# Patient Record
Sex: Male | Born: 1960 | Race: White | Hispanic: No | Smoking: Former smoker
Health system: Southern US, Community
[De-identification: ages and names within clinical notes are randomized; demographics above are authoritative.]

## PROBLEM LIST (undated history)

## (undated) DIAGNOSIS — Z9221 Personal history of antineoplastic chemotherapy: Secondary | ICD-10-CM

## (undated) DIAGNOSIS — J439 Emphysema, unspecified: Secondary | ICD-10-CM

## (undated) DIAGNOSIS — M1990 Unspecified osteoarthritis, unspecified site: Secondary | ICD-10-CM

## (undated) DIAGNOSIS — Z7183 Encounter for nonprocreative genetic counseling: Secondary | ICD-10-CM

## (undated) DIAGNOSIS — C801 Malignant (primary) neoplasm, unspecified: Secondary | ICD-10-CM

## (undated) DIAGNOSIS — J449 Chronic obstructive pulmonary disease, unspecified: Secondary | ICD-10-CM

## (undated) DIAGNOSIS — K219 Gastro-esophageal reflux disease without esophagitis: Secondary | ICD-10-CM

## (undated) DIAGNOSIS — R569 Unspecified convulsions: Secondary | ICD-10-CM

## (undated) DIAGNOSIS — G801 Spastic diplegic cerebral palsy: Secondary | ICD-10-CM

## (undated) DIAGNOSIS — J45909 Unspecified asthma, uncomplicated: Secondary | ICD-10-CM

## (undated) DIAGNOSIS — T7840XA Allergy, unspecified, initial encounter: Secondary | ICD-10-CM

## (undated) DIAGNOSIS — R1310 Dysphagia, unspecified: Secondary | ICD-10-CM

## (undated) DIAGNOSIS — J189 Pneumonia, unspecified organism: Secondary | ICD-10-CM

## (undated) DIAGNOSIS — R0600 Dyspnea, unspecified: Secondary | ICD-10-CM

## (undated) DIAGNOSIS — C01 Malignant neoplasm of base of tongue: Secondary | ICD-10-CM

## (undated) DIAGNOSIS — Z923 Personal history of irradiation: Secondary | ICD-10-CM

## (undated) DIAGNOSIS — G709 Myoneural disorder, unspecified: Secondary | ICD-10-CM

## (undated) DIAGNOSIS — I10 Essential (primary) hypertension: Secondary | ICD-10-CM

## (undated) HISTORY — DX: Personal history of antineoplastic chemotherapy: Z92.21

## (undated) HISTORY — DX: Essential (primary) hypertension: I10

## (undated) HISTORY — DX: Unspecified convulsions: R56.9

## (undated) HISTORY — DX: Allergy, unspecified, initial encounter: T78.40XA

## (undated) HISTORY — DX: Chronic obstructive pulmonary disease, unspecified: J44.9

## (undated) HISTORY — DX: Encounter for nonprocreative genetic counseling: Z71.83

## (undated) HISTORY — PX: JEJUNOSTOMY: SHX313

## (undated) HISTORY — DX: Personal history of irradiation: Z92.3

## (undated) HISTORY — DX: Spastic diplegic cerebral palsy: G80.1

## (undated) HISTORY — PX: UPPER GASTROINTESTINAL ENDOSCOPY: SHX188

## (undated) HISTORY — DX: Emphysema, unspecified: J43.9

## (undated) HISTORY — DX: Myoneural disorder, unspecified: G70.9

## (undated) HISTORY — DX: Unspecified asthma, uncomplicated: J45.909

## (undated) HISTORY — PX: COLONOSCOPY: SHX174

## (undated) HISTORY — PX: MOUTH SURGERY: SHX715

## (undated) HISTORY — DX: Unspecified osteoarthritis, unspecified site: M19.90

## (undated) HISTORY — PX: EYE SURGERY: SHX253

## (undated) HISTORY — DX: Malignant neoplasm of base of tongue: C01

## (undated) HISTORY — PX: POLYPECTOMY: SHX149

## (undated) MED FILL — Fosaprepitant Dimeglumine For IV Infusion 150 MG (Base Eq): INTRAVENOUS | Qty: 5 | Status: AC

---
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
## 2001-04-13 ENCOUNTER — Emergency Department (HOSPITAL_COMMUNITY): Admission: EM | Admit: 2001-04-13 | Discharge: 2001-04-13 | Payer: Self-pay | Admitting: *Deleted

## 2001-04-27 ENCOUNTER — Emergency Department (HOSPITAL_COMMUNITY): Admission: EM | Admit: 2001-04-27 | Discharge: 2001-04-27 | Payer: Self-pay | Admitting: Emergency Medicine

## 2012-11-01 ENCOUNTER — Encounter: Payer: Self-pay | Admitting: Family Medicine

## 2012-11-01 ENCOUNTER — Ambulatory Visit (INDEPENDENT_AMBULATORY_CARE_PROVIDER_SITE_OTHER): Payer: Medicaid Other | Admitting: Family Medicine

## 2012-11-01 VITALS — BP 130/80 | HR 78 | Temp 99.5°F | Resp 20 | Wt 145.0 lb

## 2012-11-01 DIAGNOSIS — R509 Fever, unspecified: Secondary | ICD-10-CM

## 2012-11-01 DIAGNOSIS — J45909 Unspecified asthma, uncomplicated: Secondary | ICD-10-CM

## 2012-11-01 DIAGNOSIS — J09X2 Influenza due to identified novel influenza A virus with other respiratory manifestations: Secondary | ICD-10-CM

## 2012-11-01 DIAGNOSIS — F17200 Nicotine dependence, unspecified, uncomplicated: Secondary | ICD-10-CM | POA: Insufficient documentation

## 2012-11-01 LAB — INFLUENZA A AND B: Inflenza A Ag: POSITIVE — AB

## 2012-11-01 MED ORDER — OSELTAMIVIR PHOSPHATE 75 MG PO CAPS: 75.0000 mg | ORAL_CAPSULE | Freq: Two times a day (BID) | ORAL | Status: DC

## 2012-11-01 MED ORDER — ALBUTEROL SULFATE HFA 108 (90 BASE) MCG/ACT IN AERS: 2.0000 | INHALATION_SPRAY | Freq: Four times a day (QID) | RESPIRATORY_TRACT | Status: DC | PRN

## 2012-11-01 NOTE — Progress Notes (Signed)
Subjective:     Patient ID: Darrell Cunningham, male   DOB: 24-Feb-1961, 52 y.o.   MRN: [id number]  HPI 6 day history of fever to 100 101, diffuse myalgias, cough, congestion, shortness of breath. He also reports rhinorrhea and headaches. He denies sore throat. He denies nausea vomiting or diarrhea.  Review of Systems  Constitutional: Positive for fever, chills, appetite change and fatigue.  HENT: Positive for congestion and rhinorrhea. Negative for ear pain.   Respiratory: Positive for cough, chest tightness, shortness of breath and wheezing.   Cardiovascular: Negative.   Gastrointestinal: Negative.   Genitourinary: Negative.        Objective:   Physical Exam  Constitutional: He appears well-developed and well-nourished.  HENT:  Head: Normocephalic.  Right Ear: External ear normal.  Left Ear: External ear normal.  Mouth/Throat: Oropharynx is clear and moist.  Neck: Normal range of motion. Neck supple.  Cardiovascular: Normal rate and normal heart sounds.   Pulmonary/Chest: Effort normal. No accessory muscle usage. Not tachypneic. No respiratory distress. He has decreased breath sounds in the right lower field and the left lower field. He has wheezes in the right upper field, the right middle field, the right lower field, the left upper field, the left middle field and the left lower field. He has rhonchi in the right lower field.       Assessment:     1. Fever, unspecified Please draw positive for influenza type A. - Influenza a and b  2. Influenza due to identified novel influenza A virus with other respiratory manifestations - oseltamivir (TAMIFLU) 75 MG capsule; Take 1 capsule (75 mg total) by mouth 2 (two) times daily.  Dispense: 10 capsule; Refill: 0  3. Reactive airway disease Encourage smoking cessation. His wheezing gets worse may need oral steroids. - albuterol (PROVENTIL HFA;VENTOLIN HFA) 108 (90 BASE) MCG/ACT inhaler; Inhale 2 puffs into the lungs every 6 (six)  hours as needed for wheezing.  Dispense: 1 Inhaler; Refill: 0    Plan:     Patient is instructed to return immediately if symptoms worsen due to concerns about evolving pneumonia. He may also need oral corticosteroids if his wheezing worsens.

## 2014-05-18 ENCOUNTER — Ambulatory Visit (INDEPENDENT_AMBULATORY_CARE_PROVIDER_SITE_OTHER): Payer: Medicaid Other | Admitting: Family Medicine

## 2014-05-18 ENCOUNTER — Encounter: Payer: Self-pay | Admitting: Family Medicine

## 2014-05-18 VITALS — BP 146/84 | HR 84 | Temp 97.6°F | Resp 18 | Ht 61.0 in | Wt 161.0 lb

## 2014-05-18 DIAGNOSIS — M545 Low back pain, unspecified: Secondary | ICD-10-CM

## 2014-05-18 MED ORDER — DICLOFENAC SODIUM 75 MG PO TBEC: 75.0000 mg | DELAYED_RELEASE_TABLET | Freq: Two times a day (BID) | ORAL | Status: DC

## 2014-05-18 MED ORDER — TIZANIDINE HCL 4 MG PO CAPS: 4.0000 mg | ORAL_CAPSULE | Freq: Three times a day (TID) | ORAL | Status: DC | PRN

## 2014-05-18 NOTE — Progress Notes (Signed)
   Subjective:    Patient ID: Darrell Cunningham, male    DOB: 01-02-1961, 53 y.o.   MRN: [id number]  HPI Patient is a pleasant 53 year old white male who injured his back 2 weeks ago while shoveling mulch.  He is located over his left lower back just above his gluteus. The pain does not radiate. It stays in that area. It is exacerbated by prolonged standing or walking. He denies any lumbar radiculopathy or weakness or numbness in his legs. He denies any symptoms of cauda equina syndrome. Past Medical History  Diagnosis Date  . Smoker   . Asthma     as a child   No past surgical history on file. Current Outpatient Prescriptions on File Prior to Visit  Medication Sig Dispense Refill  . ibuprofen (ADVIL,MOTRIN) 200 MG tablet Take 200 mg by mouth every 6 (six) hours as needed for pain.       No current facility-administered medications on file prior to visit.   Allergies  Allergen Reactions  . Penicillins     Childhood rxn   History   Social History  . Marital Status: Married    Spouse Name: N/A    Number of Children: N/A  . Years of Education: N/A   Occupational History  . Not on file.   Social History Main Topics  . Smoking status: Former Smoker    Types: Cigarettes    Start date: 10/31/2012  . Smokeless tobacco: Not on file  . Alcohol Use: No  . Drug Use: No  . Sexual Activity: Not on file   Other Topics Concern  . Not on file   Social History Narrative  . No narrative on file      Review of Systems  All other systems reviewed and are negative.      Objective:   Physical Exam  Vitals reviewed. Cardiovascular: Normal rate, regular rhythm and normal heart sounds.   Pulmonary/Chest: Effort normal and breath sounds normal.  Musculoskeletal:       Lumbar back: He exhibits tenderness, pain and spasm. He exhibits normal range of motion and no bony tenderness.  Neurological: He has normal strength.  Reflex Scores:      Patellar reflexes are 2+ on the right  side and 2+ on the left side.      Achilles reflexes are 2+ on the right side and 2+ on the left side.         Assessment & Plan:  Midline low back pain without sciatica - Plan: diclofenac (VOLTAREN) 75 MG EC tablet, tiZANidine (ZANAFLEX) 4 MG capsule  Patient has a muscle strain in his lower back. I anticipate spontaneous resolution in 2 more weeks. Take diclofenac 75 mg by mouth twice a day for inflammation, and Zanaflex 4 mg every 8 hours as needed for muscle spasms. Recheck in 2 weeks if no better or sooner if worse.

## 2014-06-17 ENCOUNTER — Other Ambulatory Visit: Payer: Medicaid Other

## 2014-06-17 ENCOUNTER — Other Ambulatory Visit: Payer: Self-pay | Admitting: Family Medicine

## 2014-06-17 DIAGNOSIS — Z0000 Encounter for general adult medical examination without abnormal findings: Secondary | ICD-10-CM

## 2014-06-17 LAB — COMPREHENSIVE METABOLIC PANEL
ALBUMIN: 4.3 g/dL (ref 3.5–5.2)
ALK PHOS: 69 U/L (ref 39–117)
ALT: 42 U/L (ref 0–53)
AST: 23 U/L (ref 0–37)
BUN: 12 mg/dL (ref 6–23)
CALCIUM: 9.2 mg/dL (ref 8.4–10.5)
CHLORIDE: 102 meq/L (ref 96–112)
CO2: 27 mEq/L (ref 19–32)
Creat: 0.84 mg/dL (ref 0.50–1.35)
Glucose, Bld: 121 mg/dL — ABNORMAL HIGH (ref 70–99)
POTASSIUM: 4.5 meq/L (ref 3.5–5.3)
SODIUM: 136 meq/L (ref 135–145)
TOTAL PROTEIN: 7.1 g/dL (ref 6.0–8.3)
Total Bilirubin: 0.4 mg/dL (ref 0.2–1.2)

## 2014-06-17 LAB — LIPID PANEL
Cholesterol: 237 mg/dL — ABNORMAL HIGH (ref 0–200)
HDL: 38 mg/dL — AB (ref >39)
LDL CALC: 168 mg/dL — AB (ref 0–99)
Total CHOL/HDL Ratio: 6.2 Ratio
Triglycerides: 153 mg/dL — ABNORMAL HIGH (ref <150)
VLDL: 31 mg/dL (ref 0–40)

## 2014-06-17 LAB — CBC WITH DIFFERENTIAL/PLATELET
BASOS ABS: 0 10*3/uL (ref 0.0–0.1)
BASOS PCT: 0 % (ref 0–1)
Eosinophils Absolute: 0.2 10*3/uL (ref 0.0–0.7)
Eosinophils Relative: 3 % (ref 0–5)
HCT: 40.7 % (ref 39.0–52.0)
Hemoglobin: 13.8 g/dL (ref 13.0–17.0)
LYMPHS PCT: 31 % (ref 12–46)
Lymphs Abs: 2 10*3/uL (ref 0.7–4.0)
MCH: 30.3 pg (ref 26.0–34.0)
MCHC: 33.9 g/dL (ref 30.0–36.0)
MCV: 89.5 fL (ref 78.0–100.0)
MONO ABS: 0.5 10*3/uL (ref 0.1–1.0)
MPV: 10.3 fL (ref 9.4–12.4)
Monocytes Relative: 8 % (ref 3–12)
NEUTROS ABS: 3.8 10*3/uL (ref 1.7–7.7)
NEUTROS PCT: 58 % (ref 43–77)
PLATELETS: 222 10*3/uL (ref 150–400)
RBC: 4.55 MIL/uL (ref 4.22–5.81)
RDW: 13.4 % (ref 11.5–15.5)
WBC: 6.6 10*3/uL (ref 4.0–10.5)

## 2014-06-18 LAB — PSA: PSA: 0.41 ng/mL (ref <=4.00)

## 2014-06-19 LAB — HEMOGLOBIN A1C
Hgb A1c MFr Bld: 5.8 % — ABNORMAL HIGH (ref <5.7)
MEAN PLASMA GLUCOSE: 120 mg/dL — AB (ref <117)

## 2014-06-29 ENCOUNTER — Ambulatory Visit (INDEPENDENT_AMBULATORY_CARE_PROVIDER_SITE_OTHER): Payer: Medicaid Other | Admitting: Family Medicine

## 2014-06-29 ENCOUNTER — Encounter: Payer: Self-pay | Admitting: Family Medicine

## 2014-06-29 VITALS — BP 126/70 | HR 84 | Temp 98.0°F | Resp 16 | Ht 61.0 in | Wt 163.0 lb

## 2014-06-29 DIAGNOSIS — R0609 Other forms of dyspnea: Secondary | ICD-10-CM

## 2014-06-29 DIAGNOSIS — Z0000 Encounter for general adult medical examination without abnormal findings: Secondary | ICD-10-CM

## 2014-06-29 DIAGNOSIS — Z23 Encounter for immunization: Secondary | ICD-10-CM

## 2014-06-29 DIAGNOSIS — R7303 Prediabetes: Secondary | ICD-10-CM | POA: Insufficient documentation

## 2014-06-29 MED ORDER — ATORVASTATIN CALCIUM 20 MG PO TABS: 20.0000 mg | ORAL_TABLET | Freq: Every day | ORAL | Status: DC

## 2014-06-29 NOTE — Addendum Note (Signed)
Addended by: Shary Decamp B on: 06/29/2014 02:54 PM   Modules accepted: Orders

## 2014-06-29 NOTE — Progress Notes (Signed)
Subjective:    Patient ID: Darrell Cunningham, male    DOB: 1960-08-17, 53 y.o.   MRN: [id number]  HPI Patient is a very pleasant 53 year old white male who presents today for complete physical exam. Patient has a history of smoking as well as asthma. He quit smoking in 2014. However he still complains of mild dyspnea on exertion. In the past, the patient had wheezing audible on exam. I'm concerned he may have COPD. Patient has never had an EKG. He denies any angina or chest pain. He is overdue for a colonoscopy as well as a prostate exam. He is due for his flu shot. His lab work as listed below and is significant for a hyperglycemia as well as hyperlipidemia: Lab on 06/17/2014  Component Date Value Ref Range Status  . WBC 06/17/2014 6.6  4.0 - 10.5 K/uL Final  . RBC 06/17/2014 4.55  4.22 - 5.81 MIL/uL Final  . Hemoglobin 06/17/2014 13.8  13.0 - 17.0 g/dL Final  . HCT 06/17/2014 40.7  39.0 - 52.0 % Final  . MCV 06/17/2014 89.5  78.0 - 100.0 fL Final  . MCH 06/17/2014 30.3  26.0 - 34.0 pg Final  . MCHC 06/17/2014 33.9  30.0 - 36.0 g/dL Final  . RDW 06/17/2014 13.4  11.5 - 15.5 % Final  . Platelets 06/17/2014 222  150 - 400 K/uL Final  . MPV 06/17/2014 10.3  9.4 - 12.4 fL Final  . Neutrophils Relative % 06/17/2014 58  43 - 77 % Final  . Neutro Abs 06/17/2014 3.8  1.7 - 7.7 K/uL Final  . Lymphocytes Relative 06/17/2014 31  12 - 46 % Final  . Lymphs Abs 06/17/2014 2.0  0.7 - 4.0 K/uL Final  . Monocytes Relative 06/17/2014 8  3 - 12 % Final  . Monocytes Absolute 06/17/2014 0.5  0.1 - 1.0 K/uL Final  . Eosinophils Relative 06/17/2014 3  0 - 5 % Final  . Eosinophils Absolute 06/17/2014 0.2  0.0 - 0.7 K/uL Final  . Basophils Relative 06/17/2014 0  0 - 1 % Final  . Basophils Absolute 06/17/2014 0.0  0.0 - 0.1 K/uL Final  . Smear Review 06/17/2014 Criteria for review not met   Final  . Sodium 06/17/2014 136  135 - 145 mEq/L Final  . Potassium 06/17/2014 4.5  3.5 - 5.3 mEq/L Final  . Chloride  06/17/2014 102  96 - 112 mEq/L Final  . CO2 06/17/2014 27  19 - 32 mEq/L Final  . Glucose, Bld 06/17/2014 121* 70 - 99 mg/dL Final  . BUN 06/17/2014 12  6 - 23 mg/dL Final  . Creat 06/17/2014 0.84  0.50 - 1.35 mg/dL Final  . Total Bilirubin 06/17/2014 0.4  0.2 - 1.2 mg/dL Final  . Alkaline Phosphatase 06/17/2014 69  39 - 117 U/L Final  . AST 06/17/2014 23  0 - 37 U/L Final  . ALT 06/17/2014 42  0 - 53 U/L Final  . Total Protein 06/17/2014 7.1  6.0 - 8.3 g/dL Final  . Albumin 06/17/2014 4.3  3.5 - 5.2 g/dL Final  . Calcium 06/17/2014 9.2  8.4 - 10.5 mg/dL Final  . Cholesterol 06/17/2014 237* 0 - 200 mg/dL Final   Comment: ATP III Classification:       < 200        mg/dL        Desirable      200 - 239     mg/dL        Borderline  High      >= 240        mg/dL        High     . Triglycerides 06/17/2014 153* <150 mg/dL Final  . HDL 06/17/2014 38* >39 mg/dL Final  . Total CHOL/HDL Ratio 06/17/2014 6.2   Final  . VLDL 06/17/2014 31  0 - 40 mg/dL Final  . LDL Cholesterol 06/17/2014 168* 0 - 99 mg/dL Final   Comment:   Total Cholesterol/HDL Ratio:CHD Risk                        Coronary Heart Disease Risk Table                                        Men       Women          1/2 Average Risk              3.4        3.3              Average Risk              5.0        4.4           2X Average Risk              9.6        7.1           3X Average Risk             23.4       11.0 Use the calculated Patient Ratio above and the CHD Risk table  to determine the patient's CHD Risk. ATP III Classification (LDL):       < 100        mg/dL         Optimal      100 - 129     mg/dL         Near or Above Optimal      130 - 159     mg/dL         Borderline High      160 - 189     mg/dL         High       > 190        mg/dL         Very High     . PSA 06/17/2014 0.41  <=4.00 ng/mL Final   Comment: Test Methodology: ECLIA PSA (Electrochemiluminescence Immunoassay)   For PSA values from 2.5-4.0,  particularly in younger men <76 years old, the AUA and NCCN suggest testing for % Free PSA (3515) and evaluation of the rate of increase in PSA (PSA velocity).   Orders Only on 06/17/2014  Component Date Value Ref Range Status  . Hgb A1c MFr Bld 06/17/2014 5.8* <5.7 % Final   Comment:                                                                        According to the  ADA Clinical Practice Recommendations for 2011, when HbA1c is used as a screening test:     >=6.5%   Diagnostic of Diabetes Mellitus            (if abnormal result is confirmed)   5.7-6.4%   Increased risk of developing Diabetes Mellitus   References:Diagnosis and Classification of Diabetes Mellitus,Diabetes GYKZ,9935,70(VXBLT 1):S62-S69 and Standards of Medical Care in         Diabetes - 2011,Diabetes JQZE,0923,30 (Suppl 1):S11-S61.     . Mean Plasma Glucose 06/17/2014 120* <117 mg/dL Final   Past Medical History  Diagnosis Date  . Smoker     quit 4/14  . Asthma     as a child  . CP (cerebral palsy), spastic     right  . Allergy   . Prediabetes   . Hyperlipidemia    No past surgical history on file. No current outpatient prescriptions on file prior to visit.   No current facility-administered medications on file prior to visit.   Allergies  Allergen Reactions  . Penicillins     Childhood rxn   History   Social History  . Marital Status: Married    Spouse Name: N/A    Number of Children: N/A  . Years of Education: N/A   Occupational History  . Not on file.   Social History Main Topics  . Smoking status: Former Smoker    Types: Cigarettes    Start date: 10/31/2012  . Smokeless tobacco: Not on file  . Alcohol Use: No  . Drug Use: No  . Sexual Activity: Not on file   Other Topics Concern  . Not on file   Social History Narrative   No family history on file.    Review of Systems  All other systems reviewed and are negative.      Objective:   Physical Exam  Constitutional:  He is oriented to person, place, and time. He appears well-developed and well-nourished. No distress.  HENT:  Head: Normocephalic and atraumatic.  Right Ear: External ear normal.  Left Ear: External ear normal.  Nose: Nose normal.  Mouth/Throat: Oropharynx is clear and moist. No oropharyngeal exudate.  Eyes: Conjunctivae and EOM are normal. Pupils are equal, round, and reactive to light. Right eye exhibits no discharge. Left eye exhibits no discharge. No scleral icterus.  Neck: Normal range of motion. Neck supple. No JVD present. No tracheal deviation present. No thyromegaly present.  Cardiovascular: Normal rate, regular rhythm, normal heart sounds and intact distal pulses.  Exam reveals no gallop and no friction rub.   No murmur heard. Pulmonary/Chest: Effort normal and breath sounds normal. No stridor.  Abdominal: Soft. Bowel sounds are normal. He exhibits no distension and no mass. There is no tenderness. There is no rebound and no guarding.  Musculoskeletal: Normal range of motion. He exhibits no edema or tenderness.  Lymphadenopathy:    He has no cervical adenopathy.  Neurological: He is alert and oriented to person, place, and time. He has normal reflexes. He displays normal reflexes. No cranial nerve deficit. He exhibits abnormal muscle tone. Coordination normal.  Skin: Skin is warm. No rash noted. He is not diaphoretic. No erythema. No pallor.  Psychiatric: He has a normal mood and affect. His behavior is normal. Judgment and thought content normal.  Vitals reviewed.  patient has right eye exotropia, and mild spastic weakness in his right upper extremity and right lower extremity due to his cerebral palsy  Assessment & Plan:  Routine general medical examination at a health care facility - Plan: Ambulatory referral to Gastroenterology  Dyspnea on exertion  Patient's physical exam is significant for his weight, his hyperglycemia, and his hyperlipidemia. I will perform an  EKG as well as pulmonary function test to evaluate his dyspnea on exertion. I recommended Lipitor 20 mg by mouth daily for hyperlipidemia. Recheck fasting lipid panel in 3 months. I recommended daily aerobic exercise goal 30 minutes a day as well as 10-15 pounds of weight loss to help address his hyperglycemia. I recommended a low carbohydrate diet. I will consult gastroenterology for screening colonoscopy. Patient received his flu shot today.  Pulmonary function tests reveal an FEV1 to FVC ratio of 76% with an FEV1 of 2.17 L which is 99% of predicted. This indicates normal spirometry with no evidence of COPD. EKG shows no evidence of ischemia or infarction. Patient is in normal sinus rhythm with normal intervals and normal axis. I gave the patient information on a low carbohydrate diet. I recommended exercise and diet to lose weight. Recheck cholesterol and lab work in 3 months.

## 2014-07-16 ENCOUNTER — Encounter: Payer: Self-pay | Admitting: Internal Medicine

## 2014-10-05 ENCOUNTER — Ambulatory Visit (AMBULATORY_SURGERY_CENTER): Payer: Self-pay

## 2014-10-05 VITALS — Ht 60.0 in | Wt 156.0 lb

## 2014-10-05 DIAGNOSIS — Z1211 Encounter for screening for malignant neoplasm of colon: Secondary | ICD-10-CM

## 2014-10-05 MED ORDER — MOVIPREP 100 G PO SOLR: 1.0000 | Freq: Once | ORAL | Status: DC

## 2014-10-05 NOTE — Progress Notes (Signed)
No allergies to eggs or soy No home oxygen No diet/weight loss meds No exposure to anesthesia  Has email  Emmi instructions given for colonoscopy

## 2014-10-19 ENCOUNTER — Encounter: Payer: Self-pay | Admitting: Internal Medicine

## 2014-10-19 ENCOUNTER — Ambulatory Visit (AMBULATORY_SURGERY_CENTER): Payer: Medicaid Other | Admitting: Internal Medicine

## 2014-10-19 VITALS — BP 112/72 | HR 70 | Temp 97.9°F | Resp 15 | Ht 60.0 in | Wt 156.0 lb

## 2014-10-19 DIAGNOSIS — D120 Benign neoplasm of cecum: Secondary | ICD-10-CM | POA: Diagnosis not present

## 2014-10-19 DIAGNOSIS — Z1211 Encounter for screening for malignant neoplasm of colon: Secondary | ICD-10-CM

## 2014-10-19 DIAGNOSIS — D124 Benign neoplasm of descending colon: Secondary | ICD-10-CM | POA: Diagnosis not present

## 2014-10-19 MED ORDER — SODIUM CHLORIDE 0.9 % IV SOLN: 500.0000 mL | INTRAVENOUS | Status: DC

## 2014-10-19 NOTE — Progress Notes (Signed)
A/ox3, pleased with MAC, report to RN 

## 2014-10-19 NOTE — Progress Notes (Signed)
Called to room to assist during endoscopic procedure.  Patient ID and intended procedure confirmed with present staff. Received instructions for my participation in the procedure from the performing physician.  

## 2014-10-19 NOTE — Op Note (Signed)
[place]  [address] [place], [phone]   COLONOSCOPY PROCEDURE REPORT  PATIENT: Darrell Cunningham, Darrell Cunningham  MR#: [id number] BIRTHDATE: 1961/01/21 , 53  yrs. old GENDER: male ENDOSCOPIST: [name], MD REFERRED MV:[id number] Dennard Schaumann, M.D. PROCEDURE DATE:  10/19/2014 PROCEDURE:   Colonoscopy with snare polypectomy First Screening Colonoscopy - Avg.  risk and is 50 yrs.  old or older Yes.  Prior Negative Screening - Now for repeat screening. N/A  History of Adenoma - Now for follow-up colonoscopy & has been > or = to 3 yrs.  N/A ASA CLASS:   Class II INDICATIONS:Screening for colonic neoplasia and Colorectal Neoplasm Risk Assessment for this procedure is average risk. MEDICATIONS: Monitored anesthesia care and Propofol 250 mg IV  DESCRIPTION OF PROCEDURE:   After the risks benefits and alternatives of the procedure were thoroughly explained, informed consent was obtained.  The digital rectal exam revealed no rectal mass.   The [id number]  endoscope was introduced through the anus and advanced to the terminal ileum which was intubated for a short distance. No adverse events experienced.   The quality of the prep was (MoviPrep was used) good.  The instrument was then slowly withdrawn as the colon was fully examined.   COLON FINDINGS: The examined terminal ileum appeared to be normal. Two sessile polyps ranging from 4 to 66mm in size were found at the cecum and in the descending colon.  Polypectomies were performed with a cold snare.  The resection was complete, the polyp tissue was completely retrieved and sent to histology.   There was mild diverticulosis noted in the left colon.  Retroflexed views revealed no abnormalities. The time to cecum = 4.3 Withdrawal time = 11.4 The scope was withdrawn and the procedure completed. COMPLICATIONS: There were no immediate complications.  ENDOSCOPIC IMPRESSION: 1.   The examined terminal ileum appeared to be  normal 2.   Two sessile polyps ranging from 4 to 52mm in size were found at the cecum and in the descending colon; polypectomies were performed with a cold snare 3.   There was mild diverticulosis noted in the left colon  RECOMMENDATIONS: 1.  Await pathology results 2.  High fiber diet 3.  If the polyps removed today are proven to be adenomatous (pre-cancerous) polyps, you will need a repeat colonoscopy in 5 years.  Otherwise you should continue to follow colorectal cancer screening guidelines for "routine risk" patients with colonoscopy in 10 years.  You will receive a letter within 1-2 weeks with the results of your biopsy as well as final recommendations.  Please call my office if you have not received a letter after 3 weeks.  eSigned:  [name], MD 10/19/2014 10:57 AM  cc: Margaretmary Eddy MD and The Patient

## 2014-10-19 NOTE — Patient Instructions (Signed)
Discharge instructions given. Handouts on polyps and diverticulosis. Resume previous medications. YOU HAD AN ENDOSCOPIC PROCEDURE TODAY AT THE  ENDOSCOPY CENTER:   Refer to the procedure report that was given to you for any specific questions about what was found during the examination.  If the procedure report does not answer your questions, please call your gastroenterologist to clarify.  If you requested that your care partner not be given the details of your procedure findings, then the procedure report has been included in a sealed envelope for you to review at your convenience later.  YOU SHOULD EXPECT: Some feelings of bloating in the abdomen. Passage of more gas than usual.  Walking can help get rid of the air that was put into your GI tract during the procedure and reduce the bloating. If you had a lower endoscopy (such as a colonoscopy or flexible sigmoidoscopy) you may notice spotting of blood in your stool or on the toilet paper. If you underwent a bowel prep for your procedure, you may not have a normal bowel movement for a few days.  Please Note:  You might notice some irritation and congestion in your nose or some drainage.  This is from the oxygen used during your procedure.  There is no need for concern and it should clear up in a day or so.  SYMPTOMS TO REPORT IMMEDIATELY:   Following lower endoscopy (colonoscopy or flexible sigmoidoscopy):  Excessive amounts of blood in the stool  Significant tenderness or worsening of abdominal pains  Swelling of the abdomen that is new, acute  Fever of 100F or higher   For urgent or emergent issues, a gastroenterologist can be reached at any hour by calling [phone].   DIET: Your first meal following the procedure should be a small meal and then it is ok to progress to your normal diet. Heavy or fried foods are harder to digest and may make you feel nauseous or bloated.  Likewise, meals heavy in dairy and vegetables can  increase bloating.  Drink plenty of fluids but you should avoid alcoholic beverages for 24 hours.  ACTIVITY:  You should plan to take it easy for the rest of today and you should NOT DRIVE or use heavy machinery until tomorrow (because of the sedation medicines used during the test).    FOLLOW UP: Our staff will call the number listed on your records the next business day following your procedure to check on you and address any questions or concerns that you may have regarding the information given to you following your procedure. If we do not reach you, we will leave a message.  However, if you are feeling well and you are not experiencing any problems, there is no need to return our call.  We will assume that you have returned to your regular daily activities without incident.  If any biopsies were taken you will be contacted by phone or by letter within the next 1-3 weeks.  Please call us at [phone] if you have not heard about the biopsies in 3 weeks.    SIGNATURES/CONFIDENTIALITY: You and/or your care partner have signed paperwork which will be entered into your electronic medical record.  These signatures attest to the fact that that the information above on your After Visit Summary has been reviewed and is understood.  Full responsibility of the confidentiality of this discharge information lies with you and/or your care-partner. 

## 2014-10-20 ENCOUNTER — Telehealth: Payer: Self-pay | Admitting: *Deleted

## 2014-10-20 NOTE — Telephone Encounter (Signed)
  Follow up Call-  Call back number 10/19/2014  Post procedure Call Back phone  # [phone]  Permission to leave phone message Yes     Patient questions:  Number has been disconnected.

## 2014-10-22 ENCOUNTER — Encounter: Payer: Self-pay | Admitting: Internal Medicine

## 2014-10-27 ENCOUNTER — Encounter: Payer: Self-pay | Admitting: Family Medicine

## 2014-10-27 ENCOUNTER — Ambulatory Visit (INDEPENDENT_AMBULATORY_CARE_PROVIDER_SITE_OTHER): Payer: Medicaid Other | Admitting: Family Medicine

## 2014-10-27 VITALS — BP 142/88 | HR 60 | Temp 97.9°F | Resp 14 | Ht 60.0 in | Wt 156.0 lb

## 2014-10-27 DIAGNOSIS — R7309 Other abnormal glucose: Secondary | ICD-10-CM

## 2014-10-27 DIAGNOSIS — R7303 Prediabetes: Secondary | ICD-10-CM

## 2014-10-27 DIAGNOSIS — E785 Hyperlipidemia, unspecified: Secondary | ICD-10-CM

## 2014-10-27 DIAGNOSIS — R030 Elevated blood-pressure reading, without diagnosis of hypertension: Secondary | ICD-10-CM | POA: Diagnosis not present

## 2014-10-27 LAB — COMPLETE METABOLIC PANEL WITH GFR
ALT: 27 U/L (ref 0–53)
AST: 20 U/L (ref 0–37)
Albumin: 4.3 g/dL (ref 3.5–5.2)
Alkaline Phosphatase: 77 U/L (ref 39–117)
BUN: 12 mg/dL (ref 6–23)
CO2: 24 meq/L (ref 19–32)
Calcium: 9.3 mg/dL (ref 8.4–10.5)
Chloride: 106 mEq/L (ref 96–112)
Creat: 0.61 mg/dL (ref 0.50–1.35)
GFR, Est African American: >89 mL/min
GFR, Est Non African American: >89 mL/min
Glucose, Bld: 110 mg/dL — ABNORMAL HIGH (ref 70–99)
Potassium: 4.1 mEq/L (ref 3.5–5.3)
SODIUM: 136 meq/L (ref 135–145)
Total Bilirubin: 0.4 mg/dL (ref 0.2–1.2)
Total Protein: 7.1 g/dL (ref 6.0–8.3)

## 2014-10-27 LAB — LIPID PANEL
Cholesterol: 157 mg/dL (ref 0–200)
HDL: 34 mg/dL — ABNORMAL LOW (ref >=40)
LDL CALC: 106 mg/dL — AB (ref 0–99)
Total CHOL/HDL Ratio: 4.6 Ratio
Triglycerides: 83 mg/dL (ref <150)
VLDL: 17 mg/dL (ref 0–40)

## 2014-10-27 LAB — HEMOGLOBIN A1C
Hgb A1c MFr Bld: 5.7 % — ABNORMAL HIGH (ref <5.7)
Mean Plasma Glucose: 117 mg/dL — ABNORMAL HIGH (ref <117)

## 2014-10-27 NOTE — Progress Notes (Signed)
Subjective:    Patient ID: Darrell Cunningham, male    DOB: 09/19/1960, 54 y.o.   MRN: [id number]  HPI Patient was seen in November. At that time he was found to have borderline diabetes with a fasting blood sugar of 121. He was also found to have hyperlipidemia with an LDL cholesterol greater than 160. At that time I started the patient on Lipitor 20 mg by mouth daily. He is here today to recheck a fasting lipid panel. He denies any myalgias or right upper quadrant pain. He has lost 7 pounds since his last office visit. He is trying to eat a low carbohydrate diet. Unfortunate his blood pressure is borderline elevated today at 142/88. He denies any chest pain shortness of breath or dyspnea on exertion. He continues to refrain from smoking. Past Medical History  Diagnosis Date  . Smoker     quit 4/14  . Asthma     as a child  . CP (cerebral palsy), spastic     right  . Allergy   . Prediabetes   . Hyperlipidemia    Past Surgical History  Procedure Laterality Date  . Mouth surgery     Current Outpatient Prescriptions on File Prior to Visit  Medication Sig Dispense Refill  . atorvastatin (LIPITOR) 20 MG tablet Take 1 tablet (20 mg total) by mouth daily. 90 tablet 3   No current facility-administered medications on file prior to visit.   Allergies  Allergen Reactions  . Penicillins     Childhood rxn   History   Social History  . Marital Status: Married    Spouse Name: N/A  . Number of Children: N/A  . Years of Education: N/A   Occupational History  . Not on file.   Social History Main Topics  . Smoking status: Former Smoker    Types: Cigarettes    Start date: 10/31/2012  . Smokeless tobacco: Never Used  . Alcohol Use: No  . Drug Use: No  . Sexual Activity: Not on file   Other Topics Concern  . Not on file   Social History Narrative      Review of Systems  All other systems reviewed and are negative.      Objective:   Physical Exam  Constitutional: He  appears well-developed and well-nourished.  Neck: Neck supple. No JVD present. No thyromegaly present.  Cardiovascular: Normal rate, regular rhythm, normal heart sounds and intact distal pulses.  Exam reveals no gallop and no friction rub.   No murmur heard. Pulmonary/Chest: Effort normal and breath sounds normal. No respiratory distress. He has no wheezes. He has no rales.  Abdominal: Soft. Bowel sounds are normal. He exhibits no distension and no mass. There is no tenderness. There is no rebound and no guarding.  Musculoskeletal: He exhibits no edema.  Lymphadenopathy:    He has no cervical adenopathy.  Vitals reviewed.         Assessment & Plan:  HLD (hyperlipidemia) - Plan: COMPLETE METABOLIC PANEL WITH GFR, Hemoglobin A1c, Lipid panel  Prediabetes  Borderline systolic HTN  Patient's blood pressure is borderline elevated. I have asked the patient check his blood pressure frequently at home and notify me of the values in one month. I will check a fasting lipid panel. Ideally I would like his fasting blood sugar to be below 100 given his prediabetic state. I will also check a hemoglobin A1c and a fasting blood sugar. I have encouraged the patient to continue his weight loss  efforts and to continue to consume a low carbohydrate diet.

## 2014-10-29 ENCOUNTER — Encounter: Payer: Self-pay | Admitting: Family Medicine

## 2015-06-22 ENCOUNTER — Ambulatory Visit (INDEPENDENT_AMBULATORY_CARE_PROVIDER_SITE_OTHER): Payer: Medicaid Other | Admitting: Family Medicine

## 2015-06-22 DIAGNOSIS — Z23 Encounter for immunization: Secondary | ICD-10-CM | POA: Diagnosis not present

## 2015-07-14 ENCOUNTER — Ambulatory Visit (INDEPENDENT_AMBULATORY_CARE_PROVIDER_SITE_OTHER): Payer: Medicaid Other | Admitting: Physician Assistant

## 2015-07-14 ENCOUNTER — Encounter: Payer: Self-pay | Admitting: Physician Assistant

## 2015-07-14 VITALS — BP 112/70 | HR 100 | Temp 99.0°F | Resp 18 | Wt 159.0 lb

## 2015-07-14 DIAGNOSIS — B9689 Other specified bacterial agents as the cause of diseases classified elsewhere: Principal | ICD-10-CM

## 2015-07-14 DIAGNOSIS — J441 Chronic obstructive pulmonary disease with (acute) exacerbation: Secondary | ICD-10-CM | POA: Diagnosis not present

## 2015-07-14 DIAGNOSIS — J988 Other specified respiratory disorders: Secondary | ICD-10-CM | POA: Diagnosis not present

## 2015-07-14 DIAGNOSIS — J439 Emphysema, unspecified: Secondary | ICD-10-CM | POA: Insufficient documentation

## 2015-07-14 DIAGNOSIS — J4521 Mild intermittent asthma with (acute) exacerbation: Secondary | ICD-10-CM

## 2015-07-14 MED ORDER — AZITHROMYCIN 250 MG PO TABS: ORAL_TABLET | ORAL | Status: DC

## 2015-07-14 MED ORDER — ALBUTEROL SULFATE HFA 108 (90 BASE) MCG/ACT IN AERS: 2.0000 | INHALATION_SPRAY | Freq: Four times a day (QID) | RESPIRATORY_TRACT | Status: DC | PRN

## 2015-07-14 MED ORDER — PREDNISONE 20 MG PO TABS: ORAL_TABLET | ORAL | Status: DC

## 2015-07-14 NOTE — Progress Notes (Signed)
Patient ID: ANGELJESUS SYMONDS MRN: [id number], DOB: [birth year], 54 y.o. Date of Encounter: 07/14/2015, 9:01 AM    Chief Complaint:  Chief Complaint  Patient presents with  . sick x 1 day    chest cong, cough  kept up all  night  OTC no help     HPI: 54 y.o. year old white male presents with above. Disease also having a little bit of congestion in his head and nose and getting a little bit of mucus from his nose. Is that he has also been checking his temperature at home has been reading normal. Says that he lives alone and does not work regular job. Says he has not been around any known sick contacts. Has been using Mucinex Robitussin and nasal spray.     Home Meds:   Outpatient Prescriptions Prior to Visit  Medication Sig Dispense Refill  . atorvastatin (LIPITOR) 20 MG tablet Take 1 tablet (20 mg total) by mouth daily. 90 tablet 3   No facility-administered medications prior to visit.    Allergies:  Allergies  Allergen Reactions  . Penicillins     Childhood rxn      Review of Systems: See HPI for pertinent ROS. All other ROS negative.    Physical Exam: Blood pressure 112/70, pulse 100, temperature 99 F (37.2 C), temperature source Oral, resp. rate 18, weight 159 lb (72.122 kg)., Body mass index is 31.05 kg/(m^2).  SaO2 on RA: 95% General:  WNWD WM. (Has mild cerebral palsy) Appears in no acute distress. HEENT: Normocephalic, atraumatic, eyes without discharge, sclera non-icteric, nares are without discharge. Bilateral auditory canals clear, TM's are without perforation, pearly grey and translucent with reflective cone of light bilaterally. Oral cavity moist, posterior pharynx without exudate, erythema, peritonsillar abscess.  Neck: Supple. No thyromegaly. No lymphadenopathy. Lungs: Mild to moderate wheezes throughout bilaterally. Oxygen saturation 95% on room air. Heart: Regular rhythm. No murmurs, rubs, or gallops. Msk:  Strength and tone normal for  age. Extremities/Skin: Warm and dry.  Neuro: Alert and oriented X 3. Moves all extremities spontaneously. Gait is normal. CNII-XII grossly in tact. Psych:  Responds to questions appropriately with a normal affect.     ASSESSMENT AND PLAN:  54 y.o. year old male with  1. COPD exacerbation ([place])  He says that he quit smoking 2-1/2 years ago. He had started smoking at age 54 and had smoked for approximately 40 years. Told him to go directly to the pharmacy get medications and start them immediately. He is to take the azithromycin and Prednisone as directed. For the albuterol inhaler told him to go ahead and do 2 puffs every 4-6 hours routinely for these 5 days then he can go to PRN dosing. If symptoms worsen follow-up immediately. Otherwise follow-up if symptoms do not resolve within 1 week of completion of oral meds. - azithromycin (ZITHROMAX) 250 MG tablet; Day 1: Take 2 daily. Days 2-5: Take 1 daily.  Dispense: 6 tablet; Refill: 0 - albuterol (PROVENTIL HFA;VENTOLIN HFA) 108 (90 BASE) MCG/ACT inhaler; Inhale 2 puffs into the lungs every 6 (six) hours as needed for wheezing or shortness of breath.  Dispense: 1 Inhaler; Refill: 0 - predniSONE (DELTASONE) 20 MG tablet; Take 2 daily for 5 days  Dispense: 10 tablet; Refill: 0  2. Pulmonary emphysema, unspecified emphysema type ([place]) - azithromycin (ZITHROMAX) 250 MG tablet; Day 1: Take 2 daily. Days 2-5: Take 1 daily.  Dispense: 6 tablet; Refill: 0 - albuterol (PROVENTIL HFA;VENTOLIN HFA) 108 (90 BASE)  MCG/ACT inhaler; Inhale 2 puffs into the lungs every 6 (six) hours as needed for wheezing or shortness of breath.  Dispense: 1 Inhaler; Refill: 0 - predniSONE (DELTASONE) 20 MG tablet; Take 2 daily for 5 days  Dispense: 10 tablet; Refill: 0  3. Asthma, mild intermittent, with acute exacerbation - azithromycin (ZITHROMAX) 250 MG tablet; Day 1: Take 2 daily. Days 2-5: Take 1 daily.  Dispense: 6 tablet; Refill: 0 - albuterol (PROVENTIL HFA;VENTOLIN  HFA) 108 (90 BASE) MCG/ACT inhaler; Inhale 2 puffs into the lungs every 6 (six) hours as needed for wheezing or shortness of breath.  Dispense: 1 Inhaler; Refill: 0 - predniSONE (DELTASONE) 20 MG tablet; Take 2 daily for 5 days  Dispense: 10 tablet; Refill: 0  4. Bacterial respiratory infection - azithromycin (ZITHROMAX) 250 MG tablet; Day 1: Take 2 daily. Days 2-5: Take 1 daily.  Dispense: 6 tablet; Refill: 0 - albuterol (PROVENTIL HFA;VENTOLIN HFA) 108 (90 BASE) MCG/ACT inhaler; Inhale 2 puffs into the lungs every 6 (six) hours as needed for wheezing or shortness of breath.  Dispense: 1 Inhaler; Refill: 0 - predniSONE (DELTASONE) 20 MG tablet; Take 2 daily for 5 days  Dispense: 10 tablet; Refill: 0   Signed, [address] [place], [place], [facility] 07/14/2015 9:01 AM

## 2015-09-13 ENCOUNTER — Encounter: Payer: Self-pay | Admitting: Family Medicine

## 2015-09-13 ENCOUNTER — Ambulatory Visit (INDEPENDENT_AMBULATORY_CARE_PROVIDER_SITE_OTHER): Payer: Medicaid Other | Admitting: Family Medicine

## 2015-09-13 VITALS — BP 144/82 | HR 86 | Temp 98.0°F | Resp 18 | Ht 60.0 in | Wt 163.0 lb

## 2015-09-13 DIAGNOSIS — S76312A Strain of muscle, fascia and tendon of the posterior muscle group at thigh level, left thigh, initial encounter: Secondary | ICD-10-CM | POA: Diagnosis not present

## 2015-09-13 MED ORDER — HYDROCODONE-ACETAMINOPHEN 5-325 MG PO TABS: 1.0000 | ORAL_TABLET | Freq: Four times a day (QID) | ORAL | Status: DC | PRN

## 2015-09-13 MED ORDER — PREDNISONE 20 MG PO TABS: ORAL_TABLET | ORAL | Status: DC

## 2015-09-13 MED ORDER — TIZANIDINE HCL 4 MG PO TABS: 4.0000 mg | ORAL_TABLET | Freq: Four times a day (QID) | ORAL | Status: DC | PRN

## 2015-09-13 NOTE — Progress Notes (Signed)
   Subjective:    Patient ID: Darrell Cunningham, male    DOB: Dec 22, 1960, 55 y.o.   MRN: [id number]  HPI This weekend, the patient injured his left leg while splitting firewood. He reports severe pain and stiffness at the distal portion of his left hamstring. He has a difficult time walking. He reports muscle spasms in the hamstring. He is tender to palpation over the distal hamstring. There is no palpable muscle deformity in that area. However the pain is exacerbated dramatically with resisted flexion of his left knee. He also has pain with resisted extension of his left hip. He has a negative Apley grind sign on the left. There is no Baker's cyst. There is no tenderness to palpation along the joint line. He has negative straight leg raise other than the stiffness and tightness in his left hamstring. He denies any back pain or sciatica type symptoms. Past Medical History  Diagnosis Date  . Smoker     quit 4/14  . Asthma     as a child  . 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 Allergy   . Prediabetes   . Hyperlipidemia    Past Surgical History  Procedure Laterality Date  . Mouth surgery     Current Outpatient Prescriptions on File Prior to Visit  Medication Sig Dispense Refill  . albuterol (PROVENTIL HFA;VENTOLIN HFA) 108 (90 BASE) MCG/ACT inhaler Inhale 2 puffs into the lungs every 6 (six) hours as needed for wheezing or shortness of breath. 1 Inhaler 0  . atorvastatin (LIPITOR) 20 MG tablet Take 1 tablet (20 mg total) by mouth daily. 90 tablet 3   No current facility-administered medications on file prior to visit.   Allergies  Allergen Reactions  . Penicillins     Childhood rxn   Social History   Social History  . Marital Status: Married    Spouse Name: N/A  . Number of Children: N/A  . Years of Education: N/A   Occupational History  . Not on file.   Social History Main Topics  . Smoking status: Former Smoker    Types: Cigarettes    Start date: 10/31/2012    . Smokeless tobacco: Never Used  . Alcohol Use: No  . Drug Use: No  . Sexual Activity: Not on file   Other Topics Concern  . Not on file   Social History Narrative      Review of Systems  All other systems reviewed and are negative.      Objective:   Physical Exam  Constitutional: He appears well-developed and well-nourished.  Cardiovascular: Normal rate, regular rhythm and normal heart sounds.   Pulmonary/Chest: Effort normal and breath sounds normal.  Musculoskeletal:       Left upper leg: He exhibits tenderness and swelling. He exhibits no bony tenderness and no deformity.  Vitals reviewed.         Assessment & Plan:  Hamstring strain, left, initial encounter - Plan: predniSONE (DELTASONE) 20 MG tablet, tiZANidine (ZANAFLEX) 4 MG tablet, HYDROcodone-acetaminophen (NORCO) 5-325 MG tablet  I believe the patient partially strained his left hamstring. I recommended heat, rest, elevation. Prednisone taper pack for inflammation. [place] Zanaflex 4 mg every 8 hours for muscle spasms and Norco one pill every 6 hours as needed for pain. Recheck here in 2 weeks if no better or sooner if worse

## 2015-11-18 ENCOUNTER — Other Ambulatory Visit: Payer: Self-pay | Admitting: Family Medicine

## 2015-11-19 ENCOUNTER — Telehealth: Payer: Self-pay | Admitting: Family Medicine

## 2015-11-19 MED ORDER — MELOXICAM 15 MG PO TABS: 15.0000 mg | ORAL_TABLET | Freq: Every day | ORAL | Status: DC

## 2015-11-19 NOTE — Telephone Encounter (Signed)
Patient is aware of the different medication that will be called in and I also told him you wouldn't refill pain medication. He didn't want to make an appt at this time

## 2015-11-19 NOTE — Telephone Encounter (Signed)
Patient would like to know if he has received an approval on his diclofenac (VOLTAREN) 75 MG EC tablet he said the pharmacy is telling him it needs an approval.  He would also like his HYDROcodone-acetaminophen ([place]) 5-325 MG   Cb# [id number]

## 2015-11-19 NOTE — Telephone Encounter (Signed)
OK with mobic 15 mg poqday.  I saw him 2 months ago, if still needing narcotics for leg pain, he NTBS.

## 2015-11-19 NOTE — Telephone Encounter (Signed)
Med sent to pharm 

## 2015-11-19 NOTE — Telephone Encounter (Signed)
Ok to refill??      And ins will cover mobic - ok to change?

## 2016-03-17 ENCOUNTER — Other Ambulatory Visit: Payer: Self-pay | Admitting: Physician Assistant

## 2016-03-17 DIAGNOSIS — J988 Other specified respiratory disorders: Secondary | ICD-10-CM

## 2016-03-17 DIAGNOSIS — J439 Emphysema, unspecified: Secondary | ICD-10-CM

## 2016-03-17 DIAGNOSIS — B9689 Other specified bacterial agents as the cause of diseases classified elsewhere: Secondary | ICD-10-CM

## 2016-03-17 DIAGNOSIS — J4521 Mild intermittent asthma with (acute) exacerbation: Secondary | ICD-10-CM

## 2016-03-17 DIAGNOSIS — J441 Chronic obstructive pulmonary disease with (acute) exacerbation: Secondary | ICD-10-CM

## 2016-03-17 NOTE — Telephone Encounter (Signed)
Medication refilled per protocol. 

## 2016-06-02 ENCOUNTER — Ambulatory Visit (INDEPENDENT_AMBULATORY_CARE_PROVIDER_SITE_OTHER): Payer: Medicaid Other | Admitting: *Deleted

## 2016-06-02 DIAGNOSIS — Z23 Encounter for immunization: Secondary | ICD-10-CM | POA: Diagnosis not present

## 2016-06-02 NOTE — Progress Notes (Signed)
Patient seen in office for Influenza Vaccination.   Tolerated IM administration well.   Immunization history updated.  

## 2016-06-30 ENCOUNTER — Other Ambulatory Visit: Payer: Self-pay | Admitting: Family Medicine

## 2016-09-21 ENCOUNTER — Emergency Department (HOSPITAL_COMMUNITY)
Admission: EM | Admit: 2016-09-21 | Discharge: 2016-09-22 | Disposition: A | Discharge status: Home / Self Care | Payer: Medicaid Other | Attending: Emergency Medicine | Admitting: Emergency Medicine

## 2016-09-21 ENCOUNTER — Encounter (HOSPITAL_COMMUNITY): Payer: Self-pay | Admitting: *Deleted

## 2016-09-21 ENCOUNTER — Ambulatory Visit (INDEPENDENT_AMBULATORY_CARE_PROVIDER_SITE_OTHER): Payer: Medicaid Other | Admitting: Family Medicine

## 2016-09-21 ENCOUNTER — Encounter: Payer: Self-pay | Admitting: Family Medicine

## 2016-09-21 VITALS — BP 118/80 | HR 76 | Temp 98.1°F | Resp 18 | Ht 60.0 in | Wt 148.0 lb

## 2016-09-21 DIAGNOSIS — K219 Gastro-esophageal reflux disease without esophagitis: Secondary | ICD-10-CM | POA: Diagnosis not present

## 2016-09-21 DIAGNOSIS — J449 Chronic obstructive pulmonary disease, unspecified: Secondary | ICD-10-CM | POA: Diagnosis not present

## 2016-09-21 DIAGNOSIS — J45909 Unspecified asthma, uncomplicated: Secondary | ICD-10-CM | POA: Insufficient documentation

## 2016-09-21 DIAGNOSIS — K2100 Gastro-esophageal reflux disease with esophagitis, without bleeding: Secondary | ICD-10-CM

## 2016-09-21 DIAGNOSIS — K222 Esophageal obstruction: Secondary | ICD-10-CM | POA: Diagnosis not present

## 2016-09-21 DIAGNOSIS — Z79899 Other long term (current) drug therapy: Secondary | ICD-10-CM | POA: Diagnosis not present

## 2016-09-21 DIAGNOSIS — Z87891 Personal history of nicotine dependence: Secondary | ICD-10-CM | POA: Insufficient documentation

## 2016-09-21 DIAGNOSIS — R1310 Dysphagia, unspecified: Secondary | ICD-10-CM | POA: Diagnosis present

## 2016-09-21 LAB — COMPLETE METABOLIC PANEL WITH GFR
ALBUMIN: 4.2 g/dL (ref 3.6–5.1)
ALK PHOS: 81 U/L (ref 40–115)
ALT: 26 U/L (ref 9–46)
AST: 18 U/L (ref 10–35)
BUN: 14 mg/dL (ref 7–25)
CALCIUM: 9.3 mg/dL (ref 8.6–10.3)
CHLORIDE: 105 mmol/L (ref 98–110)
CO2: 26 mmol/L (ref 20–31)
CREATININE: 0.72 mg/dL (ref 0.70–1.33)
GFR, Est Non African American: >89 mL/min (ref >=60)
Glucose, Bld: 95 mg/dL (ref 70–99)
POTASSIUM: 4.2 mmol/L (ref 3.5–5.3)
Sodium: 140 mmol/L (ref 135–146)
Total Bilirubin: 0.4 mg/dL (ref 0.2–1.2)
Total Protein: 7.1 g/dL (ref 6.1–8.1)

## 2016-09-21 LAB — CBC WITH DIFFERENTIAL/PLATELET
BASOS ABS: 0 {cells}/uL (ref 0–200)
Basophils Relative: 0 %
EOS ABS: 198 {cells}/uL (ref 15–500)
Eosinophils Relative: 3 %
HEMATOCRIT: 41.2 % (ref 38.5–50.0)
HEMOGLOBIN: 13.4 g/dL (ref 13.0–17.0)
LYMPHS ABS: 1782 {cells}/uL (ref 850–3900)
LYMPHS PCT: 27 %
MCH: 28.1 pg (ref 27.0–33.0)
MCHC: 32.5 g/dL (ref 32.0–36.0)
MCV: 86.4 fL (ref 80.0–100.0)
MONO ABS: 462 {cells}/uL (ref 200–950)
MPV: 9.6 fL (ref 7.5–12.5)
Monocytes Relative: 7 %
NEUTROS PCT: 63 %
Neutro Abs: 4158 cells/uL (ref 1500–7800)
Platelets: 215 10*3/uL (ref 140–400)
RBC: 4.77 MIL/uL (ref 4.20–5.80)
RDW: 14.5 % (ref 11.0–15.0)
WBC: 6.6 10*3/uL (ref 3.8–10.8)

## 2016-09-21 MED ORDER — FAMOTIDINE IN NACL 20-0.9 MG/50ML-% IV SOLN
20.0000 mg | Freq: Once | INTRAVENOUS | Status: AC
  Administered 2016-09-22: 20 mg via INTRAVENOUS
  Filled 2016-09-21: qty 50

## 2016-09-21 MED ORDER — PANTOPRAZOLE SODIUM 40 MG PO TBEC: 40.0000 mg | DELAYED_RELEASE_TABLET | Freq: Two times a day (BID) | ORAL | 3 refills | Status: DC

## 2016-09-21 MED ORDER — SODIUM CHLORIDE 0.9 % IV BOLUS (SEPSIS)
1000.0000 mL | Freq: Once | INTRAVENOUS | Status: AC
  Administered 2016-09-22: 1000 mL via INTRAVENOUS

## 2016-09-21 NOTE — ED Provider Notes (Signed)
[facility] DEPT Provider Note   CSN: [id number] Arrival date & time: 09/21/16  2235   By signing my name below, I, Hilbert Odor, attest that this documentation has been prepared under the direction and in the presence of Rolland Porter, MD. Electronically Signed: Hilbert Odor, Scribe. 09/21/16. 1:09 AM.  Time seen 23:35 PM  History   Chief Complaint Chief Complaint  Patient presents with  . Hematemesis    The history is provided by the patient. No language interpreter was used.  HPI Comments: Darrell Cunningham is a 56 y.o. male who presents to the Emergency Department complaining of hematemesis that occurred around 8:30 pm tonight. He states that the pain worsens when laying down. The patient states that for the past 3 weeks he has been regurgitating food or anything that he swallows throughout the day. He states that he has been trying to stay away from foods with citric acid, coffee or carbonation. He reports it's painful to swallow. He also reports difficulty swallowing liquids and solid foods. He states that when he swallows it feels like food gets stuck in his throat.  He reports he has to stand up and hold his chin up in the air to help things past. He reports a loss of 20 pounds over the past 3 weeks due to this issue. He states that he was seen by his PCP on 09/21/2016 for his acid reflux and was given Protonix at that time. He has taken 2 doses of the Protonix today. Later that day he states that he began spitting up blood. He reports dark tablespoon of blood at a time area he estimates he has done that about 40 times.. He states that he had never had problems like this in the past. The patient also states that he has been having LUQ achy abdominal pains recently. He denies hx of abdominal problems. He states that he hasn't smoked in 3 years. He denies drinking EtOH. He has had a mild dry cough but denies symptoms of aspirating the acid fluid that refluxes back up into his throat.  He denies nausea or vomiting. He denies fever. He denies rectal bleeding, he states his bowel movements are green. Dr Dennard Schaumann is going to refer him to GI. He was also told to stop his meloxicam.   PCP Odette Fraction, MD   Past Medical History:  Diagnosis Date  . Allergy   . Asthma    as a child  . CP (cerebral palsy), spastic ([place])    right  . Hyperlipidemia   . Prediabetes   . Smoker    quit 4/14    Patient Active Problem List   Diagnosis Date Noted  . COPD (chronic obstructive pulmonary disease) with emphysema ([place]) 07/14/2015  . Prediabetes   . Asthma     Past Surgical History:  Procedure Laterality Date  . MOUTH SURGERY         Home Medications    Prior to Admission medications   Medication Sig Start Date End Date Taking? Authorizing Provider  ibuprofen (ADVIL,MOTRIN) 200 MG tablet Take 400 mg by mouth at bedtime.   Yes Historical Provider, MD  pantoprazole (PROTONIX) 40 MG tablet Take 1 tablet (40 mg total) by mouth 2 (two) times daily. 09/21/16  Yes [name], MD  PROAIR HFA 108 [phone] Base) MCG/ACT inhaler INHALE 2 PUFFS INTO THE LUNGS EVERY 6 (SIX) HOURS AS NEEDED FOR WHEEZING OR SHORTNESS OF BREATH. 03/17/16  Yes [name], MD  meloxicam [facility])  15 MG tablet TAKE 1 TABLET (15 MG TOTAL) BY MOUTH DAILY. Patient not taking: Reported on 09/21/2016 06/30/16   [name], MD  sucralfate (CARAFATE) 1 GM/10ML suspension Take 10 mLs (1 g total) by mouth 4 (four) times daily -  with meals and at bedtime. 09/22/16   Rolland Porter, MD  tiZANidine (ZANAFLEX) 4 MG tablet TAKE 1 TABLET (4 MG TOTAL) BY MOUTH EVERY 6 (SIX) HOURS AS NEEDED FOR MUSCLE SPASMS. Patient not taking: Reported on 09/21/2016 11/18/15   [name], MD    Family History Family History  Problem Relation Age of Onset  . Colon cancer Maternal Grandmother     Social History Social History  Substance Use Topics  . Smoking status: Former Smoker    Types: Cigarettes    Start date:  10/31/2012  . Smokeless tobacco: Never Used  . Alcohol use No  on disabililty for cerebral palsy on his right side   Allergies   Penicillins   Review of Systems Review of Systems  Constitutional: Negative for fever.  HENT: Positive for trouble swallowing.   Cardiovascular: Negative for chest pain.  Gastrointestinal: Positive for abdominal pain. Negative for constipation and diarrhea.  All other systems reviewed and are negative.    Physical Exam Updated Vital Signs BP 130/73 (BP Location: Left Arm)   Pulse 93   Temp 99.1 F (37.3 C) (Oral)   Resp 18   Ht 5' (1.524 m)   Wt 148 lb (67.1 kg)   SpO2 97%   BMI 28.90 kg/m   Vital signs normal    Physical Exam  Constitutional: He is oriented to person, place, and time. He appears well-developed and well-nourished.  Non-toxic appearance. He does not appear ill. No distress.  Spitting in a cup.   HENT:  Head: Normocephalic and atraumatic.  Right Ear: External ear normal.  Left Ear: External ear normal.  Nose: Nose normal. No mucosal edema or rhinorrhea.  Mouth/Throat: Oropharynx is clear and moist and mucous membranes are normal. No dental abscesses or uvula swelling.  Eyes: Conjunctivae and EOM are normal. Pupils are equal, round, and reactive to light.  Neck: Normal range of motion and full passive range of motion without pain. Neck supple.  Cardiovascular: Normal rate, regular rhythm and normal heart sounds.  Exam reveals no gallop and no friction rub.   No murmur heard. Pulmonary/Chest: Effort normal. No respiratory distress. He has no wheezes. He has rhonchi. He has no rales. He exhibits no tenderness and no crepitus.  Diffused Rhonchi.  Abdominal: Soft. Normal appearance and bowel sounds are normal. He exhibits no distension. There is no tenderness. There is no rebound and no guarding.  Musculoskeletal: Normal range of motion. He exhibits no edema or tenderness.  Moves all extremities well.   Neurological: He is alert  and oriented to person, place, and time. He has normal strength. No cranial nerve deficit.  Skin: Skin is warm, dry and intact. No rash noted. No erythema. No pallor.  Psychiatric: He has a normal mood and affect. His speech is normal and behavior is normal. His mood appears not anxious.  Nursing note and vitals reviewed.    ED Treatments / Results  DIAGNOSTIC STUDIES: Oxygen Saturation is 97% on RA, normal by my interpretation.     Labs (all labs ordered are listed, but only abnormal results are displayed) Results for orders placed or performed during the hospital encounter of 09/21/16  Comprehensive metabolic panel  Result Value Ref Range  Sodium 137 135 - 145 mmol/L   Potassium 3.7 3.5 - 5.1 mmol/L   Chloride 103 101 - 111 mmol/L   CO2 26 22 - 32 mmol/L   Glucose, Bld 104 (H) 65 - 99 mg/dL   BUN 17 6 - 20 mg/dL   Creatinine, Ser 0.73 0.61 - 1.24 mg/dL   Calcium 9.2 8.9 - 10.3 mg/dL   Total Protein 7.5 6.5 - 8.1 g/dL   Albumin 3.7 3.5 - 5.0 g/dL   AST 19 15 - 41 U/L   ALT 28 17 - 63 U/L   Alkaline Phosphatase 80 38 - 126 U/L   Total Bilirubin 0.4 0.3 - 1.2 mg/dL   GFR calc non Af Amer >60 >60 mL/min   GFR calc Af Amer >60 >60 mL/min   Anion gap 8 5 - 15  Lipase, blood  Result Value Ref Range   Lipase 26 11 - 51 U/L  CBC with Differential  Result Value Ref Range   WBC 9.1 4.0 - 10.5 K/uL   RBC 4.54 4.22 - 5.81 MIL/uL   Hemoglobin 13.2 13.0 - 17.0 g/dL   HCT 39.6 39.0 - 52.0 %   MCV 87.2 78.0 - 100.0 fL   MCH 29.1 26.0 - 34.0 pg   MCHC 33.3 30.0 - 36.0 g/dL   RDW 13.6 11.5 - 15.5 %   Platelets 210 150 - 400 K/uL   Neutrophils Relative % 64 %   Neutro Abs 5.8 1.7 - 7.7 K/uL   Lymphocytes Relative 27 %   Lymphs Abs 2.4 0.7 - 4.0 K/uL   Monocytes Relative 7 %   Monocytes Absolute 0.6 0.1 - 1.0 K/uL   Eosinophils Relative 2 %   Eosinophils Absolute 0.2 0.0 - 0.7 K/uL   Basophils Relative 0 %   Basophils Absolute 0.0 0.0 - 0.1 K/uL   Laboratory interpretation  all normal     EKG  EKG Interpretation None       Radiology No results found.  Procedures Procedures (including critical care time)  Medications Ordered in ED Medications  sodium chloride 0.9 % bolus 1,000 mL (0 mLs Intravenous Stopped 09/22/16 0052)  famotidine (PEPCID) IVPB 20 mg premix (0 mg Intravenous Stopped 09/22/16 0052)     Initial Impression / Assessment and Plan / ED Course  I have reviewed the triage vital signs and the nursing notes.  Pertinent labs & imaging results that were available during my care of the patient were reviewed by me and considered in my medical decision making (see chart for details).   COORDINATION OF CARE: 11:25 PM Discussed treatment plan with pt at bedside and pt agreed to plan. I will check the patient's labs.  Patient offered a nebulizer however he refused. He was given Pepcid IV.  Recheck at time of discharge, patient states is feeling better. He was advised to continue the Protonix. He is to follow through with Dr. Dennard Schaumann about getting the GI referral. I did look at Dr. Samella Parr note and he did describe that. Patient was started on Carafate to coat his inflamed esophagus.  Final Clinical Impressions(s) / ED Diagnoses   Final diagnoses:  Gastroesophageal reflux disease with esophagitis    New Prescriptions Discharge Medication List as of 09/22/2016  1:56 AM    START taking these medications   Details  sucralfate (CARAFATE) 1 GM/10ML suspension Take 10 mLs (1 g total) by mouth 4 (four) times daily -  with meals and at bedtime., Starting Fri 09/22/2016, Print  Plan discharge  Rolland Porter, MD, FACEP  I personally performed the services described in this documentation, which was scribed in my presence. The recorded information has been reviewed and considered.  Rolland Porter, MD, Barbette Or, MD 09/22/16 [id number]

## 2016-09-21 NOTE — ED Triage Notes (Signed)
Pt c/o vomiting blood that started tonight; pt had a cup with some bloody tissues;

## 2016-09-21 NOTE — Progress Notes (Signed)
Subjective:    Patient ID: Darrell Cunningham, male    DOB: [birth year], 56 y.o.   MRN: [id number]  HPI  Patient towards progressive dysphasia at first to solids and now to even liquids typically worse over the last 3 weeks. Patient states he has to take very small bites and chew his food many times and it was still gets stuck in her as it passes through his esophagus into the stomach. Frequently he has to drink a lot of water to flush it down because it stuck. He has lost 14 pounds unintentionally because he can eat very little. He denies any melena. He denies any hematochezia. He is tried Prilosec over-the-counter with no relief. He also reports frequent acid reflux. At times he regurgitates food into his mouth, or bitter acidic fluid that he has to throw up and spit out. He also reports left upper quadrant discomfort. He is taking meloxicam every day Past Medical History:  Diagnosis Date  . Allergy   . Asthma    as a child  . CP (cerebral palsy), spastic ([employer])    right  . Hyperlipidemia   . Prediabetes   . Smoker    quit 4/14   Past Surgical History:  Procedure Laterality Date  . MOUTH SURGERY     Current Outpatient Prescriptions on File Prior to Visit  Medication Sig Dispense Refill  . meloxicam (MOBIC) 15 MG tablet TAKE 1 TABLET (15 MG TOTAL) BY MOUTH DAILY. 30 tablet 3  . PROAIR HFA 108 (90 Base) MCG/ACT inhaler INHALE 2 PUFFS INTO THE LUNGS EVERY 6 (SIX) HOURS AS NEEDED FOR WHEEZING OR SHORTNESS OF BREATH. 18 g 0  . tiZANidine (ZANAFLEX) 4 MG tablet TAKE 1 TABLET (4 MG TOTAL) BY MOUTH EVERY 6 (SIX) HOURS AS NEEDED FOR MUSCLE SPASMS. 30 tablet 0   No current facility-administered medications on file prior to visit.    Allergies  Allergen Reactions  . Penicillins     Childhood rxn   Social History   Social History  . Marital status: Married    Spouse name: N/A  . Number of children: N/A  . Years of education: N/A   Occupational History  . Not on file.   Social  History Main Topics  . Smoking status: Former Smoker    Types: Cigarettes    Start date: 10/31/2012  . Smokeless tobacco: Never Used  . Alcohol use No  . Drug use: No  . Sexual activity: Not on file   Other Topics Concern  . Not on file   Social History Narrative  . No narrative on file     Review of Systems  All other systems reviewed and are negative.      Objective:   Physical Exam  Neck: Neck supple.  Cardiovascular: Normal rate, regular rhythm and normal heart sounds.   Pulmonary/Chest: Effort normal and breath sounds normal. No stridor. No respiratory distress. He has no wheezes. He has no rales.  Abdominal: Soft. Bowel sounds are normal. He exhibits no distension. There is no tenderness. There is no rebound and no guarding.  Lymphadenopathy:    He has no cervical adenopathy.  Vitals reviewed.         Assessment & Plan:  GERD with stricture - Plan: pantoprazole (PROTONIX) 40 MG tablet, CBC with Differential/Platelet, COMPLETE METABOLIC PANEL WITH GFR  I suspect the patient has gastroesophageal reflux disease that has progressed and turn into an esophageal stricture based on his history and his weight loss.  I will have the patient discontinue meloxicam and he is to start protonix 40 mg twice a day and I will consult GI for an EGD and possibly a dilatation if necessary

## 2016-09-22 ENCOUNTER — Telehealth: Payer: Self-pay | Admitting: Family Medicine

## 2016-09-22 DIAGNOSIS — K2100 Gastro-esophageal reflux disease with esophagitis, without bleeding: Secondary | ICD-10-CM

## 2016-09-22 LAB — COMPREHENSIVE METABOLIC PANEL
ALBUMIN: 3.7 g/dL (ref 3.5–5.0)
ALT: 28 U/L (ref 17–63)
AST: 19 U/L (ref 15–41)
Alkaline Phosphatase: 80 U/L (ref 38–126)
Anion gap: 8 (ref 5–15)
BILIRUBIN TOTAL: 0.4 mg/dL (ref 0.3–1.2)
BUN: 17 mg/dL (ref 6–20)
CO2: 26 mmol/L (ref 22–32)
Calcium: 9.2 mg/dL (ref 8.9–10.3)
Chloride: 103 mmol/L (ref 101–111)
Creatinine, Ser: 0.73 mg/dL (ref 0.61–1.24)
GFR calc Af Amer: >60 mL/min (ref >60)
GFR calc non Af Amer: >60 mL/min (ref >60)
GLUCOSE: 104 mg/dL — AB (ref 65–99)
POTASSIUM: 3.7 mmol/L (ref 3.5–5.1)
SODIUM: 137 mmol/L (ref 135–145)
TOTAL PROTEIN: 7.5 g/dL (ref 6.5–8.1)

## 2016-09-22 LAB — CBC WITH DIFFERENTIAL/PLATELET
BASOS PCT: 0 %
Basophils Absolute: 0 10*3/uL (ref 0.0–0.1)
Eosinophils Absolute: 0.2 10*3/uL (ref 0.0–0.7)
Eosinophils Relative: 2 %
HEMATOCRIT: 39.6 % (ref 39.0–52.0)
Hemoglobin: 13.2 g/dL (ref 13.0–17.0)
LYMPHS ABS: 2.4 10*3/uL (ref 0.7–4.0)
Lymphocytes Relative: 27 %
MCH: 29.1 pg (ref 26.0–34.0)
MCHC: 33.3 g/dL (ref 30.0–36.0)
MCV: 87.2 fL (ref 78.0–100.0)
MONO ABS: 0.6 10*3/uL (ref 0.1–1.0)
MONOS PCT: 7 %
NEUTROS ABS: 5.8 10*3/uL (ref 1.7–7.7)
Neutrophils Relative %: 64 %
Platelets: 210 10*3/uL (ref 150–400)
RBC: 4.54 MIL/uL (ref 4.22–5.81)
RDW: 13.6 % (ref 11.5–15.5)
WBC: 9.1 10*3/uL (ref 4.0–10.5)

## 2016-09-22 LAB — LIPASE, BLOOD: Lipase: 26 U/L (ref 11–51)

## 2016-09-22 MED ORDER — SUCRALFATE 1 GM/10ML PO SUSP: 1.0000 g | Freq: Three times a day (TID) | ORAL | 0 refills | Status: DC

## 2016-09-22 NOTE — Discharge Instructions (Signed)
Continue the protonix. Add the carafate. Call Dr Olevia Perches office to get an appointment to be evaluated.  Recheck if you get worse, such as vomiting large amounts of blood (over 1/2 cup at a time), your food gets stuck.

## 2016-09-22 NOTE — Telephone Encounter (Signed)
Order placed to [facility]

## 2016-09-22 NOTE — Telephone Encounter (Signed)
-----   Message from [name], MD sent at 09/22/2016  6:49 AM EST ----- Needs GI appt, 1st available.

## 2016-09-29 ENCOUNTER — Encounter: Payer: Self-pay | Admitting: Physician Assistant

## 2016-09-29 ENCOUNTER — Ambulatory Visit (INDEPENDENT_AMBULATORY_CARE_PROVIDER_SITE_OTHER): Payer: Medicaid Other | Admitting: Physician Assistant

## 2016-09-29 VITALS — BP 128/70 | HR 66 | Ht 60.0 in | Wt 147.0 lb

## 2016-09-29 DIAGNOSIS — K219 Gastro-esophageal reflux disease without esophagitis: Secondary | ICD-10-CM | POA: Diagnosis not present

## 2016-09-29 DIAGNOSIS — R1310 Dysphagia, unspecified: Secondary | ICD-10-CM | POA: Diagnosis not present

## 2016-09-29 DIAGNOSIS — K920 Hematemesis: Secondary | ICD-10-CM

## 2016-09-29 DIAGNOSIS — R1012 Left upper quadrant pain: Secondary | ICD-10-CM | POA: Diagnosis not present

## 2016-09-29 NOTE — Progress Notes (Addendum)
Chief Complaint: Dysphagia and Abdominal Pain  HPI:  Darrell Cunningham is a 56 year old Caucasian male with a past medical history of hyperlipidemia and others listed below, who was referred to me by [name], MD for a complaint of dysphagia and left upper quadrant abdominal pain .     Patient has followed with Dr. [name] in the past with a screening colonoscopy in 2016. This showed 2 sessile polyps ranging from 4-6 mm in the cecum and in the descending colon as well as mild diverticulosis in the left colon. Repeat colonoscopy was recommended in 5 years due to a finding of precancerous polyps.   Patient was recently seen in the ED on 09/21/16 for an episode of hematemesis that occurred that night. He also describes abdominal pain which worsened when he laid down. He had a history of 3 weeks of regurgitating food or anything that he swallowed throughout the day. He also described a painful swallow. He had seen his PCP earlier that day who had prescribed Protonix 40 mg twice a day. Labs at that time including a CMP, lipase and CBC were normal. He was then also started on Carafate.   Today, the patient tells me that over the past 4 weeks he has had difficulty with swallowing almost anything that he puts in his mouth. He tells me that solids always get stuck, so he has changed his diet to mostly soft foods, typically eating oatmeal or another soft substance, but even sometimes this gets stuck and comes back up. Patient also describes this occurring with liquids. Patient has tried to alter his diet away from acidic things and anything else that he has "read online" that could irritate his stomach. He was started on Pantoprazole 40 mg twice a day and Carafate 4 times a day. He is taking these appropriately and feels like they have "helped a little bit". Patient also describes a throat "pain", this is mostly when food gets stuck, but can sometimes occur when he swallows. Patient also tells me that he had one  episode of hematemesis, tell me that he vomited bright red blood and went to the ED as above, this has not occurred since that time.   Along with the above patient describes a left upper quadrant pain which started at the same time as his difficulty with swallowing. This too has remained about the same and he describes it as a "discomfort" that he can feel when he burps or has heartburn or reflux. Associated symptoms include a weight loss of 20 pounds in the past month.   Patient denies fever, chills, blood in his stool, melena, NSAID use, weight loss, fatigue, anorexia, nausea, vomiting or symptoms that awaken him at night.  Past Medical History:  Diagnosis Date  . Allergy   . Asthma    as a child  . 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 Hyperlipidemia   . Prediabetes   . Smoker    quit 4/14    Past Surgical History:  Procedure Laterality Date  . MOUTH SURGERY      Current Outpatient Prescriptions  Medication Sig Dispense Refill  . ibuprofen (ADVIL,MOTRIN) 200 MG tablet Take 400 mg by mouth at bedtime.    . pantoprazole (PROTONIX) 40 MG tablet Take 1 tablet (40 mg total) by mouth 2 (two) times daily. 60 tablet 3  . PROAIR HFA 108 (90 Base) MCG/ACT inhaler INHALE 2 PUFFS INTO THE LUNGS EVERY 6 (SIX) HOURS AS NEEDED FOR  WHEEZING OR SHORTNESS OF BREATH. 18 g 0  . sucralfate (CARAFATE) 1 GM/10ML suspension Take 10 mLs (1 g total) by mouth 4 (four) times daily -  with meals and at bedtime. 420 mL 0  . tiZANidine (ZANAFLEX) 4 MG tablet TAKE 1 TABLET (4 MG TOTAL) BY MOUTH EVERY 6 (SIX) HOURS AS NEEDED FOR MUSCLE SPASMS. 30 tablet 0   No current facility-administered medications for this visit.     Allergies as of 09/29/2016 - Review Complete 09/29/2016  Allergen Reaction Noted  . Penicillins  11/01/2012    Family History  Problem Relation Age of Onset  . Colon cancer Maternal Grandmother     Social History   Social History  . Marital status: Married    Spouse name:  N/A  . Number of children: N/A  . Years of education: N/A   Occupational History  . Not on file.   Social History Main Topics  . Smoking status: Former Smoker    Types: Cigarettes    Start date: 10/31/2012  . Smokeless tobacco: Never Used  . Alcohol use No  . Drug use: No  . Sexual activity: Not on file   Other Topics Concern  . Not on file   Social History Narrative  . No narrative on file    Review of Systems:    Constitutional: Positive for weight loss Skin: No rash  Cardiovascular: No chest pain  Respiratory: No SOB  Gastrointestinal: See HPI and otherwise negative Genitourinary: No dysuria or change in urinary frequency Neurological: No headache, dizziness or syncope Musculoskeletal: No new muscle or joint pain Hematologic: No bleeding Psychiatric: No history of depression or anxiety   Physical Exam:  Vital signs: BP 128/70   Pulse 66   Ht 5' (1.524 m)   Wt 147 lb (66.7 kg)   BMI 28.71 kg/m   Constitutional:   Pleasant Caucasian male appears to be in NAD, Well developed, Well nourished, alert and cooperative Head:  Normocephalic and atraumatic. Eyes:   PEERL, EOMI. No icterus. Conjunctiva pink. Ears:  Normal auditory acuity. Neck:  Supple Throat: Oral cavity and pharynx without inflammation, swelling or lesion.  Respiratory: Respirations even and unlabored. Lungs clear to auscultation bilaterally.   No wheezes, crackles, or rhonchi.  Cardiovascular: Normal S1, S2. No MRG. Regular rate and rhythm. No peripheral edema, cyanosis or pallor.  Gastrointestinal:  Soft, nondistended, mild LUQ ttp No rebound or guarding. Normal bowel sounds. No appreciable masses or hepatomegaly. Rectal:  Not performed.  Msk:  Symmetrical without gross deformities. Without edema, no deformity or joint abnormality.  Neurologic:  Alert and  oriented x4;  grossly normal neurologically.  Skin:   Dry and intact without significant lesions or rashes. Psychiatric: Demonstrates good  judgement and reason without abnormal affect or behaviors.  MOST RECENT LABS: CBC    Component Value Date/Time   WBC 9.1 09/21/2016 2333   RBC 4.54 09/21/2016 2333   HGB 13.2 09/21/2016 2333   HCT 39.6 09/21/2016 2333   PLT 210 09/21/2016 2333   MCV 87.2 09/21/2016 2333   MCH 29.1 09/21/2016 2333   MCHC 33.3 09/21/2016 2333   RDW 13.6 09/21/2016 2333   LYMPHSABS 2.4 09/21/2016 2333   MONOABS 0.6 09/21/2016 2333   EOSABS 0.2 09/21/2016 2333   BASOSABS 0.0 09/21/2016 2333    CMP     Component Value Date/Time   NA 137 09/21/2016 2333   K 3.7 09/21/2016 2333   CL 103 09/21/2016 2333   CO2 26 09/21/2016  2333   GLUCOSE 104 (H) 09/21/2016 2333   BUN 17 09/21/2016 2333   CREATININE 0.73 09/21/2016 2333   CREATININE 0.72 09/21/2016 1137   CALCIUM 9.2 09/21/2016 2333   PROT 7.5 09/21/2016 2333   ALBUMIN 3.7 09/21/2016 2333   AST 19 09/21/2016 2333   ALT 28 09/21/2016 2333   ALKPHOS 80 09/21/2016 2333   BILITOT 0.4 09/21/2016 2333   GFRNONAA >60 09/21/2016 2333   GFRNONAA >89 09/21/2016 1137   GFRAA >60 09/21/2016 2333   GFRAA >89 09/21/2016 1137    Assessment: 1. Dysphagia: Worsening over the past month to both solids and liquids, weight loss of 20 pounds, with reflux; consider most likely esophageal stricture versus ring versus web versus mass versus other 2. GERD: Increased over the past month as above, some relief with Pantoprazole 40 mg twice a day and Carafate 4 times a day 3. Left upper quadrant pain: With all of the above, likely related to gastritis 4. Hematemesis: One episode, seen in ED, hemoglobin normal, none since; Consider most likely erosive esophagitis versus Mallory-Weiss tear versus other  Plan: 1. Scheduled patient for an EGD with dilation with Dr. [name] at his next available time March 22nd. Will discuss with Dr. [name] to see if patient can be moved up sooner as he is already experiencing weight loss from this process. Discussed risks, benefits,  limitations and alternatives and the patient agrees to proceed. This was scheduled in the [facility]. 2. Reviewed anti-dysphagia measures including taking small bites, drinking sips of water between bites, avoiding distraction while eating and the chin tuck technique. 3. Patient to continue his Pantoprazole 40 mg twice a day, 30-60 minutes before eating breakfast and dinner. 4. Patient to continue his Carafate 4 times a day, we did discuss timing of this medication 1 hour before or 2 hours after eating and other meds 5. Patient to return to clinic per Dr. Vena Rua recommendations after time of procedure or sooner if necessary.  Ellouise Newer, PA-C [facility] 09/29/2016, 3:45 PM  Cc: [name], MD   Addendum: Reviewed and agree with initial management. I will overbook myself on 10/09/2016 to accommodate this procedure sooner. 7:30 AM [name], MD

## 2016-09-29 NOTE — Patient Instructions (Signed)
Continue taking Carafate  Continue taking Pantoprazole twice a day  You have been scheduled for an endoscopy. Please follow written instructions given to you at your visit today. If you use inhalers (even only as needed), please bring them with you on the day of your procedure. Your physician has requested that you go to www.startemmi.com and enter the access code given to you at your visit today. This web site gives a general overview about your procedure. However, you should still follow specific instructions given to you by our office regarding your preparation for the procedure.

## 2016-10-02 ENCOUNTER — Telehealth: Payer: Self-pay

## 2016-10-02 NOTE — Telephone Encounter (Signed)
-----   Message from Marlon Pel, RN sent at 10/02/2016 10:05 AM EST ----- Regarding: FW: EGD with dil See message.  I will open you a spot if patient agrees ----- Message ----- From: Levin Erp, PA Sent: 10/02/2016   9:26 AM To: Marlon Pel, RN Subject: FW: EGD with dil                               Can you make sure this works for him and add him on as directed below? Thanks-JLL  ----- Message ----- From: [name], MD Sent: 09/29/2016   5:02 PM To: Levin Erp, PA Subject: FW: EGD with dil                               I have had about enough of you today ;) You can have Sheri or the like add him on for EGD at 730 AM Monday March 12th Thanks for seeing him  Ulice Dash  ----- Message ----- From: Levin Erp, PA Sent: 09/29/2016   3:49 PM To: [name], MD Subject: EGD with dil                                   EGD with dil was scheduled March 22nd, your next available, but pt has lost 20# and unable to eat hardly anything or even sometimes drink liquids. He is miserable. Any chance you could fit him in earlier than that for EGD with dil?    Thank you so much. This patient would actually benefit greatly from earlier procedure.  Ellouise Newer, PA-C

## 2016-10-02 NOTE — Telephone Encounter (Signed)
Pt appt has been rescheduled and pt re instructed.  He will call with any questions.

## 2016-10-09 ENCOUNTER — Encounter: Payer: Self-pay | Admitting: Internal Medicine

## 2016-10-09 ENCOUNTER — Other Ambulatory Visit: Payer: Self-pay

## 2016-10-09 ENCOUNTER — Telehealth: Payer: Self-pay

## 2016-10-09 ENCOUNTER — Ambulatory Visit (AMBULATORY_SURGERY_CENTER): Payer: Medicaid Other | Admitting: Internal Medicine

## 2016-10-09 VITALS — BP 134/79 | HR 72 | Temp 98.6°F | Resp 19 | Ht 60.0 in | Wt 147.0 lb

## 2016-10-09 DIAGNOSIS — K2289 Other specified disease of esophagus: Secondary | ICD-10-CM

## 2016-10-09 DIAGNOSIS — K219 Gastro-esophageal reflux disease without esophagitis: Secondary | ICD-10-CM

## 2016-10-09 DIAGNOSIS — K228 Other specified diseases of esophagus: Secondary | ICD-10-CM

## 2016-10-09 DIAGNOSIS — C801 Malignant (primary) neoplasm, unspecified: Secondary | ICD-10-CM

## 2016-10-09 DIAGNOSIS — R1310 Dysphagia, unspecified: Secondary | ICD-10-CM

## 2016-10-09 HISTORY — DX: Malignant (primary) neoplasm, unspecified: C80.1

## 2016-10-09 MED ORDER — SODIUM CHLORIDE 0.9 % IV SOLN: 500.0000 mL | INTRAVENOUS | Status: DC

## 2016-10-09 NOTE — Progress Notes (Signed)
To PACU VSS, Report to RN 

## 2016-10-09 NOTE — Progress Notes (Signed)
Patient's pathology sent [facility] . Called [facility] at [phone].

## 2016-10-09 NOTE — Telephone Encounter (Signed)
Pt scheduled for CT of CAP at [facility] CT 10/12/16@3 :30pm, pt to arrive there at 3pm. Pt to be NPO after 1:30pm. Pt to drink water based contrast at [place]. [place] RN to notify pt of appts and instructions.

## 2016-10-09 NOTE — Patient Instructions (Addendum)
YOU HAD AN ENDOSCOPIC PROCEDURE TODAY AT [facility]:   Refer to the procedure report that was given to you for any specific questions about what was found during the examination.  If the procedure report does not answer your questions, please call your gastroenterologist to clarify.  If you requested that your care partner not be given the details of your procedure findings, then the procedure report has been included in a sealed envelope for you to review at your convenience later.  YOU SHOULD EXPECT: Some feelings of bloating in the abdomen. Passage of more gas than usual.  Walking can help get rid of the air that was put into your GI tract during the procedure and reduce the bloating. If you had a lower endoscopy (such as a colonoscopy or flexible sigmoidoscopy) you may notice spotting of blood in your stool or on the toilet paper. If you underwent a bowel prep for your procedure, you may not have a normal bowel movement for a few days.  Please Note:  You might notice some irritation and congestion in your nose or some drainage.  This is from the oxygen used during your procedure.  There is no need for concern and it should clear up in a day or so.  SYMPTOMS TO REPORT IMMEDIATELY:    Following upper endoscopy (EGD)  Vomiting of blood or coffee ground material  New chest pain or pain under the shoulder blades  Painful or persistently difficult swallowing  New shortness of breath  Fever of 100F or higher  Black, tarry-looking stools  For urgent or emergent issues, a gastroenterologist can be reached at any hour by calling [phone].   DIET:  We do recommend a small meal at first, but then you may proceed to your regular diet.  Drink plenty of fluids but you should avoid alcoholic beverages for 24 hours.  ACTIVITY:  You should plan to take it easy for the rest of today and you should NOT DRIVE or use heavy machinery until tomorrow (because of the sedation medicines used  during the test).    FOLLOW UP: Our staff will call the number listed on your records the next business day following your procedure to check on you and address any questions or concerns that you may have regarding the information given to you following your procedure. If we do not reach you, we will leave a message.  However, if you are feeling well and you are not experiencing any problems, there is no need to return our call.  We will assume that you have returned to your regular daily activities without incident.  If any biopsies were taken you will be contacted by phone or by letter within the next 1-3 weeks.  Please call us at [phone] if you have not heard about the biopsies in 3 weeks.   Appointment with CT of chest,abdomen and pelvis and Surgical and Oncology referral being made by our office. Soft Diet for now Contrast given today  CT scan schedule on October 12, 2016 at [facility] at 3pm and CT scan schedule at 3:30. They will give you a contrast there. Please do not EAT OR DRINK anything after 1PM.   SIGNATURES/CONFIDENTIALITY: You and/or your care partner have signed paperwork which will be entered into your electronic medical record.  These signatures attest to the fact that that the information above on your After Visit Summary has been reviewed and is understood.  Full responsibility of the confidentiality of  this discharge information lies with you and/or your care-partner.

## 2016-10-09 NOTE — Progress Notes (Signed)
Called to room to assist during endoscopic procedure.  Patient ID and intended procedure confirmed with present staff. Received instructions for my participation in the procedure from the performing physician.  

## 2016-10-09 NOTE — Op Note (Signed)
[facility] Patient Name: Darrell Cunningham Procedure Date: 10/09/2016 7:33 AM MRN: [id number] Endoscopist: [name] , MD Age: 56 Referring MD:  Date of Birth: [birth year] Gender: Male Account #: [id number] Procedure:                Upper GI endoscopy Indications:              Abdominal pain in the left upper quadrant,                            Dysphagia, Gastro-esophageal reflux disease Medicines:                Monitored Anesthesia Care Procedure:                Pre-Anesthesia Assessment:                           - Prior to the procedure, a History and Physical                            was performed, and patient medications and                            allergies were reviewed. The patient's tolerance of                            previous anesthesia was also reviewed. The risks                            and benefits of the procedure and the sedation                            options and risks were discussed with the patient.                            All questions were answered, and informed consent                            was obtained. Prior Anticoagulants: The patient has                            taken no previous anticoagulant or antiplatelet                            agents. ASA Grade Assessment: II - A patient with                            mild systemic disease. After reviewing the risks                            and benefits, the patient was deemed in                            satisfactory condition to undergo the procedure.  After obtaining informed consent, the endoscope was                            passed under direct vision. Throughout the                            procedure, the patient's blood pressure, pulse, and                            oxygen saturations were monitored continuously. The                            Endoscope was introduced through the mouth, and                            advanced to the  second part of duodenum. The upper                            GI endoscopy was accomplished without difficulty.                            The patient tolerated the procedure well. Scope In: Scope Out: Findings:                 A large, fungating mass with no bleeding until                            contact was found in the lower third of the                            esophagus, 33 cm from the incisors and extending to                            the GE junction at 39 cm. The mass was partially                            obstructing and partially circumferential                            (involving one-half of the lumen circumference).                            Multiple biopsies were obtained with cold forceps                            for histology in a targeted manner.                           The perviously described mass was also seen on                            retroflexion in the stomach at the gastroesophageal  junction.                           The exam of the stomach was otherwise normal.                           The examined duodenum was normal. Complications:            No immediate complications. Estimated Blood Loss:     Estimated blood loss was minimal. 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 Multiple biopsies.                           - Mass visible during gastric retroflexion at GE                            junction.                           - Otherwise normal stomach.                           - Normal examined duodenum. Recommendation:           - Patient has a contact number available for                            emergencies. The signs and symptoms of potential                            delayed complications were discussed with the                            patient. Return to normal  activities tomorrow.                            Written discharge instructions were provided to the                            patient.                           - Resume previous diet.                           - Soft diet.                           - Continue present medications.                           -  Await pathology results.                           - CT chest, abdomen and pelvis.                           - Surgical and oncology referrals ASAP. [name], MD 10/09/2016 8:03:09 AM This report has been signed electronically.

## 2016-10-10 ENCOUNTER — Telehealth: Payer: Self-pay

## 2016-10-10 NOTE — Telephone Encounter (Signed)
  Follow up Call-  Call back number 10/09/2016 10/19/2014  Post procedure Call Back phone  # [phone]  Permission to leave phone message Yes Yes  Some recent data might be hidden     Patient questions:  Do you have a fever, pain , or abdominal swelling? No. Pain Score  0 *  Have you tolerated food without any problems? Yes.    Have you been able to return to your normal activities? Yes.    Do you have any questions about your discharge instructions: Diet   No. Medications  No. Follow up visit  No.  Do you have questions or concerns about your Care? No.  Actions: * If pain score is 4 or above: No action needed, pain <4.

## 2016-10-11 ENCOUNTER — Encounter: Payer: Self-pay | Admitting: Hematology

## 2016-10-11 ENCOUNTER — Telehealth: Payer: Self-pay | Admitting: Internal Medicine

## 2016-10-11 ENCOUNTER — Telehealth: Payer: Self-pay | Admitting: Hematology

## 2016-10-11 ENCOUNTER — Other Ambulatory Visit: Payer: Self-pay

## 2016-10-11 DIAGNOSIS — K2289 Other specified disease of esophagus: Secondary | ICD-10-CM

## 2016-10-11 DIAGNOSIS — K228 Other specified diseases of esophagus: Secondary | ICD-10-CM

## 2016-10-11 NOTE — Telephone Encounter (Signed)
Tc to the pt to schedule an appt. Appt has been made for the pt to see Dr. Burr Medico on 3/19 at 11am. Pt aware to arrive 30 minutes early. Demographics verified. Letter mailed.

## 2016-10-11 NOTE — Telephone Encounter (Signed)
Spoke with pt and let him know he is supposed to arrive at [facility] by 2:45pm to drink a water based contrast at their location. Pt verbalized understanding.

## 2016-10-12 ENCOUNTER — Ambulatory Visit (INDEPENDENT_AMBULATORY_CARE_PROVIDER_SITE_OTHER)
Admission: RE | Admit: 2016-10-12 | Discharge: 2016-10-12 | Disposition: A | Discharge status: Home / Self Care | Payer: Medicaid Other | Source: Ambulatory Visit | Attending: Internal Medicine | Admitting: Internal Medicine

## 2016-10-12 DIAGNOSIS — K228 Other specified diseases of esophagus: Secondary | ICD-10-CM

## 2016-10-12 DIAGNOSIS — K229 Disease of esophagus, unspecified: Secondary | ICD-10-CM | POA: Diagnosis not present

## 2016-10-12 DIAGNOSIS — K2289 Other specified disease of esophagus: Secondary | ICD-10-CM

## 2016-10-12 MED ORDER — IOPAMIDOL (ISOVUE-300) INJECTION 61%
100.0000 mL | Freq: Once | INTRAVENOUS | Status: AC | PRN
  Administered 2016-10-12: 100 mL via INTRAVENOUS

## 2016-10-13 NOTE — Progress Notes (Signed)
[facility]  [phone]) [phone] Fax:([phone]) [place] Note   Patient Care Team: [name], MD as PCP - General (Family Medicine) 10/16/2016  REFERRING PHYSICIAN: DR. PYRTLE  CHIEF COMPLAINTS/PURPOSE OF CONSULTATION:  Esophageal adenocarcinoma     Esophageal cancer ([place])   09/21/2016 - 09/21/2016 Hospital Admission    esophageal pain and vomiting up blood      10/09/2016 Initial Diagnosis    Esophageal cancer ([place])      10/09/2016 Procedure    EGD 1. Partially obstructing, likely malignant esophageal tumor was found in the lower third of the esophagus. Multiple biopsies.  2. Mass visible during gastric retroflexion at GE junction 3. Otherwise normal stomach 4. Normal examined duodenum       10/09/2016 Pathology Results    Esophagus, biopsy, distal esophageal tumor (33-39) - SUSPICIOUS FOR ADENOCARCINOMA      10/12/2016 Imaging    CT CAP w Contrast IMPRESSION: Distal esophageal mass compatible with primary esophageal malignancy. There are 2 adjacent abnormal appearing subcentimeter paraesophageal lymph nodes which may represent nodal metastasis. Additionally there is a prominent nonspecific 8 mm upper abdominal lymph node.  No evidence for distant metastatic disease in the chest, abdomen or pelvis.      HISTORY OF PRESENTING ILLNESS (10/16/2016):  Darrell Cunningham 56 y.o. male is here because of an esophageal mass that is suspicious for adenocarcinoma. The pt presented to the ED on 09/21/2016 for esophageal pain and vomiting up blood. He had been struggling with regurgitating food or drinks, difficulty swallowing, and throat pain for 3 weeks prior. He was referred by his PCP to GI who ordered him an EGD with dilation with Dr. [name] as soon as possible. The EGD and biopsy was performed on 10/09/2016, which showed suspicion of adenocarcinoma of the esophagus and a CT scan was ordered. The CT results demonstrated a distal  esophageal mass compatible with primary esophageal malignancy. He is here to discuss treatment.   When he has trouble swallowing, he has to stand up and push his head back to get the food down. He has mostly eating oatmeal, Ensure, and other soft foods. He drinks 2-3 Ensure a day. He has cut out coffee and acidic foods to try to relieve his pain. It is not getting better, though. He isn't able to eat much due to this pain and difficulty swallowing. He does not take any pain medication. He has lost about 13 pounds. He also has some left sided abdominal pain. At night, he has a lot of acid reflux and sputum production. Denies constipation, diarrhea, nausea, SOB, or any other concerns.   He has a history of cerebral palsy on his right side. He has some back pain due to a curvature in his spine. History of asthma. His grandmother had breast cancer and colon cancer. His sister passed of breast cancer at age 58. His aunt had ovarian cancer. No other family history of cancer. He quit smoking 4 years ago. 38 pack years, 1-1.5 ppd. He lives at home with his 4 y.o. Daughter. His parents live about 15 minutes away.   MEDICAL HISTORY:  Past Medical History:  Diagnosis Date  . Allergy   . Arthritis   . Asthma    as a child  . 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 Hyperlipidemia   . Neuromuscular disorder ([place])   . Prediabetes   . Smoker    quit 4/14    SURGICAL HISTORY:  Past Surgical History:  Procedure Laterality Date  . MOUTH SURGERY      SOCIAL HISTORY: Social History   Social History  . Marital status: Married    Spouse name: N/A  . Number of children: N/A  . Years of education: N/A   Occupational History  . Not on file.   Social History Main Topics  . Smoking status: Former Smoker    Packs/day: 1.00    Years: 38.00    Types: Cigarettes    Start date: 10/31/2012    Quit date: 10/31/2012  . Smokeless tobacco: Never Used  . Alcohol use No  . Drug use: No  . Sexual  activity: Not on file   Other Topics Concern  . Not on file   Social History Narrative  . No narrative on file    FAMILY HISTORY: Family History  Problem Relation Age of Onset  . Colon cancer Maternal Grandmother   . Cancer Maternal Grandmother     breast cancer   . Cancer Sister 79    breast cancer   . Cancer Maternal Aunt     ovarian cencer   . Stomach cancer Neg Hx   . Esophageal cancer Neg Hx     ALLERGIES:  is allergic to penicillins.  MEDICATIONS:  Current Outpatient Prescriptions  Medication Sig Dispense Refill  . ibuprofen (ADVIL,MOTRIN) 200 MG tablet Take 400 mg by mouth at bedtime.    . pantoprazole (PROTONIX) 40 MG tablet Take 1 tablet (40 mg total) by mouth 2 (two) times daily. 60 tablet 3  . PROAIR HFA 108 (90 Base) MCG/ACT inhaler INHALE 2 PUFFS INTO THE LUNGS EVERY 6 (SIX) HOURS AS NEEDED FOR WHEEZING OR SHORTNESS OF BREATH. 18 g 0  . sucralfate (CARAFATE) 1 GM/10ML suspension Take 10 mLs (1 g total) by mouth 4 (four) times daily -  with meals and at bedtime. 420 mL 0  . tiZANidine (ZANAFLEX) 4 MG tablet TAKE 1 TABLET (4 MG TOTAL) BY MOUTH EVERY 6 (SIX) HOURS AS NEEDED FOR MUSCLE SPASMS. 30 tablet 0   Current Facility-Administered Medications  Medication Dose Route Frequency Provider Last Rate Last Dose  . 0.9 %  sodium chloride infusion  500 mL Intravenous Continuous [name], MD        REVIEW OF SYSTEMS:   Constitutional: Denies fevers, chills or abnormal night sweats (+) loss of appetite (+) weight loss  Eyes: Denies blurriness of vision, double vision or watery eyes Ears, nose, mouth, throat, and face: Denies mucositis or sore throat (+) esophageal pain (+) trouble swallowing  Respiratory: Denies cough, dyspnea or wheezes (+) sputum production Cardiovascular: Denies palpitation, chest discomfort or lower extremity swelling Gastrointestinal:  Denies nausea, heartburn or change in bowel habits (+) left sided abdominal pain (+) acid reflux Skin:  Denies abnormal skin rashes Lymphatics: Denies new lymphadenopathy or easy bruising Neurological:Denies numbness, tingling or new weaknesses (+) cerebral palsy of right side Musculoskeletal: (+) lower back pain Behavioral/Psych: Mood is stable, no new changes  All other systems were reviewed with the patient and are negative.  PHYSICAL EXAMINATION: ECOG PERFORMANCE STATUS: 1 - Symptomatic but completely ambulatory  Vitals:   10/16/16 1127  BP: 134/77  Pulse: 73  Resp: 19  Temp: 97.5 F (36.4 C)   Filed Weights   10/16/16 1127  Weight: 147 lb 3.2 oz (66.8 kg)   GENERAL:alert, no distress and comfortable (+) limp from right sided cerebral palsy SKIN: skin color, texture, turgor are normal, no rashes or significant  lesions EYES: normal, conjunctiva are pink and non-injected, sclera clear OROPHARYNX:no exudate, no erythema and lips, buccal mucosa, and tongue normal  NECK: supple, thyroid normal size, non-tender, without nodularity LYMPH:  no palpable lymphadenopathy in the cervical, axillary or inguinal LUNGS: clear to auscultation and percussion with normal breathing effort (+) wheezing right lung HEART: regular rate & rhythm and no murmurs and no lower extremity edema ABDOMEN:abdomen soft, non-tender and normal bowel sounds (+) tenderness  Musculoskeletal:no cyanosis of digits and no clubbing  PSYCH: alert & oriented x 3 with fluent speech NEURO: no focal motor/sensory deficits  LABORATORY DATA:  I have reviewed the data as listed CBC Latest Ref Rng & Units 09/21/2016 09/21/2016 06/17/2014  WBC 4.0 - 10.5 K/uL 9.1 6.6 6.6  Hemoglobin 13.0 - 17.0 g/dL 13.2 13.4 13.8  Hematocrit 39.0 - 52.0 % 39.6 41.2 40.7  Platelets 150 - 400 K/uL 210 215 222   CMP Latest Ref Rng & Units 09/21/2016 09/21/2016 10/27/2014  Glucose 65 - 99 mg/dL 104(H) 95 110(H)  BUN 6 - 20 mg/dL 17 14 12   Creatinine 0.61 - 1.24 mg/dL 0.73 0.72 0.61  Sodium 135 - 145 mmol/L 137 140 136  Potassium 3.5 - 5.1  mmol/L 3.7 4.2 4.1  Chloride 101 - 111 mmol/L 103 105 106  CO2 22 - 32 mmol/L 26 26 24   Calcium 8.9 - 10.3 mg/dL 9.2 9.3 9.3  Total Protein 6.5 - 8.1 g/dL 7.5 7.1 7.1  Total Bilirubin 0.3 - 1.2 mg/dL 0.4 0.4 0.4  Alkaline Phos 38 - 126 U/L 80 81 77  AST 15 - 41 U/L 19 18 20   ALT 17 - 63 U/L 28 26 27    PATHOLOGY:  Diagnosis 10/09/2016 Esophagus, biopsy, distal esophageal tumor (33-39) - SUSPICIOUS FOR ADENOCARCINOMA, SEE COMMENT. Microscopic Comment The majority of fragments are reactive squamous mucosa with associated acute inflammation. There is a small fragment of atypical glandular epithelium with associated ulceration that is suspicious for adenocarcinoma. There is no background Barrett's esophagus in the glandular component. Dr. Saralyn Pilar has reviewed the case.  RADIOGRAPHIC STUDIES: I have personally reviewed the radiological images as listed and agreed with the findings in the report.  CT CAP w Contrast 10/12/2016 IMPRESSION: Distal esophageal mass compatible with primary esophageal malignancy. There are 2 adjacent abnormal appearing subcentimeter paraesophageal lymph nodes which may represent nodal metastasis. Additionally there is a prominent nonspecific 8 mm upper abdominal lymph node.  No evidence for distant metastatic disease in the chest, abdomen or pelvis.  EGD 10/09/2016   ASSESSMENT & PLAN:  Darrell Cunningham is a 56 y.o. Caucasian male with PMH cerebral palsy and heavy smoking, who presents with dysphagia, odynophagia and weight loss   1. Low esophageal Adenocarcinoma, [id number] -I reviewed the imaging and biopsy pathology results with the patient in detail. Although his biopsy pathology was not definitive for malignancy, based on his endoscopy findings, this is most consistent with esophageal adenocarcinoma. -I recommend a PET scan for staging, to ruled out note and distant metastasis. He does not have definitive metastasis on the CT scan, except a 8 mm upper abdominal  lymph node. -I'll present his case etiology at tumor Board this week. If it's feasible, I recommend [place] for staging. I'll refer him to Dr. Ardis Hughs. -We reviewed the standard care for locally advanced esophageal cancer, including neoadjuvant chemoradiation, followed by esophagectomy.  -He has been referred to see thoracic surgeon Dr. Pia Mau later this week. -I have contacted Radiation Oncology. Referred him.  -I encouraged him to drink  plenty of water, eat soft foods, and eat slowly. I'll refer him to our dietitian for nutrition consult -If he needs neoadjuvant chemoradiation, I recommend weekly carboplatin and Taxol with concurrent radiation. He is likely a candidate for surgical resection. -We'll repeat labs including CBC, CMP, CEA and CA 19.9 on his next visit.  2. Dysphagia, odynophagia and weight loss -Difficulty eating due to esophageal pain -He can eat soft foods, which occasionally get stuck -Drinks 2-3 Ensure a day -He will meet with our dietician  -We discussed that he may need a feeding tube for nutrition supplement, if his dysphagia gets worse  3. Heavy smoking -He has quit smoking completely,history of 40 pack year.   Plan.  -I referred him to Dr. [name] in radiation oncology.  -Ordered PET scan -tumor board discussion, likely refer to Dr. Ardis Hughs for EUS staging  -Return for f/u in 2 weeks to review scans and finalize neoadjuvant treatment    All questions were answered. The patient knows to call the clinic with any problems, questions or concerns.  I spent 60 minutes counseling the patient face to face. The total time spent in the appointment was 60 minutes and more than 50% was on counseling.  This document serves as a record of services personally performed by [name], MD. It was created on her behalf by Martinique Casey, a trained medical scribe. The creation of this record is based on the scribe's personal observations and the provider's statements to them. This document  has been checked and approved by the attending provider.  I have reviewed the above documentation for accuracy and completeness and I agree with the above.   [name], MD 10/16/2016

## 2016-10-15 DIAGNOSIS — C155 Malignant neoplasm of lower third of esophagus: Secondary | ICD-10-CM | POA: Insufficient documentation

## 2016-10-16 ENCOUNTER — Telehealth: Payer: Self-pay | Admitting: Hematology

## 2016-10-16 ENCOUNTER — Encounter: Payer: Self-pay | Admitting: Radiation Oncology

## 2016-10-16 ENCOUNTER — Ambulatory Visit (HOSPITAL_BASED_OUTPATIENT_CLINIC_OR_DEPARTMENT_OTHER): Payer: Medicaid Other | Admitting: Hematology

## 2016-10-16 ENCOUNTER — Institutional Professional Consult (permissible substitution) (INDEPENDENT_AMBULATORY_CARE_PROVIDER_SITE_OTHER): Payer: Medicaid Other | Admitting: Cardiothoracic Surgery

## 2016-10-16 ENCOUNTER — Encounter: Payer: Self-pay | Admitting: Cardiothoracic Surgery

## 2016-10-16 ENCOUNTER — Encounter: Payer: Self-pay | Admitting: Hematology

## 2016-10-16 VITALS — BP 127/82 | HR 80 | Resp 16 | Ht 60.0 in | Wt 147.0 lb

## 2016-10-16 DIAGNOSIS — G809 Cerebral palsy, unspecified: Secondary | ICD-10-CM | POA: Diagnosis not present

## 2016-10-16 DIAGNOSIS — F17211 Nicotine dependence, cigarettes, in remission: Secondary | ICD-10-CM

## 2016-10-16 DIAGNOSIS — D490 Neoplasm of unspecified behavior of digestive system: Secondary | ICD-10-CM

## 2016-10-16 DIAGNOSIS — C155 Malignant neoplasm of lower third of esophagus: Secondary | ICD-10-CM | POA: Diagnosis present

## 2016-10-16 DIAGNOSIS — R634 Abnormal weight loss: Secondary | ICD-10-CM

## 2016-10-16 DIAGNOSIS — R1310 Dysphagia, unspecified: Secondary | ICD-10-CM

## 2016-10-16 NOTE — Progress Notes (Signed)
[address]       [place] [address]             [phone]                    Bosten G Dun[place] Medical Record #[id number] Date of Birth: 10-14-1960  Referring: [name], MD Primary Care: Odette Fraction, MD  Chief Complaint:    Chief Complaint  Patient presents with  . Esophageal Lesion,mass    of distal esophagus...UPPER ENDOSCOPY WITH BX 10/09/16, CT C/A/P 10/12/16...saw Dr. Burr Medico today    History of Present Illness:    Darrell Cunningham 56 y.o. male is seen in the office  today for Esophageal mass, thought to be adenocarcinoma but without definitive biopsy diagnosis. The patient denies any long-term history of acid reflux. Over the past 3 months he's lost about 13 pounds. He's noted increasing difficulty with pain with swallowing and with regurgitation. Because of these symptoms he underwent upper GI endoscopy by Dr. [name]. Mr. Eden Lathe a fungating mass extending up proximally 7 cm in the distal esophagus. CT scan suggests esophageal lymph nodes and a 2.3 cm mass. Biopsy were taken suggestive of adenocarcinoma but not diagnostic.    Patient's past history is significant for mild cerebral palsy involving the right arm and leg and curvature of the spine. He is currently a nonsmoker having quit 4 years ago prior to that he smoked 1-2 packs per day for 38 years. He denies any alcohol intake for the last 20 years.   Current Activity/ Functional Status:  Patient is independent with mobility/ambulation, transfers, ADL's, IADL's.   Zubrod Score: At the time of surgery this patient's most appropriate activity status/level should be described as: []     0    Normal activity, no symptoms [x]     1    Restricted in physical strenuous activity but ambulatory, able to do out light work []     2    Ambulatory and capable of self care, unable to do work activities, up and about               >50 % of waking hours                              []     3     Only limited self care, in bed greater than 50% of waking hours []     4    Completely disabled, no self care, confined to bed or chair []     5    Moribund   Past Medical History:  Diagnosis Date  . Allergy   . Arthritis   . Asthma    as a child  . 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 Hyperlipidemia   . Neuromuscular disorder ([place])   . Prediabetes   . Smoker    quit 4/14    Past Surgical History:  Procedure Laterality Date  . MOUTH SURGERY      Family History  Problem Relation Age of Onset  . Colon cancer Maternal Grandmother   . Cancer Maternal Grandmother     breast cancer   . Cancer Sister 74    breast cancer   . Cancer Maternal Aunt     ovarian cencer   . Stomach cancer Neg Hx   . Esophageal cancer Neg Hx     Social  History   Social History  . Marital status: Married    Spouse name: N/A  . Number of children: N/A  . Years of education: N/A   Occupational History  . Currently on social security disability , he does do yard work on the side    Social History Main Topics  . Smoking status: Former Smoker    Packs/day: 1.00    Years: 38.00    Types: Cigarettes    Start date: 10/31/2012    Quit date: 10/31/2012  . Smokeless tobacco: Never Used  . Alcohol use No  . Drug use: No  . Sexual activity: Not on file     History  Smoking Status  . Former Smoker  . Packs/day: 1.00  . Years: 38.00  . Types: Cigarettes  . Start date: 10/31/2012  . Quit date: 10/31/2012  Smokeless Tobacco  . Never Used    History  Alcohol Use No     Allergies  Allergen Reactions  . Penicillins     Has patient had a PCN reaction causing immediate rash, facial/tongue/throat swelling, SOB or lightheadedness with hypotension: No Has patient had a PCN reaction causing severe rash involving mucus membranes or skin necrosis: No Has patient had a PCN reaction that required hospitalization No Has patient had a PCN reaction occurring within the last 10 years: No If all of  the above answers are "NO", then may proceed with Cephalosporin use. Unknown Childhood reaction    Current Outpatient Prescriptions  Medication Sig Dispense Refill  . ibuprofen (ADVIL,MOTRIN) 200 MG tablet Take 400 mg by mouth at bedtime.    . pantoprazole (PROTONIX) 40 MG tablet Take 1 tablet (40 mg total) by mouth 2 (two) times daily. 60 tablet 3  . PROAIR HFA 108 (90 Base) MCG/ACT inhaler INHALE 2 PUFFS INTO THE LUNGS EVERY 6 (SIX) HOURS AS NEEDED FOR WHEEZING OR SHORTNESS OF BREATH. 18 g 0  . sucralfate (CARAFATE) 1 GM/10ML suspension Take 10 mLs (1 g total) by mouth 4 (four) times daily -  with meals and at bedtime. 420 mL 0  . tiZANidine (ZANAFLEX) 4 MG tablet TAKE 1 TABLET (4 MG TOTAL) BY MOUTH EVERY 6 (SIX) HOURS AS NEEDED FOR MUSCLE SPASMS. 30 tablet 0   Current Facility-Administered Medications  Medication Dose Route Frequency Provider Last Rate Last Dose  . 0.9 %  sodium chloride infusion  500 mL Intravenous Continuous [name], MD          Review of Systems:     Cardiac Review of Systems: Y or N  Chest Pain Blue.Reese    ]  Resting SOB [ n  ] Exertional SOB  [ n ]  Orthopnea [n  ]   Pedal Edema [n   ]    Palpitations Florencio.Farrier  ] Syncope  Florencio.Farrier  ]   Presyncope Florencio.Farrier  ]  General Review of Systems: [Y] = yes [  ]=no Constitional: recent weight change [loss 13 lbs over 3 months  ];  Wt loss over the last 3 months [   ] anorexia Blue.Reese  ]; fatigue [  ]; nausea [  ]; night sweats [  ]; fever [  ]; or chills [  ];          Dental: poor dentition[ n ]; Last Dentist visit:   Eye : blurred vision [  ]; diplopia [   ]; vision changes [  ];  Amaurosis fugax[  ]; Resp: cough [ y ];  wheezing[  y ];  hemoptysis[  y]; shortness of breath[n  ]; paroxysmal nocturnal dyspnea[ n ]; dyspnea on exertion[ y ]; or orthopnea[  ];  GI:  gallstones[ n ], vomiting[ y ];  dysphagia[ y ]; melena[ n ];  hematochezia [ n ]; heartburn[  y];   Hx of  Colonoscopy[y ]; GU: kidney stones [  ]; hematuria[ n ];   dysuria [  n];   nocturia[  ];  history of     obstruction [n  ]; urinary frequency [  ]             Skin: rash, swelling[  ];, hair loss[  ];  peripheral edema[  ];  or itching[  ]; Musculosketetal: myalgias[  ];  joint swelling[  ];  joint erythema[  ];  joint pain[  ];  back pain[  ];  Heme/Lymph: bruising[  ];  bleeding[  ];  anemia[  ];  Neuro: TIA[  ];  headaches[  ];  stroke[ n ];  vertigo[  ];  seizures[ n ];   paresthesias[  ];  difficulty walking[  ];  Psych:depression[  ]; anxiety[  ];  Endocrine: diabetes[ n ];  thyroid dysfunction[n  ];  Immunizations: Flu up to date Blue.Reese  ]; Pneumococcal up to date Florencio.Farrier  ];  Other:  Physical Exam: BP 127/82 (BP Location: Right Arm, Patient Position: Sitting, Cuff Size: Large)   Pulse 80   Resp 16   Ht 5' (1.524 m)   Wt 147 lb (66.7 kg)   SpO2 98% Comment: ON RA  BMI 28.71 kg/m   PHYSICAL EXAMINATION: General appearance: alert and cooperative Head: Normocephalic, without obvious abnormality, atraumatic Neck: no adenopathy, no carotid bruit, no JVD, supple, symmetrical, trachea midline and thyroid not enlarged, symmetric, no tenderness/mass/nodules Lymph nodes: Cervical, supraclavicular, and axillary nodes normal. Resp: clear to auscultation bilaterally Back: . ROM normal. No CVA tenderness., Patient has curvature of the spine Cardio: regular rate and rhythm, S1, S2 normal, no murmur, click, rub or gallop GI: soft, non-tender; bowel sounds normal; no masses,  no organomegaly Extremities: extremities normal, atraumatic, no cyanosis or edema and Homans sign is negative, no sign of DVT Neurologic: Alert and oriented X 3, normal strength and tone on the left. Normal symmetric reflexes on the left. Patient has some difficulty with coordination and gait involving the right arm and right leg , he is able to ambulate without difficulty  Diagnostic Studies & Laboratory data:   Diagnosis Esophagus, biopsy, distal esophageal tumor (33-39) - SUSPICIOUS FOR  ADENOCARCINOMA, SEE COMMENT. Microscopic Comment The majority of fragments are reactive squamous mucosa with associated acute inflammation. There is a small fragment of atypical glandular epithelium with associated ulceration that is suspicious for adenocarcinoma. There is no background Barrett's esophagus in the glandular component. Dr. Saralyn Pilar has reviewed the case. The case was called to Dr. [name] on 10/10/2016. [name] MD Pathologist, Electronic  - A large, fungating mass with no bleeding until contact was found in the lower third of the esophagus, 33 cm from the incisors and extending to the GE junction at 39 cm. The mass was partially obstructing and partially circumferential (involving one-half of the lumen circumference). Multiple biopsies were obtained with cold forceps for histology in a targeted manner. Findings: - The perviously described mass was also seen on retroflexion in the stomach at the gastroesophageal junction. - The exam of the stomach was otherwise normal. - The examined duodenum was normal.      Recent  Radiology Findings:     Ct Chest W Contrast and Ct Abdomen Pelvis W Contrast  Result Date: 10/13/2016 CLINICAL DATA:  Patient with history of esophageal carcinoma. Painful swallowing for 4-5 weeks. EXAM: CT CHEST, ABDOMEN, AND PELVIS WITH CONTRAST TECHNIQUE: Multidetector CT imaging of the chest, abdomen and pelvis was performed following the standard protocol during bolus administration of intravenous contrast. CONTRAST:  173mL ISOVUE-300 IOPAMIDOL (ISOVUE-300) INJECTION 61% COMPARISON:  None. FINDINGS: CT CHEST FINDINGS Cardiovascular: The heart is normal in size. Coronary arterial vascular calcifications. No pericardial effusion. Aorta and main pulmonary artery normal in caliber. Mediastinum/Nodes: No enlarged axillary or hilar adenopathy. There is a prominent mildly enhancing 6 mm node adjacent to the right aspect of the distal esophagus (image 42; series 2).  There is an irregular mass within the distal esophagus measuring up to 2.3 cm in thickness. There is an additional adjacent prominent 5 mm node posterior to the midesophagus (image 29; series 2). Lungs/Pleura: Probable small amount of mucus within the right mainstem bronchus. No large area of pulmonary consolidation. No pleural effusion or pneumothorax. 2 mm nodule within the left lower lobe (image 113; series 3). Musculoskeletal: No aggressive or acute appearing osseous lesions. CT ABDOMEN PELVIS FINDINGS Hepatobiliary: The liver is normal in size and contour. No focal hepatic lesion is identified. Gallbladder is unremarkable. No intrahepatic or extrahepatic biliary ductal dilatation. Pancreas: Unremarkable Spleen: Unremarkable Adrenals/Urinary Tract: Adrenal glands are normal. Kidneys enhance symmetrically with contrast. No hydronephrosis. Urinary bladder is decompressed. Stomach/Bowel: No abnormal bowel wall thickening or evidence for bowel obstruction. No free fluid or free intraperitoneal air. Vascular/Lymphatic: Peripheral calcified atherosclerotic plaque. Nonspecific 8 mm gastrohepatic lymph node (image 53; series 2). Reproductive: Prostate is unremarkable. Other: Small fat containing right inguinal hernia. Musculoskeletal: Lumbar spine degenerative changes. No aggressive or acute appearing osseous lesions. IMPRESSION: Distal esophageal mass compatible with primary esophageal malignancy. There are 2 adjacent abnormal appearing subcentimeter paraesophageal lymph nodes which may represent nodal metastasis. Additionally there is a prominent nonspecific 8 mm upper abdominal lymph node. No evidence for distant metastatic disease in the chest, abdomen or pelvis. Electronically Signed   By: Lovey Newcomer M.D.   On: 10/13/2016 08:40     I have independently reviewed the above radiologic studies.  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 PET scan is pending. Likely patient has clinical stage III esophageal cancer, possibly cIVA  if multiple regional lymph nodes were positive on EUS or PET scan. I agree with proceeding with chemotherapy and radiation with reevaluation for postradiation surgical resection depending on response and  stage. I discussed with patient need to continue with good nutrition and avoid PEG tube.  2 mild cerebral palsy affecting the right arm and leg.     I plan  to see the patient back in approximately 4-5 weeks   I  spent 40 minutes counseling the patient face to face and 50% or more the  time was spent in counseling and coordination of care. The total time spent in the appointment was 60 minutes.  [name] MD      [address] [place] [address] Office [phone]   Beeper [phone]  10/16/2016 3:02 PM

## 2016-10-16 NOTE — Telephone Encounter (Signed)
See previous note...Marland KitchenMarland Kitchenopened in error

## 2016-10-16 NOTE — Telephone Encounter (Signed)
Gave patient avs report and appointments for lab/YF/BN 4/4 and Dr. [name] 3/26.

## 2016-10-17 NOTE — Progress Notes (Signed)
GI Location of Tumor / Histology: Esophageal cancer  Darrell Cunningham presented months ago with symptoms of: painful  difficulty swallowing and weight loss  Biopsies of Esophagus, biopsy, distal esophageal tumor (33-39) 10/09/16 - SUSPICIOUS FOR ADENOCARCINOMA, SEE COMMENT. Microscopic Comment The majority of fragments are reactive squamous mucosa with associated acute inflammation. There is a small fragment of atypical glandular epithelium with associated ulceration that is suspicious for adenocarcinoma. There is no background Barrett's esophagus in the glandular component. Dr. Saralyn Pilar has reviewed the case. The case was called to Dr. [name] on 10/10/2016. [name] MD Pathologist, Electronic  - A large, fungating mass with no bleeding until contact was found in the lower third of the esophagus, 33 cm from the incisors and extending to the GE junction at 39 cm. The mass was partially obstructing and partially circumferential (involving one-half of the lumen circumference). Multiple biopsies were obtained with cold forceps for histology in a targeted manner. Findings: - The perviously described mass was also seen on retroflexion in the stomach at the gastroesophageal junction. - The exam of the stomach was otherwise normal. - The examined duodenum was normal.  Past/Anticipated interventions by surgeon, if any:Dr. 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 Burr Medico 11/01/16  Weight changes, if any:13 lb loss, past 3 months,., appt [name], dietician 11/01/16  Bowel/Bladder complaints, if any:   Nausea / Vomiting, if any:   Pain issues, if any:      SAFETY ISSUES:   Prior radiation?  NO  Pacemaker/ICD? NO  Is the patient on methotrexate?   Current Complaints/Details:Married, former  cigarette smoker  1-2 ppd for 38 years,quit  10/31/2012, no smokeless tobacco, no alcohol or drug use  Allergies:PCNS Maternal grandmother colon cancer and breast cancer, , sister breast cancer,Maternal Aunt ovarian cancer,

## 2016-10-19 ENCOUNTER — Encounter: Payer: Medicaid Other | Admitting: Internal Medicine

## 2016-10-19 ENCOUNTER — Telehealth: Payer: Self-pay | Admitting: Genetics

## 2016-10-19 ENCOUNTER — Telehealth: Payer: Self-pay | Admitting: Emergency Medicine

## 2016-10-19 ENCOUNTER — Other Ambulatory Visit: Payer: Self-pay | Admitting: Radiation Oncology

## 2016-10-19 ENCOUNTER — Ambulatory Visit
Admission: RE | Admit: 2016-10-19 | Discharge: 2016-10-19 | Disposition: A | Discharge status: Home / Self Care | Payer: Medicaid Other | Source: Ambulatory Visit | Attending: Radiation Oncology | Admitting: Radiation Oncology

## 2016-10-19 ENCOUNTER — Other Ambulatory Visit: Payer: Self-pay | Admitting: Family Medicine

## 2016-10-19 ENCOUNTER — Encounter: Payer: Self-pay | Admitting: Genetics

## 2016-10-19 ENCOUNTER — Encounter: Payer: Self-pay | Admitting: Radiation Oncology

## 2016-10-19 ENCOUNTER — Telehealth: Payer: Self-pay

## 2016-10-19 VITALS — BP 133/72 | HR 95 | Temp 97.8°F | Resp 20 | Ht 61.0 in | Wt 147.8 lb

## 2016-10-19 DIAGNOSIS — Z800 Family history of malignant neoplasm of digestive organs: Secondary | ICD-10-CM | POA: Diagnosis not present

## 2016-10-19 DIAGNOSIS — R634 Abnormal weight loss: Secondary | ICD-10-CM | POA: Diagnosis not present

## 2016-10-19 DIAGNOSIS — Z803 Family history of malignant neoplasm of breast: Secondary | ICD-10-CM | POA: Insufficient documentation

## 2016-10-19 DIAGNOSIS — R7303 Prediabetes: Secondary | ICD-10-CM | POA: Insufficient documentation

## 2016-10-19 DIAGNOSIS — Z880 Allergy status to penicillin: Secondary | ICD-10-CM | POA: Insufficient documentation

## 2016-10-19 DIAGNOSIS — Z79899 Other long term (current) drug therapy: Secondary | ICD-10-CM | POA: Insufficient documentation

## 2016-10-19 DIAGNOSIS — E785 Hyperlipidemia, unspecified: Secondary | ICD-10-CM | POA: Insufficient documentation

## 2016-10-19 DIAGNOSIS — C155 Malignant neoplasm of lower third of esophagus: Secondary | ICD-10-CM

## 2016-10-19 DIAGNOSIS — Z87891 Personal history of nicotine dependence: Secondary | ICD-10-CM | POA: Diagnosis not present

## 2016-10-19 DIAGNOSIS — G809 Cerebral palsy, unspecified: Secondary | ICD-10-CM | POA: Insufficient documentation

## 2016-10-19 MED ORDER — SUCRALFATE 1 GM/10ML PO SUSP: 1.0000 g | Freq: Three times a day (TID) | ORAL | 2 refills | Status: DC

## 2016-10-19 NOTE — Telephone Encounter (Signed)
ok 

## 2016-10-19 NOTE — Telephone Encounter (Signed)
Ok to refill 

## 2016-10-19 NOTE — Addendum Note (Signed)
Encounter addended by: Doreen Beam, RN on: 10/19/2016 12:11 PM<BR>    Actions taken: Order Reconciliation Section accessed, Home Medications modified

## 2016-10-19 NOTE — Telephone Encounter (Signed)
rx called in

## 2016-10-19 NOTE — Telephone Encounter (Signed)
-----   Message from [name], MD sent at 10/19/2016  7:15 AM EDT ----- Happy to help.   Elivia Robotham, he needs upper EUS, radial +/- linear, for esophageal cancer staging, biopsy.  Next Thursday (29th) with MAC sedation.  Thanks  ----- Message ----- From: [name], MD Sent: 10/18/2016  10:46 PM To: [name], MD, Kyung Rudd, MD, #  Hi Dan,  Dr. [name] referred this pt to [place] for his newly diagnosed low esophageal adeno (although the biopsy was not definitive). CT no distant mets.   Could you get him in for EUS staging, and a repeated biopsy?  Thanks much,  Krista Blue

## 2016-10-19 NOTE — Progress Notes (Signed)
Please see the Nurse Progress Note in the MD Initial Consult Encounter for this patient. 

## 2016-10-19 NOTE — Telephone Encounter (Signed)
Call received from patient; states he received a call today that he needs to come in today to see Dr [name]; no record of appointment; noted appointment for 3/26; message left for Dr [facility] nurse and scheduler in rad onc.

## 2016-10-19 NOTE — Progress Notes (Signed)
GI Location of Tumor / Histology: Esophageal cancer  Darrell Cunningham presented months ago with symptoms of: painful  difficulty swallowing and weight loss  Biopsies of Esophagus, biopsy, distal esophageal tumor     10/09/16 : - SUSPICIOUS FOR ADENOCARCINOMA, SEE COMMENT. Microscopic Comment The majority of fragments are reactive squamous mucosa with associated acute inflammation. There is a small fragment of atypical glandular epithelium with associated ulceration that is suspicious for adenocarcinoma. There is no background Barrett's esophagus in the glandular component. Dr. Saralyn Pilar has reviewed the case. The case was called to Dr. [name] on 10/10/2016. [name] MD Pathologist, Electronic  - A large, fungating mass with no bleeding until contact was found in the lower third of the esophagus, 33 cm from the incisors and extending to the GE junction at 39 cm. The mass was partially obstructing and partially circumferential (involving one-half of the lumen circumference). Multiple biopsies were obtained with cold forceps for histology in a targeted manner. Findings: - The perviously described mass was also seen on retroflexion in the stomach at the gastroesophageal junction. - The exam of the stomach was otherwise normal. - The examined duodenum was normal.  Past/Anticipated interventions by surgeon, if any:Dr. [name], MD Upper Endoscopy with bx 10/09/16,  May need anotherfollow up after radiation/chemotherapy reval for post radiation surgical resection, 4-5 weeks  Past/Anticipated interventions by medical oncology, if GYK:[name] Dr. Burr Medico 11/01/16  Weight changes, if any:13 lb loss, past 3 months,., appt [name], dietician 11/01/16  Bowel/Bladder complaints, if any:  No problems  Nausea / Vomiting, if any: no  Pain issues, if any:   Left side rib area mid side, difficulty swallowing foods    SAFETY ISSUES:   Prior radiation?  NO  Pacemaker/ICD? NO  Is the  patient on methotrexate? NO  Current Complaints/Details:Married, former cigarette smoker  1-2 ppd for 38 years,quit  10/31/2012, no smokeless tobacco, no alcohol or drug use  Allergies:PCNS Maternal grandmother colon cancer and breast cancer, , sister breast cancer,Maternal Aunt ovarian cancer,, Father COPD,deceased,  BP 133/72 (BP Location: Left Arm, Patient Position: Sitting, Cuff Size: Normal)   Pulse 95   Temp 97.8 F (36.6 C) (Oral)   Resp 20   Ht 5\' 1"  (1.549 m)   Wt 147 lb 12.8 oz (67 kg)   BMI 27.93 kg/m   Wt Readings from Last 3 Encounters:  10/19/16 147 lb 12.8 oz (67 kg)  10/16/16 147 lb (66.7 kg)  10/16/16 147 lb 3.2 oz (66.8 kg)

## 2016-10-19 NOTE — Progress Notes (Signed)
Radiation Oncology         ([phone]) [phone] ________________________________  Name: Darrell Cunningham MRN: [id number]  Date: 10/19/2016  DOB: 1961-06-04  NA:TFTDDUK,GURKYH TOM, MD  [name], MD     REFERRING PHYSICIAN: [name], MD   DIAGNOSIS: The encounter diagnosis was Malignant neoplasm of lower third of esophagus ([place]).   HISTORY OF PRESENT ILLNESS: Darrell Cunningham is a 56 y.o. male seen at the request of Dr. Burr Medico for a new diagnosis of esophageal cancer. The patient had a month long history of dysphagia and weight loss of about 13 pounds. He presented to his PCP, Dr. Dennard Schaumann, on 09/21/16. Dr. Dennard Schaumann made a GI referral and prescribed Protonix. That evening, the patient presented to the ED for hematemesis. The patient was given Carafate which he continues to take. The patient underwent an upper endoscopy on 10/09/16 which revealed an esophageal mass 33 cm from the incisors extending to the GE junction at 39 cm. There were multiple biopsies taken at this time. Biopsy suggested adenocarcinoma but this was not diagnostic. CT of the chest, abdomen, and pelvis on 10/12/16 showed a distal esophageal mass compatible with primary esophageal malignancy. There were 2 adjacent abnormal appearing subcentimeter paraesophageal lymph nodes possible for nodal metastasis. There was an additional prominent nonspecific 8 mm upper abdominal lymph node. There was no evidence of distant metastatic disease in the chest, abdomen, or pelvis. Patient was seen by Dr. Burr Medico on 10/16/16. She recommended a PET scan for staging, chemoradiation with Taxol/Carboplatin.The patient was seen by Dr. [name] on 10/16/16 who has recommended proceeding with neoadjuvant therapy followed by imaging and esophagectomy provided that his disease is not widely metastatic. He comes today to discuss the role for radiotherapy.   PREVIOUS RADIATION THERAPY: No   PAST MEDICAL HISTORY:  Past Medical History:  Diagnosis Date  . Allergy     . Arthritis   . Asthma    as a child  . 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 Hyperlipidemia   . Neuromuscular disorder ([place])   . Prediabetes   . Smoker    quit 4/14       PAST SURGICAL HISTORY: Past Surgical History:  Procedure Laterality Date  . MOUTH SURGERY       FAMILY HISTORY:  Family History  Problem Relation Age of Onset  . Colon cancer Maternal Grandmother   . Cancer Maternal Grandmother     breast cancer   . Cancer Sister 62    Deceased at 28 of breast cancer  . Cancer Maternal Aunt     ovarian cencer   . Multiple myeloma Father   . Stomach cancer Neg Hx   . Esophageal cancer Neg Hx      SOCIAL HISTORY:  reports that he quit smoking about 3 years ago. His smoking use included Cigarettes. He started smoking about 3 years ago. He has a 38.00 pack-year smoking history. He has never used smokeless tobacco. He reports that he does not drink alcohol or use drugs. He lives in [place], [place] with his daughter. His parents accompany him as well to today's appointment.   ALLERGIES: Penicillins   MEDICATIONS:  Current Outpatient Prescriptions  Medication Sig Dispense Refill  . ibuprofen (ADVIL,MOTRIN) 200 MG tablet Take 400 mg by mouth at bedtime.    . pantoprazole (PROTONIX) 40 MG tablet Take 1 tablet (40 mg total) by mouth 2 (two) times daily. 60 tablet 3  . PROAIR HFA 108 (90 Base) MCG/ACT  inhaler INHALE 2 PUFFS INTO THE LUNGS EVERY 6 (SIX) HOURS AS NEEDED FOR WHEEZING OR SHORTNESS OF BREATH. 18 g 0  . sucralfate (CARAFATE) 1 GM/10ML suspension Take 10 mLs (1 g total) by mouth 4 (four) times daily -  with meals and at bedtime. 420 mL 0  . tiZANidine (ZANAFLEX) 4 MG tablet TAKE 1 TABLET (4 MG TOTAL) BY MOUTH EVERY 6 (SIX) HOURS AS NEEDED FOR MUSCLE SPASMS. 30 tablet 0   Current Facility-Administered Medications  Medication Dose Route Frequency Provider Last Rate Last Dose  . 0.9 %  sodium chloride infusion  500 mL Intravenous Continuous [name],  MD         REVIEW OF SYSTEMS: On review of systems, the patient reports that he is doing well overall. Patient is positive for a 13 pound weight loss in 3 months as he is only able to eat soft foods and liquids. He is also positive for dysphagia. Patient reports a new onset of epistaxis occurring yesterday. He denies any chest pain, shortness of breath, cough, fevers, chills, or night sweats. He denies any bowel or bladder disturbances, and denies abdominal pain, nausea or vomiting. He reports left side rib area through mid side pain. He denies any other new musculoskeletal or joint aches or pains. A complete review of systems is obtained and is otherwise negative.    PHYSICAL EXAM:  Wt Readings from Last 3 Encounters:  10/19/16 147 lb 12.8 oz (67 kg)  10/16/16 147 lb (66.7 kg)  10/16/16 147 lb 3.2 oz (66.8 kg)   Temp Readings from Last 3 Encounters:  10/19/16 97.8 F (36.6 C) (Oral)  10/16/16 97.5 F (36.4 C) (Oral)  10/09/16 98.6 F (37 C) (Temporal)   BP Readings from Last 3 Encounters:  10/19/16 133/72  10/16/16 127/82  10/16/16 134/77   Pulse Readings from Last 3 Encounters:  10/19/16 95  10/16/16 80  10/16/16 73   Pain Assessment Pain Score: 4  Pain Loc: Throat/10  In general this is a well appearing caucasian gentleman in no acute distress. He is alert and oriented x4 and appropriate throughout the examination. HEENT reveals that the patient is normocephalic, atraumatic. EOMs are intact. PERRLA. Skin is intact without any evidence of gross lesions. Cardiovascular exam reveals a regular rate and rhythm, no clicks rubs or murmurs are auscultated. Chest is clear to auscultation bilaterally. Lymphatic assessment is performed and does not reveal any adenopathy in the cervical, supraclavicular, axillary, or inguinal chains. Abdomen has active bowel sounds in all quadrants and is intact. The abdomen is soft, non tender, non distended. Lower extremities are negative for pretibial  pitting edema, deep calf tenderness, cyanosis or clubbing.   ECOG = 1  0 - Asymptomatic (Fully active, able to carry on all predisease activities without restriction)  1 - Symptomatic but completely ambulatory (Restricted in physically strenuous activity but ambulatory and able to carry out work of a light or sedentary nature. For example, light housework, office work)  2 - Symptomatic, <50% in bed during the day (Ambulatory and capable of all self care but unable to carry out any work activities. Up and about more than 50% of waking hours)  3 - Symptomatic, >50% in bed, but not bedbound (Capable of only limited self-care, confined to bed or chair 50% or more of waking hours)  4 - Bedbound (Completely disabled. Cannot carry on any self-care. Totally confined to bed or chair)  5 - Death   Lolita Rieger, Daniel Nones [facility], [place]  [place], et al. (1982). "Toxicity and response criteria of the [facility]". [place] Oncol. 5 (6): 649-55    LABORATORY DATA:  Lab Results  Component Value Date   WBC 9.1 09/21/2016   HGB 13.2 09/21/2016   HCT 39.6 09/21/2016   MCV 87.2 09/21/2016   PLT 210 09/21/2016   Lab Results  Component Value Date   NA 137 09/21/2016   K 3.7 09/21/2016   CL 103 09/21/2016   CO2 26 09/21/2016   Lab Results  Component Value Date   ALT 28 09/21/2016   AST 19 09/21/2016   ALKPHOS 80 09/21/2016   BILITOT 0.4 09/21/2016      RADIOGRAPHY: Ct Chest W Contrast  Result Date: 10/13/2016 CLINICAL DATA:  Patient with history of esophageal carcinoma. Painful swallowing for 4-5 weeks. EXAM: CT CHEST, ABDOMEN, AND PELVIS WITH CONTRAST TECHNIQUE: Multidetector CT imaging of the chest, abdomen and pelvis was performed following the standard protocol during bolus administration of intravenous contrast. CONTRAST:  134m ISOVUE-300 IOPAMIDOL (ISOVUE-300) INJECTION 61% COMPARISON:  None. FINDINGS: CT CHEST FINDINGS Cardiovascular: The heart is normal in size. Coronary  arterial vascular calcifications. No pericardial effusion. Aorta and main pulmonary artery normal in caliber. Mediastinum/Nodes: No enlarged axillary or hilar adenopathy. There is a prominent mildly enhancing 6 mm node adjacent to the right aspect of the distal esophagus (image 42; series 2). There is an irregular mass within the distal esophagus measuring up to 2.3 cm in thickness. There is an additional adjacent prominent 5 mm node posterior to the midesophagus (image 29; series 2). Lungs/Pleura: Probable small amount of mucus within the right mainstem bronchus. No large area of pulmonary consolidation. No pleural effusion or pneumothorax. 2 mm nodule within the left lower lobe (image 113; series 3). Musculoskeletal: No aggressive or acute appearing osseous lesions. CT ABDOMEN PELVIS FINDINGS Hepatobiliary: The liver is normal in size and contour. No focal hepatic lesion is identified. Gallbladder is unremarkable. No intrahepatic or extrahepatic biliary ductal dilatation. Pancreas: Unremarkable Spleen: Unremarkable Adrenals/Urinary Tract: Adrenal glands are normal. Kidneys enhance symmetrically with contrast. No hydronephrosis. Urinary bladder is decompressed. Stomach/Bowel: No abnormal bowel wall thickening or evidence for bowel obstruction. No free fluid or free intraperitoneal air. Vascular/Lymphatic: Peripheral calcified atherosclerotic plaque. Nonspecific 8 mm gastrohepatic lymph node (image 53; series 2). Reproductive: Prostate is unremarkable. Other: Small fat containing right inguinal hernia. Musculoskeletal: Lumbar spine degenerative changes. No aggressive or acute appearing osseous lesions. IMPRESSION: Distal esophageal mass compatible with primary esophageal malignancy. There are 2 adjacent abnormal appearing subcentimeter paraesophageal lymph nodes which may represent nodal metastasis. Additionally there is a prominent nonspecific 8 mm upper abdominal lymph node. No evidence for distant metastatic  disease in the chest, abdomen or pelvis. Electronically Signed   By: DLovey NewcomerM.D.   On: 10/13/2016 08:40   Ct Abdomen Pelvis W Contrast  Result Date: 10/13/2016 CLINICAL DATA:  Patient with history of esophageal carcinoma. Painful swallowing for 4-5 weeks. EXAM: CT CHEST, ABDOMEN, AND PELVIS WITH CONTRAST TECHNIQUE: Multidetector CT imaging of the chest, abdomen and pelvis was performed following the standard protocol during bolus administration of intravenous contrast. CONTRAST:  1011mISOVUE-300 IOPAMIDOL (ISOVUE-300) INJECTION 61% COMPARISON:  None. FINDINGS: CT CHEST FINDINGS Cardiovascular: The heart is normal in size. Coronary arterial vascular calcifications. No pericardial effusion. Aorta and main pulmonary artery normal in caliber. Mediastinum/Nodes: No enlarged axillary or hilar adenopathy. There is a prominent mildly enhancing 6 mm node adjacent to the right aspect of the distal esophagus (  image 42; series 2). There is an irregular mass within the distal esophagus measuring up to 2.3 cm in thickness. There is an additional adjacent prominent 5 mm node posterior to the midesophagus (image 29; series 2). Lungs/Pleura: Probable small amount of mucus within the right mainstem bronchus. No large area of pulmonary consolidation. No pleural effusion or pneumothorax. 2 mm nodule within the left lower lobe (image 113; series 3). Musculoskeletal: No aggressive or acute appearing osseous lesions. CT ABDOMEN PELVIS FINDINGS Hepatobiliary: The liver is normal in size and contour. No focal hepatic lesion is identified. Gallbladder is unremarkable. No intrahepatic or extrahepatic biliary ductal dilatation. Pancreas: Unremarkable Spleen: Unremarkable Adrenals/Urinary Tract: Adrenal glands are normal. Kidneys enhance symmetrically with contrast. No hydronephrosis. Urinary bladder is decompressed. Stomach/Bowel: No abnormal bowel wall thickening or evidence for bowel obstruction. No free fluid or free  intraperitoneal air. Vascular/Lymphatic: Peripheral calcified atherosclerotic plaque. Nonspecific 8 mm gastrohepatic lymph node (image 53; series 2). Reproductive: Prostate is unremarkable. Other: Small fat containing right inguinal hernia. Musculoskeletal: Lumbar spine degenerative changes. No aggressive or acute appearing osseous lesions. IMPRESSION: Distal esophageal mass compatible with primary esophageal malignancy. There are 2 adjacent abnormal appearing subcentimeter paraesophageal lymph nodes which may represent nodal metastasis. Additionally there is a prominent nonspecific 8 mm upper abdominal lymph node. No evidence for distant metastatic disease in the chest, abdomen or pelvis. Electronically Signed   By: Lovey Newcomer M.D.   On: 10/13/2016 08:40       IMPRESSION/PLAN: 1. Probable distal esophageal cancer. Dr. [name] reviews the findings and work up thusfar with the patient and his family. He reviews the rationale to complete the staging work up with a PET and EUS. The patient is waiting for both of these appointments to be scheduled. We discussed the risks, benefits, short, and long term effects of radiotherapy, and the patient is interested in proceeding. Dr. [name] discusses the delivery and logistics of radiotherapy with 5 1/2 to 6 weeks of therapy. We reviewed consent and a written copy was signed and placed in the chart. CT simulation will be scheduled for next week, and we would anticipate beginning treatment the first week of April 2018. He will follow up with Dr. Burr Medico on 11/01/16 and also see the dietician, [name]. 2.  Possible genetic predisposition to malignancy. After reviewing the patient's family history, I have offered him a referral to genetic counseling due to a family history of breast cancer and ovarian cancer as well as colon cancer. 3. Weight loss. The patient does have some weight loss since this process began. He would like to avoid a PEG/G Tube for now. He will continue  to discuss this as well with [name].  The above documentation reflects my direct findings during this shared patient visit. Please see the separate note by Dr. [name] on this date for the remainder of the patient's plan of care.    Carola Rhine, PAC  This document serves as a record of services personally performed by Kyung Rudd, MD and Shona Simpson, PA-C. It was created on their behalf by Bethann Humble, a trained medical scribe. The creation of this record is based on the scribe's personal observations and the provider's statements to them. This document has been checked and approved by the attending provider.

## 2016-10-19 NOTE — Telephone Encounter (Signed)
Genetic counseling appt scheduled for the pt to see [name] on 4/3 at 10am. Appt scheduled w/Ms. Shirley from [facility] who will notify the pt. Letter mailed to the pt.

## 2016-10-19 NOTE — Addendum Note (Signed)
Encounter addended by: Hayden Pedro, PA-C on: 10/19/2016 11:42 AM<BR>    Actions taken: Sign clinical note

## 2016-10-20 ENCOUNTER — Telehealth: Payer: Self-pay | Admitting: *Deleted

## 2016-10-20 ENCOUNTER — Other Ambulatory Visit: Payer: Self-pay

## 2016-10-20 DIAGNOSIS — C159 Malignant neoplasm of esophagus, unspecified: Secondary | ICD-10-CM

## 2016-10-20 NOTE — Telephone Encounter (Signed)
EUS scheduled, pt instructed and medications reviewed.  Patient instructions mailed to home.  Patient to call with any questions or concerns.  

## 2016-10-20 NOTE — Telephone Encounter (Signed)
Called [facility] spoke with Verdis Frederickson to refill rx carafte as before 420 ml, take 32ml 4x day with meals and at bedtime, called the patient to let him that this was called in andd apologized that the e-script didn't go through 8:44 AM

## 2016-10-23 ENCOUNTER — Ambulatory Visit
Admission: RE | Admit: 2016-10-23 | Discharge: 2016-10-23 | Disposition: A | Discharge status: Home / Self Care | Payer: Medicaid Other | Source: Ambulatory Visit | Attending: Radiation Oncology | Admitting: Radiation Oncology

## 2016-10-23 ENCOUNTER — Ambulatory Visit: Payer: Medicaid Other

## 2016-10-23 DIAGNOSIS — C159 Malignant neoplasm of esophagus, unspecified: Secondary | ICD-10-CM | POA: Diagnosis not present

## 2016-10-23 DIAGNOSIS — C155 Malignant neoplasm of lower third of esophagus: Secondary | ICD-10-CM | POA: Diagnosis present

## 2016-10-23 DIAGNOSIS — Z510 Encounter for antineoplastic radiation therapy: Secondary | ICD-10-CM | POA: Insufficient documentation

## 2016-10-24 ENCOUNTER — Encounter (HOSPITAL_COMMUNITY): Payer: Self-pay | Admitting: *Deleted

## 2016-10-24 ENCOUNTER — Telehealth: Payer: Self-pay | Admitting: Hematology

## 2016-10-24 ENCOUNTER — Ambulatory Visit (HOSPITAL_COMMUNITY)
Admission: RE | Admit: 2016-10-24 | Discharge: 2016-10-24 | Disposition: A | Discharge status: Home / Self Care | Payer: Medicaid Other | Source: Ambulatory Visit | Attending: Hematology | Admitting: Hematology

## 2016-10-24 DIAGNOSIS — K228 Other specified diseases of esophagus: Secondary | ICD-10-CM | POA: Insufficient documentation

## 2016-10-24 DIAGNOSIS — C155 Malignant neoplasm of lower third of esophagus: Secondary | ICD-10-CM | POA: Insufficient documentation

## 2016-10-24 LAB — GLUCOSE, CAPILLARY: GLUCOSE-CAPILLARY: 102 mg/dL — AB (ref 65–99)

## 2016-10-24 MED ORDER — FLUDEOXYGLUCOSE F - 18 (FDG) INJECTION
7.3200 | Freq: Once | INTRAVENOUS | Status: AC | PRN
  Administered 2016-10-24: 7.32 via INTRAVENOUS

## 2016-10-24 NOTE — Telephone Encounter (Signed)
SW PT TO CONFIRM 3/28 APPTS AT 4 PM PER LOS. PT TO GET UPDATED SCHEDULE AT VISIT

## 2016-10-25 ENCOUNTER — Telehealth: Payer: Self-pay

## 2016-10-25 ENCOUNTER — Ambulatory Visit (HOSPITAL_BASED_OUTPATIENT_CLINIC_OR_DEPARTMENT_OTHER): Payer: Medicaid Other | Admitting: Hematology

## 2016-10-25 ENCOUNTER — Encounter: Payer: Self-pay | Admitting: Hematology

## 2016-10-25 ENCOUNTER — Telehealth: Payer: Self-pay | Admitting: Hematology

## 2016-10-25 VITALS — BP 132/76 | HR 79 | Temp 98.8°F | Resp 18 | Ht 61.0 in | Wt 145.4 lb

## 2016-10-25 DIAGNOSIS — Z87891 Personal history of nicotine dependence: Secondary | ICD-10-CM

## 2016-10-25 DIAGNOSIS — M549 Dorsalgia, unspecified: Secondary | ICD-10-CM | POA: Diagnosis not present

## 2016-10-25 DIAGNOSIS — R070 Pain in throat: Secondary | ICD-10-CM | POA: Diagnosis not present

## 2016-10-25 DIAGNOSIS — R1310 Dysphagia, unspecified: Secondary | ICD-10-CM

## 2016-10-25 DIAGNOSIS — R634 Abnormal weight loss: Secondary | ICD-10-CM | POA: Diagnosis not present

## 2016-10-25 DIAGNOSIS — R109 Unspecified abdominal pain: Secondary | ICD-10-CM | POA: Diagnosis not present

## 2016-10-25 DIAGNOSIS — C155 Malignant neoplasm of lower third of esophagus: Secondary | ICD-10-CM

## 2016-10-25 MED ORDER — ONDANSETRON HCL 8 MG PO TABS: 8.0000 mg | ORAL_TABLET | Freq: Two times a day (BID) | ORAL | 1 refills | Status: DC | PRN

## 2016-10-25 MED ORDER — OSELTAMIVIR PHOSPHATE 75 MG PO CAPS: 75.0000 mg | ORAL_CAPSULE | Freq: Every day | ORAL | 0 refills | Status: AC

## 2016-10-25 MED ORDER — MORPHINE SULFATE (CONCENTRATE) 20 MG/ML PO SOLN
5.0000 mg | Freq: Four times a day (QID) | ORAL | 0 refills | Status: DC | PRN
Start: 2016-10-25 — End: 2016-11-06

## 2016-10-25 MED ORDER — PROCHLORPERAZINE MALEATE 10 MG PO TABS: 10.0000 mg | ORAL_TABLET | Freq: Four times a day (QID) | ORAL | 1 refills | Status: DC | PRN

## 2016-10-25 NOTE — Telephone Encounter (Signed)
Pt does have an appt today at 4 pm with Dr Burr Medico. He just found out he has been exposed to the bad flu through his father. He is asking if he needs to start on Tamiflu right away and will Dr Burr Medico order it,  or wait for his visit with Dr Burr Medico to discuss this. [facility] [address].

## 2016-10-25 NOTE — Telephone Encounter (Signed)
Called to let pt know Dr Burr Medico will discuss when she sees him

## 2016-10-25 NOTE — Telephone Encounter (Signed)
Gave patient AVS and calender per 10/25/2016 los.  

## 2016-10-25 NOTE — Telephone Encounter (Signed)
I will call in when I see him late today   [name] MD

## 2016-10-25 NOTE — Progress Notes (Signed)
[facility]  [phone] [phone]) [phone]  Clinic Follow-up Note   Patient Care Team: [name], MD as PCP - General (Family Medicine) 10/25/2016   CHIEF COMPLAINTS/PURPOSE OF CONSULTATION:  Esophageal adenocarcinoma     Esophageal cancer ([facility])   09/21/2016 - 09/21/2016 Hospital Admission    esophageal pain and vomiting up blood      10/09/2016 Initial Diagnosis    Esophageal cancer ([facility])      10/09/2016 Procedure    EGD 1. Partially obstructing, likely malignant esophageal tumor was found in the lower third of the esophagus. Multiple biopsies.  2. Mass visible during gastric retroflexion at GE junction 3. Otherwise normal stomach 4. Normal examined duodenum       10/09/2016 Pathology Results    Esophagus, biopsy, distal esophageal tumor (33-39) - SUSPICIOUS FOR ADENOCARCINOMA      10/12/2016 Imaging    CT CAP w Contrast IMPRESSION: Distal esophageal mass compatible with primary esophageal malignancy. There are 2 adjacent abnormal appearing subcentimeter paraesophageal lymph nodes which may represent nodal metastasis. Additionally there is a prominent nonspecific 8 mm upper abdominal lymph node.  No evidence for distant metastatic disease in the chest, abdomen or pelvis.      10/24/2016 PET scan    1. Markedly hypermetabolic distal esophageal lesion, compatible with malignancy. Adjacent small paraesophageal lymph nodes are abnormal by CT but cannot be resolved as separate structures from the hypermetabolic esophageal activity on the PET images. No hypermetabolism is demonstrated in the upper abdominal/gastrohepatic ligament lymph node although the small size of this lymph node may be below threshold for detection on PET imaging. 2. No evidence for distant hypermetabolic metastatic disease in the neck, chest, abdomen, or pelvis.      HISTORY OF PRESENTING ILLNESS (10/16/2016):  Darrell Cunningham 56 y.o. male is here because  of an esophageal mass that is suspicious for adenocarcinoma. The pt presented to the ED on 09/21/2016 for esophageal pain and vomiting up blood. He had been struggling with regurgitating food or drinks, difficulty swallowing, and throat pain for 3 weeks prior. He was referred by his PCP to GI who ordered him an EGD with dilation with Dr. [name] as soon as possible. The EGD and biopsy was performed on 10/09/2016, which showed suspicion of adenocarcinoma of the esophagus and a CT scan was ordered. The CT results demonstrated a distal esophageal mass compatible with primary esophageal malignancy. He is here to discuss treatment.   When he has trouble swallowing, he has to stand up and push his head back to get the food down. He has mostly eating oatmeal, Ensure, and other soft foods. He drinks 2-3 Ensure a day. He has cut out coffee and acidic foods to try to relieve his pain. It is not getting better, though. He isn't able to eat much due to this pain and difficulty swallowing. He does not take any pain medication. He has lost about 13 pounds. He also has some left sided abdominal pain. At night, he has a lot of acid reflux and sputum production. Denies constipation, diarrhea, nausea, SOB, or any other concerns.   He has a history of cerebral palsy on his right side. He has some back pain due to a curvature in his spine. History of asthma. His grandmother had breast cancer and colon cancer. His sister passed of breast cancer at age 22. His aunt had ovarian cancer. No other family history of cancer. He quit smoking 4 years ago. 38 pack years, 1-1.5  ppd. He lives at home with his 18 y.o. Daughter. His parents live about 15 minutes away.   CURRENT THERAPY: pending concurrent chemo and radiation, weekly Carboplatin and taxol   INTERVAL HISTORY: The patient returns for follow up. He is doing well overall. He will begin radiation treatment next week. The patient's father is in the hospital with flu type B. The patient  does not live with his father but he did interact with him in the last few days. He reports continued dysphagia and upper throat pain. He sometimes has to lift his head while swallowing food to help it go down. He reports continued back pain and abdominal pain. He continues to drink Ensure/Boost without issue. He has no other concerns or complaints at this time.   MEDICAL HISTORY:  Past Medical History:  Diagnosis Date  . Allergy   . Arthritis   . Asthma    as a child  . 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 Dysphagia   . Hyperlipidemia   . Neuromuscular disorder ([facility])   . Pneumonia 4 yrs ago    SURGICAL HISTORY: Past Surgical History:  Procedure Laterality Date  . EYE SURGERY Bilateral age 1    for cross eyes  . MOUTH SURGERY      SOCIAL HISTORY: Social History   Social History  . Marital status: Married    Spouse name: N/A  . Number of children: N/A  . Years of education: N/A   Occupational History  . Not on file.   Social History Main Topics  . Smoking status: Former Smoker    Packs/day: 1.00    Years: 38.00    Types: Cigarettes    Start date: 10/31/2012    Quit date: 10/31/2012  . Smokeless tobacco: Never Used  . Alcohol use No  . Drug use: No  . Sexual activity: Not on file   Other Topics Concern  . Not on file   Social History Narrative  . No narrative on file    FAMILY HISTORY: Family History  Problem Relation Age of Onset  . Colon cancer Maternal Grandmother   . Cancer Maternal Grandmother     breast cancer   . Cancer Sister 40    Deceased at 44 of breast cancer  . Cancer Maternal Aunt     ovarian cencer   . Multiple myeloma Father   . Stomach cancer Neg Hx   . Esophageal cancer Neg Hx     ALLERGIES:  is allergic to penicillins.  MEDICATIONS:  Current Outpatient Prescriptions  Medication Sig Dispense Refill  . fluticasone (FLONASE) 50 MCG/ACT nasal spray Place 1 spray into both nostrils daily as needed for allergies or rhinitis.     . ibuprofen (ADVIL,MOTRIN) 200 MG tablet Take 800 mg by mouth daily as needed for headache or moderate pain.     . pantoprazole (PROTONIX) 40 MG tablet Take 1 tablet (40 mg total) by mouth 2 (two) times daily. 60 tablet 3  . PROAIR HFA 108 (90 Base) MCG/ACT inhaler INHALE 2 PUFFS INTO THE LUNGS EVERY 6 (SIX) HOURS AS NEEDED FOR WHEEZING OR SHORTNESS OF BREATH. 18 g 0  . sucralfate (CARAFATE) 1 GM/10ML suspension Take 10 mLs (1 g total) by mouth 4 (four) times daily -  with meals and at bedtime. 420 mL 2  . tiZANidine (ZANAFLEX) 4 MG tablet TAKE 1 TABLET (4 MG TOTAL) BY MOUTH EVERY 6 (SIX) HOURS AS NEEDED FOR MUSCLE SPASMS. 30 tablet 0  .   morphine (ROXANOL) 20 MG/ML concentrated solution Take 0.25 mLs (5 mg total) by mouth every 6 (six) hours as needed for severe pain. 15 mL 0  . ondansetron (ZOFRAN) 8 MG tablet Take 1 tablet (8 mg total) by mouth 2 (two) times daily as needed for refractory nausea / vomiting. Start on day 3 after chemo. 30 tablet 1  . oseltamivir (TAMIFLU) 75 MG capsule Take 1 capsule (75 mg total) by mouth daily. 10 capsule 0  . prochlorperazine (COMPAZINE) 10 MG tablet Take 1 tablet (10 mg total) by mouth every 6 (six) hours as needed (Nausea or vomiting). 30 tablet 1   Current Facility-Administered Medications  Medication Dose Route Frequency Provider Last Rate Last Dose  . 0.9 %  sodium chloride infusion  500 mL Intravenous Continuous [name], MD        REVIEW OF SYSTEMS:   Constitutional: Denies fevers, chills or abnormal night sweats (+) dysphagia Eyes: Denies blurriness of vision, double vision or watery eyes Ears, nose, mouth, throat, and face: Denies mucositis or sore throat  Respiratory: Denies cough, dyspnea or wheezes  Cardiovascular: Denies palpitation, chest discomfort or lower extremity swelling Gastrointestinal:  Denies nausea, heartburn or change in bowel habits (+) left sided abdominal pain (+) acid reflux Skin: Denies abnormal skin  rashes Lymphatics: Denies new lymphadenopathy or easy bruising Neurological:Denies numbness, tingling or new weaknesses (+) cerebral palsy of right side Musculoskeletal: (+) lower back pain Behavioral/Psych: Mood is stable, no new changes  All other systems were reviewed with the patient and are negative.  PHYSICAL EXAMINATION: ECOG PERFORMANCE STATUS: 1 - Symptomatic but completely ambulatory  Vitals:   10/25/16 1611  BP: 132/76  Pulse: 79  Resp: 18  Temp: 98.8 F (37.1 C)   Filed Weights   10/25/16 1611  Weight: 145 lb 6.4 oz (66 kg)   GENERAL:alert, no distress and comfortable (+) limp from right sided cerebral palsy (+) weight loss of about 2 lbs since 10/19/16 SKIN: skin color, texture, turgor are normal, no rashes or significant lesions EYES: normal, conjunctiva are pink and non-injected, sclera clear OROPHARYNX:no exudate, no erythema and lips, buccal mucosa, and tongue normal  NECK: supple, thyroid normal size, non-tender, without nodularity LYMPH:  no palpable lymphadenopathy in the cervical, axillary or inguinal LUNGS: clear to auscultation and percussion with normal breathing effort (+) wheezing right lung HEART: regular rate & rhythm and no murmurs and no lower extremity edema ABDOMEN:abdomen soft, non-tender and normal bowel sounds (+) tenderness  Musculoskeletal:no cyanosis of digits and no clubbing  PSYCH: alert & oriented x 3 with fluent speech NEURO: no focal motor/sensory deficits  LABORATORY DATA:  I have reviewed the data as listed CBC Latest Ref Rng & Units 09/21/2016 09/21/2016 06/17/2014  WBC 4.0 - 10.5 K/uL 9.1 6.6 6.6  Hemoglobin 13.0 - 17.0 g/dL 13.2 13.4 13.8  Hematocrit 39.0 - 52.0 % 39.6 41.2 40.7  Platelets 150 - 400 K/uL 210 215 222   CMP Latest Ref Rng & Units 09/21/2016 09/21/2016 10/27/2014  Glucose 65 - 99 mg/dL 104(H) 95 110(H)  BUN 6 - 20 mg/dL _0 Creatinine 0.61 - 1.24 mg/dL 0.73 0.72 0.61  Sodium 135 - 145 mmol/L 137 140 136   Potassium 3.5 - 5.1 mmol/L 3.7 4.2 4.1  Chloride 101 - 111 mmol/L 103 105 106  CO2 22 - 32 mmol/L _1 Calcium 8.9 - 10.3 mg/dL 9.2 9.3 9.3  Total Protein 6.5 - 8.1 g/dL 7.5 7.1 7.1  Total Bilirubin 0.3 - 1.2 mg/dL 0.4 0.4 0.4  Alkaline Phos 38 - 126 U/L 80 81 77  AST 15 - 41 U/L 19 18 20  ALT 17 - 63 U/L 28 26 27   PATHOLOGY:  Diagnosis 10/09/2016 Esophagus, biopsy, distal esophageal tumor (33-39) - SUSPICIOUS FOR ADENOCARCINOMA, SEE COMMENT. Microscopic Comment The majority of fragments are reactive squamous mucosa with associated acute inflammation. There is a small fragment of atypical glandular epithelium with associated ulceration that is suspicious for adenocarcinoma. There is no background Barrett's esophagus in the glandular component. Dr. Patrick has reviewed the case.  RADIOGRAPHIC STUDIES: I have personally reviewed the radiological images as listed and agreed with the findings in the report.  PET 10/24/2016 IMPRESSION: 1. Markedly hypermetabolic distal esophageal lesion, compatible with malignancy. Adjacent small paraesophageal lymph nodes are abnormal by CT but cannot be resolved as separate structures from the hypermetabolic esophageal activity on the PET images. No hypermetabolism is demonstrated in the upper abdominal/gastrohepatic ligament lymph node although the small size of this lymph node may be below threshold for detection on PET imaging. 2. No evidence for distant hypermetabolic metastatic disease in the neck, chest, abdomen, or pelvis.  CT CAP w Contrast 10/12/2016 IMPRESSION: Distal esophageal mass compatible with primary esophageal malignancy. There are 2 adjacent abnormal appearing subcentimeter paraesophageal lymph nodes which may represent nodal metastasis. Additionally there is a prominent nonspecific 8 mm upper abdominal lymph node.  No evidence for distant metastatic disease in the chest, abdomen or pelvis.  EGD  10/09/2016   ASSESSMENT & PLAN:  Darrell Cunningham is a 55 y.o. Caucasian male with PMH cerebral palsy and heavy smoking, who presents with dysphagia, odynophagia and weight loss   1. Low esophageal Adenocarcinoma, [id number] -I reviewed the imaging and biopsy pathology results with the patient in detail. Although his biopsy pathology was not definitive for malignancy, based on his endoscopy findings, this is most consistent with esophageal adenocarcinoma. -I reviewed his PET scan images with patient in person, which showed intense hypermetabolic esophageal cancer, no other distant metastasis -He is scheduled to have EUS by Dr. Jacobs tomorrow for staging. Based on the current CT and PET scan, this is likely locally advanced disease. -We reviewed the standard care for locally advanced esophageal cancer, including neoadjuvant chemoradiation, followed by esophagectomy.  -He was previously referred to see thoracic surgeon Dr. Gearhart. -He will begin radiation treatment next week.  -I recommend him to have concurrent chemotherapy with weekly carboplatin AUC 2 and Taxol 45mg/m2  --Chemotherapy consent: Side effects including but does not not limited to, fatigue, nausea, vomiting, diarrhea, hair loss, neuropathy, fluid retention, renal and kidney dysfunction, neutropenic fever, needed for blood transfusion, bleeding, were discussed with patient in great detail. He agrees to proceed. -The goal of therapy is curative -I encouraged him to drink plenty of water, eat soft foods, and eat slowly. I'll refer him to our dietitian for nutrition consult -If he needs neoadjuvant chemoradiation, I recommend weekly carboplatin and Taxol with concurrent radiation. He is likely a candidate for surgical resection. -He will return for chemotherapy teaching prior to starting chemotherapy. -He will proceed with EUS tomorrow with Dr. Jacobs. -I prescribed zofran and compazine today  2. Dysphagia, odynophagia and weight  loss -Difficulty eating due to esophageal pain -He can eat soft foods, which occasionally get stuck -Drinks 2-3 Ensure a day -He will meet with our dietician  -We discussed that he may need a feeding tube for nutrition supplement, if his dysphagia gets worse -I have   written prescription for liquid morphine for esophageal pain  3. History of heavy smoking -He has quit smoking completely,history of 40 pack year.   4. Flu exposure -The patient's father was recently diagnosed with Type B flu and the patient interacted with him. -I will prescribe Tamiflu today  Plan.  -He will proceed with EUS tomorrow with Dr. Jacobs. -He will begin radiation treatment next week. -I prescribed Tamiflu 75mg daily X10 today -I prescribed zofran and compazine today -I have written prescription for liquid morphine 5mg q6h as needed for esophageal pain -He will be scheduled for chemotherapy teaching next Monday  -Return for f/u in 2 weeks  -lab weekly    All questions were answered. The patient knows to call the clinic with any problems, questions or concerns.  I spent 25 minutes counseling the patient face to face. The total time spent in the appointment was 30 minutes and more than 50% was on counseling.  This document serves as a record of services personally performed by Yan Feng, MD. It was created on her behalf by Elizabeth Ashley, a trained medical scribe. The creation of this record is based on the scribe's personal observations and the provider's statements to them. This document has been checked and approved by the attending provider.  I have reviewed the above documentation for accuracy and completeness and I agree with the above.   Feng, Yan, MD 10/25/2016   

## 2016-10-26 ENCOUNTER — Ambulatory Visit (HOSPITAL_COMMUNITY): Payer: Medicaid Other | Admitting: Registered Nurse

## 2016-10-26 ENCOUNTER — Encounter (HOSPITAL_COMMUNITY): Payer: Self-pay | Admitting: *Deleted

## 2016-10-26 ENCOUNTER — Encounter (HOSPITAL_COMMUNITY)
Admission: RE | Disposition: A | Discharge status: Home / Self Care | Payer: Self-pay | Source: Ambulatory Visit | Attending: Gastroenterology

## 2016-10-26 ENCOUNTER — Ambulatory Visit (HOSPITAL_COMMUNITY)
Admission: RE | Admit: 2016-10-26 | Discharge: 2016-10-26 | Disposition: A | Discharge status: Home / Self Care | Payer: Medicaid Other | Source: Ambulatory Visit | Attending: Gastroenterology | Admitting: Gastroenterology

## 2016-10-26 ENCOUNTER — Other Ambulatory Visit: Payer: Self-pay | Admitting: Hematology

## 2016-10-26 DIAGNOSIS — Z807 Family history of other malignant neoplasms of lymphoid, hematopoietic and related tissues: Secondary | ICD-10-CM | POA: Diagnosis not present

## 2016-10-26 DIAGNOSIS — C159 Malignant neoplasm of esophagus, unspecified: Secondary | ICD-10-CM | POA: Diagnosis not present

## 2016-10-26 DIAGNOSIS — Z791 Long term (current) use of non-steroidal anti-inflammatories (NSAID): Secondary | ICD-10-CM | POA: Diagnosis not present

## 2016-10-26 DIAGNOSIS — K219 Gastro-esophageal reflux disease without esophagitis: Secondary | ICD-10-CM | POA: Insufficient documentation

## 2016-10-26 DIAGNOSIS — G802 Spastic hemiplegic cerebral palsy: Secondary | ICD-10-CM | POA: Insufficient documentation

## 2016-10-26 DIAGNOSIS — K228 Other specified diseases of esophagus: Secondary | ICD-10-CM | POA: Insufficient documentation

## 2016-10-26 DIAGNOSIS — Z20828 Contact with and (suspected) exposure to other viral communicable diseases: Secondary | ICD-10-CM | POA: Insufficient documentation

## 2016-10-26 DIAGNOSIS — C160 Malignant neoplasm of cardia: Secondary | ICD-10-CM | POA: Diagnosis not present

## 2016-10-26 DIAGNOSIS — R633 Feeding difficulties: Secondary | ICD-10-CM | POA: Insufficient documentation

## 2016-10-26 DIAGNOSIS — Z7951 Long term (current) use of inhaled steroids: Secondary | ICD-10-CM | POA: Diagnosis not present

## 2016-10-26 DIAGNOSIS — E785 Hyperlipidemia, unspecified: Secondary | ICD-10-CM | POA: Diagnosis not present

## 2016-10-26 DIAGNOSIS — Z803 Family history of malignant neoplasm of breast: Secondary | ICD-10-CM | POA: Insufficient documentation

## 2016-10-26 DIAGNOSIS — Z9889 Other specified postprocedural states: Secondary | ICD-10-CM | POA: Diagnosis not present

## 2016-10-26 DIAGNOSIS — Z880 Allergy status to penicillin: Secondary | ICD-10-CM | POA: Diagnosis not present

## 2016-10-26 DIAGNOSIS — Z87891 Personal history of nicotine dependence: Secondary | ICD-10-CM | POA: Diagnosis not present

## 2016-10-26 DIAGNOSIS — Z79899 Other long term (current) drug therapy: Secondary | ICD-10-CM | POA: Insufficient documentation

## 2016-10-26 DIAGNOSIS — J45909 Unspecified asthma, uncomplicated: Secondary | ICD-10-CM | POA: Insufficient documentation

## 2016-10-26 DIAGNOSIS — Z510 Encounter for antineoplastic radiation therapy: Secondary | ICD-10-CM | POA: Diagnosis not present

## 2016-10-26 DIAGNOSIS — R1310 Dysphagia, unspecified: Secondary | ICD-10-CM | POA: Diagnosis not present

## 2016-10-26 DIAGNOSIS — Z800 Family history of malignant neoplasm of digestive organs: Secondary | ICD-10-CM | POA: Diagnosis not present

## 2016-10-26 HISTORY — DX: Dysphagia, unspecified: R13.10

## 2016-10-26 HISTORY — PX: EUS: SHX5427

## 2016-10-26 HISTORY — DX: Pneumonia, unspecified organism: J18.9

## 2016-10-26 SURGERY — UPPER ENDOSCOPIC ULTRASOUND (EUS) LINEAR
Anesthesia: Monitor Anesthesia Care

## 2016-10-26 MED ORDER — LACTATED RINGERS IV SOLN
INTRAVENOUS | Status: DC
  Administered 2016-10-26: 1000 mL via INTRAVENOUS

## 2016-10-26 MED ORDER — PROPOFOL 500 MG/50ML IV EMUL
INTRAVENOUS | Status: DC | PRN
  Administered 2016-10-26: 100 ug/kg/min via INTRAVENOUS

## 2016-10-26 MED ORDER — PROPOFOL 10 MG/ML IV BOLUS
INTRAVENOUS | Status: DC | PRN
  Administered 2016-10-26: 20 mg via INTRAVENOUS
  Administered 2016-10-26: 30 mg via INTRAVENOUS

## 2016-10-26 MED ORDER — LIDOCAINE 2% (20 MG/ML) 5 ML SYRINGE
INTRAMUSCULAR | Status: DC | PRN
  Administered 2016-10-26: 100 mg via INTRAVENOUS

## 2016-10-26 MED ORDER — ONDANSETRON HCL 4 MG/2ML IJ SOLN
INTRAMUSCULAR | Status: DC | PRN
  Administered 2016-10-26: 4 mg via INTRAVENOUS

## 2016-10-26 MED ORDER — SODIUM CHLORIDE 0.9 % IV SOLN: INTRAVENOUS | Status: DC

## 2016-10-26 MED ORDER — PROPOFOL 10 MG/ML IV BOLUS
INTRAVENOUS | Status: AC
  Filled 2016-10-26: qty 60

## 2016-10-26 MED ORDER — ONDANSETRON HCL 4 MG/2ML IJ SOLN
INTRAMUSCULAR | Status: AC
  Filled 2016-10-26: qty 2

## 2016-10-26 MED ORDER — LIDOCAINE 2% (20 MG/ML) 5 ML SYRINGE
INTRAMUSCULAR | Status: AC
  Filled 2016-10-26: qty 5

## 2016-10-26 NOTE — Interval H&P Note (Signed)
History and Physical Interval Note:  10/26/2016 10:40 AM  Darrell Cunningham  has presented today for surgery, with the diagnosis of esophageal cancer staging   The various methods of treatment have been discussed with the patient and family. After consideration of risks, benefits and other options for treatment, the patient has consented to  Procedure(s): UPPER ENDOSCOPIC ULTRASOUND (EUS) LINEAR (N/A) as a surgical intervention .  The patient's history has been reviewed, patient examined, no change in status, stable for surgery.  I have reviewed the patient's chart and labs.  Questions were answered to the patient's satisfaction.     [name]

## 2016-10-26 NOTE — H&P (View-Only) (Signed)
[facility]  [phone] [phone]  Clinic Follow-up Note   Patient Care Team: Warren T Pickard, MD as PCP - General (Family Medicine) 10/25/2016   CHIEF COMPLAINTS/PURPOSE OF CONSULTATION:  Esophageal adenocarcinoma     Esophageal cancer (HCC)   09/21/2016 - 09/21/2016 Hospital Admission    esophageal pain and vomiting up blood      10/09/2016 Initial Diagnosis    Esophageal cancer (HCC)      10/09/2016 Procedure    EGD 1. Partially obstructing, likely malignant esophageal tumor was found in the lower third of the esophagus. Multiple biopsies.  2. Mass visible during gastric retroflexion at GE junction 3. Otherwise normal stomach 4. Normal examined duodenum       10/09/2016 Pathology Results    Esophagus, biopsy, distal esophageal tumor (33-39) - SUSPICIOUS FOR ADENOCARCINOMA      10/12/2016 Imaging    CT CAP w Contrast IMPRESSION: Distal esophageal mass compatible with primary esophageal malignancy. There are 2 adjacent abnormal appearing subcentimeter paraesophageal lymph nodes which may represent nodal metastasis. Additionally there is a prominent nonspecific 8 mm upper abdominal lymph node.  No evidence for distant metastatic disease in the chest, abdomen or pelvis.      10/24/2016 PET scan    1. Markedly hypermetabolic distal esophageal lesion, compatible with malignancy. Adjacent small paraesophageal lymph nodes are abnormal by CT but cannot be resolved as separate structures from the hypermetabolic esophageal activity on the PET images. No hypermetabolism is demonstrated in the upper abdominal/gastrohepatic ligament lymph node although the small size of this lymph node may be below threshold for detection on PET imaging. 2. No evidence for distant hypermetabolic metastatic disease in the neck, chest, abdomen, or pelvis.      HISTORY OF PRESENTING ILLNESS (10/16/2016):  Darrell Cunningham 56 y.o. male is here because  of an esophageal mass that is suspicious for adenocarcinoma. The pt presented to the ED on 09/21/2016 for esophageal pain and vomiting up blood. He had been struggling with regurgitating food or drinks, difficulty swallowing, and throat pain for 3 weeks prior. He was referred by his PCP to GI who ordered him an EGD with dilation with Dr. Pyrtle as soon as possible. The EGD and biopsy was performed on 10/09/2016, which showed suspicion of adenocarcinoma of the esophagus and a CT scan was ordered. The CT results demonstrated a distal esophageal mass compatible with primary esophageal malignancy. He is here to discuss treatment.   When he has trouble swallowing, he has to stand up and push his head back to get the food down. He has mostly eating oatmeal, Ensure, and other soft foods. He drinks 2-3 Ensure a day. He has cut out coffee and acidic foods to try to relieve his pain. It is not getting better, though. He isn't able to eat much due to this pain and difficulty swallowing. He does not take any pain medication. He has lost about 13 pounds. He also has some left sided abdominal pain. At night, he has a lot of acid reflux and sputum production. Denies constipation, diarrhea, nausea, SOB, or any other concerns.   He has a history of cerebral palsy on his right side. He has some back pain due to a curvature in his spine. History of asthma. His grandmother had breast cancer and colon cancer. His sister passed of breast cancer at age 44. His aunt had ovarian cancer. No other family history of cancer. He quit smoking 4 years ago. 38 pack years, 1-1.5   ppd. He lives at home with his 18 y.o. Daughter. His parents live about 15 minutes away.   CURRENT THERAPY: pending concurrent chemo and radiation, weekly Carboplatin and taxol   INTERVAL HISTORY: The patient returns for follow up. He is doing well overall. He will begin radiation treatment next week. The patient's father is in the hospital with flu type B. The patient  does not live with his father but he did interact with him in the last few days. He reports continued dysphagia and upper throat pain. He sometimes has to lift his head while swallowing food to help it go down. He reports continued back pain and abdominal pain. He continues to drink Ensure/Boost without issue. He has no other concerns or complaints at this time.   MEDICAL HISTORY:  Past Medical History:  Diagnosis Date  . Allergy   . Arthritis   . Asthma    as a child  . 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 Dysphagia   . Hyperlipidemia   . Neuromuscular disorder (HCC)   . Pneumonia 4 yrs ago    SURGICAL HISTORY: Past Surgical History:  Procedure Laterality Date  . EYE SURGERY Bilateral age 1    for cross eyes  . MOUTH SURGERY      SOCIAL HISTORY: Social History   Social History  . Marital status: Married    Spouse name: N/A  . Number of children: N/A  . Years of education: N/A   Occupational History  . Not on file.   Social History Main Topics  . Smoking status: Former Smoker    Packs/day: 1.00    Years: 38.00    Types: Cigarettes    Start date: 10/31/2012    Quit date: 10/31/2012  . Smokeless tobacco: Never Used  . Alcohol use No  . Drug use: No  . Sexual activity: Not on file   Other Topics Concern  . Not on file   Social History Narrative  . No narrative on file    FAMILY HISTORY: Family History  Problem Relation Age of Onset  . Colon cancer Maternal Grandmother   . Cancer Maternal Grandmother     breast cancer   . Cancer Sister 40    Deceased at 44 of breast cancer  . Cancer Maternal Aunt     ovarian cencer   . Multiple myeloma Father   . Stomach cancer Neg Hx   . Esophageal cancer Neg Hx     ALLERGIES:  is allergic to penicillins.  MEDICATIONS:  Current Outpatient Prescriptions  Medication Sig Dispense Refill  . fluticasone (FLONASE) 50 MCG/ACT nasal spray Place 1 spray into both nostrils daily as needed for allergies or rhinitis.     . ibuprofen (ADVIL,MOTRIN) 200 MG tablet Take 800 mg by mouth daily as needed for headache or moderate pain.     . pantoprazole (PROTONIX) 40 MG tablet Take 1 tablet (40 mg total) by mouth 2 (two) times daily. 60 tablet 3  . PROAIR HFA 108 (90 Base) MCG/ACT inhaler INHALE 2 PUFFS INTO THE LUNGS EVERY 6 (SIX) HOURS AS NEEDED FOR WHEEZING OR SHORTNESS OF BREATH. 18 g 0  . sucralfate (CARAFATE) 1 GM/10ML suspension Take 10 mLs (1 g total) by mouth 4 (four) times daily -  with meals and at bedtime. 420 mL 2  . tiZANidine (ZANAFLEX) 4 MG tablet TAKE 1 TABLET (4 MG TOTAL) BY MOUTH EVERY 6 (SIX) HOURS AS NEEDED FOR MUSCLE SPASMS. 30 tablet 0  .   morphine (ROXANOL) 20 MG/ML concentrated solution Take 0.25 mLs (5 mg total) by mouth every 6 (six) hours as needed for severe pain. 15 mL 0  . ondansetron (ZOFRAN) 8 MG tablet Take 1 tablet (8 mg total) by mouth 2 (two) times daily as needed for refractory nausea / vomiting. Start on day 3 after chemo. 30 tablet 1  . oseltamivir (TAMIFLU) 75 MG capsule Take 1 capsule (75 mg total) by mouth daily. 10 capsule 0  . prochlorperazine (COMPAZINE) 10 MG tablet Take 1 tablet (10 mg total) by mouth every 6 (six) hours as needed (Nausea or vomiting). 30 tablet 1   Current Facility-Administered Medications  Medication Dose Route Frequency Provider Last Rate Last Dose  . 0.9 %  sodium chloride infusion  500 mL Intravenous Continuous Jay M Pyrtle, MD        REVIEW OF SYSTEMS:   Constitutional: Denies fevers, chills or abnormal night sweats (+) dysphagia Eyes: Denies blurriness of vision, double vision or watery eyes Ears, nose, mouth, throat, and face: Denies mucositis or sore throat  Respiratory: Denies cough, dyspnea or wheezes  Cardiovascular: Denies palpitation, chest discomfort or lower extremity swelling Gastrointestinal:  Denies nausea, heartburn or change in bowel habits (+) left sided abdominal pain (+) acid reflux Skin: Denies abnormal skin  rashes Lymphatics: Denies new lymphadenopathy or easy bruising Neurological:Denies numbness, tingling or new weaknesses (+) cerebral palsy of right side Musculoskeletal: (+) lower back pain Behavioral/Psych: Mood is stable, no new changes  All other systems were reviewed with the patient and are negative.  PHYSICAL EXAMINATION: ECOG PERFORMANCE STATUS: 1 - Symptomatic but completely ambulatory  Vitals:   10/25/16 1611  BP: 132/76  Pulse: 79  Resp: 18  Temp: 98.8 F (37.1 C)   Filed Weights   10/25/16 1611  Weight: 145 lb 6.4 oz (66 kg)   GENERAL:alert, no distress and comfortable (+) limp from right sided cerebral palsy (+) weight loss of about 2 lbs since 10/19/16 SKIN: skin color, texture, turgor are normal, no rashes or significant lesions EYES: normal, conjunctiva are pink and non-injected, sclera clear OROPHARYNX:no exudate, no erythema and lips, buccal mucosa, and tongue normal  NECK: supple, thyroid normal size, non-tender, without nodularity LYMPH:  no palpable lymphadenopathy in the cervical, axillary or inguinal LUNGS: clear to auscultation and percussion with normal breathing effort (+) wheezing right lung HEART: regular rate & rhythm and no murmurs and no lower extremity edema ABDOMEN:abdomen soft, non-tender and normal bowel sounds (+) tenderness  Musculoskeletal:no cyanosis of digits and no clubbing  PSYCH: alert & oriented x 3 with fluent speech NEURO: no focal motor/sensory deficits  LABORATORY DATA:  I have reviewed the data as listed CBC Latest Ref Rng & Units 09/21/2016 09/21/2016 06/17/2014  WBC 4.0 - 10.5 K/uL 9.1 6.6 6.6  Hemoglobin 13.0 - 17.0 g/dL 13.2 13.4 13.8  Hematocrit 39.0 - 52.0 % 39.6 41.2 40.7  Platelets 150 - 400 K/uL 210 215 222   CMP Latest Ref Rng & Units 09/21/2016 09/21/2016 10/27/2014  Glucose 65 - 99 mg/dL 104(H) 95 110(H)  BUN 6 - 20 mg/dL 17 14 12  Creatinine 0.61 - 1.24 mg/dL 0.73 0.72 0.61  Sodium 135 - 145 mmol/L 137 140 136   Potassium 3.5 - 5.1 mmol/L 3.7 4.2 4.1  Chloride 101 - 111 mmol/L 103 105 106  CO2 22 - 32 mmol/L 26 26 24  Calcium 8.9 - 10.3 mg/dL 9.2 9.3 9.3  Total Protein 6.5 - 8.1 g/dL 7.5 7.1 7.1    Total Bilirubin 0.3 - 1.2 mg/dL 0.4 0.4 0.4  Alkaline Phos 38 - 126 U/L 80 81 77  AST 15 - 41 U/L 19 18 20  ALT 17 - 63 U/L 28 26 27   PATHOLOGY:  Diagnosis 10/09/2016 Esophagus, biopsy, distal esophageal tumor (33-39) - SUSPICIOUS FOR ADENOCARCINOMA, SEE COMMENT. Microscopic Comment The majority of fragments are reactive squamous mucosa with associated acute inflammation. There is a small fragment of atypical glandular epithelium with associated ulceration that is suspicious for adenocarcinoma. There is no background Barrett's esophagus in the glandular component. Dr. Patrick has reviewed the case.  RADIOGRAPHIC STUDIES: I have personally reviewed the radiological images as listed and agreed with the findings in the report.  PET 10/24/2016 IMPRESSION: 1. Markedly hypermetabolic distal esophageal lesion, compatible with malignancy. Adjacent small paraesophageal lymph nodes are abnormal by CT but cannot be resolved as separate structures from the hypermetabolic esophageal activity on the PET images. No hypermetabolism is demonstrated in the upper abdominal/gastrohepatic ligament lymph node although the small size of this lymph node may be below threshold for detection on PET imaging. 2. No evidence for distant hypermetabolic metastatic disease in the neck, chest, abdomen, or pelvis.  CT CAP w Contrast 10/12/2016 IMPRESSION: Distal esophageal mass compatible with primary esophageal malignancy. There are 2 adjacent abnormal appearing subcentimeter paraesophageal lymph nodes which may represent nodal metastasis. Additionally there is a prominent nonspecific 8 mm upper abdominal lymph node.  No evidence for distant metastatic disease in the chest, abdomen or pelvis.  EGD  10/09/2016   ASSESSMENT & PLAN:  Darrell Cunningham is a 55 y.o. Caucasian male with PMH cerebral palsy and heavy smoking, who presents with dysphagia, odynophagia and weight loss   1. Low esophageal Adenocarcinoma, [id number] -I reviewed the imaging and biopsy pathology results with the patient in detail. Although his biopsy pathology was not definitive for malignancy, based on his endoscopy findings, this is most consistent with esophageal adenocarcinoma. -I reviewed his PET scan images with patient in person, which showed intense hypermetabolic esophageal cancer, no other distant metastasis -He is scheduled to have EUS by Dr. Jacobs tomorrow for staging. Based on the current CT and PET scan, this is likely locally advanced disease. -We reviewed the standard care for locally advanced esophageal cancer, including neoadjuvant chemoradiation, followed by esophagectomy.  -He was previously referred to see thoracic surgeon Dr. Gearhart. -He will begin radiation treatment next week.  -I recommend him to have concurrent chemotherapy with weekly carboplatin AUC 2 and Taxol 45mg/m2  --Chemotherapy consent: Side effects including but does not not limited to, fatigue, nausea, vomiting, diarrhea, hair loss, neuropathy, fluid retention, renal and kidney dysfunction, neutropenic fever, needed for blood transfusion, bleeding, were discussed with patient in great detail. He agrees to proceed. -The goal of therapy is curative -I encouraged him to drink plenty of water, eat soft foods, and eat slowly. I'll refer him to our dietitian for nutrition consult -If he needs neoadjuvant chemoradiation, I recommend weekly carboplatin and Taxol with concurrent radiation. He is likely a candidate for surgical resection. -He will return for chemotherapy teaching prior to starting chemotherapy. -He will proceed with EUS tomorrow with Dr. Jacobs. -I prescribed zofran and compazine today  2. Dysphagia, odynophagia and weight  loss -Difficulty eating due to esophageal pain -He can eat soft foods, which occasionally get stuck -Drinks 2-3 Ensure a day -He will meet with our dietician  -We discussed that he may need a feeding tube for nutrition supplement, if his dysphagia gets worse -I have   written prescription for liquid morphine for esophageal pain  3. History of heavy smoking -He has quit smoking completely,history of 40 pack year.   4. Flu exposure -The patient's father was recently diagnosed with Type B flu and the patient interacted with him. -I will prescribe Tamiflu today  Plan.  -He will proceed with EUS tomorrow with Dr. Jacobs. -He will begin radiation treatment next week. -I prescribed Tamiflu 75mg daily X10 today -I prescribed zofran and compazine today -I have written prescription for liquid morphine 5mg q6h as needed for esophageal pain -He will be scheduled for chemotherapy teaching next Monday  -Return for f/u in 2 weeks  -lab weekly    All questions were answered. The patient knows to call the clinic with any problems, questions or concerns.  I spent 25 minutes counseling the patient face to face. The total time spent in the appointment was 30 minutes and more than 50% was on counseling.  This document serves as a record of services personally performed by Janeann Paisley, MD. It was created on her behalf by Elizabeth Ashley, a trained medical scribe. The creation of this record is based on the scribe's personal observations and the provider's statements to them. This document has been checked and approved by the attending provider.  I have reviewed the above documentation for accuracy and completeness and I agree with the above.   Sonnie Pawloski, MD 10/25/2016   

## 2016-10-26 NOTE — Anesthesia Preprocedure Evaluation (Signed)
Anesthesia Evaluation  Patient identified by MRN, date of birth, ID band Patient awake    History of Anesthesia Complications Negative for: history of anesthetic complications  Airway Mallampati: III  TM Distance: >3 FB Neck ROM: Full  Mouth opening: Limited Mouth Opening  Dental  (+) Edentulous Upper   Pulmonary COPD, former smoker,    breath sounds clear to auscultation       Cardiovascular negative cardio ROS   Rhythm:Regular Rate:Normal     Neuro/Psych    GI/Hepatic Neg liver ROS, esophagealca   Endo/Other  negative endocrine ROS  Renal/GU negative Renal ROS     Musculoskeletal  (+) Arthritis ,   Abdominal   Peds  Hematology   Anesthesia Other Findings   Reproductive/Obstetrics                             Anesthesia Physical Anesthesia Plan  ASA: III  Anesthesia Plan: MAC   Post-op Pain Management:    Induction: Intravenous  Airway Management Planned: Natural Airway and Nasal Cannula  Additional Equipment:   Intra-op Plan:   Post-operative Plan:   Informed Consent: I have reviewed the patients History and Physical, chart, labs and discussed the procedure including the risks, benefits and alternatives for the proposed anesthesia with the patient or authorized representative who has indicated his/her understanding and acceptance.     Plan Discussed with: CRNA  Anesthesia Plan Comments:         Anesthesia Quick Evaluation

## 2016-10-26 NOTE — Discharge Instructions (Signed)
YOU HAD AN ENDOSCOPIC PROCEDURE TODAY: Refer to the procedure report and other information in the discharge instructions given to you for any specific questions about what was found during the examination. If this information does not answer your questions, please call [facility] office at [phone] to clarify.  ° °YOU SHOULD EXPECT: Some feelings of bloating in the abdomen. Passage of more gas than usual. Walking can help get rid of the air that was put into your GI tract during the procedure and reduce the bloating. If you had a lower endoscopy (such as a colonoscopy or flexible sigmoidoscopy) you may notice spotting of blood in your stool or on the toilet paper. Some abdominal soreness may be present for a day or two, also. ° °DIET: Your first meal following the procedure should be a light meal and then it is ok to progress to your normal diet. A half-sandwich or bowl of soup is an example of a good first meal. Heavy or fried foods are harder to digest and may make you feel nauseous or bloated. Drink plenty of fluids but you should avoid alcoholic beverages for 24 hours. If you had a esophageal dilation, please see attached instructions for diet.   ° °ACTIVITY: Your care partner should take you home directly after the procedure. You should plan to take it easy, moving slowly for the rest of the day. You can resume normal activity the day after the procedure however YOU SHOULD NOT DRIVE, use power tools, machinery or perform tasks that involve climbing or major physical exertion for 24 hours (because of the sedation medicines used during the test).  ° °SYMPTOMS TO REPORT IMMEDIATELY: °A gastroenterologist can be reached at any hour. Please call [phone]  for any of the following symptoms:  °Following lower endoscopy (colonoscopy, flexible sigmoidoscopy) °Excessive amounts of blood in the stool  °Significant tenderness, worsening of abdominal pains  °Swelling of the abdomen that is new, acute  °Fever of 100° or  higher  °Following upper endoscopy (EGD, EUS, ERCP, esophageal dilation) °Vomiting of blood or coffee ground material  °New, significant abdominal pain  °New, significant chest pain or pain under the shoulder blades  °Painful or persistently difficult swallowing  °New shortness of breath  °Black, tarry-looking or red, bloody stools ° °FOLLOW UP:  °If any biopsies were taken you will be contacted by phone or by letter within the next 1-3 weeks. Call [phone]  if you have not heard about the biopsies in 3 weeks.  °Please also call with any specific questions about appointments or follow up tests. ° °

## 2016-10-26 NOTE — Transfer of Care (Signed)
Immediate Anesthesia Transfer of Care Note  Patient: Darrell Cunningham  Procedure(s) Performed: Procedure(s): UPPER ENDOSCOPIC ULTRASOUND (EUS) LINEAR (N/A)  Patient Location: PACU  Anesthesia Type:MAC  Level of Consciousness:  sedated, patient cooperative and responds to stimulation  Airway & Oxygen Therapy:Patient Spontanous Breathing and Patient connected to face mask oxgen  Post-op Assessment:  Report given to PACU RN and Post -op Vital signs reviewed and stable  Post vital signs:  Reviewed and stable  Last Vitals:  Vitals:   10/26/16 0926  BP: 138/79  Resp: (!) 21  Temp: 22.0 C    Complications: No apparent anesthesia complications

## 2016-10-26 NOTE — Op Note (Signed)
[facility] Patient Name: Darrell Cunningham Procedure Date: 10/26/2016 MRN: [id number] Attending MD: [name] , MD Date of Birth: 04-27-1961 CSN: [id number] Age: 56 Admit Type: Outpatient Procedure:                Upper EUS Indications:              Esophageal mucosal mass/polyp found on endoscopy Providers:                [name], MD, Hilma Favors, RN, Alfonso Patten, Technician, Marla Roe, CRNA Referring MD:              Medicines:                Monitored Anesthesia Care Complications:            No immediate complications. Estimated blood loss:                            None. Estimated Blood Loss:     Estimated blood loss: none. Procedure:                Pre-Anesthesia Assessment:                           - Prior to the procedure, a History and Physical                            was performed, and patient medications and                            allergies were reviewed. The patient's tolerance of                            previous anesthesia was also reviewed. The risks                            and benefits of the procedure and the sedation                            options and risks were discussed with the patient.                            All questions were answered, and informed consent                            was obtained. Prior Anticoagulants: The patient has                            taken no previous anticoagulant or antiplatelet                            agents. ASA Grade Assessment: II - A patient with  mild systemic disease. After reviewing the risks                            and benefits, the patient was deemed in                            satisfactory condition to undergo the procedure.                           After obtaining informed consent, the endoscope was                            passed under direct vision. Throughout the   procedure, the patient's blood pressure, pulse, and                            oxygen saturations were monitored continuously. The                            [id number] ([id number]) scope was introduced through                            the mouth, and advanced to the body of the stomach.                            The [id number]) scope was introduced through                            the mouth, and advanced to the body of the stomach.                            The upper EUS was accomplished without difficulty.                            The patient tolerated the procedure well. Scope In: Scope Out: Findings:      Endoscopic Finding :      A medium-sized, fungating, very friable mass was found at the       gastroesophageal junction. The 6cm long mass was partially obstructing       and partially circumferential (involving two thirds of the lumen       circumference). The bulk of the mass lays in the distal esophagus but it       does stradle the GE junction with about 2cm of tumor below the Z line.       Biopsies were taken with a cold forceps for histology.      Endosonographic Finding :      1. The mass above correlated with a hypoechoic and heterogenous lesion       at the gastroesophageal junction. This ranges from 33 cm from the       incisors and extended to 39 cm. The lesion was partially circumferential       (involving two-thirds of the lumen circumference). The endosonographic       borders were poorly-defined. The mass measured up to 16 mm in thickness.       There was  sonographic evidence suggesting invasion into the adventitia       (Layer 5, UT3).      2. There was one 1.3cm round, hypoechoic, heterogeneous lymphnode       adjacent to the mass. This is presumed to be malignant based on EUS       criteria (uN1).      3. Limited views of pancreas, spleen, liver were all normal. 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                            Mucosal biopsies were performed. Moderate Sedation:      N/A- Per Anesthesia Care Recommendation:           - Discharge patient to home (ambulatory).                           - He will likely benefit from neoadjuvant chemo/XRT. Procedure Code(s):        --- Professional ---                           [phone], 15, Esophagogastroduodenoscopy, flexible,                            transoral; with endoscopic ultrasound examination                            limited to the esophagus, stomach or duodenum, and                            adjacent structures                           43239, 59,52, Esophagogastroduodenoscopy, flexible,                            transoral; with biopsy, single or multiple Diagnosis Code(s):        --- Professional ---                           C16.0, Malignant neoplasm of cardia                           K22.8, Other specified diseases of esophagus CPT copyright 2016 American Medical Association. All rights reserved. The codes documented in this report are preliminary and upon coder review may  be revised to meet current compliance requirements. [name], MD 10/26/2016 11:21:06 AM This report has been signed electronically. Number of Addenda: 0

## 2016-10-27 ENCOUNTER — Telehealth: Payer: Self-pay | Admitting: Hematology

## 2016-10-27 NOTE — Telephone Encounter (Signed)
Spoke with patient re appointments for 4/9 and 4/23. Also informed patient that lab appointments for 4/10 and 4/24 have been cancelled and he is only scheduled for chemo 4/10 and 4/24.

## 2016-10-27 NOTE — Anesthesia Postprocedure Evaluation (Addendum)
Anesthesia Post Note  Patient: Darrell Cunningham  Procedure(s) Performed: Procedure(s) (LRB): UPPER ENDOSCOPIC ULTRASOUND (EUS) LINEAR (N/A)  Patient location during evaluation: Endoscopy Anesthesia Type: MAC Level of consciousness: awake and alert Pain management: pain level controlled Vital Signs Assessment: post-procedure vital signs reviewed and stable Respiratory status: spontaneous breathing, nonlabored ventilation, respiratory function stable and patient connected to nasal cannula oxygen Cardiovascular status: stable and blood pressure returned to baseline Anesthetic complications: no       Last Vitals:  Vitals:   10/26/16 1130 10/26/16 1140  BP: 133/77 132/85  Pulse: 76 77  Resp: 20 (!) 22  Temp:      Last Pain:  Vitals:   10/26/16 1113  TempSrc: Oral  PainSc:                  Susan Bleich,JAMES TERRILL

## 2016-10-30 ENCOUNTER — Other Ambulatory Visit: Payer: Medicaid Other

## 2016-10-30 ENCOUNTER — Ambulatory Visit: Payer: Medicaid Other

## 2016-10-30 ENCOUNTER — Ambulatory Visit
Admission: RE | Admit: 2016-10-30 | Discharge: 2016-10-30 | Disposition: A | Discharge status: Home / Self Care | Payer: Medicaid Other | Source: Ambulatory Visit | Attending: Radiation Oncology | Admitting: Radiation Oncology

## 2016-10-30 ENCOUNTER — Encounter: Payer: Self-pay | Admitting: Radiation Oncology

## 2016-10-30 ENCOUNTER — Encounter: Payer: Self-pay | Admitting: *Deleted

## 2016-10-30 DIAGNOSIS — Z510 Encounter for antineoplastic radiation therapy: Secondary | ICD-10-CM | POA: Diagnosis not present

## 2016-10-30 NOTE — Progress Notes (Signed)
Financial Counseling--spoke with patient today--he is interested in applying for [employer] grant--will bring income verification to me tomorrow

## 2016-10-31 ENCOUNTER — Encounter: Payer: Self-pay | Admitting: Genetics

## 2016-10-31 ENCOUNTER — Telehealth: Payer: Self-pay | Admitting: *Deleted

## 2016-10-31 ENCOUNTER — Ambulatory Visit (HOSPITAL_BASED_OUTPATIENT_CLINIC_OR_DEPARTMENT_OTHER): Payer: Medicaid Other | Admitting: Nurse Practitioner

## 2016-10-31 ENCOUNTER — Ambulatory Visit (HOSPITAL_BASED_OUTPATIENT_CLINIC_OR_DEPARTMENT_OTHER): Payer: Medicaid Other | Admitting: Genetics

## 2016-10-31 ENCOUNTER — Ambulatory Visit (HOSPITAL_BASED_OUTPATIENT_CLINIC_OR_DEPARTMENT_OTHER): Payer: Medicaid Other

## 2016-10-31 ENCOUNTER — Other Ambulatory Visit: Payer: Medicaid Other

## 2016-10-31 ENCOUNTER — Other Ambulatory Visit: Payer: Self-pay | Admitting: Oncology

## 2016-10-31 ENCOUNTER — Ambulatory Visit
Admission: RE | Admit: 2016-10-31 | Discharge: 2016-10-31 | Disposition: A | Discharge status: Home / Self Care | Payer: Medicaid Other | Source: Ambulatory Visit | Attending: Radiation Oncology | Admitting: Radiation Oncology

## 2016-10-31 ENCOUNTER — Other Ambulatory Visit: Payer: Self-pay | Admitting: Hematology

## 2016-10-31 VITALS — BP 119/69 | HR 76 | Temp 98.5°F | Resp 20

## 2016-10-31 DIAGNOSIS — Z8041 Family history of malignant neoplasm of ovary: Secondary | ICD-10-CM

## 2016-10-31 DIAGNOSIS — C155 Malignant neoplasm of lower third of esophagus: Secondary | ICD-10-CM

## 2016-10-31 DIAGNOSIS — Z803 Family history of malignant neoplasm of breast: Secondary | ICD-10-CM

## 2016-10-31 DIAGNOSIS — T80818A Extravasation of other vesicant agent, initial encounter: Secondary | ICD-10-CM

## 2016-10-31 DIAGNOSIS — Z5111 Encounter for antineoplastic chemotherapy: Secondary | ICD-10-CM | POA: Diagnosis present

## 2016-10-31 DIAGNOSIS — Z510 Encounter for antineoplastic radiation therapy: Secondary | ICD-10-CM | POA: Diagnosis not present

## 2016-10-31 DIAGNOSIS — Z800 Family history of malignant neoplasm of digestive organs: Secondary | ICD-10-CM

## 2016-10-31 DIAGNOSIS — Z7183 Encounter for nonprocreative genetic counseling: Secondary | ICD-10-CM

## 2016-10-31 DIAGNOSIS — C153 Malignant neoplasm of upper third of esophagus: Secondary | ICD-10-CM

## 2016-10-31 DIAGNOSIS — T7840XA Allergy, unspecified, initial encounter: Secondary | ICD-10-CM

## 2016-10-31 HISTORY — DX: Encounter for nonprocreative genetic counseling: Z71.83

## 2016-10-31 LAB — COMPREHENSIVE METABOLIC PANEL
ALT: 19 U/L (ref 0–55)
AST: 15 U/L (ref 5–34)
Albumin: 3.5 g/dL (ref 3.5–5.0)
Alkaline Phosphatase: 98 U/L (ref 40–150)
Anion Gap: 11 mEq/L (ref 3–11)
BILIRUBIN TOTAL: 0.35 mg/dL (ref 0.20–1.20)
BUN: 11.8 mg/dL (ref 7.0–26.0)
CO2: 22 meq/L (ref 22–29)
Calcium: 9.7 mg/dL (ref 8.4–10.4)
Chloride: 105 mEq/L (ref 98–109)
Creatinine: 0.7 mg/dL (ref 0.7–1.3)
GLUCOSE: 117 mg/dL (ref 70–140)
Potassium: 3.9 mEq/L (ref 3.5–5.1)
SODIUM: 138 meq/L (ref 136–145)
TOTAL PROTEIN: 7.7 g/dL (ref 6.4–8.3)

## 2016-10-31 MED ORDER — METHYLPREDNISOLONE SODIUM SUCC 125 MG IJ SOLR
125.0000 mg | Freq: Once | INTRAMUSCULAR | Status: AC | PRN
  Administered 2016-10-31: 125 mg via INTRAVENOUS

## 2016-10-31 MED ORDER — PALONOSETRON HCL INJECTION 0.25 MG/5ML
INTRAVENOUS | Status: AC
  Filled 2016-10-31: qty 5

## 2016-10-31 MED ORDER — SODIUM CHLORIDE 0.9 % IV SOLN
20.0000 mg | Freq: Once | INTRAVENOUS | Status: AC
  Administered 2016-10-31: 20 mg via INTRAVENOUS
  Filled 2016-10-31: qty 2

## 2016-10-31 MED ORDER — SODIUM CHLORIDE 0.9 % IV SOLN
242.2000 mg | Freq: Once | INTRAVENOUS | Status: AC
  Administered 2016-10-31: 240 mg via INTRAVENOUS
  Filled 2016-10-31: qty 24

## 2016-10-31 MED ORDER — FAMOTIDINE IN NACL 20-0.9 MG/50ML-% IV SOLN
INTRAVENOUS | Status: AC
  Filled 2016-10-31: qty 50

## 2016-10-31 MED ORDER — SODIUM CHLORIDE 0.9 % IV SOLN
Freq: Once | INTRAVENOUS | Status: AC
  Administered 2016-10-31: 14:00:00 via INTRAVENOUS

## 2016-10-31 MED ORDER — PALONOSETRON HCL INJECTION 0.25 MG/5ML
0.2500 mg | Freq: Once | INTRAVENOUS | Status: AC
  Administered 2016-10-31: 0.25 mg via INTRAVENOUS

## 2016-10-31 MED ORDER — FAMOTIDINE IN NACL 20-0.9 MG/50ML-% IV SOLN
20.0000 mg | Freq: Once | INTRAVENOUS | Status: AC
  Administered 2016-10-31: 20 mg via INTRAVENOUS

## 2016-10-31 MED ORDER — DIPHENHYDRAMINE HCL 50 MG/ML IJ SOLN
INTRAMUSCULAR | Status: AC
  Filled 2016-10-31: qty 1

## 2016-10-31 MED ORDER — DIPHENHYDRAMINE HCL 50 MG/ML IJ SOLN
50.0000 mg | Freq: Once | INTRAMUSCULAR | Status: AC
  Administered 2016-10-31: 50 mg via INTRAVENOUS

## 2016-10-31 MED ORDER — PACLITAXEL CHEMO INJECTION 300 MG/50ML
50.0000 mg/m2 | Freq: Once | INTRAVENOUS | Status: AC
  Administered 2016-10-31: 84 mg via INTRAVENOUS
  Filled 2016-10-31: qty 14

## 2016-10-31 NOTE — Patient Instructions (Signed)
[facility] Discharge Instructions for Patients Receiving Chemotherapy  Today you received the following chemotherapy agents: Taxol and Carboplatin.  To help prevent nausea and vomiting after your treatment, we encourage you to take your nausea medication: Compazine. Take one every 6 hours as needed. For nausea on or after 11/03/16, you may take Zofran every 12 hours as needed.  If you develop nausea and vomiting that is not controlled by your nausea medication, call the clinic.   BELOW ARE SYMPTOMS THAT SHOULD BE REPORTED IMMEDIATELY:  *FEVER GREATER THAN 100.5 F  *CHILLS WITH OR WITHOUT FEVER  NAUSEA AND VOMITING THAT IS NOT CONTROLLED WITH YOUR NAUSEA MEDICATION  *UNUSUAL SHORTNESS OF BREATH  *UNUSUAL BRUISING OR BLEEDING  TENDERNESS IN MOUTH AND THROAT WITH OR WITHOUT PRESENCE OF ULCERS  *URINARY PROBLEMS  *BOWEL PROBLEMS  UNUSUAL RASH Items with * indicate a potential emergency and should be followed up as soon as possible.  Feel free to call the clinic should you have any questions or concerns. The clinic phone number is [phone].  Please show the [place] at check-in to the Emergency Department and triage nurse.

## 2016-10-31 NOTE — Telephone Encounter (Signed)
error 

## 2016-10-31 NOTE — Progress Notes (Signed)
REFERRING PROVIDER: Kyung Rudd, MD [place] [place], [place] [address]  PRIMARY PROVIDER:  Odette Fraction, MD  PRIMARY REASON FOR VISIT:  1. Family history of breast cancer   2. Family history of ovarian cancer   3. Family history of colon cancer   4. Malignant neoplasm of lower third of esophagus (HCC)     HISTORY OF PRESENT ILLNESS:   Darrell Cunningham, a 56 y.o. male, was seen for a [facility] cancer genetics consultation at the request of Dr. Lisbeth Cunningham due to a personal and family history of cancer.  Darrell Cunningham presents to clinic today to discuss the possibility of a hereditary predisposition to cancer, genetic testing, and to further clarify his future cancer risks, as well as potential cancer risks for family members.   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 His treatment is pending, but he plans radiation and chemotherapy.   CANCER HISTORY:    Esophageal cancer ([place])   09/21/2016 - 09/21/2016 Hospital Admission    esophageal pain and vomiting up blood      10/09/2016 Initial Diagnosis    Esophageal cancer ([place])      10/09/2016 Procedure    EGD 1. Partially obstructing, likely malignant esophageal tumor was found in the lower third of the esophagus. Multiple biopsies.  2. Mass visible during gastric retroflexion at GE junction 3. Otherwise normal stomach 4. Normal examined duodenum       10/09/2016 Pathology Results    Esophagus, biopsy, distal esophageal tumor (33-39) - SUSPICIOUS FOR ADENOCARCINOMA      10/12/2016 Imaging    CT CAP w Contrast IMPRESSION: Distal esophageal mass compatible with primary esophageal malignancy. There are 2 adjacent abnormal appearing subcentimeter paraesophageal lymph nodes which may represent nodal metastasis. Additionally there is a prominent nonspecific 8 mm upper abdominal lymph node.  No evidence for distant metastatic disease in the chest, abdomen  or pelvis.      10/24/2016 PET scan    1. Markedly hypermetabolic distal esophageal lesion, compatible with malignancy. Adjacent small paraesophageal lymph nodes are abnormal by CT but cannot be resolved as separate structures from the hypermetabolic esophageal activity on the PET images. No hypermetabolism is demonstrated in the upper abdominal/gastrohepatic ligament lymph node although the small size of this lymph node may be below threshold for detection on PET imaging. 2. No evidence for distant hypermetabolic metastatic disease in the neck, chest, abdomen, or pelvis.        HORMONAL RISK FACTORS:  Colonoscopy: yes; abnormal. Last colonoscopy 10/19/2014. Revealed 1 tubular adenoma and 1 hyperplastic polyp. Any excessive radiation exposure in the past:  no  Past Medical History:  Diagnosis Date  . Allergy   . Arthritis   . Asthma    as a child  . 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 Dysphagia   . Hyperlipidemia   . Neuromuscular disorder ([place])   . Pneumonia 4 yrs ago    Past Surgical History:  Procedure Laterality Date  . EUS N/A 10/26/2016   Procedure: UPPER ENDOSCOPIC ULTRASOUND (EUS) LINEAR;  Surgeon: Darrell Banister, MD;  Location: [facility] ENDOSCOPY;  Service: Endoscopy;  Laterality: N/A;  . EYE SURGERY Bilateral age 49    for cross eyes  . MOUTH SURGERY      Social History   Social History  . Marital status: Legally Separated    Spouse name: N/A  . Number of children: N/A  .  Years of education: N/A   Social History Main Topics  . Smoking status: Former Smoker    Packs/day: 1.00    Years: 38.00    Types: Cigarettes    Start date: 10/31/2012    Quit date: 10/31/2012  . Smokeless tobacco: Never Used  . Alcohol use No  . Drug use: No  . Sexual activity: Not on file   Other Topics Concern  . Not on file   Social History Narrative  . No narrative on file     FAMILY HISTORY:  We obtained a detailed, 4-generation family history.  Significant  diagnoses are listed below: Family History  Problem Relation Age of Onset  . Colon cancer Maternal Grandmother 33  . Breast cancer Maternal Grandmother 70  . Breast cancer Sister 54    Deceased at 78 of breast cancer  . Ovarian cancer Maternal Aunt   . Stomach cancer Neg Hx   . Esophageal cancer Neg Hx    Darrell Cunningham has 2 daughters (ages 54 and 67) and one son (age 16). All of his children are living without cancer or tumor histories. Darrell Cunningham has 2 full-sisters and one maternal half-sister. His full-sister, Darrell Cunningham, died at age 43 from breast cancer diagnosed at 30. His other full-sister, Darrell Cunningham, is 44 without cancers. His maternal half-sister, Darrell Cunningham, is around age 27 without cancers.  Darrell Cunningham mother is 12 without cancers. She has one brother who is living and multiple sisters. One sister died of ovarian cancer. Darrell Cunningham maternal grandmother died of colon and breast cancer in her 25s.  Darrell Cunningham father died at age 66 with COPD. Darrell Cunningham reported that his father also had some type of liver surgery, but he does not know if it was related to cancer.  There is limited information regarding the remainder of Darrell Cunningham's family.  Darrell Cunningham is unaware of previous family history of genetic testing for hereditary cancer risks. Patient's maternal ancestors are of Caucasian descent, and paternal ancestors are of Caucasian descent. There I no reported Ashkenazi Jewish ancestry. There is no known consanguinity.  GENETIC COUNSELING ASSESSMENT: Darrell Cunningham is a 56 y.o. male with a personal and family history which is suggestive of a hereditary cancer syndrome and predisposition to cancer. We, therefore, discussed and recommended the following at today's visit.   DISCUSSION: We reviewed the characteristics, features and inheritance patterns of hereditary cancer syndromes. We also discussed genetic testing, including the appropriate family members to test, the  process of testing, insurance coverage and turn-around-time for results. We discussed the implications of a negative, positive and/or variant of uncertain significant result. We recommended Mr. Garbett pursue genetic testing for the 46-gene Common Hereditary Cancers Panel offered by [facility].   Based on Mr. Pribble's personal and family history of cancer, he meets medical criteria for genetic testing. Based on [employer] and Mr. Mah's [place] Medicaid, he is expected to have access to genetic testing at no personal expense.    PLAN: Despite our recommendation, Mr. Kataoka did not wish to pursue genetic testing at today's visit. He stated that though he is interested in testing at a later time, he would like to get through his cancer treatments and surgery before pursuing genetic testing. We understand this decision, and remain available to coordinate genetic testing at any time in the future. We, therefore, recommend Mr. Holloran continue to follow the cancer screening guidelines given by his primary healthcare provider. We also informed Mr. Mcquaid that if he does  not pursue genetic testing, his family members (mother, sisters, children) would still qualify and may pursue testing for themselves. He was encouraged to share this information with them.  Lastly, we encouraged Mr. Spagnoli to remain in contact with cancer genetics annually so that we can continuously update the family history and inform him of any changes in cancer genetics and testing that may be of benefit for this family.   Mr.  Dilauro questions were answered to his satisfaction today. Our contact information was provided should additional questions or concerns arise. Thank you for the referral and allowing [place] to share in the care of your patient.   Mal Misty, MS, [facility] [facility] Certified Naval [email or web address] phone: [phone]  The patient was seen for a total of 30 minutes in face-to-face  genetic counseling.  This patient was discussed with Drs. Magrinat, Lindi Adie and/or Burr Medico who agrees with the above.    _______________________________________________________________________ For Office Staff:  Number of people involved in session: 1 Was an Intern/ student involved with case: no

## 2016-10-31 NOTE — Progress Notes (Signed)
1453: Pt reported back pain that extended around to mid abdomen. Taxol infusion stopped. Normal saline started wide open. Pt then reported throat pain and feeling of dyspnea. SpO2 99%. Selena Lesser, NP notified- in to assess patient.

## 2016-10-31 NOTE — Progress Notes (Addendum)
Darrell Cunningham  [phone] Fax:(336) [phone]  Clinic Follow-up Note   Patient Care Team: [name], MD as PCP - General (Family Medicine) 11/06/2016   CHIEF COMPLAINTS/PURPOSE OF CONSULTATION:  Esophageal adenocarcinoma   Oncology History   Cancer Staging Esophageal cancer [facility]) Staging form: Esophagus - Adenocarcinoma, AJCC 8th Edition - Clinical stage from 10/26/2016: Stage III (cT3, cN1, cM0) - Signed by [name], MD on 11/05/2016       Esophageal cancer (Darrell Cunningham)   09/21/2016 - 09/21/2016 Hospital Admission    esophageal pain and vomiting up blood      10/09/2016 Initial Diagnosis    Esophageal cancer (Darrell Cunningham)      10/09/2016 Procedure    EGD 1. Partially obstructing, likely malignant esophageal tumor was found in the lower third of the esophagus. Multiple biopsies.  2. Mass visible during gastric retroflexion at GE junction 3. Otherwise normal stomach 4. Normal examined duodenum       10/09/2016 Pathology Results    Esophagus, biopsy, distal esophageal tumor (33-39) - SUSPICIOUS FOR ADENOCARCINOMA      10/12/2016 Imaging    CT CAP w Contrast IMPRESSION: Distal esophageal mass compatible with primary esophageal malignancy. There are 2 adjacent abnormal appearing subcentimeter paraesophageal lymph nodes which may represent nodal metastasis. Additionally there is a prominent nonspecific 8 mm upper abdominal lymph node.  No evidence for distant metastatic disease in the chest, abdomen or pelvis.      10/24/2016 PET scan    1. Markedly hypermetabolic distal esophageal lesion, compatible with malignancy. Adjacent small paraesophageal lymph nodes are abnormal by CT but cannot be resolved as separate structures from the hypermetabolic esophageal activity on the PET images. No hypermetabolism is demonstrated in the upper abdominal/gastrohepatic ligament lymph node although the small size of this lymph node may be below threshold for  detection on PET imaging. 2. No evidence for distant hypermetabolic metastatic disease in the neck, chest, abdomen, or pelvis.      10/26/2016 Pathology Results    Esophagogastric junction, biopsy, mass - INVASIVE ADENOCARCINOMA.      10/26/2016 Procedure    EUS showed uT3N1 disease, and biopsy confirmed adenocarcinoma       10/30/2016 -  Radiation Therapy    Neoadjuvant radiation to esophageal cancer        10/31/2016 -  Neo-Adjuvant Chemotherapy    Weekly Carboplatin AUC 2 and taxol 50mg /m2 with concurrent radiation        HISTORY OF PRESENTING ILLNESS (10/16/2016):  Darrell Cunningham 56 y.o. male is here because of an esophageal mass that is suspicious for adenocarcinoma. The pt presented to the ED on 09/21/2016 for esophageal pain and vomiting up blood. He had been struggling with regurgitating food or drinks, difficulty swallowing, and throat pain for 3 weeks prior. He was referred by his PCP to GI who ordered him an EGD with dilation with Dr. [name] as soon as possible. The EGD and biopsy was performed on 10/09/2016, which showed suspicion of adenocarcinoma of the esophagus and a CT scan was ordered. The CT results demonstrated a distal esophageal mass compatible with primary esophageal malignancy. He is here to discuss treatment.   When he has trouble swallowing, he has to stand up and push his head back to get the food down. He has mostly eating oatmeal, Ensure, and other soft foods. He drinks 2-3 Ensure a day. He has cut out coffee and acidic foods to try to relieve his pain. It is not getting better, though.  He isn't able to eat much due to this pain and difficulty swallowing. He does not take any pain medication. He has lost about 13 pounds. He also has some left sided abdominal pain. At night, he has a lot of acid reflux and sputum production. Denies constipation, diarrhea, nausea, SOB, or any other concerns.   He has a history of cerebral palsy on his right side. He has some back  pain due to a curvature in his spine. History of asthma. His grandmother had breast cancer and colon cancer. His sister passed of breast cancer at age 51. His aunt had ovarian cancer. No other family history of cancer. He quit smoking 4 years ago. 38 pack years, 1-1.5 ppd. He lives at home with his 24 y.o. Daughter. His parents live about 15 minutes away.   CURRENT THERAPY: Concurrent chemo and radiation, with weekly Carboplatin AUC 2 and taxol 50mg /m2   INTERVAL HISTORY:  The patient returns for follow up. He is doing well today. He had infusion reaction during his first dose of Taxol last week, with skin itchiness and throat tightness. It resolved after hypersensitive reaction medications. He also developed kidney chemotherapy infiltrates in his right arm, it was red and swollen from the chemo being administered. It still itches. His port is being put in on Wednesday. He can not eat solid foods due to the throat pain, but has been drinking Ensure and Boost when he can. He can eat soft foods like soup and ice cream. Pt lost some weight. He has a hard time sleeping at night because of the pain in his abdomen. He takes two of his pain pills a day, but not before eating. Pt has some constipation. Denies syncope, or any other concerns.   MEDICAL HISTORY:  Past Medical History:  Diagnosis Date  . Allergy   . Arthritis   . Asthma    as a child  . 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 Dysphagia   . Encounter for nonprocreative genetic counseling 10/31/2016   Mr. Revere underwent genetic counseling for hereditary cancer syndromes on 10/31/2016. Though he is a candidate for genetic testing, he declines at this time.  . Hyperlipidemia   . Neuromuscular disorder ([place])   . Pneumonia 4 yrs ago    SURGICAL HISTORY: Past Surgical History:  Procedure Laterality Date  . EUS N/A 10/26/2016   Procedure: UPPER ENDOSCOPIC ULTRASOUND (EUS) LINEAR;  Surgeon: [name], MD;  Location: [facility]  ENDOSCOPY;  Service: Endoscopy;  Laterality: N/A;  . EYE SURGERY Bilateral age 1    for cross eyes  . MOUTH SURGERY      SOCIAL HISTORY: Social History   Social History  . Marital status: Legally Separated    Spouse name: N/A  . Number of children: N/A  . Years of education: N/A   Occupational History  . Not on file.   Social History Main Topics  . Smoking status: Former Smoker    Packs/day: 1.00    Years: 38.00    Types: Cigarettes    Start date: 10/31/2012    Quit date: 10/31/2012  . Smokeless tobacco: Never Used  . Alcohol use No  . Drug use: No  . Sexual activity: Not on file   Other Topics Concern  . Not on file   Social History Narrative  . No narrative on file    FAMILY HISTORY: Family History  Problem Relation Age of Onset  . Colon cancer Maternal Grandmother 32  .  Breast cancer Maternal Grandmother 68  . Breast cancer Sister 27    Deceased at 72 of breast cancer  . Ovarian cancer Maternal Aunt   . Stomach cancer Neg Hx   . Esophageal cancer Neg Hx     ALLERGIES:  is allergic to penicillins.  MEDICATIONS:  Current Outpatient Prescriptions  Medication Sig Dispense Refill  . emollient (BIAFINE) cream Apply 1 application topically daily.    . fluticasone (FLONASE) 50 MCG/ACT nasal spray Place 1 spray into both nostrils daily as needed for allergies or rhinitis.    Marland Kitchen ibuprofen (ADVIL,MOTRIN) 200 MG tablet Take 800 mg by mouth daily as needed for headache or moderate pain.     Marland Kitchen morphine (ROXANOL) 20 MG/ML concentrated solution Take 0.25-0.5 mLs (5-10 mg total) by mouth every 6 (six) hours as needed for severe pain. 30 mL 0  . ondansetron (ZOFRAN) 8 MG tablet Take 1 tablet (8 mg total) by mouth 2 (two) times daily as needed for refractory nausea / vomiting. Start on day 3 after chemo. 30 tablet 1  . pantoprazole (PROTONIX) 40 MG tablet Take 1 tablet (40 mg total) by mouth 2 (two) times daily. 60 tablet 3  . PROAIR HFA 108 (90 Base) MCG/ACT inhaler INHALE 2  PUFFS INTO THE LUNGS EVERY 6 (SIX) HOURS AS NEEDED FOR WHEEZING OR SHORTNESS OF BREATH. 18 g 0  . prochlorperazine (COMPAZINE) 10 MG tablet Take 1 tablet (10 mg total) by mouth every 6 (six) hours as needed (Nausea or vomiting). 30 tablet 1  . sucralfate (CARAFATE) 1 GM/10ML suspension Take 10 mLs (1 g total) by mouth 4 (four) times daily -  with meals and at bedtime. 420 mL 2  . tiZANidine (ZANAFLEX) 4 MG tablet TAKE 1 TABLET (4 MG TOTAL) BY MOUTH EVERY 6 (SIX) HOURS AS NEEDED FOR MUSCLE SPASMS. 30 tablet 0   No current facility-administered medications for this visit.    REVIEW OF SYSTEMS:   Constitutional: Denies fevers, chills or abnormal night sweats (+) weight loss Eyes: Denies blurriness of vision, double vision or watery eyes Ears, nose, mouth, throat, and face: Denies mucositis or sore throat (+) throat pain (+) trouble swallowing solids Respiratory: Denies cough, dyspnea or wheezes  Cardiovascular: Denies palpitation, chest discomfort or lower extremity swelling Gastrointestinal:  Denies nausea, heartburn or change in bowel habits (+) abdominal pain (+) constipation  Skin: Denies abnormal skin rashes (+) right arm red and swollen (+) itching  Lymphatics: Denies new lymphadenopathy or easy bruising Neurological:Denies numbness, tingling or new weaknesses  Musculoskeletal: Negative Behavioral/Psych: Mood is stable, no new changes (+) insomnia  All other systems were reviewed with the patient and are negative.  PHYSICAL EXAMINATION: ECOG PERFORMANCE STATUS: 1 - Symptomatic but completely ambulatory  Vitals:   11/06/16 1552  BP: 133/85  Pulse: (!) 101  Resp: 16  Temp: 98.7 F (37.1 C)   Filed Weights   11/06/16 1552  Weight: 141 lb (64 kg)   GENERAL:alert, no distress and comfortable (+) limp from right sided cerebral palsy (+) weight loss of about 2 lbs since 10/19/16 SKIN: skin color, texture, turgor are normal, no rashes or significant lesions EYES: normal, conjunctiva  are pink and non-injected, sclera clear OROPHARYNX:no exudate, no erythema and lips, buccal mucosa, and tongue normal  NECK: supple, thyroid normal size, non-tender, without nodularity LYMPH:  no palpable lymphadenopathy in the cervical, axillary or inguinal LUNGS: clear to auscultation and percussion with normal breathing effort (+) wheezing right lung HEART: regular rate & rhythm  and no murmurs and no lower extremity edema ABDOMEN:abdomen soft, non-tender and normal bowel sounds (+) tenderness  Musculoskeletal:no cyanosis of digits and no clubbing  PSYCH: alert & oriented x 3 with fluent speech NEURO: no focal motor/sensory deficits  LABORATORY DATA:  I have reviewed the data as listed CBC Latest Ref Rng & Units 11/06/2016 09/21/2016 09/21/2016  WBC 4.0 - 10.3 10e3/uL 7.5 9.1 6.6  Hemoglobin 13.0 - 17.1 g/dL 13.3 13.2 13.4  Hematocrit 38.4 - 49.9 % 39.2 39.6 41.2  Platelets 140 - 400 10e3/uL 201 210 215   CMP Latest Ref Rng & Units 11/06/2016 10/31/2016 09/21/2016  Glucose 70 - 140 mg/dl 111 117 104(H)  BUN 7.0 - 26.0 mg/dL 15.3 11.8 17  Creatinine 0.7 - 1.3 mg/dL 0.7 0.7 0.73  Sodium 136 - 145 mEq/L 134(L) 138 137  Potassium 3.5 - 5.1 mEq/L 4.2 3.9 3.7  Chloride 101 - 111 mmol/L - - 103  CO2 22 - 29 mEq/L 24 22 26   Calcium 8.4 - 10.4 mg/dL 9.6 9.7 9.2  Total Protein 6.4 - 8.3 g/dL 7.7 7.7 7.5  Total Bilirubin 0.20 - 1.20 mg/dL 0.41 0.35 0.4  Alkaline Phos 40 - 150 U/L 91 98 80  AST 5 - 34 U/L 15 15 19   ALT 0 - 55 U/L 29 19 28    PATHOLOGY:  Diagnosis 10/26/2016 Esophagogastric junction, biopsy, mass - INVASIVE ADENOCARCINOMA.  Diagnosis 10/09/2016 Esophagus, biopsy, distal esophageal tumor (33-39) - SUSPICIOUS FOR ADENOCARCINOMA, SEE COMMENT. Microscopic Comment The majority of fragments are reactive squamous mucosa with associated acute inflammation. There is a small fragment of atypical glandular epithelium with associated ulceration that is suspicious for adenocarcinoma. There  is no background Barrett's esophagus in the glandular component. Dr. Saralyn Pilar has reviewed the case.  RADIOGRAPHIC STUDIES: I have personally reviewed the radiological images as listed and agreed with the findings in the report.  PET 10/24/2016 IMPRESSION: 1. Markedly hypermetabolic distal esophageal lesion, compatible with malignancy. Adjacent small paraesophageal lymph nodes are abnormal by CT but cannot be resolved as separate structures from the hypermetabolic esophageal activity on the PET images. No hypermetabolism is demonstrated in the upper abdominal/gastrohepatic ligament lymph node although the small size of this lymph node may be below threshold for detection on PET imaging. 2. No evidence for distant hypermetabolic metastatic disease in the neck, chest, abdomen, or pelvis.  CT CAP w Contrast 10/12/2016 IMPRESSION: Distal esophageal mass compatible with primary esophageal malignancy. There are 2 adjacent abnormal appearing subcentimeter paraesophageal lymph nodes which may represent nodal metastasis. Additionally there is a prominent nonspecific 8 mm upper abdominal lymph node.  No evidence for distant metastatic disease in the chest, abdomen or pelvis.  EGD 10/09/2016   ASSESSMENT & PLAN:  Andersen is a 56 y.o. Caucasian male with PMH cerebral palsy and heavy smoking, who presents with dysphagia, odynophagia and weight loss   1. Low esophageal Adenocarcinoma, cT3N1M0, stage III -I previously reviewed the imaging and biopsy pathology results with the patient in detail. Although his biopsy pathology was not definitive for malignancy, based on his endoscopy findings, this is most consistent with esophageal adenocarcinoma. -I previously reviewed his PET scan images with patient in person, which showed intense hypermetabolic esophageal cancer, no other distant metastasis -I reviewed the EUS and re-biopsy results, which showed T3N1 disease, and biopsy confirmed  adenocarcinoma -We previously reviewed the standard care for locally advanced esophageal cancer, including neoadjuvant chemoradiation, followed by esophagectomy.  -He was previously referred to see thoracic surgeon Dr. Pia Mau. -He  has started concurrent chemoradiation with weekly carbo and Taxol -The goal of therapy is curative -He developed inpatient ration to Taxol are first week, I'll add steroids to his premedication today, and changed to Abraxane from next week to avoid further reaction  -his second dose chemo is scheduled for tomorrow, lab reviewed, adequate for treatment  -I encouraged him to drink plenty of water, eat soft foods, and eat slowly. Follow up with dietitian  -Continue using zofran and compazine   2. Dysphagia, odynophagia and weight loss -Difficulty eating due to esophageal pain -He can eat soft foods, which occasionally get stuck -Drinks 3 Ensure a day. I encouraged 4-5 a day.  -He will meet with our dietician  -We again discussed that he may need a feeding tube for nutrition supplement, if his dysphagia gets worse -I have refilled liquid morphine for esophageal pain  -I have encouraged him to take more than 2 pain pills a day, especially before eating.   3. History of heavy smoking -He has quit smoking completely,history of 40 pack year.   4. Constipation -I encouraged the use of MiraLax daily or more if needed  -This may get worse due to morphine use   Plan.  -Port put in on Wednesday -Treatment tomorrow. Lab is adequate for treatment. Switch from Taxol to Abraxane, approved by insurance  -Refill morphine  -port placement by IR on 4/11 -Return for f/u in 2 weeks  -weekly labs   All questions were answered. The patient knows to call the clinic with any problems, questions or concerns.  I spent 25 minutes counseling the patient face to face. The total time spent in the appointment was 30 minutes and more than 50% was on counseling.  This document serves  as a record of services personally performed by [name], MD. It was created on her behalf by Martinique Casey, a trained medical scribe. The creation of this record is based on the scribe's personal observations and the provider's statements to them. This document has been checked and approved by the attending provider.  I have reviewed the above documentation for accuracy and completeness and I agree with the above.   [name], MD 11/06/2016

## 2016-10-31 NOTE — Progress Notes (Signed)
RN noticed edema to right FA at PIV insertion site at change of chemo drug prior to starting carboplatin.  Michel Harrow, NP notified.  1 cc saline/taxol able to be aspirated.  Ice applied to site.  New PIV started for carboplatin.  Darrell Cunningham instructed to return at 24 hr, 48hr, and 1 week.  Darrell Cunningham verbalized understanding.  Instruction sheet given to Darrell Cunningham.

## 2016-11-01 ENCOUNTER — Ambulatory Visit
Admission: RE | Admit: 2016-11-01 | Discharge: 2016-11-01 | Disposition: A | Discharge status: Home / Self Care | Payer: Medicaid Other | Source: Ambulatory Visit | Attending: Radiation Oncology | Admitting: Radiation Oncology

## 2016-11-01 ENCOUNTER — Other Ambulatory Visit: Payer: Medicaid Other

## 2016-11-01 ENCOUNTER — Ambulatory Visit: Payer: Medicaid Other | Admitting: Nutrition

## 2016-11-01 ENCOUNTER — Encounter: Payer: Self-pay | Admitting: Nurse Practitioner

## 2016-11-01 ENCOUNTER — Ambulatory Visit: Payer: Medicaid Other | Admitting: Hematology

## 2016-11-01 ENCOUNTER — Other Ambulatory Visit: Payer: Self-pay | Admitting: *Deleted

## 2016-11-01 ENCOUNTER — Ambulatory Visit: Payer: Medicaid Other

## 2016-11-01 DIAGNOSIS — C159 Malignant neoplasm of esophagus, unspecified: Secondary | ICD-10-CM

## 2016-11-01 DIAGNOSIS — T80818A Extravasation of other vesicant agent, initial encounter: Secondary | ICD-10-CM | POA: Insufficient documentation

## 2016-11-01 DIAGNOSIS — Z510 Encounter for antineoplastic radiation therapy: Secondary | ICD-10-CM | POA: Diagnosis not present

## 2016-11-01 DIAGNOSIS — T7840XA Allergy, unspecified, initial encounter: Secondary | ICD-10-CM | POA: Insufficient documentation

## 2016-11-01 MED ORDER — BIAFINE EX EMUL
Freq: Every day | CUTANEOUS | Status: DC
  Administered 2016-11-01: 12:00:00 via TOPICAL

## 2016-11-01 NOTE — Assessment & Plan Note (Signed)
Patient had almost completed the Taxol portion of his carboplatin/Taxol chemotherapy regimen today; when it was noted that patient had a probable extravasation to the right forearm peripherally inserted IV infusion.  Patient denied any tenderness or pain to the area.  However, on exam.  It does appear the patient has some edema, most likely from extravasation.  Explained with the patient and his wife that Taxol is considered an irritant.  Frequent compress was placed medially to the site.  After attempting to aspirate any fluid from the extravasation site per the [place].  Patient was advised to elevate his arm above the level.  Whenever he is at rest and to keep a compress on it multiple times throughout the day.  He should watch/monitor the site carefully.  He will return for the next 2 days for a brief evaluation of the site as well.  Patient was encouraged to call/return or go directly to the emergency department for any worsening symptoms whatsoever.  All extravasation protocol paperwork was filled out as well.

## 2016-11-01 NOTE — Assessment & Plan Note (Addendum)
Patient presented to the [facility] today.  Receive his first cycle of Taxol/carboplatin chemotherapy regimen.  Patient also continues to receive daily radiation treatment. See further notes for details of today's visit.  Patient is scheduled to return on 11/06/2016 for labs and a follow-up visit.

## 2016-11-01 NOTE — Progress Notes (Signed)
56 year old male diagnosed with esophageal cancer.  He is a patient of Dr. Burr Medico. He will receive concurrent chemoradiation therapy.  Past medical history includes pneumonia, hyperlipidemia, dysphasia, COPD, and asthma.  Medications include Zofran, Protonix, Compazine, and Carafate.  Labs were reviewed.  Height: 5 feet 1 inch. Weight: 150.2 pounds April 4 improved from 145 lb March 28. Usual body weight: 148 pounds February 2018. BMI: 28.38.  Patient received first chemotherapy yesterday. He denies nutrition impact symptoms. Reports some difficulty chewing and swallowing but is able to tolerate soft foods. Reports adding oral nutrition supplements, 2-3 shakes daily, about 1 week ago.  Nutrition diagnosis:  Food and nutrition related knowledge deficit related to esophageal cancer and associated treatments as evidenced by no prior need for nutrition related information.  Intervention: Patient was educated to consume high-calorie high-protein foods in small frequent meals and snacks. Recommended patient continue oral nutrition supplements 3 times a day between meals. Provided samples of oral nutrition supplements and coupons. Educated patient to increase fluid. Provided education about bowel regimen. Questions were answered.  Teach back method used.  Contact information given.  Monitoring, evaluation, goals:  Patient will tolerate adequate calories and protein to maintain current weight.  Next visit: Tuesday, April 10, during infusion.  **Disclaimer: This note was dictated with voice recognition software. Similar sounding words can inadvertently be transcribed and this note may contain transcription errors which may not have been corrected upon publication of note.**

## 2016-11-01 NOTE — Progress Notes (Signed)
SYMPTOM MANAGEMENT CLINIC    Chief Complaint: Hypersensitivity reaction, extravasation  HPI:  Darrell Cunningham 56 y.o. male diagnosed with esophageal cancer.  Currently undergoing Taxol/carboplatin chemotherapy regimen and radiation treatment.     Esophageal cancer (Darrell Cunningham)   09/21/2016 - 09/21/2016 Hospital Admission    esophageal pain and vomiting up blood      10/09/2016 Initial Diagnosis    Esophageal cancer (Darrell Cunningham)      10/09/2016 Procedure    EGD 1. Partially obstructing, likely malignant esophageal tumor was found in the lower third of the esophagus. Multiple biopsies.  2. Mass visible during gastric retroflexion at GE junction 3. Otherwise normal stomach 4. Normal examined duodenum       10/09/2016 Pathology Results    Esophagus, biopsy, distal esophageal tumor ([id number]) - SUSPICIOUS FOR ADENOCARCINOMA      10/12/2016 Imaging    CT CAP w Contrast IMPRESSION: Distal esophageal mass compatible with primary esophageal malignancy. There are 2 adjacent abnormal appearing subcentimeter paraesophageal lymph nodes which may represent nodal metastasis. Additionally there is a prominent nonspecific 8 mm upper abdominal lymph node.  No evidence for distant metastatic disease in the chest, abdomen or pelvis.      10/24/2016 PET scan    1. Markedly hypermetabolic distal esophageal lesion, compatible with malignancy. Adjacent small paraesophageal lymph nodes are abnormal by CT but cannot be resolved as separate structures from the hypermetabolic esophageal activity on the PET images. No hypermetabolism is demonstrated in the upper abdominal/gastrohepatic ligament lymph node although the small size of this lymph node may be below threshold for detection on PET imaging. 2. No evidence for distant hypermetabolic metastatic disease in the neck, chest, abdomen, or pelvis.      10/26/2016 Pathology Results    Esophagogastric junction, biopsy, mass - INVASIVE ADENOCARCINOMA.       Review of Systems  Constitutional: Positive for malaise/fatigue and weight loss.  Skin:       Right forearm extravasation with edema.  All other systems reviewed and are negative.   Past Medical History:  Diagnosis Date  . Allergy   . Arthritis   . Asthma    as a child  . 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 Dysphagia   . Encounter for nonprocreative genetic counseling 10/31/2016   Darrell Cunningham underwent genetic counseling for hereditary cancer syndromes on 10/31/2016. Though he is a candidate for genetic testing, he declines at this time.  . Hyperlipidemia   . Neuromuscular disorder ([name])   . Pneumonia 4 yrs ago    Past Surgical History:  Procedure Laterality Date  . EUS N/A 10/26/2016   Procedure: UPPER ENDOSCOPIC ULTRASOUND (EUS) LINEAR;  Surgeon: [name], MD;  Location: [facility];  Service: Endoscopy;  Laterality: N/A;  . EYE SURGERY Bilateral age 65    for cross eyes  . MOUTH SURGERY      has Asthma; Prediabetes; COPD (chronic obstructive pulmonary disease) with emphysema ([name]); Esophageal cancer ([name]); Encounter for nonprocreative genetic counseling; Hypersensitivity reaction; and Extravasation accident on his problem list.    is allergic to penicillins.  Allergies as of 10/31/2016      Reactions   Penicillins    Has patient had a PCN reaction causing immediate rash, facial/tongue/throat swelling, SOB or lightheadedness with hypotension: No Has patient had a PCN reaction causing severe rash involving mucus membranes or skin necrosis: No Has patient had a PCN reaction that required hospitalization No Has patient had a PCN reaction occurring  within the last 10 years: No If all of the above answers are "NO", then may proceed with Cephalosporin use. Unknown Childhood reaction      Medication List       Accurate as of 10/31/16 11:59 PM. Always use your most recent med list.          fluticasone 50 MCG/ACT nasal spray Commonly known as:   FLONASE Place 1 spray into both nostrils daily as needed for allergies or rhinitis.   ibuprofen 200 MG tablet Commonly known as:  ADVIL,MOTRIN Take 800 mg by mouth daily as needed for headache or moderate pain.   morphine 20 MG/ML concentrated solution Commonly known as:  ROXANOL Take 0.25 mLs (5 mg total) by mouth every 6 (six) hours as needed for severe pain.   ondansetron 8 MG tablet Commonly known as:  ZOFRAN Take 1 tablet (8 mg total) by mouth 2 (two) times daily as needed for refractory nausea / vomiting. Start on day 3 after chemo.   oseltamivir 75 MG capsule Commonly known as:  TAMIFLU Take 1 capsule (75 mg total) by mouth daily.   pantoprazole 40 MG tablet Commonly known as:  PROTONIX Take 1 tablet (40 mg total) by mouth 2 (two) times daily.   PROAIR HFA 108 (90 Base) MCG/ACT inhaler Generic drug:  albuterol INHALE 2 PUFFS INTO THE LUNGS EVERY 6 (SIX) HOURS AS NEEDED FOR WHEEZING OR SHORTNESS OF BREATH.   prochlorperazine 10 MG tablet Commonly known as:  COMPAZINE Take 1 tablet (10 mg total) by mouth every 6 (six) hours as needed (Nausea or vomiting).   sucralfate 1 GM/10ML suspension Commonly known as:  CARAFATE Take 10 mLs (1 g total) by mouth 4 (four) times daily -  with meals and at bedtime.   tiZANidine 4 MG tablet Commonly known as:  ZANAFLEX TAKE 1 TABLET (4 MG TOTAL) BY MOUTH EVERY 6 (SIX) HOURS AS NEEDED FOR MUSCLE SPASMS.        PHYSICAL EXAMINATION  Oncology Vitals 10/31/2016 10/31/2016  Height - -  Weight - -  Weight (lbs) - -  BMI (kg/m2) - -  Temp 98.5 98.3  Pulse 76 80  Resp - -  SpO2 97 -  BSA (m2) - -   BP Readings from Last 2 Encounters:  10/31/16 119/69  10/26/16 132/85    Physical Exam  Constitutional: He is oriented to person, place, and time and well-developed, well-nourished, and in no distress.  HENT:  Head: Normocephalic and atraumatic.  Mouth/Throat: Oropharynx is clear and moist.  Eyes: Conjunctivae and EOM are normal.  Pupils are equal, round, and reactive to light. Right eye exhibits no discharge. Left eye exhibits no discharge. No scleral icterus.  Neck: Normal range of motion. Neck supple. No JVD present. No tracheal deviation present. No thyromegaly present.  Cardiovascular: Normal rate, regular rhythm, normal heart sounds and intact distal pulses.   Pulmonary/Chest: Effort normal and breath sounds normal. No respiratory distress. He has no wheezes. He has no rales. He exhibits no tenderness.  Abdominal: Soft. Bowel sounds are normal. He exhibits no distension and no mass. There is no tenderness. There is no rebound and no guarding.  Musculoskeletal: Normal range of motion. He exhibits edema. He exhibits no tenderness or deformity.  Mild edema to the right forearm at site of possible extravasation.  Lymphadenopathy:    He has no cervical adenopathy.  Neurological: He is alert and oriented to person, place, and time. Gait normal.  Skin: Skin is warm and  dry. No rash noted. No erythema. No pallor.  Psychiatric:  Patient appears anxious.  Nursing note and vitals reviewed.   LABORATORY DATA:. Appointment on 10/31/2016  Component Date Value Ref Range Status  . Sodium 10/31/2016 138  136 - 145 mEq/L Final  . Potassium 10/31/2016 3.9  3.5 - 5.1 mEq/L Final  . Chloride 10/31/2016 105  98 - 109 mEq/L Final  . CO2 10/31/2016 22  22 - 29 mEq/L Final  . Glucose 10/31/2016 117  70 - 140 mg/dl Final  . BUN 10/31/2016 11.8  7.0 - 26.0 mg/dL Final  . Creatinine 10/31/2016 0.7  0.7 - 1.3 mg/dL Final  . Total Bilirubin 10/31/2016 0.35  0.20 - 1.20 mg/dL Final  . Alkaline Phosphatase 10/31/2016 98  40 - 150 U/L Final  . AST 10/31/2016 15  5 - 34 U/L Final  . ALT 10/31/2016 19  0 - 55 U/L Final  . Total Protein 10/31/2016 7.7  6.4 - 8.3 g/dL Final  . Albumin 10/31/2016 3.5  3.5 - 5.0 g/dL Final  . Calcium 10/31/2016 9.7  8.4 - 10.4 mg/dL Final  . Anion Gap 10/31/2016 11  3 - 11 mEq/L Final  . EGFR 10/31/2016 >90   >90 ml/min/1.73 m2 Final    RADIOGRAPHIC STUDIES: No results found.  ASSESSMENT/PLAN:    Hypersensitivity reaction Patient presented to the [facility] today to receive his first cycle of Taxol/carboplatin chemotherapy regimen.  He experienced a mild hypersensitivity reaction which consisted of throat feeling sore, some cramping in his abdomen and back, and some mild flushing while he was receiving the Taxol infusion.  Infusion was held; and patient was given hypersensitivity.  Medications per protocol.  All symptoms resolve; and patient was able to complete his chemotherapy today.  As directed.    Extravasation accident Patient had almost completed the Taxol portion of his carboplatin/Taxol chemotherapy regimen today; when it was noted that patient had a probable extravasation to the right forearm peripherally inserted IV infusion.  Patient denied any tenderness or pain to the area.  However, on exam.  It does appear the patient has some edema, most likely from extravasation.  Explained with the patient and his wife that Taxol is considered an irritant.  Frequent compress was placed medially to the site.  After attempting to aspirate any fluid from the extravasation site per the [facility].  Patient was advised to elevate his arm above the level.  Whenever he is at rest and to keep a compress on it multiple times throughout the day.  He should watch/monitor the site carefully.  He will return for the next 2 days for a brief evaluation of the site as well.  Patient was encouraged to call/return or go directly to the emergency department for any worsening symptoms whatsoever.  All extravasation protocol paperwork was filled out as well.  Esophageal cancer [facility]) Patient presented to the [facility] today.  Receive his first cycle of Taxol/carboplatin chemotherapy regimen.  Patient also continues to receive daily radiation treatment. See further notes for details of today's  visit.  Patient is scheduled to return on 11/06/2016 for labs and a follow-up visit.   Patient stated understanding of all instructions; and was in agreement with this plan of care. The patient knows to call the clinic with any problems, questions or concerns.   Total time spent with patient was 25 minutes;  with greater than 75 percent of that time spent in face to face counseling regarding patient's symptoms,  and coordination of care and follow up.  Disclaimer:This dictation was prepared with Dragon/digital dictation along with [employer]. Any transcriptional errors that result from this process are unintentional.  Drue Second, NP 11/01/2016

## 2016-11-01 NOTE — Assessment & Plan Note (Signed)
Patient presented to the [facility] today to receive his first cycle of Taxol/carboplatin chemotherapy regimen.  He experienced a mild hypersensitivity reaction which consisted of throat feeling sore, some cramping in his abdomen and back, and some mild flushing while he was receiving the Taxol infusion.  Infusion was held; and patient was given hypersensitivity.  Medications per protocol.  All symptoms resolve; and patient was able to complete his chemotherapy today.  As directed.

## 2016-11-01 NOTE — Progress Notes (Signed)
Patient education , my business crad, Radiation therapy and you book, biafine cream given, discussed fatigue, throat changes, , difficulty swallowing, swelling larynx, may need IVF"S, weight loss, dehydration, , may need to go to 5-6 smaller meals,softer foods, and snacks in between meals such as ensure/boost nutritional supplement,skin irritating, hair loss on treated area, cough, shortness breath,choose foods that are high in calories and protein, stay hydrated, drink plenty water, popsicles, , use dove sopa unscented, be gental showering/baths,luke warm water, no rubbing,scratching or scrubing chest, may ned to get crafate rx if food gets stuck  , apply biafine to chest area after rad tx daily and prn, just not 4 hours prior to rad txs; teach back given

## 2016-11-02 ENCOUNTER — Encounter: Payer: Self-pay | Admitting: Radiation Oncology

## 2016-11-02 ENCOUNTER — Ambulatory Visit
Admission: RE | Admit: 2016-11-02 | Discharge: 2016-11-02 | Disposition: A | Discharge status: Home / Self Care | Payer: Medicaid Other | Source: Ambulatory Visit | Attending: Radiation Oncology | Admitting: Radiation Oncology

## 2016-11-02 ENCOUNTER — Ambulatory Visit: Payer: Medicaid Other

## 2016-11-02 VITALS — BP 133/88 | HR 81 | Temp 97.7°F | Wt 150.4 lb

## 2016-11-02 DIAGNOSIS — Z510 Encounter for antineoplastic radiation therapy: Secondary | ICD-10-CM | POA: Diagnosis not present

## 2016-11-02 DIAGNOSIS — C159 Malignant neoplasm of esophagus, unspecified: Secondary | ICD-10-CM

## 2016-11-02 NOTE — Progress Notes (Signed)
Pt states that his site has stayed the same as far as being sore, a mild tenderness, "no better and no worse". Site has no edema, no redness, no new concerns.VSS Informed pt to come back for check #3 on 11/07/16 at 1200 and to call with any new concerns or changes. Pt verbalized understanding.

## 2016-11-02 NOTE — Progress Notes (Signed)
  Radiation Oncology         [phone] ________________________________  Name: Darrell Cunningham MRN: [id number]  Date: 11/02/2016  DOB: 1961-01-22  Weekly Radiation Therapy Management    ICD-9-CM ICD-10-CM   1. Malignant neoplasm of esophagus, unspecified location (HCC) 150.9 C15.9      Current Dose: 7.2 Gy     Planned Dose:  50.4 Gy  Narrative . . . . . . . . The patient presents for routine under treatment assessment. Pt continues to endorse difficulty swallowing solids. Pt is taking Carafate, beginning yesterday. He also endorses eating softer foods and taking Zofran for nausea. Pt is drinking 2-3 cans of Boost nutritional supplement every day. He complains of pain in the left side upon belching.                           The patient is otherwise without complaint.                                 Set-up films were reviewed.                                 The chart was checked. Physical Findings. . .  weight is 150 lb 6.4 oz (68.2 kg). His oral temperature is 97.7 F (36.5 C). His blood pressure is 133/88 and his pulse is 81. . Weight essentially stable.  No significant changes. Lungs are clear to auscultation bilaterally. Heart has regular rate and rhythm. Abdomen soft, non-tender, normal bowel sounds.   Impression . . . . . . . The patient is tolerating radiation. Plan . . . . . . . . . . . . Continue treatment as planned.  ________________________________   Blair Promise, PhD, MD  This document serves as a record of services personally performed by Gery Pray, MD. It was created on his behalf by Linward Natal, a trained medical scribe. The creation of this record is based on the scribe's personal observations and the provider's statements to them. This document has been checked and approved by the attending provider.

## 2016-11-02 NOTE — Progress Notes (Signed)
Weekly rad tx esophagus  4/ 28 completed,  Pt education done 11/01/16, started Carafate yesterday, still has difficulty swallowing solids, eating softer foods, too zofran for his nausea,  When he belchs his left side hurts, 4:28 PM BP 133/88 (BP Location: Right Arm, Patient Position: Sitting, Cuff Size: Normal)   Pulse 81   Temp 97.7 F (36.5 C) (Oral)   Wt 150 lb 6.4 oz (68.2 kg)   BMI 28.42 kg/m   Wt Readings from Last 3 Encounters:  11/02/16 150 lb 6.4 oz (68.2 kg)  11/01/16 150 lb 3.2 oz (68.1 kg)  10/25/16 145 lb 6.4 oz (66 kg)

## 2016-11-03 ENCOUNTER — Ambulatory Visit
Admission: RE | Admit: 2016-11-03 | Discharge: 2016-11-03 | Disposition: A | Discharge status: Home / Self Care | Payer: Medicaid Other | Source: Ambulatory Visit | Attending: Radiation Oncology | Admitting: Radiation Oncology

## 2016-11-03 DIAGNOSIS — Z510 Encounter for antineoplastic radiation therapy: Secondary | ICD-10-CM | POA: Diagnosis not present

## 2016-11-06 ENCOUNTER — Other Ambulatory Visit (HOSPITAL_BASED_OUTPATIENT_CLINIC_OR_DEPARTMENT_OTHER): Payer: Medicaid Other

## 2016-11-06 ENCOUNTER — Ambulatory Visit
Admission: RE | Admit: 2016-11-06 | Discharge: 2016-11-06 | Disposition: A | Discharge status: Home / Self Care | Payer: Medicaid Other | Source: Ambulatory Visit | Attending: Radiation Oncology | Admitting: Radiation Oncology

## 2016-11-06 ENCOUNTER — Telehealth: Payer: Self-pay | Admitting: Hematology

## 2016-11-06 ENCOUNTER — Ambulatory Visit (HOSPITAL_BASED_OUTPATIENT_CLINIC_OR_DEPARTMENT_OTHER): Payer: Medicaid Other | Admitting: Hematology

## 2016-11-06 ENCOUNTER — Encounter: Payer: Self-pay | Admitting: Hematology

## 2016-11-06 VITALS — BP 133/85 | HR 101 | Temp 98.7°F | Resp 16 | Ht 61.0 in | Wt 141.0 lb

## 2016-11-06 DIAGNOSIS — K5900 Constipation, unspecified: Secondary | ICD-10-CM | POA: Diagnosis not present

## 2016-11-06 DIAGNOSIS — R070 Pain in throat: Secondary | ICD-10-CM | POA: Diagnosis not present

## 2016-11-06 DIAGNOSIS — C155 Malignant neoplasm of lower third of esophagus: Secondary | ICD-10-CM

## 2016-11-06 DIAGNOSIS — R1310 Dysphagia, unspecified: Secondary | ICD-10-CM

## 2016-11-06 DIAGNOSIS — Z510 Encounter for antineoplastic radiation therapy: Secondary | ICD-10-CM | POA: Diagnosis not present

## 2016-11-06 DIAGNOSIS — Z87891 Personal history of nicotine dependence: Secondary | ICD-10-CM | POA: Diagnosis not present

## 2016-11-06 LAB — CBC WITH DIFFERENTIAL/PLATELET
BASO%: 0.3 % (ref 0.0–2.0)
BASOS ABS: 0 10*3/uL (ref 0.0–0.1)
EOS%: 1.1 % (ref 0.0–7.0)
Eosinophils Absolute: 0.1 10*3/uL (ref 0.0–0.5)
HCT: 39.2 % (ref 38.4–49.9)
HGB: 13.3 g/dL (ref 13.0–17.1)
LYMPH#: 0.7 10*3/uL — AB (ref 0.9–3.3)
LYMPH%: 8.8 % — ABNORMAL LOW (ref 14.0–49.0)
MCH: 29.2 pg (ref 27.2–33.4)
MCHC: 33.9 g/dL (ref 32.0–36.0)
MCV: 86 fL (ref 79.3–98.0)
MONO#: 0.4 10*3/uL (ref 0.1–0.9)
MONO%: 4.9 % (ref 0.0–14.0)
NEUT#: 6.4 10*3/uL (ref 1.5–6.5)
NEUT%: 84.9 % — AB (ref 39.0–75.0)
Platelets: 201 10*3/uL (ref 140–400)
RBC: 4.56 10*6/uL (ref 4.20–5.82)
RDW: 13.5 % (ref 11.0–14.6)
WBC: 7.5 10*3/uL (ref 4.0–10.3)
nRBC: 0 % (ref 0–0)

## 2016-11-06 LAB — COMPREHENSIVE METABOLIC PANEL
ALBUMIN: 3.4 g/dL — AB (ref 3.5–5.0)
ALK PHOS: 91 U/L (ref 40–150)
ALT: 29 U/L (ref 0–55)
AST: 15 U/L (ref 5–34)
Anion Gap: 12 mEq/L — ABNORMAL HIGH (ref 3–11)
BUN: 15.3 mg/dL (ref 7.0–26.0)
CALCIUM: 9.6 mg/dL (ref 8.4–10.4)
CO2: 24 mEq/L (ref 22–29)
Chloride: 99 mEq/L (ref 98–109)
Creatinine: 0.7 mg/dL (ref 0.7–1.3)
Glucose: 111 mg/dl (ref 70–140)
POTASSIUM: 4.2 meq/L (ref 3.5–5.1)
SODIUM: 134 meq/L — AB (ref 136–145)
Total Bilirubin: 0.41 mg/dL (ref 0.20–1.20)
Total Protein: 7.7 g/dL (ref 6.4–8.3)

## 2016-11-06 LAB — CEA (IN HOUSE-CHCC): CEA (CHCC-In House): 910.11 ng/mL — ABNORMAL HIGH (ref 0.00–5.00)

## 2016-11-06 MED ORDER — MORPHINE SULFATE (CONCENTRATE) 20 MG/ML PO SOLN: 5.0000 mg | Freq: Four times a day (QID) | ORAL | 0 refills | Status: DC | PRN

## 2016-11-06 NOTE — Telephone Encounter (Signed)
No los per 11/06/16 visit.

## 2016-11-07 ENCOUNTER — Other Ambulatory Visit: Payer: Medicaid Other

## 2016-11-07 ENCOUNTER — Other Ambulatory Visit: Payer: Self-pay | Admitting: General Surgery

## 2016-11-07 ENCOUNTER — Ambulatory Visit (HOSPITAL_BASED_OUTPATIENT_CLINIC_OR_DEPARTMENT_OTHER): Payer: Medicaid Other

## 2016-11-07 ENCOUNTER — Ambulatory Visit
Admission: RE | Admit: 2016-11-07 | Discharge: 2016-11-07 | Disposition: A | Discharge status: Home / Self Care | Payer: Medicaid Other | Source: Ambulatory Visit | Attending: Radiation Oncology | Admitting: Radiation Oncology

## 2016-11-07 ENCOUNTER — Other Ambulatory Visit: Payer: Self-pay | Admitting: *Deleted

## 2016-11-07 ENCOUNTER — Ambulatory Visit: Payer: Medicaid Other

## 2016-11-07 ENCOUNTER — Encounter: Payer: Self-pay | Admitting: Hematology

## 2016-11-07 VITALS — BP 129/87 | HR 112 | Temp 98.4°F | Resp 17

## 2016-11-07 DIAGNOSIS — Z510 Encounter for antineoplastic radiation therapy: Secondary | ICD-10-CM | POA: Diagnosis not present

## 2016-11-07 DIAGNOSIS — Z5111 Encounter for antineoplastic chemotherapy: Secondary | ICD-10-CM

## 2016-11-07 DIAGNOSIS — C155 Malignant neoplasm of lower third of esophagus: Secondary | ICD-10-CM

## 2016-11-07 LAB — CANCER ANTIGEN 19-9: CAN 19-9: 22 U/mL (ref 0–35)

## 2016-11-07 MED ORDER — SODIUM CHLORIDE 0.9 % IV SOLN
242.2000 mg | Freq: Once | INTRAVENOUS | Status: AC
  Administered 2016-11-07: 240 mg via INTRAVENOUS
  Filled 2016-11-07: qty 24

## 2016-11-07 MED ORDER — PALONOSETRON HCL INJECTION 0.25 MG/5ML
0.2500 mg | Freq: Once | INTRAVENOUS | Status: AC
  Administered 2016-11-07: 0.25 mg via INTRAVENOUS

## 2016-11-07 MED ORDER — SODIUM CHLORIDE 0.9 % IV SOLN
Freq: Once | INTRAVENOUS | Status: AC
  Administered 2016-11-07: 12:00:00 via INTRAVENOUS

## 2016-11-07 MED ORDER — SODIUM CHLORIDE 0.9 % IV SOLN
20.0000 mg | Freq: Once | INTRAVENOUS | Status: AC
  Administered 2016-11-07: 20 mg via INTRAVENOUS
  Filled 2016-11-07: qty 2

## 2016-11-07 MED ORDER — HEPARIN SOD (PORK) LOCK FLUSH 100 UNIT/ML IV SOLN
500.0000 [IU] | Freq: Once | INTRAVENOUS | Status: DC | PRN
  Filled 2016-11-07: qty 5

## 2016-11-07 MED ORDER — PACLITAXEL PROTEIN-BOUND CHEMO INJECTION 100 MG
50.0000 mg/m2 | Freq: Once | INTRAVENOUS | Status: AC
  Administered 2016-11-07: 75 mg via INTRAVENOUS
  Filled 2016-11-07: qty 15

## 2016-11-07 MED ORDER — PALONOSETRON HCL INJECTION 0.25 MG/5ML
INTRAVENOUS | Status: AC
  Filled 2016-11-07: qty 5

## 2016-11-07 MED ORDER — SODIUM CHLORIDE 0.9% FLUSH
10.0000 mL | INTRAVENOUS | Status: DC | PRN
  Filled 2016-11-07: qty 10

## 2016-11-07 NOTE — Progress Notes (Signed)
Nutrition Follow-up:  Nutrition follow-up completed during infusion this pm.  Patient of Dr Burr Medico currently receiving chemotherapy and radiation therapy for esophageal cancer.  Patient reports that he has been eating chicken noodle soup, ensure plus 2 per day, slushies.  Patient reports that he has been taking carafate.  Reports no BM in the last 3 days but reports he is going to get some miralax.    Medications: reviewed  Labs: reviewed  Anthropometrics:   Patient weight decreased to 141 pounds on 4/9 from 150. 2 pounds on 4/4.   3/28 weight of 145 pounds 3/22 weight of 147 pounds    NUTRITION DIAGNOSIS: Food and nutrition related knowledge deficit continues   MALNUTRITION DIAGNOSIS: Will continue to assess   INTERVENTION:   Encouraged patient to chop/grind/puree foods to maintain good nutrition.  Patient reports that he has a blender. Encouraged patient to increase ensure plus supplement to 4-5 times per day to increase calories and protein.  Discussed foods that are high in calories and protein. Provided soft moist protein handout.  Teach back used.     MONITORING, EVALUATION, GOAL: Patient will tolerate adequate calories and protein to maintain current weight   NEXT VISIT: April 24 in infusion  Santasia Cunningham Darrell Cunningham, [place], [place] Registered Dietitian [phone] (pager)

## 2016-11-07 NOTE — Patient Instructions (Signed)
[facility] Discharge Instructions for Patients Receiving Chemotherapy  Today you received the following chemotherapy agents: Abraxane and Carboplatin.  To help prevent nausea and vomiting after your treatment, we encourage you to take your nausea medication: Compazine. Take one every 6 hours as needed. For nausea on or after 11/03/16, you may take Zofran every 12 hours as needed.  If you develop nausea and vomiting that is not controlled by your nausea medication, call the clinic.   BELOW ARE SYMPTOMS THAT SHOULD BE REPORTED IMMEDIATELY:  *FEVER GREATER THAN 100.5 F  *CHILLS WITH OR WITHOUT FEVER  NAUSEA AND VOMITING THAT IS NOT CONTROLLED WITH YOUR NAUSEA MEDICATION  *UNUSUAL SHORTNESS OF BREATH  *UNUSUAL BRUISING OR BLEEDING  TENDERNESS IN MOUTH AND THROAT WITH OR WITHOUT PRESENCE OF ULCERS  *URINARY PROBLEMS  *BOWEL PROBLEMS  UNUSUAL RASH Items with * indicate a potential emergency and should be followed up as soon as possible.  Feel free to call the clinic should you have any questions or concerns. The clinic phone number is [phone].  Please show the [facility] at check-in to the Emergency Department and triage nurse.  Nanoparticle Albumin-Bound Paclitaxel injection What is this medicine? NANOPARTICLE ALBUMIN-BOUND PACLITAXEL (Na no PAHR ti kuhl al BYOO muhn-bound PAK li TAX el) is a chemotherapy drug. It targets fast dividing cells, like cancer cells, and causes these cells to die. This medicine is used to treat advanced breast cancer and advanced lung cancer. This medicine may be used for other purposes; ask your health care provider or pharmacist if you have questions. COMMON BRAND NAME(S): Abraxane What should I tell my health care provider before I take this medicine? They need to know if you have any of these conditions: -kidney disease -liver disease -low blood counts, like low platelets, red blood cells, or white blood cells -recent or  ongoing radiation therapy -an unusual or allergic reaction to paclitaxel, albumin, other chemotherapy, other medicines, foods, dyes, or preservatives -pregnant or trying to get pregnant -breast-feeding How should I use this medicine? This drug is given as an infusion into a vein. It is administered in a hospital or clinic by a specially trained health care professional. Talk to your pediatrician regarding the use of this medicine in children. Special care may be needed. Overdosage: If you think you have taken too much of this medicine contact a poison control center or emergency room at once. NOTE: This medicine is only for you. Do not share this medicine with others. What if I miss a dose? It is important not to miss your dose. Call your doctor or health care professional if you are unable to keep an appointment. What may interact with this medicine? -cyclosporine -diazepam -ketoconazole -medicines to increase blood counts like filgrastim, pegfilgrastim, sargramostim -other chemotherapy drugs like cisplatin, doxorubicin, epirubicin, etoposide, teniposide, vincristine -quinidine -testosterone -vaccines -verapamil Talk to your doctor or health care professional before taking any of these medicines: -acetaminophen -aspirin -ibuprofen -ketoprofen -naproxen This list may not describe all possible interactions. Give your health care provider a list of all the medicines, herbs, non-prescription drugs, or dietary supplements you use. Also tell them if you smoke, drink alcohol, or use illegal drugs. Some items may interact with your medicine. What should I watch for while using this medicine? Your condition will be monitored carefully while you are receiving this medicine. You will need important blood work done while you are taking this medicine. This medicine can cause serious allergic reactions. If you experience  allergic reactions like skin rash, itching or hives, swelling of the face,  lips, or tongue, tell your doctor or health care professional right away. In some cases, you may be given additional medicines to help with side effects. Follow all directions for their use. This drug may make you feel generally unwell. This is not uncommon, as chemotherapy can affect healthy cells as well as cancer cells. Report any side effects. Continue your course of treatment even though you feel ill unless your doctor tells you to stop. Call your doctor or health care professional for advice if you get a fever, chills or sore throat, or other symptoms of a cold or flu. Do not treat yourself. This drug decreases your body's ability to fight infections. Try to avoid being around people who are sick. This medicine may increase your risk to bruise or bleed. Call your doctor or health care professional if you notice any unusual bleeding. Be careful brushing and flossing your teeth or using a toothpick because you may get an infection or bleed more easily. If you have any dental work done, tell your dentist you are receiving this medicine. Avoid taking products that contain aspirin, acetaminophen, ibuprofen, naproxen, or ketoprofen unless instructed by your doctor. These medicines may hide a fever. Do not become pregnant while taking this medicine. Women should inform their doctor if they wish to become pregnant or think they might be pregnant. There is a potential for serious side effects to an unborn child. Talk to your health care professional or pharmacist for more information. Do not breast-feed an infant while taking this medicine. Men are advised not to father a child while receiving this medicine. What side effects may I notice from receiving this medicine? Side effects that you should report to your doctor or health care professional as soon as possible: -allergic reactions like skin rash, itching or hives, swelling of the face, lips, or tongue -low blood counts - This drug may decrease the  number of white blood cells, red blood cells and platelets. You may be at increased risk for infections and bleeding. -signs of infection - fever or chills, cough, sore throat, pain or difficulty passing urine -signs of decreased platelets or bleeding - bruising, pinpoint red spots on the skin, black, tarry stools, nosebleeds -signs of decreased red blood cells - unusually weak or tired, fainting spells, lightheadedness -breathing problems -changes in vision -chest pain -high or low blood pressure -mouth sores -nausea and vomiting -pain, swelling, redness or irritation at the injection site -pain, tingling, numbness in the hands or feet -slow or irregular heartbeat -swelling of the ankle, feet, hands Side effects that usually do not require medical attention (report to your doctor or health care professional if they continue or are bothersome): -aches, pains -changes in the color of fingernails -diarrhea -hair loss -loss of appetite This list may not describe all possible side effects. Call your doctor for medical advice about side effects. You may report side effects to FDA at 1-800-FDA-1088. Where should I keep my medicine? This drug is given in a hospital or clinic and will not be stored at home. NOTE: This sheet is a summary. It may not cover all possible information. If you have questions about this medicine, talk to your doctor, pharmacist, or health care provider.  2018 Elsevier/Gold Standard (2015-05-19 10:05:20)  Carboplatin injection What is this medicine? CARBOPLATIN (KAR boe pla tin) is a chemotherapy drug. It targets fast dividing cells, like cancer cells, and causes these cells to  die. This medicine is used to treat ovarian cancer and many other cancers. This medicine may be used for other purposes; ask your health care provider or pharmacist if you have questions. COMMON BRAND NAME(S): Paraplatin What should I tell my health care provider before I take this  medicine? They need to know if you have any of these conditions: -blood disorders -hearing problems -kidney disease -recent or ongoing radiation therapy -an unusual or allergic reaction to carboplatin, cisplatin, other chemotherapy, other medicines, foods, dyes, or preservatives -pregnant or trying to get pregnant -breast-feeding How should I use this medicine? This drug is usually given as an infusion into a vein. It is administered in a hospital or clinic by a specially trained health care professional. Talk to your pediatrician regarding the use of this medicine in children. Special care may be needed. Overdosage: If you think you have taken too much of this medicine contact a poison control center or emergency room at once. NOTE: This medicine is only for you. Do not share this medicine with others. What if I miss a dose? It is important not to miss a dose. Call your doctor or health care professional if you are unable to keep an appointment. What may interact with this medicine? -medicines for seizures -medicines to increase blood counts like filgrastim, pegfilgrastim, sargramostim -some antibiotics like amikacin, gentamicin, neomycin, streptomycin, tobramycin -vaccines Talk to your doctor or health care professional before taking any of these medicines: -acetaminophen -aspirin -ibuprofen -ketoprofen -naproxen This list may not describe all possible interactions. Give your health care provider a list of all the medicines, herbs, non-prescription drugs, or dietary supplements you use. Also tell them if you smoke, drink alcohol, or use illegal drugs. Some items may interact with your medicine. What should I watch for while using this medicine? Your condition will be monitored carefully while you are receiving this medicine. You will need important blood work done while you are taking this medicine. This drug may make you feel generally unwell. This is not uncommon, as chemotherapy  can affect healthy cells as well as cancer cells. Report any side effects. Continue your course of treatment even though you feel ill unless your doctor tells you to stop. In some cases, you may be given additional medicines to help with side effects. Follow all directions for their use. Call your doctor or health care professional for advice if you get a fever, chills or sore throat, or other symptoms of a cold or flu. Do not treat yourself. This drug decreases your body's ability to fight infections. Try to avoid being around people who are sick. This medicine may increase your risk to bruise or bleed. Call your doctor or health care professional if you notice any unusual bleeding. Be careful brushing and flossing your teeth or using a toothpick because you may get an infection or bleed more easily. If you have any dental work done, tell your dentist you are receiving this medicine. Avoid taking products that contain aspirin, acetaminophen, ibuprofen, naproxen, or ketoprofen unless instructed by your doctor. These medicines may hide a fever. Do not become pregnant while taking this medicine. Women should inform their doctor if they wish to become pregnant or think they might be pregnant. There is a potential for serious side effects to an unborn child. Talk to your health care professional or pharmacist for more information. Do not breast-feed an infant while taking this medicine. What side effects may I notice from receiving this medicine? Side effects that you  should report to your doctor or health care professional as soon as possible: -allergic reactions like skin rash, itching or hives, swelling of the face, lips, or tongue -signs of infection - fever or chills, cough, sore throat, pain or difficulty passing urine -signs of decreased platelets or bleeding - bruising, pinpoint red spots on the skin, black, tarry stools, nosebleeds -signs of decreased red blood cells - unusually weak or tired,  fainting spells, lightheadedness -breathing problems -changes in hearing -changes in vision -chest pain -high blood pressure -low blood counts - This drug may decrease the number of white blood cells, red blood cells and platelets. You may be at increased risk for infections and bleeding. -nausea and vomiting -pain, swelling, redness or irritation at the injection site -pain, tingling, numbness in the hands or feet -problems with balance, talking, walking -trouble passing urine or change in the amount of urine Side effects that usually do not require medical attention (report to your doctor or health care professional if they continue or are bothersome): -hair loss -loss of appetite -metallic taste in the mouth or changes in taste This list may not describe all possible side effects. Call your doctor for medical advice about side effects. You may report side effects to FDA at 1-800-FDA-1088. Where should I keep my medicine? This drug is given in a hospital or clinic and will not be stored at home. NOTE: This sheet is a summary. It may not cover all possible information. If you have questions about this medicine, talk to your doctor, pharmacist, or health care provider.  2018 Elsevier/Gold Standard (2007-10-22 14:38:05)

## 2016-11-07 NOTE — Progress Notes (Signed)
After reviewing pt's treatment plan, there aren't any foundations with funding available for his Dx so I reached out to Volo requesting she meet w/ him to see if he would like to apply for the [employer] to assist w/ gasoline.

## 2016-11-07 NOTE — Addendum Note (Signed)
Addended by: [name] on: 11/07/2016 09:44 AM   Modules accepted: Orders

## 2016-11-08 ENCOUNTER — Other Ambulatory Visit: Payer: Self-pay | Admitting: *Deleted

## 2016-11-08 ENCOUNTER — Other Ambulatory Visit: Payer: Self-pay | Admitting: Hematology

## 2016-11-08 ENCOUNTER — Encounter (HOSPITAL_COMMUNITY)
Admission: RE | Admit: 2016-11-08 | Discharge: 2016-11-08 | Disposition: A | Discharge status: Home / Self Care | Payer: Medicaid Other | Source: Ambulatory Visit | Attending: Hematology | Admitting: Hematology

## 2016-11-08 ENCOUNTER — Ambulatory Visit: Payer: Medicaid Other

## 2016-11-08 ENCOUNTER — Encounter (HOSPITAL_COMMUNITY): Payer: Self-pay

## 2016-11-08 ENCOUNTER — Telehealth: Payer: Self-pay | Admitting: *Deleted

## 2016-11-08 ENCOUNTER — Ambulatory Visit (HOSPITAL_COMMUNITY)
Admission: RE | Admit: 2016-11-08 | Discharge: 2016-11-08 | Disposition: A | Discharge status: Home / Self Care | Payer: Medicaid Other | Source: Ambulatory Visit | Attending: Hematology | Admitting: Hematology

## 2016-11-08 DIAGNOSIS — G801 Spastic diplegic cerebral palsy: Secondary | ICD-10-CM | POA: Insufficient documentation

## 2016-11-08 DIAGNOSIS — M1990 Unspecified osteoarthritis, unspecified site: Secondary | ICD-10-CM | POA: Diagnosis not present

## 2016-11-08 DIAGNOSIS — Z880 Allergy status to penicillin: Secondary | ICD-10-CM | POA: Diagnosis not present

## 2016-11-08 DIAGNOSIS — C159 Malignant neoplasm of esophagus, unspecified: Secondary | ICD-10-CM

## 2016-11-08 DIAGNOSIS — Z87891 Personal history of nicotine dependence: Secondary | ICD-10-CM | POA: Diagnosis not present

## 2016-11-08 DIAGNOSIS — J45909 Unspecified asthma, uncomplicated: Secondary | ICD-10-CM | POA: Diagnosis not present

## 2016-11-08 DIAGNOSIS — Z5111 Encounter for antineoplastic chemotherapy: Secondary | ICD-10-CM | POA: Diagnosis not present

## 2016-11-08 DIAGNOSIS — E785 Hyperlipidemia, unspecified: Secondary | ICD-10-CM | POA: Diagnosis not present

## 2016-11-08 HISTORY — PX: IR US GUIDE VASC ACCESS RIGHT: IMG2390

## 2016-11-08 HISTORY — PX: IR FLUORO GUIDE PORT INSERTION RIGHT: IMG5741

## 2016-11-08 LAB — BASIC METABOLIC PANEL
ANION GAP: 7 (ref 5–15)
BUN: 14 mg/dL (ref 6–20)
CALCIUM: 9.5 mg/dL (ref 8.9–10.3)
CO2: 24 mmol/L (ref 22–32)
CREATININE: 0.52 mg/dL — AB (ref 0.61–1.24)
Chloride: 104 mmol/L (ref 101–111)
GFR calc Af Amer: >60 mL/min (ref >60)
Glucose, Bld: 109 mg/dL — ABNORMAL HIGH (ref 65–99)
Potassium: 4.5 mmol/L (ref 3.5–5.1)
Sodium: 135 mmol/L (ref 135–145)

## 2016-11-08 LAB — CBC WITH DIFFERENTIAL/PLATELET
Basophils Absolute: 0 10*3/uL (ref 0.0–0.1)
Basophils Relative: 0 %
EOS ABS: 0 10*3/uL (ref 0.0–0.7)
Eosinophils Relative: 0 %
HCT: 36.1 % — ABNORMAL LOW (ref 39.0–52.0)
Hemoglobin: 12.3 g/dL — ABNORMAL LOW (ref 13.0–17.0)
LYMPHS ABS: 0.3 10*3/uL — AB (ref 0.7–4.0)
Lymphocytes Relative: 5 %
MCH: 29.1 pg (ref 26.0–34.0)
MCHC: 34.1 g/dL (ref 30.0–36.0)
MCV: 85.3 fL (ref 78.0–100.0)
MONOS PCT: 6 %
Monocytes Absolute: 0.3 10*3/uL (ref 0.1–1.0)
Neutro Abs: 4.8 10*3/uL (ref 1.7–7.7)
Neutrophils Relative %: 89 %
Platelets: 270 10*3/uL (ref 150–400)
RBC: 4.23 MIL/uL (ref 4.22–5.81)
RDW: 13.3 % (ref 11.5–15.5)
WBC: 5.3 10*3/uL (ref 4.0–10.5)

## 2016-11-08 LAB — PROTIME-INR
INR: 0.97
Prothrombin Time: 12.9 seconds (ref 11.4–15.2)

## 2016-11-08 LAB — APTT: aPTT: 30 seconds (ref 24–36)

## 2016-11-08 MED ORDER — MIDAZOLAM HCL 2 MG/2ML IJ SOLN
INTRAMUSCULAR | Status: AC | PRN
  Administered 2016-11-08 (×2): 1 mg via INTRAVENOUS

## 2016-11-08 MED ORDER — FENTANYL CITRATE (PF) 100 MCG/2ML IJ SOLN
INTRAMUSCULAR | Status: AC
  Filled 2016-11-08: qty 4

## 2016-11-08 MED ORDER — VANCOMYCIN HCL IN DEXTROSE 1-5 GM/200ML-% IV SOLN
1000.0000 mg | INTRAVENOUS | Status: AC
  Administered 2016-11-08: 1000 mg via INTRAVENOUS

## 2016-11-08 MED ORDER — LIDOCAINE-PRILOCAINE 2.5-2.5 % EX CREA: 1.0000 "application " | TOPICAL_CREAM | CUTANEOUS | 1 refills | Status: DC | PRN

## 2016-11-08 MED ORDER — HEPARIN SOD (PORK) LOCK FLUSH 100 UNIT/ML IV SOLN
INTRAVENOUS | Status: DC | PRN
  Administered 2016-11-08: 500 [IU]

## 2016-11-08 MED ORDER — FENTANYL CITRATE (PF) 100 MCG/2ML IJ SOLN
INTRAMUSCULAR | Status: AC | PRN
  Administered 2016-11-08: 50 ug via INTRAVENOUS

## 2016-11-08 MED ORDER — SODIUM CHLORIDE 0.9 % IV SOLN
INTRAVENOUS | Status: DC
  Administered 2016-11-08: 08:00:00 via INTRAVENOUS

## 2016-11-08 MED ORDER — VANCOMYCIN HCL IN DEXTROSE 1-5 GM/200ML-% IV SOLN
INTRAVENOUS | Status: AC
  Filled 2016-11-08: qty 200

## 2016-11-08 MED ORDER — HEPARIN SOD (PORK) LOCK FLUSH 100 UNIT/ML IV SOLN
INTRAVENOUS | Status: AC
  Filled 2016-11-08: qty 5

## 2016-11-08 MED ORDER — MIDAZOLAM HCL 2 MG/2ML IJ SOLN
INTRAMUSCULAR | Status: AC
  Filled 2016-11-08: qty 6

## 2016-11-08 MED ORDER — LIDOCAINE HCL 1 % IJ SOLN
INTRAMUSCULAR | Status: AC
  Filled 2016-11-08: qty 20

## 2016-11-08 MED ORDER — LIDOCAINE HCL 1 % IJ SOLN
INTRAMUSCULAR | Status: AC | PRN
  Administered 2016-11-08: 10 mL

## 2016-11-08 NOTE — Telephone Encounter (Signed)
Called pt regarding abraxane, carbo received yest.  Pt reports everything went well. The only symptom he has is constipation & he took miralax & is waiting for results.  He had his port placed today & is sore from this & R arm is sore & itchy from taxol infiltration.  He is using cold compresses at hs.  He canceled radiation today due to too much going on today & soreness.  Reminded to call with any signs or symptoms of infections or questions or concerns.

## 2016-11-08 NOTE — Discharge Instructions (Signed)
Moderate Conscious Sedation, Adult, Care After °These instructions provide you with information about caring for yourself after your procedure. Your health care provider may also give you more specific instructions. Your treatment has been planned according to current medical practices, but problems sometimes occur. Call your health care provider if you have any problems or questions after your procedure. °What can I expect after the procedure? °After your procedure, it is common: °· To feel sleepy for several hours. °· To feel clumsy and have poor balance for several hours. °· To have poor judgment for several hours. °· To vomit if you eat too soon. °Follow these instructions at home: °For at least 24 hours after the procedure:  ° °· Do not: °¨ Participate in activities where you could fall or become injured. °¨ Drive. °¨ Use heavy machinery. °¨ Drink alcohol. °¨ Take sleeping pills or medicines that cause drowsiness. °¨ Make important decisions or sign legal documents. °¨ Take care of children on your own. °· Rest. °Eating and drinking  °· Follow the diet recommended by your health care provider. °· If you vomit: °¨ Drink water, juice, or soup when you can drink without vomiting. °¨ Make sure you have little or no nausea before eating solid foods. °General instructions  °· Have a responsible adult stay with you until you are awake and alert. °· Take over-the-counter and prescription medicines only as told by your health care provider. °· If you smoke, do not smoke without supervision. °· Keep all follow-up visits as told by your health care provider. This is important. °Contact a health care provider if: °· You keep feeling nauseous or you keep vomiting. °· You feel light-headed. °· You develop a rash. °· You have a fever. °Get help right away if: °· You have trouble breathing. °This information is not intended to replace advice given to you by your health care provider. Make sure you discuss any questions you  have with your health care provider. °Document Released: 05/07/2013 Document Revised: 12/20/2015 Document Reviewed: 11/06/2015 °Elsevier Interactive Patient Education © 2017 Else[employer] Inc. ° ° °Implanted Port Insertion, Care After °This sheet gives you information about how to care for yourself after your procedure. Your health care provider may also give you more specific instructions. If you have problems or questions, contact your health care provider. °What can I expect after the procedure? °After your procedure, it is common to have: °· Discomfort at the port insertion site. °· Bruising on the skin over the port. This should improve over 3-4 days. °Follow these instructions at home: °Port care  °· After your port is placed, you will get a manufacturer's information card. The card has information about your port. Keep this card with you at all times. °· Take care of the port as told by your health care provider. Ask your health care provider if you or a family member can get training for taking care of the port at home. A home health care nurse may also take care of the port. °· Make sure to remember what type of port you have. °Incision care  °· Follow instructions from your health care provider about how to take care of your port insertion site. Make sure you: °¨ Wash your hands with soap and water before you change your bandage (dressing). If soap and water are not available, use hand sanitizer. °¨ Change your dressing as told by your health care provider. °¨ Leave stitches (sutures), skin glue, or adhesive strips in place. These skin   closures may need to stay in place for 2 weeks or longer. If adhesive strip edges start to loosen and curl up, you may trim the loose edges. Do not remove adhesive strips completely unless your health care provider tells you to do that. °· Check your port insertion site every day for signs of infection. Check for: °¨ More redness, swelling, or pain. °¨ More fluid or  blood. °¨ Warmth. °¨ Pus or a bad smell. °General instructions  °· Do not take baths, swim, or use a hot tub until your health care provider approves. °· Do not lift anything that is heavier than 10 lb (4.5 kg) for a week, or as told by your health care provider. °· Ask your health care provider when it is okay to: °¨ Return to work or school. °¨ Resume usual physical activities or sports. °· Do not drive for 24 hours if you were given a medicine to help you relax (sedative). °· Take over-the-counter and prescription medicines only as told by your health care provider. °· Wear a medical alert bracelet in case of an emergency. This will tell any health care providers that you have a port. °· Keep all follow-up visits as told by your health care provider. This is important. °Contact a health care provider if: °· You cannot flush your port with saline as directed, or you cannot draw blood from the port. °· You have a fever or chills. °· You have more redness, swelling, or pain around your port insertion site. °· You have more fluid or blood coming from your port insertion site. °· Your port insertion site feels warm to the touch. °· You have pus or a bad smell coming from the port insertion site. °Get help right away if: °· You have chest pain or shortness of breath. °· You have bleeding from your port that you cannot control. °Summary °· Take care of the port as told by your health care provider. °· Change your dressing as told by your health care provider. °· Keep all follow-up visits as told by your health care provider. °This information is not intended to replace advice given to you by your health care provider. Make sure you discuss any questions you have with your health care provider. °Document Released: 05/07/2013 Document Revised: 06/07/2016 Document Reviewed: 06/07/2016 °Elsevier Interactive Patient Education © 2017 Elsevier Inc. ° °

## 2016-11-08 NOTE — Telephone Encounter (Signed)
-----   Message from Arna Snipe, RN sent at 11/07/2016 12:39 PM EDT ----- Regarding: [facility] F/U Call Contact: [phone] 1st abraxane

## 2016-11-08 NOTE — Sedation Documentation (Signed)
PAC operational and flushed with heparin by Dr. Kathlene Cote

## 2016-11-08 NOTE — Procedures (Signed)
Interventional Radiology Procedure Note  Procedure: Placement of a right IJ approach single lumen PowerPort.  Tip is positioned at the superior cavoatrial junction and catheter is ready for immediate use.  Complications: No immediate Recommendations:  - Ok to shower tomorrow - Do not submerge for 7 days - Routine line care   Darrell Cunningham, M.D Pager:  [phone]   

## 2016-11-08 NOTE — H&P (Signed)
Chief Complaint: esophageal cancer, poor venous access  Referring Physician:Dr. [name]  Supervising Physician: Aletta Edouard  Patient Status: [facility] - Out-pt  HPI: Darrell Cunningham is an 56 y.o. male who has a history of esophageal cancer and is receiving radiation and chemotherapy. He is having trouble with venous access and a request has been made for a PAC to be placed to assist with this need.  The patient has no complaints today.  Past Medical History:  Past Medical History:  Diagnosis Date  . Allergy   . Arthritis   . Asthma    as a child  . 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 Dysphagia   . Encounter for nonprocreative genetic counseling 10/31/2016   Mr. Berndt underwent genetic counseling for hereditary cancer syndromes on 10/31/2016. Though he is a candidate for genetic testing, he declines at this time.  . Hyperlipidemia   . Neuromuscular disorder ([place])   . Pneumonia 4 yrs ago    Past Surgical History:  Past Surgical History:  Procedure Laterality Date  . EUS N/A 10/26/2016   Procedure: UPPER ENDOSCOPIC ULTRASOUND (EUS) LINEAR;  Surgeon: [name], MD;  Location: [facility] ENDOSCOPY;  Service: Endoscopy;  Laterality: N/A;  . EYE SURGERY Bilateral age 67    for cross eyes  . MOUTH SURGERY      Family History:  Family History  Problem Relation Age of Onset  . Colon cancer Maternal Grandmother 47  . Breast cancer Maternal Grandmother 55  . Breast cancer Sister 73    Deceased at 70 of breast cancer  . Ovarian cancer Maternal Aunt   . Stomach cancer Neg Hx   . Esophageal cancer Neg Hx     Social History:  reports that he quit smoking about 4 years ago. His smoking use included Cigarettes. He started smoking about 4 years ago. He has a 38.00 pack-year smoking history. He has never used smokeless tobacco. He reports that he does not drink alcohol or use drugs.  Allergies:  Allergies  Allergen Reactions  . Penicillins     Has patient had a  PCN reaction causing immediate rash, facial/tongue/throat swelling, SOB or lightheadedness with hypotension: No Has patient had a PCN reaction causing severe rash involving mucus membranes or skin necrosis: No Has patient had a PCN reaction that required hospitalization No Has patient had a PCN reaction occurring within the last 10 years: No If all of the above answers are "NO", then may proceed with Cephalosporin use. Unknown Childhood reaction    Medications: Medications reviewed in epic  Please HPI for pertinent positives, otherwise complete 10 system ROS negative, except weakness on right side secondary to his CP.  Mallampati Score: MD Evaluation Airway: WNL Heart: WNL Abdomen: WNL Chest/ Lungs: WNL ASA  Classification: 3 Mallampati/Airway Score: Two  Physical Exam: BP (!) 144/86 (BP Location: Right Arm)   Pulse 89   Temp 98 F (36.7 C) (Oral)   Resp 18   Wt 138 lb 6.4 oz (62.8 kg)   SpO2 97%   BMI 26.15 kg/m  Body mass index is 26.15 kg/m. General: pleasant, WD, WN white male who is laying in bed in NAD HEENT: head is normocephalic, atraumatic.  Sclera are noninjected.  PERRL.  Ears and nose without any masses or lesions.  Mouth is pink and moist Heart: regular, rate, and rhythm.  Normal s1,s2. No obvious murmurs, gallops, or rubs noted.  Palpable radial pulses bilaterally Lungs: CTAB, no  wheezes, rhonchi, or rales noted.  Respiratory effort nonlabored Abd: soft, NT, ND, +BS, no masses, hernias, or organomegaly Psych: A&Ox3 with an appropriate affect.   Labs: Results for orders placed or performed during the hospital encounter of 11/08/16 (from the past 48 hour(s))  CBC with Differential/Platelet     Status: Abnormal   Collection Time: 11/08/16  7:38 AM  Result Value Ref Range   WBC 5.3 4.0 - 10.5 K/uL   RBC 4.23 4.22 - 5.81 MIL/uL   Hemoglobin 12.3 (L) 13.0 - 17.0 g/dL   HCT 36.1 (L) 39.0 - 52.0 %   MCV 85.3 78.0 - 100.0 fL   MCH 29.1 26.0 - 34.0 pg   MCHC  34.1 30.0 - 36.0 g/dL   RDW 13.3 11.5 - 15.5 %   Platelets 270 150 - 400 K/uL   Neutrophils Relative % 89 %   Neutro Abs 4.8 1.7 - 7.7 K/uL   Lymphocytes Relative 5 %   Lymphs Abs 0.3 (L) 0.7 - 4.0 K/uL   Monocytes Relative 6 %   Monocytes Absolute 0.3 0.1 - 1.0 K/uL   Eosinophils Relative 0 %   Eosinophils Absolute 0.0 0.0 - 0.7 K/uL   Basophils Relative 0 %   Basophils Absolute 0.0 0.0 - 0.1 K/uL  APTT     Status: None   Collection Time: 11/08/16  7:38 AM  Result Value Ref Range   aPTT 30 24 - 36 seconds  Basic metabolic panel     Status: Abnormal   Collection Time: 11/08/16  7:38 AM  Result Value Ref Range   Sodium 135 135 - 145 mmol/L   Potassium 4.5 3.5 - 5.1 mmol/L   Chloride 104 101 - 111 mmol/L   CO2 24 22 - 32 mmol/L   Glucose, Bld 109 (H) 65 - 99 mg/dL   BUN 14 6 - 20 mg/dL   Creatinine, Ser 0.52 (L) 0.61 - 1.24 mg/dL   Calcium 9.5 8.9 - 10.3 mg/dL   GFR calc non Af Amer >60 >60 mL/min   GFR calc Af Amer >60 >60 mL/min    Comment: (NOTE) The eGFR has been calculated using the CKD EPI equation. This calculation has not been validated in all clinical situations. eGFR's persistently <60 mL/min signify possible Chronic Kidney Disease.    Anion gap 7 5 - 15  Protime-INR     Status: None   Collection Time: 11/08/16  7:38 AM  Result Value Ref Range   Prothrombin Time 12.9 11.4 - 15.2 seconds   INR 0.97     Imaging: No results found.  Assessment/Plan 1. Esophageal cancer, poor venous access We will plan to proceed with PAC placement today to assist with chemotherapy administration.  Labs and vitals have been reviewed. Risks and Benefits discussed with the patient including, but not limited to bleeding, infection, pneumothorax, or fibrin sheath development and need for additional procedures. All of the patient's questions were answered, patient is agreeable to proceed. Consent signed and in chart.   Thank you for this interesting consult.  I greatly enjoyed  meeting REUBIN BUSHNELL and look forward to participating in their care.  A copy of this report was sent to the requesting provider on this date.  Electronically Signed: Henreitta Cea 11/08/2016, 9:33 AM   I spent a total of  30 Minutes   in face to face in clinical consultation, greater than 50% of which was counseling/coordinating care for esophageal cancer, poor venous access

## 2016-11-09 ENCOUNTER — Ambulatory Visit
Admission: RE | Admit: 2016-11-09 | Discharge: 2016-11-09 | Disposition: A | Discharge status: Home / Self Care | Payer: Medicaid Other | Source: Ambulatory Visit | Attending: Radiation Oncology | Admitting: Radiation Oncology

## 2016-11-09 DIAGNOSIS — Z510 Encounter for antineoplastic radiation therapy: Secondary | ICD-10-CM | POA: Diagnosis not present

## 2016-11-10 ENCOUNTER — Ambulatory Visit
Admission: RE | Admit: 2016-11-10 | Discharge: 2016-11-10 | Disposition: A | Discharge status: Home / Self Care | Payer: Medicaid Other | Source: Ambulatory Visit | Attending: Radiation Oncology | Admitting: Radiation Oncology

## 2016-11-10 ENCOUNTER — Telehealth: Payer: Self-pay | Admitting: *Deleted

## 2016-11-10 DIAGNOSIS — Z510 Encounter for antineoplastic radiation therapy: Secondary | ICD-10-CM | POA: Diagnosis not present

## 2016-11-10 NOTE — Telephone Encounter (Signed)
Spoke with Dr. [name], patient has refills on carafate and mucinex otc recommended,spoke with the patient,he thanked MD and this Rn for returning call so soon 3:20 PM

## 2016-11-13 ENCOUNTER — Ambulatory Visit
Admission: RE | Admit: 2016-11-13 | Discharge: 2016-11-13 | Disposition: A | Discharge status: Home / Self Care | Payer: Medicaid Other | Source: Ambulatory Visit | Attending: Radiation Oncology | Admitting: Radiation Oncology

## 2016-11-13 DIAGNOSIS — Z510 Encounter for antineoplastic radiation therapy: Secondary | ICD-10-CM | POA: Diagnosis not present

## 2016-11-14 ENCOUNTER — Other Ambulatory Visit (HOSPITAL_BASED_OUTPATIENT_CLINIC_OR_DEPARTMENT_OTHER): Payer: Medicaid Other

## 2016-11-14 ENCOUNTER — Ambulatory Visit (HOSPITAL_BASED_OUTPATIENT_CLINIC_OR_DEPARTMENT_OTHER): Payer: Medicaid Other

## 2016-11-14 ENCOUNTER — Ambulatory Visit
Admission: RE | Admit: 2016-11-14 | Discharge: 2016-11-14 | Disposition: A | Discharge status: Home / Self Care | Payer: Medicaid Other | Source: Ambulatory Visit | Attending: Radiation Oncology | Admitting: Radiation Oncology

## 2016-11-14 VITALS — BP 143/87 | HR 86 | Temp 97.9°F | Resp 18

## 2016-11-14 DIAGNOSIS — Z510 Encounter for antineoplastic radiation therapy: Secondary | ICD-10-CM | POA: Diagnosis not present

## 2016-11-14 DIAGNOSIS — C155 Malignant neoplasm of lower third of esophagus: Secondary | ICD-10-CM

## 2016-11-14 DIAGNOSIS — Z5111 Encounter for antineoplastic chemotherapy: Secondary | ICD-10-CM

## 2016-11-14 LAB — COMPREHENSIVE METABOLIC PANEL
ALBUMIN: 3.2 g/dL — AB (ref 3.5–5.0)
ALK PHOS: 74 U/L (ref 40–150)
ALT: 27 U/L (ref 0–55)
AST: 14 U/L (ref 5–34)
Anion Gap: 9 mEq/L (ref 3–11)
BUN: 9.8 mg/dL (ref 7.0–26.0)
CO2: 25 mEq/L (ref 22–29)
Calcium: 9 mg/dL (ref 8.4–10.4)
Chloride: 106 mEq/L (ref 98–109)
Creatinine: 0.7 mg/dL (ref 0.7–1.3)
EGFR: >90 mL/min/{1.73_m2} (ref >90)
GLUCOSE: 104 mg/dL (ref 70–140)
Potassium: 4.5 mEq/L (ref 3.5–5.1)
SODIUM: 140 meq/L (ref 136–145)
TOTAL PROTEIN: 6.9 g/dL (ref 6.4–8.3)

## 2016-11-14 LAB — CBC WITH DIFFERENTIAL/PLATELET
BASO%: 0.7 % (ref 0.0–2.0)
BASOS ABS: 0 10*3/uL (ref 0.0–0.1)
EOS ABS: 0.1 10*3/uL (ref 0.0–0.5)
EOS%: 2.8 % (ref 0.0–7.0)
HCT: 37.8 % — ABNORMAL LOW (ref 38.4–49.9)
HGB: 12.7 g/dL — ABNORMAL LOW (ref 13.0–17.1)
LYMPH%: 8.2 % — ABNORMAL LOW (ref 14.0–49.0)
MCH: 29.2 pg (ref 27.2–33.4)
MCHC: 33.7 g/dL (ref 32.0–36.0)
MCV: 86.9 fL (ref 79.3–98.0)
MONO#: 0.4 10*3/uL (ref 0.1–0.9)
MONO%: 10.4 % (ref 0.0–14.0)
NEUT%: 77.9 % — ABNORMAL HIGH (ref 39.0–75.0)
NEUTROS ABS: 2.8 10*3/uL (ref 1.5–6.5)
Platelets: 165 10*3/uL (ref 140–400)
RBC: 4.35 10*6/uL (ref 4.20–5.82)
RDW: 14.6 % (ref 11.0–14.6)
WBC: 3.6 10*3/uL — ABNORMAL LOW (ref 4.0–10.3)
lymph#: 0.3 10*3/uL — ABNORMAL LOW (ref 0.9–3.3)

## 2016-11-14 MED ORDER — SODIUM CHLORIDE 0.9 % IV SOLN
20.0000 mg | Freq: Once | INTRAVENOUS | Status: AC
  Administered 2016-11-14: 20 mg via INTRAVENOUS
  Filled 2016-11-14: qty 2

## 2016-11-14 MED ORDER — PACLITAXEL PROTEIN-BOUND CHEMO INJECTION 100 MG
50.0000 mg/m2 | Freq: Once | INTRAVENOUS | Status: AC
  Administered 2016-11-14: 75 mg via INTRAVENOUS
  Filled 2016-11-14: qty 15

## 2016-11-14 MED ORDER — SODIUM CHLORIDE 0.9 % IV SOLN
242.2000 mg | Freq: Once | INTRAVENOUS | Status: AC
  Administered 2016-11-14: 240 mg via INTRAVENOUS
  Filled 2016-11-14: qty 24

## 2016-11-14 MED ORDER — SODIUM CHLORIDE 0.9 % IV SOLN
Freq: Once | INTRAVENOUS | Status: AC
  Administered 2016-11-14: 11:00:00 via INTRAVENOUS

## 2016-11-14 MED ORDER — HEPARIN SOD (PORK) LOCK FLUSH 100 UNIT/ML IV SOLN
500.0000 [IU] | Freq: Once | INTRAVENOUS | Status: AC | PRN
  Administered 2016-11-14: 500 [IU]
  Filled 2016-11-14: qty 5

## 2016-11-14 MED ORDER — PALONOSETRON HCL INJECTION 0.25 MG/5ML
0.2500 mg | Freq: Once | INTRAVENOUS | Status: AC
  Administered 2016-11-14: 0.25 mg via INTRAVENOUS

## 2016-11-14 MED ORDER — SODIUM CHLORIDE 0.9% FLUSH
10.0000 mL | INTRAVENOUS | Status: DC | PRN
  Administered 2016-11-14: 10 mL
  Filled 2016-11-14: qty 10

## 2016-11-14 MED ORDER — PALONOSETRON HCL INJECTION 0.25 MG/5ML
INTRAVENOUS | Status: AC
  Filled 2016-11-14: qty 5

## 2016-11-14 NOTE — Patient Instructions (Signed)
[facility] Discharge Instructions for Patients Receiving Chemotherapy  Today you received the following chemotherapy agents: Abraxane and Carboplatin.  To help prevent nausea and vomiting after your treatment, we encourage you to take your nausea medication: Compazine. Take one every 6 hours as needed. For nausea on or after 11/03/16, you may take Zofran every 12 hours as needed.  If you develop nausea and vomiting that is not controlled by your nausea medication, call the clinic.   BELOW ARE SYMPTOMS THAT SHOULD BE REPORTED IMMEDIATELY:  *FEVER GREATER THAN 100.5 F  *CHILLS WITH OR WITHOUT FEVER  NAUSEA AND VOMITING THAT IS NOT CONTROLLED WITH YOUR NAUSEA MEDICATION  *UNUSUAL SHORTNESS OF BREATH  *UNUSUAL BRUISING OR BLEEDING  TENDERNESS IN MOUTH AND THROAT WITH OR WITHOUT PRESENCE OF ULCERS  *URINARY PROBLEMS  *BOWEL PROBLEMS  UNUSUAL RASH Items with * indicate a potential emergency and should be followed up as soon as possible.  Feel free to call the clinic should you have any questions or concerns. The clinic phone number is [phone].  Please show the [facility] at check-in to the Emergency Department and triage nurse.  Nanoparticle Albumin-Bound Paclitaxel injection What is this medicine? NANOPARTICLE ALBUMIN-BOUND PACLITAXEL (Na no PAHR ti kuhl al BYOO muhn-bound PAK li TAX el) is a chemotherapy drug. It targets fast dividing cells, like cancer cells, and causes these cells to die. This medicine is used to treat advanced breast cancer and advanced lung cancer. This medicine may be used for other purposes; ask your health care provider or pharmacist if you have questions. COMMON BRAND NAME(S): Abraxane What should I tell my health care provider before I take this medicine? They need to know if you have any of these conditions: -kidney disease -liver disease -low blood counts, like low platelets, red blood cells, or white blood cells -recent or  ongoing radiation therapy -an unusual or allergic reaction to paclitaxel, albumin, other chemotherapy, other medicines, foods, dyes, or preservatives -pregnant or trying to get pregnant -breast-feeding How should I use this medicine? This drug is given as an infusion into a vein. It is administered in a hospital or clinic by a specially trained health care professional. Talk to your pediatrician regarding the use of this medicine in children. Special care may be needed. Overdosage: If you think you have taken too much of this medicine contact a poison control center or emergency room at once. NOTE: This medicine is only for you. Do not share this medicine with others. What if I miss a dose? It is important not to miss your dose. Call your doctor or health care professional if you are unable to keep an appointment. What may interact with this medicine? -cyclosporine -diazepam -ketoconazole -medicines to increase blood counts like filgrastim, pegfilgrastim, sargramostim -other chemotherapy drugs like cisplatin, doxorubicin, epirubicin, etoposide, teniposide, vincristine -quinidine -testosterone -vaccines -verapamil Talk to your doctor or health care professional before taking any of these medicines: -acetaminophen -aspirin -ibuprofen -ketoprofen -naproxen This list may not describe all possible interactions. Give your health care provider a list of all the medicines, herbs, non-prescription drugs, or dietary supplements you use. Also tell them if you smoke, drink alcohol, or use illegal drugs. Some items may interact with your medicine. What should I watch for while using this medicine? Your condition will be monitored carefully while you are receiving this medicine. You will need important blood work done while you are taking this medicine. This medicine can cause serious allergic reactions. If you experience  allergic reactions like skin rash, itching or hives, swelling of the face,  lips, or tongue, tell your doctor or health care professional right away. In some cases, you may be given additional medicines to help with side effects. Follow all directions for their use. This drug may make you feel generally unwell. This is not uncommon, as chemotherapy can affect healthy cells as well as cancer cells. Report any side effects. Continue your course of treatment even though you feel ill unless your doctor tells you to stop. Call your doctor or health care professional for advice if you get a fever, chills or sore throat, or other symptoms of a cold or flu. Do not treat yourself. This drug decreases your body's ability to fight infections. Try to avoid being around people who are sick. This medicine may increase your risk to bruise or bleed. Call your doctor or health care professional if you notice any unusual bleeding. Be careful brushing and flossing your teeth or using a toothpick because you may get an infection or bleed more easily. If you have any dental work done, tell your dentist you are receiving this medicine. Avoid taking products that contain aspirin, acetaminophen, ibuprofen, naproxen, or ketoprofen unless instructed by your doctor. These medicines may hide a fever. Do not become pregnant while taking this medicine. Women should inform their doctor if they wish to become pregnant or think they might be pregnant. There is a potential for serious side effects to an unborn child. Talk to your health care professional or pharmacist for more information. Do not breast-feed an infant while taking this medicine. Men are advised not to father a child while receiving this medicine. What side effects may I notice from receiving this medicine? Side effects that you should report to your doctor or health care professional as soon as possible: -allergic reactions like skin rash, itching or hives, swelling of the face, lips, or tongue -low blood counts - This drug may decrease the  number of white blood cells, red blood cells and platelets. You may be at increased risk for infections and bleeding. -signs of infection - fever or chills, cough, sore throat, pain or difficulty passing urine -signs of decreased platelets or bleeding - bruising, pinpoint red spots on the skin, black, tarry stools, nosebleeds -signs of decreased red blood cells - unusually weak or tired, fainting spells, lightheadedness -breathing problems -changes in vision -chest pain -high or low blood pressure -mouth sores -nausea and vomiting -pain, swelling, redness or irritation at the injection site -pain, tingling, numbness in the hands or feet -slow or irregular heartbeat -swelling of the ankle, feet, hands Side effects that usually do not require medical attention (report to your doctor or health care professional if they continue or are bothersome): -aches, pains -changes in the color of fingernails -diarrhea -hair loss -loss of appetite This list may not describe all possible side effects. Call your doctor for medical advice about side effects. You may report side effects to FDA at 1-800-FDA-1088. Where should I keep my medicine? This drug is given in a hospital or clinic and will not be stored at home. NOTE: This sheet is a summary. It may not cover all possible information. If you have questions about this medicine, talk to your doctor, pharmacist, or health care provider.  2018 Elsevier/Gold Standard (2015-05-19 10:05:20)  Carboplatin injection What is this medicine? CARBOPLATIN (KAR boe pla tin) is a chemotherapy drug. It targets fast dividing cells, like cancer cells, and causes these cells to  die. This medicine is used to treat ovarian cancer and many other cancers. This medicine may be used for other purposes; ask your health care provider or pharmacist if you have questions. COMMON BRAND NAME(S): Paraplatin What should I tell my health care provider before I take this  medicine? They need to know if you have any of these conditions: -blood disorders -hearing problems -kidney disease -recent or ongoing radiation therapy -an unusual or allergic reaction to carboplatin, cisplatin, other chemotherapy, other medicines, foods, dyes, or preservatives -pregnant or trying to get pregnant -breast-feeding How should I use this medicine? This drug is usually given as an infusion into a vein. It is administered in a hospital or clinic by a specially trained health care professional. Talk to your pediatrician regarding the use of this medicine in children. Special care may be needed. Overdosage: If you think you have taken too much of this medicine contact a poison control center or emergency room at once. NOTE: This medicine is only for you. Do not share this medicine with others. What if I miss a dose? It is important not to miss a dose. Call your doctor or health care professional if you are unable to keep an appointment. What may interact with this medicine? -medicines for seizures -medicines to increase blood counts like filgrastim, pegfilgrastim, sargramostim -some antibiotics like amikacin, gentamicin, neomycin, streptomycin, tobramycin -vaccines Talk to your doctor or health care professional before taking any of these medicines: -acetaminophen -aspirin -ibuprofen -ketoprofen -naproxen This list may not describe all possible interactions. Give your health care provider a list of all the medicines, herbs, non-prescription drugs, or dietary supplements you use. Also tell them if you smoke, drink alcohol, or use illegal drugs. Some items may interact with your medicine. What should I watch for while using this medicine? Your condition will be monitored carefully while you are receiving this medicine. You will need important blood work done while you are taking this medicine. This drug may make you feel generally unwell. This is not uncommon, as chemotherapy  can affect healthy cells as well as cancer cells. Report any side effects. Continue your course of treatment even though you feel ill unless your doctor tells you to stop. In some cases, you may be given additional medicines to help with side effects. Follow all directions for their use. Call your doctor or health care professional for advice if you get a fever, chills or sore throat, or other symptoms of a cold or flu. Do not treat yourself. This drug decreases your body's ability to fight infections. Try to avoid being around people who are sick. This medicine may increase your risk to bruise or bleed. Call your doctor or health care professional if you notice any unusual bleeding. Be careful brushing and flossing your teeth or using a toothpick because you may get an infection or bleed more easily. If you have any dental work done, tell your dentist you are receiving this medicine. Avoid taking products that contain aspirin, acetaminophen, ibuprofen, naproxen, or ketoprofen unless instructed by your doctor. These medicines may hide a fever. Do not become pregnant while taking this medicine. Women should inform their doctor if they wish to become pregnant or think they might be pregnant. There is a potential for serious side effects to an unborn child. Talk to your health care professional or pharmacist for more information. Do not breast-feed an infant while taking this medicine. What side effects may I notice from receiving this medicine? Side effects that you  should report to your doctor or health care professional as soon as possible: -allergic reactions like skin rash, itching or hives, swelling of the face, lips, or tongue -signs of infection - fever or chills, cough, sore throat, pain or difficulty passing urine -signs of decreased platelets or bleeding - bruising, pinpoint red spots on the skin, black, tarry stools, nosebleeds -signs of decreased red blood cells - unusually weak or tired,  fainting spells, lightheadedness -breathing problems -changes in hearing -changes in vision -chest pain -high blood pressure -low blood counts - This drug may decrease the number of white blood cells, red blood cells and platelets. You may be at increased risk for infections and bleeding. -nausea and vomiting -pain, swelling, redness or irritation at the injection site -pain, tingling, numbness in the hands or feet -problems with balance, talking, walking -trouble passing urine or change in the amount of urine Side effects that usually do not require medical attention (report to your doctor or health care professional if they continue or are bothersome): -hair loss -loss of appetite -metallic taste in the mouth or changes in taste This list may not describe all possible side effects. Call your doctor for medical advice about side effects. You may report side effects to FDA at 1-800-FDA-1088. Where should I keep my medicine? This drug is given in a hospital or clinic and will not be stored at home. NOTE: This sheet is a summary. It may not cover all possible information. If you have questions about this medicine, talk to your doctor, pharmacist, or health care provider.  2018 Elsevier/Gold Standard (2007-10-22 14:38:05)

## 2016-11-15 ENCOUNTER — Ambulatory Visit
Admission: RE | Admit: 2016-11-15 | Discharge: 2016-11-15 | Disposition: A | Discharge status: Home / Self Care | Payer: Medicaid Other | Source: Ambulatory Visit | Attending: Radiation Oncology | Admitting: Radiation Oncology

## 2016-11-15 DIAGNOSIS — Z510 Encounter for antineoplastic radiation therapy: Secondary | ICD-10-CM | POA: Diagnosis not present

## 2016-11-16 ENCOUNTER — Ambulatory Visit
Admission: RE | Admit: 2016-11-16 | Discharge: 2016-11-16 | Disposition: A | Discharge status: Home / Self Care | Payer: Medicaid Other | Source: Ambulatory Visit | Attending: Radiation Oncology | Admitting: Radiation Oncology

## 2016-11-16 DIAGNOSIS — Z510 Encounter for antineoplastic radiation therapy: Secondary | ICD-10-CM | POA: Diagnosis not present

## 2016-11-16 NOTE — Progress Notes (Signed)
[place]  [phone]) [phone] Fax:(336) [phone]  Clinic Follow-up Note   Patient Care Team: [name], MD as PCP - General (Family Medicine) 11/20/2016   CHIEF COMPLAINTS/PURPOSE OF CONSULTATION:  Esophageal adenocarcinoma   Oncology History   Cancer Staging Esophageal cancer [facility]) Staging form: Esophagus - Adenocarcinoma, AJCC 8th Edition - Clinical stage from 10/26/2016: Stage III (cT3, cN1, cM0) - Signed by [name], MD on 11/05/2016       Malignant neoplasm of lower third of esophagus ([place])   09/21/2016 - 09/21/2016 Hospital Admission    esophageal pain and vomiting up blood      10/09/2016 Initial Diagnosis    Esophageal cancer ([place])      10/09/2016 Procedure    EGD 1. Partially obstructing, likely malignant esophageal tumor was found in the lower third of the esophagus. Multiple biopsies.  2. Mass visible during gastric retroflexion at GE junction 3. Otherwise normal stomach 4. Normal examined duodenum       10/09/2016 Pathology Results    Esophagus, biopsy, distal esophageal tumor (33-39) - SUSPICIOUS FOR ADENOCARCINOMA      10/12/2016 Imaging    CT CAP w Contrast IMPRESSION: Distal esophageal mass compatible with primary esophageal malignancy. There are 2 adjacent abnormal appearing subcentimeter paraesophageal lymph nodes which may represent nodal metastasis. Additionally there is a prominent nonspecific 8 mm upper abdominal lymph node.  No evidence for distant metastatic disease in the chest, abdomen or pelvis.      10/24/2016 PET scan    1. Markedly hypermetabolic distal esophageal lesion, compatible with malignancy. Adjacent small paraesophageal lymph nodes are abnormal by CT but cannot be resolved as separate structures from the hypermetabolic esophageal activity on the PET images. No hypermetabolism is demonstrated in the upper abdominal/gastrohepatic ligament lymph node although the small size of this lymph node  may be below threshold for detection on PET imaging. 2. No evidence for distant hypermetabolic metastatic disease in the neck, chest, abdomen, or pelvis.      10/26/2016 Pathology Results    Esophagogastric junction, biopsy, mass - INVASIVE ADENOCARCINOMA.      10/26/2016 Procedure    EUS showed uT3N1 disease, and biopsy confirmed adenocarcinoma       10/30/2016 -  Radiation Therapy    Neoadjuvant radiation to esophageal cancer        10/31/2016 -  Neo-Adjuvant Chemotherapy    Weekly Carboplatin AUC 2 and taxol 50mg /m2 with concurrent radiation        HISTORY OF PRESENTING ILLNESS (10/16/2016):  Darrell Cunningham 56 y.o. male is here because of an esophageal mass that is suspicious for adenocarcinoma. The pt presented to the ED on 09/21/2016 for esophageal pain and vomiting up blood. He had been struggling with regurgitating food or drinks, difficulty swallowing, and throat pain for 3 weeks prior. He was referred by his PCP to GI who ordered him an EGD with dilation with Dr. [name] as soon as possible. The EGD and biopsy was performed on 10/09/2016, which showed suspicion of adenocarcinoma of the esophagus and a CT scan was ordered. The CT results demonstrated a distal esophageal mass compatible with primary esophageal malignancy. He is here to discuss treatment.   When he has trouble swallowing, he has to stand up and push his head back to get the food down. He has mostly eating oatmeal, Ensure, and other soft foods. He drinks 2-3 Ensure a day. He has cut out coffee and acidic foods to try to relieve his pain. It  is not getting better, though. He isn't able to eat much due to this pain and difficulty swallowing. He does not take any pain medication. He has lost about 13 pounds. He also has some left sided abdominal pain. At night, he has a lot of acid reflux and sputum production. Denies constipation, diarrhea, nausea, SOB, or any other concerns.   He has a history of cerebral palsy on his  right side. He has some back pain due to a curvature in his spine. History of asthma. His grandmother had breast cancer and colon cancer. His sister passed of breast cancer at age 60. His aunt had ovarian cancer. No other family history of cancer. He quit smoking 4 years ago. 38 pack years, 1-1.5 ppd. He lives at home with his 32 y.o. Daughter. His parents live about 15 minutes away.   CURRENT THERAPY: Concurrent chemo and radiation, with weekly Carboplatin AUC 2 and taxol 50mg /m2   INTERVAL HISTORY:  The patient returns for follow up. He reports he is doing well. He reports some mild pain, and a "raw" feeling in his throat when swallowing. He is taking Carafate as needed to alleviate this discomfort. He is taking nausea medication as needed. The patient denies any concerns with chemotherapy.    MEDICAL HISTORY:  Past Medical History:  Diagnosis Date  . Allergy   . Arthritis   . Asthma    as a child  . 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 Dysphagia   . Encounter for nonprocreative genetic counseling 10/31/2016   [name] underwent genetic counseling for hereditary cancer syndromes on 10/31/2016. Though he is a candidate for genetic testing, he declines at this time.  . Hyperlipidemia   . Neuromuscular disorder ([place])   . Pneumonia 4 yrs ago    SURGICAL HISTORY: Past Surgical History:  Procedure Laterality Date  . EUS N/A 10/26/2016   Procedure: UPPER ENDOSCOPIC ULTRASOUND (EUS) LINEAR;  Surgeon: [name], MD;  Location: [facility] ENDOSCOPY;  Service: Endoscopy;  Laterality: N/A;  . EYE SURGERY Bilateral age 81    for cross eyes  . IR FLUORO GUIDE PORT INSERTION RIGHT  11/08/2016  . IR US GUIDE VASC ACCESS RIGHT  11/08/2016  . MOUTH SURGERY      SOCIAL HISTORY: Social History   Social History  . Marital status: Legally Separated    Spouse name: N/A  . Number of children: N/A  . Years of education: N/A   Occupational History  . Not on file.   Social History  Main Topics  . Smoking status: Former Smoker    Packs/day: 1.00    Years: 38.00    Types: Cigarettes    Start date: 10/31/2012    Quit date: 10/31/2012  . Smokeless tobacco: Never Used  . Alcohol use No  . Drug use: No  . Sexual activity: Not on file   Other Topics Concern  . Not on file   Social History Narrative  . No narrative on file    FAMILY HISTORY: Family History  Problem Relation Age of Onset  . Colon cancer Maternal Grandmother 74  . Breast cancer Maternal Grandmother 5  . Breast cancer Sister 63    Deceased at 39 of breast cancer  . Ovarian cancer Maternal Aunt   . Stomach cancer Neg Hx   . Esophageal cancer Neg Hx     ALLERGIES:  is allergic to penicillins.  MEDICATIONS:  Current Outpatient Prescriptions  Medication Sig Dispense Refill  .  emollient (BIAFINE) cream Apply 1 application topically daily.    . fluticasone (FLONASE) 50 MCG/ACT nasal spray Place 1 spray into both nostrils daily as needed for allergies or rhinitis.    Marland Kitchen ibuprofen (ADVIL,MOTRIN) 200 MG tablet Take 800 mg by mouth daily as needed for headache or moderate pain.     Marland Kitchen lidocaine-prilocaine (EMLA) cream Apply 1 application topically as needed. Apply to portacath  1 1/2 hours - 2 hours prior to procedures as needed. 30 g 1  . morphine (ROXANOL) 20 MG/ML concentrated solution Take 0.25-0.5 mLs (5-10 mg total) by mouth every 6 (six) hours as needed for severe pain. 30 mL 0  . ondansetron (ZOFRAN) 8 MG tablet Take 1 tablet (8 mg total) by mouth 2 (two) times daily as needed for refractory nausea / vomiting. Start on day 3 after chemo. 30 tablet 1  . pantoprazole (PROTONIX) 40 MG tablet Take 1 tablet (40 mg total) by mouth 2 (two) times daily. 60 tablet 3  . PROAIR HFA 108 (90 Base) MCG/ACT inhaler INHALE 2 PUFFS INTO THE LUNGS EVERY 6 (SIX) HOURS AS NEEDED FOR WHEEZING OR SHORTNESS OF BREATH. 18 g 0  . prochlorperazine (COMPAZINE) 10 MG tablet Take 1 tablet (10 mg total) by mouth every 6 (six)  hours as needed (Nausea or vomiting). 30 tablet 1  . sucralfate (CARAFATE) 1 GM/10ML suspension Take 10 mLs (1 g total) by mouth 4 (four) times daily -  with meals and at bedtime. 420 mL 2  . tiZANidine (ZANAFLEX) 4 MG tablet TAKE 1 TABLET (4 MG TOTAL) BY MOUTH EVERY 6 (SIX) HOURS AS NEEDED FOR MUSCLE SPASMS. 30 tablet 0   No current facility-administered medications for this visit.    REVIEW OF SYSTEMS:   Constitutional: Denies fevers, chills or abnormal night sweats (+) weight loss Eyes: Denies blurriness of vision, double vision or watery eyes Ears, nose, mouth, throat, and face: Denies mucositis or sore throat (+) throat pain (+) trouble swallowing solids Respiratory: Denies cough, dyspnea or wheezes  Cardiovascular: Denies palpitation, chest discomfort or lower extremity swelling Gastrointestinal:  Denies nausea, heartburn or change in bowel habits (+) abdominal pain (+) constipation  Skin: Denies abnormal skin rashes (+) right arm red and swollen (+) itching  Lymphatics: Denies new lymphadenopathy or easy bruising Neurological:Denies numbness, tingling or new weaknesses  Musculoskeletal: Negative Behavioral/Psych: Mood is stable, no new changes (+) insomnia  All other systems were reviewed with the patient and are negative.  PHYSICAL EXAMINATION: ECOG PERFORMANCE STATUS: 1 - Symptomatic but completely ambulatory  Vitals:   11/20/16 1041  BP: 125/80  Pulse: 91  Resp: 18  Temp: 98.4 F (36.9 C)   Filed Weights   11/20/16 1041  Weight: 142 lb 8 oz (64.6 kg)   GENERAL:alert, no distress and comfortable (+) limp from right sided cerebral palsy (+) weight loss of about 2 lbs since 10/19/16 SKIN: skin color, texture, turgor are normal, no rashes or significant lesions EYES: normal, conjunctiva are pink and non-injected, sclera clear OROPHARYNX:no exudate, no erythema and lips, buccal mucosa, and tongue normal  NECK: supple, thyroid normal size, non-tender, without  nodularity LYMPH:  no palpable lymphadenopathy in the cervical, axillary or inguinal LUNGS: clear to auscultation and percussion with normal breathing effort (+) wheezing right lung HEART: regular rate & rhythm and no murmurs and no lower extremity edema ABDOMEN:abdomen soft, non-tender and normal bowel sounds (+) tenderness  Musculoskeletal:no cyanosis of digits and no clubbing  PSYCH: alert & oriented x 3 with  fluent speech NEURO: no focal motor/sensory deficits  LABORATORY DATA:  I have reviewed the data as listed CBC Latest Ref Rng & Units 11/20/2016 11/14/2016 11/08/2016  WBC 4.0 - 10.3 10e3/uL 4.1 3.6(L) 5.3  Hemoglobin 13.0 - 17.1 g/dL 13.3 12.7(L) 12.3(L)  Hematocrit 38.4 - 49.9 % 39.6 37.8(L) 36.1(L)  Platelets 140 - 400 10e3/uL 144 165 270   CMP Latest Ref Rng & Units 11/20/2016 11/14/2016 11/08/2016  Glucose 70 - 140 mg/dl 100 104 109(H)  BUN 7.0 - 26.0 mg/dL 11.8 9.8 14  Creatinine 0.7 - 1.3 mg/dL 0.7 0.7 0.52(L)  Sodium 136 - 145 mEq/L 141 140 135  Potassium 3.5 - 5.1 mEq/L 4.4 4.5 4.5  Chloride 101 - 111 mmol/L - - 104  CO2 22 - 29 mEq/L 26 25 24   Calcium 8.4 - 10.4 mg/dL 9.7 9.0 9.5  Total Protein 6.4 - 8.3 g/dL 7.2 6.9 -  Total Bilirubin 0.20 - 1.20 mg/dL 0.30 <0.22 -  Alkaline Phos 40 - 150 U/L 72 74 -  AST 5 - 34 U/L 14 14 -  ALT 0 - 55 U/L 27 27 -   PATHOLOGY:  Diagnosis 10/26/2016 Esophagogastric junction, biopsy, mass - INVASIVE ADENOCARCINOMA.  Diagnosis 10/09/2016 Esophagus, biopsy, distal esophageal tumor (33-39) - SUSPICIOUS FOR ADENOCARCINOMA, SEE COMMENT.Marland Kitchen  RADIOGRAPHIC STUDIES: I have personally reviewed the radiological images as listed and agreed with the findings in the report.  PET 10/24/2016 IMPRESSION: 1. Markedly hypermetabolic distal esophageal lesion, compatible with malignancy. Adjacent small paraesophageal lymph nodes are abnormal by CT but cannot be resolved as separate structures from the hypermetabolic esophageal activity on the PET  images. No hypermetabolism is demonstrated in the upper abdominal/gastrohepatic ligament lymph node although the small size of this lymph node may be below threshold for detection on PET imaging. 2. No evidence for distant hypermetabolic metastatic disease in the neck, chest, abdomen, or pelvis.  CT CAP w Contrast 10/12/2016 IMPRESSION: Distal esophageal mass compatible with primary esophageal malignancy. There are 2 adjacent abnormal appearing subcentimeter paraesophageal lymph nodes which may represent nodal metastasis. Additionally there is a prominent nonspecific 8 mm upper abdominal lymph node.  No evidence for distant metastatic disease in the chest, abdomen or pelvis.  EGD 10/09/2016   ASSESSMENT & PLAN:  Darrell Cunningham is a 56 y.o. Caucasian male with PMH cerebral palsy and heavy smoking, who presents with dysphagia, odynophagia and weight loss   1. Low esophageal Adenocarcinoma, cT3N1M0, stage III -I previously reviewed the imaging and biopsy pathology results with the patient in detail. Although his biopsy pathology was not definitive for malignancy, based on his endoscopy findings, this is most consistent with esophageal adenocarcinoma. -I previously reviewed his PET scan images with patient in person, which showed intense hypermetabolic esophageal cancer, no other distant metastasis -I reviewed the EUS and re-biopsy results, which showed T3N1 disease, and biopsy confirmed adenocarcinoma -We previously reviewed the standard care for locally advanced esophageal cancer, including neoadjuvant chemoradiation, followed by esophagectomy.  -He was previously referred to see thoracic surgeon Dr. Pia Mau. -He has started concurrent chemoradiation with weekly carbo and Taxol, tolerating well overall  -his dysphagia has slightly improved, weight is stable  -I encouraged him to drink plenty of water, eat soft foods, and eat slowly. Follow up with dietitian  -Continue using zofran and  compazine as needed  - Labs reviewed, adequate for treatment. Proceed with treatment this week as scheduled.  -continue lab weekly   2. Dysphagia, odynophagia and weight loss -Difficulty eating due to esophageal pain -  He can eat soft foods, which occasionally get stuck -Drinks 3 Ensure a day. I encouraged 4-5 a day.  -He will f/u with our dietician -His symptoms has slightly improved since he started the chemoradiation, he is tolerating treatment well.  3. History of heavy smoking -He has quit smoking completely, history of 40 pack year.   4. Constipation -I previously encouraged the use of MiraLax daily or more if needed  -This may get worse due to morphine use   Plan: - Labs reviewed, adequate for treatment. Proceed with chemo treatment this week as scheduled. -continue lab and chemo weekly -f/u in 2 weeks   All questions were answered. The patient knows to call the clinic with any problems, questions or concerns.  I spent 15 minutes counseling the patient face to face. The total time spent in the appointment was 20 minutes and more than 50% was on counseling.  This document serves as a record of services personally performed by [name], MD. It was created on her behalf by Maryla Morrow, a trained medical scribe. The creation of this record is based on the scribe's personal observations and the provider's statements to them. This document has been checked and approved by the attending provider.  I have reviewed the above documentation for accuracy and completeness and I agree with the above.   [name], MD 11/20/2016

## 2016-11-17 ENCOUNTER — Ambulatory Visit
Admission: RE | Admit: 2016-11-17 | Discharge: 2016-11-17 | Disposition: A | Discharge status: Home / Self Care | Payer: Medicaid Other | Source: Ambulatory Visit | Attending: Radiation Oncology | Admitting: Radiation Oncology

## 2016-11-17 DIAGNOSIS — Z510 Encounter for antineoplastic radiation therapy: Secondary | ICD-10-CM | POA: Diagnosis not present

## 2016-11-17 NOTE — Progress Notes (Signed)
  Radiation Oncology         [phone] ________________________________  Name: Darrell Cunningham MRN: [id number]  Date: 10/23/2016  DOB: [birth year]  SIMULATION AND TREATMENT PLANNING NOTE  DIAGNOSIS:     ICD-9-CM ICD-10-CM   1. Malignant neoplasm of lower third of esophagus ([facility]) 150.5 C15.5      Site:  Lower esophagus  NARRATIVE:  The patient was brought to the [facility].  Identity was confirmed.  All relevant records and images related to the planned course of therapy were reviewed.   Written consent to proceed with treatment was confirmed which was freely given after reviewing the details related to the planned course of therapy had been reviewed with the patient.  Then, the patient was set-up in a stable reproducible  supine position for radiation therapy.  CT images were obtained.  Surface markings were placed.    Medically necessary complex treatment device(s) for immobilization:  Customized vac lock bag.   The CT images were loaded into the planning software.  Then the target and avoidance structures were contoured.  Treatment planning then occurred.   I have requested : 3D Simulation  I have requested a DVH of the following structures: Target volume, heart, spinal cord, lungs. The patient will be treated on our tomotherapy unit using a 3-D conformal technique. The number of treatment fields/customized treatment devices will be determined based on the patient's planning sinogram.   The patient will undergo daily image guidance to ensure accurate localization of the target, and adequate minimize dose to the normal surrounding structures in close proximity to the target.   PLAN:  The patient will receive 45 Gy in 25 fractions initially. It is anticipated that the patient will then receive a 5.4 gray boost for a final total dose of 50.4 gray.  Special treatment procedure The patient will also receive concurrent chemotherapy during the treatment. The patient may  therefore experience increased toxicity or side effects and the patient will be monitored for such problems. This may require extra lab work as necessary. This therefore constitutes a special treatment procedure.   ________________________________   Jodelle Gross, MD, PhD

## 2016-11-20 ENCOUNTER — Ambulatory Visit
Admission: RE | Admit: 2016-11-20 | Discharge: 2016-11-20 | Disposition: A | Discharge status: Home / Self Care | Payer: Medicaid Other | Source: Ambulatory Visit | Attending: Radiation Oncology | Admitting: Radiation Oncology

## 2016-11-20 ENCOUNTER — Ambulatory Visit (HOSPITAL_BASED_OUTPATIENT_CLINIC_OR_DEPARTMENT_OTHER): Payer: Medicaid Other | Admitting: Hematology

## 2016-11-20 ENCOUNTER — Telehealth: Payer: Self-pay | Admitting: Hematology

## 2016-11-20 ENCOUNTER — Other Ambulatory Visit (HOSPITAL_BASED_OUTPATIENT_CLINIC_OR_DEPARTMENT_OTHER): Payer: Medicaid Other

## 2016-11-20 ENCOUNTER — Encounter: Payer: Self-pay | Admitting: Hematology

## 2016-11-20 VITALS — BP 125/80 | HR 91 | Temp 98.4°F | Resp 18 | Ht 61.0 in | Wt 142.5 lb

## 2016-11-20 DIAGNOSIS — C155 Malignant neoplasm of lower third of esophagus: Secondary | ICD-10-CM

## 2016-11-20 DIAGNOSIS — Z510 Encounter for antineoplastic radiation therapy: Secondary | ICD-10-CM | POA: Diagnosis not present

## 2016-11-20 DIAGNOSIS — K5900 Constipation, unspecified: Secondary | ICD-10-CM | POA: Diagnosis not present

## 2016-11-20 DIAGNOSIS — Z87891 Personal history of nicotine dependence: Secondary | ICD-10-CM | POA: Diagnosis not present

## 2016-11-20 DIAGNOSIS — R1310 Dysphagia, unspecified: Secondary | ICD-10-CM

## 2016-11-20 DIAGNOSIS — J439 Emphysema, unspecified: Secondary | ICD-10-CM

## 2016-11-20 LAB — COMPREHENSIVE METABOLIC PANEL
ALT: 27 U/L (ref 0–55)
AST: 14 U/L (ref 5–34)
Albumin: 3.5 g/dL (ref 3.5–5.0)
Alkaline Phosphatase: 72 U/L (ref 40–150)
Anion Gap: 10 mEq/L (ref 3–11)
BUN: 11.8 mg/dL (ref 7.0–26.0)
CHLORIDE: 104 meq/L (ref 98–109)
CO2: 26 meq/L (ref 22–29)
Calcium: 9.7 mg/dL (ref 8.4–10.4)
Creatinine: 0.7 mg/dL (ref 0.7–1.3)
GLUCOSE: 100 mg/dL (ref 70–140)
Potassium: 4.4 mEq/L (ref 3.5–5.1)
SODIUM: 141 meq/L (ref 136–145)
Total Bilirubin: 0.3 mg/dL (ref 0.20–1.20)
Total Protein: 7.2 g/dL (ref 6.4–8.3)

## 2016-11-20 LAB — CBC WITH DIFFERENTIAL/PLATELET
BASO%: 0.7 % (ref 0.0–2.0)
BASOS ABS: 0 10*3/uL (ref 0.0–0.1)
EOS ABS: 0 10*3/uL (ref 0.0–0.5)
EOS%: 1 % (ref 0.0–7.0)
HCT: 39.6 % (ref 38.4–49.9)
HGB: 13.3 g/dL (ref 13.0–17.1)
LYMPH%: 6.1 % — AB (ref 14.0–49.0)
MCH: 29.3 pg (ref 27.2–33.4)
MCHC: 33.6 g/dL (ref 32.0–36.0)
MCV: 87.2 fL (ref 79.3–98.0)
MONO#: 0.4 10*3/uL (ref 0.1–0.9)
MONO%: 8.8 % (ref 0.0–14.0)
NEUT#: 3.4 10*3/uL (ref 1.5–6.5)
NEUT%: 83.4 % — ABNORMAL HIGH (ref 39.0–75.0)
Platelets: 144 10*3/uL (ref 140–400)
RBC: 4.54 10*6/uL (ref 4.20–5.82)
RDW: 14.5 % (ref 11.0–14.6)
WBC: 4.1 10*3/uL (ref 4.0–10.3)
lymph#: 0.3 10*3/uL — ABNORMAL LOW (ref 0.9–3.3)

## 2016-11-20 NOTE — Telephone Encounter (Signed)
Gave patient AVS and calender per 4/23 los.  

## 2016-11-21 ENCOUNTER — Ambulatory Visit (HOSPITAL_BASED_OUTPATIENT_CLINIC_OR_DEPARTMENT_OTHER): Payer: Medicaid Other

## 2016-11-21 ENCOUNTER — Other Ambulatory Visit: Payer: Medicaid Other

## 2016-11-21 ENCOUNTER — Ambulatory Visit
Admission: RE | Admit: 2016-11-21 | Discharge: 2016-11-21 | Disposition: A | Discharge status: Home / Self Care | Payer: Medicaid Other | Source: Ambulatory Visit | Attending: Radiation Oncology | Admitting: Radiation Oncology

## 2016-11-21 ENCOUNTER — Ambulatory Visit: Payer: Medicaid Other

## 2016-11-21 VITALS — BP 121/84 | HR 98 | Temp 98.8°F | Resp 18

## 2016-11-21 DIAGNOSIS — C155 Malignant neoplasm of lower third of esophagus: Secondary | ICD-10-CM | POA: Diagnosis not present

## 2016-11-21 DIAGNOSIS — Z5111 Encounter for antineoplastic chemotherapy: Secondary | ICD-10-CM

## 2016-11-21 DIAGNOSIS — Z510 Encounter for antineoplastic radiation therapy: Secondary | ICD-10-CM | POA: Diagnosis not present

## 2016-11-21 MED ORDER — PALONOSETRON HCL INJECTION 0.25 MG/5ML
INTRAVENOUS | Status: AC
  Filled 2016-11-21: qty 5

## 2016-11-21 MED ORDER — PACLITAXEL PROTEIN-BOUND CHEMO INJECTION 100 MG
50.0000 mg/m2 | Freq: Once | INTRAVENOUS | Status: AC
  Administered 2016-11-21: 75 mg via INTRAVENOUS
  Filled 2016-11-21: qty 15

## 2016-11-21 MED ORDER — HEPARIN SOD (PORK) LOCK FLUSH 100 UNIT/ML IV SOLN
500.0000 [IU] | Freq: Once | INTRAVENOUS | Status: AC | PRN
  Administered 2016-11-21: 500 [IU]
  Filled 2016-11-21: qty 5

## 2016-11-21 MED ORDER — SODIUM CHLORIDE 0.9 % IV SOLN
20.0000 mg | Freq: Once | INTRAVENOUS | Status: AC
  Administered 2016-11-21: 20 mg via INTRAVENOUS
  Filled 2016-11-21: qty 2

## 2016-11-21 MED ORDER — PALONOSETRON HCL INJECTION 0.25 MG/5ML
0.2500 mg | Freq: Once | INTRAVENOUS | Status: AC
Start: 2016-11-21 — End: 2016-11-21
  Administered 2016-11-21: 0.25 mg via INTRAVENOUS

## 2016-11-21 MED ORDER — SODIUM CHLORIDE 0.9 % IV SOLN
242.2000 mg | Freq: Once | INTRAVENOUS | Status: AC
  Administered 2016-11-21: 240 mg via INTRAVENOUS
  Filled 2016-11-21: qty 24

## 2016-11-21 MED ORDER — SODIUM CHLORIDE 0.9 % IV SOLN
Freq: Once | INTRAVENOUS | Status: AC
  Administered 2016-11-21: 12:00:00 via INTRAVENOUS

## 2016-11-21 MED ORDER — SODIUM CHLORIDE 0.9% FLUSH
10.0000 mL | INTRAVENOUS | Status: DC | PRN
  Administered 2016-11-21: 10 mL
  Filled 2016-11-21: qty 10

## 2016-11-21 NOTE — Progress Notes (Signed)
Nutrition Follow-up:  Nutrition follow-up completed during infusion this am.  Patient of Dr. Burr Medico currently receiving chemo and radiation therapy for esophageal cancer.  Patient reports that he has been eating a little bit more solid foods recently.  Had eggs, biscuit and gravy and sausage this am for breakfast.  Reports ate hot dog and fries for dinner last night but had to take his time and chew well.  Reports that he is drinking 2 ensure per day because he is eating more solid foods.    Reports that he is taking carafate to easy burning in throat and esophagus.     Medications: reviewed  Labs: reviewed  Anthropometrics:   Patient weight increased slightly to 142 lb 8 oz today from 141 pounds on 4/9.     NUTRITION DIAGNOSIS: Food and nutrition related knowledged deficit improving   MALNUTRITION DIAGNOSIS: Will continue to assess   INTERVENTION:   Encouraged intake of soft, moist high calorie, high protein foods. Encouraged oral nutrition supplement of at least 350 calories or more.    MONITORING, EVALUATION, GOAL: Patient will tolerate adequate calories and protein to maintain current weight   NEXT VISIT: May 1 during infusion  Amore Grater B. Zenia Resides, [place], [place] Registered Dietitian [phone] (pager)

## 2016-11-21 NOTE — Patient Instructions (Signed)
[facility] Discharge Instructions for Patients Receiving Chemotherapy  Today you received the following chemotherapy agents: Abraxane and Carboplatin.  To help prevent nausea and vomiting after your treatment, we encourage you to take your nausea medication: Compazine. Take one every 6 hours as needed. For nausea on or after 11/03/16, you may take Zofran every 12 hours as needed.  If you develop nausea and vomiting that is not controlled by your nausea medication, call the clinic.   BELOW ARE SYMPTOMS THAT SHOULD BE REPORTED IMMEDIATELY:  *FEVER GREATER THAN 100.5 F  *CHILLS WITH OR WITHOUT FEVER  NAUSEA AND VOMITING THAT IS NOT CONTROLLED WITH YOUR NAUSEA MEDICATION  *UNUSUAL SHORTNESS OF BREATH  *UNUSUAL BRUISING OR BLEEDING  TENDERNESS IN MOUTH AND THROAT WITH OR WITHOUT PRESENCE OF ULCERS  *URINARY PROBLEMS  *BOWEL PROBLEMS  UNUSUAL RASH Items with * indicate a potential emergency and should be followed up as soon as possible.  Feel free to call the clinic should you have any questions or concerns. The clinic phone number is [phone].  Please show the [facility] at check-in to the Emergency Department and triage nurse.

## 2016-11-22 ENCOUNTER — Ambulatory Visit
Admission: RE | Admit: 2016-11-22 | Discharge: 2016-11-22 | Disposition: A | Discharge status: Home / Self Care | Payer: Medicaid Other | Source: Ambulatory Visit | Attending: Radiation Oncology | Admitting: Radiation Oncology

## 2016-11-22 DIAGNOSIS — Z510 Encounter for antineoplastic radiation therapy: Secondary | ICD-10-CM | POA: Diagnosis not present

## 2016-11-23 ENCOUNTER — Ambulatory Visit
Admission: RE | Admit: 2016-11-23 | Discharge: 2016-11-23 | Disposition: A | Discharge status: Home / Self Care | Payer: Medicaid Other | Source: Ambulatory Visit | Attending: Radiation Oncology | Admitting: Radiation Oncology

## 2016-11-23 ENCOUNTER — Other Ambulatory Visit: Payer: Self-pay | Admitting: Hematology

## 2016-11-23 DIAGNOSIS — Z510 Encounter for antineoplastic radiation therapy: Secondary | ICD-10-CM | POA: Diagnosis not present

## 2016-11-24 ENCOUNTER — Ambulatory Visit
Admission: RE | Admit: 2016-11-24 | Discharge: 2016-11-24 | Disposition: A | Discharge status: Home / Self Care | Payer: Medicaid Other | Source: Ambulatory Visit | Attending: Radiation Oncology | Admitting: Radiation Oncology

## 2016-11-24 DIAGNOSIS — Z510 Encounter for antineoplastic radiation therapy: Secondary | ICD-10-CM | POA: Diagnosis not present

## 2016-11-27 ENCOUNTER — Ambulatory Visit
Admission: RE | Admit: 2016-11-27 | Discharge: 2016-11-27 | Disposition: A | Discharge status: Home / Self Care | Payer: Medicaid Other | Source: Ambulatory Visit | Attending: Radiation Oncology | Admitting: Radiation Oncology

## 2016-11-27 DIAGNOSIS — Z510 Encounter for antineoplastic radiation therapy: Secondary | ICD-10-CM | POA: Diagnosis not present

## 2016-11-28 ENCOUNTER — Ambulatory Visit (HOSPITAL_BASED_OUTPATIENT_CLINIC_OR_DEPARTMENT_OTHER): Payer: Medicaid Other

## 2016-11-28 ENCOUNTER — Ambulatory Visit: Payer: Medicaid Other

## 2016-11-28 ENCOUNTER — Other Ambulatory Visit (HOSPITAL_BASED_OUTPATIENT_CLINIC_OR_DEPARTMENT_OTHER): Payer: Medicaid Other

## 2016-11-28 ENCOUNTER — Ambulatory Visit
Admission: RE | Admit: 2016-11-28 | Discharge: 2016-11-28 | Disposition: A | Discharge status: Home / Self Care | Payer: Medicaid Other | Source: Ambulatory Visit | Attending: Radiation Oncology | Admitting: Radiation Oncology

## 2016-11-28 VITALS — BP 110/85 | HR 100 | Temp 97.6°F | Resp 18

## 2016-11-28 DIAGNOSIS — C155 Malignant neoplasm of lower third of esophagus: Secondary | ICD-10-CM

## 2016-11-28 DIAGNOSIS — Z5111 Encounter for antineoplastic chemotherapy: Secondary | ICD-10-CM | POA: Diagnosis present

## 2016-11-28 DIAGNOSIS — Z510 Encounter for antineoplastic radiation therapy: Secondary | ICD-10-CM | POA: Diagnosis not present

## 2016-11-28 LAB — COMPREHENSIVE METABOLIC PANEL
ALBUMIN: 3.4 g/dL — AB (ref 3.5–5.0)
ALK PHOS: 70 U/L (ref 40–150)
ALT: 29 U/L (ref 0–55)
AST: 17 U/L (ref 5–34)
Anion Gap: 9 mEq/L (ref 3–11)
BILIRUBIN TOTAL: 0.38 mg/dL (ref 0.20–1.20)
BUN: 13.6 mg/dL (ref 7.0–26.0)
CALCIUM: 9.6 mg/dL (ref 8.4–10.4)
CO2: 27 mEq/L (ref 22–29)
Chloride: 105 mEq/L (ref 98–109)
Creatinine: 0.8 mg/dL (ref 0.7–1.3)
EGFR: >90 mL/min/{1.73_m2} (ref >90)
Glucose: 139 mg/dl (ref 70–140)
POTASSIUM: 3.9 meq/L (ref 3.5–5.1)
Sodium: 141 mEq/L (ref 136–145)
TOTAL PROTEIN: 7 g/dL (ref 6.4–8.3)

## 2016-11-28 LAB — CBC WITH DIFFERENTIAL/PLATELET
BASO%: 0.4 % (ref 0.0–2.0)
BASOS ABS: 0 10*3/uL (ref 0.0–0.1)
EOS ABS: 0 10*3/uL (ref 0.0–0.5)
EOS%: 0.7 % (ref 0.0–7.0)
HEMATOCRIT: 39.6 % (ref 38.4–49.9)
HEMOGLOBIN: 13.2 g/dL (ref 13.0–17.1)
LYMPH%: 3.7 % — ABNORMAL LOW (ref 14.0–49.0)
MCH: 29.5 pg (ref 27.2–33.4)
MCHC: 33.3 g/dL (ref 32.0–36.0)
MCV: 88.6 fL (ref 79.3–98.0)
MONO#: 0.2 10*3/uL (ref 0.1–0.9)
MONO%: 6.3 % (ref 0.0–14.0)
NEUT%: 88.9 % — ABNORMAL HIGH (ref 39.0–75.0)
NEUTROS ABS: 3 10*3/uL (ref 1.5–6.5)
Platelets: 111 10*3/uL — ABNORMAL LOW (ref 140–400)
RBC: 4.48 10*6/uL (ref 4.20–5.82)
RDW: 15.1 % — ABNORMAL HIGH (ref 11.0–14.6)
WBC: 3.4 10*3/uL — ABNORMAL LOW (ref 4.0–10.3)
lymph#: 0.1 10*3/uL — ABNORMAL LOW (ref 0.9–3.3)

## 2016-11-28 MED ORDER — SODIUM CHLORIDE 0.9% FLUSH
10.0000 mL | INTRAVENOUS | Status: DC | PRN
  Administered 2016-11-28: 10 mL
  Filled 2016-11-28: qty 10

## 2016-11-28 MED ORDER — SODIUM CHLORIDE 0.9 % IV SOLN
Freq: Once | INTRAVENOUS | Status: AC
  Administered 2016-11-28: 11:00:00 via INTRAVENOUS

## 2016-11-28 MED ORDER — HEPARIN SOD (PORK) LOCK FLUSH 100 UNIT/ML IV SOLN
500.0000 [IU] | Freq: Once | INTRAVENOUS | Status: AC | PRN
  Administered 2016-11-28: 500 [IU]
  Filled 2016-11-28: qty 5

## 2016-11-28 MED ORDER — PALONOSETRON HCL INJECTION 0.25 MG/5ML
INTRAVENOUS | Status: AC
  Filled 2016-11-28: qty 5

## 2016-11-28 MED ORDER — PALONOSETRON HCL INJECTION 0.25 MG/5ML
0.2500 mg | Freq: Once | INTRAVENOUS | Status: AC
  Administered 2016-11-28: 0.25 mg via INTRAVENOUS

## 2016-11-28 MED ORDER — SODIUM CHLORIDE 0.9 % IV SOLN
242.2000 mg | Freq: Once | INTRAVENOUS | Status: AC
  Administered 2016-11-28: 240 mg via INTRAVENOUS
  Filled 2016-11-28: qty 24

## 2016-11-28 MED ORDER — SODIUM CHLORIDE 0.9 % IV SOLN
20.0000 mg | Freq: Once | INTRAVENOUS | Status: AC
  Administered 2016-11-28: 20 mg via INTRAVENOUS
  Filled 2016-11-28: qty 2

## 2016-11-28 MED ORDER — PACLITAXEL PROTEIN-BOUND CHEMO INJECTION 100 MG
50.0000 mg/m2 | Freq: Once | INTRAVENOUS | Status: AC
  Administered 2016-11-28: 75 mg via INTRAVENOUS
  Filled 2016-11-28: qty 15

## 2016-11-28 NOTE — Progress Notes (Signed)
Nutrition Follow-up:  Nutrition follow-up completed with patient during infusion this am.  Patient is followed by Dr. Burr Medico for esophageal cancer and currently receiving chemotherapy and radiation.  Patient reports that he is drinking 3 ensure per day. Reports that throat is getting a little sore but taking carafate and morphine for pain. Reports that he ate a whopper the other day after chewing it well.  Reports some slight nausea but has been taking medication and it has helped.   Medications: reviewed  Labs: reviewed  Anthropometrics:   No new weight taken today.     NUTRITION DIAGNOSIS: Food and nutrition related knowledge deficit resolved.   MALNUTRITION DIAGNOSIS: none at this time   INTERVENTION:   Reminded patient of the importance of soft, moist high calorie, high protein foods. Examples provided. Patient verbalized understanding Encouraged continue use of oral nutrition supplements minimum 3 per day but will likely need 4-5 per day pending additional intake    MONITORING, EVALUATION, GOAL: Patient will tolerate adequate calories and protein to maintain current weight   NEXT VISIT: May 8th during infusion  Fady Stamps B. Zenia Resides, [place], [place] Registered Dietitian [phone] (pager)

## 2016-11-28 NOTE — Patient Instructions (Signed)
[facility] Discharge Instructions for Patients Receiving Chemotherapy  Today you received the following chemotherapy agents: Abraxane and Carboplatin.  To help prevent nausea and vomiting after your treatment, we encourage you to take your nausea medication: Compazine. Take one every 6 hours as needed. For nausea on or after 11/03/16, you may take Zofran every 12 hours as needed.  If you develop nausea and vomiting that is not controlled by your nausea medication, call the clinic.   BELOW ARE SYMPTOMS THAT SHOULD BE REPORTED IMMEDIATELY:  *FEVER GREATER THAN 100.5 F  *CHILLS WITH OR WITHOUT FEVER  NAUSEA AND VOMITING THAT IS NOT CONTROLLED WITH YOUR NAUSEA MEDICATION  *UNUSUAL SHORTNESS OF BREATH  *UNUSUAL BRUISING OR BLEEDING  TENDERNESS IN MOUTH AND THROAT WITH OR WITHOUT PRESENCE OF ULCERS  *URINARY PROBLEMS  *BOWEL PROBLEMS  UNUSUAL RASH Items with * indicate a potential emergency and should be followed up as soon as possible.  Feel free to call the clinic should you have any questions or concerns. The clinic phone number is [phone].  Please show the [facility] at check-in to the Emergency Department and triage nurse.

## 2016-11-29 ENCOUNTER — Ambulatory Visit
Admission: RE | Admit: 2016-11-29 | Discharge: 2016-11-29 | Disposition: A | Discharge status: Home / Self Care | Payer: Medicaid Other | Source: Ambulatory Visit | Attending: Radiation Oncology | Admitting: Radiation Oncology

## 2016-11-29 DIAGNOSIS — Z510 Encounter for antineoplastic radiation therapy: Secondary | ICD-10-CM | POA: Diagnosis not present

## 2016-11-30 ENCOUNTER — Ambulatory Visit
Admission: RE | Admit: 2016-11-30 | Discharge: 2016-11-30 | Disposition: A | Discharge status: Home / Self Care | Payer: Medicaid Other | Source: Ambulatory Visit | Attending: Radiation Oncology | Admitting: Radiation Oncology

## 2016-11-30 ENCOUNTER — Encounter: Payer: Self-pay | Admitting: Cardiothoracic Surgery

## 2016-11-30 ENCOUNTER — Ambulatory Visit (INDEPENDENT_AMBULATORY_CARE_PROVIDER_SITE_OTHER): Payer: Medicaid Other | Admitting: Cardiothoracic Surgery

## 2016-11-30 VITALS — BP 125/78 | HR 80 | Resp 20 | Ht 61.0 in | Wt 140.6 lb

## 2016-11-30 DIAGNOSIS — D490 Neoplasm of unspecified behavior of digestive system: Secondary | ICD-10-CM | POA: Diagnosis not present

## 2016-11-30 DIAGNOSIS — Z510 Encounter for antineoplastic radiation therapy: Secondary | ICD-10-CM | POA: Diagnosis not present

## 2016-11-30 NOTE — Progress Notes (Signed)
[address]       [place] [address]             [phone]                    Darrell Cunningham Medical Record #[id number] Date of Birth: [birth year]  Referring: Darrell Bears, MD Primary Care: Darrell Fraction, MD  Chief Complaint:    Chief Complaint  Patient presents with  . Esophageal Lesion,mass    of distal esophagus...UPPER ENDOSCOPY WITH BX 10/09/16, CT C/A/P 10/12/16...saw Dr. Burr Cunningham today    History of Present Illness:    Darrell Cunningham 56 y.o. male is seen in the office  today for Esophageal mass, thought to be adenocarcinoma but without definitive biopsy diagnosis. The patient denies any long-term history of acid reflux. He noted weight loss. Marland Kitchen He's noted increasing difficulty with pain with swallowing and with regurgitation. Because of these symptoms he underwent upper GI endoscopy by Dr. Hilarie Cunningham.  fungating mass extending up proximally 7 cm in the distal esophagus. CT scan suggests esophageal lymph nodes and a 2.3 cm mass. Biopsy were taken suggestive of adenocarcinoma but not diagnostic. Repeat biopsy was done 3/29 Darrell Cunningham Diagnosis Esophagogastric junction, biopsy, mass - INVASIVE ADENOCARCINOMA. Microscopic Comment Dr. Lyndon Cunningham has seen this case in consultation with agreement. The findings are called to Dr. Ardis Cunningham on 10/27/2016. 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 He is currently a nonsmoker having quit 4 years ago prior to that he smoked 1-2 packs per day for 38 years. He denies any alcohol intake for the last 20 years.  Wt Readings from Last 3 Encounters:  11/30/16 140 lb 9.6 oz (63.8 kg)  11/20/16 142 lb 8 oz (64.6 kg)  11/08/16 138 lb 6.4 oz (62.8 kg)   Patient is currently getting radiation and chemotherapy used complete his chemotherapy on May 8 and radiation on May 10. I discussed with him  returning to the office on May 31 for follow-up visit, arrange a follow-up CT scan of the chest and abdomen in mid June. And tentative surgical resection in last week of June or early July.    Current Activity/ Functional Status:  Patient is independent with mobility/ambulation, transfers, ADL's, IADL's.   Zubrod Score: At the time of surgery this patient's most appropriate activity status/level should be described as: []     0    Normal activity, no symptoms [x]     1    Restricted in physical strenuous activity but ambulatory, able to do out light work []     2    Ambulatory and capable of self care, unable to do work activities, up and about               >50 % of waking hours                              []     3    Only limited self care, in bed greater than 50% of waking hours []     4    Completely disabled, no self care, confined to bed or chair []     5    Moribund   Past Medical History:  Diagnosis Date  . Allergy   . Arthritis   . Asthma  as a child  . CP (cerebral palsy), spastic ([place])    right  . Dysphagia   . Encounter for nonprocreative genetic counseling 10/31/2016   Darrell Cunningham underwent genetic counseling for hereditary cancer syndromes on 10/31/2016. Though he is a candidate for genetic testing, he declines at this time.  . Hyperlipidemia   . Neuromuscular disorder ([place])   . Pneumonia 4 yrs ago    Past Surgical History:  Procedure Laterality Date  . EUS N/A 10/26/2016   Procedure: UPPER ENDOSCOPIC ULTRASOUND (EUS) LINEAR;  Surgeon: [name], MD;  Location: [facility];  Service: Endoscopy;  Laterality: N/A;  . EYE SURGERY Bilateral age 61    for cross eyes  . IR FLUORO GUIDE PORT INSERTION RIGHT  11/08/2016  . IR US GUIDE VASC ACCESS RIGHT  11/08/2016  . MOUTH SURGERY      Family History  Problem Relation Age of Onset  . Colon cancer Maternal Grandmother 76  . Breast cancer Maternal Grandmother 78  . Breast cancer Sister 5    Deceased at 18 of  breast cancer  . Ovarian cancer Maternal Aunt   . Stomach cancer Neg Hx   . Esophageal cancer Neg Hx     Social History   Social History  . Marital status: Married    Spouse name: N/A  . Number of children: N/A  . Years of education: N/A   Occupational History  . Currently on social security disability , he does do yard work on the side    Social History Main Topics  . Smoking status: Former Smoker    Packs/day: 1.00    Years: 38.00    Types: Cigarettes    Start date: 10/31/2012    Quit date: 10/31/2012  . Smokeless tobacco: Never Used  . Alcohol use No  . Drug use: No  . Sexual activity: Not on file     History  Smoking Status  . Former Smoker  . Packs/day: 1.00  . Years: 38.00  . Types: Cigarettes  . Start date: 10/31/2012  . Quit date: 10/31/2012  Smokeless Tobacco  . Never Used    History  Alcohol Use No     Allergies  Allergen Reactions  . Penicillins     Has patient had a PCN reaction causing immediate rash, facial/tongue/throat swelling, SOB or lightheadedness with hypotension: No Has patient had a PCN reaction causing severe rash involving mucus membranes or skin necrosis: No Has patient had a PCN reaction that required hospitalization No Has patient had a PCN reaction occurring within the last 10 years: No If all of the above answers are "NO", then may proceed with Cephalosporin use. Unknown Childhood reaction    Current Outpatient Prescriptions  Medication Sig Dispense Refill  . CARAFATE 1 GM/10ML suspension TAKE 10 MILLILITERS BY MOUTH 4 TIMES DAILY WITH MEALS AND AT BEDTIME 420 mL 1  . emollient (BIAFINE) cream Apply 1 application topically daily.    . fluticasone (FLONASE) 50 MCG/ACT nasal spray Place 1 spray into both nostrils daily as needed for allergies or rhinitis.    Marland Kitchen ibuprofen (ADVIL,MOTRIN) 200 MG tablet Take 800 mg by mouth daily as needed for headache or moderate pain.     Marland Kitchen lidocaine-prilocaine (EMLA) cream Apply 1 application topically  as needed. Apply to portacath  1 1/2 hours - 2 hours prior to procedures as needed. 30 g 1  . morphine (ROXANOL) 20 MG/ML concentrated solution Take 0.25-0.5 mLs (5-10 mg total) by mouth every 6 (six)  hours as needed for severe pain. 30 mL 0  . ondansetron (ZOFRAN) 8 MG tablet Take 1 tablet (8 mg total) by mouth 2 (two) times daily as needed for refractory nausea / vomiting. Start on day 3 after chemo. 30 tablet 1  . pantoprazole (PROTONIX) 40 MG tablet Take 1 tablet (40 mg total) by mouth 2 (two) times daily. 60 tablet 3  . PROAIR HFA 108 (90 Base) MCG/ACT inhaler INHALE 2 PUFFS INTO THE LUNGS EVERY 6 (SIX) HOURS AS NEEDED FOR WHEEZING OR SHORTNESS OF BREATH. 18 g 0  . prochlorperazine (COMPAZINE) 10 MG tablet Take 1 tablet (10 mg total) by mouth every 6 (six) hours as needed (Nausea or vomiting). 30 tablet 1  . tiZANidine (ZANAFLEX) 4 MG tablet TAKE 1 TABLET (4 MG TOTAL) BY MOUTH EVERY 6 (SIX) HOURS AS NEEDED FOR MUSCLE SPASMS. 30 tablet 0   No current facility-administered medications for this visit.       Review of Systems:     Cardiac Review of Systems: Y or N  Chest Pain Blue.Reese    ]  Resting SOB [ n  ] Exertional SOB  [ n ]  Orthopnea [n  ]   Pedal Edema [n   ]    Palpitations Florencio.Farrier  ] Syncope  Florencio.Farrier  ]   Presyncope Florencio.Farrier  ]  General Review of Systems: [Y] = yes [  ]=no Constitional: recent weight change [loss 13 lbs over 3 months  ];  Wt loss over the last 3 months [   ] anorexia Blue.Reese  ]; fatigue [  ]; nausea [  ]; night sweats [  ]; fever [  ]; or chills [  ];          Dental: poor dentition[ n ]; Last Dentist visit:   Eye : blurred vision [  ]; diplopia [   ]; vision changes [  ];  Amaurosis fugax[  ]; Resp: cough [ y ];  wheezing[ y ];  hemoptysis[  y]; shortness of breath[n  ]; paroxysmal nocturnal dyspnea[ n ]; dyspnea on exertion[ y ]; or orthopnea[  ];  GI:  gallstones[ n ], vomiting[ y ];  dysphagia[ y ]; melena[ n ];  hematochezia [ n ]; heartburn[  y];   Hx of  Colonoscopy[y ]; GU: kidney  stones [  ]; hematuria[ n ];   dysuria [  n];  nocturia[  ];  history of     obstruction [n  ]; urinary frequency [  ]             Skin: rash, swelling[  ];, hair loss[  ];  peripheral edema[  ];  or itching[  ]; Musculosketetal: myalgias[  ];  joint swelling[  ];  joint erythema[  ];  joint pain[  ];  back pain[  ];  Heme/Lymph: bruising[  ];  bleeding[  ];  anemia[  ];  Neuro: TIA[  ];  headaches[  ];  stroke[ n ];  vertigo[  ];  seizures[ n ];   paresthesias[  ];  difficulty walking[  ];  Psych:depression[  ]; anxiety[  ];  Endocrine: diabetes[ n ];  thyroid dysfunction[n  ];  Immunizations: Flu up to date Blue.Reese  ]; Pneumococcal up to date Florencio.Farrier  ];  Other:  Physical Exam: BP 125/78   Pulse 80   Resp 20   Ht 5\' 1"  (1.549 m)   Wt 140 lb 9.6 oz (63.8 kg)   SpO2 98% Comment:  RA  BMI 26.57 kg/m   PHYSICAL EXAMINATION: General appearance: alert and cooperative Head: Normocephalic, without obvious abnormality, atraumatic Neck: no adenopathy, no carotid bruit, no JVD, supple, symmetrical, trachea midline and thyroid not enlarged, symmetric, no tenderness/mass/nodules Lymph nodes: Cervical, supraclavicular, and axillary nodes normal. Resp: clear to auscultation bilaterally Back: . ROM normal. No CVA tenderness., Patient has curvature of the spine Cardio: regular rate and rhythm, S1, S2 normal, no murmur, click, rub or gallop GI: soft, non-tender; bowel sounds normal; no masses,  no organomegaly Extremities: extremities normal, atraumatic, no cyanosis or edema and Homans sign is negative, no sign of DVT Neurologic: Alert and oriented X 3, normal strength and tone on the left. Normal symmetric reflexes on the left. Patient has some difficulty with coordination and gait involving the right arm and right leg , he is able to ambulate without difficulty  Diagnostic Studies & Laboratory data:   Diagnosis Esophagus, biopsy, distal esophageal tumor (33-39) - SUSPICIOUS FOR ADENOCARCINOMA, SEE  COMMENT. Microscopic Comment The majority of fragments are reactive squamous mucosa with associated acute inflammation. There is a small fragment of atypical glandular epithelium with associated ulceration that is suspicious for adenocarcinoma. There is no background Barrett's esophagus in the glandular component. Dr. Saralyn Pilar has reviewed the case. The case was called to Dr. Hilarie Cunningham on 10/10/2016. [name] MD Pathologist, Electronic  - A large, fungating mass with no bleeding until contact was found in the lower third of the esophagus, 33 cm from the incisors and extending to the GE junction at 39 cm. The mass was partially obstructing and partially circumferential (involving one-half of the lumen circumference). Multiple biopsies were obtained with cold forceps for histology in a targeted manner. Findings: - The perviously described mass was also seen on retroflexion in the stomach at the gastroesophageal junction. - The exam of the stomach was otherwise normal. - The examined duodenum was normal.      EUS  Endoscopic Finding Findings: A medium-sized, fungating, very friable mass was found at the gastroesophageal junction. The 6cm long mass was partially obstructing and partially circumferential (involving two thirds of the lumen circumference). The bulk of the mass lays in the distal esophagus but it does stradle the GE junction with about 2cm of tumor below the Z line. Biopsies were taken with a cold forceps for histology. Endosonographic Finding 1. The mass above correlated with a hypoechoic and heterogenous lesion at the gastroesophageal junction. This ranges from 33 cm from the incisors and extended to 39 cm. The lesion was partially circumferential (involving two-thirds of the lumen circumference). The endosonographic borders were poorly-defined. The mass measured up to 16 mm in thickness. There was sonographic evidence suggesting invasion into the adventitia (Layer 5,  UT3). 2. There was one 1.3cm round, hypoechoic, heterogeneous lymphnode adjacent to the mass. This is presumed to be malignant based on EUS criteria (uN1). 3. Limited views of pancreas, spleen, liver were all normal.     Recent Radiology Findings:  Study Result   CLINICAL DATA:  Initial treatment strategy for esophageal cancer with suspicious paraesophageal and upper abdominal lymph nodes on previous CT.  EXAM: NUCLEAR MEDICINE PET SKULL BASE TO THIGH  TECHNIQUE: 7.3 mCi F-18 FDG was injected intravenously. Full-ring PET imaging was performed from the skull base to thigh after the radiotracer. CT data was obtained and used for attenuation correction and anatomic localization.  FASTING BLOOD GLUCOSE:  Value:  102 g/dl  COMPARISON:  CT scan 10/12/2016.  FINDINGS: NECK  No hypermetabolic lymph  nodes in the neck.  CHEST  The known distal esophageal mass is markedly hypermetabolic with SUV max = 36. Small adjacent paraesophageal lymph nodes seen on the previous diagnostic CT in also evident on today's CT for attenuation correction are too close to the markedly hypermetabolic primary lesion for resolution as separate structures on PET imaging. No evidence for hypermetabolism within the small gastrohepatic lymph node identified on the previous diagnostic CT, but at 8 mm short axis, microscopic disease in this lymph node may be in evident on PET imaging.  ABDOMEN/PELVIS  No abnormal hypermetabolic activity within the liver, pancreas, adrenal glands, or spleen. No hypermetabolic lymph nodes in the abdomen or pelvis.  There is abdominal aortic atherosclerosis without aneurysm.  SKELETON  No focal hypermetabolic activity to suggest skeletal metastasis.  IMPRESSION: 1. Markedly hypermetabolic distal esophageal lesion, compatible with malignancy. Adjacent small paraesophageal lymph nodes are abnormal by CT but cannot be resolved as separate structures from  the hypermetabolic esophageal activity on the PET images. No hypermetabolism is demonstrated in the upper abdominal/gastrohepatic ligament lymph node although the small size of this lymph node may be below threshold for detection on PET imaging. 2. No evidence for distant hypermetabolic metastatic disease in the neck, chest, abdomen, or pelvis.   Electronically Signed   By: Misty Stanley M.D.   On: 10/24/2016 10:22      Ct Chest W Contrast and Ct Abdomen Pelvis W Contrast  Result Date: 10/13/2016 CLINICAL DATA:  Patient with history of esophageal carcinoma. Painful swallowing for 4-5 weeks. EXAM: CT CHEST, ABDOMEN, AND PELVIS WITH CONTRAST TECHNIQUE: Multidetector CT imaging of the chest, abdomen and pelvis was performed following the standard protocol during bolus administration of intravenous contrast. CONTRAST:  177mL ISOVUE-300 IOPAMIDOL (ISOVUE-300) INJECTION 61% COMPARISON:  None. FINDINGS: CT CHEST FINDINGS Cardiovascular: The heart is normal in size. Coronary arterial vascular calcifications. No pericardial effusion. Aorta and main pulmonary artery normal in caliber. Mediastinum/Nodes: No enlarged axillary or hilar adenopathy. There is a prominent mildly enhancing 6 mm node adjacent to the right aspect of the distal esophagus (image 42; series 2). There is an irregular mass within the distal esophagus measuring up to 2.3 cm in thickness. There is an additional adjacent prominent 5 mm node posterior to the midesophagus (image 29; series 2). Lungs/Pleura: Probable small amount of mucus within the right mainstem bronchus. No large area of pulmonary consolidation. No pleural effusion or pneumothorax. 2 mm nodule within the left lower lobe (image 113; series 3). Musculoskeletal: No aggressive or acute appearing osseous lesions. CT ABDOMEN PELVIS FINDINGS Hepatobiliary: The liver is normal in size and contour. No focal hepatic lesion is identified. Gallbladder is unremarkable. No intrahepatic  or extrahepatic biliary ductal dilatation. Pancreas: Unremarkable Spleen: Unremarkable Adrenals/Urinary Tract: Adrenal glands are normal. Kidneys enhance symmetrically with contrast. No hydronephrosis. Urinary bladder is decompressed. Stomach/Bowel: No abnormal bowel wall thickening or evidence for bowel obstruction. No free fluid or free intraperitoneal air. Vascular/Lymphatic: Peripheral calcified atherosclerotic plaque. Nonspecific 8 mm gastrohepatic lymph node (image 53; series 2). Reproductive: Prostate is unremarkable. Other: Small fat containing right inguinal hernia. Musculoskeletal: Lumbar spine degenerative changes. No aggressive or acute appearing osseous lesions. IMPRESSION: Distal esophageal mass compatible with primary esophageal malignancy. There are 2 adjacent abnormal appearing subcentimeter paraesophageal lymph nodes which may represent nodal metastasis. Additionally there is a prominent nonspecific 8 mm upper abdominal lymph node. No evidence for distant metastatic disease in the chest, abdomen or pelvis. Electronically Signed   By: Polly Cobia.D.  On: 10/13/2016 08:40     I have independently reviewed the above radiologic studies.  Recent Lab Findings: Lab Results  Component Value Date   WBC 3.4 (L) 11/28/2016   HGB 13.2 11/28/2016   HCT 39.6 11/28/2016   PLT 111 (L) 11/28/2016   GLUCOSE 139 11/28/2016   CHOL 157 10/27/2014   TRIG 83 10/27/2014   HDL 34 (L) 10/27/2014   LDLCALC 106 (H) 10/27/2014   ALT 29 11/28/2016   AST 17 11/28/2016   NA 141 11/28/2016   K 3.9 11/28/2016   CL 104 11/08/2016   CREATININE 0.8 11/28/2016   BUN 13.6 11/28/2016   CO2 27 11/28/2016   INR 0.97 11/08/2016   HGBA1C 5.7 (H) 10/27/2014      Assessment / Plan:   1 Adenocarcinoma the distal esophagus, adenocarcinoma Clinical stage from 10/26/2016: Stage III (cT3, cN1, cM0)  I discussed with the patient and his family consideration for surgical resection approximately 6-8 weeks following  completion of radiation and chemotherapy and after follow-up CT scan of chest abdomen and pelvis. The risks and options of surgery were discussed with the patient in detail. He is willing to proceed with this as we recommend. We'll plan to see him back May 31 and at that time make arrangements for follow-up CT. He has family were given printed material also concerning esophagectomy.   2 mild cerebral palsy affecting the right arm and leg.     [name] MD      [address] [place] [address] Office [phone]   [place] [phone]  11/30/2016 10:38 AM

## 2016-11-30 NOTE — Progress Notes (Signed)
Darrell Cunningham  Telephone:(336) [phone] Fax:([phone]) [phone]  Clinic Follow-up Note   Patient Care Team: [name], MD as PCP - General (Family Medicine) 12/04/2016   CHIEF COMPLAINTS:  Follow up esophageal adenocarcinoma   Oncology History   Cancer Staging Esophageal cancer [facility]) Staging form: Esophagus - Adenocarcinoma, AJCC 8th Edition - Clinical stage from 10/26/2016: Stage III (cT3, cN1, cM0) - Signed by [name], MD on 11/05/2016       Malignant neoplasm of lower third of esophagus ([place])   09/21/2016 - 09/21/2016 Hospital Admission    esophageal pain and vomiting up blood      10/09/2016 Initial Diagnosis    Esophageal cancer ([place])      10/09/2016 Procedure    EGD 1. Partially obstructing, likely malignant esophageal tumor was found in the lower third of the esophagus. Multiple biopsies.  2. Mass visible during gastric retroflexion at GE junction 3. Otherwise normal stomach 4. Normal examined duodenum       10/09/2016 Pathology Results    Esophagus, biopsy, distal esophageal tumor (33-39) - SUSPICIOUS FOR ADENOCARCINOMA      10/12/2016 Imaging    CT CAP w Contrast IMPRESSION: Distal esophageal mass compatible with primary esophageal malignancy. There are 2 adjacent abnormal appearing subcentimeter paraesophageal lymph nodes which may represent nodal metastasis. Additionally there is a prominent nonspecific 8 mm upper abdominal lymph node.  No evidence for distant metastatic disease in the chest, abdomen or pelvis.      10/24/2016 PET scan    1. Markedly hypermetabolic distal esophageal lesion, compatible with malignancy. Adjacent small paraesophageal lymph nodes are abnormal by CT but cannot be resolved as separate structures from the hypermetabolic esophageal activity on the PET images. No hypermetabolism is demonstrated in the upper abdominal/gastrohepatic ligament lymph node although the small size of this lymph node may be below  threshold for detection on PET imaging. 2. No evidence for distant hypermetabolic metastatic disease in the neck, chest, abdomen, or pelvis.      10/26/2016 Pathology Results    Esophagogastric junction, biopsy, mass - INVASIVE ADENOCARCINOMA.      10/26/2016 Procedure    EUS showed uT3N1 disease, and biopsy confirmed adenocarcinoma       10/30/2016 -  Radiation Therapy    Neoadjuvant radiation to esophageal cancer        10/31/2016 -  Neo-Adjuvant Chemotherapy    Weekly Carboplatin AUC 2 and taxol 50mg /m2 with concurrent radiation        HISTORY OF PRESENTING ILLNESS (10/16/2016):  Darrell Cunningham 56 y.o. male is here because of an esophageal mass that is suspicious for adenocarcinoma. The pt presented to the ED on 09/21/2016 for esophageal pain and vomiting up blood. He had been struggling with regurgitating food or drinks, difficulty swallowing, and throat pain for 3 weeks prior. He was referred by his PCP to GI who ordered him an EGD with dilation with Dr. [name] as soon as possible. The EGD and biopsy was performed on 10/09/2016, which showed suspicion of adenocarcinoma of the esophagus and a CT scan was ordered. The CT results demonstrated a distal esophageal mass compatible with primary esophageal malignancy. He is here to discuss treatment.   When he has trouble swallowing, he has to stand up and push his head back to get the food down. He has mostly eating oatmeal, Ensure, and other soft foods. He drinks 2-3 Ensure a day. He has cut out coffee and acidic foods to try to relieve his pain. It  is not getting better, though. He isn't able to eat much due to this pain and difficulty swallowing. He does not take any pain medication. He has lost about 13 pounds. He also has some left sided abdominal pain. At night, he has a lot of acid reflux and sputum production. Denies constipation, diarrhea, nausea, SOB, or any other concerns.   He has a history of cerebral palsy on his right side. He  has some back pain due to a curvature in his spine. History of asthma. His grandmother had breast cancer and colon cancer. His sister passed of breast cancer at age 58. His aunt had ovarian cancer. No other family history of cancer. He quit smoking 4 years ago. 38 pack years, 1-1.5 ppd. He lives at home with his 28 y.o. Daughter. His parents live about 15 minutes away.   CURRENT THERAPY: Concurrent chemo and radiation, with weekly Carboplatin AUC 2 and taxol 50mg /m2   INTERVAL HISTORY:  The patient returns for follow up. Darrell Cunningham presents to the clinic. He reports is at 137 lbs and he started at 160 lbs. He reports burning in his throat from radiation. He reports he will have surgery in Late June/Early July with Dr. [name]. He is seeing his dietician tomorrow. He reports eating softer foods and Stouffers. He also reports drinking 3 ensure a day and also eating ice cream. Denies irregular BM or cough, a week ago he describes shooting chest pain. He is still taking Carafate as needed to alleviate this discomfort.   MEDICAL HISTORY:  Past Medical History:  Diagnosis Date  . Allergy   . Arthritis   . Asthma    as a child  . 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 Dysphagia   . Encounter for nonprocreative genetic counseling 10/31/2016   Mr. Nam underwent genetic counseling for hereditary cancer syndromes on 10/31/2016. Though he is a candidate for genetic testing, he declines at this time.  . Hyperlipidemia   . Neuromuscular disorder ([place])   . Pneumonia 4 yrs ago    SURGICAL HISTORY: Past Surgical History:  Procedure Laterality Date  . EUS N/A 10/26/2016   Procedure: UPPER ENDOSCOPIC ULTRASOUND (EUS) LINEAR;  Surgeon: [name], MD;  Location: [facility] ENDOSCOPY;  Service: Endoscopy;  Laterality: N/A;  . EYE SURGERY Bilateral age 84    for cross eyes  . IR FLUORO GUIDE PORT INSERTION RIGHT  11/08/2016  . IR US GUIDE VASC ACCESS RIGHT  11/08/2016  . MOUTH SURGERY      SOCIAL  HISTORY: Social History   Social History  . Marital status: Legally Separated    Spouse name: N/A  . Number of children: N/A  . Years of education: N/A   Occupational History  . Not on file.   Social History Main Topics  . Smoking status: Former Smoker    Packs/day: 1.00    Years: 38.00    Types: Cigarettes    Start date: 10/31/2012    Quit date: 10/31/2012  . Smokeless tobacco: Never Used  . Alcohol use No  . Drug use: No  . Sexual activity: Not on file   Other Topics Concern  . Not on file   Social History Narrative  . No narrative on file    FAMILY HISTORY: Family History  Problem Relation Age of Onset  . Colon cancer Maternal Grandmother 11  . Breast cancer Maternal Grandmother 69  . Breast cancer Sister 71    Deceased at 88 of  breast cancer  . Ovarian cancer Maternal Aunt   . Stomach cancer Neg Hx   . Esophageal cancer Neg Hx     ALLERGIES:  is allergic to penicillins.  MEDICATIONS:  Current Outpatient Prescriptions  Medication Sig Dispense Refill  . CARAFATE 1 GM/10ML suspension TAKE 10 MILLILITERS BY MOUTH 4 TIMES DAILY WITH MEALS AND AT BEDTIME 420 mL 1  . emollient (BIAFINE) cream Apply 1 application topically daily.    . fluticasone (FLONASE) 50 MCG/ACT nasal spray Place 1 spray into both nostrils daily as needed for allergies or rhinitis.    Marland Kitchen ibuprofen (ADVIL,MOTRIN) 200 MG tablet Take 800 mg by mouth daily as needed for headache or moderate pain.     Marland Kitchen lidocaine-prilocaine (EMLA) cream Apply 1 application topically as needed. Apply to portacath  1 1/2 hours - 2 hours prior to procedures as needed. 30 g 1  . morphine (ROXANOL) 20 MG/ML concentrated solution Take 0.25-0.5 mLs (5-10 mg total) by mouth every 6 (six) hours as needed for severe pain. 30 mL 0  . ondansetron (ZOFRAN) 8 MG tablet Take 1 tablet (8 mg total) by mouth 2 (two) times daily as needed for refractory nausea / vomiting. Start on day 3 after chemo. 30 tablet 1  . pantoprazole (PROTONIX)  40 MG tablet Take 1 tablet (40 mg total) by mouth 2 (two) times daily. 60 tablet 3  . PROAIR HFA 108 (90 Base) MCG/ACT inhaler INHALE 2 PUFFS INTO THE LUNGS EVERY 6 (SIX) HOURS AS NEEDED FOR WHEEZING OR SHORTNESS OF BREATH. 18 g 0  . prochlorperazine (COMPAZINE) 10 MG tablet Take 1 tablet (10 mg total) by mouth every 6 (six) hours as needed (Nausea or vomiting). 30 tablet 1  . tiZANidine (ZANAFLEX) 4 MG tablet TAKE 1 TABLET (4 MG TOTAL) BY MOUTH EVERY 6 (SIX) HOURS AS NEEDED FOR MUSCLE SPASMS. 30 tablet 0   No current facility-administered medications for this visit.    REVIEW OF SYSTEMS:  Constitutional: Denies fevers, chills or abnormal night sweats (+) weight loss Eyes: Denies blurriness of vision, double vision or watery eyes Ears, nose, mouth, throat, and face: Denies mucositis or sore throat (+) throat pain (+) trouble swallowing solids Respiratory: Denies cough, dyspnea or wheezes  Cardiovascular: Denies palpitation, chest discomfort or lower extremity swelling Gastrointestinal:  Denies nausea, heartburn or change in bowel habits (+) abdominal pain (+) constipation  Skin: Denies abnormal skin rashes (+) right arm red and swollen (+) itching  Lymphatics: Denies new lymphadenopathy or easy bruising Neurological:Denies numbness, tingling or new weaknesses  Musculoskeletal: Negative Behavioral/Psych: Mood is stable, no new changes (+) insomnia  All other systems were reviewed with the patient and are negative.  PHYSICAL EXAMINATION: ECOG PERFORMANCE STATUS: 1 - Symptomatic but completely ambulatory  Vitals:   12/04/16 0958  BP: 115/76  Pulse: 86  Resp: 20  Temp: 98.7 F (37.1 C)   Filed Weights   12/04/16 0958  Weight: 137 lb 11.2 oz (62.5 kg)    GENERAL:alert, no distress and comfortable (+) limp from right sided cerebral palsy (+) weight loss of about 2 lbs since 10/19/16 SKIN: skin color, texture, turgor are normal, no rashes or significant lesions EYES: normal,  conjunctiva are pink and non-injected, sclera clear OROPHARYNX:no exudate, no erythema and lips, buccal mucosa, and tongue normal  NECK: supple, thyroid normal size, non-tender, without nodularity LYMPH:  no palpable lymphadenopathy in the cervical, axillary or inguinal LUNGS: clear to auscultation and percussion with normal breathing effort (+) wheezing right lung  HEART: regular rate & rhythm and no murmurs and no lower extremity edema ABDOMEN:abdomen soft, non-tender and normal bowel sounds (+) tenderness  Musculoskeletal:no cyanosis of digits and no clubbing  PSYCH: alert & oriented x 3 with fluent speech NEURO: no focal motor/sensory deficits  LABORATORY DATA:  I have reviewed the data as listed CBC Latest Ref Rng & Units 11/28/2016 11/20/2016 11/14/2016  WBC 4.0 - 10.3 10e3/uL 3.4(L) 4.1 3.6(L)  Hemoglobin 13.0 - 17.1 g/dL 13.2 13.3 12.7(L)  Hematocrit 38.4 - 49.9 % 39.6 39.6 37.8(L)  Platelets 140 - 400 10e3/uL 111(L) 144 165   CMP Latest Ref Rng & Units 11/28/2016 11/20/2016 11/14/2016  Glucose 70 - 140 mg/dl 139 100 104  BUN 7.0 - 26.0 mg/dL 13.6 11.8 9.8  Creatinine 0.7 - 1.3 mg/dL 0.8 0.7 0.7  Sodium 136 - 145 mEq/L 141 141 140  Potassium 3.5 - 5.1 mEq/L 3.9 4.4 4.5  Chloride 101 - 111 mmol/L - - -  CO2 22 - 29 mEq/L 27 26 25   Calcium 8.4 - 10.4 mg/dL 9.6 9.7 9.0  Total Protein 6.4 - 8.3 g/dL 7.0 7.2 6.9  Total Bilirubin 0.20 - 1.20 mg/dL 0.38 0.30 <0.22  Alkaline Phos 40 - 150 U/L 70 72 74  AST 5 - 34 U/L 17 14 14   ALT 0 - 55 U/L 29 27 27    PATHOLOGY:  Diagnosis 10/26/2016 Esophagogastric junction, biopsy, mass - INVASIVE ADENOCARCINOMA.  Diagnosis 10/09/2016 Esophagus, biopsy, distal esophageal tumor (33-39) - SUSPICIOUS FOR ADENOCARCINOMA, SEE COMMENT.Marland Kitchen  RADIOGRAPHIC STUDIES: I have personally reviewed the radiological images as listed and agreed with the findings in the report.  PET 10/24/2016 IMPRESSION: 1. Markedly hypermetabolic distal esophageal lesion,  compatible with malignancy. Adjacent small paraesophageal lymph nodes are abnormal by CT but cannot be resolved as separate structures from the hypermetabolic esophageal activity on the PET images. No hypermetabolism is demonstrated in the upper abdominal/gastrohepatic ligament lymph node although the small size of this lymph node may be below threshold for detection on PET imaging. 2. No evidence for distant hypermetabolic metastatic disease in the neck, chest, abdomen, or pelvis.  CT CAP w Contrast 10/12/2016 IMPRESSION: Distal esophageal mass compatible with primary esophageal malignancy. There are 2 adjacent abnormal appearing subcentimeter paraesophageal lymph nodes which may represent nodal metastasis. Additionally there is a prominent nonspecific 8 mm upper abdominal lymph node.  No evidence for distant metastatic disease in the chest, abdomen or pelvis.  EGD 10/09/2016   ASSESSMENT & PLAN:  Darrell Cunningham is a 56 y.o. Caucasian male with PMH cerebral palsy and heavy smoking, who presents with dysphagia, odynophagia and weight loss   1. Low esophageal Adenocarcinoma, cT3N1M0, stage III -I previously reviewed the imaging and biopsy pathology results with the patient in detail. Although his biopsy pathology was not definitive for malignancy, based on his endoscopy findings, this is most consistent with esophageal adenocarcinoma. -I previously reviewed his PET scan images with patient in person, which showed intense hypermetabolic esophageal cancer, no other distant metastasis -I reviewed the EUS and re-biopsy results, which showed T3N1 disease, and biopsy confirmed adenocarcinoma -We previously reviewed the standard care for locally advanced esophageal cancer, including neoadjuvant chemoradiation, followed by esophagectomy.  -He was previously referred to see thoracic surgeon Dr. Pia Mau. -He has previously started concurrent chemoradiation with weekly carbo and Taxol, tolerating well  overall  -previously his dysphagia has slightly improved -Email has developed odynophagia, likely from radiation and chemotherapy, I'll call in magic mouthwash today. -Repeat Today, if adequate, will proceed with  last cycle chemotherapy tomorrow. He is finishing radiation in 2 days. -I encourage him to drink plenty of water, eat soft foods, and eat slowly. Follow up with dietitian  -Continue using zofran and compazine as needed   2. Dysphagia, odynophagia and weight loss -Difficulty eating due to esophageal pain -He can eat soft foods, which occasionally get stuck -Drinks 3 Ensure a day. I previously encouraged 4-5 a day.  -He will f/u with our dietician -previously His symptoms has slightly improved since he started the chemoradiation, he is tolerating treatment well. -few days after Radiation he experiences burning in his throat as well  -Will prescribe magic mouth wash to help with pain before eating -He has lost about 23 pounds since he started treatment, he will follow-up with dietitian tomorrow.  3. History of heavy smoking -He has quit smoking completely, history of 40 pack year.   4. Constipation -I previously encouraged the use of MiraLax daily or more if needed  -This may get worse due to morphine use    Plan: -Labs today and treatment cycle 6 tomorrow 5/8 if lab adequate  -order Magic Mouth wash today -f/u with labs and flush in 2 weeks   All questions were answered. The patient knows to call the clinic with any problems, questions or concerns.  I spent 20 minutes counseling the patient face to face. The total time spent in the appointment was 25 minutes and more than 50% was on counseling.  This document serves as a record of services personally performed by [name], MD. It was created on her behalf by Joslyn Devon, a trained medical scribe. The creation of this record is based on the scribe's personal observations and the provider's statements to them. This document  has been checked and approved by the attending provider. .  I have reviewed the above documentation for accuracy and completeness and I agree with the above.   [name], MD 12/04/2016

## 2016-12-01 ENCOUNTER — Ambulatory Visit
Admission: RE | Admit: 2016-12-01 | Discharge: 2016-12-01 | Disposition: A | Discharge status: Home / Self Care | Payer: Medicaid Other | Source: Ambulatory Visit | Attending: Radiation Oncology | Admitting: Radiation Oncology

## 2016-12-01 DIAGNOSIS — Z510 Encounter for antineoplastic radiation therapy: Secondary | ICD-10-CM | POA: Diagnosis not present

## 2016-12-04 ENCOUNTER — Telehealth: Payer: Self-pay | Admitting: Hematology

## 2016-12-04 ENCOUNTER — Encounter: Payer: Self-pay | Admitting: Hematology

## 2016-12-04 ENCOUNTER — Other Ambulatory Visit: Payer: Self-pay | Admitting: *Deleted

## 2016-12-04 ENCOUNTER — Ambulatory Visit: Payer: Medicaid Other

## 2016-12-04 ENCOUNTER — Ambulatory Visit (HOSPITAL_BASED_OUTPATIENT_CLINIC_OR_DEPARTMENT_OTHER): Payer: Medicaid Other | Admitting: Hematology

## 2016-12-04 ENCOUNTER — Ambulatory Visit (HOSPITAL_BASED_OUTPATIENT_CLINIC_OR_DEPARTMENT_OTHER): Payer: Medicaid Other

## 2016-12-04 ENCOUNTER — Ambulatory Visit
Admission: RE | Admit: 2016-12-04 | Discharge: 2016-12-04 | Disposition: A | Discharge status: Home / Self Care | Payer: Medicaid Other | Source: Ambulatory Visit | Attending: Radiation Oncology | Admitting: Radiation Oncology

## 2016-12-04 VITALS — BP 115/76 | HR 86 | Temp 98.7°F | Resp 20 | Ht 61.0 in | Wt 137.7 lb

## 2016-12-04 DIAGNOSIS — Z87891 Personal history of nicotine dependence: Secondary | ICD-10-CM

## 2016-12-04 DIAGNOSIS — R1310 Dysphagia, unspecified: Secondary | ICD-10-CM | POA: Diagnosis not present

## 2016-12-04 DIAGNOSIS — C155 Malignant neoplasm of lower third of esophagus: Secondary | ICD-10-CM

## 2016-12-04 DIAGNOSIS — K5900 Constipation, unspecified: Secondary | ICD-10-CM

## 2016-12-04 DIAGNOSIS — Z510 Encounter for antineoplastic radiation therapy: Secondary | ICD-10-CM | POA: Diagnosis not present

## 2016-12-04 LAB — COMPREHENSIVE METABOLIC PANEL
ALT: 41 U/L (ref 0–55)
ANION GAP: 9 meq/L (ref 3–11)
AST: 22 U/L (ref 5–34)
Albumin: 3.4 g/dL — ABNORMAL LOW (ref 3.5–5.0)
Alkaline Phosphatase: 70 U/L (ref 40–150)
BILIRUBIN TOTAL: 0.53 mg/dL (ref 0.20–1.20)
BUN: 9.7 mg/dL (ref 7.0–26.0)
CALCIUM: 9.4 mg/dL (ref 8.4–10.4)
CHLORIDE: 107 meq/L (ref 98–109)
CO2: 26 mEq/L (ref 22–29)
CREATININE: 0.7 mg/dL (ref 0.7–1.3)
Glucose: 96 mg/dl (ref 70–140)
POTASSIUM: 3.8 meq/L (ref 3.5–5.1)
Sodium: 142 mEq/L (ref 136–145)
Total Protein: 7 g/dL (ref 6.4–8.3)

## 2016-12-04 LAB — CBC WITH DIFFERENTIAL/PLATELET
BASO%: 0.8 % (ref 0.0–2.0)
BASOS ABS: 0 10*3/uL (ref 0.0–0.1)
EOS%: 0.8 % (ref 0.0–7.0)
Eosinophils Absolute: 0 10*3/uL (ref 0.0–0.5)
HEMATOCRIT: 38.3 % — AB (ref 38.4–49.9)
HGB: 13.3 g/dL (ref 13.0–17.1)
LYMPH#: 0.1 10*3/uL — AB (ref 0.9–3.3)
LYMPH%: 4.5 % — AB (ref 14.0–49.0)
MCH: 30.3 pg (ref 27.2–33.4)
MCHC: 34.7 g/dL (ref 32.0–36.0)
MCV: 87.4 fL (ref 79.3–98.0)
MONO#: 0.2 10*3/uL (ref 0.1–0.9)
MONO%: 7.8 % (ref 0.0–14.0)
NEUT#: 2.5 10*3/uL (ref 1.5–6.5)
NEUT%: 86.1 % — AB (ref 39.0–75.0)
PLATELETS: 119 10*3/uL — AB (ref 140–400)
RBC: 4.38 10*6/uL (ref 4.20–5.82)
RDW: 15 % — ABNORMAL HIGH (ref 11.0–14.6)
WBC: 3 10*3/uL — ABNORMAL LOW (ref 4.0–10.3)

## 2016-12-04 MED ORDER — MAGIC MOUTHWASH W/LIDOCAINE: 5.0000 mL | Freq: Four times a day (QID) | ORAL | 0 refills | Status: DC | PRN

## 2016-12-04 NOTE — Telephone Encounter (Signed)
Gave patient AVS and calender per 5/7 los. - lab r/s from 5/8 to 5/7 today .

## 2016-12-05 ENCOUNTER — Ambulatory Visit (HOSPITAL_BASED_OUTPATIENT_CLINIC_OR_DEPARTMENT_OTHER): Payer: Medicaid Other

## 2016-12-05 ENCOUNTER — Ambulatory Visit
Admission: RE | Admit: 2016-12-05 | Discharge: 2016-12-05 | Disposition: A | Discharge status: Home / Self Care | Payer: Medicaid Other | Source: Ambulatory Visit | Attending: Radiation Oncology | Admitting: Radiation Oncology

## 2016-12-05 ENCOUNTER — Ambulatory Visit: Payer: Medicaid Other

## 2016-12-05 ENCOUNTER — Other Ambulatory Visit: Payer: Medicaid Other

## 2016-12-05 VITALS — BP 105/71 | HR 91 | Temp 97.6°F | Resp 18

## 2016-12-05 DIAGNOSIS — Z5111 Encounter for antineoplastic chemotherapy: Secondary | ICD-10-CM | POA: Diagnosis not present

## 2016-12-05 DIAGNOSIS — C155 Malignant neoplasm of lower third of esophagus: Secondary | ICD-10-CM

## 2016-12-05 DIAGNOSIS — Z510 Encounter for antineoplastic radiation therapy: Secondary | ICD-10-CM | POA: Diagnosis not present

## 2016-12-05 MED ORDER — PALONOSETRON HCL INJECTION 0.25 MG/5ML
0.2500 mg | Freq: Once | INTRAVENOUS | Status: AC
  Administered 2016-12-05: 0.25 mg via INTRAVENOUS

## 2016-12-05 MED ORDER — PACLITAXEL PROTEIN-BOUND CHEMO INJECTION 100 MG
50.0000 mg/m2 | Freq: Once | INTRAVENOUS | Status: AC
  Administered 2016-12-05: 75 mg via INTRAVENOUS
  Filled 2016-12-05: qty 15

## 2016-12-05 MED ORDER — SODIUM CHLORIDE 0.9 % IV SOLN
Freq: Once | INTRAVENOUS | Status: AC
  Administered 2016-12-05: 11:00:00 via INTRAVENOUS

## 2016-12-05 MED ORDER — SODIUM CHLORIDE 0.9% FLUSH
10.0000 mL | INTRAVENOUS | Status: DC | PRN
  Administered 2016-12-05: 10 mL
  Filled 2016-12-05: qty 10

## 2016-12-05 MED ORDER — SODIUM CHLORIDE 0.9 % IV SOLN
240.0000 mg | Freq: Once | INTRAVENOUS | Status: AC
  Administered 2016-12-05: 240 mg via INTRAVENOUS
  Filled 2016-12-05: qty 24

## 2016-12-05 MED ORDER — HEPARIN SOD (PORK) LOCK FLUSH 100 UNIT/ML IV SOLN
500.0000 [IU] | Freq: Once | INTRAVENOUS | Status: AC | PRN
  Administered 2016-12-05: 500 [IU]
  Filled 2016-12-05: qty 5

## 2016-12-05 MED ORDER — SODIUM CHLORIDE 0.9 % IV SOLN
20.0000 mg | Freq: Once | INTRAVENOUS | Status: AC
  Administered 2016-12-05: 20 mg via INTRAVENOUS
  Filled 2016-12-05: qty 2

## 2016-12-05 MED ORDER — PALONOSETRON HCL INJECTION 0.25 MG/5ML
INTRAVENOUS | Status: AC
  Filled 2016-12-05: qty 5

## 2016-12-05 NOTE — Progress Notes (Signed)
Nutrition Follow-up:  Nutrition follow-up completed with patient during infusion this pm.  Patient here for last chemotherapy treatment for esophageal cancer.  Planning last radiation treatment on May 10th.    Patient eating chicken noodle soup during visit.  Reports that his throat is sore and having burning in chest, esophagus area due to radiation.  "It hurts to drink water."  Reports that he has to pick up new medication Dr. Burr Medico prescribed for him to try.  Patient reports, "I can eat anything when I don't have that burning sensation." Reports that he has only been drinking 2 ensure per day.    Noted planning surgery late June/early July.  Medications: magic mouthwash with lidocaine, morphine  Labs: reviewed  Anthropometrics:   Weight taken on 5/7 and 137 lb 11.2 oz decreased from weight on 5/3 of 140 lb 9.6 oz and weight of 142 lb 8 oz on 4/23.     NUTRITION DIAGNOSIS: Inadequate oral intake related to cancer and cancer related treatments as evidenced by 3% weight loss in the last 2 weeks    MALNUTRITION DIAGNOSIS:  Continue to monitor   INTERVENTION:   Recommended increase ensure plus/boost plus to 4 per day.  Patient verbalized understanding. Encouraged patient to use medication to relieve symptoms to improve nutrition.  Encouraged patient if medications are not working to let MD know to see if there is an alternative.  Encouraged high calorie, high protein foods.    MONITORING, EVALUATION, GOAL: Patient will tolerate adequate calories and protein to maintain current weight   NEXT VISIT: phone call follow-up in 2 weeks  Leotis Isham B. Zenia Resides, [place], [place] Registered Dietitian [phone] (pager)

## 2016-12-05 NOTE — Patient Instructions (Signed)
[facility] Discharge Instructions for Patients Receiving Chemotherapy  Today you received the following chemotherapy agents Abraxane and Carboplatin  To help prevent nausea and vomiting after your treatment, we encourage you to take your nausea medication as prescribed.  If you develop nausea and vomiting that is not controlled by your nausea medication, call the clinic.   BELOW ARE SYMPTOMS THAT SHOULD BE REPORTED IMMEDIATELY:  *FEVER GREATER THAN 100.5 F  *CHILLS WITH OR WITHOUT FEVER  NAUSEA AND VOMITING THAT IS NOT CONTROLLED WITH YOUR NAUSEA MEDICATION  *UNUSUAL SHORTNESS OF BREATH  *UNUSUAL BRUISING OR BLEEDING  TENDERNESS IN MOUTH AND THROAT WITH OR WITHOUT PRESENCE OF ULCERS  *URINARY PROBLEMS  *BOWEL PROBLEMS  UNUSUAL RASH Items with * indicate a potential emergency and should be followed up as soon as possible.  Feel free to call the clinic should you have any questions or concerns. The clinic phone number is [phone].  Please show the CHEMO ALERT CARD at check-in to the Emergency Department and triage nurse.   

## 2016-12-06 ENCOUNTER — Ambulatory Visit
Admission: RE | Admit: 2016-12-06 | Discharge: 2016-12-06 | Disposition: A | Discharge status: Home / Self Care | Payer: Medicaid Other | Source: Ambulatory Visit | Attending: Radiation Oncology | Admitting: Radiation Oncology

## 2016-12-06 ENCOUNTER — Ambulatory Visit: Payer: Medicaid Other

## 2016-12-06 DIAGNOSIS — Z510 Encounter for antineoplastic radiation therapy: Secondary | ICD-10-CM | POA: Diagnosis not present

## 2016-12-07 ENCOUNTER — Ambulatory Visit
Admission: RE | Admit: 2016-12-07 | Discharge: 2016-12-07 | Disposition: A | Discharge status: Home / Self Care | Payer: Medicaid Other | Source: Ambulatory Visit | Attending: Radiation Oncology | Admitting: Radiation Oncology

## 2016-12-07 DIAGNOSIS — Z510 Encounter for antineoplastic radiation therapy: Secondary | ICD-10-CM | POA: Diagnosis not present

## 2016-12-12 ENCOUNTER — Encounter: Payer: Self-pay | Admitting: Radiation Oncology

## 2016-12-12 NOTE — Progress Notes (Signed)
  Radiation Oncology         [phone] ________________________________  Name: Darrell Cunningham MRN: [id number]  Date: 12/12/2016  DOB: [birth year]  End of Treatment Note  Diagnosis:  Probable distal esophageal cancer     Indication for treatment:  Curative       Radiation treatment dates:  10/30/16-12/07/16  Site/dose: 1) Esophagus/ 45 Gy in 25 fractions   2) Esophagus boost/ 5.4 Gy in 3 fractions  Beams/energy:  1) 3D/ 6xFFF    2) 3D/ 6xFFF  Narrative: The patient tolerated radiation treatment relatively well. Patient reported mild pain to the abdomen and pain in the throat causing poor eating habits. He also noted increased fatigue.  Plan: The patient has completed radiation treatment. The patient will return to radiation oncology clinic for routine followup in one month. I advised them to call or return sooner if they have any questions or concerns related to their recovery or treatment.  ------------------------------------------------  Jodelle Gross, MD, PhD  This document serves as a record of services personally performed by Kyung Rudd, MD. It was created on his behalf by Bethann Humble, a trained medical scribe. The creation of this record is based on the scribe's personal observations and the provider's statements to them. This document has been checked and approved by the attending provider.

## 2016-12-14 ENCOUNTER — Emergency Department (HOSPITAL_COMMUNITY)
Admission: EM | Admit: 2016-12-14 | Discharge: 2016-12-14 | Disposition: A | Discharge status: Home / Self Care | Payer: Medicaid Other | Attending: Emergency Medicine | Admitting: Emergency Medicine

## 2016-12-14 ENCOUNTER — Other Ambulatory Visit: Payer: Self-pay

## 2016-12-14 ENCOUNTER — Encounter (HOSPITAL_COMMUNITY): Payer: Self-pay | Admitting: Emergency Medicine

## 2016-12-14 ENCOUNTER — Emergency Department (HOSPITAL_COMMUNITY): Payer: Medicaid Other

## 2016-12-14 DIAGNOSIS — R1013 Epigastric pain: Secondary | ICD-10-CM

## 2016-12-14 DIAGNOSIS — Z8501 Personal history of malignant neoplasm of esophagus: Secondary | ICD-10-CM | POA: Insufficient documentation

## 2016-12-14 DIAGNOSIS — Z87891 Personal history of nicotine dependence: Secondary | ICD-10-CM | POA: Insufficient documentation

## 2016-12-14 DIAGNOSIS — J449 Chronic obstructive pulmonary disease, unspecified: Secondary | ICD-10-CM | POA: Diagnosis not present

## 2016-12-14 DIAGNOSIS — R109 Unspecified abdominal pain: Secondary | ICD-10-CM | POA: Diagnosis present

## 2016-12-14 HISTORY — DX: Malignant (primary) neoplasm, unspecified: C80.1

## 2016-12-14 LAB — COMPREHENSIVE METABOLIC PANEL
ALBUMIN: 4.1 g/dL (ref 3.5–5.0)
ALK PHOS: 89 U/L (ref 38–126)
ALT: 39 U/L (ref 17–63)
ANION GAP: 13 (ref 5–15)
AST: 28 U/L (ref 15–41)
BUN: 21 mg/dL — AB (ref 6–20)
CALCIUM: 9.6 mg/dL (ref 8.9–10.3)
CO2: 23 mmol/L (ref 22–32)
Chloride: 98 mmol/L — ABNORMAL LOW (ref 101–111)
Creatinine, Ser: 0.68 mg/dL (ref 0.61–1.24)
GFR calc Af Amer: >60 mL/min (ref >60)
GLUCOSE: 99 mg/dL (ref 65–99)
POTASSIUM: 4 mmol/L (ref 3.5–5.1)
Sodium: 134 mmol/L — ABNORMAL LOW (ref 135–145)
Total Bilirubin: 0.9 mg/dL (ref 0.3–1.2)
Total Protein: 8.3 g/dL — ABNORMAL HIGH (ref 6.5–8.1)

## 2016-12-14 LAB — CBC
HEMATOCRIT: 42.5 % (ref 39.0–52.0)
Hemoglobin: 15.2 g/dL (ref 13.0–17.0)
MCH: 31.1 pg (ref 26.0–34.0)
MCHC: 35.8 g/dL (ref 30.0–36.0)
MCV: 87.1 fL (ref 78.0–100.0)
Platelets: 152 10*3/uL (ref 150–400)
RBC: 4.88 MIL/uL (ref 4.22–5.81)
RDW: 15.9 % — AB (ref 11.5–15.5)
WBC: 2.5 10*3/uL — AB (ref 4.0–10.5)

## 2016-12-14 LAB — URINALYSIS, ROUTINE W REFLEX MICROSCOPIC
Bacteria, UA: NONE SEEN
Bilirubin Urine: NEGATIVE
GLUCOSE, UA: NEGATIVE mg/dL
KETONES UR: 80 mg/dL — AB
Leukocytes, UA: NEGATIVE
NITRITE: NEGATIVE
PH: 5 (ref 5.0–8.0)
PROTEIN: NEGATIVE mg/dL
Specific Gravity, Urine: >1.046 — ABNORMAL HIGH (ref 1.005–1.030)
Squamous Epithelial / LPF: NONE SEEN

## 2016-12-14 LAB — LIPASE, BLOOD: Lipase: 24 U/L (ref 11–51)

## 2016-12-14 MED ORDER — HEPARIN SOD (PORK) LOCK FLUSH 100 UNIT/ML IV SOLN
500.0000 [IU] | Freq: Once | INTRAVENOUS | Status: AC
  Administered 2016-12-14: 500 [IU]
  Filled 2016-12-14: qty 5

## 2016-12-14 MED ORDER — SODIUM CHLORIDE 0.9 % IV BOLUS (SEPSIS)
500.0000 mL | Freq: Once | INTRAVENOUS | Status: AC
  Administered 2016-12-14: 500 mL via INTRAVENOUS

## 2016-12-14 MED ORDER — OXYCODONE-ACETAMINOPHEN 5-325 MG PO TABS: 1.0000 | ORAL_TABLET | ORAL | 0 refills | Status: DC | PRN

## 2016-12-14 MED ORDER — MORPHINE SULFATE (PF) 4 MG/ML IV SOLN
4.0000 mg | Freq: Once | INTRAVENOUS | Status: AC
  Administered 2016-12-14: 4 mg via INTRAVENOUS
  Filled 2016-12-14: qty 1

## 2016-12-14 MED ORDER — IOPAMIDOL (ISOVUE-300) INJECTION 61%
100.0000 mL | Freq: Once | INTRAVENOUS | Status: AC | PRN
  Administered 2016-12-14: 100 mL via INTRAVENOUS

## 2016-12-14 MED ORDER — IOPAMIDOL (ISOVUE-300) INJECTION 61%
INTRAVENOUS | Status: AC
  Filled 2016-12-14: qty 100

## 2016-12-14 MED ORDER — SUCRALFATE 1 GM/10ML PO SUSP: 1.0000 g | Freq: Three times a day (TID) | ORAL | 0 refills | Status: DC

## 2016-12-14 NOTE — Discharge Instructions (Signed)

## 2016-12-14 NOTE — ED Notes (Signed)
Patient informed for need of urine 

## 2016-12-14 NOTE — ED Notes (Signed)
Bed: UE59 Expected date:  Expected time:  Means of arrival:  Comments: Triage
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
## 2016-12-14 NOTE — ED Triage Notes (Signed)
Per pt, states he has been unable to eat since last Thursday-last BM was last Thursday-states history of cancer-last radiation was last week-states some vomiting-complaining of lower abdominal pain radiating to back-states he takes Morphine for pain-

## 2016-12-14 NOTE — ED Notes (Signed)
Patient is alert and oriented x3.  He was given DC instructions and follow up visit instructions.  Patient gave verbal understanding.  He was DC ambulatory under his own power to home.  V/S stable.  He was not showing any signs of distress on DC 

## 2016-12-15 ENCOUNTER — Telehealth: Payer: Self-pay | Admitting: Radiation Oncology

## 2016-12-15 ENCOUNTER — Other Ambulatory Visit: Payer: Self-pay | Admitting: *Deleted

## 2016-12-15 DIAGNOSIS — E860 Dehydration: Secondary | ICD-10-CM

## 2016-12-15 NOTE — Progress Notes (Signed)
[place]  [phone] [phone]  Clinic Follow-up Note   Patient Care Team: [name], MD as PCP - General (Family Medicine) 12/18/2016   CHIEF COMPLAINTS:  Follow up esophageal adenocarcinoma   Oncology History   Cancer Staging Esophageal cancer [facility]) Staging form: Esophagus - Adenocarcinoma, AJCC 8th Edition - Clinical stage from 10/26/2016: Stage III (cT3, cN1, cM0) - Signed by [name], MD on 11/05/2016       Malignant neoplasm of lower third of esophagus ([place])   09/21/2016 - 09/21/2016 Hospital Admission    esophageal pain and vomiting up blood      10/09/2016 Initial Diagnosis    Esophageal cancer ([place])      10/09/2016 Procedure    EGD 1. Partially obstructing, likely malignant esophageal tumor was found in the lower third of the esophagus. Multiple biopsies.  2. Mass visible during gastric retroflexion at GE junction 3. Otherwise normal stomach 4. Normal examined duodenum       10/09/2016 Pathology Results    Esophagus, biopsy, distal esophageal tumor (33-39) - SUSPICIOUS FOR ADENOCARCINOMA      10/12/2016 Imaging    CT CAP w Contrast IMPRESSION: Distal esophageal mass compatible with primary esophageal malignancy. There are 2 adjacent abnormal appearing subcentimeter paraesophageal lymph nodes which may represent nodal metastasis. Additionally there is a prominent nonspecific 8 mm upper abdominal lymph node.  No evidence for distant metastatic disease in the chest, abdomen or pelvis.      10/24/2016 PET scan    1. Markedly hypermetabolic distal esophageal lesion, compatible with malignancy. Adjacent small paraesophageal lymph nodes are abnormal by CT but cannot be resolved as separate structures from the hypermetabolic esophageal activity on the PET images. No hypermetabolism is demonstrated in the upper abdominal/gastrohepatic ligament lymph node although the small size of this lymph node may be below  threshold for detection on PET imaging. 2. No evidence for distant hypermetabolic metastatic disease in the neck, chest, abdomen, or pelvis.      10/26/2016 Pathology Results    Esophagogastric junction, biopsy, mass - INVASIVE ADENOCARCINOMA.      10/26/2016 Procedure    EUS showed uT3N1 disease, and biopsy confirmed adenocarcinoma       10/30/2016 - 12/07/2016 Radiation Therapy    Neoadjuvant radiation to esophageal cancer  Under the care of Dr. [name]      10/31/2016 - 12/07/2016 Neo-Adjuvant Chemotherapy    Weekly Carboplatin AUC 2 and taxol 50mg /m2 with concurrent radiation        12/14/2016 Imaging    CT AP W Contrast 12/14/16 IMPRESSION: 1. Mildly dilated short segment of proximal jejunum without bowel wall thickening or significant inflammatory changes. Findings may represent focal ileus or radiation enteritis. No evidence for obstruction. 2. Significantly improved appearance of the distal esophagus/proximal stomach with decreased wall thickening (1.1 cm from 2.3 cm), and smaller paraesophageal and gastrohepatic ligament lymph nodes. 3. No bowel obstruction.  Normal appendix.      HISTORY OF PRESENTING ILLNESS (10/16/2016):  Darrell Cunningham 56 y.o. male is here because of an esophageal mass that is suspicious for adenocarcinoma. The pt presented to the ED on 09/21/2016 for esophageal pain and vomiting up blood. He had been struggling with regurgitating food or drinks, difficulty swallowing, and throat pain for 3 weeks prior. He was referred by his PCP to GI who ordered him an EGD with dilation with Dr. [name] as soon as possible. The EGD and biopsy was performed on 10/09/2016, which showed suspicion of  adenocarcinoma of the esophagus and a CT scan was ordered. The CT results demonstrated a distal esophageal mass compatible with primary esophageal malignancy. He is here to discuss treatment.   When he has trouble swallowing, he has to stand up and push his head back to get the  food down. He has mostly eating oatmeal, Ensure, and other soft foods. He drinks 2-3 Ensure a day. He has cut out coffee and acidic foods to try to relieve his pain. It is not getting better, though. He isn't able to eat much due to this pain and difficulty swallowing. He does not take any pain medication. He has lost about 13 pounds. He also has some left sided abdominal pain. At night, he has a lot of acid reflux and sputum production. Denies constipation, diarrhea, nausea, SOB, or any other concerns.   He has a history of cerebral palsy on his right side. He has some back pain due to a curvature in his spine. History of asthma. His grandmother had breast cancer and colon cancer. His sister passed of breast cancer at age 69. His aunt had ovarian cancer. No other family history of cancer. He quit smoking 4 years ago. 38 pack years, 1-1.5 ppd. He lives at home with his 44 y.o. Daughter. His parents live about 15 minutes away.   CURRENT THERAPY: pending surgery   INTERVAL HISTORY:   The patient returns for follow up. Darrell Cunningham presents to the clinic today and reports to having 5 BM over the weekend. He took two pills along with Miralax. He is eating as much as possible. He is eating regular food and he has pain when swallowing across his chest. He is taking ensure 2 a day.  With different temperatures in food he gets pain down his back.   Daughter graduation in June on th 14th and a party that following weekend. Will see Dr. Pia Mau on the 31st of May   MEDICAL HISTORY:  Past Medical History:  Diagnosis Date  . Allergy   . Arthritis   . Asthma    as a child  . Cancer ([place])   . 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 Dysphagia   . Encounter for nonprocreative genetic counseling 10/31/2016   Mr. Peace underwent genetic counseling for hereditary cancer syndromes on 10/31/2016. Though he is a candidate for genetic testing, he declines at this time.  . Hyperlipidemia   . Neuromuscular  disorder ([place])   . Pneumonia 4 yrs ago    SURGICAL HISTORY: Past Surgical History:  Procedure Laterality Date  . EUS N/A 10/26/2016   Procedure: UPPER ENDOSCOPIC ULTRASOUND (EUS) LINEAR;  Surgeon: [name], MD;  Location: [facility] ENDOSCOPY;  Service: Endoscopy;  Laterality: N/A;  . EYE SURGERY Bilateral age 32    for cross eyes  . IR FLUORO GUIDE PORT INSERTION RIGHT  11/08/2016  . IR US GUIDE VASC ACCESS RIGHT  11/08/2016  . MOUTH SURGERY      SOCIAL HISTORY: Social History   Social History  . Marital status: Legally Separated    Spouse name: N/A  . Number of children: N/A  . Years of education: N/A   Occupational History  . Not on file.   Social History Main Topics  . Smoking status: Former Smoker    Packs/day: 1.00    Years: 38.00    Types: Cigarettes    Start date: 10/31/2012    Quit date: 10/31/2012  . Smokeless tobacco: Never Used  .  Alcohol use No  . Drug use: No  . Sexual activity: Not on file   Other Topics Concern  . Not on file   Social History Narrative  . No narrative on file    FAMILY HISTORY: Family History  Problem Relation Age of Onset  . Colon cancer Maternal Grandmother 70  . Breast cancer Maternal Grandmother 74  . Breast cancer Sister 66       Deceased at 64 of breast cancer  . Ovarian cancer Maternal Aunt   . Stomach cancer Neg Hx   . Esophageal cancer Neg Hx     ALLERGIES:  is allergic to penicillins.  MEDICATIONS:  Current Outpatient Prescriptions  Medication Sig Dispense Refill  . fluticasone (FLONASE) 50 MCG/ACT nasal spray Place 1 spray into both nostrils daily as needed for allergies or rhinitis.    Marland Kitchen lidocaine-prilocaine (EMLA) cream Apply 1 application topically as needed. Apply to portacath  1 1/2 hours - 2 hours prior to procedures as needed. 30 g 1  . magic mouthwash w/lidocaine SOLN Take 5 mLs by mouth 4 (four) times daily as needed for mouth pain. Take 5 ml by mouth - swish and swallow  OR swish and spit -  4 times daily  as needed for mouth pain. 240 mL 0  . ondansetron (ZOFRAN) 8 MG tablet Take 1 tablet (8 mg total) by mouth 2 (two) times daily as needed for refractory nausea / vomiting. Start on day 3 after chemo. 30 tablet 1  . oxyCODONE-acetaminophen (PERCOCET/ROXICET) 5-325 MG tablet Take 1 tablet by mouth every 4 (four) hours as needed for severe pain. 15 tablet 0  . pantoprazole (PROTONIX) 40 MG tablet Take 1 tablet (40 mg total) by mouth 2 (two) times daily. 60 tablet 3  . PROAIR HFA 108 (90 Base) MCG/ACT inhaler INHALE 2 PUFFS INTO THE LUNGS EVERY 6 (SIX) HOURS AS NEEDED FOR WHEEZING OR SHORTNESS OF BREATH. 18 g 0  . prochlorperazine (COMPAZINE) 10 MG tablet Take 1 tablet (10 mg total) by mouth every 6 (six) hours as needed (Nausea or vomiting). 30 tablet 1  . sucralfate (CARAFATE) 1 GM/10ML suspension Take 10 mLs (1 g total) by mouth 4 (four) times daily -  with meals and at bedtime. 420 mL 0  . tiZANidine (ZANAFLEX) 4 MG tablet TAKE 1 TABLET (4 MG TOTAL) BY MOUTH EVERY 6 (SIX) HOURS AS NEEDED FOR MUSCLE SPASMS. 30 tablet 0   No current facility-administered medications for this visit.    REVIEW OF SYSTEMS:  Constitutional: Denies fevers, chills or abnormal night sweats (+) weight improved (+) fair appetite Eyes: Denies blurriness of vision, double vision or watery eyes Ears, nose, mouth, throat, and face: Denies mucositis or sore throat (+) throat pain (+) trouble swallowing solids Respiratory: Denies cough, dyspnea or wheezes  Cardiovascular: Denies palpitation, chest discomfort or lower extremity swelling Gastrointestinal:  Denies nausea, heartburn or change in bowel habits (+) abdominal pain (+) constipation impreoved Skin: Denies abnormal skin rashes (+) right arm red and swollen (+) itching  Lymphatics: Denies new lymphadenopathy or easy bruising Neurological:Denies numbness, tingling or new weaknesses  Musculoskeletal: Negative Behavioral/Psych: Mood is stable, no new changes (+) insomnia  All  other systems were reviewed with the patient and are negative.  PHYSICAL EXAMINATION: ECOG PERFORMANCE STATUS: 1 - Symptomatic but completely ambulatory  Vitals:   12/18/16 1054  BP: (!) 138/122  Pulse: 97  Resp: 20  Temp: 97.7 F (36.5 C)   Filed Weights   12/18/16 1054  Weight: 133 lb 14.4 oz (60.7 kg)    GENERAL:alert, no distress and comfortable (+) limp from right sided cerebral palsy (+) weight loss of about 2 lbs since 10/19/16 SKIN: skin color, texture, turgor are normal, no rashes or significant lesions EYES: normal, conjunctiva are pink and non-injected, sclera clear OROPHARYNX:no exudate, no erythema and lips, buccal mucosa, and tongue normal  NECK: supple, thyroid normal size, non-tender, without nodularity LYMPH:  no palpable lymphadenopathy in the cervical, axillary or inguinal LUNGS: clear to auscultation and percussion with normal breathing effort (+) wheezing right lung HEART: regular rate & rhythm and no murmurs and no lower extremity edema ABDOMEN:abdomen soft, non-tender and normal bowel sounds (+) tenderness  Musculoskeletal:no cyanosis of digits and no clubbing  PSYCH: alert & oriented x 3 with fluent speech NEURO: no focal motor/sensory deficits  LABORATORY DATA:  I have reviewed the data as listed CBC Latest Ref Rng & Units 12/18/2016 12/14/2016 12/04/2016  WBC 4.0 - 10.3 10e3/uL 1.4(L) 2.5(L) 3.0(L)  Hemoglobin 13.0 - 17.1 g/dL 11.3(L) 15.2 13.3  Hematocrit 38.4 - 49.9 % 32.8(L) 42.5 38.3(L)  Platelets 140 - 400 10e3/uL 127(L) 152 119(L)   CMP Latest Ref Rng & Units 12/18/2016 12/14/2016 12/04/2016  Glucose 70 - 140 mg/dl 95 99 96  BUN 7.0 - 26.0 mg/dL 3.2(L) 21(H) 9.7  Creatinine 0.7 - 1.3 mg/dL 0.6(L) 0.68 0.7  Sodium 136 - 145 mEq/L 138 134(L) 142  Potassium 3.5 - 5.1 mEq/L 3.6 4.0 3.8  Chloride 101 - 111 mmol/L - 98(L) -  CO2 22 - 29 mEq/L 25 23 26   Calcium 8.4 - 10.4 mg/dL 9.0 9.6 9.4  Total Protein 6.4 - 8.3 g/dL 6.2(L) 8.3(H) 7.0  Total  Bilirubin 0.20 - 1.20 mg/dL 0.39 0.9 0.53  Alkaline Phos 40 - 150 U/L 81 89 70  AST 5 - 34 U/L 22 28 22   ALT 0 - 55 U/L 29 39 41   PATHOLOGY:  Diagnosis 10/26/2016 Esophagogastric junction, biopsy, mass - INVASIVE ADENOCARCINOMA.  Diagnosis 10/09/2016 Esophagus, biopsy, distal esophageal tumor (33-39) - SUSPICIOUS FOR ADENOCARCINOMA, SEE COMMENT.Marland Kitchen  RADIOGRAPHIC STUDIES: I have personally reviewed the radiological images as listed and agreed with the findings in the report.  CT AP W Contrast 12/14/16 IMPRESSION: 1. Mildly dilated short segment of proximal jejunum without bowel wall thickening or significant inflammatory changes. Findings may represent focal ileus or radiation enteritis. No evidence for obstruction. 2. Significantly improved appearance of the distal esophagus/proximal stomach with decreased wall thickening (1.1 cm from 2.3 cm), and smaller paraesophageal and gastrohepatic ligament lymph nodes. 3. No bowel obstruction.  Normal appendix.  PET 10/24/2016 IMPRESSION: 1. Markedly hypermetabolic distal esophageal lesion, compatible with malignancy. Adjacent small paraesophageal lymph nodes are abnormal by CT but cannot be resolved as separate structures from the hypermetabolic esophageal activity on the PET images. No hypermetabolism is demonstrated in the upper abdominal/gastrohepatic ligament lymph node although the small size of this lymph node may be below threshold for detection on PET imaging. 2. No evidence for distant hypermetabolic metastatic disease in the neck, chest, abdomen, or pelvis.  CT CAP w Contrast 10/12/2016 IMPRESSION: Distal esophageal mass compatible with primary esophageal malignancy. There are 2 adjacent abnormal appearing subcentimeter paraesophageal lymph nodes which may represent nodal metastasis. Additionally there is a prominent nonspecific 8 mm upper abdominal lymph node.  No evidence for distant metastatic disease in the chest,  abdomen or pelvis.  EGD 10/09/2016   ASSESSMENT & PLAN:  Darrell Cunningham is a 56 y.o. Caucasian male with  PMH cerebral palsy and heavy smoking, who presents with dysphagia, odynophagia and weight loss   1. Low esophageal Adenocarcinoma, cT3N1M0, stage III -I previously reviewed the imaging and biopsy pathology results with the patient in detail. Although his biopsy pathology was not definitive for malignancy, based on his endoscopy findings, this is most consistent with esophageal adenocarcinoma. -I previously reviewed his PET scan images with patient in person, which showed intense hypermetabolic esophageal cancer, no other distant metastasis -I reviewed the EUS and re-biopsy results, which showed T3N1 disease, and biopsy confirmed adenocarcinoma -We previously reviewed the standard care for locally advanced esophageal cancer, including neoadjuvant chemoradiation, followed by esophagectomy.  -He was previously referred to see thoracic surgeon Dr. Pia Mau. -He has previously started concurrent chemoradiation with weekly carbo and Taxol, tolerating well overall  -previously his dysphagia has slightly improved -Email has developed odynophagia, likely from radiation and chemotherapy, I'll call in magic mouthwash today. -Repeat Today, if adequate, will proceed with last cycle chemotherapy tomorrow. He is finishing radiation in 2 days. -I previously encouraged him to drink plenty of water, eat soft foods, and eat slowly. Follow up with dietitian  -Continue using zofran and compazine as needed  -CT abdomen scan reviewed and showed good response in shrinking of tumor.  -repeat CT chest scan before surgery -Labs reviewed and White count still has not recovered. He is slightly anemic. Kidney liver function is good.   -encouraged him to be cautious of crowds and infection -he is recovering from chemoRT, is going to see Dr. Pia Mau on May 31 to discuss surgery    2. Dysphagia, odynophagia and weight  loss -Difficulty eating due to esophageal pain -He can eat soft foods, which occasionally get stuck -Drinks 3 Ensure a day. I previously encouraged 4-5 a day.  -He will f/u with our dietician -previously His symptoms has slightly improved since he started the chemoradiation, he is tolerating treatment well. -few days after Radiation he experiences burning in his throat as well  -Will prescribe magic mouth wash to help with pain before eating -He has lost about 23 pounds since he started treatment, he will follow-up with dietitian tomorrow. -Weight slightly improved along with appetite.  -I encouraged him to increase ensure from 2 bottles to 3 bottles a day.  -Pain with certain temperatures in food/drinks due to radiation -will give IVF today, he is doing better than last week   3. History of heavy smoking -He has quit smoking completely, history of 40 pack year.   4. Constipation -I previously encouraged the use of MiraLax daily or more if needed  -This may get worse due to morphine use  -He had 5 BM over the past weekend. Will continue to monitor.    Plan: -lab  And f/u in 6/13  -1L NS today   All questions were answered. The patient knows to call the clinic with any problems, questions or concerns.  I spent 20 minutes counseling the patient face to face. The total time spent in the appointment was 25 minutes and more than 50% was on counseling.  This document serves as a record of services personally performed by [name], MD. It was created on her behalf by Joslyn Devon, a trained medical scribe. The creation of this record is based on the scribe's personal observations and the provider's statements to them. This document has been checked and approved by the attending provider. .  I have reviewed the above documentation for accuracy and completeness and I agree with the  above.   [name], MD 12/18/2016

## 2016-12-15 NOTE — Telephone Encounter (Deleted)
-----   Message from Kerri Perches sent at 12/14/2016 11:29 AM EDT ----- Regarding: PHONE CALL  Hi Darrell Cunningham,   This patient called and he wants you to give him a return call.  Darrell Cunningham phone number is [phone].  Thanks,  [employer]

## 2016-12-15 NOTE — Telephone Encounter (Signed)
He received IVF yesterday. He seems to be able to keep some liquids down for now, but because of the dilitation, I suggested he not push it.

## 2016-12-15 NOTE — Telephone Encounter (Signed)
I spoke with the patient and he went to the ED after no BM x 6 days with abdominal pain and nausea/vomiting. He had a CT abdomen/pelvis that revealed dilatation of the proximal jejunum. He was sent home after IVF and supportive care. I spoke with him today as he had called to let us know what was going on. He sees Dr. Burr Medico on Monday. We discussed that this small bowel inflammation is likely due to recent radiotherapy and should improve with supportive care, and trying to limit oral intake. I suggested he only sip on clear liquids and use his antiemetics and pain medication as needed. He will return to the ED if his pain is worse, has feculent or intractable emesis, or if he stops passing gas.

## 2016-12-15 NOTE — Telephone Encounter (Signed)
Bryson Ha, thanks for the note. Does he need ivf today?  Thu, please request 2hr IVF for him on Monday after his visit. Thanks   [name] MD

## 2016-12-16 ENCOUNTER — Telehealth: Payer: Self-pay

## 2016-12-16 ENCOUNTER — Ambulatory Visit (HOSPITAL_BASED_OUTPATIENT_CLINIC_OR_DEPARTMENT_OTHER): Payer: Medicaid Other

## 2016-12-16 DIAGNOSIS — E860 Dehydration: Secondary | ICD-10-CM

## 2016-12-16 MED ORDER — SODIUM CHLORIDE 0.9 % IV SOLN
Freq: Once | INTRAVENOUS | Status: AC
  Administered 2016-12-16: 09:00:00 via INTRAVENOUS

## 2016-12-16 MED ORDER — HEPARIN SOD (PORK) LOCK FLUSH 100 UNIT/ML IV SOLN
500.0000 [IU] | Freq: Once | INTRAVENOUS | Status: AC
  Administered 2016-12-16: 500 [IU]
  Filled 2016-12-16: qty 5

## 2016-12-16 MED ORDER — SODIUM CHLORIDE 0.9 % IJ SOLN
10.0000 mL | Freq: Once | INTRAMUSCULAR | Status: AC
  Administered 2016-12-16: 10 mL
  Filled 2016-12-16: qty 10

## 2016-12-16 NOTE — Patient Instructions (Signed)
Dehydration, Adult Dehydration is when there is not enough fluid or water in your body. This happens when you lose more fluids than you take in. Dehydration can range from mild to very bad. It should be treated right away to keep it from getting very bad. Symptoms of mild dehydration may include:   Thirst.  Dry lips.  Slightly dry mouth.  Dry, warm skin.  Dizziness. Symptoms of moderate dehydration may include:   Very dry mouth.  Muscle cramps.  Dark pee (urine). Pee may be the color of tea.  Your body making less pee.  Your eyes making fewer tears.  Heartbeat that is uneven or faster than normal (palpitations).  Headache.  Light-headedness, especially when you stand up from sitting.  Fainting (syncope). Symptoms of very bad dehydration may include:   Changes in skin, such as:  Cold and clammy skin.  Blotchy (mottled) or pale skin.  Skin that does not quickly return to normal after being lightly pinched and let go (poor skin turgor).  Changes in body fluids, such as:  Feeling very thirsty.  Your eyes making fewer tears.  Not sweating when body temperature is high, such as in hot weather.  Your body making very little pee.  Changes in vital signs, such as:  Weak pulse.  Pulse that is more than 100 beats a minute when you are sitting still.  Fast breathing.  Low blood pressure.  Other changes, such as:  Sunken eyes.  Cold hands and feet.  Confusion.  Lack of energy (lethargy).  Trouble waking up from sleep.  Short-term weight loss.  Unconsciousness. Follow these instructions at home:  If told by your doctor, drink an ORS:  Make an ORS by using instructions on the package.  Start by drinking small amounts, about  cup (120 mL) every 5-10 minutes.  Slowly drink more until you have had the amount that your doctor said to have.  Drink enough clear fluid to keep your pee clear or pale yellow. If you were told to drink an ORS, finish the  ORS first, then start slowly drinking clear fluids. Drink fluids such as:  Water. Do not drink only water by itself. Doing that can make the salt (sodium) level in your body get too low (hyponatremia).  Ice chips.  Fruit juice that you have added water to (diluted).  Low-calorie sports drinks.  Avoid:  Alcohol.  Drinks that have a lot of sugar. These include high-calorie sports drinks, fruit juice that does not have water added, and soda.  Caffeine.  Foods that are greasy or have a lot of fat or sugar.  Take over-the-counter and prescription medicines only as told by your doctor.  Do not take salt tablets. Doing that can make the salt level in your body get too high (hypernatremia).  Eat foods that have minerals (electrolytes). Examples include bananas, oranges, potatoes, tomatoes, and spinach.  Keep all follow-up visits as told by your doctor. This is important. Contact a doctor if:  You have belly (abdominal) pain that:  Gets worse.  Stays in one area (localizes).  You have a rash.  You have a stiff neck.  You get angry or annoyed more easily than normal (irritability).  You are more sleepy than normal.  You have a harder time waking up than normal.  You feel:  Weak.  Dizzy.  Very thirsty.  You have peed (urinated) only a small amount of very dark pee during 6-8 hours. Get help right away if:  You   have symptoms of very bad dehydration.  You cannot drink fluids without throwing up (vomiting).  Your symptoms get worse with treatment.  You have a fever.  You have a very bad headache.  You are throwing up or having watery poop (diarrhea) and it:  Gets worse.  Does not go away.  You have blood or something green (bile) in your throw-up.  You have blood in your poop (stool). This may cause poop to look black and tarry.  You have not peed in 6-8 hours.  You pass out (faint).  Your heart rate when you are sitting still is more than 100 beats a  minute.  You have trouble breathing. This information is not intended to replace advice given to you by your health care provider. Make sure you discuss any questions you have with your health care provider. Document Released: 05/13/2009 Document Revised: 02/04/2016 Document Reviewed: 09/10/2015 Elsevier Interactive Patient Education  2017 [employer]  

## 2016-12-16 NOTE — Telephone Encounter (Signed)
Spoke with patient per inbox message added ivf  Ronna Herskowitz

## 2016-12-16 NOTE — Progress Notes (Signed)
Pt here for IVF.  Stated still has NO BMs yet.  Oral intake not much due to having problem with swallowing solid foods; has no problems with liquids.  Verbal and written instructions given to pt for constipation management. Pt stated he has intermittent pain on right anterior neck - along side where portacath located.  Nurse palpated with mild pressure at neck area - pt denied pain.  No swelling noted, no redness noted.  Instructed pt to monitor for any signs of swelling, increased pain, increased redness and/or tender.  If these symptoms occur between now and until pt comes in for office visit with Dr. Burr Medico on Monday 5/21,  pt needs to go to ER for further evaluation.   Pt voiced understanding.

## 2016-12-18 ENCOUNTER — Telehealth: Payer: Self-pay | Admitting: Hematology

## 2016-12-18 ENCOUNTER — Ambulatory Visit (HOSPITAL_BASED_OUTPATIENT_CLINIC_OR_DEPARTMENT_OTHER): Payer: Medicaid Other

## 2016-12-18 ENCOUNTER — Ambulatory Visit (HOSPITAL_BASED_OUTPATIENT_CLINIC_OR_DEPARTMENT_OTHER): Payer: Medicaid Other | Admitting: Hematology

## 2016-12-18 ENCOUNTER — Encounter: Payer: Self-pay | Admitting: Hematology

## 2016-12-18 ENCOUNTER — Other Ambulatory Visit (HOSPITAL_BASED_OUTPATIENT_CLINIC_OR_DEPARTMENT_OTHER): Payer: Medicaid Other

## 2016-12-18 ENCOUNTER — Ambulatory Visit: Payer: Medicaid Other

## 2016-12-18 VITALS — BP 107/67

## 2016-12-18 VITALS — BP 138/122 | HR 97 | Temp 97.7°F | Resp 20 | Ht 61.0 in | Wt 133.9 lb

## 2016-12-18 DIAGNOSIS — K5900 Constipation, unspecified: Secondary | ICD-10-CM | POA: Diagnosis not present

## 2016-12-18 DIAGNOSIS — R1310 Dysphagia, unspecified: Secondary | ICD-10-CM | POA: Diagnosis not present

## 2016-12-18 DIAGNOSIS — R634 Abnormal weight loss: Secondary | ICD-10-CM | POA: Diagnosis not present

## 2016-12-18 DIAGNOSIS — Z95828 Presence of other vascular implants and grafts: Secondary | ICD-10-CM

## 2016-12-18 DIAGNOSIS — Z87891 Personal history of nicotine dependence: Secondary | ICD-10-CM | POA: Diagnosis not present

## 2016-12-18 DIAGNOSIS — C155 Malignant neoplasm of lower third of esophagus: Secondary | ICD-10-CM

## 2016-12-18 DIAGNOSIS — J439 Emphysema, unspecified: Secondary | ICD-10-CM

## 2016-12-18 DIAGNOSIS — E860 Dehydration: Secondary | ICD-10-CM

## 2016-12-18 LAB — COMPREHENSIVE METABOLIC PANEL WITH GFR
ALT: 29 U/L (ref 0–55)
AST: 22 U/L (ref 5–34)
Albumin: 2.9 g/dL — ABNORMAL LOW (ref 3.5–5.0)
Alkaline Phosphatase: 81 U/L (ref 40–150)
Anion Gap: 9 meq/L (ref 3–11)
BUN: 3.2 mg/dL — ABNORMAL LOW (ref 7.0–26.0)
CO2: 25 meq/L (ref 22–29)
Calcium: 9 mg/dL (ref 8.4–10.4)
Chloride: 104 meq/L (ref 98–109)
Creatinine: 0.6 mg/dL — ABNORMAL LOW (ref 0.7–1.3)
EGFR: >90 ml/min/1.73 m2 (ref >90)
Glucose: 95 mg/dL (ref 70–140)
Potassium: 3.6 meq/L (ref 3.5–5.1)
Sodium: 138 meq/L (ref 136–145)
Total Bilirubin: 0.39 mg/dL (ref 0.20–1.20)
Total Protein: 6.2 g/dL — ABNORMAL LOW (ref 6.4–8.3)

## 2016-12-18 LAB — CBC WITH DIFFERENTIAL/PLATELET
BASO%: 1.4 % (ref 0.0–2.0)
Basophils Absolute: 0 10e3/uL (ref 0.0–0.1)
EOS%: 0.7 % (ref 0.0–7.0)
Eosinophils Absolute: 0 10e3/uL (ref 0.0–0.5)
HCT: 32.8 % — ABNORMAL LOW (ref 38.4–49.9)
HGB: 11.3 g/dL — ABNORMAL LOW (ref 13.0–17.1)
LYMPH%: 23.8 % (ref 14.0–49.0)
MCH: 30.1 pg (ref 27.2–33.4)
MCHC: 34.5 g/dL (ref 32.0–36.0)
MCV: 87.5 fL (ref 79.3–98.0)
MONO#: 0.3 10e3/uL (ref 0.1–0.9)
MONO%: 18.9 % — ABNORMAL HIGH (ref 0.0–14.0)
NEUT#: 0.8 10e3/uL — ABNORMAL LOW (ref 1.5–6.5)
NEUT%: 55.2 % (ref 39.0–75.0)
Platelets: 127 10e3/uL — ABNORMAL LOW (ref 140–400)
RBC: 3.75 10e6/uL — ABNORMAL LOW (ref 4.20–5.82)
RDW: 16.6 % — ABNORMAL HIGH (ref 11.0–14.6)
WBC: 1.4 10e3/uL — ABNORMAL LOW (ref 4.0–10.3)
lymph#: 0.3 10e3/uL — ABNORMAL LOW (ref 0.9–3.3)
nRBC: 0 % (ref 0–0)

## 2016-12-18 MED ORDER — SODIUM CHLORIDE 0.9% FLUSH
10.0000 mL | Freq: Once | INTRAVENOUS | Status: AC
  Administered 2016-12-18: 10 mL
  Filled 2016-12-18: qty 10

## 2016-12-18 MED ORDER — HEPARIN SOD (PORK) LOCK FLUSH 100 UNIT/ML IV SOLN
500.0000 [IU] | Freq: Once | INTRAVENOUS | Status: AC
  Administered 2016-12-18: 500 [IU]
  Filled 2016-12-18: qty 5

## 2016-12-18 MED ORDER — SODIUM CHLORIDE 0.9 % IV SOLN
Freq: Once | INTRAVENOUS | Status: AC
  Administered 2016-12-18: 11:00:00 via INTRAVENOUS

## 2016-12-18 NOTE — Patient Instructions (Signed)
Dehydration, Adult Dehydration is when there is not enough fluid or water in your body. This happens when you lose more fluids than you take in. Dehydration can range from mild to very bad. It should be treated right away to keep it from getting very bad. Symptoms of mild dehydration may include:   Thirst.  Dry lips.  Slightly dry mouth.  Dry, warm skin.  Dizziness. Symptoms of moderate dehydration may include:   Very dry mouth.  Muscle cramps.  Dark pee (urine). Pee may be the color of tea.  Your body making less pee.  Your eyes making fewer tears.  Heartbeat that is uneven or faster than normal (palpitations).  Headache.  Light-headedness, especially when you stand up from sitting.  Fainting (syncope). Symptoms of very bad dehydration may include:   Changes in skin, such as:  Cold and clammy skin.  Blotchy (mottled) or pale skin.  Skin that does not quickly return to normal after being lightly pinched and let go (poor skin turgor).  Changes in body fluids, such as:  Feeling very thirsty.  Your eyes making fewer tears.  Not sweating when body temperature is high, such as in hot weather.  Your body making very little pee.  Changes in vital signs, such as:  Weak pulse.  Pulse that is more than 100 beats a minute when you are sitting still.  Fast breathing.  Low blood pressure.  Other changes, such as:  Sunken eyes.  Cold hands and feet.  Confusion.  Lack of energy (lethargy).  Trouble waking up from sleep.  Short-term weight loss.  Unconsciousness. Follow these instructions at home:  If told by your doctor, drink an ORS:  Make an ORS by using instructions on the package.  Start by drinking small amounts, about  cup (120 mL) every 5-10 minutes.  Slowly drink more until you have had the amount that your doctor said to have.  Drink enough clear fluid to keep your pee clear or pale yellow. If you were told to drink an ORS, finish the  ORS first, then start slowly drinking clear fluids. Drink fluids such as:  Water. Do not drink only water by itself. Doing that can make the salt (sodium) level in your body get too low (hyponatremia).  Ice chips.  Fruit juice that you have added water to (diluted).  Low-calorie sports drinks.  Avoid:  Alcohol.  Drinks that have a lot of sugar. These include high-calorie sports drinks, fruit juice that does not have water added, and soda.  Caffeine.  Foods that are greasy or have a lot of fat or sugar.  Take over-the-counter and prescription medicines only as told by your doctor.  Do not take salt tablets. Doing that can make the salt level in your body get too high (hypernatremia).  Eat foods that have minerals (electrolytes). Examples include bananas, oranges, potatoes, tomatoes, and spinach.  Keep all follow-up visits as told by your doctor. This is important. Contact a doctor if:  You have belly (abdominal) pain that:  Gets worse.  Stays in one area (localizes).  You have a rash.  You have a stiff neck.  You get angry or annoyed more easily than normal (irritability).  You are more sleepy than normal.  You have a harder time waking up than normal.  You feel:  Weak.  Dizzy.  Very thirsty.  You have peed (urinated) only a small amount of very dark pee during 6-8 hours. Get help right away if:  You   have symptoms of very bad dehydration.  You cannot drink fluids without throwing up (vomiting).  Your symptoms get worse with treatment.  You have a fever.  You have a very bad headache.  You are throwing up or having watery poop (diarrhea) and it:  Gets worse.  Does not go away.  You have blood or something green (bile) in your throw-up.  You have blood in your poop (stool). This may cause poop to look black and tarry.  You have not peed in 6-8 hours.  You pass out (faint).  Your heart rate when you are sitting still is more than 100 beats a  minute.  You have trouble breathing. This information is not intended to replace advice given to you by your health care provider. Make sure you discuss any questions you have with your health care provider. Document Released: 05/13/2009 Document Revised: 02/04/2016 Document Reviewed: 09/10/2015 Elsevier Interactive Patient Education  2017 [employer]  

## 2016-12-18 NOTE — Telephone Encounter (Signed)
Gave patient avs report and appointments for June. Per YF ok for lab/fu 6/13 due to she is out of the office on 6/20 and 6/27 and patients needs appointments on wednesdays.

## 2016-12-19 NOTE — Progress Notes (Signed)
Nutrition Follow-up:  Spoke with patient via phone this am.  Patient has completed chemo and radiation therapy.  Planning to meet with surgery on 5/31 with anticipated surgery late June/early July.  Patient reports that he is still having issues with burning pain in esophagus area.  Reports he was able to eat hamburger last night, drinking ensure 2 per day.    No other symptoms mentioned  Medications: reviewed  Labs: reviewed  Anthropometrics:   Noted weight increased to 133 lb 14.4 oz from 5/17 wt of 129 lb but decreased from 137 lb 11.2 oz on 5/7.  10% weight loss from UBW of 148 lb (Feb 2018), significant  NUTRITION DIAGNOSIS: Inadequate oral intake continues   INTERVENTION:   Encouraged high calorie, high protein foods especially with upcoming surgery planned.   Continue to recommend patient increase ensure plus/boost plus to 4 per day.  Patient reports he is drinking one now.      MONITORING, EVALUATION, GOAL: Patient will tolerate adequate calories and protein to maintain current weight   NEXT VISIT: as needed  Roiza Wiedel B. Zenia Resides, [place], [place] Registered Dietitian [phone] (pager)

## 2016-12-21 ENCOUNTER — Telehealth: Payer: Self-pay | Admitting: Radiation Oncology

## 2016-12-22 NOTE — Telephone Encounter (Signed)
I called to follow up with the patient. He received IVF with Dr. Burr Medico on Monday this week. He reports he's not having any additional nausea/vomiting since our last phone call last week. He will plan to follow up with me in a few weeks for his 1 month office visit and also proceed with surgical discussion with Dr. Roxy Horseman. We can certainly see him after surgery if he'd rather, though I'm happy to see him if he'd like to come before.

## 2016-12-28 ENCOUNTER — Encounter: Payer: Medicaid Other | Admitting: Cardiothoracic Surgery

## 2016-12-28 ENCOUNTER — Encounter: Payer: Self-pay | Admitting: Cardiothoracic Surgery

## 2016-12-28 ENCOUNTER — Ambulatory Visit (INDEPENDENT_AMBULATORY_CARE_PROVIDER_SITE_OTHER): Payer: Medicaid Other | Admitting: Cardiothoracic Surgery

## 2016-12-28 VITALS — BP 101/69 | HR 94 | Resp 16 | Ht 61.0 in | Wt 136.4 lb

## 2016-12-28 DIAGNOSIS — C159 Malignant neoplasm of esophagus, unspecified: Secondary | ICD-10-CM

## 2016-12-28 DIAGNOSIS — Z923 Personal history of irradiation: Secondary | ICD-10-CM

## 2016-12-28 DIAGNOSIS — Z9221 Personal history of antineoplastic chemotherapy: Secondary | ICD-10-CM | POA: Diagnosis not present

## 2016-12-28 NOTE — Progress Notes (Signed)
[address]       [place] [address]             [phone]                    Darrell Cunningham [place] Medical Record #[id number] Date of Birth: Apr 08, 1961  Referring: [name], MD Primary Care: [name], MD  Chief Complaint:    Chief Complaint  Patient presents with  . Esophageal Cancer    f/u s/p chemoradiation..CT A/P 12/14/16    History of Present Illness:    Darrell Cunningham 56 y.o. male is seen in the office  today for esophageal cancer He had noted increasing difficulty with pain with swallowing and with regurgitation. Because of these symptoms he underwent upper GI endoscopy by Dr. [name].  fungating mass extending up proximally 7 cm in the distal esophagus. CT scan suggests esophageal lymph nodes and a 2.3 cm mass. Biopsy were taken suggestive of adenocarcinoma but not diagnostic. Repeat biopsy was done 3/29 [id number] Diagnosis Esophagogastric junction, biopsy, mass - INVASIVE ADENOCARCINOMA. Microscopic Comment Dr. Lyndon Code has seen this case in consultation with agreement. The findings are called to Dr. Ardis Hughs on 10/27/2016. 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 He is currently a nonsmoker having quit 4 years ago prior to that he smoked 1-2 packs per day for 38 years. He denies any alcohol intake for the last 20 years.  Patient completed chemotherapy on May 8 and radiation on May 10  Wt Readings from Last 3 Encounters:  12/28/16 136 lb 6.4 oz (61.9 kg)  12/18/16 133 lb 14.4 oz (60.7 kg)  12/14/16 129 lb 1 oz (58.5 kg)   Current Activity/ Functional Status:  Patient is independent with mobility/ambulation, transfers, ADL's, IADL's.   Zubrod Score: At the time of surgery this patient's most appropriate activity status/level should be described as: []     0    Normal activity, no symptoms [x]     1     Restricted in physical strenuous activity but ambulatory, able to do out light work []     2    Ambulatory and capable of self care, unable to do work activities, up and about               >50 % of waking hours                              []     3    Only limited self care, in bed greater than 50% of waking hours []     4    Completely disabled, no self care, confined to bed or chair []     5    Moribund   Past Medical History:  Diagnosis Date  . Allergy   . Arthritis   . Asthma    as a child  . Cancer ([place])   . CP (cerebral palsy), spastic ([place])    right  . Dysphagia   . Encounter for nonprocreative genetic counseling 10/31/2016   Darrell Cunningham underwent genetic counseling for hereditary cancer syndromes on 10/31/2016. Though he is a candidate for genetic testing, he declines at this time.  . Hyperlipidemia   . Neuromuscular disorder ([place])   . Pneumonia 4 yrs ago    Past  Surgical History:  Procedure Laterality Date  . EUS N/A 10/26/2016   Procedure: UPPER ENDOSCOPIC ULTRASOUND (EUS) LINEAR;  Surgeon: [name], MD;  Location: [facility];  Service: Endoscopy;  Laterality: N/A;  . EYE SURGERY Bilateral age 22    for cross eyes  . IR FLUORO GUIDE PORT INSERTION RIGHT  11/08/2016  . IR US GUIDE VASC ACCESS RIGHT  11/08/2016  . MOUTH SURGERY      Family History  Problem Relation Age of Onset  . Colon cancer Maternal Grandmother 19  . Breast cancer Maternal Grandmother 23  . Breast cancer Sister 72       Deceased at 69 of breast cancer  . Ovarian cancer Maternal Aunt   . Stomach cancer Neg Hx   . Esophageal cancer Neg Hx     Social History   Social History  . Marital status: Legally Separated    Spouse name: N/A  . Number of children: N/A  . Years of education: N/A   Occupational History  . Not on file.   Social History Main Topics  . Smoking status: Former Smoker    Packs/day: 1.00    Years: 38.00    Types: Cigarettes    Start date: 10/31/2012    Quit  date: 10/31/2012  . Smokeless tobacco: Never Used  . Alcohol use No  . Drug use: No  . Sexual activity: Not on file   Other Topics Concern  . Not on file   Social History Narrative  . No narrative on file    History  Smoking Status  . Former Smoker  . Packs/day: 1.00  . Years: 38.00  . Types: Cigarettes  . Start date: 10/31/2012  . Quit date: 10/31/2012  Smokeless Tobacco  . Never Used    History  Alcohol Use No     Allergies  Allergen Reactions  . Penicillins     Has patient had a PCN reaction causing immediate rash, facial/tongue/throat swelling, SOB or lightheadedness with hypotension: No Has patient had a PCN reaction causing severe rash involving mucus membranes or skin necrosis: No Has patient had a PCN reaction that required hospitalization No Has patient had a PCN reaction occurring within the last 10 years: No If all of the above answers are "NO", then may proceed with Cephalosporin use. Unknown Childhood reaction    Current Outpatient Prescriptions  Medication Sig Dispense Refill  . fluticasone (FLONASE) 50 MCG/ACT nasal spray Place 1 spray into both nostrils daily as needed for allergies or rhinitis.    Marland Kitchen lidocaine-prilocaine (EMLA) cream Apply 1 application topically as needed. Apply to portacath  1 1/2 hours - 2 hours prior to procedures as needed. 30 g 1  . magic mouthwash w/lidocaine SOLN Take 5 mLs by mouth 4 (four) times daily as needed for mouth pain. Take 5 ml by mouth - swish and swallow  OR swish and spit -  4 times daily as needed for mouth pain. 240 mL 0  . ondansetron (ZOFRAN) 8 MG tablet Take 1 tablet (8 mg total) by mouth 2 (two) times daily as needed for refractory nausea / vomiting. Start on day 3 after chemo. 30 tablet 1  . oxyCODONE-acetaminophen (PERCOCET/ROXICET) 5-325 MG tablet Take 1 tablet by mouth every 4 (four) hours as needed for severe pain. 15 tablet 0  . pantoprazole (PROTONIX) 40 MG tablet Take 1 tablet (40 mg total) by mouth 2 (two)  times daily. 60 tablet 3  . PROAIR HFA 108 (90 Base) MCG/ACT  inhaler INHALE 2 PUFFS INTO THE LUNGS EVERY 6 (SIX) HOURS AS NEEDED FOR WHEEZING OR SHORTNESS OF BREATH. 18 g 0  . prochlorperazine (COMPAZINE) 10 MG tablet Take 1 tablet (10 mg total) by mouth every 6 (six) hours as needed (Nausea or vomiting). 30 tablet 1  . sucralfate (CARAFATE) 1 GM/10ML suspension Take 10 mLs (1 g total) by mouth 4 (four) times daily -  with meals and at bedtime. 420 mL 0  . tiZANidine (ZANAFLEX) 4 MG tablet TAKE 1 TABLET (4 MG TOTAL) BY MOUTH EVERY 6 (SIX) HOURS AS NEEDED FOR MUSCLE SPASMS. 30 tablet 0   No current facility-administered medications for this visit.       Review of Systems:     Cardiac Review of Systems: Y or N  Chest Pain [ n   ]  Resting SOB [ n] Exertional SOB  [n  ]  Orthopnea Florencio.Farrier ]   Pedal Edema Florencio.Farrier   ]    Palpitations Florencio.Farrier  ] Syncope  Florencio.Farrier  ]   Presyncope [ n  ]  General Review of Systems: [Y] = yes [  ]=no Constitional: recent weight change [  ];  Wt loss over the last 3 months [   ] anorexia [  ]; fatigue [  ]; nausea [  ]; night sweats [  ]; fever [  ]; or chills [  ];          Dental: poor dentition[  ]; Last Dentist visit:   Eye : blurred vision [  ]; diplopia [   ]; vision changes [  ];  Amaurosis fugax[  ]; Resp: cough [  ];  wheezing[  ];  hemoptysis[  ]; shortness of breath[  ]; paroxysmal nocturnal dyspnea[  ]; dyspnea on exertion[  ]; or orthopnea[  ];  GI:  gallstones[  ], vomiting[  ];  dysphagia[  ]; melena[  ];  hematochezia [  ]; heartburn[  ];   Hx of  Colonoscopy[  ]; GU: kidney stones [  ]; hematuria[  ];   dysuria [  ];  nocturia[  ];  history of     obstruction [  ]; urinary frequency [  ]             Skin: rash, swelling[  ];, hair loss[  ];  peripheral edema[  ];  or itching[  ]; Musculosketetal: myalgias[  ];  joint swelling[  ];  joint erythema[  ];  joint pain[  ];  back pain[  ];  Heme/Lymph: bruising[  ];  bleeding[  ];  anemia[  ];  Neuro: TIA[  ];  headaches[  ];   stroke[  ];  vertigo[  ];  seizures[  ];   paresthesias[  ];  difficulty walking[  ];  Psych:depression[  ]; anxiety[  ];  Endocrine: diabetes[  ];  thyroid dysfunction[  ];  Immunizations: Flu up to date [  ]; Pneumococcal up to date [  ];  Other:  Physical Exam: BP 101/69 (BP Location: Right Arm, Patient Position: Sitting, Cuff Size: Normal)   Pulse 94   Resp 16   Ht 5\' 1"  (1.549 m)   Wt 136 lb 6.4 oz (61.9 kg)   SpO2 98% Comment: ON RA  BMI 25.77 kg/m   PHYSICAL EXAMINATION: General appearance: alert and cooperative Head: Normocephalic, without obvious abnormality, atraumatic Neck: no adenopathy, no carotid bruit, no JVD, supple, symmetrical, trachea midline and thyroid not enlarged, symmetric, no tenderness/mass/nodules Lymph  nodes: Cervical, supraclavicular, and axillary nodes normal. Resp: clear to auscultation bilaterally Back: symmetric, no curvature. ROM normal. No CVA tenderness. Cardio: regular rate and rhythm, S1, S2 normal, no murmur, click, rub or gallop GI: soft, non-tender; bowel sounds normal; no masses,  no organomegaly Extremities: extremities normal, atraumatic, no cyanosis or edema and Homans sign is negative, no sign of DVT Neurologic: Grossly normal  Diagnostic Studies & Laboratory data:   Diagnosis Esophagus, biopsy, distal esophageal tumor (33-39) - SUSPICIOUS FOR ADENOCARCINOMA, SEE COMMENT. Microscopic Comment The majority of fragments are reactive squamous mucosa with associated acute inflammation. There is a small fragment of atypical glandular epithelium with associated ulceration that is suspicious for adenocarcinoma. There is no background Barrett's esophagus in the glandular component. Dr. Saralyn Pilar has reviewed the case. The case was called to Dr. [name] on 10/10/2016. [name] MD Pathologist, Electronic  - A large, fungating mass with no bleeding until contact was found in the lower third of the esophagus, 33 cm from the incisors and  extending to the GE junction at 39 cm. The mass was partially obstructing and partially circumferential (involving one-half of the lumen circumference). Multiple biopsies were obtained with cold forceps for histology in a targeted manner. Findings: - The perviously described mass was also seen on retroflexion in the stomach at the gastroesophageal junction. - The exam of the stomach was otherwise normal. - The examined duodenum was normal.      EUS  Endoscopic Finding Findings: A medium-sized, fungating, very friable mass was found at the gastroesophageal junction. The 6cm long mass was partially obstructing and partially circumferential (involving two thirds of the lumen circumference). The bulk of the mass lays in the distal esophagus but it does stradle the GE junction with about 2cm of tumor below the Z line. Biopsies were taken with a cold forceps for histology. Endosonographic Finding 1. The mass above correlated with a hypoechoic and heterogenous lesion at the gastroesophageal junction. This ranges from 33 cm from the incisors and extended to 39 cm. The lesion was partially circumferential (involving two-thirds of the lumen circumference). The endosonographic borders were poorly-defined. The mass measured up to 16 mm in thickness. There was sonographic evidence suggesting invasion into the adventitia (Layer 5, UT3). 2. There was one 1.3cm round, hypoechoic, heterogeneous lymphnode adjacent to the mass. This is presumed to be malignant based on EUS criteria (uN1). 3. Limited views of pancreas, spleen, liver were all normal.    Recent Radiology Findings:   Ct Abdomen Pelvis W Contrast  Result Date: 12/14/2016 CLINICAL DATA:  Per pt, states he has been unable to eat since last Thursday-last BM was last Thursday-states history of stomach cancer dx 09/2016-last radiation/chemo was last week-states some vomiting-complaining of lower abdominal pain radiating to the back. Patient  takes morphine for pain. EXAM: CT ABDOMEN AND PELVIS WITH CONTRAST TECHNIQUE: Multidetector CT imaging of the abdomen and pelvis was performed using the standard protocol following bolus administration of intravenous contrast. CONTRAST:  133mL ISOVUE-300 IOPAMIDOL (ISOVUE-300) INJECTION 61% COMPARISON:  10/24/2016, 10/02/2016 FINDINGS: Lower chest: Heart size is normal.  Trace pericardial effusion. Hepatobiliary: No focal liver abnormality is seen. No gallstones, gallbladder wall thickening, or biliary dilatation. Pancreas: Unremarkable. No pancreatic ductal dilatation or surrounding inflammatory changes. Spleen: Normal in size without focal abnormality. Adrenals/Urinary Tract: Adrenal glands are normal in appearance. Kidneys are symmetric in enhancement and excretion. No focal renal mass or hydronephrosis. Stomach/Bowel: There has been significant improvement in the appearance of distal esophageal in proximal stomach wall  thickening. Maximum thickness measures 1.1 cm, previously 2.3 cm. Tiny gastrohepatic lymph nodes persist but are significantly smaller, largest measuring approximately 5 mm. Small paraesophageal lymph node is 5 mm on image 9 of series 2. The stomach has a normal appearance. There is mild dilatation of a short segment of proximal jejunum, measuring up to 3 cm. However remainder of the small bowel loops are normal in appearance. The appendix is well seen and has a normal appearance. Colon is normal in appearance. Vascular/Lymphatic: There is significant atherosclerotic calcification of the abdominal aorta. No aneurysm. Right common iliac lymph node is 7 mm on image 63 of series 2. Reproductive: Prostate is unremarkable. Other: Stable fat containing right inguinal hernia. No free pelvic fluid. Musculoskeletal: No acute or significant osseous findings. IMPRESSION: 1. Mildly dilated short segment of proximal jejunum without bowel wall thickening or significant inflammatory changes. Findings may  represent focal ileus or radiation enteritis. No evidence for obstruction. 2. Significantly improved appearance of the distal esophagus/proximal stomach with decreased wall thickening (1.1 cm from 2.3 cm), and smaller paraesophageal and gastrohepatic ligament lymph nodes. 3. No bowel obstruction.  Normal appendix. Electronically Signed   By: Nolon Nations M.D.   On: 12/14/2016 12:04     I have independently reviewed the above radiologic studies.  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 This will be approximately 8 weeks following the completion of his radiation and chemotherapy. June 21 0 obtain a restaging CT scan with IV and oral contrast of the chest abdomen and pelvis. If there is no barriers to proceeding with resection we'll plan surgical resection in early July.      [name] MD      [address] [place] [address] Office [phone]   Beeper [phone]  12/28/2016 6:04 PM

## 2016-12-29 ENCOUNTER — Other Ambulatory Visit: Payer: Self-pay | Admitting: *Deleted

## 2016-12-29 DIAGNOSIS — C155 Malignant neoplasm of lower third of esophagus: Secondary | ICD-10-CM

## 2016-12-29 DIAGNOSIS — Z01818 Encounter for other preprocedural examination: Secondary | ICD-10-CM

## 2016-12-29 NOTE — Addendum Note (Signed)
Addendum  created 12/29/16 1304 by Suzie Vandam, MD   Sign clinical note    

## 2017-01-01 ENCOUNTER — Encounter (HOSPITAL_COMMUNITY): Payer: Self-pay | Admitting: Emergency Medicine

## 2017-01-01 ENCOUNTER — Inpatient Hospital Stay (HOSPITAL_COMMUNITY)
Admission: EM | Admit: 2017-01-01 | Discharge: 2017-01-03 | DRG: 178 | Disposition: A | Discharge status: Home / Self Care | Payer: Medicaid Other | Attending: Internal Medicine | Admitting: Internal Medicine

## 2017-01-01 ENCOUNTER — Emergency Department (HOSPITAL_COMMUNITY): Payer: Medicaid Other

## 2017-01-01 DIAGNOSIS — Z800 Family history of malignant neoplasm of digestive organs: Secondary | ICD-10-CM

## 2017-01-01 DIAGNOSIS — Z923 Personal history of irradiation: Secondary | ICD-10-CM

## 2017-01-01 DIAGNOSIS — D6959 Other secondary thrombocytopenia: Secondary | ICD-10-CM | POA: Diagnosis present

## 2017-01-01 DIAGNOSIS — R000 Tachycardia, unspecified: Secondary | ICD-10-CM | POA: Diagnosis not present

## 2017-01-01 DIAGNOSIS — J22 Unspecified acute lower respiratory infection: Secondary | ICD-10-CM | POA: Diagnosis present

## 2017-01-01 DIAGNOSIS — C155 Malignant neoplasm of lower third of esophagus: Secondary | ICD-10-CM | POA: Diagnosis present

## 2017-01-01 DIAGNOSIS — Z880 Allergy status to penicillin: Secondary | ICD-10-CM

## 2017-01-01 DIAGNOSIS — T451X5A Adverse effect of antineoplastic and immunosuppressive drugs, initial encounter: Secondary | ICD-10-CM | POA: Diagnosis present

## 2017-01-01 DIAGNOSIS — D6481 Anemia due to antineoplastic chemotherapy: Secondary | ICD-10-CM | POA: Diagnosis present

## 2017-01-01 DIAGNOSIS — J439 Emphysema, unspecified: Secondary | ICD-10-CM | POA: Diagnosis present

## 2017-01-01 DIAGNOSIS — R6510 Systemic inflammatory response syndrome (SIRS) of non-infectious origin without acute organ dysfunction: Secondary | ICD-10-CM | POA: Diagnosis present

## 2017-01-01 DIAGNOSIS — D638 Anemia in other chronic diseases classified elsewhere: Secondary | ICD-10-CM

## 2017-01-01 DIAGNOSIS — G801 Spastic diplegic cerebral palsy: Secondary | ICD-10-CM | POA: Diagnosis present

## 2017-01-01 DIAGNOSIS — J690 Pneumonitis due to inhalation of food and vomit: Principal | ICD-10-CM | POA: Diagnosis present

## 2017-01-01 DIAGNOSIS — Z87891 Personal history of nicotine dependence: Secondary | ICD-10-CM

## 2017-01-01 DIAGNOSIS — J440 Chronic obstructive pulmonary disease with acute lower respiratory infection: Secondary | ICD-10-CM | POA: Diagnosis present

## 2017-01-01 DIAGNOSIS — Z79899 Other long term (current) drug therapy: Secondary | ICD-10-CM

## 2017-01-01 DIAGNOSIS — J189 Pneumonia, unspecified organism: Secondary | ICD-10-CM

## 2017-01-01 DIAGNOSIS — A419 Sepsis, unspecified organism: Secondary | ICD-10-CM

## 2017-01-01 LAB — PROTIME-INR
INR: 0.98
Prothrombin Time: 13 seconds (ref 11.4–15.2)

## 2017-01-01 LAB — URINALYSIS, ROUTINE W REFLEX MICROSCOPIC
Bilirubin Urine: NEGATIVE
Glucose, UA: NEGATIVE mg/dL
Hgb urine dipstick: NEGATIVE
Ketones, ur: NEGATIVE mg/dL
LEUKOCYTES UA: NEGATIVE
Nitrite: NEGATIVE
PROTEIN: NEGATIVE mg/dL
SPECIFIC GRAVITY, URINE: 1.006 (ref 1.005–1.030)
pH: 7 (ref 5.0–8.0)

## 2017-01-01 LAB — COMPREHENSIVE METABOLIC PANEL
ALK PHOS: 83 U/L (ref 38–126)
ALT: 39 U/L (ref 17–63)
ANION GAP: 9 (ref 5–15)
AST: 27 U/L (ref 15–41)
Albumin: 3.6 g/dL (ref 3.5–5.0)
BILIRUBIN TOTAL: 0.8 mg/dL (ref 0.3–1.2)
BUN: 13 mg/dL (ref 6–20)
CALCIUM: 9.2 mg/dL (ref 8.9–10.3)
CO2: 24 mmol/L (ref 22–32)
Chloride: 102 mmol/L (ref 101–111)
Creatinine, Ser: 0.6 mg/dL — ABNORMAL LOW (ref 0.61–1.24)
GFR calc non Af Amer: >60 mL/min (ref >60)
GLUCOSE: 109 mg/dL — AB (ref 65–99)
Potassium: 3.6 mmol/L (ref 3.5–5.1)
Sodium: 135 mmol/L (ref 135–145)
Total Protein: 7.4 g/dL (ref 6.5–8.1)

## 2017-01-01 LAB — CBC WITH DIFFERENTIAL/PLATELET
BASOS ABS: 0 10*3/uL (ref 0.0–0.1)
BASOS PCT: 0 %
Eosinophils Absolute: 0 10*3/uL (ref 0.0–0.7)
Eosinophils Relative: 0 %
HCT: 33.8 % — ABNORMAL LOW (ref 39.0–52.0)
HEMOGLOBIN: 11.6 g/dL — AB (ref 13.0–17.0)
LYMPHS PCT: 7 %
Lymphs Abs: 0.7 10*3/uL (ref 0.7–4.0)
MCH: 30.7 pg (ref 26.0–34.0)
MCHC: 34.3 g/dL (ref 30.0–36.0)
MCV: 89.4 fL (ref 78.0–100.0)
MONO ABS: 0.6 10*3/uL (ref 0.1–1.0)
MONOS PCT: 6 %
NEUTROS ABS: 9.1 10*3/uL — AB (ref 1.7–7.7)
NEUTROS PCT: 87 %
Platelets: 152 10*3/uL (ref 150–400)
RBC: 3.78 MIL/uL — ABNORMAL LOW (ref 4.22–5.81)
RDW: 17.1 % — ABNORMAL HIGH (ref 11.5–15.5)
WBC: 10.5 10*3/uL (ref 4.0–10.5)

## 2017-01-01 LAB — I-STAT CG4 LACTIC ACID, ED: Lactic Acid, Venous: 0.92 mmol/L (ref 0.5–1.9)

## 2017-01-01 MED ORDER — VANCOMYCIN HCL IN DEXTROSE 1-5 GM/200ML-% IV SOLN
1000.0000 mg | Freq: Once | INTRAVENOUS | Status: AC
  Administered 2017-01-01: 1000 mg via INTRAVENOUS
  Filled 2017-01-01: qty 200

## 2017-01-01 MED ORDER — SODIUM CHLORIDE 0.9 % IV BOLUS (SEPSIS)
1000.0000 mL | Freq: Once | INTRAVENOUS | Status: AC
  Administered 2017-01-01: 1000 mL via INTRAVENOUS

## 2017-01-01 MED ORDER — IPRATROPIUM-ALBUTEROL 0.5-2.5 (3) MG/3ML IN SOLN
3.0000 mL | Freq: Once | RESPIRATORY_TRACT | Status: AC
  Administered 2017-01-01: 3 mL via RESPIRATORY_TRACT
  Filled 2017-01-01: qty 3

## 2017-01-01 MED ORDER — DEXTROSE 5 % IV SOLN
2.0000 g | Freq: Once | INTRAVENOUS | Status: AC
  Administered 2017-01-01: 2 g via INTRAVENOUS
  Filled 2017-01-01: qty 2

## 2017-01-01 NOTE — Progress Notes (Signed)
[place]  [phone] Fax:([phone]) [phone]  Clinic Follow-up Note   Patient Care Team: [name], MD as PCP - General (Family Medicine) 01/10/2017   CHIEF COMPLAINTS:  Follow up esophageal adenocarcinoma   Oncology History   Cancer Staging Esophageal cancer [facility]) Staging form: Esophagus - Adenocarcinoma, AJCC 8th Edition - Clinical stage from 10/26/2016: Stage III (cT3, cN1, cM0) - Signed by [name], MD on 11/05/2016       Malignant neoplasm of lower third of esophagus ([place])   09/21/2016 - 09/21/2016 Hospital Admission    esophageal pain and vomiting up blood      10/09/2016 Initial Diagnosis    Esophageal cancer ([place])      10/09/2016 Procedure    EGD 1. Partially obstructing, likely malignant esophageal tumor was found in the lower third of the esophagus. Multiple biopsies.  2. Mass visible during gastric retroflexion at GE junction 3. Otherwise normal stomach 4. Normal examined duodenum       10/09/2016 Pathology Results    Esophagus, biopsy, distal esophageal tumor (33-39) - SUSPICIOUS FOR ADENOCARCINOMA      10/12/2016 Imaging    CT CAP w Contrast IMPRESSION: Distal esophageal mass compatible with primary esophageal malignancy. There are 2 adjacent abnormal appearing subcentimeter paraesophageal lymph nodes which may represent nodal metastasis. Additionally there is a prominent nonspecific 8 mm upper abdominal lymph node.  No evidence for distant metastatic disease in the chest, abdomen or pelvis.      10/24/2016 PET scan    1. Markedly hypermetabolic distal esophageal lesion, compatible with malignancy. Adjacent small paraesophageal lymph nodes are abnormal by CT but cannot be resolved as separate structures from the hypermetabolic esophageal activity on the PET images. No hypermetabolism is demonstrated in the upper abdominal/gastrohepatic ligament lymph node although the small size of this lymph node may be below  threshold for detection on PET imaging. 2. No evidence for distant hypermetabolic metastatic disease in the neck, chest, abdomen, or pelvis.      10/26/2016 Pathology Results    Esophagogastric junction, biopsy, mass - INVASIVE ADENOCARCINOMA.      10/26/2016 Procedure    EUS showed uT3N1 disease, and biopsy confirmed adenocarcinoma       10/30/2016 - 12/07/2016 Radiation Therapy    Neoadjuvant radiation to esophageal cancer  Under the care of Dr. [name]      10/31/2016 - 12/07/2016 Neo-Adjuvant Chemotherapy    Weekly Carboplatin AUC 2 and taxol 50mg /m2 with concurrent radiation        12/14/2016 Imaging    CT AP W Contrast 12/14/16 IMPRESSION: 1. Mildly dilated short segment of proximal jejunum without bowel wall thickening or significant inflammatory changes. Findings may represent focal ileus or radiation enteritis. No evidence for obstruction. 2. Significantly improved appearance of the distal esophagus/proximal stomach with decreased wall thickening (1.1 cm from 2.3 cm), and smaller paraesophageal and gastrohepatic ligament lymph nodes. 3. No bowel obstruction.  Normal appendix.      HISTORY OF PRESENTING ILLNESS (10/16/2016):  Darrell Cunningham 56 y.o. male is here because of an esophageal mass that is suspicious for adenocarcinoma. The pt presented to the ED on 09/21/2016 for esophageal pain and vomiting up blood. He had been struggling with regurgitating food or drinks, difficulty swallowing, and throat pain for 3 weeks prior. He was referred by his PCP to GI who ordered him an EGD with dilation with Dr. [name] as soon as possible. The EGD and biopsy was performed on 10/09/2016, which showed suspicion of  adenocarcinoma of the esophagus and a CT scan was ordered. The CT results demonstrated a distal esophageal mass compatible with primary esophageal malignancy. He is here to discuss treatment.   When he has trouble swallowing, he has to stand up and push his head back to get the  food down. He has mostly eating oatmeal, Ensure, and other soft foods. He drinks 2-3 Ensure a day. He has cut out coffee and acidic foods to try to relieve his pain. It is not getting better, though. He isn't able to eat much due to this pain and difficulty swallowing. He does not take any pain medication. He has lost about 13 pounds. He also has some left sided abdominal pain. At night, he has a lot of acid reflux and sputum production. Denies constipation, diarrhea, nausea, SOB, or any other concerns.   He has a history of cerebral palsy on his right side. He has some back pain due to a curvature in his spine. History of asthma. His grandmother had breast cancer and colon cancer. His sister passed of breast cancer at age 82. His aunt had ovarian cancer. No other family history of cancer. He quit smoking 4 years ago. 38 pack years, 1-1.5 ppd. He lives at home with his 73 y.o. Daughter. His parents live about 15 minutes away.   CURRENT THERAPY: Surgery in Early July   INTERVAL HISTORY:   The patient returns for follow up. Darrell Cunningham presents to the clinic today with his daughter.  He reports to eating more and gaining weight. He has to chew well and eat slow. He reports to getting pain back in his abdomen since gaining weight. This abdominal pain is tolerable.   He reports that 2 weeks ago he had pneumonia and was in the hospital for 2 days. He was given Prednisone and he has a few pills left to take. He now denies chest pain or SOB.   He previously say Dr. [name] and was schedule for a CT scan. He will have another follow up with him after the scan. Dr. [name] reviewed with him his surgery and feeding tube post surgery.   MEDICAL HISTORY:  Past Medical History:  Diagnosis Date  . Allergy   . Arthritis   . Asthma    as a child  . Cancer ([place])   . 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 Dysphagia   . Encounter for nonprocreative genetic counseling 10/31/2016   Mr. Urwin underwent  genetic counseling for hereditary cancer syndromes on 10/31/2016. Though he is a candidate for genetic testing, he declines at this time.  . Hyperlipidemia   . Neuromuscular disorder ([place])   . Pneumonia 4 yrs ago    SURGICAL HISTORY: Past Surgical History:  Procedure Laterality Date  . EUS N/A 10/26/2016   Procedure: UPPER ENDOSCOPIC ULTRASOUND (EUS) LINEAR;  Surgeon: [name], MD;  Location: [facility] ENDOSCOPY;  Service: Endoscopy;  Laterality: N/A;  . EYE SURGERY Bilateral age 2    for cross eyes  . IR FLUORO GUIDE PORT INSERTION RIGHT  11/08/2016  . IR US GUIDE VASC ACCESS RIGHT  11/08/2016  . MOUTH SURGERY      SOCIAL HISTORY: Social History   Social History  . Marital status: Legally Separated    Spouse name: N/A  . Number of children: N/A  . Years of education: N/A   Occupational History  . Not on file.   Social History Main Topics  . Smoking status: Former Smoker  Packs/day: 1.00    Years: 38.00    Types: Cigarettes    Start date: 10/31/2012    Quit date: 10/31/2012  . Smokeless tobacco: Never Used  . Alcohol use No  . Drug use: No  . Sexual activity: Not on file   Other Topics Concern  . Not on file   Social History Narrative  . No narrative on file    FAMILY HISTORY: Family History  Problem Relation Age of Onset  . Colon cancer Maternal Grandmother 81  . Breast cancer Maternal Grandmother 31  . Breast cancer Sister 24       Deceased at 49 of breast cancer  . Ovarian cancer Maternal Aunt   . Stomach cancer Neg Hx   . Esophageal cancer Neg Hx     ALLERGIES:  is allergic to penicillins.  MEDICATIONS:  Current Outpatient Prescriptions  Medication Sig Dispense Refill  . cefpodoxime (VANTIN) 200 MG tablet Take 1 tablet (200 mg total) by mouth every 12 (twelve) hours. 12 tablet 0  . fluticasone (FLONASE) 50 MCG/ACT nasal spray Place 1 spray into both nostrils daily as needed for allergies or rhinitis.    Marland Kitchen ipratropium-albuterol (DUONEB) 0.5-2.5 (3)  MG/3ML SOLN Take 3 mLs by nebulization every 6 (six) hours as needed. 360 mL 0  . lidocaine-prilocaine (EMLA) cream Apply 1 application topically as needed. Apply to portacath  1 1/2 hours - 2 hours prior to procedures as needed. 30 g 1  . oxyCODONE-acetaminophen (PERCOCET/ROXICET) 5-325 MG tablet Take 1 tablet by mouth every 4 (four) hours as needed for severe pain. 15 tablet 0  . pantoprazole (PROTONIX) 40 MG tablet Take 1 tablet (40 mg total) by mouth 2 (two) times daily. 60 tablet 3  . PROAIR HFA 108 (90 Base) MCG/ACT inhaler INHALE 2 PUFFS INTO THE LUNGS EVERY 6 (SIX) HOURS AS NEEDED FOR WHEEZING OR SHORTNESS OF BREATH. 18 g 0  . sucralfate (CARAFATE) 1 GM/10ML suspension Take 10 mLs (1 g total) by mouth 4 (four) times daily -  with meals and at bedtime. 420 mL 0  . tiZANidine (ZANAFLEX) 4 MG tablet TAKE 1 TABLET (4 MG TOTAL) BY MOUTH EVERY 6 (SIX) HOURS AS NEEDED FOR MUSCLE SPASMS. 30 tablet 0  . acetaminophen (TYLENOL) 325 MG tablet Take 2 tablets (650 mg total) by mouth every 6 (six) hours as needed for mild pain (or Fever >/= 101). 30 tablet 0  . ondansetron (ZOFRAN) 8 MG tablet Take 1 tablet (8 mg total) by mouth 2 (two) times daily as needed for refractory nausea / vomiting. Start on day 3 after chemo. (Patient not taking: Reported on 01/10/2017) 30 tablet 1  . prochlorperazine (COMPAZINE) 10 MG tablet Take 1 tablet (10 mg total) by mouth every 6 (six) hours as needed (Nausea or vomiting). (Patient not taking: Reported on 01/10/2017) 30 tablet 1   No current facility-administered medications for this visit.    REVIEW OF SYSTEMS:  Constitutional: Denies fevers, chills or abnormal night sweats (+) weight improved (+) fair appetite Eyes: Denies blurriness of vision, double vision or watery eyes Ears, nose, mouth, throat, and face: Denies mucositis or sore throat (+) throat pain (+) trouble swallowing solids Respiratory: Denies cough, dyspnea or wheezes  Cardiovascular: Denies palpitation,  chest discomfort or lower extremity swelling Gastrointestinal:  Denies nausea, heartburn or change in bowel habits (+) abdominal pain (+) constipation improved Skin: Denies abnormal skin rashes (+) right arm red and swollen (+) itching  Lymphatics: Denies new lymphadenopathy or easy bruising  Neurological:Denies numbness, tingling or new weaknesses  Musculoskeletal: Negative Behavioral/Psych: Mood is stable, no new changes (+) insomnia  All other systems were reviewed with the patient and are negative.  PHYSICAL EXAMINATION: ECOG PERFORMANCE STATUS: 1 - Symptomatic but completely ambulatory  Vitals:   01/10/17 1346  BP: 110/67  Pulse: (!) 106  Resp: 18  Temp: 98.4 F (36.9 C)   Filed Weights   01/10/17 1346  Weight: 146 lb (66.2 kg)    GENERAL:alert, no distress and comfortable (+) limp from right sided cerebral palsy SKIN: skin color, texture, turgor are normal, no rashes or significant lesions EYES: normal, conjunctiva are pink and non-injected, sclera clear OROPHARYNX:no exudate, no erythema and lips, buccal mucosa, and tongue normal  NECK: supple, thyroid normal size, non-tender, without nodularity LYMPH:  no palpable lymphadenopathy in the cervical, axillary or inguinal LUNGS: clear to auscultation and percussion with normal breathing effort (+) wheezing right lung HEART: regular rate & rhythm and no murmurs and no lower extremity edema ABDOMEN:abdomen soft, non-tender and normal bowel sounds (+) tenderness  Musculoskeletal:no cyanosis of digits and no clubbing  PSYCH: alert & oriented x 3 with fluent speech NEURO: no focal motor/sensory deficits  LABORATORY DATA:  I have reviewed the data as listed CBC Latest Ref Rng & Units 01/10/2017 01/02/2017 01/01/2017  WBC 4.0 - 10.3 10e3/uL 6.7 7.6 10.5  Hemoglobin 13.0 - 17.1 g/dL 11.7(L) 10.5(L) 11.6(L)  Hematocrit 38.4 - 49.9 % 36.2(L) 31.2(L) 33.8(L)  Platelets 140 - 400 10e3/uL 142 128(L) 152   CMP Latest Ref Rng & Units  01/10/2017 01/02/2017 01/01/2017  Glucose 70 - 140 mg/dl 140 101(H) 109(H)  BUN 7.0 - 26.0 mg/dL 21.1 9 13   Creatinine 0.7 - 1.3 mg/dL 0.7 0.59(L) 0.60(L)  Sodium 136 - 145 mEq/L 142 137 135  Potassium 3.5 - 5.1 mEq/L 3.4(L) 3.5 3.6  Chloride 101 - 111 mmol/L - 106 102  CO2 22 - 29 mEq/L 26 25 24   Calcium 8.4 - 10.4 mg/dL 8.9 8.7(L) 9.2  Total Protein 6.4 - 8.3 g/dL 6.3(L) - 7.4  Total Bilirubin 0.20 - 1.20 mg/dL 0.34 - 0.8  Alkaline Phos 40 - 150 U/L 82 - 83  AST 5 - 34 U/L 27 - 27  ALT 0 - 55 U/L 56(H) - 39   PATHOLOGY:  Diagnosis 10/26/2016 Esophagogastric junction, biopsy, mass - INVASIVE ADENOCARCINOMA.  Diagnosis 10/09/2016 Esophagus, biopsy, distal esophageal tumor (33-39) - SUSPICIOUS FOR ADENOCARCINOMA, SEE COMMENT.Marland Kitchen  RADIOGRAPHIC STUDIES: I have personally reviewed the radiological images as listed and agreed with the findings in the report.   CT AP W Contrast 12/14/16 IMPRESSION: 1. Mildly dilated short segment of proximal jejunum without bowel wall thickening or significant inflammatory changes. Findings may represent focal ileus or radiation enteritis. No evidence for obstruction. 2. Significantly improved appearance of the distal esophagus/proximal stomach with decreased wall thickening (1.1 cm from 2.3 cm), and smaller paraesophageal and gastrohepatic ligament lymph nodes. 3. No bowel obstruction.  Normal appendix.  PET 10/24/2016 IMPRESSION: 1. Markedly hypermetabolic distal esophageal lesion, compatible with malignancy. Adjacent small paraesophageal lymph nodes are abnormal by CT but cannot be resolved as separate structures from the hypermetabolic esophageal activity on the PET images. No hypermetabolism is demonstrated in the upper abdominal/gastrohepatic ligament lymph node although the small size of this lymph node may be below threshold for detection on PET imaging. 2. No evidence for distant hypermetabolic metastatic disease in the neck, chest, abdomen,  or pelvis.  CT CAP w Contrast 10/12/2016 IMPRESSION: Distal esophageal  mass compatible with primary esophageal malignancy. There are 2 adjacent abnormal appearing subcentimeter paraesophageal lymph nodes which may represent nodal metastasis. Additionally there is a prominent nonspecific 8 mm upper abdominal lymph node.  No evidence for distant metastatic disease in the chest, abdomen or pelvis.  EGD 10/09/2016   ASSESSMENT & PLAN:  Darrell Cunningham is a 56 y.o. Caucasian male with PMH cerebral palsy and heavy smoking, who presents with dysphagia, odynophagia and weight loss   1. Low esophageal Adenocarcinoma, cT3N1M0, stage III -I previously reviewed the imaging and biopsy pathology results with the patient in detail. Although his biopsy pathology was not definitive for malignancy, based on his endoscopy findings, this is most consistent with esophageal adenocarcinoma. -I previously reviewed his PET scan images with patient in person, which showed intense hypermetabolic esophageal cancer, no other distant metastasis -I reviewed the EUS and re-biopsy results, which showed T3N1 disease, and biopsy confirmed adenocarcinoma -We previously reviewed the standard care for locally advanced esophageal cancer, including neoadjuvant chemoradiation, followed by esophagectomy.  -He was previously referred to see thoracic surgeon Dr. Pia Mau. -He has completed neoadjuvant concurrent chemoradiation with weekly carbo and Taxol, tolerated well overall  -His dysphagia has significantly improved, odynophagia is resolving -12/14/16 CT abdomen scan reviewed and showed good response in shrinking of tumor.  -He is scheduled to have a repeat scan on June 21, before surgery. -labs reviewed and anemia improved since his hospitalization and potassium is slightly low. I encouraged him to eat more bananna in his diet.  -His surgery will be in early July. I encouraged his to continue gaining weight pre-surgery to get his  energy up. Will repeat scan after surgery for monitoring.  -We discussed that post surgery we will monitor him with scans every 6 months for a few years then yearly for a total of 5 years   2. Dysphagia, odynophagia and weight loss -Much improved, he is eating well, off pain medication  3. History of heavy smoking -He has quit smoking completely, history of 40 pack year.   4. Constipation -I previously encouraged the use of MiraLax daily or more if needed  - Will continue to monitor his BM   Plan: -Surgery with Dr [name] in Early July, he is scheduled to have a restaging scan on 01/18/2017. -Labs and f/u in 2 months      All questions were answered. The patient knows to call the clinic with any problems, questions or concerns.  I spent 20 minutes counseling the patient face to face. The total time spent in the appointment was 25 minutes and more than 50% was on counseling.  This document serves as a record of services personally performed by [name], MD. It was created on her behalf by Joslyn Devon, a trained medical scribe. The creation of this record is based on the scribe's personal observations and the provider's statements to them. This document has been checked and approved by the attending provider. .  I have reviewed the above documentation for accuracy and completeness and I agree with the above.   [name], MD 01/10/2017

## 2017-01-01 NOTE — ED Notes (Signed)
Pt aware we need urine sample.  

## 2017-01-01 NOTE — ED Triage Notes (Signed)
Patient c/o congestion since yesterday and fever, chills, and body aches since this afternoon. Hx carcinoma. Last chemo and radiation x3 weeks ago.
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
## 2017-01-02 ENCOUNTER — Encounter (HOSPITAL_COMMUNITY): Payer: Self-pay | Admitting: Internal Medicine

## 2017-01-02 DIAGNOSIS — Z79899 Other long term (current) drug therapy: Secondary | ICD-10-CM | POA: Diagnosis not present

## 2017-01-02 DIAGNOSIS — D6959 Other secondary thrombocytopenia: Secondary | ICD-10-CM | POA: Diagnosis present

## 2017-01-02 DIAGNOSIS — Z800 Family history of malignant neoplasm of digestive organs: Secondary | ICD-10-CM | POA: Diagnosis not present

## 2017-01-02 DIAGNOSIS — J690 Pneumonitis due to inhalation of food and vomit: Secondary | ICD-10-CM | POA: Diagnosis not present

## 2017-01-02 DIAGNOSIS — D6481 Anemia due to antineoplastic chemotherapy: Secondary | ICD-10-CM | POA: Diagnosis present

## 2017-01-02 DIAGNOSIS — Z880 Allergy status to penicillin: Secondary | ICD-10-CM | POA: Diagnosis not present

## 2017-01-02 DIAGNOSIS — J45909 Unspecified asthma, uncomplicated: Secondary | ICD-10-CM | POA: Diagnosis not present

## 2017-01-02 DIAGNOSIS — C155 Malignant neoplasm of lower third of esophagus: Secondary | ICD-10-CM | POA: Diagnosis not present

## 2017-01-02 DIAGNOSIS — T451X5A Adverse effect of antineoplastic and immunosuppressive drugs, initial encounter: Secondary | ICD-10-CM | POA: Diagnosis present

## 2017-01-02 DIAGNOSIS — Z923 Personal history of irradiation: Secondary | ICD-10-CM | POA: Diagnosis not present

## 2017-01-02 DIAGNOSIS — D638 Anemia in other chronic diseases classified elsewhere: Secondary | ICD-10-CM | POA: Diagnosis not present

## 2017-01-02 DIAGNOSIS — R509 Fever, unspecified: Secondary | ICD-10-CM | POA: Diagnosis not present

## 2017-01-02 DIAGNOSIS — J22 Unspecified acute lower respiratory infection: Secondary | ICD-10-CM | POA: Diagnosis present

## 2017-01-02 DIAGNOSIS — J439 Emphysema, unspecified: Secondary | ICD-10-CM | POA: Diagnosis not present

## 2017-01-02 DIAGNOSIS — G801 Spastic diplegic cerebral palsy: Secondary | ICD-10-CM | POA: Diagnosis present

## 2017-01-02 DIAGNOSIS — Z87891 Personal history of nicotine dependence: Secondary | ICD-10-CM | POA: Diagnosis not present

## 2017-01-02 DIAGNOSIS — R6510 Systemic inflammatory response syndrome (SIRS) of non-infectious origin without acute organ dysfunction: Secondary | ICD-10-CM | POA: Diagnosis not present

## 2017-01-02 DIAGNOSIS — J440 Chronic obstructive pulmonary disease with acute lower respiratory infection: Secondary | ICD-10-CM | POA: Diagnosis present

## 2017-01-02 LAB — CBC
HEMATOCRIT: 31.2 % — AB (ref 39.0–52.0)
Hemoglobin: 10.5 g/dL — ABNORMAL LOW (ref 13.0–17.0)
MCH: 30.6 pg (ref 26.0–34.0)
MCHC: 33.7 g/dL (ref 30.0–36.0)
MCV: 91 fL (ref 78.0–100.0)
Platelets: 128 10*3/uL — ABNORMAL LOW (ref 150–400)
RBC: 3.43 MIL/uL — AB (ref 4.22–5.81)
RDW: 17.6 % — AB (ref 11.5–15.5)
WBC: 7.6 10*3/uL (ref 4.0–10.5)

## 2017-01-02 LAB — BASIC METABOLIC PANEL
ANION GAP: 6 (ref 5–15)
BUN: 9 mg/dL (ref 6–20)
CALCIUM: 8.7 mg/dL — AB (ref 8.9–10.3)
CO2: 25 mmol/L (ref 22–32)
Chloride: 106 mmol/L (ref 101–111)
Creatinine, Ser: 0.59 mg/dL — ABNORMAL LOW (ref 0.61–1.24)
GFR calc non Af Amer: >60 mL/min (ref >60)
GLUCOSE: 101 mg/dL — AB (ref 65–99)
POTASSIUM: 3.5 mmol/L (ref 3.5–5.1)
Sodium: 137 mmol/L (ref 135–145)

## 2017-01-02 LAB — MRSA PCR SCREENING: MRSA by PCR: NEGATIVE

## 2017-01-02 LAB — PROCALCITONIN: PROCALCITONIN: 1.02 ng/mL

## 2017-01-02 LAB — HIV ANTIBODY (ROUTINE TESTING W REFLEX): HIV Screen 4th Generation wRfx: NONREACTIVE

## 2017-01-02 MED ORDER — ONDANSETRON HCL 4 MG PO TABS: 4.0000 mg | ORAL_TABLET | Freq: Four times a day (QID) | ORAL | Status: DC | PRN

## 2017-01-02 MED ORDER — ALBUTEROL SULFATE (2.5 MG/3ML) 0.083% IN NEBU: 2.5000 mg | INHALATION_SOLUTION | Freq: Four times a day (QID) | RESPIRATORY_TRACT | Status: DC | PRN

## 2017-01-02 MED ORDER — VANCOMYCIN HCL 500 MG IV SOLR
500.0000 mg | Freq: Two times a day (BID) | INTRAVENOUS | Status: DC
  Administered 2017-01-02: 500 mg via INTRAVENOUS
  Filled 2017-01-02: qty 500

## 2017-01-02 MED ORDER — PREDNISONE 20 MG PO TABS
40.0000 mg | ORAL_TABLET | Freq: Every day | ORAL | Status: DC
  Administered 2017-01-02 – 2017-01-03 (×2): 40 mg via ORAL
  Filled 2017-01-02 (×2): qty 2

## 2017-01-02 MED ORDER — PANTOPRAZOLE SODIUM 40 MG PO TBEC
40.0000 mg | DELAYED_RELEASE_TABLET | Freq: Two times a day (BID) | ORAL | Status: DC
  Administered 2017-01-02 – 2017-01-03 (×3): 40 mg via ORAL
  Filled 2017-01-02 (×3): qty 1

## 2017-01-02 MED ORDER — ENOXAPARIN SODIUM 40 MG/0.4ML ~~LOC~~ SOLN
40.0000 mg | SUBCUTANEOUS | Status: DC
  Administered 2017-01-02 – 2017-01-03 (×2): 40 mg via SUBCUTANEOUS
  Filled 2017-01-02 (×2): qty 0.4

## 2017-01-02 MED ORDER — ACETAMINOPHEN 650 MG RE SUPP: 650.0000 mg | Freq: Four times a day (QID) | RECTAL | Status: DC | PRN

## 2017-01-02 MED ORDER — SUCRALFATE 1 GM/10ML PO SUSP
1.0000 g | Freq: Three times a day (TID) | ORAL | Status: DC
  Administered 2017-01-02 – 2017-01-03 (×6): 1 g via ORAL
  Filled 2017-01-02 (×6): qty 10

## 2017-01-02 MED ORDER — ONDANSETRON HCL 4 MG/2ML IJ SOLN
4.0000 mg | Freq: Four times a day (QID) | INTRAMUSCULAR | Status: DC | PRN
  Administered 2017-01-02: 4 mg via INTRAVENOUS
  Filled 2017-01-02: qty 2

## 2017-01-02 MED ORDER — TIZANIDINE HCL 4 MG PO TABS: 4.0000 mg | ORAL_TABLET | Freq: Four times a day (QID) | ORAL | Status: DC | PRN

## 2017-01-02 MED ORDER — ACETAMINOPHEN 325 MG PO TABS: 650.0000 mg | ORAL_TABLET | Freq: Four times a day (QID) | ORAL | Status: DC | PRN

## 2017-01-02 MED ORDER — SODIUM CHLORIDE 0.9 % IV SOLN
INTRAVENOUS | Status: AC
  Administered 2017-01-02 – 2017-01-03 (×4): via INTRAVENOUS

## 2017-01-02 MED ORDER — ACETAMINOPHEN 325 MG PO TABS
650.0000 mg | ORAL_TABLET | ORAL | Status: DC | PRN
  Filled 2017-01-02: qty 2

## 2017-01-02 MED ORDER — PROCHLORPERAZINE MALEATE 10 MG PO TABS: 10.0000 mg | ORAL_TABLET | Freq: Four times a day (QID) | ORAL | Status: DC | PRN

## 2017-01-02 MED ORDER — OXYCODONE-ACETAMINOPHEN 5-325 MG PO TABS: 1.0000 | ORAL_TABLET | ORAL | Status: DC | PRN

## 2017-01-02 MED ORDER — ALBUTEROL SULFATE HFA 108 (90 BASE) MCG/ACT IN AERS: 2.0000 | INHALATION_SPRAY | Freq: Four times a day (QID) | RESPIRATORY_TRACT | Status: DC | PRN

## 2017-01-02 MED ORDER — DEXTROSE 5 % IV SOLN
2.0000 g | Freq: Three times a day (TID) | INTRAVENOUS | Status: DC
  Administered 2017-01-02 – 2017-01-03 (×4): 2 g via INTRAVENOUS
  Filled 2017-01-02 (×5): qty 2

## 2017-01-02 MED ORDER — FLUTICASONE PROPIONATE 50 MCG/ACT NA SUSP
1.0000 | Freq: Every day | NASAL | Status: DC | PRN
  Filled 2017-01-02: qty 16

## 2017-01-02 MED ORDER — SODIUM CHLORIDE 0.9% FLUSH: 10.0000 mL | INTRAVENOUS | Status: DC | PRN

## 2017-01-02 NOTE — Progress Notes (Signed)
Pharmacy Antibiotic Note  Darrell Cunningham is a 56 y.o. male admitted on 01/01/2017 with rule out sepsis in immunocompromised patient (h/o esophageal cancer currently receiving chemotherapy).  Considering pneumonia as source. Pharmacy has been consulted for vancomycin and cefepime dosing.  S/p last chemo 5/8.  WBC WNL.  Would have anticipated counts to have recovered at this point from chemo 4 weeks ago.  Afebrile today.  Plan: MRSA nasal screen negative. Discontinuing vancomycin per discussion with Dr. Quincy Simmonds. Continue Cefepime 2g IV q8h.    Height: 5' (152.4 cm) Weight: 136 lb 7.4 oz (61.9 kg) IBW/kg (Calculated) : 50  Temp (24hrs), Avg:99.6 F (37.6 C), Min:97.9 F (36.6 C), Max:102.9 F (39.4 C)   Recent Labs Lab 01/01/17 2032 01/01/17 2127 01/02/17 0813  WBC 10.5  --  7.6  CREATININE 0.60*  --  0.59*  LATICACIDVEN  --  0.92  --     Estimated Creatinine Clearance: 79 mL/min (A) (by C-G formula based on SCr of 0.59 mg/dL (L)).    Allergies  Allergen Reactions  . Penicillins     Has patient had a PCN reaction causing immediate rash, facial/tongue/throat swelling, SOB or lightheadedness with hypotension: No Has patient had a PCN reaction causing severe rash involving mucus membranes or skin necrosis: No Has patient had a PCN reaction that required hospitalization No Has patient had a PCN reaction occurring within the last 10 years: No If all of the above answers are "NO", then may proceed with Cephalosporin use. Unknown Childhood reaction    Antimicrobials this admission:  6/5 vanc >> 6/5 6/5 cefepime >>  Dose adjustments this admission:   Microbiology results:  6/4 BCx:  6/5 MRSA PCR: neg  Thank you for allowing pharmacy to be a part of this patient's care.  Hershal Coria 01/02/2017 2:55 PM

## 2017-01-02 NOTE — Evaluation (Addendum)
Clinical/Bedside Swallow Evaluation Patient Details  Name: IDEN STRIPLING MRN: [id number] Date of Birth: 1960/10/08  Today's Date: 01/02/2017 Time: SLP Start Time (ACUTE ONLY): 68 SLP Stop Time (ACUTE ONLY): 1123 SLP Time Calculation (min) (ACUTE ONLY): 18 min  Past Medical History:  Past Medical History:  Diagnosis Date  . Allergy   . Arthritis   . Asthma    as a child  . Cancer ([place])   . 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 Dysphagia   . Encounter for nonprocreative genetic counseling 10/31/2016   Mr. Franchini underwent genetic counseling for hereditary cancer syndromes on 10/31/2016. Though he is a candidate for genetic testing, he declines at this time.  . Hyperlipidemia   . Neuromuscular disorder ([place])   . Pneumonia 4 yrs ago   Past Surgical History:  Past Surgical History:  Procedure Laterality Date  . EUS N/A 10/26/2016   Procedure: UPPER ENDOSCOPIC ULTRASOUND (EUS) LINEAR;  Surgeon: [name], MD;  Location: [facility] ENDOS[facility];  Service: Endoscopy;  Laterality: N/A;  . EYE SURGERY Bilateral age 85    for cross eyes  . IR FLUORO GUIDE PORT INSERTION RIGHT  11/08/2016  . IR US GUIDE VASC ACCESS RIGHT  11/08/2016  . MOUTH SURGERY     HPI:  56 yo male adm to [facility] with fevers, cough diagnosed with SIRS.  PMH + for recent diagnosis of neoplasm of distal 1/3 of esophagus - pt has undergone radiation and chemo.  He also reports he is to have surgery to have esophagus removed next month.  Pt also has h/o CP, asthma, HLD, neuromuscular d/o.  Swallow evaluation ordered.    Assessment / Plan / Recommendation Clinical Impression  Pt with no clinical indications of oropharyngeal dysphagia with boluses observed.  Given h/o esophageal neoplasm s/p XRT and chemo - dysphagia symptoms likely esopahgeal.  SlP observed pt consuming water and applesauce - eating and drinking slowly. No indications of airway compromise.  Pt admits to occasional issues with sensing food lodging in  esophagus that causes him to regurgitate.  He denies aspiration/coughing on regurgitated items.  SLP provided esopahgeal and xerostomia mitigation strategies orally and in writing using teach back.  Advised frequent small meals and extra gravies/sauces, warm liquids with meals may be helpful.  Pt does not have dentures but report he is masticating adequately.   SLP Visit Diagnosis: Dysphagia, unspecified (R13.10)    Aspiration Risk  Mild aspiration risk    Diet Recommendation Dysphagia 3 (Mech soft);Thin liquid   Liquid Administration via: Cup;Straw Medication Administration: Other (Comment) (defer to pt requests) Supervision: Patient able to self feed Compensations: Slow rate;Small sips/bites (start meal with liquids, drink liquids t/o meal) Postural Changes: Remain upright for at least 30 minutes after po intake;Seated upright at 90 degrees    Other  Recommendations Oral Care Recommendations: Oral care BID   Follow up Recommendations None      Frequency and Duration   n/a         Prognosis   n/a     Swallow Study   General Date of Onset: 01/02/17 HPI: 56 yo male adm to [facility] with SIRS.  PMH + for recent diagnosis of neoplasm of distal 1/3 of esophagus - pt has undergone radiation and chemo.  He also reports he is to have surgery to have esophagus removed next month.  Pt also has h/o CP, asthma, HLD, neuromuscular d/o.  Swallow evaluation ordered.  Type of Study: Bedside Swallow Evaluation  Diet Prior to this Study: Dysphagia 3 (soft);Thin liquids Temperature Spikes Noted: Yes Respiratory Status: Room air History of Recent Intubation: No Behavior/Cognition: Alert Oral Cavity Assessment: Within Functional Limits Oral Care Completed by SLP: No Oral Cavity - Dentition: Dentures, not available Vision: Functional for self-feeding Self-Feeding Abilities: Able to feed self Patient Positioning: Upright in bed Baseline Vocal Quality: Normal Volitional Cough: Strong  (productive) Volitional Swallow: Able to elicit    Oral/Motor/Sensory Function Overall Oral Motor/Sensory Function: Within functional limits   Ice Chips Ice chips: Not tested   Thin Liquid Thin Liquid: Within functional limits Presentation: Self Fed;Straw    Nectar Thick Nectar Thick Liquid: Not tested   Honey Thick Honey Thick Liquid: Not tested   Puree Puree: Within functional limits Presentation: Self Fed;Spoon   Solid   GO   Solid: Not tested    Functional Assessment Tool Used: clinical judgement Functional Limitations: Swallowing Swallow Current Status ([id number]): At least 20 percent but less than 40 percent impaired, limited or restricted Swallow Goal Status [phone]): At least 20 percent but less than 40 percent impaired, limited or restricted Swallow Discharge Status [phone]): At least 20 percent but less than 40 percent impaired, limited or restricted   Claudie Fisherman, [facility] SLP [phone]

## 2017-01-02 NOTE — H&P (Signed)
History and Physical    Darrell Cunningham GMW:[id number] DOB: [birth year] DOA: 01/01/2017  PCP: Darrell Frizzle, MD  Patient coming from: Home.  Chief Complaint: Fever and chills.  HPI: Darrell Cunningham is a 56 y.o. male with history of recently diagnosis of anal cancer status post chemoradiation and plan to have surgery next month presents to the ER with fever and chills with productive cough. Patient's symptoms started 24 hours ago with productive cough and fever chills. 2-3 days ago patient had nausea and vomiting. Denies any diarrhea or abdominal pain.   ED Course: In the ER patient was found to be febrile and tachycardic. Chest x-ray does not show anything acute. Blood cultures were obtained and patient started on empiric antibiotics and admitted for further observation of SIRS. Likely sources pulmonary.  Review of Systems: As per HPI, rest all negative.   Past Medical History:  Diagnosis Date  . Allergy   . Arthritis   . Asthma    as a child  . Cancer ([place])   . 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 Dysphagia   . Encounter for nonprocreative genetic counseling 10/31/2016   Mr. Mcwilliams underwent genetic counseling for hereditary cancer syndromes on 10/31/2016. Though he is a candidate for genetic testing, he declines at this time.  . Hyperlipidemia   . Neuromuscular disorder ([place])   . Pneumonia 4 yrs ago    Past Surgical History:  Procedure Laterality Date  . EUS N/A 10/26/2016   Procedure: UPPER ENDOSCOPIC ULTRASOUND (EUS) LINEAR;  Surgeon: Darrell Banister, MD;  Location: [facility];  Service: Endoscopy;  Laterality: N/A;  . EYE SURGERY Bilateral age 56    for cross eyes  . IR FLUORO GUIDE PORT INSERTION RIGHT  11/08/2016  . IR US GUIDE VASC ACCESS RIGHT  11/08/2016  . MOUTH SURGERY       reports that he quit smoking about 4 years ago. His smoking use included Cigarettes. He started smoking about 4 years ago. He has a 38.00 pack-year smoking history. He  has never used smokeless tobacco. He reports that he does not drink alcohol or use drugs.  Allergies  Allergen Reactions  . Penicillins     Has patient had a PCN reaction causing immediate rash, facial/tongue/throat swelling, SOB or lightheadedness with hypotension: No Has patient had a PCN reaction causing severe rash involving mucus membranes or skin necrosis: No Has patient had a PCN reaction that required hospitalization No Has patient had a PCN reaction occurring within the last 10 years: No If all of the above answers are "NO", then may proceed with Cephalosporin use. Unknown Childhood reaction    Family History  Problem Relation Age of Onset  . Colon cancer Maternal Grandmother 34  . Breast cancer Maternal Grandmother 64  . Breast cancer Sister 25       Deceased at 99 of breast cancer  . Ovarian cancer Maternal Aunt   . Stomach cancer Neg Hx   . Esophageal cancer Neg Hx     Prior to Admission medications   Medication Sig Start Date End Date Taking? Authorizing Provider  fluticasone (FLONASE) 50 MCG/ACT nasal spray Place 1 spray into both nostrils daily as needed for allergies or rhinitis.   Yes [provider]  lidocaine-prilocaine (EMLA) cream Apply 1 application topically as needed. Apply to portacath  1 1/2 hours - 2 hours prior to procedures as needed. 11/08/16  Yes Darrell Merle, MD  ondansetron [facility]) 8  MG tablet Take 1 tablet (8 mg total) by mouth 2 (two) times daily as needed for refractory nausea / vomiting. Start on day 3 after chemo. 10/25/16  Yes Darrell Merle, MD  oxyCODONE-acetaminophen (PERCOCET/ROXICET) 5-325 MG tablet Take 1 tablet by mouth every 4 (four) hours as needed for severe pain. 12/14/16  Yes Long, Wonda Olds, MD  pantoprazole (PROTONIX) 40 MG tablet Take 1 tablet (40 mg total) by mouth 2 (two) times daily. 09/21/16  Yes Darrell Frizzle, MD  PROAIR HFA 108 [phone] Base) MCG/ACT inhaler INHALE 2 PUFFS INTO THE LUNGS EVERY 6 (SIX) HOURS AS NEEDED FOR WHEEZING  OR SHORTNESS OF BREATH. 03/17/16  Yes Darrell Frizzle, MD  prochlorperazine (COMPAZINE) 10 MG tablet Take 1 tablet (10 mg total) by mouth every 6 (six) hours as needed (Nausea or vomiting). 10/25/16  Yes Darrell Merle, MD  sucralfate (CARAFATE) 1 GM/10ML suspension Take 10 mLs (1 g total) by mouth 4 (four) times daily -  with meals and at bedtime. 12/14/16  Yes Long, Wonda Olds, MD  tiZANidine (ZANAFLEX) 4 MG tablet TAKE 1 TABLET (4 MG TOTAL) BY MOUTH EVERY 6 (SIX) HOURS AS NEEDED FOR MUSCLE SPASMS. 10/19/16  Yes Darrell Frizzle, MD  magic mouthwash w/lidocaine SOLN Take 5 mLs by mouth 4 (four) times daily as needed for mouth pain. Take 5 ml by mouth - swish and swallow  OR swish and spit -  4 times daily as needed for mouth pain. Patient not taking: Reported on 01/01/2017 12/04/16   Darrell Merle, MD    Physical Exam: Vitals:   01/01/17 2313 01/01/17 2350 01/02/17 0308 01/02/17 0529  BP: 105/73 108/66  122/66  Pulse: (!) 119 (!) 113  98  Resp: (!) 30 20  17   Temp:  (!) 101 F (38.3 C) 99 F (37.2 C) 98.3 F (36.8 C)  TempSrc:  Oral Oral Oral  SpO2: 94% 97%  96%  Weight:  61.9 kg (136 lb 7.4 oz)    Height:  5' (1.524 m)        Constitutional: Moderately built and nourished. Vitals:   01/01/17 2313 01/01/17 2350 01/02/17 0308 01/02/17 0529  BP: 105/73 108/66  122/66  Pulse: (!) 119 (!) 113  98  Resp: (!) 30 20  17   Temp:  (!) 101 F (38.3 C) 99 F (37.2 C) 98.3 F (36.8 C)  TempSrc:  Oral Oral Oral  SpO2: 94% 97%  96%  Weight:  61.9 kg (136 lb 7.4 oz)    Height:  5' (1.524 m)     Eyes: Anicteric no pallor. ENMT: No discharge from the ears eyes nose or mouth. Neck: No mass felt. No JVD appreciated no neck rigidity. Respiratory: No rhonchi or crepitations. Cardiovascular: S1 and S2 heard no murmurs appreciated. Abdomen: Soft nontender bowel sounds present. Musculoskeletal: No edema. No joint effusion. Skin: No rash. Skin appears warm. Neurologic: Alert awake oriented to time place and  person. Moves all extremities. Psychiatric: Appears normal. Normal affect.   Labs on Admission: I have personally reviewed following labs and imaging studies  CBC:  Recent Labs Lab 01/01/17 2032  WBC 10.5  NEUTROABS 9.1*  HGB 11.6*  HCT 33.8*  MCV 89.4  PLT 628   Basic Metabolic Panel:  Recent Labs Lab 01/01/17 2032  NA 135  K 3.6  CL 102  CO2 24  GLUCOSE 109*  BUN 13  CREATININE 0.60*  CALCIUM 9.2   GFR: Estimated Creatinine Clearance: 79 mL/min (A) (by C-G formula  based on SCr of 0.6 mg/dL (L)). Liver Function Tests:  Recent Labs Lab 01/01/17 2032  AST 27  ALT 39  ALKPHOS 83  BILITOT 0.8  PROT 7.4  ALBUMIN 3.6   No results for input(s): LIPASE, AMYLASE in the last 168 hours. No results for input(s): AMMONIA in the last 168 hours. Coagulation Profile:  Recent Labs Lab 01/01/17 2032  INR 0.98   Cardiac Enzymes: No results for input(s): CKTOTAL, CKMB, CKMBINDEX, TROPONINI in the last 168 hours. BNP (last 3 results) No results for input(s): PROBNP in the last 8760 hours. HbA1C: No results for input(s): HGBA1C in the last 72 hours. CBG: No results for input(s): GLUCAP in the last 168 hours. Lipid Profile: No results for input(s): CHOL, HDL, LDLCALC, TRIG, CHOLHDL, LDLDIRECT in the last 72 hours. Thyroid Function Tests: No results for input(s): TSH, T4TOTAL, FREET4, T3FREE, THYROIDAB in the last 72 hours. Anemia Panel: No results for input(s): VITAMINB12, FOLATE, FERRITIN, TIBC, IRON, RETICCTPCT in the last 72 hours. Urine analysis:    Component Value Date/Time   COLORURINE STRAW (A) 01/01/2017 2220   APPEARANCEUR CLEAR 01/01/2017 2220   LABSPEC 1.006 01/01/2017 2220   PHURINE 7.0 01/01/2017 2220   GLUCOSEU NEGATIVE 01/01/2017 2220   HGBUR NEGATIVE 01/01/2017 2220   BILIRUBINUR NEGATIVE 01/01/2017 2220   KETONESUR NEGATIVE 01/01/2017 2220   PROTEINUR NEGATIVE 01/01/2017 2220   NITRITE NEGATIVE 01/01/2017 2220   LEUKOCYTESUR NEGATIVE  01/01/2017 2220   Sepsis Labs: @LABRCNTIP (procalcitonin:4,lacticidven:4) )No results found for this or any previous visit (from the past 240 hour(s)).   Radiological Exams on Admission: Dg Chest 2 View  Result Date: 01/01/2017 CLINICAL DATA:  Congestion, cough and dyspnea x3 days. Esophageal carcinoma. Post chemotherapy and radiation. EXAM: CHEST  2 VIEW COMPARISON:  10/12/2016 CT chest. FINDINGS: The heart size and mediastinal contours are within normal limits. Right-sided port catheter tip in the cavoatrial junction. Both lungs are clear. No dilatation of the esophagus in this patient with known distal esophageal mass like abnormality on CT. The visualized skeletal structures are unremarkable. IMPRESSION: No active cardiopulmonary disease. Electronically Signed   By: Ashley Royalty M.D.   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 SIRS - likely source could be aspiration pneumonia. Check swallow evaluation. Patient is on enteric antibiotics. Follow blood cultures. On IV fluids. 2. Anemia likely related to chemotherapy and cancer - follow CBC. 3. Esophageal cancer status post chemoradiation plan for surgery next month. 4. COPD not actively wheezing.   DVT prophylaxis: Lovenox. Code Status: Full code.  Family Communication: Discussed with patient.  Disposition Plan: Home.  Consults called:  Speech therapist. Admission status: Observation.    Rise Patience MD [facility] Pager [phone].  If 7PM-7AM, please contact night-coverage [email or web address] Password [facility]  01/02/2017, 6:29 AM

## 2017-01-02 NOTE — Progress Notes (Signed)
[facility]   Patient admitted after midnight, chart reviewed, see H&P for further details.   Darrell Cunningham is a 56 year old male with history of anal cancer presented to the emergency department complaining of fever and chills associated with discomfort for the past 2-3 days. He was admitted for possible sepsis due to questionable aspiration pneumonia. Patient was started on broad-spectrum antibiotic.   Impression: Lower respiratory track infection ? Aspiration  - in view of immunocompromise patient, could be viral etiology as well, clinically patient with wheezing and rales in the RLL, fever and left shift on CBC. Procalcitonin 1.0 COPD mild exacerbation  Anemia of chronic disease  Plan: On Vanc and cefepime, d/c vanc if MRSA negative  Will add burst of prednisone given diffuse wheezing  Continue albuterol  If remain stable tomorrow can d/c on Vantin for 7 days   Chipper Oman, MD

## 2017-01-03 DIAGNOSIS — R6510 Systemic inflammatory response syndrome (SIRS) of non-infectious origin without acute organ dysfunction: Secondary | ICD-10-CM

## 2017-01-03 DIAGNOSIS — D638 Anemia in other chronic diseases classified elsewhere: Secondary | ICD-10-CM

## 2017-01-03 DIAGNOSIS — J439 Emphysema, unspecified: Secondary | ICD-10-CM

## 2017-01-03 DIAGNOSIS — C155 Malignant neoplasm of lower third of esophagus: Secondary | ICD-10-CM

## 2017-01-03 MED ORDER — IPRATROPIUM-ALBUTEROL 0.5-2.5 (3) MG/3ML IN SOLN: 3.0000 mL | Freq: Four times a day (QID) | RESPIRATORY_TRACT | 0 refills | Status: DC | PRN

## 2017-01-03 MED ORDER — IPRATROPIUM-ALBUTEROL 0.5-2.5 (3) MG/3ML IN SOLN: 3.0000 mL | Freq: Four times a day (QID) | RESPIRATORY_TRACT | Status: DC | PRN

## 2017-01-03 MED ORDER — ACETAMINOPHEN 325 MG PO TABS: 650.0000 mg | ORAL_TABLET | Freq: Four times a day (QID) | ORAL | 0 refills | Status: DC | PRN

## 2017-01-03 MED ORDER — CEFPODOXIME PROXETIL 200 MG PO TABS
200.0000 mg | ORAL_TABLET | Freq: Two times a day (BID) | ORAL | Status: DC
  Administered 2017-01-03: 200 mg via ORAL
  Filled 2017-01-03: qty 1

## 2017-01-03 MED ORDER — HEPARIN SOD (PORK) LOCK FLUSH 100 UNIT/ML IV SOLN
500.0000 [IU] | INTRAVENOUS | Status: AC | PRN
  Administered 2017-01-03: 500 [IU]

## 2017-01-03 MED ORDER — PREDNISONE 10 MG (21) PO TBPK: ORAL_TABLET | ORAL | 0 refills | Status: DC

## 2017-01-03 MED ORDER — CEFPODOXIME PROXETIL 200 MG PO TABS: 200.0000 mg | ORAL_TABLET | Freq: Two times a day (BID) | ORAL | 0 refills | Status: DC

## 2017-01-03 NOTE — Progress Notes (Signed)
D/c to home w/ sister via w/c.All d/c instructions given w/ verbal understanding.Voices no c/o

## 2017-01-03 NOTE — Discharge Summary (Signed)
Physician Discharge Summary  Darrell Cunningham IRC:[id number] DOB: 06-12-1961 DOA: 01/01/2017  PCP: [name], MD  Admit date: 01/01/2017 Discharge date: 01/03/2017  Admitted From: Home Disposition: Home  Recommendations for Outpatient Follow-up:  1. Follow up with PCP in 1-2 weeks 2. Follow up with Radiation Oncology at next [place] visit 3. Follow up with CT Surgery for Esophagectomy on 02/05/17 4. Please obtain CMP/CBC in one week 5. Repeat CXR in 3-6 [place]: No Equipment/Devices: Nebulizer  Discharge Condition: Stable CODE STATUS: FULL CODE Diet recommendation: Dysphagia 3  Brief/Interim Summary: Darrell Cunningham is a 56 year old male with history of esophageal cancer presented to the emergency department complaining of fever and chills associated with discomfort for the past 2-3 days. In the ER he was febrile and tachycardica and CXR did not show anything acute. He was admitted for possible SIRS due to questionable aspiration pneumonia. Patient was started on broad-spectrum antibiotics and MRSA was negative so he was discontinued on IV Vancomycin. Patient improved quicker than expected and was stable to D/C home. He will be sent home on a Home Nebulizer, Steroid Taper, and po Vantin for 7 day course. Patient improved and will need to follow up with PCP as an outpatient. While patient was hospitalized he was seen by Radiation Oncology as he had an appointment on 6/8. He will need follow up imaging as an outpatient.   Discharge Diagnoses:  Principal Problem:   SIRS (systemic inflammatory response syndrome) (HCC) Active Problems:   Malignant neoplasm of lower third of esophagus (HCC)  SIRS - likely source could be aspiration pneumonia/lower respiratory tract infection due to Esophageal Cancer.  -Checked swallow evaluation and recommended Dysphagia 3 Diet for D/C -Patient is on empiric antibiotics and has been Afebrile for 24 hours. IV Cefepime changed to po Vantin   -Follow blood cultures and showed NGTD at 1 day.  -Was on IV fluids. -Procalcitonin was 1.0 -Follow up with PCP and have outpatient CXR and Bloodwork repeated.   Anemia likely related to chemotherapy and cancer  -? Dilutional Component -Patient was getting IVF  -Hb/Hct Dropped from 11.6/33.8 -> 10.5/31.2 -Follow up CBC as an outpatient   Thrombocytopenia -?Related to Chemo -Follow CBC as an outpatient   Esophageal cancer status post chemoradiation  -Radiation Oncology [facility] saw patient in the [place] well since completeing treatment -Repeat Imaging and will need to follow up with CT Surgery for planned Esophagectomy on 02/05/17   COPD  -Was started on Steroids yesterday and wheezing improved -D/C home on Steroid Taper and Home Nebulizer with DuoNebs prn -Follow up with PCP and repeat CXR in AM  Discharge Instructions  Discharge Instructions    Call MD for:  difficulty breathing, headache or visual disturbances    Complete by:  As directed    Call MD for:  extreme fatigue    Complete by:  As directed    Call MD for:  hives    Complete by:  As directed    Call MD for:  persistant dizziness or light-headedness    Complete by:  As directed    Call MD for:  persistant nausea and vomiting    Complete by:  As directed    Call MD for:  severe uncontrolled pain    Complete by:  As directed    Call MD for:  temperature >100.4    Complete by:  As directed    Diet - low sodium heart healthy    Complete  by:  As directed    Discharge instructions    Complete by:  As directed    Follow up with PCP and Oncology as an outpatient and take all medications as prescribed. If symptoms change or worsen please return to the ED for evaluation.   Increase activity slowly    Complete by:  As directed      Allergies as of 01/03/2017      Reactions   Penicillins    Has patient had a PCN reaction causing immediate rash, facial/tongue/throat swelling, SOB or lightheadedness  with hypotension: No Has patient had a PCN reaction causing severe rash involving mucus membranes or skin necrosis: No Has patient had a PCN reaction that required hospitalization No Has patient had a PCN reaction occurring within the last 10 years: No If all of the above answers are "NO", then may proceed with Cephalosporin use. Unknown Childhood reaction      Medication List    STOP taking these medications   magic mouthwash w/lidocaine Soln     TAKE these medications   acetaminophen 325 MG tablet Commonly known as:  TYLENOL Take 2 tablets (650 mg total) by mouth every 6 (six) hours as needed for mild pain (or Fever >/= 101).   cefpodoxime 200 MG tablet Commonly known as:  VANTIN Take 1 tablet (200 mg total) by mouth every 12 (twelve) hours.   fluticasone 50 MCG/ACT nasal spray Commonly known as:  FLONASE Place 1 spray into both nostrils daily as needed for allergies or rhinitis.   ipratropium-albuterol 0.5-2.5 (3) MG/3ML Soln Commonly known as:  DUONEB Take 3 mLs by nebulization every 6 (six) hours as needed.   lidocaine-prilocaine cream Commonly known as:  EMLA Apply 1 application topically as needed. Apply to portacath  1 1/2 hours - 2 hours prior to procedures as needed.   ondansetron 8 MG tablet Commonly known as:  ZOFRAN Take 1 tablet (8 mg total) by mouth 2 (two) times daily as needed for refractory nausea / vomiting. Start on day 3 after chemo.   oxyCODONE-acetaminophen 5-325 MG tablet Commonly known as:  PERCOCET/ROXICET Take 1 tablet by mouth every 4 (four) hours as needed for severe pain.   pantoprazole 40 MG tablet Commonly known as:  PROTONIX Take 1 tablet (40 mg total) by mouth 2 (two) times daily.   predniSONE 10 MG (21) Tbpk tablet Commonly known as:  STERAPRED UNI-PAK 21 TAB Take 6 Pills Day 1, 5 Pills Day 2, 4 Pills Day 3, 3 Pills Day 4, 2 Pills Day 5, 1 Pill Day 6 and then Stop.   PROAIR HFA 108 (90 Base) MCG/ACT inhaler Generic drug:   albuterol INHALE 2 PUFFS INTO THE LUNGS EVERY 6 (SIX) HOURS AS NEEDED FOR WHEEZING OR SHORTNESS OF BREATH.   prochlorperazine 10 MG tablet Commonly known as:  COMPAZINE Take 1 tablet (10 mg total) by mouth every 6 (six) hours as needed (Nausea or vomiting).   sucralfate 1 GM/10ML suspension Commonly known as:  CARAFATE Take 10 mLs (1 g total) by mouth 4 (four) times daily -  with meals and at bedtime.   tiZANidine 4 MG tablet Commonly known as:  ZANAFLEX TAKE 1 TABLET (4 MG TOTAL) BY MOUTH EVERY 6 (SIX) HOURS AS NEEDED FOR MUSCLE SPASMS.            Durable Medical Equipment        Start     Ordered   01/03/17 1015  For home use only DME Nebulizer machine  Once    Question:  Patient needs a nebulizer to treat with the following condition  Answer:  Asthma   01/03/17 1016      Allergies  Allergen Reactions  . Penicillins     Has patient had a PCN reaction causing immediate rash, facial/tongue/throat swelling, SOB or lightheadedness with hypotension: No Has patient had a PCN reaction causing severe rash involving mucus membranes or skin necrosis: No Has patient had a PCN reaction that required hospitalization No Has patient had a PCN reaction occurring within the last 10 years: No If all of the above answers are "NO", then may proceed with Cephalosporin use. Unknown Childhood reaction    Consultations:  Radiation Oncology [facility]  SLP  Procedures/Studies: Dg Chest 2 View  Result Date: 01/01/2017 CLINICAL DATA:  Congestion, cough and dyspnea x3 days. Esophageal carcinoma. Post chemotherapy and radiation. EXAM: CHEST  2 VIEW COMPARISON:  10/12/2016 CT chest. FINDINGS: The heart size and mediastinal contours are within normal limits. Right-sided port catheter tip in the cavoatrial junction. Both lungs are clear. No dilatation of the esophagus in this patient with known distal esophageal mass like abnormality on CT. The visualized skeletal structures are  unremarkable. IMPRESSION: No active cardiopulmonary disease. Electronically Signed   By: Ashley Royalty M.D.   On: 01/01/2017 21:38   Ct Abdomen Pelvis W Contrast  Result Date: 12/14/2016 CLINICAL DATA:  Per pt, states he has been unable to eat since last Thursday-last BM was last Thursday-states history of stomach cancer dx 09/2016-last radiation/chemo was last week-states some vomiting-complaining of lower abdominal pain radiating to the back. Patient takes morphine for pain. EXAM: CT ABDOMEN AND PELVIS WITH CONTRAST TECHNIQUE: Multidetector CT imaging of the abdomen and pelvis was performed using the standard protocol following bolus administration of intravenous contrast. CONTRAST:  174mL ISOVUE-300 IOPAMIDOL (ISOVUE-300) INJECTION 61% COMPARISON:  10/24/2016, 10/02/2016 FINDINGS: Lower chest: Heart size is normal.  Trace pericardial effusion. Hepatobiliary: No focal liver abnormality is seen. No gallstones, gallbladder wall thickening, or biliary dilatation. Pancreas: Unremarkable. No pancreatic ductal dilatation or surrounding inflammatory changes. Spleen: Normal in size without focal abnormality. Adrenals/Urinary Tract: Adrenal glands are normal in appearance. Kidneys are symmetric in enhancement and excretion. No focal renal mass or hydronephrosis. Stomach/Bowel: There has been significant improvement in the appearance of distal esophageal in proximal stomach wall thickening. Maximum thickness measures 1.1 cm, previously 2.3 cm. Tiny gastrohepatic lymph nodes persist but are significantly smaller, largest measuring approximately 5 mm. Small paraesophageal lymph node is 5 mm on image 9 of series 2. The stomach has a normal appearance. There is mild dilatation of a short segment of proximal jejunum, measuring up to 3 cm. However remainder of the small bowel loops are normal in appearance. The appendix is well seen and has a normal appearance. Colon is normal in appearance. Vascular/Lymphatic: There is  significant atherosclerotic calcification of the abdominal aorta. No aneurysm. Right common iliac lymph node is 7 mm on image 63 of series 2. Reproductive: Prostate is unremarkable. Other: Stable fat containing right inguinal hernia. No free pelvic fluid. Musculoskeletal: No acute or significant osseous findings. IMPRESSION: 1. Mildly dilated short segment of proximal jejunum without bowel wall thickening or significant inflammatory changes. Findings may represent focal ileus or radiation enteritis. No evidence for obstruction. 2. Significantly improved appearance of the distal esophagus/proximal stomach with decreased wall thickening (1.1 cm from 2.3 cm), and smaller paraesophageal and gastrohepatic ligament lymph nodes. 3. No bowel obstruction.  Normal appendix. Electronically Signed   By:  Nolon Nations M.D.   On: 12/14/2016 12:04     Subjective: Seen and examined and was feeling significantly better. No wheezing. No trouble breathing and ambulating in room without O2. Ready to go home.   Discharge Exam: Vitals:   01/02/17 2015 01/03/17 0501  BP: 128/68 115/77  Pulse: 76 87  Resp: 18 18  Temp: 98 F (36.7 C) 97.9 F (36.6 C)   Vitals:   01/02/17 0529 01/02/17 1405 01/02/17 2015 01/03/17 0501  BP: 122/66 104/67 128/68 115/77  Pulse: 98 81 76 87  Resp: 17 16 18 18   Temp: 98.3 F (36.8 C) 97.9 F (36.6 C) 98 F (36.7 C) 97.9 F (36.6 C)  TempSrc: Oral Oral Oral Oral  SpO2: 96% 97% 97% 97%  Weight:      Height:       General: Pt is alert, awake, not in acute distress Cardiovascular: RRR, S1/S2 +, no rubs, no gallops Respiratory: CTA bilaterally, no wheezing, no rhonchi Abdominal: Soft, NT, ND, bowel sounds + Extremities: no edema, no cyanosis  The results of significant diagnostics from this hospitalization (including imaging, microbiology, ancillary and laboratory) are listed below for reference.    Microbiology: Recent Results (from the past 240 hour(s))  MRSA PCR  Screening     Status: None   Collection Time: 01/02/17 11:00 AM  Result Value Ref Range Status   MRSA by PCR NEGATIVE NEGATIVE Final    Comment:        The GeneXpert MRSA Assay (FDA approved for NASAL specimens only), is one component of a comprehensive MRSA colonization surveillance program. It is not intended to diagnose MRSA infection nor to guide or monitor treatment for MRSA infections.      Labs: BNP (last 3 results) No results for input(s): BNP in the last 8760 hours. Basic Metabolic Panel:  Recent Labs Lab 01/01/17 2032 01/02/17 0813  NA 135 137  K 3.6 3.5  CL 102 106  CO2 24 25  GLUCOSE 109* 101*  BUN 13 9  CREATININE 0.60* 0.59*  CALCIUM 9.2 8.7*   Liver Function Tests:  Recent Labs Lab 01/01/17 2032  AST 27  ALT 39  ALKPHOS 83  BILITOT 0.8  PROT 7.4  ALBUMIN 3.6   No results for input(s): LIPASE, AMYLASE in the last 168 hours. No results for input(s): AMMONIA in the last 168 hours. CBC:  Recent Labs Lab 01/01/17 2032 01/02/17 0813  WBC 10.5 7.6  NEUTROABS 9.1*  --   HGB 11.6* 10.5*  HCT 33.8* 31.2*  MCV 89.4 91.0  PLT 152 128*   Cardiac Enzymes: No results for input(s): CKTOTAL, CKMB, CKMBINDEX, TROPONINI in the last 168 hours. BNP: Invalid input(s): POCBNP CBG: No results for input(s): GLUCAP in the last 168 hours. D-Dimer No results for input(s): DDIMER in the last 72 hours. Hgb A1c No results for input(s): HGBA1C in the last 72 hours. Lipid Profile No results for input(s): CHOL, HDL, LDLCALC, TRIG, CHOLHDL, LDLDIRECT in the last 72 hours. Thyroid function studies No results for input(s): TSH, T4TOTAL, T3FREE, THYROIDAB in the last 72 hours.  Invalid input(s): FREET3 Anemia work up No results for input(s): VITAMINB12, FOLATE, FERRITIN, TIBC, IRON, RETICCTPCT in the last 72 hours. Urinalysis    Component Value Date/Time   COLORURINE STRAW (A) 01/01/2017 2220   APPEARANCEUR CLEAR 01/01/2017 2220   LABSPEC 1.006 01/01/2017  2220   PHURINE 7.0 01/01/2017 2220   GLUCOSEU NEGATIVE 01/01/2017 2220   HGBUR NEGATIVE 01/01/2017 2220   BILIRUBINUR  NEGATIVE 01/01/2017 2220   KETONESUR NEGATIVE 01/01/2017 2220   PROTEINUR NEGATIVE 01/01/2017 2220   NITRITE NEGATIVE 01/01/2017 2220   LEUKOCYTESUR NEGATIVE 01/01/2017 2220   Sepsis Labs Invalid input(s): PROCALCITONIN,  WBC,  LACTICIDVEN Microbiology Recent Results (from the past 240 hour(s))  MRSA PCR Screening     Status: None   Collection Time: 01/02/17 11:00 AM  Result Value Ref Range Status   MRSA by PCR NEGATIVE NEGATIVE Final    Comment:        The GeneXpert MRSA Assay (FDA approved for NASAL specimens only), is one component of a comprehensive MRSA colonization surveillance program. It is not intended to diagnose MRSA infection nor to guide or monitor treatment for MRSA infections.    Time coordinating discharge: 35 minutes  SIGNED:  Kerney Elbe, DO [facility] 01/03/2017, 1:18 PM Pager [phone]  If 7PM-7AM, please contact night-coverage [email or web address] Password [email or web address]

## 2017-01-03 NOTE — Progress Notes (Signed)
Radiation Oncology         [phone] ________________________________  Name: Darrell Cunningham MRN: [id number]  Date: 01/01/2017  DOB: Aug 19, 1960  Post Treatment Note  CC: [name], MD  No ref. provider found  Diagnosis:   Stage III, cT3N1M0 adenocarcinoma of the GE junction/esophagus.  Interval Since Last Radiation:  4 weeks   10/30/16-12/07/16 1. Esophagus/ 45 Gy in 25 fractions 2. Esophagus boost/ 5.4 Gy in 3 fractions   Narrative:  The patient returns today for routine follow-up.  The patient did well during treatment but did have modest weight loss and esophagitis. He also developed small bowel distention following radiotherapy which improved with conservative management. He's planning to have a restaging CT of the C/A/P on 6/21 and hoping to undergo esophagectomy on 02/05/17. He's currently been admitted due to pneumonia and is being discharged today.                     On review of systems, the patient states he's feeling great. He denies esophagitis, but states he has pain with swallowing if he eats too quickly. He denies any fevers or chills. He denies shortness of breath or chest pain. No other complaints are noted.   ALLERGIES:  is allergic to penicillins.  Meds: Current Facility-Administered Medications  Medication Dose Route Frequency Provider Last Rate Last Dose  . acetaminophen (TYLENOL) tablet 650 mg  650 mg Oral Q6H PRN Rise Patience, MD       Or  . acetaminophen (TYLENOL) suppository 650 mg  650 mg Rectal Q6H PRN Rise Patience, MD      . albuterol (PROVENTIL) (2.5 MG/3ML) 0.083% nebulizer solution 2.5 mg  2.5 mg Nebulization Q6H PRN Rise Patience, MD      . cefpodoxime Bennie Pierini) tablet 200 mg  200 mg Oral Q12H Sheikh, Omair Latif, DO      . enoxaparin (LOVENOX) injection 40 mg  40 mg Subcutaneous Q24H Rise Patience, MD   40 mg at 01/03/17 0729  . fluticasone (FLONASE) 50 MCG/ACT nasal spray 1 spray  1 spray Each Nare Daily  PRN Rise Patience, MD      . ondansetron [facility]) tablet 4 mg  4 mg Oral Q6H PRN Rise Patience, MD       Or  . ondansetron [facility]) injection 4 mg  4 mg Intravenous Q6H PRN Rise Patience, MD   4 mg at 01/02/17 0827  . oxyCODONE-acetaminophen (PERCOCET/ROXICET) 5-325 MG per tablet 1 tablet  1 tablet Oral Q4H PRN Rise Patience, MD      . pantoprazole (PROTONIX) EC tablet 40 mg  40 mg Oral BID Rise Patience, MD   40 mg at 01/03/17 0730  . predniSONE (DELTASONE) tablet 40 mg  40 mg Oral Q breakfast Patrecia Pour, Christean Grief, MD   40 mg at 01/03/17 0730  . prochlorperazine (COMPAZINE) tablet 10 mg  10 mg Oral Q6H PRN Rise Patience, MD      . sodium chloride flush (NS) 0.9 % injection 10-40 mL  10-40 mL Intracatheter PRN Patrecia Pour, Christean Grief, MD      . sucralfate (CARAFATE) 1 GM/10ML suspension 1 g  1 g Oral TID WC & HS Rise Patience, MD   1 g at 01/03/17 1206  . tiZANidine (ZANAFLEX) tablet 4 mg  4 mg Oral Q6H PRN Rise Patience, MD        Physical Findings:  height is 5' (1.524 m)  and weight is 136 lb 7.4 oz (61.9 kg). His oral temperature is 97.9 F (36.6 C). His blood pressure is 115/77 and his pulse is 87. His respiration is 18 and oxygen saturation is 97%.  Pain Assessment Pain Score: 0-No pain/10 In general this is a well appearing caucasian male H no acute distress. He's alert and oriented x4 and appropriate throughout the examination. Cardiopulmonary assessment is negative for acute distress and he exhibits normal effort.   Lab Findings: Lab Results  Component Value Date   WBC 7.6 01/02/2017   HGB 10.5 (L) 01/02/2017   HCT 31.2 (L) 01/02/2017   MCV 91.0 01/02/2017   PLT 128 (L) 01/02/2017     Radiographic Findings: Dg Chest 2 View  Result Date: 01/01/2017 CLINICAL DATA:  Congestion, cough and dyspnea x3 days. Esophageal carcinoma. Post chemotherapy and radiation. EXAM: CHEST  2 VIEW COMPARISON:  10/12/2016 CT chest. FINDINGS: The  heart size and mediastinal contours are within normal limits. Right-sided port catheter tip in the cavoatrial junction. Both lungs are clear. No dilatation of the esophagus in this patient with known distal esophageal mass like abnormality on CT. The visualized skeletal structures are unremarkable. IMPRESSION: No active cardiopulmonary disease. Electronically Signed   By: Ashley Royalty M.D.   On: 01/01/2017 21:38   Ct Abdomen Pelvis W Contrast  Result Date: 12/14/2016 CLINICAL DATA:  Per pt, states he has been unable to eat since last Thursday-last BM was last Thursday-states history of stomach cancer dx 09/2016-last radiation/chemo was last week-states some vomiting-complaining of lower abdominal pain radiating to the back. Patient takes morphine for pain. EXAM: CT ABDOMEN AND PELVIS WITH CONTRAST TECHNIQUE: Multidetector CT imaging of the abdomen and pelvis was performed using the standard protocol following bolus administration of intravenous contrast. CONTRAST:  156mL ISOVUE-300 IOPAMIDOL (ISOVUE-300) INJECTION 61% COMPARISON:  10/24/2016, 10/02/2016 FINDINGS: Lower chest: Heart size is normal.  Trace pericardial effusion. Hepatobiliary: No focal liver abnormality is seen. No gallstones, gallbladder wall thickening, or biliary dilatation. Pancreas: Unremarkable. No pancreatic ductal dilatation or surrounding inflammatory changes. Spleen: Normal in size without focal abnormality. Adrenals/Urinary Tract: Adrenal glands are normal in appearance. Kidneys are symmetric in enhancement and excretion. No focal renal mass or hydronephrosis. Stomach/Bowel: There has been significant improvement in the appearance of distal esophageal in proximal stomach wall thickening. Maximum thickness measures 1.1 cm, previously 2.3 cm. Tiny gastrohepatic lymph nodes persist but are significantly smaller, largest measuring approximately 5 mm. Small paraesophageal lymph node is 5 mm on image 9 of series 2. The stomach has a normal  appearance. There is mild dilatation of a short segment of proximal jejunum, measuring up to 3 cm. However remainder of the small bowel loops are normal in appearance. The appendix is well seen and has a normal appearance. Colon is normal in appearance. Vascular/Lymphatic: There is significant atherosclerotic calcification of the abdominal aorta. No aneurysm. Right common iliac lymph node is 7 mm on image 63 of series 2. Reproductive: Prostate is unremarkable. Other: Stable fat containing right inguinal hernia. No free pelvic fluid. Musculoskeletal: No acute or significant osseous findings. IMPRESSION: 1. Mildly dilated short segment of proximal jejunum without bowel wall thickening or significant inflammatory changes. Findings may represent focal ileus or radiation enteritis. No evidence for obstruction. 2. Significantly improved appearance of the distal esophagus/proximal stomach with decreased wall thickening (1.1 cm from 2.3 cm), and smaller paraesophageal and gastrohepatic ligament lymph nodes. 3. No bowel obstruction.  Normal appendix. Electronically Signed   By: Nolon Nations M.D.  On: 12/14/2016 12:04    Impression/Plan: 1. Stage III, cT3N1M0 adenocarcinoma of the GE junction/esophagus. The patient is doing well since completing treatment. We will cancel his appointment with me on Friday. He will return in 6 months' time for continued evaluation. He will be seen for repeat imaging and meet with CT surgery and he's planning esophagectomy on 02/05/17.     Carola Rhine, PAC

## 2017-01-05 ENCOUNTER — Ambulatory Visit: Admission: RE | Admit: 2017-01-05 | Payer: Medicaid Other | Source: Ambulatory Visit | Admitting: Radiation Oncology

## 2017-01-07 LAB — CULTURE, BLOOD (ROUTINE X 2)
CULTURE: NO GROWTH
Culture: NO GROWTH
Special Requests: ADEQUATE

## 2017-01-08 ENCOUNTER — Ambulatory Visit (HOSPITAL_COMMUNITY)
Admission: RE | Admit: 2017-01-08 | Discharge: 2017-01-08 | Disposition: A | Discharge status: Home / Self Care | Payer: Medicaid Other | Source: Ambulatory Visit | Attending: Cardiothoracic Surgery | Admitting: Cardiothoracic Surgery

## 2017-01-08 DIAGNOSIS — Z01818 Encounter for other preprocedural examination: Secondary | ICD-10-CM | POA: Insufficient documentation

## 2017-01-08 DIAGNOSIS — C155 Malignant neoplasm of lower third of esophagus: Secondary | ICD-10-CM | POA: Diagnosis present

## 2017-01-08 LAB — PULMONARY FUNCTION TEST
DL/VA % pred: 136 %
DL/VA: 5.18 ml/min/mmHg/L
DLCO cor % pred: 128 %
DLCO cor: 24.28 ml/min/mmHg
DLCO unc % pred: 110 %
DLCO unc: 20.92 ml/min/mmHg
FEF 25-75 Post: 2.29 L/sec
FEF 25-75 Pre: 2.2 L/sec
FEF2575-%Change-Post: 4 %
FEF2575-%Pred-Post: 103 %
FEF2575-%Pred-Pre: 99 %
FEV1-%Change-Post: 2 %
FEV1-%Pred-Post: 89 %
FEV1-%Pred-Pre: 87 %
FEV1-Post: 2.24 L
FEV1-Pre: 2.19 L
FEV1FVC-%Change-Post: -4 %
FEV1FVC-%Pred-Pre: 104 %
FEV6-%Change-Post: 5 %
FEV6-%Pred-Post: 94 %
FEV6-%Pred-Pre: 89 %
FEV6-Post: 2.92 L
FEV6-Pre: 2.76 L
FEV6FVC-%Change-Post: 0 %
FEV6FVC-%Pred-Post: 104 %
FEV6FVC-%Pred-Pre: 105 %
FVC-%Change-Post: 6 %
FVC-%Pred-Post: 89 %
FVC-%Pred-Pre: 84 %
FVC-Post: 2.94 L
FVC-Pre: 2.76 L
Post FEV1/FVC ratio: 76 %
Post FEV6/FVC ratio: 99 %
Pre FEV1/FVC ratio: 79 %
Pre FEV6/FVC Ratio: 100 %
RV % pred: 144 %
RV: 2.38 L
TLC % pred: 102 %
TLC: 5.12 L

## 2017-01-08 MED ORDER — ALBUTEROL SULFATE (2.5 MG/3ML) 0.083% IN NEBU
2.5000 mg | INHALATION_SOLUTION | Freq: Once | RESPIRATORY_TRACT | Status: AC
  Administered 2017-01-08: 2.5 mg via RESPIRATORY_TRACT

## 2017-01-10 ENCOUNTER — Ambulatory Visit (HOSPITAL_BASED_OUTPATIENT_CLINIC_OR_DEPARTMENT_OTHER): Payer: Medicaid Other

## 2017-01-10 ENCOUNTER — Encounter: Payer: Self-pay | Admitting: *Deleted

## 2017-01-10 ENCOUNTER — Telehealth: Payer: Self-pay | Admitting: Hematology

## 2017-01-10 ENCOUNTER — Other Ambulatory Visit (HOSPITAL_BASED_OUTPATIENT_CLINIC_OR_DEPARTMENT_OTHER): Payer: Medicaid Other

## 2017-01-10 ENCOUNTER — Encounter: Payer: Self-pay | Admitting: Hematology

## 2017-01-10 ENCOUNTER — Ambulatory Visit (HOSPITAL_BASED_OUTPATIENT_CLINIC_OR_DEPARTMENT_OTHER): Payer: Medicaid Other | Admitting: Hematology

## 2017-01-10 VITALS — BP 110/67 | HR 106 | Temp 98.4°F | Resp 18 | Ht 60.0 in | Wt 146.0 lb

## 2017-01-10 DIAGNOSIS — R634 Abnormal weight loss: Secondary | ICD-10-CM | POA: Diagnosis not present

## 2017-01-10 DIAGNOSIS — R1310 Dysphagia, unspecified: Secondary | ICD-10-CM | POA: Diagnosis not present

## 2017-01-10 DIAGNOSIS — C155 Malignant neoplasm of lower third of esophagus: Secondary | ICD-10-CM

## 2017-01-10 DIAGNOSIS — Z95828 Presence of other vascular implants and grafts: Secondary | ICD-10-CM

## 2017-01-10 DIAGNOSIS — K5900 Constipation, unspecified: Secondary | ICD-10-CM | POA: Diagnosis not present

## 2017-01-10 LAB — CBC WITH DIFFERENTIAL/PLATELET
BASO%: 0.1 % (ref 0.0–2.0)
BASOS ABS: 0 10*3/uL (ref 0.0–0.1)
EOS ABS: 0.2 10*3/uL (ref 0.0–0.5)
EOS%: 2.4 % (ref 0.0–7.0)
HEMATOCRIT: 36.2 % — AB (ref 38.4–49.9)
HGB: 11.7 g/dL — ABNORMAL LOW (ref 13.0–17.1)
LYMPH#: 0.8 10*3/uL — AB (ref 0.9–3.3)
LYMPH%: 12.4 % — ABNORMAL LOW (ref 14.0–49.0)
MCH: 30.7 pg (ref 27.2–33.4)
MCHC: 32.3 g/dL (ref 32.0–36.0)
MCV: 95 fL (ref 79.3–98.0)
MONO#: 0.3 10*3/uL (ref 0.1–0.9)
MONO%: 4 % (ref 0.0–14.0)
NEUT%: 81.1 % — ABNORMAL HIGH (ref 39.0–75.0)
NEUTROS ABS: 5.4 10*3/uL (ref 1.5–6.5)
PLATELETS: 142 10*3/uL (ref 140–400)
RBC: 3.81 10*6/uL — ABNORMAL LOW (ref 4.20–5.82)
RDW: 17.9 % — AB (ref 11.0–14.6)
WBC: 6.7 10*3/uL (ref 4.0–10.3)

## 2017-01-10 LAB — COMPREHENSIVE METABOLIC PANEL
ALT: 56 U/L — ABNORMAL HIGH (ref 0–55)
AST: 27 U/L (ref 5–34)
Albumin: 2.8 g/dL — ABNORMAL LOW (ref 3.5–5.0)
Alkaline Phosphatase: 82 U/L (ref 40–150)
Anion Gap: 9 mEq/L (ref 3–11)
BUN: 21.1 mg/dL (ref 7.0–26.0)
CO2: 26 mEq/L (ref 22–29)
Calcium: 8.9 mg/dL (ref 8.4–10.4)
Chloride: 107 mEq/L (ref 98–109)
Creatinine: 0.7 mg/dL (ref 0.7–1.3)
EGFR: >90 mL/min/{1.73_m2} (ref >90)
Glucose: 140 mg/dl (ref 70–140)
Potassium: 3.4 mEq/L — ABNORMAL LOW (ref 3.5–5.1)
Sodium: 142 mEq/L (ref 136–145)
Total Bilirubin: 0.34 mg/dL (ref 0.20–1.20)
Total Protein: 6.3 g/dL — ABNORMAL LOW (ref 6.4–8.3)

## 2017-01-10 MED ORDER — HEPARIN SOD (PORK) LOCK FLUSH 100 UNIT/ML IV SOLN
500.0000 [IU] | Freq: Once | INTRAVENOUS | Status: AC
  Administered 2017-01-10: 500 [IU]
  Filled 2017-01-10: qty 5

## 2017-01-10 MED ORDER — SODIUM CHLORIDE 0.9% FLUSH
10.0000 mL | Freq: Once | INTRAVENOUS | Status: AC
  Administered 2017-01-10: 10 mL
  Filled 2017-01-10: qty 10

## 2017-01-10 NOTE — Telephone Encounter (Signed)
Scheduled appt per 6/13 los - lab and f/u in 2 months - Gave patient AVS and calender .

## 2017-01-13 ENCOUNTER — Encounter: Payer: Self-pay | Admitting: Hematology

## 2017-01-18 ENCOUNTER — Encounter: Payer: Self-pay | Admitting: Cardiothoracic Surgery

## 2017-01-18 ENCOUNTER — Ambulatory Visit
Admission: RE | Admit: 2017-01-18 | Discharge: 2017-01-18 | Disposition: A | Discharge status: Home / Self Care | Payer: Medicaid Other | Source: Ambulatory Visit | Attending: Cardiothoracic Surgery | Admitting: Cardiothoracic Surgery

## 2017-01-18 ENCOUNTER — Encounter (HOSPITAL_COMMUNITY): Payer: Medicaid Other

## 2017-01-18 ENCOUNTER — Ambulatory Visit (INDEPENDENT_AMBULATORY_CARE_PROVIDER_SITE_OTHER): Payer: Medicaid Other | Admitting: Cardiothoracic Surgery

## 2017-01-18 VITALS — BP 110/70 | HR 98 | Resp 16 | Ht 61.0 in | Wt 146.0 lb

## 2017-01-18 DIAGNOSIS — Z01818 Encounter for other preprocedural examination: Secondary | ICD-10-CM

## 2017-01-18 DIAGNOSIS — C159 Malignant neoplasm of esophagus, unspecified: Secondary | ICD-10-CM

## 2017-01-18 DIAGNOSIS — Z923 Personal history of irradiation: Secondary | ICD-10-CM | POA: Diagnosis not present

## 2017-01-18 DIAGNOSIS — Z9221 Personal history of antineoplastic chemotherapy: Secondary | ICD-10-CM

## 2017-01-18 DIAGNOSIS — C155 Malignant neoplasm of lower third of esophagus: Secondary | ICD-10-CM

## 2017-01-18 MED ORDER — IOPAMIDOL (ISOVUE-300) INJECTION 61%
100.0000 mL | Freq: Once | INTRAVENOUS | Status: AC | PRN
  Administered 2017-01-18: 100 mL via INTRAVENOUS

## 2017-01-18 NOTE — Progress Notes (Signed)
[address]       [place] [address]             [phone]                    Darrell [place] Medical Record #[id number] Date of Birth: 01/17/1961  Referring: [name], Cunningham Primary Care: [name], [place]  Chief Complaint:    Chief Complaint  Patient presents with  . Follow-up    after CT C/A/P 01/18/17 and PFT 01/08/17    History of Present Illness:    Darrell Cunningham 56 y.o. male is seen in the office  today to further discuss resection for esophageal cancer.He had restaging ct done today .  He has completed a course of radiation and chemotherapy . He had been considering proceeding with surgical resection in early July.  His symptoms began with increasing difficulty with pain with swallowing and with regurgitation. Because of these symptoms he underwent upper GI endoscopy by Dr. [name].  fungating mass extending up proximally 7 cm in the distal esophagus. CT scan suggests esophageal lymph nodes and a 2.3 cm mass. Biopsy were taken suggestive of adenocarcinoma but not diagnostic. Repeat biopsy was done 3/29 [id number] Diagnosis Esophagogastric junction, biopsy, mass - INVASIVE ADENOCARCINOMA. Microscopic Comment Dr. Lyndon Code has seen this case in consultation with agreement. The findings are called to Dr. Ardis Hughs on 10/27/2016. (RH:ecj 10/27/2016) Darrell [place]   Patient's past history is significant for mild cerebral palsy involving the right arm and leg and curvature of the spine. He is currently a nonsmoker having quit 4 years ago prior to that he smoked 1-2 packs per day for 38 years. He denies any alcohol intake for the last 20 years.  Patient completed chemotherapy on May 8 and radiation on May 10  Wt Readings from Last 3 Encounters:  01/18/17 146 lb (66.2 kg)  01/10/17 146 lb (66.2 kg)  01/01/17 136 lb 7.4 oz (61.9 kg)   Current Activity/ Functional Status:  Patient is independent with mobility/ambulation, transfers,  ADL's, IADL's.   Zubrod Score: At the time of surgery this patient's most appropriate activity status/level should be described as: []     0    Normal activity, no symptoms [x]     1    Restricted in physical strenuous activity but ambulatory, able to do out light work []     2    Ambulatory and capable of self care, unable to do work activities, up and about               >50 % of waking hours                              []     3    Only limited self care, in bed greater than 50% of waking hours []     4    Completely disabled, no self care, confined to bed or chair []     5    Moribund   Past Medical History:  Diagnosis Date  . Allergy   . Arthritis   . Asthma    as a child  . Cancer ([place])   . CP (cerebral palsy), spastic ([place])    right  . Dysphagia   . Encounter for nonprocreative genetic counseling 10/31/2016   Mr. Favaro underwent genetic counseling for hereditary cancer syndromes on 10/31/2016. Though he  is a candidate for genetic testing, he declines at this time.  . Hyperlipidemia   . Neuromuscular disorder ([place])   . Pneumonia 4 yrs ago    Past Surgical History:  Procedure Laterality Date  . EUS N/A 10/26/2016   Procedure: UPPER ENDOSCOPIC ULTRASOUND (EUS) LINEAR;  Surgeon: [name], [place];  Location: [facility];  Service: Endoscopy;  Laterality: N/A;  . EYE SURGERY Bilateral age 31    for cross eyes  . IR FLUORO GUIDE PORT INSERTION RIGHT  11/08/2016  . IR US GUIDE VASC ACCESS RIGHT  11/08/2016  . MOUTH SURGERY      Family History  Problem Relation Age of Onset  . Colon cancer Maternal Grandmother 58  . Breast cancer Maternal Grandmother 39  . Breast cancer Sister 79       Deceased at 82 of breast cancer  . Ovarian cancer Maternal Aunt   . Stomach cancer Neg Hx   . Esophageal cancer Neg Hx     Social History   Social History  . Marital status: Legally Separated    Spouse name: N/A  . Number of children: N/A  . Years of education: N/A   Occupational  History  . Not on file.   Social History Main Topics  . Smoking status: Former Smoker    Packs/day: 1.00    Years: 38.00    Types: Cigarettes    Start date: 10/31/2012    Quit date: 10/31/2012  . Smokeless tobacco: Never Used  . Alcohol use No  . Drug use: No  . Sexual activity: Not on file   Other Topics Concern  . Not on file   Social History Narrative  . No narrative on file    History  Smoking Status  . Former Smoker  . Packs/day: 1.00  . Years: 38.00  . Types: Cigarettes  . Start date: 10/31/2012  . Quit date: 10/31/2012  Smokeless Tobacco  . Never Used    History  Alcohol Use No     Allergies  Allergen Reactions  . Penicillins     Has patient had a PCN reaction causing immediate rash, facial/tongue/throat swelling, SOB or lightheadedness with hypotension: No Has patient had a PCN reaction causing severe rash involving mucus membranes or skin necrosis: No Has patient had a PCN reaction that required hospitalization No Has patient had a PCN reaction occurring within the last 10 years: No If all of the above answers are "NO", then may proceed with Cephalosporin use. Unknown Childhood reaction    Current Outpatient Prescriptions  Medication Sig Dispense Refill  . acetaminophen (TYLENOL) 325 MG tablet Take 2 tablets (650 mg total) by mouth every 6 (six) hours as needed for mild pain (or Fever >/= 101). 30 tablet 0  . fluticasone (FLONASE) 50 MCG/ACT nasal spray Place 1 spray into both nostrils daily as needed for allergies or rhinitis.    Marland Kitchen ipratropium-albuterol (DUONEB) 0.5-2.5 (3) MG/3ML SOLN Take 3 mLs by nebulization every 6 (six) hours as needed. 360 mL 0  . lidocaine-prilocaine (EMLA) cream Apply 1 application topically as needed. Apply to portacath  1 1/2 hours - 2 hours prior to procedures as needed. 30 g 1  . oxyCODONE-acetaminophen (PERCOCET/ROXICET) 5-325 MG tablet Take 1 tablet by mouth every 4 (four) hours as needed for severe pain. 15 tablet 0  .  pantoprazole (PROTONIX) 40 MG tablet Take 1 tablet (40 mg total) by mouth 2 (two) times daily. 60 tablet 3  . PROAIR HFA 108 (90  Base) MCG/ACT inhaler INHALE 2 PUFFS INTO THE LUNGS EVERY 6 (SIX) HOURS AS NEEDED FOR WHEEZING OR SHORTNESS OF BREATH. 18 g 0  . sucralfate (CARAFATE) 1 GM/10ML suspension Take 10 mLs (1 g total) by mouth 4 (four) times daily -  with meals and at bedtime. 420 mL 0  . tiZANidine (ZANAFLEX) 4 MG tablet TAKE 1 TABLET (4 MG TOTAL) BY MOUTH EVERY 6 (SIX) HOURS AS NEEDED FOR MUSCLE SPASMS. 30 tablet 0  . ondansetron (ZOFRAN) 8 MG tablet Take 1 tablet (8 mg total) by mouth 2 (two) times daily as needed for refractory nausea / vomiting. Start on day 3 after chemo. (Patient not taking: Reported on 01/10/2017) 30 tablet 1  . prochlorperazine (COMPAZINE) 10 MG tablet Take 1 tablet (10 mg total) by mouth every 6 (six) hours as needed (Nausea or vomiting). (Patient not taking: Reported on 01/10/2017) 30 tablet 1   No current facility-administered medications for this visit.       Review of Systems:     Cardiac Review of Systems: Y or N  Chest Pain [ n   ]  Resting SOB [ n] Exertional SOB  [n  ]  Orthopnea Florencio.Farrier ]   Pedal Edema Florencio.Farrier   ]    Palpitations Florencio.Farrier  ] Syncope  Florencio.Farrier  ]   Presyncope [ n  ]  General Review of Systems: [Y] = yes [  ]=no Constitional: recent weight change [  ];  Wt loss over the last 3 months [   ] anorexia [  ]; fatigue [  ]; nausea [  ]; night sweats [  ]; fever [  ]; or chills [  ];          Dental: poor dentition[  ]; Last Dentist visit:   Eye : blurred vision [  ]; diplopia [   ]; vision changes [  ];  Amaurosis fugax[  ]; Resp: cough [  ];  wheezing[  ];  hemoptysis[  ]; shortness of breath[  ]; paroxysmal nocturnal dyspnea[  ]; dyspnea on exertion[  ]; or orthopnea[  ];  GI:  gallstones[  ], vomiting[  ];  dysphagia[  ]; melena[  ];  hematochezia [  ]; heartburn[  ];   Hx of  Colonoscopy[  ]; GU: kidney stones [  ]; hematuria[  ];   dysuria [  ];  nocturia[  ];   history of     obstruction [  ]; urinary frequency [  ]             Skin: rash, swelling[  ];, hair loss[  ];  peripheral edema[  ];  or itching[  ]; Musculosketetal: myalgias[  ];  joint swelling[  ];  joint erythema[  ];  joint pain[  ];  back pain[  ];  Heme/Lymph: bruising[  ];  bleeding[  ];  anemia[  ];  Neuro: TIA[  ];  headaches[  ];  stroke[  ];  vertigo[  ];  seizures[  ];   paresthesias[  ];  difficulty walking[  ];  Psych:depression[  ]; anxiety[  ];  Endocrine: diabetes[  ];  thyroid dysfunction[  ];  Immunizations: Flu up to date [  ]; Pneumococcal up to date [  ];  Other:  Physical Exam: BP 110/70 (BP Location: Right Arm, Patient Position: Sitting, Cuff Size: Large)   Pulse 98   Resp 16   Ht 5\' 1"  (1.549 m)   Wt 146 lb (66.2  kg)   SpO2 98% Comment: ON RA  BMI 27.59 kg/m   PHYSICAL EXAMINATION: General appearance: alert and cooperative has gained some weight Head: Normal Neck: no palpable adenopathy, no carotid bruit, no JVD, supple, symmetrical, trachea midline and thyroid not enlarged, symmetric, no tenderness/mass/nodules Lymph nodes: Cervical, supraclavicular, and axillary nodes normal. Resp: Lungs clear bilaterally Back: symmetric, no curvature. ROM normal. No CVA tenderness. Cardio: regular rate and rhythm, S1, S2 normal, no murmur or gallop GI: soft, nontender without double abdominal masses  Extremities: extremities normal, atraumatic, no cyanosis or edema and Homans sign is negative, no sign of DVT,  Neurologic: Grossly normal  Diagnostic Studies & Laboratory data:   Diagnosis Esophagus, biopsy, distal esophageal tumor (33-39) - SUSPICIOUS FOR ADENOCARCINOMA, SEE COMMENT. Microscopic Comment The majority of fragments are reactive squamous mucosa with associated acute inflammation. There is a small fragment of atypical glandular epithelium with associated ulceration that is suspicious for adenocarcinoma. There is no background Barrett's esophagus in the  glandular component. Dr. Saralyn Pilar has reviewed the case. The case was called to Dr. [name] on 10/10/2016. [name] [place] Pathologist, Electronic  - A large, fungating mass with no bleeding until contact was found in the lower third of the esophagus, 33 cm from the incisors and extending to the GE junction at 39 cm. The mass was partially obstructing and partially circumferential (involving one-half of the lumen circumference). Multiple biopsies were obtained with cold forceps for histology in a targeted manner. Findings: - The perviously described mass was also seen on retroflexion in the stomach at the gastroesophageal junction. - The exam of the stomach was otherwise normal. - The examined duodenum was normal.      EUS  Endoscopic Finding Findings: A medium-sized, fungating, very friable mass was found at the gastroesophageal junction. The 6cm long mass was partially obstructing and partially circumferential (involving two thirds of the lumen circumference). The bulk of the mass lays in the distal esophagus but it does stradle the GE junction with about 2cm of tumor below the Z line. Biopsies were taken with a cold forceps for histology. Endosonographic Finding 1. The mass above correlated with a hypoechoic and heterogenous lesion at the gastroesophageal junction. This ranges from 33 cm from the incisors and extended to 39 cm. The lesion was partially circumferential (involving two-thirds of the lumen circumference). The endosonographic borders were poorly-defined. The mass measured up to 16 mm in thickness. There was sonographic evidence suggesting invasion into the adventitia (Layer 5, UT3). 2. There was one 1.3cm round, hypoechoic, heterogeneous lymphnode adjacent to the mass. This is presumed to be malignant based on EUS criteria (uN1). 3. Limited views of pancreas, spleen, liver were all normal.    Recent Radiology Findings:   Dg Chest 2 View  Result Date:  01/01/2017 CLINICAL DATA:  Congestion, cough and dyspnea x3 days. Esophageal carcinoma. Post chemotherapy and radiation. EXAM: CHEST  2 VIEW COMPARISON:  10/12/2016 CT chest. FINDINGS: The heart size and mediastinal contours are within normal limits. Right-sided port catheter tip in the cavoatrial junction. Both lungs are clear. No dilatation of the esophagus in this patient with known distal esophageal mass like abnormality on CT. The visualized skeletal structures are unremarkable. IMPRESSION: No active cardiopulmonary disease. Electronically Signed   By: Ashley Royalty M.D.   On: 01/01/2017 21:38   Result Date: 01/18/2017 CLINICAL DATA:  Restaging esophageal cancer. EXAM: CT CHEST, ABDOMEN, AND PELVIS WITH CONTRAST TECHNIQUE: Multidetector CT imaging of the chest, abdomen and pelvis was performed following  the standard protocol during bolus administration of intravenous contrast. CONTRAST:  131mL ISOVUE-300 IOPAMIDOL (ISOVUE-300) INJECTION 61% COMPARISON:  12/14/2016 abdominal/ pelvic CT scan and PET-CT 10/24/2016 FINDINGS: CT CHEST FINDINGS Cardiovascular: The heart is normal in size. No pericardial effusion. Mild stable tortuosity and calcification of the thoracic aorta and branch vessels. No focal aneurysm or dissection. Stable age advanced three-vessel coronary artery calcifications. Right-sided Port-A-Cath in good position without complicating features. Mediastinum/Nodes: No mediastinal or hilar mass or lymphadenopathy. Mild wall stable distal esophageal wall thickening likely due to radiation. No obvious recurrent esophageal mass. Small paraesophageal lymph nodes. The largest measures 3.5 mm on image number 20. This is stable when compared to the prior PET-CT. Lungs/Pleura: Stable mild/early emphysematous changes. No acute pulmonary findings. No worrisome pulmonary nodules to suggest pulmonary metastatic disease. No pleural effusion or pleural nodules. Chest wall/ Musculoskeletal: Right-sided Port-A-Cath is  stable. No breast masses, supraclavicular or axillary lymphadenopathy. The thyroid gland appears normal. No significant bony findings. No evidence of osseous metastatic disease. CT ABDOMEN PELVIS FINDINGS Hepatobiliary: No focal hepatic lesions to suggest metastatic disease. The gallbladder is normal. No common bile duct dilatation. Pancreas: No mass, inflammation or ductal dilatation. Spleen: Normal size.  No focal lesions. Adrenals/Urinary Tract: The adrenal glands and kidneys are unremarkable and stable. No renal, ureteral or bladder calculi or mass. Stomach/Bowel: The stomach, duodenum, small bowel and colon are unremarkable. No inflammatory changes, mass lesions or obstructive findings. Vascular/Lymphatic: Stable age advanced atherosclerotic calcifications involving the aorta and iliac arteries. No focal aneurysm or dissection. The branch vessels are patent. The major venous structures are patent. No mesenteric or retroperitoneal mass or lymphadenopathy. Small scattered lymph nodes are stable. Reproductive: The prostate gland and seminal vesicles are unremarkable. Other: No pelvic mass or adenopathy. No free pelvic fluid collections. No inguinal mass or adenopathy. No abdominal wall hernia or subcutaneous lesions. Musculoskeletal: No significant bony findings. IMPRESSION: 1. Mild stable distal esophageal wall thickening likely due to radiation change. No findings for recurrent tumor. Small paraesophageal lymph nodes are also stable. 2. No findings for metastatic disease. 3. Stable mild/early emphysematous changes and age advanced atherosclerotic calcifications involving the thoracic and abdominal aorta and branch vessels. Electronically Signed   By: Marijo Sanes M.D.   On: 01/18/2017 12:42   I have independently reviewed the above radiology studies  and reviewed the findings with the patient and his parents.   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 Results of the scan are noted above without any evidence of distant disease. However now the patient has decided he does not wish to proceed with resection. This decision may have been  influenced by his  recent admission to hospital with fever and told he had pneumonia.  Consider repeat scan 4 months       [name] Cunningham      [address] [place] [address] Office [phone]   Beeper [phone]  01/18/2017 4:21 PM

## 2017-01-21 ENCOUNTER — Other Ambulatory Visit: Payer: Self-pay | Admitting: Family Medicine

## 2017-01-21 DIAGNOSIS — K222 Esophageal obstruction: Principal | ICD-10-CM

## 2017-01-21 DIAGNOSIS — K219 Gastro-esophageal reflux disease without esophagitis: Secondary | ICD-10-CM

## 2017-03-12 NOTE — Progress Notes (Signed)
[place]  [phone] [phone]  Clinic Follow-up Note   Patient Care Team: [name], MD as PCP - General (Family Medicine) 03/14/2017   CHIEF COMPLAINTS:  Follow up esophageal adenocarcinoma   Oncology History   Cancer Staging Esophageal cancer [name]) Staging form: Esophagus - Adenocarcinoma, AJCC 8th Edition - Clinical stage from 10/26/2016: Stage III (cT3, cN1, cM0) - Signed by [name], MD on 11/05/2016       Malignant neoplasm of lower third of esophagus ([place])   09/21/2016 - 09/21/2016 Hospital Admission    esophageal pain and vomiting up blood      10/09/2016 Initial Diagnosis    Esophageal cancer ([place])      10/09/2016 Procedure    EGD 1. Partially obstructing, likely malignant esophageal tumor was found in the lower third of the esophagus. Multiple biopsies.  2. Mass visible during gastric retroflexion at GE junction 3. Otherwise normal stomach 4. Normal examined duodenum       10/09/2016 Pathology Results    Esophagus, biopsy, distal esophageal tumor (33-39) - SUSPICIOUS FOR ADENOCARCINOMA      10/12/2016 Imaging    CT CAP w Contrast IMPRESSION: Distal esophageal mass compatible with primary esophageal malignancy. There are 2 adjacent abnormal appearing subcentimeter paraesophageal lymph nodes which may represent nodal metastasis. Additionally there is a prominent nonspecific 8 mm upper abdominal lymph node.  No evidence for distant metastatic disease in the chest, abdomen or pelvis.      10/24/2016 PET scan    1. Markedly hypermetabolic distal esophageal lesion, compatible with malignancy. Adjacent small paraesophageal lymph nodes are abnormal by CT but cannot be resolved as separate structures from the hypermetabolic esophageal activity on the PET images. No hypermetabolism is demonstrated in the upper abdominal/gastrohepatic ligament lymph node although the small size of this lymph node may be below  threshold for detection on PET imaging. 2. No evidence for distant hypermetabolic metastatic disease in the neck, chest, abdomen, or pelvis.      10/26/2016 Pathology Results    Esophagogastric junction, biopsy, mass - INVASIVE ADENOCARCINOMA.      10/26/2016 Procedure    EUS showed uT3N1 disease, and biopsy confirmed adenocarcinoma       10/30/2016 - 12/07/2016 Radiation Therapy    Neoadjuvant radiation to esophageal cancer  Under the care of Dr. [name]      10/31/2016 - 12/07/2016 Neo-Adjuvant Chemotherapy    Weekly Carboplatin AUC 2 and taxol 50mg /m2 with concurrent radiation        12/14/2016 Imaging    CT AP W Contrast 12/14/16 IMPRESSION: 1. Mildly dilated short segment of proximal jejunum without bowel wall thickening or significant inflammatory changes. Findings may represent focal ileus or radiation enteritis. No evidence for obstruction. 2. Significantly improved appearance of the distal esophagus/proximal stomach with decreased wall thickening (1.1 cm from 2.3 cm), and smaller paraesophageal and gastrohepatic ligament lymph nodes. 3. No bowel obstruction.  Normal appendix.      01/18/2017 Imaging    CT CAP W Contrast 01/18/17 IMPRESSION: 1. Mild stable distal esophageal wall thickening likely due to radiation change. No findings for recurrent tumor. Small paraesophageal lymph nodes are also stable. 2. No findings for metastatic disease. 3. Stable mild/early emphysematous changes and age advanced atherosclerotic calcifications involving the thoracic and abdominal aorta and branch vessels.      HISTORY OF PRESENTING ILLNESS (10/16/2016):  Darrell Cunningham 56 y.o. male is here because of an esophageal mass that is suspicious for adenocarcinoma. The pt  presented to the ED on 09/21/2016 for esophageal pain and vomiting up blood. He had been struggling with regurgitating food or drinks, difficulty swallowing, and throat pain for 3 weeks prior. He was referred by his PCP to  GI who ordered him an EGD with dilation with Dr. [name] as soon as possible. The EGD and biopsy was performed on 10/09/2016, which showed suspicion of adenocarcinoma of the esophagus and a CT scan was ordered. The CT results demonstrated a distal esophageal mass compatible with primary esophageal malignancy. He is here to discuss treatment.   When he has trouble swallowing, he has to stand up and push his head back to get the food down. He has mostly eating oatmeal, Ensure, and other soft foods. He drinks 2-3 Ensure a day. He has cut out coffee and acidic foods to try to relieve his pain. It is not getting better, though. He isn't able to eat much due to this pain and difficulty swallowing. He does not take any pain medication. He has lost about 13 pounds. He also has some left sided abdominal pain. At night, he has a lot of acid reflux and sputum production. Denies constipation, diarrhea, nausea, SOB, or any other concerns.   He has a history of cerebral palsy on his right side. He has some back pain due to a curvature in his spine. History of asthma. His grandmother had breast cancer and colon cancer. His sister passed of breast cancer at age 16. His aunt had ovarian cancer. No other family history of cancer. He quit smoking 4 years ago. 38 pack years, 1-1.5 ppd. He lives at home with his 32 y.o. Daughter. His parents live about 15 minutes away.   CURRENT THERAPY: observation   INTERVAL HISTORY:   The patient returns for follow up. He presents to the clinic today with his family. He saw Dr. [name]. And he decided not to do surgery. His mother reports his concerns with complications and life after surgery. He does understand this cancer is the only way to cure the cancer. Dr. [name] wants him to have another CT scan before seeing him again. He will get pain in his upper left abdomen and his left side of throat.  He is still taking Percocet and not take it if he can handle it. His mother reports he is  still doing things and being active. She wonders if he should keep port.     MEDICAL HISTORY:  Past Medical History:  Diagnosis Date  . Allergy   . Arthritis   . Asthma    as a child  . Cancer ([place])   . CP (cerebral palsy), spastic ([place])    right  . Dysphagia   . Encounter for nonprocreative genetic counseling 10/31/2016   Mr. Schwebach underwent genetic counseling for hereditary cancer syndromes on 10/31/2016. Though he is a candidate for genetic testing, he declines at this time.  . Hyperlipidemia   . Neuromuscular disorder ([place])   . Pneumonia 4 yrs ago    SURGICAL HISTORY: Past Surgical History:  Procedure Laterality Date  . EUS N/A 10/26/2016   Procedure: UPPER ENDOSCOPIC ULTRASOUND (EUS) LINEAR;  Surgeon: [name], MD;  Location: [facility] END[facility]COPY;  Service: Endoscopy;  Laterality: N/A;  . EYE SURGERY Bilateral age 31    for cross eyes  . IR FLUORO GUIDE PORT INSERTION RIGHT  11/08/2016  . IR US GUIDE VASC ACCESS RIGHT  11/08/2016  . MOUTH SURGERY      SOCIAL HISTORY: Social  History   Social History  . Marital status: Legally Separated    Spouse name: N/A  . Number of children: N/A  . Years of education: N/A   Occupational History  . Not on file.   Social History Main Topics  . Smoking status: Former Smoker    Packs/day: 1.00    Years: 38.00    Types: Cigarettes    Start date: 10/31/2012    Quit date: 10/31/2012  . Smokeless tobacco: Never Used  . Alcohol use No  . Drug use: No  . Sexual activity: Not on file   Other Topics Concern  . Not on file   Social History Narrative  . No narrative on file    FAMILY HISTORY: Family History  Problem Relation Age of Onset  . Colon cancer Maternal Grandmother 65  . Breast cancer Maternal Grandmother 47  . Breast cancer Sister 65       Deceased at 39 of breast cancer  . Ovarian cancer Maternal Aunt   . Stomach cancer Neg Hx   . Esophageal cancer Neg Hx     ALLERGIES:  is allergic to  penicillins.  MEDICATIONS:  Current Outpatient Prescriptions  Medication Sig Dispense Refill  . acetaminophen (TYLENOL) 325 MG tablet Take 2 tablets (650 mg total) by mouth every 6 (six) hours as needed for mild pain (or Fever >/= 101). 30 tablet 0  . fluticasone (FLONASE) 50 MCG/ACT nasal spray Place 1 spray into both nostrils daily as needed for allergies or rhinitis.    Marland Kitchen ipratropium-albuterol (DUONEB) 0.5-2.5 (3) MG/3ML SOLN Take 3 mLs by nebulization every 6 (six) hours as needed. 360 mL 0  . lidocaine-prilocaine (EMLA) cream Apply 1 application topically as needed. Apply to portacath  1 1/2 hours - 2 hours prior to procedures as needed. 30 g 1  . ondansetron (ZOFRAN) 8 MG tablet Take 1 tablet (8 mg total) by mouth 2 (two) times daily as needed for refractory nausea / vomiting. Start on day 3 after chemo. 30 tablet 1  . oxyCODONE-acetaminophen (PERCOCET/ROXICET) 5-325 MG tablet Take 1 tablet by mouth every 4 (four) hours as needed for severe pain. 15 tablet 0  . pantoprazole (PROTONIX) 40 MG tablet TAKE 1 TABLET BY MOUTH TWICE A DAY 60 tablet 11  . PROAIR HFA 108 (90 Base) MCG/ACT inhaler INHALE 2 PUFFS INTO THE LUNGS EVERY 6 (SIX) HOURS AS NEEDED FOR WHEEZING OR SHORTNESS OF BREATH. 18 g 0  . prochlorperazine (COMPAZINE) 10 MG tablet Take 1 tablet (10 mg total) by mouth every 6 (six) hours as needed (Nausea or vomiting). 30 tablet 1  . sucralfate (CARAFATE) 1 GM/10ML suspension Take 10 mLs (1 g total) by mouth 4 (four) times daily -  with meals and at bedtime. 420 mL 0  . tiZANidine (ZANAFLEX) 4 MG tablet TAKE 1 TABLET (4 MG TOTAL) BY MOUTH EVERY 6 (SIX) HOURS AS NEEDED FOR MUSCLE SPASMS. 30 tablet 0   No current facility-administered medications for this visit.    REVIEW OF SYSTEMS:  Constitutional: Denies fevers, chills or abnormal night sweats (+) weight improved (+) fair appetite Eyes: Denies blurriness of vision, double vision or watery eyes Ears, nose, mouth, throat, and face:  Denies mucositis or sore throat (+) throat pain (+) trouble swallowing solids, improved Respiratory: Denies cough, dyspnea or wheezes  Cardiovascular: Denies palpitation, chest discomfort or lower extremity swelling Gastrointestinal:  Denies nausea, heartburn or change in bowel habits (+) abdominal pain (+) constipation improved Skin: Denies abnormal skin rashes (+)  right arm red and swollen (+) itching  Lymphatics: Denies new lymphadenopathy or easy bruising Neurological:Denies numbness, tingling or new weaknesses  Musculoskeletal: Negative Behavioral/Psych: Mood is stable, no new changes (+) insomnia  All other systems were reviewed with the patient and are negative.  PHYSICAL EXAMINATION: ECOG PERFORMANCE STATUS: 1 - Symptomatic but completely ambulatory  Vitals:   03/14/17 0919  BP: 115/68  Pulse: 85  Resp: 17  Temp: 97.8 F (36.6 C)  SpO2: 100%   Filed Weights   03/14/17 0919  Weight: 159 lb 12.8 oz (72.5 kg)    GENERAL:alert, no distress and comfortable (+) limp from right sided cerebral palsy SKIN: skin color, texture, turgor are normal, no rashes or significant lesions EYES: normal, conjunctiva are pink and non-injected, sclera clear OROPHARYNX:no exudate, no erythema and lips, buccal mucosa, and tongue normal  NECK: supple, thyroid normal size, non-tender, without nodularity LYMPH:  no palpable lymphadenopathy in the cervical, axillary or inguinal LUNGS: clear to auscultation and percussion with normal breathing effort (+) wheezing right lung HEART: regular rate & rhythm and no murmurs and no lower extremity edema ABDOMEN:abdomen soft, non-tender and normal bowel sounds (+) tenderness  Musculoskeletal:no cyanosis of digits and no clubbing  PSYCH: alert & oriented x 3 with fluent speech NEURO: no focal motor/sensory deficits  LABORATORY DATA:  I have reviewed the data as listed CBC Latest Ref Rng & Units 03/14/2017 01/10/2017 01/02/2017  WBC 4.0 - 10.3 10e3/uL 4.5 6.7  7.6  Hemoglobin 13.0 - 17.1 g/dL 11.8(L) 11.7(L) 10.5(L)  Hematocrit 38.4 - 49.9 % 35.5(L) 36.2(L) 31.2(L)  Platelets 140 - 400 10e3/uL 159 142 128(L)   CMP Latest Ref Rng & Units 03/14/2017 01/10/2017 01/02/2017  Glucose 70 - 140 mg/dl 112 140 101(H)  BUN 7.0 - 26.0 mg/dL 16.0 21.1 9  Creatinine 0.7 - 1.3 mg/dL 0.7 0.7 0.59(L)  Sodium 136 - 145 mEq/L 140 142 137  Potassium 3.5 - 5.1 mEq/L 4.2 3.4(L) 3.5  Chloride 101 - 111 mmol/L - - 106  CO2 22 - 29 mEq/L 24 26 25   Calcium 8.4 - 10.4 mg/dL 9.2 8.9 8.7(L)  Total Protein 6.4 - 8.3 g/dL 6.5 6.3(L) -  Total Bilirubin 0.20 - 1.20 mg/dL 0.30 0.34 -  Alkaline Phos 40 - 150 U/L 111 82 -  AST 5 - 34 U/L 25 27 -  ALT 0 - 55 U/L 36 56(H) -   PATHOLOGY:  Diagnosis 10/26/2016 Esophagogastric junction, biopsy, mass - INVASIVE ADENOCARCINOMA.  Diagnosis 10/09/2016 Esophagus, biopsy, distal esophageal tumor (33-39) - SUSPICIOUS FOR ADENOCARCINOMA, SEE COMMENT.Marland Kitchen  RADIOGRAPHIC STUDIES: I have personally reviewed the radiological images as listed and agreed with the findings in the report.   CT CAP W Contrast 01/18/17 IMPRESSION: 1. Mild stable distal esophageal wall thickening likely due to radiation change. No findings for recurrent tumor. Small paraesophageal lymph nodes are also stable. 2. No findings for metastatic disease. 3. Stable mild/early emphysematous changes and age advanced atherosclerotic calcifications involving the thoracic and abdominal aorta and branch vessels.   CT AP W Contrast 12/14/16 IMPRESSION: 1. Mildly dilated short segment of proximal jejunum without bowel wall thickening or significant inflammatory changes. Findings may represent focal ileus or radiation enteritis. No evidence for obstruction. 2. Significantly improved appearance of the distal esophagus/proximal stomach with decreased wall thickening (1.1 cm from 2.3 cm), and smaller paraesophageal and gastrohepatic ligament lymph nodes. 3. No bowel  obstruction.  Normal appendix.  PET 10/24/2016 IMPRESSION: 1. Markedly hypermetabolic distal esophageal lesion, compatible with malignancy. Adjacent  small paraesophageal lymph nodes are abnormal by CT but cannot be resolved as separate structures from the hypermetabolic esophageal activity on the PET images. No hypermetabolism is demonstrated in the upper abdominal/gastrohepatic ligament lymph node although the small size of this lymph node may be below threshold for detection on PET imaging. 2. No evidence for distant hypermetabolic metastatic disease in the neck, chest, abdomen, or pelvis.  CT CAP w Contrast 10/12/2016 IMPRESSION: Distal esophageal mass compatible with primary esophageal malignancy. There are 2 adjacent abnormal appearing subcentimeter paraesophageal lymph nodes which may represent nodal metastasis. Additionally there is a prominent nonspecific 8 mm upper abdominal lymph node.  No evidence for distant metastatic disease in the chest, abdomen or pelvis.  EGD 10/09/2016   ASSESSMENT & PLAN:  Samel is a 56 y.o. Caucasian male with PMH cerebral palsy and heavy smoking, who presents with dysphagia, odynophagia and weight loss   1. Low esophageal Adenocarcinoma, cT3N1M0, stage III -I previously reviewed the imaging and biopsy pathology results with the patient in detail. Although his biopsy pathology was not definitive for malignancy, based on his endoscopy findings, this is most consistent with esophageal adenocarcinoma. -I previously reviewed his PET scan images with patient in person, which showed intense hypermetabolic esophageal cancer, no other distant metastasis -I reviewed the EUS and re-biopsy results, which showed T3N1 disease, and biopsy confirmed adenocarcinoma -We previously reviewed the standard care for locally advanced esophageal cancer, including neoadjuvant chemoradiation, followed by esophagectomy.  -He was previously referred to see thoracic surgeon  Dr. Pia Mau. -He has completed neoadjuvant concurrent chemoradiation with weekly carbo and Taxol, tolerated well overall  -His dysphagia has significantly improved, odynophagia is resolving -12/14/16 CT abdomen scan reviewed and showed good response in shrinking of tumor.  -He is scheduled to have a repeat scan on June/2018 before surgery. -labs reviewed previously and anemia improved since his hospitalization and potassium is slightly low. I encouraged him to eat more bananna in his diet.  -His surgery will be in early July, 2018. I encouraged his to continue gaining weight pre-surgery to get his energy up. Will repeat scan after surgery for monitoring.  -We discussed that post surgery we will monitor him with scans every 6 months for a few years then yearly for a total of 5 years  -We reviewed his CT CAP from 01/18/17 shows showed no evidence of metastasis  -I discussed the risks and benefits of surgery with the Pt and his family who shared their concerns of complications from surgery and quality of life post operation. I discussed how waiting for surgery can effect his cancer and how surgery is the only way to cure his cancer. He understands. I encourage him to reconsider surgery.  -after lengthy discussion,  Pt is more open to have surgical resection.  He will call his surgeon Dr. Pia Mau to move up his appointment -Labs reviewed and his CBC and CMP overall adequate.   -Will send note to Dr. [name].  -Will f/u in 3 months or sooner if he chooses to do surgery -We discussed a immunotherapy trial after surgical resection for esophageal cancer. I may refer him to [facility] if he is interested.    2. intermittent abdominal pain  --His dysphagia, odynophagia has resolved after chemotherapy and radiation. -He still has mild intermittent epigastric pain, possible related to his cancer or previous radiation. -Continue monitoring.  3. History of heavy smoking -He has quit smoking completely, history  of 40 pack year.   4. Constipation -I previously encouraged the  use of MiraLax daily or more if needed  - Will continue to monitor his BM -While on Percocet this may effect him BM, so I suggest he take ibuprofen or tylenol if that helps and Percocet as needed.    Plan: -Patient willreconsider surgery. I have sent a note to Dr. [name] about possible surgery -Port flush in 6 weeks  -Lab, flush and f/u in 3 months, sooner if he will have surgery. He will call me after his surgery.  -I recommend him to have a repeated CT scan before his surgery.     All questions were answered. The patient knows to call the clinic with any problems, questions or concerns.  I spent 20 minutes counseling the patient face to face. The total time spent in the appointment was 25 minutes and more than 50% was on counseling.  This document serves as a record of services personally performed by [name], MD. It was created on her behalf by Joslyn Devon, a trained medical scribe. The creation of this record is based on the scribe's personal observations and the provider's statements to them. This document has been checked and approved by the attending provider. .  I have reviewed the above documentation for accuracy and completeness and I agree with the above.   [name], MD 03/14/2017

## 2017-03-14 ENCOUNTER — Ambulatory Visit (HOSPITAL_BASED_OUTPATIENT_CLINIC_OR_DEPARTMENT_OTHER): Payer: Medicaid Other

## 2017-03-14 ENCOUNTER — Telehealth: Payer: Self-pay | Admitting: Hematology

## 2017-03-14 ENCOUNTER — Encounter: Payer: Self-pay | Admitting: Hematology

## 2017-03-14 ENCOUNTER — Ambulatory Visit (HOSPITAL_BASED_OUTPATIENT_CLINIC_OR_DEPARTMENT_OTHER): Payer: Medicaid Other | Admitting: Hematology

## 2017-03-14 ENCOUNTER — Other Ambulatory Visit (HOSPITAL_BASED_OUTPATIENT_CLINIC_OR_DEPARTMENT_OTHER): Payer: Medicaid Other

## 2017-03-14 VITALS — BP 115/68 | HR 85 | Temp 97.8°F | Resp 17 | Ht 61.0 in | Wt 159.8 lb

## 2017-03-14 DIAGNOSIS — C155 Malignant neoplasm of lower third of esophagus: Secondary | ICD-10-CM | POA: Diagnosis present

## 2017-03-14 DIAGNOSIS — K5903 Drug induced constipation: Secondary | ICD-10-CM

## 2017-03-14 DIAGNOSIS — R109 Unspecified abdominal pain: Secondary | ICD-10-CM | POA: Diagnosis not present

## 2017-03-14 DIAGNOSIS — Z95828 Presence of other vascular implants and grafts: Secondary | ICD-10-CM

## 2017-03-14 LAB — CEA (IN HOUSE-CHCC): CEA (CHCC-In House): 1.14 ng/mL (ref 0.00–5.00)

## 2017-03-14 LAB — CBC WITH DIFFERENTIAL/PLATELET
BASO%: 0.2 % (ref 0.0–2.0)
Basophils Absolute: 0 10*3/uL (ref 0.0–0.1)
EOS%: 5.3 % (ref 0.0–7.0)
Eosinophils Absolute: 0.2 10*3/uL (ref 0.0–0.5)
HEMATOCRIT: 35.5 % — AB (ref 38.4–49.9)
HGB: 11.8 g/dL — ABNORMAL LOW (ref 13.0–17.1)
LYMPH#: 0.6 10*3/uL — AB (ref 0.9–3.3)
LYMPH%: 13.9 % — ABNORMAL LOW (ref 14.0–49.0)
MCH: 31.3 pg (ref 27.2–33.4)
MCHC: 33.2 g/dL (ref 32.0–36.0)
MCV: 94.2 fL (ref 79.3–98.0)
MONO#: 0.4 10*3/uL (ref 0.1–0.9)
MONO%: 9.3 % (ref 0.0–14.0)
NEUT#: 3.2 10*3/uL (ref 1.5–6.5)
NEUT%: 71.3 % (ref 39.0–75.0)
Platelets: 159 10*3/uL (ref 140–400)
RBC: 3.77 10*6/uL — ABNORMAL LOW (ref 4.20–5.82)
RDW: 13.9 % (ref 11.0–14.6)
WBC: 4.5 10*3/uL (ref 4.0–10.3)

## 2017-03-14 LAB — COMPREHENSIVE METABOLIC PANEL
ALT: 36 U/L (ref 0–55)
AST: 25 U/L (ref 5–34)
Albumin: 3 g/dL — ABNORMAL LOW (ref 3.5–5.0)
Alkaline Phosphatase: 111 U/L (ref 40–150)
Anion Gap: 8 mEq/L (ref 3–11)
BUN: 16 mg/dL (ref 7.0–26.0)
CALCIUM: 9.2 mg/dL (ref 8.4–10.4)
CHLORIDE: 108 meq/L (ref 98–109)
CO2: 24 mEq/L (ref 22–29)
CREATININE: 0.7 mg/dL (ref 0.7–1.3)
EGFR: >90 mL/min/{1.73_m2} (ref >90)
GLUCOSE: 112 mg/dL (ref 70–140)
Potassium: 4.2 mEq/L (ref 3.5–5.1)
Sodium: 140 mEq/L (ref 136–145)
Total Bilirubin: 0.3 mg/dL (ref 0.20–1.20)
Total Protein: 6.5 g/dL (ref 6.4–8.3)

## 2017-03-14 MED ORDER — SODIUM CHLORIDE 0.9% FLUSH
10.0000 mL | Freq: Once | INTRAVENOUS | Status: AC
  Administered 2017-03-14: 10 mL
  Filled 2017-03-14: qty 10

## 2017-03-14 MED ORDER — HEPARIN SOD (PORK) LOCK FLUSH 100 UNIT/ML IV SOLN
500.0000 [IU] | Freq: Once | INTRAVENOUS | Status: AC
  Administered 2017-03-14: 500 [IU]
  Filled 2017-03-14: qty 5

## 2017-03-14 NOTE — Telephone Encounter (Signed)
Scheduled appt per 8/15 los - Gave patient AVS and calender per los.  

## 2017-03-15 ENCOUNTER — Ambulatory Visit: Payer: Medicaid Other | Admitting: Hematology

## 2017-03-15 ENCOUNTER — Other Ambulatory Visit: Payer: Medicaid Other

## 2017-04-05 ENCOUNTER — Other Ambulatory Visit: Payer: Self-pay | Admitting: *Deleted

## 2017-04-05 ENCOUNTER — Ambulatory Visit (INDEPENDENT_AMBULATORY_CARE_PROVIDER_SITE_OTHER): Payer: Medicaid Other | Admitting: Cardiothoracic Surgery

## 2017-04-05 ENCOUNTER — Encounter: Payer: Self-pay | Admitting: Cardiothoracic Surgery

## 2017-04-05 VITALS — BP 123/78 | HR 84 | Resp 20 | Ht 61.0 in | Wt 157.0 lb

## 2017-04-05 DIAGNOSIS — Z923 Personal history of irradiation: Secondary | ICD-10-CM | POA: Diagnosis not present

## 2017-04-05 DIAGNOSIS — Z9221 Personal history of antineoplastic chemotherapy: Secondary | ICD-10-CM

## 2017-04-05 DIAGNOSIS — C159 Malignant neoplasm of esophagus, unspecified: Secondary | ICD-10-CM | POA: Diagnosis not present

## 2017-04-05 DIAGNOSIS — Z01818 Encounter for other preprocedural examination: Secondary | ICD-10-CM

## 2017-04-05 DIAGNOSIS — C155 Malignant neoplasm of lower third of esophagus: Secondary | ICD-10-CM

## 2017-04-05 NOTE — Progress Notes (Signed)
[address]       [place] [address]             [phone]                    Kahle G Runions [place] Medical Record #[id number] Date of Birth: [birth year]  Referring: [name], Cunningham Primary Care: [name], Cunningham  Chief Complaint:    Chief Complaint  Patient presents with  . Esophageal Cancer    3 month f/u, further discuss surgery    History of Present Illness:    Darrell Cunningham 56 y.o. male is seen in the office  today , Surgical resection had been recommended to him in the past on his last visit he declined to proceed with surgery . He now returns to further discuss resection for esophageal cancer.     His symptoms began with increasing difficulty with pain with swallowing and with regurgitation. Because of these symptoms he underwent upper GI endoscopy by Dr. [name].  fungating mass extending up proximally 7 cm in the distal esophagus. CT scan suggests esophageal lymph nodes and a 2.3 cm mass. Biopsy were taken suggestive of adenocarcinoma but not diagnostic. Repeat biopsy was done 3/29 [id number] Diagnosis Esophagogastric junction, biopsy, mass - INVASIVE ADENOCARCINOMA. Microscopic Comment Dr. Lyndon Code has seen this case in consultation with agreement. The findings are called to Dr. Ardis Hughs on 10/27/2016. (RH:ecj 10/27/2016) Darrell Cunningham   Patient's past history is significant for mild cerebral palsy involving the right arm and leg and curvature of the spine. He is currently a nonsmoker having quit 4 years ago prior to that he smoked 1-2 packs per day for 38 years. He denies any alcohol intake for the last 20 years.  Patient completed chemotherapy on May 8 and radiation on May 10  Wt Readings from Last 3 Encounters:  04/05/17 157 lb (71.2 kg)  03/14/17 159 lb 12.8 oz (72.5 kg)  01/18/17 146 lb (66.2 kg)   Current Activity/ Functional Status:  Patient is independent with mobility/ambulation, transfers, ADL's,  IADL's.   Zubrod Score: At the time of surgery this patient's most appropriate activity status/level should be described as: []     0    Normal activity, no symptoms [x]     1    Restricted in physical strenuous activity but ambulatory, able to do out light work []     2    Ambulatory and capable of self care, unable to do work activities, up and about               >50 % of waking hours                              []     3    Only limited self care, in bed greater than 50% of waking hours []     4    Completely disabled, no self care, confined to bed or chair []     5    Moribund   Past Medical History:  Diagnosis Date  . Allergy   . Arthritis   . Asthma    as a child  . Cancer ([facility])   . CP (cerebral palsy), spastic ([facility])    right  . Dysphagia   . Encounter for nonprocreative genetic counseling 10/31/2016   [name] underwent genetic counseling for hereditary cancer syndromes on 10/31/2016. Though he  is a candidate for genetic testing, he declines at this time.  . Hyperlipidemia   . Neuromuscular disorder ([facility])   . Pneumonia 4 yrs ago    Past Surgical History:  Procedure Laterality Date  . EUS N/A 10/26/2016   Procedure: UPPER ENDOSCOPIC ULTRASOUND (EUS) LINEAR;  Surgeon: [name], Cunningham;  Location: [facility];  Service: Endoscopy;  Laterality: N/A;  . EYE SURGERY Bilateral age 34    for cross eyes  . IR FLUORO GUIDE PORT INSERTION RIGHT  11/08/2016  . IR US GUIDE VASC ACCESS RIGHT  11/08/2016  . MOUTH SURGERY      Family History  Problem Relation Age of Onset  . Colon cancer Maternal Grandmother 83  . Breast cancer Maternal Grandmother 29  . Breast cancer Sister 53       Deceased at 80 of breast cancer  . Ovarian cancer Maternal Aunt   . Stomach cancer Neg Hx   . Esophageal cancer Neg Hx     Social History   Social History  . Marital status: Legally Separated    Spouse name: N/A  . Number of children: N/A  . Years of education: N/A   Occupational  History  . Not on file.   Social History Main Topics  . Smoking status: Former Smoker    Packs/day: 1.00    Years: 38.00    Types: Cigarettes    Start date: 10/31/2012    Quit date: 10/31/2012  . Smokeless tobacco: Never Used  . Alcohol use No  . Drug use: No  . Sexual activity: Not on file   Other Topics Concern  . Not on file   Social History Narrative  . No narrative on file    History  Smoking Status  . Former Smoker  . Packs/day: 1.00  . Years: 38.00  . Types: Cigarettes  . Start date: 10/31/2012  . Quit date: 10/31/2012  Smokeless Tobacco  . Never Used    History  Alcohol Use No     Allergies  Allergen Reactions  . Penicillins     Has patient had a PCN reaction causing immediate rash, facial/tongue/throat swelling, SOB or lightheadedness with hypotension: No Has patient had a PCN reaction causing severe rash involving mucus membranes or skin necrosis: No Has patient had a PCN reaction that required hospitalization No Has patient had a PCN reaction occurring within the last 10 years: No If all of the above answers are "NO", then may proceed with Cephalosporin use. Unknown Childhood reaction    Current Outpatient Prescriptions  Medication Sig Dispense Refill  . acetaminophen (TYLENOL) 325 MG tablet Take 2 tablets (650 mg total) by mouth every 6 (six) hours as needed for mild pain (or Fever >/= 101). 30 tablet 0  . fluticasone (FLONASE) 50 MCG/ACT nasal spray Place 1 spray into both nostrils daily as needed for allergies or rhinitis.    Marland Kitchen ipratropium-albuterol (DUONEB) 0.5-2.5 (3) MG/3ML SOLN Take 3 mLs by nebulization every 6 (six) hours as needed. 360 mL 0  . lidocaine-prilocaine (EMLA) cream Apply 1 application topically as needed. Apply to portacath  1 1/2 hours - 2 hours prior to procedures as needed. 30 g 1  . ondansetron (ZOFRAN) 8 MG tablet Take 1 tablet (8 mg total) by mouth 2 (two) times daily as needed for refractory nausea / vomiting. Start on day 3 after  chemo. 30 tablet 1  . oxyCODONE-acetaminophen (PERCOCET/ROXICET) 5-325 MG tablet Take 1 tablet by mouth every 4 (four) hours as  needed for severe pain. 15 tablet 0  . pantoprazole (PROTONIX) 40 MG tablet TAKE 1 TABLET BY MOUTH TWICE A DAY 60 tablet 11  . PROAIR HFA 108 (90 Base) MCG/ACT inhaler INHALE 2 PUFFS INTO THE LUNGS EVERY 6 (SIX) HOURS AS NEEDED FOR WHEEZING OR SHORTNESS OF BREATH. 18 g 0  . prochlorperazine (COMPAZINE) 10 MG tablet Take 1 tablet (10 mg total) by mouth every 6 (six) hours as needed (Nausea or vomiting). 30 tablet 1  . sucralfate (CARAFATE) 1 GM/10ML suspension Take 10 mLs (1 g total) by mouth 4 (four) times daily -  with meals and at bedtime. 420 mL 0  . tiZANidine (ZANAFLEX) 4 MG tablet TAKE 1 TABLET (4 MG TOTAL) BY MOUTH EVERY 6 (SIX) HOURS AS NEEDED FOR MUSCLE SPASMS. 30 tablet 0   No current facility-administered medications for this visit.       Review of Systems:     Cardiac Review of Systems: Y or N  Chest Pain [ n   ]  Resting SOB [ n] Exertional SOB  [n  ]  Orthopnea Florencio.Farrier ]   Pedal Edema Florencio.Farrier   ]    Palpitations Florencio.Farrier  ] Syncope  Florencio.Farrier  ]   Presyncope [ n  ]  General Review of Systems: [Y] = yes [  ]=no Constitional: recent weight change [  ];  Wt loss over the last 3 months [   ] anorexia [  ]; fatigue [  ]; nausea [  ]; night sweats [  ]; fever [  ]; or chills [  ];          Dental: poor dentition[  ]; Last Dentist visit:   Eye : blurred vision [  ]; diplopia [   ]; vision changes [  ];  Amaurosis fugax[  ]; Resp: cough [  ];  wheezing[  ];  hemoptysis[  ]; shortness of breath[  ]; paroxysmal nocturnal dyspnea[  ]; dyspnea on exertion[  ]; or orthopnea[  ];  GI:  gallstones[  ], vomiting[  ];  dysphagia[  ]; melena[  ];  hematochezia [  ]; heartburn[  ];   Hx of  Colonoscopy[  ]; GU: kidney stones [  ]; hematuria[  ];   dysuria [  ];  nocturia[  ];  history of     obstruction [  ]; urinary frequency [  ]             Skin: rash, swelling[  ];, hair loss[  ];   peripheral edema[  ];  or itching[  ]; Musculosketetal: myalgias[  ];  joint swelling[  ];  joint erythema[  ];  joint pain[  ];  back pain[  ];  Heme/Lymph: bruising[  ];  bleeding[  ];  anemia[  ];  Neuro: TIA[  ];  headaches[  ];  stroke[  ];  vertigo[  ];  seizures[  ];   paresthesias[  ];  difficulty walking[  ];  Psych:depression[  ]; anxiety[  ];  Endocrine: diabetes[  ];  thyroid dysfunction[  ];  Immunizations: Flu up to date [  ]; Pneumococcal up to date [  ];  Other:  Physical Exam: BP 123/78   Pulse 84   Resp 20   Ht 5\' 1"  (1.549 m)   Wt 157 lb (71.2 kg)   SpO2 97% Comment: RA  BMI 29.66 kg/m   PHYSICAL EXAMINATION: Physical Exam  Constitutional: He is oriented to person, place, and  time. He appears well-developed and well-nourished. No distress.  HENT:  Mouth/Throat: No oropharyngeal exudate.  Eyes: Right eye exhibits no discharge. Left eye exhibits no discharge. No scleral icterus.  Neck: No JVD present. No tracheal deviation present. No thyromegaly present.  Cardiovascular: Normal rate and regular rhythm.  Exam reveals no gallop and no friction rub.   No murmur heard. Respiratory: No stridor. No respiratory distress. He has no wheezes. He has no rales. He exhibits no tenderness.  GI: He exhibits no distension and no mass. There is no tenderness. There is no rebound and no guarding.  Musculoskeletal: He exhibits no edema, tenderness or deformity.  Lymphadenopathy:    He has no cervical adenopathy.  Neurological: He is alert and oriented to person, place, and time. No cranial nerve deficit. Coordination normal.  Psychiatric: He has a normal mood and affect. His behavior is normal. Judgment and thought content normal.      Diagnostic Studies & Laboratory data:   Diagnosis Esophagus, biopsy, distal esophageal tumor (33-39) - SUSPICIOUS FOR ADENOCARCINOMA, SEE COMMENT. Microscopic Comment The majority of fragments are reactive squamous mucosa with associated  acute inflammation. There is a small fragment of atypical glandular epithelium with associated ulceration that is suspicious for adenocarcinoma. There is no background Barrett's esophagus in the glandular component. Dr. Saralyn Pilar has reviewed the case. The case was called to Dr. [name] on 10/10/2016. [name] Cunningham Pathologist, Electronic  - A large, fungating mass with no bleeding until contact was found in the lower third of the esophagus, 33 cm from the incisors and extending to the GE junction at 39 cm. The mass was partially obstructing and partially circumferential (involving one-half of the lumen circumference). Multiple biopsies were obtained with cold forceps for histology in a targeted manner. Findings: - The perviously described mass was also seen on retroflexion in the stomach at the gastroesophageal junction. - The exam of the stomach was otherwise normal. - The examined duodenum was normal.      EUS  Endoscopic Finding Findings: A medium-sized, fungating, very friable mass was found at the gastroesophageal junction. The 6cm long mass was partially obstructing and partially circumferential (involving two thirds of the lumen circumference). The bulk of the mass lays in the distal esophagus but it does stradle the GE junction with about 2cm of tumor below the Z line. Biopsies were taken with a cold forceps for histology. Endosonographic Finding 1. The mass above correlated with a hypoechoic and heterogenous lesion at the gastroesophageal junction. This ranges from 33 cm from the incisors and extended to 39 cm. The lesion was partially circumferential (involving two-thirds of the lumen circumference). The endosonographic borders were poorly-defined. The mass measured up to 16 mm in thickness. There was sonographic evidence suggesting invasion into the adventitia (Layer 5, UT3). 2. There was one 1.3cm round, hypoechoic, heterogeneous lymphnode adjacent to the mass. This  is presumed to be malignant based on EUS criteria (uN1). 3. Limited views of pancreas, spleen, liver were all normal.    Recent Radiology Findings:   Dg Chest 2 View  Result Date: 01/01/2017 CLINICAL DATA:  Congestion, cough and dyspnea x3 days. Esophageal carcinoma. Post chemotherapy and radiation. EXAM: CHEST  2 VIEW COMPARISON:  10/12/2016 CT chest. FINDINGS: The heart size and mediastinal contours are within normal limits. Right-sided port catheter tip in the cavoatrial junction. Both lungs are clear. No dilatation of the esophagus in this patient with known distal esophageal mass like abnormality on CT. The visualized skeletal structures are unremarkable.  IMPRESSION: No active cardiopulmonary disease. Electronically Signed   By: Ashley Royalty M.D.   On: 01/01/2017 21:38   Result Date: 01/18/2017 CLINICAL DATA:  Restaging esophageal cancer. EXAM: CT CHEST, ABDOMEN, AND PELVIS WITH CONTRAST TECHNIQUE: Multidetector CT imaging of the chest, abdomen and pelvis was performed following the standard protocol during bolus administration of intravenous contrast. CONTRAST:  173mL ISOVUE-300 IOPAMIDOL (ISOVUE-300) INJECTION 61% COMPARISON:  12/14/2016 abdominal/ pelvic CT scan and PET-CT 10/24/2016 FINDINGS: CT CHEST FINDINGS Cardiovascular: The heart is normal in size. No pericardial effusion. Mild stable tortuosity and calcification of the thoracic aorta and branch vessels. No focal aneurysm or dissection. Stable age advanced three-vessel coronary artery calcifications. Right-sided Port-A-Cath in good position without complicating features. Mediastinum/Nodes: No mediastinal or hilar mass or lymphadenopathy. Mild wall stable distal esophageal wall thickening likely due to radiation. No obvious recurrent esophageal mass. Small paraesophageal lymph nodes. The largest measures 3.5 mm on image number 20. This is stable when compared to the prior PET-CT. Lungs/Pleura: Stable mild/early emphysematous changes. No acute  pulmonary findings. No worrisome pulmonary nodules to suggest pulmonary metastatic disease. No pleural effusion or pleural nodules. Chest wall/ Musculoskeletal: Right-sided Port-A-Cath is stable. No breast masses, supraclavicular or axillary lymphadenopathy. The thyroid gland appears normal. No significant bony findings. No evidence of osseous metastatic disease. CT ABDOMEN PELVIS FINDINGS Hepatobiliary: No focal hepatic lesions to suggest metastatic disease. The gallbladder is normal. No common bile duct dilatation. Pancreas: No mass, inflammation or ductal dilatation. Spleen: Normal size.  No focal lesions. Adrenals/Urinary Tract: The adrenal glands and kidneys are unremarkable and stable. No renal, ureteral or bladder calculi or mass. Stomach/Bowel: The stomach, duodenum, small bowel and colon are unremarkable. No inflammatory changes, mass lesions or obstructive findings. Vascular/Lymphatic: Stable age advanced atherosclerotic calcifications involving the aorta and iliac arteries. No focal aneurysm or dissection. The branch vessels are patent. The major venous structures are patent. No mesenteric or retroperitoneal mass or lymphadenopathy. Small scattered lymph nodes are stable. Reproductive: The prostate gland and seminal vesicles are unremarkable. Other: No pelvic mass or adenopathy. No free pelvic fluid collections. No inguinal mass or adenopathy. No abdominal wall hernia or subcutaneous lesions. Musculoskeletal: No significant bony findings. IMPRESSION: 1. Mild stable distal esophageal wall thickening likely due to radiation change. No findings for recurrent tumor. Small paraesophageal lymph nodes are also stable. 2. No findings for metastatic disease. 3. Stable mild/early emphysematous changes and age advanced atherosclerotic calcifications involving the thoracic and abdominal aorta and branch vessels. Electronically Signed   By: Marijo Sanes M.D.   On: 01/18/2017 12:42   I have independently reviewed  the above radiology studies  and reviewed the findings with the patient and his parents.   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 Previously risks and options of surgery had been discussed with him in detail, these were again covered. We'll plan on repeat CT of the chest abdomen pelvis for restaging since the last one done was almost 3 months ago. We'll tentatively plan to proceed with bronchoscopy transhiatal esophagectomy with cervical esophagogastrostomy and feeding jejunostomy on September 17   I had a detailed discussion with [name]  and his parents regarding the magnitude of the surgical esophagectomy procedure as well as the risks, the expected benefits, and alternatives.  I quoted Duan G Camberos 5% perioperative mortality rate and a complication rate as high as 40%.  We specifically discussed complications, which include, but were not limited to: recurrent nerve injury with possible permanent hoarseness, anastomotic leak, airway and great vessel injury, conduit ischemia, thoracic duct leak, the inability to complete the operation via a transhiatal approach requiring a right thoracotomy,  bleeding, need for blood transfusion and the potential need for ventilator support.  Briar IOAN LANDINI has had questions  answered is well informed and willing to proceed.    [name] Cunningham      [address] [place] [address] Office [phone]   Beeper [phone]  04/05/2017 2:35 PM

## 2017-04-05 NOTE — Progress Notes (Signed)
Preparation for your surgery: Bowel Prep  You will need to undergo a bowel prep starting 2 days prior to your surgery.  You will start clear liquids mid-day 9/15 and all day 9/16.  Nothing to eat to drink after 9/16 at midnight.  Clear liquid diet:  This includes broths (vegetable, chicken or beef), juices (grape, apple, or cranberry), Jell-O, popsicles, coffee or tea (without milk or cream) and Gatorade. Hard candy and gum are OK. Avoid alcohol and all solid food. You should take in at LEAST 10 cups of fluid this day. In addition to the clear liquid diet (that you will remain on the entire day).  You will also need to drink ONE 10 ounce bottle of Magnesium Citrate on 9/16. It is best if you can drink it mid-morning, around 10 am (you can buy this over the counter at your neighborhood pharmacy).

## 2017-04-06 ENCOUNTER — Telehealth: Payer: Self-pay

## 2017-04-06 ENCOUNTER — Other Ambulatory Visit: Payer: Self-pay

## 2017-04-06 ENCOUNTER — Other Ambulatory Visit: Payer: Self-pay | Admitting: *Deleted

## 2017-04-06 ENCOUNTER — Other Ambulatory Visit: Payer: Self-pay | Admitting: Cardiothoracic Surgery

## 2017-04-06 DIAGNOSIS — C159 Malignant neoplasm of esophagus, unspecified: Secondary | ICD-10-CM

## 2017-04-06 NOTE — Telephone Encounter (Signed)
MR Pettet notifed of surgery date, pre op testing date and times

## 2017-04-10 ENCOUNTER — Other Ambulatory Visit: Payer: Self-pay | Admitting: *Deleted

## 2017-04-11 NOTE — Pre-Procedure Instructions (Signed)
MILAM ALLBAUGH  04/11/2017      [id number] [place], [place] - [address] [place] [address] Phone: [phone] Fax: [phone]    Your procedure is scheduled on September 17  Report to [facility] at [email or web address].  Call this number if you have problems the morning of surgery:  [phone]   Remember:  Do not eat food or drink liquids after midnight.  Continue all other medications as directed by your physician except follow these medication instructions before surgery   Take these medicines the morning of surgery with A SIP OF WATER  acetaminophen (TYLENOL) ipratropium-albuterol (DUONEB fluticasone (FLONASE)  oxyCODONE-acetaminophen (PERCOCET/ROXICET) pantoprazole (PROTONIX) PROAIR HFA  prochlorperazine (COMPAZINE)  tiZANidine (ZANAFLEX)  7 days prior to surgery STOP taking any Aspirin, Aleve, Naproxen, Ibuprofen, Motrin, Advil, Goody's, BC's, all herbal medications, fish oil, and all vitamins    Do not wear jewelry  Do not wear lotions, powders, or cologne, or deoderant.  Men may shave face and neck.  Do not bring valuables to the hospital.  [facility] is not responsible for any belongings or valuables.  Contacts, dentures or bridgework may not be worn into surgery.  Leave your suitcase in the car.  After surgery it may be brought to your room.  For patients admitted to the hospital, discharge time will be determined by your treatment team.  Patients discharged the day of surgery will not be allowed to drive home.    Special instructions:   [facility]- Preparing For Surgery  Before surgery, you can play an important role. Because skin is not sterile, your skin needs to be as free of germs as possible. You can reduce the number of germs on your skin by washing with CHG (chlorahexidine gluconate) Soap before surgery.  CHG is an antiseptic cleaner which kills germs and  bonds with the skin to continue killing germs even after washing.  Please do not use if you have an allergy to CHG or antibacterial soaps. If your skin becomes reddened/irritated stop using the CHG.  Do not shave (including legs and underarms) for at least 48 hours prior to first CHG shower. It is OK to shave your face.  Please follow these instructions carefully.   1. Shower the NIGHT BEFORE SURGERY and the MORNING OF SURGERY with CHG.   2. If you chose to wash your hair, wash your hair first as usual with your normal shampoo.  3. After you shampoo, rinse your hair and body thoroughly to remove the shampoo.  4. Use CHG as you would any other liquid soap. You can apply CHG directly to the skin and wash gently with a scrungie or a clean washcloth.   5. Apply the CHG Soap to your body ONLY FROM THE NECK DOWN.  Do not use on open wounds or open sores. Avoid contact with your eyes, ears, mouth and genitals (private parts). Wash genitals (private parts) with your normal soap.  6. Wash thoroughly, paying special attention to the area where your surgery will be performed.  7. Thoroughly rinse your body with warm water from the neck down.  8. DO NOT shower/wash with your normal soap after using and rinsing off the CHG Soap.  9. Pat yourself dry with a CLEAN TOWEL.   10. Wear CLEAN PAJAMAS   11. Place CLEAN SHEETS on your bed the night of your first shower and DO NOT SLEEP WITH  PETS.    Day of Surgery: Do not apply any deodorants/lotions. Please wear clean clothes to the hospital/surgery center.      Please read over the following fact sheets that you were given.

## 2017-04-12 ENCOUNTER — Ambulatory Visit
Admission: RE | Admit: 2017-04-12 | Discharge: 2017-04-12 | Disposition: A | Discharge status: Home / Self Care | Payer: Medicaid Other | Source: Ambulatory Visit | Attending: Cardiothoracic Surgery | Admitting: Cardiothoracic Surgery

## 2017-04-12 ENCOUNTER — Other Ambulatory Visit: Payer: Self-pay

## 2017-04-12 ENCOUNTER — Encounter (HOSPITAL_COMMUNITY): Payer: Self-pay

## 2017-04-12 ENCOUNTER — Encounter (HOSPITAL_COMMUNITY)
Admission: RE | Admit: 2017-04-12 | Discharge: 2017-04-12 | Disposition: A | Discharge status: Home / Self Care | Payer: Medicaid Other | Source: Ambulatory Visit | Attending: Cardiothoracic Surgery | Admitting: Cardiothoracic Surgery

## 2017-04-12 DIAGNOSIS — Z01818 Encounter for other preprocedural examination: Secondary | ICD-10-CM

## 2017-04-12 DIAGNOSIS — C155 Malignant neoplasm of lower third of esophagus: Secondary | ICD-10-CM

## 2017-04-12 DIAGNOSIS — C159 Malignant neoplasm of esophagus, unspecified: Secondary | ICD-10-CM | POA: Insufficient documentation

## 2017-04-12 DIAGNOSIS — Z01812 Encounter for preprocedural laboratory examination: Secondary | ICD-10-CM | POA: Insufficient documentation

## 2017-04-12 DIAGNOSIS — G809 Cerebral palsy, unspecified: Secondary | ICD-10-CM | POA: Insufficient documentation

## 2017-04-12 HISTORY — DX: Gastro-esophageal reflux disease without esophagitis: K21.9

## 2017-04-12 LAB — BLOOD GAS, ARTERIAL
Acid-base deficit: 2.3 mmol/L — ABNORMAL HIGH (ref 0.0–2.0)
Bicarbonate: 21.6 mmol/L (ref 20.0–28.0)
Drawn by: 470591
FIO2: 21
O2 Saturation: 97.4 %
Patient temperature: 98.6
pCO2 arterial: 34.6 mmHg (ref 32.0–48.0)
pH, Arterial: 7.412 (ref 7.350–7.450)
pO2, Arterial: 91.9 mmHg (ref 83.0–108.0)

## 2017-04-12 LAB — TYPE AND SCREEN
ABO/RH(D): O NEG
Antibody Screen: NEGATIVE

## 2017-04-12 LAB — COMPREHENSIVE METABOLIC PANEL
ALT: 37 U/L (ref 17–63)
AST: 36 U/L (ref 15–41)
Albumin: 3.4 g/dL — ABNORMAL LOW (ref 3.5–5.0)
Alkaline Phosphatase: 106 U/L (ref 38–126)
Anion gap: 9 (ref 5–15)
BUN: 13 mg/dL (ref 6–20)
CO2: 18 mmol/L — ABNORMAL LOW (ref 22–32)
Calcium: 8.7 mg/dL — ABNORMAL LOW (ref 8.9–10.3)
Chloride: 107 mmol/L (ref 101–111)
Creatinine, Ser: 0.71 mg/dL (ref 0.61–1.24)
GFR calc Af Amer: >60 mL/min (ref >60)
GFR calc non Af Amer: >60 mL/min (ref >60)
Glucose, Bld: 159 mg/dL — ABNORMAL HIGH (ref 65–99)
Potassium: 3.8 mmol/L (ref 3.5–5.1)
Sodium: 134 mmol/L — ABNORMAL LOW (ref 135–145)
Total Bilirubin: 0.4 mg/dL (ref 0.3–1.2)
Total Protein: 6.6 g/dL (ref 6.5–8.1)

## 2017-04-12 LAB — URINALYSIS, ROUTINE W REFLEX MICROSCOPIC
Bacteria, UA: NONE SEEN
Bilirubin Urine: NEGATIVE
Glucose, UA: NEGATIVE mg/dL
Ketones, ur: NEGATIVE mg/dL
Leukocytes, UA: NEGATIVE
Nitrite: NEGATIVE
Protein, ur: NEGATIVE mg/dL
Specific Gravity, Urine: >1.046 — ABNORMAL HIGH (ref 1.005–1.030)
pH: 5 (ref 5.0–8.0)

## 2017-04-12 LAB — CBC

## 2017-04-12 LAB — ABO/RH: ABO/RH(D): O NEG

## 2017-04-12 LAB — APTT: aPTT: 21 s — ABNORMAL LOW (ref 24–36)

## 2017-04-12 LAB — SURGICAL PCR SCREEN
MRSA, PCR: NEGATIVE
Staphylococcus aureus: NEGATIVE

## 2017-04-12 LAB — PROTIME-INR
INR: 0.91
Prothrombin Time: 12.1 s (ref 11.4–15.2)

## 2017-04-12 MED ORDER — IOPAMIDOL (ISOVUE-300) INJECTION 61%: 100.0000 mL | Freq: Once | INTRAVENOUS | Status: DC | PRN

## 2017-04-12 NOTE — Progress Notes (Addendum)
PCP is Dr. Alexis Goodell 07/2016 Denies any cardiac issues or tests Oncologist is Dr. Burr Medico  LOV 02/2017 Last doses of chemo was 02/2017, last radiation treatment 01/2017 PAC in upper part left chest. Right side deficits from the cerebral palsy, otherwise, pleasant knowledgeable man. Lab states lavender tube for CBC was clotted, will need another sample dos

## 2017-04-16 ENCOUNTER — Inpatient Hospital Stay (HOSPITAL_COMMUNITY): Payer: Medicaid Other | Admitting: Anesthesiology

## 2017-04-16 ENCOUNTER — Inpatient Hospital Stay (HOSPITAL_COMMUNITY): Payer: Medicaid Other

## 2017-04-16 ENCOUNTER — Encounter (HOSPITAL_COMMUNITY): Payer: Self-pay | Admitting: *Deleted

## 2017-04-16 ENCOUNTER — Encounter (HOSPITAL_COMMUNITY)
Admission: RE | Disposition: A | Discharge status: Home Health | Payer: Self-pay | Source: Home / Self Care | Attending: Cardiothoracic Surgery

## 2017-04-16 ENCOUNTER — Inpatient Hospital Stay (HOSPITAL_COMMUNITY)
Admission: RE | Admit: 2017-04-16 | Discharge: 2017-04-29 | DRG: 327 | Disposition: A | Discharge status: Home Health | Payer: Medicaid Other | Attending: Cardiothoracic Surgery | Admitting: Cardiothoracic Surgery

## 2017-04-16 DIAGNOSIS — Z09 Encounter for follow-up examination after completed treatment for conditions other than malignant neoplasm: Secondary | ICD-10-CM

## 2017-04-16 DIAGNOSIS — M1990 Unspecified osteoarthritis, unspecified site: Secondary | ICD-10-CM | POA: Diagnosis not present

## 2017-04-16 DIAGNOSIS — J90 Pleural effusion, not elsewhere classified: Secondary | ICD-10-CM | POA: Diagnosis not present

## 2017-04-16 DIAGNOSIS — Z800 Family history of malignant neoplasm of digestive organs: Secondary | ICD-10-CM

## 2017-04-16 DIAGNOSIS — Z8701 Personal history of pneumonia (recurrent): Secondary | ICD-10-CM

## 2017-04-16 DIAGNOSIS — C159 Malignant neoplasm of esophagus, unspecified: Secondary | ICD-10-CM | POA: Diagnosis present

## 2017-04-16 DIAGNOSIS — K319 Disease of stomach and duodenum, unspecified: Secondary | ICD-10-CM

## 2017-04-16 DIAGNOSIS — J439 Emphysema, unspecified: Secondary | ICD-10-CM | POA: Diagnosis not present

## 2017-04-16 DIAGNOSIS — C155 Malignant neoplasm of lower third of esophagus: Principal | ICD-10-CM | POA: Diagnosis present

## 2017-04-16 DIAGNOSIS — D72829 Elevated white blood cell count, unspecified: Secondary | ICD-10-CM | POA: Diagnosis not present

## 2017-04-16 DIAGNOSIS — Z87891 Personal history of nicotine dependence: Secondary | ICD-10-CM | POA: Diagnosis not present

## 2017-04-16 DIAGNOSIS — Z9889 Other specified postprocedural states: Secondary | ICD-10-CM

## 2017-04-16 DIAGNOSIS — D62 Acute posthemorrhagic anemia: Secondary | ICD-10-CM | POA: Diagnosis not present

## 2017-04-16 DIAGNOSIS — Z9689 Presence of other specified functional implants: Secondary | ICD-10-CM

## 2017-04-16 DIAGNOSIS — Z880 Allergy status to penicillin: Secondary | ICD-10-CM

## 2017-04-16 DIAGNOSIS — G809 Cerebral palsy, unspecified: Secondary | ICD-10-CM | POA: Diagnosis not present

## 2017-04-16 DIAGNOSIS — R0602 Shortness of breath: Secondary | ICD-10-CM | POA: Diagnosis not present

## 2017-04-16 DIAGNOSIS — R05 Cough: Secondary | ICD-10-CM | POA: Diagnosis not present

## 2017-04-16 DIAGNOSIS — Z4682 Encounter for fitting and adjustment of non-vascular catheter: Secondary | ICD-10-CM | POA: Diagnosis not present

## 2017-04-16 DIAGNOSIS — Z23 Encounter for immunization: Secondary | ICD-10-CM | POA: Diagnosis not present

## 2017-04-16 DIAGNOSIS — J9811 Atelectasis: Secondary | ICD-10-CM | POA: Diagnosis not present

## 2017-04-16 DIAGNOSIS — J45909 Unspecified asthma, uncomplicated: Secondary | ICD-10-CM | POA: Diagnosis not present

## 2017-04-16 HISTORY — PX: JEJUNOSTOMY: SHX313

## 2017-04-16 HISTORY — PX: VIDEO BRONCHOSCOPY: SHX5072

## 2017-04-16 HISTORY — PX: COMPLETE ESOPHAGECTOMY: SHX5286

## 2017-04-16 LAB — POCT I-STAT 7, (LYTES, BLD GAS, ICA,H+H)
Acid-base deficit: 1 mmol/L (ref 0.0–2.0)
Acid-base deficit: 1 mmol/L (ref 0.0–2.0)
Bicarbonate: 24.6 mmol/L (ref 20.0–28.0)
Bicarbonate: 23.9 mmol/L (ref 20.0–28.0)
Calcium, Ion: 1.07 mmol/L — ABNORMAL LOW (ref 1.15–1.40)
Calcium, Ion: 1.07 mmol/L — ABNORMAL LOW (ref 1.15–1.40)
HCT: 31 % — ABNORMAL LOW (ref 39.0–52.0)
HCT: 29 % — ABNORMAL LOW (ref 39.0–52.0)
Hemoglobin: 10.5 g/dL — ABNORMAL LOW (ref 13.0–17.0)
Hemoglobin: 9.9 g/dL — ABNORMAL LOW (ref 13.0–17.0)
O2 Saturation: 99 %
O2 Saturation: 99 %
Potassium: 4.3 mmol/L (ref 3.5–5.1)
Potassium: 4.3 mmol/L (ref 3.5–5.1)
Sodium: 138 mmol/L (ref 135–145)
Sodium: 137 mmol/L (ref 135–145)
TCO2: 26 mmol/L (ref 22–32)
TCO2: 25 mmol/L (ref 22–32)
pCO2 arterial: 45.1 mmHg (ref 32.0–48.0)
pCO2 arterial: 39.9 mmHg (ref 32.0–48.0)
pH, Arterial: 7.344 — ABNORMAL LOW (ref 7.350–7.450)
pH, Arterial: 7.385 (ref 7.350–7.450)
pO2, Arterial: 142 mmHg — ABNORMAL HIGH (ref 83.0–108.0)
pO2, Arterial: 159 mmHg — ABNORMAL HIGH (ref 83.0–108.0)

## 2017-04-16 LAB — BASIC METABOLIC PANEL
Anion gap: 8 (ref 5–15)
BUN: 8 mg/dL (ref 6–20)
CHLORIDE: 104 mmol/L (ref 101–111)
CO2: 22 mmol/L (ref 22–32)
CREATININE: 0.83 mg/dL (ref 0.61–1.24)
Calcium: 7.9 mg/dL — ABNORMAL LOW (ref 8.9–10.3)
GFR calc Af Amer: >60 mL/min (ref >60)
GFR calc non Af Amer: >60 mL/min (ref >60)
GLUCOSE: 179 mg/dL — AB (ref 65–99)
Potassium: 4.5 mmol/L (ref 3.5–5.1)
SODIUM: 134 mmol/L — AB (ref 135–145)

## 2017-04-16 LAB — POCT I-STAT 3, ART BLOOD GAS (G3+)
Acid-base deficit: 1 mmol/L (ref 0.0–2.0)
Bicarbonate: 24.8 mmol/L (ref 20.0–28.0)
O2 Saturation: 98 %
Patient temperature: 97.7
TCO2: 26 mmol/L (ref 22–32)
pCO2 arterial: 42.1 mmHg (ref 32.0–48.0)
pH, Arterial: 7.375 (ref 7.350–7.450)
pO2, Arterial: 111 mmHg — ABNORMAL HIGH (ref 83.0–108.0)

## 2017-04-16 LAB — CBC
HCT: 39.4 % (ref 39.0–52.0)
HCT: 30.9 % — ABNORMAL LOW (ref 39.0–52.0)
HEMOGLOBIN: 10.3 g/dL — AB (ref 13.0–17.0)
Hemoglobin: 13 g/dL (ref 13.0–17.0)
MCH: 29.9 pg (ref 26.0–34.0)
MCH: 30.2 pg (ref 26.0–34.0)
MCHC: 33 g/dL (ref 30.0–36.0)
MCHC: 33.3 g/dL (ref 30.0–36.0)
MCV: 90.6 fL (ref 78.0–100.0)
MCV: 90.6 fL (ref 78.0–100.0)
Platelets: 160 10*3/uL (ref 150–400)
Platelets: 145 10*3/uL — ABNORMAL LOW (ref 150–400)
RBC: 4.35 MIL/uL (ref 4.22–5.81)
RBC: 3.41 MIL/uL — ABNORMAL LOW (ref 4.22–5.81)
RDW: 13.4 % (ref 11.5–15.5)
RDW: 13.7 % (ref 11.5–15.5)
WBC: 7.3 10*3/uL (ref 4.0–10.5)
WBC: 8.8 10*3/uL (ref 4.0–10.5)

## 2017-04-16 LAB — POCT I-STAT 4, (NA,K, GLUC, HGB,HCT)
Glucose, Bld: 159 mg/dL — ABNORMAL HIGH (ref 65–99)
HCT: 29 % — ABNORMAL LOW (ref 39.0–52.0)
Hemoglobin: 9.9 g/dL — ABNORMAL LOW (ref 13.0–17.0)
Potassium: 3.6 mmol/L (ref 3.5–5.1)
Sodium: 138 mmol/L (ref 135–145)

## 2017-04-16 LAB — GLUCOSE, CAPILLARY
Glucose-Capillary: 202 mg/dL — ABNORMAL HIGH (ref 65–99)
Glucose-Capillary: 200 mg/dL — ABNORMAL HIGH (ref 65–99)

## 2017-04-16 SURGERY — BRONCHOSCOPY, VIDEO-ASSISTED
Anesthesia: General | Site: Abdomen

## 2017-04-16 MED ORDER — HYDROMORPHONE 1 MG/ML IV SOLN
INTRAVENOUS | Status: DC
  Administered 2017-04-17: 4 mg via INTRAVENOUS
  Administered 2017-04-17: 4.5 mg via INTRAVENOUS
  Administered 2017-04-17: 3 mg via INTRAVENOUS
  Administered 2017-04-17: 1 mg via INTRAVENOUS
  Administered 2017-04-18: 3.3 mg via INTRAVENOUS
  Administered 2017-04-18: 0.6 mg via INTRAVENOUS
  Administered 2017-04-18: 2.4 mg via INTRAVENOUS
  Administered 2017-04-18: 0.9 mg via INTRAVENOUS
  Administered 2017-04-18: 1.5 mg via INTRAVENOUS
  Administered 2017-04-18: 1 mg via INTRAVENOUS
  Administered 2017-04-18: 4.8 mg via INTRAVENOUS
  Administered 2017-04-19: 0.7 mL via INTRAVENOUS
  Administered 2017-04-19: 1.2 mL via INTRAVENOUS
  Administered 2017-04-19: 1.2 mg via INTRAVENOUS
  Administered 2017-04-19: 0.6 mg via INTRAVENOUS
  Administered 2017-04-19: 1.2 mg via INTRAVENOUS
  Administered 2017-04-20: 0.6 mg via INTRAVENOUS
  Administered 2017-04-20 (×2): 0.9 mg via INTRAVENOUS
  Administered 2017-04-21: 0.6 mg via INTRAVENOUS
  Administered 2017-04-21: 0.9 mg via INTRAVENOUS
  Administered 2017-04-21: 1.2 mg via INTRAVENOUS
  Administered 2017-04-21: 2.7 mg via INTRAVENOUS
  Administered 2017-04-21: 1.8 mg via INTRAVENOUS
  Administered 2017-04-22: 0.6 mg via INTRAVENOUS
  Administered 2017-04-22: 1.2 mg via INTRAVENOUS
  Administered 2017-04-22: 0.6 mg via INTRAVENOUS
  Administered 2017-04-22: 0.9 mg via INTRAVENOUS
  Administered 2017-04-22: 1.2 mg via INTRAVENOUS
  Administered 2017-04-22: 1.5 mg via INTRAVENOUS
  Administered 2017-04-23 (×2): 0.3 mg via INTRAVENOUS
  Administered 2017-04-23: 0.9 mg via INTRAVENOUS
  Administered 2017-04-23: 0.3 mg via INTRAVENOUS
  Administered 2017-04-23: 0.9 mg via INTRAVENOUS
  Administered 2017-04-24: 0.6 mg via INTRAVENOUS
  Administered 2017-04-24: 1.2 mL via INTRAVENOUS
  Administered 2017-04-24: 0.6 mL via INTRAVENOUS
  Administered 2017-04-24: 0.3 mL via INTRAVENOUS
  Administered 2017-04-24: 0.9 mg via INTRAVENOUS
  Administered 2017-04-25: 1.2 mg via INTRAVENOUS
  Filled 2017-04-16 (×3): qty 25

## 2017-04-16 MED ORDER — ACETAMINOPHEN 160 MG/5ML PO SOLN: 325.0000 mg | ORAL | Status: DC | PRN

## 2017-04-16 MED ORDER — LIDOCAINE 2% (20 MG/ML) 5 ML SYRINGE
INTRAMUSCULAR | Status: DC | PRN
  Administered 2017-04-16: 40 mg via INTRAVENOUS

## 2017-04-16 MED ORDER — PHENYLEPHRINE 40 MCG/ML (10ML) SYRINGE FOR IV PUSH (FOR BLOOD PRESSURE SUPPORT)
PREFILLED_SYRINGE | INTRAVENOUS | Status: AC
  Filled 2017-04-16: qty 10

## 2017-04-16 MED ORDER — PROPOFOL 10 MG/ML IV BOLUS
INTRAVENOUS | Status: DC | PRN
  Administered 2017-04-16: 150 mg via INTRAVENOUS

## 2017-04-16 MED ORDER — ROCURONIUM BROMIDE 10 MG/ML (PF) SYRINGE
PREFILLED_SYRINGE | INTRAVENOUS | Status: DC | PRN
  Administered 2017-04-16: 10 mg via INTRAVENOUS
  Administered 2017-04-16: 30 mg via INTRAVENOUS
  Administered 2017-04-16: 60 mg via INTRAVENOUS
  Administered 2017-04-16: 10 mg via INTRAVENOUS
  Administered 2017-04-16: 20 mg via INTRAVENOUS
  Administered 2017-04-16: 40 mg via INTRAVENOUS

## 2017-04-16 MED ORDER — KCL IN DEXTROSE-NACL 20-5-0.45 MEQ/L-%-% IV SOLN
INTRAVENOUS | Status: DC
  Administered 2017-04-16: 17:00:00 via INTRAVENOUS
  Filled 2017-04-16 (×3): qty 1000

## 2017-04-16 MED ORDER — LACTATED RINGERS IV SOLN
INTRAVENOUS | Status: DC | PRN
  Administered 2017-04-16: 07:00:00 via INTRAVENOUS

## 2017-04-16 MED ORDER — INSULIN ASPART 100 UNIT/ML ~~LOC~~ SOLN
0.0000 [IU] | SUBCUTANEOUS | Status: DC
  Administered 2017-04-16 (×3): 4 [IU] via SUBCUTANEOUS
  Administered 2017-04-17 – 2017-04-26 (×25): 2 [IU] via SUBCUTANEOUS
  Administered 2017-04-27 (×2): 4 [IU] via SUBCUTANEOUS
  Administered 2017-04-27 – 2017-04-28 (×4): 2 [IU] via SUBCUTANEOUS
  Administered 2017-04-29: 4 [IU] via SUBCUTANEOUS

## 2017-04-16 MED ORDER — PROPOFOL 10 MG/ML IV BOLUS
INTRAVENOUS | Status: AC
  Filled 2017-04-16: qty 40

## 2017-04-16 MED ORDER — LACTATED RINGERS IV SOLN
INTRAVENOUS | Status: DC | PRN
  Administered 2017-04-16: 08:00:00 via INTRAVENOUS

## 2017-04-16 MED ORDER — MIDAZOLAM HCL 2 MG/2ML IJ SOLN
1.0000 mg | INTRAMUSCULAR | Status: DC | PRN
  Administered 2017-04-16 (×2): 2 mg via INTRAVENOUS
  Filled 2017-04-16 (×2): qty 2

## 2017-04-16 MED ORDER — ROCURONIUM BROMIDE 10 MG/ML (PF) SYRINGE
PREFILLED_SYRINGE | INTRAVENOUS | Status: AC
  Filled 2017-04-16: qty 10

## 2017-04-16 MED ORDER — SUGAMMADEX SODIUM 200 MG/2ML IV SOLN
INTRAVENOUS | Status: AC
  Filled 2017-04-16: qty 2

## 2017-04-16 MED ORDER — CLINDAMYCIN PHOSPHATE 900 MG/6ML IV SOLN
900.0000 mg | INTRAVENOUS | Status: DC
  Filled 2017-04-16: qty 6

## 2017-04-16 MED ORDER — ALBUMIN HUMAN 5 % IV SOLN
INTRAVENOUS | Status: DC | PRN
  Administered 2017-04-16 (×5): via INTRAVENOUS

## 2017-04-16 MED ORDER — ACETAMINOPHEN 325 MG PO TABS: 325.0000 mg | ORAL_TABLET | ORAL | Status: DC | PRN

## 2017-04-16 MED ORDER — EPHEDRINE SULFATE-NACL 50-0.9 MG/10ML-% IV SOSY
PREFILLED_SYRINGE | INTRAVENOUS | Status: DC | PRN
  Administered 2017-04-16: 10 mg via INTRAVENOUS

## 2017-04-16 MED ORDER — PANTOPRAZOLE SODIUM 40 MG IV SOLR
40.0000 mg | Freq: Two times a day (BID) | INTRAVENOUS | Status: DC
  Administered 2017-04-16 – 2017-04-21 (×10): 40 mg via INTRAVENOUS
  Filled 2017-04-16 (×10): qty 40

## 2017-04-16 MED ORDER — 0.9 % SODIUM CHLORIDE (POUR BTL) OPTIME
TOPICAL | Status: DC | PRN
  Administered 2017-04-16: 2000 mL

## 2017-04-16 MED ORDER — CLINDAMYCIN PHOSPHATE 900 MG/50ML IV SOLN
900.0000 mg | Freq: Three times a day (TID) | INTRAVENOUS | Status: AC
  Administered 2017-04-16 – 2017-04-17 (×2): 900 mg via INTRAVENOUS
  Filled 2017-04-16 (×2): qty 50

## 2017-04-16 MED ORDER — ONDANSETRON HCL 4 MG/2ML IJ SOLN: 4.0000 mg | INTRAMUSCULAR | Status: DC | PRN

## 2017-04-16 MED ORDER — FENTANYL CITRATE (PF) 250 MCG/5ML IJ SOLN
INTRAMUSCULAR | Status: AC
  Filled 2017-04-16: qty 5

## 2017-04-16 MED ORDER — METOCLOPRAMIDE HCL 5 MG/ML IJ SOLN
10.0000 mg | Freq: Four times a day (QID) | INTRAMUSCULAR | Status: AC
  Administered 2017-04-16 – 2017-04-17 (×4): 10 mg via INTRAVENOUS
  Filled 2017-04-16 (×3): qty 2

## 2017-04-16 MED ORDER — PHENYLEPHRINE HCL 10 MG/ML IJ SOLN
INTRAVENOUS | Status: DC | PRN
  Administered 2017-04-16: 25 ug/min via INTRAVENOUS

## 2017-04-16 MED ORDER — MORPHINE SULFATE (PF) 2 MG/ML IV SOLN: 1.0000 mg | INTRAVENOUS | Status: DC | PRN

## 2017-04-16 MED ORDER — MIDAZOLAM HCL 2 MG/2ML IJ SOLN
INTRAMUSCULAR | Status: AC
  Filled 2017-04-16: qty 4

## 2017-04-16 MED ORDER — DEXMEDETOMIDINE HCL IN NACL 200 MCG/50ML IV SOLN
INTRAVENOUS | Status: AC
  Filled 2017-04-16: qty 50

## 2017-04-16 MED ORDER — ALBUTEROL SULFATE HFA 108 (90 BASE) MCG/ACT IN AERS: 2.0000 | INHALATION_SPRAY | Freq: Four times a day (QID) | RESPIRATORY_TRACT | Status: DC | PRN

## 2017-04-16 MED ORDER — NALOXONE HCL 0.4 MG/ML IJ SOLN: 0.4000 mg | INTRAMUSCULAR | Status: DC | PRN

## 2017-04-16 MED ORDER — CLINDAMYCIN PHOSPHATE 900 MG/50ML IV SOLN
900.0000 mg | INTRAVENOUS | Status: AC
  Administered 2017-04-16 (×2): 900 mg via INTRAVENOUS

## 2017-04-16 MED ORDER — ROCURONIUM BROMIDE 10 MG/ML (PF) SYRINGE
PREFILLED_SYRINGE | INTRAVENOUS | Status: AC
  Filled 2017-04-16: qty 5

## 2017-04-16 MED ORDER — OXYCODONE HCL 5 MG PO TABS: 5.0000 mg | ORAL_TABLET | Freq: Once | ORAL | Status: DC | PRN

## 2017-04-16 MED ORDER — ONDANSETRON HCL 4 MG/2ML IJ SOLN: 4.0000 mg | Freq: Four times a day (QID) | INTRAMUSCULAR | Status: DC | PRN

## 2017-04-16 MED ORDER — CHLORHEXIDINE GLUCONATE 0.12 % MT SOLN
15.0000 mL | OROMUCOSAL | Status: AC
  Administered 2017-04-16: 15 mL via OROMUCOSAL

## 2017-04-16 MED ORDER — FENTANYL CITRATE (PF) 100 MCG/2ML IJ SOLN
INTRAMUSCULAR | Status: DC | PRN
  Administered 2017-04-16 (×6): 50 ug via INTRAVENOUS
  Administered 2017-04-16: 150 ug via INTRAVENOUS
  Administered 2017-04-16 (×6): 50 ug via INTRAVENOUS

## 2017-04-16 MED ORDER — DEXAMETHASONE SODIUM PHOSPHATE 10 MG/ML IJ SOLN
INTRAMUSCULAR | Status: AC
  Filled 2017-04-16: qty 1

## 2017-04-16 MED ORDER — LIDOCAINE 2% (20 MG/ML) 5 ML SYRINGE
INTRAMUSCULAR | Status: AC
  Filled 2017-04-16: qty 5

## 2017-04-16 MED ORDER — SODIUM BICARBONATE 8.4 % IV SOLN
25.0000 meq | Freq: Once | INTRAVENOUS | Status: AC
  Administered 2017-04-16: 25 meq via INTRAVENOUS

## 2017-04-16 MED ORDER — HYDROMORPHONE HCL 1 MG/ML IJ SOLN: 0.2500 mg | INTRAMUSCULAR | Status: DC | PRN

## 2017-04-16 MED ORDER — OXYCODONE HCL 5 MG/5ML PO SOLN: 5.0000 mg | Freq: Once | ORAL | Status: DC | PRN

## 2017-04-16 MED ORDER — SUCCINYLCHOLINE CHLORIDE 200 MG/10ML IV SOSY
PREFILLED_SYRINGE | INTRAVENOUS | Status: DC | PRN
  Administered 2017-04-16: 160 mg via INTRAVENOUS

## 2017-04-16 MED ORDER — DEXMEDETOMIDINE HCL IN NACL 200 MCG/50ML IV SOLN
INTRAVENOUS | Status: DC | PRN
  Administered 2017-04-16: 13:00:00 via INTRAVENOUS
  Administered 2017-04-16: 0.7 ug/kg/h via INTRAVENOUS

## 2017-04-16 MED ORDER — ALBUTEROL SULFATE (2.5 MG/3ML) 0.083% IN NEBU: 2.5000 mg | INHALATION_SOLUTION | RESPIRATORY_TRACT | Status: DC | PRN

## 2017-04-16 MED ORDER — ARTIFICIAL TEARS OPHTHALMIC OINT
TOPICAL_OINTMENT | OPHTHALMIC | Status: DC | PRN
  Administered 2017-04-16: 1 via OPHTHALMIC

## 2017-04-16 MED ORDER — DIPHENHYDRAMINE HCL 12.5 MG/5ML PO ELIX
12.5000 mg | ORAL_SOLUTION | Freq: Four times a day (QID) | ORAL | Status: DC | PRN
Start: 2017-04-16 — End: 2017-04-25
  Filled 2017-04-16: qty 5

## 2017-04-16 MED ORDER — DEXAMETHASONE SODIUM PHOSPHATE 10 MG/ML IJ SOLN
INTRAMUSCULAR | Status: DC | PRN
  Administered 2017-04-16: 10 mg via INTRAVENOUS

## 2017-04-16 MED ORDER — POTASSIUM CHLORIDE 10 MEQ/50ML IV SOLN
10.0000 meq | INTRAVENOUS | Status: DC | PRN
  Administered 2017-04-16 (×3): 10 meq via INTRAVENOUS
  Filled 2017-04-16 (×3): qty 50

## 2017-04-16 MED ORDER — IPRATROPIUM-ALBUTEROL 0.5-2.5 (3) MG/3ML IN SOLN: 3.0000 mL | Freq: Four times a day (QID) | RESPIRATORY_TRACT | Status: DC | PRN

## 2017-04-16 MED ORDER — DEXMEDETOMIDINE HCL IN NACL 400 MCG/100ML IV SOLN
0.0000 ug/kg/h | INTRAVENOUS | Status: DC
  Administered 2017-04-17: 0.2 ug/kg/h via INTRAVENOUS
  Filled 2017-04-16 (×3): qty 100

## 2017-04-16 MED ORDER — MIDAZOLAM HCL 5 MG/5ML IJ SOLN
INTRAMUSCULAR | Status: DC | PRN
  Administered 2017-04-16 (×4): 1 mg via INTRAVENOUS

## 2017-04-16 MED ORDER — MORPHINE SULFATE (PF) 4 MG/ML IV SOLN
1.0000 mg | INTRAVENOUS | Status: AC | PRN
  Administered 2017-04-16: 4 mg via INTRAVENOUS
  Administered 2017-04-16: 1 mg via INTRAVENOUS
  Administered 2017-04-17 (×2): 2 mg via INTRAVENOUS
  Administered 2017-04-17: 4 mg via INTRAVENOUS
  Filled 2017-04-16 (×5): qty 1

## 2017-04-16 MED ORDER — DEXTROSE 5 % IV SOLN
900.0000 mg | INTRAVENOUS | Status: DC
  Filled 2017-04-16: qty 6

## 2017-04-16 MED ORDER — LACTATED RINGERS IV SOLN
INTRAVENOUS | Status: DC | PRN
  Administered 2017-04-16 (×3): via INTRAVENOUS

## 2017-04-16 MED ORDER — EPHEDRINE 5 MG/ML INJ
INTRAVENOUS | Status: AC
  Filled 2017-04-16: qty 10

## 2017-04-16 MED ORDER — MIDAZOLAM HCL 10 MG/2ML IJ SOLN
INTRAMUSCULAR | Status: AC
  Filled 2017-04-16: qty 4

## 2017-04-16 MED ORDER — DIPHENHYDRAMINE HCL 50 MG/ML IJ SOLN: 12.5000 mg | Freq: Four times a day (QID) | INTRAMUSCULAR | Status: DC | PRN

## 2017-04-16 MED ORDER — CLINDAMYCIN PHOSPHATE 900 MG/50ML IV SOLN
INTRAVENOUS | Status: AC
  Filled 2017-04-16: qty 50

## 2017-04-16 MED ORDER — DEXMEDETOMIDINE HCL IN NACL 400 MCG/100ML IV SOLN
0.4000 ug/kg/h | INTRAVENOUS | Status: DC
  Filled 2017-04-16: qty 100

## 2017-04-16 MED ORDER — FLUTICASONE PROPIONATE 50 MCG/ACT NA SUSP
1.0000 | Freq: Every day | NASAL | Status: DC | PRN
Start: 2017-04-16 — End: 2017-04-29
  Filled 2017-04-16: qty 16

## 2017-04-16 MED ORDER — PHENYLEPHRINE 40 MCG/ML (10ML) SYRINGE FOR IV PUSH (FOR BLOOD PRESSURE SUPPORT)
PREFILLED_SYRINGE | INTRAVENOUS | Status: DC | PRN
  Administered 2017-04-16: 40 ug via INTRAVENOUS
  Administered 2017-04-16: 80 ug via INTRAVENOUS
  Administered 2017-04-16: 40 ug via INTRAVENOUS

## 2017-04-16 MED ORDER — ALBUMIN HUMAN 5 % IV SOLN: INTRAVENOUS | Status: DC | PRN

## 2017-04-16 MED ORDER — SUCCINYLCHOLINE CHLORIDE 200 MG/10ML IV SOSY
PREFILLED_SYRINGE | INTRAVENOUS | Status: AC
  Filled 2017-04-16: qty 10

## 2017-04-16 MED ORDER — SODIUM CHLORIDE 0.9% FLUSH
9.0000 mL | INTRAVENOUS | Status: DC | PRN
Start: 2017-04-16 — End: 2017-04-25

## 2017-04-16 SURGICAL SUPPLY — 137 items
ADH SKN CLS APL DERMABOND .7 (GAUZE/BANDAGES/DRESSINGS) ×4
ADPR CATH UNV TPR FL F LL (CATHETERS) ×2
BRUSH CYTOL CELLEBRITY 1.5X140 (MISCELLANEOUS) IMPLANT
CANISTER SUCT 3000ML PPV (MISCELLANEOUS) ×5 IMPLANT
CATH FOLEY 2WAY SLVR 18FR 30CC (CATHETERS) ×3 IMPLANT
CATH ROBINSON RED A/P 18FR (CATHETERS) ×3 IMPLANT
CATH THORACIC 28FR (CATHETERS) ×1 IMPLANT
CLIP FOGARTY SPRING 6M (CLIP) ×3 IMPLANT
CLIP VESOCCLUDE LG 6/CT (CLIP) ×1 IMPLANT
CLIP VESOCCLUDE MED 24/CT (CLIP) ×3 IMPLANT
CLIP VESOCCLUDE SM WIDE 24/CT (CLIP) ×3 IMPLANT
CONNECTOR CATH FOLEY FEMALE LL (CATHETERS) ×1 IMPLANT
CONT SPEC 4OZ CLIKSEAL STRL BL (MISCELLANEOUS) ×4 IMPLANT
COVER BACK TABLE 60X90IN (DRAPES) ×3 IMPLANT
COVER OVERHEAD TABLE (DRAPES) ×1 IMPLANT
COVER PROBE W GEL 5X96 (DRAPES) ×6 IMPLANT
COVER SURGICAL LIGHT HANDLE (MISCELLANEOUS) ×2 IMPLANT
DERMABOND ADVANCED (GAUZE/BANDAGES/DRESSINGS) ×2
DERMABOND ADVANCED .7 DNX12 (GAUZE/BANDAGES/DRESSINGS) ×4 IMPLANT
DRAIN CHANNEL 28F RND 3/8 FF (WOUND CARE) ×2 IMPLANT
DRAIN PENROSE 1/2X36 STERILE (WOUND CARE) ×4 IMPLANT
DRAIN SUMP SARATOGA 24F (WOUND CARE) ×3 IMPLANT
DRAPE CAMERA CLOSED 9X96 (DRAPES) ×2 IMPLANT
DRAPE CAMERA VIDEO/LASER (DRAPES) ×2 IMPLANT
DRAPE CV SPLIT W-CLR ANES SCRN (DRAPES) ×3 IMPLANT
DRAPE INCISE IOBAN 66X45 STRL (DRAPES) ×6 IMPLANT
DRAPE PERI GROIN 82X75IN TIB (DRAPES) ×3 IMPLANT
DRAPE PROXIMA HALF (DRAPES) ×2 IMPLANT
DRAPE SLUSH/WARMER DISC (DRAPES) ×1 IMPLANT
DRSG AQUACEL AG ADV 3.5X 6 (GAUZE/BANDAGES/DRESSINGS) ×1 IMPLANT
DRSG AQUACEL AG ADV 3.5X14 (GAUZE/BANDAGES/DRESSINGS) ×3 IMPLANT
ELECT BLADE 4.0 EZ CLEAN MEGAD (MISCELLANEOUS) ×3
ELECT BLADE 6.5 EXT (BLADE) ×1 IMPLANT
ELECT NDL TIP 2.8 STRL (NEEDLE) IMPLANT
ELECT NEEDLE TIP 2.8 STRL (NEEDLE) ×3 IMPLANT
ELECT REM PT RETURN 9FT ADLT (ELECTROSURGICAL) ×3
ELECTRODE BLDE 4.0 EZ CLN MEGD (MISCELLANEOUS) ×2 IMPLANT
ELECTRODE REM PT RTRN 9FT ADLT (ELECTROSURGICAL) ×4 IMPLANT
FORCEPS BIOP RJ4 1.8 (CUTTING FORCEPS) IMPLANT
GAUZE SPONGE 4X4 12PLY STRL (GAUZE/BANDAGES/DRESSINGS) ×4 IMPLANT
GAUZE SPONGE 4X4 12PLY STRL LF (GAUZE/BANDAGES/DRESSINGS) ×2 IMPLANT
GLOVE BIO SURGEON STRL SZ 6.5 (GLOVE) ×11 IMPLANT
GLOVE BIO SURGEON STRL SZ7 (GLOVE) ×4 IMPLANT
GLOVE BIOGEL PI IND STRL 6.5 (GLOVE) IMPLANT
GLOVE BIOGEL PI IND STRL 7.0 (GLOVE) IMPLANT
GLOVE BIOGEL PI INDICATOR 6.5 (GLOVE) ×1
GLOVE BIOGEL PI INDICATOR 7.0 (GLOVE) ×1
GOWN STRL REUS W/ TWL LRG LVL3 (GOWN DISPOSABLE) ×6 IMPLANT
GOWN STRL REUS W/TWL LRG LVL3 (GOWN DISPOSABLE) ×12
HANDLE STAPLE ENDO GIA SHORT (STAPLE) ×1
HEMOSTAT SURGICEL 2X14 (HEMOSTASIS) IMPLANT
INSERT FOGARTY 61MM (MISCELLANEOUS) IMPLANT
KIT BASIN OR (CUSTOM PROCEDURE TRAY) ×3 IMPLANT
KIT CLEAN ENDO COMPLIANCE (KITS) ×3 IMPLANT
KIT ROOM TURNOVER OR (KITS) ×3 IMPLANT
KIT SUCTION CATH 14FR (SUCTIONS) ×4 IMPLANT
LOOP VESSEL MINI RED (MISCELLANEOUS) IMPLANT
MARKER SKIN DUAL TIP RULER LAB (MISCELLANEOUS) IMPLANT
NS IRRIG 1000ML POUR BTL (IV SOLUTION) ×6 IMPLANT
OIL SILICONE PENTAX (PARTS (SERVICE/REPAIRS)) ×4 IMPLANT
PACK CHEST (CUSTOM PROCEDURE TRAY) ×3 IMPLANT
PAD ARMBOARD 7.5X6 YLW CONV (MISCELLANEOUS) ×6 IMPLANT
PAD SHARPS MAGNETIC DISPOSAL (MISCELLANEOUS) ×3 IMPLANT
PENCIL BUTTON HOLSTER BLD 10FT (ELECTRODE) IMPLANT
PLUG CATH AND CAP STER (CATHETERS) ×2 IMPLANT
PROBE NERVBE PRASS .33 (MISCELLANEOUS) ×3 IMPLANT
PROBE PENCIL 8 MHZ STRL DISP (MISCELLANEOUS) ×3 IMPLANT
RELOAD EGIA 45 MED/THCK PURPLE (STAPLE) IMPLANT
RELOAD EGIA TRIS TAN 45 CVD (STAPLE) IMPLANT
RELOAD ENDO GIA 30 3.5 (STAPLE) IMPLANT
RELOAD LINEAR CUT PROX 55 BLUE (ENDOMECHANICALS) ×18 IMPLANT
RELOAD STAPLE 30 PURP MED/THCK (STAPLE) IMPLANT
RELOAD STAPLE 35X2.5 WHT THIN (STAPLE) IMPLANT
RELOAD STAPLE 45 TAN MED CVD (STAPLE) IMPLANT
RELOAD STAPLE 55 3.8 BLU REG (ENDOMECHANICALS) ×6 IMPLANT
RELOAD TRI 2.0 30 MED THCK SUL (STAPLE) ×3 IMPLANT
RETAINER VISCERA MED (MISCELLANEOUS) ×3 IMPLANT
RETRACTOR WND ALEXIS 25 LRG (MISCELLANEOUS) ×2 IMPLANT
RTRCTR WOUND ALEXIS 25CM LRG (MISCELLANEOUS) ×3
SEALER TISSUE X1 CVD JAW (INSTRUMENTS) ×3 IMPLANT
SHEARS HARMONIC ACE PLUS 36CM (ENDOMECHANICALS) ×3 IMPLANT
SPONGE LAP 18X18 X RAY DECT (DISPOSABLE) ×9 IMPLANT
SPONGE TONSIL 1 RF SGL (DISPOSABLE) ×1 IMPLANT
STAPLE RELOAD 2.5MM WHITE (STAPLE) ×6 IMPLANT
STAPLER ENDO GIA 12 SHRT THIN (STAPLE) ×2 IMPLANT
STAPLER ENDO GIA 12MM SHORT (STAPLE) ×2 IMPLANT
STAPLER PROXIMATE 55 BLUE (STAPLE) ×5 IMPLANT
STAPLER VASCULAR ECHELON 35 (CUTTER) ×1 IMPLANT
STAPLER VISISTAT 35W (STAPLE) IMPLANT
SUCTION POOLE TIP (SUCTIONS) ×1 IMPLANT
SUT BONE WAX W31G (SUTURE) ×1 IMPLANT
SUT ETHILON 3 0 FSL (SUTURE) ×6 IMPLANT
SUT PDS AB 4-0 SH 27 (SUTURE) IMPLANT
SUT PROLENE 0 CT 1 CR/8 (SUTURE) IMPLANT
SUT PROLENE 1 XLH (SUTURE) ×3 IMPLANT
SUT PROLENE 2 0 MH 48 (SUTURE) IMPLANT
SUT PROLENE 2 TP 1 (SUTURE) ×5 IMPLANT
SUT PROLENE 3 0 RB 1 (SUTURE) IMPLANT
SUT PROLENE 3 0 SH1 36 (SUTURE) ×4 IMPLANT
SUT PROLENE 4 0 RB 1 (SUTURE)
SUT PROLENE 4-0 RB1 .5 CRCL 36 (SUTURE) IMPLANT
SUT SILK  1 MH (SUTURE) ×6
SUT SILK 1 MH (SUTURE) ×2 IMPLANT
SUT SILK 1 TIES 10X30 (SUTURE) ×3 IMPLANT
SUT SILK 2 0 SH CR/8 (SUTURE) ×3 IMPLANT
SUT SILK 2 0SH CR/8 30 (SUTURE) ×3 IMPLANT
SUT SILK 3 0 SH CR/8 (SUTURE) ×1 IMPLANT
SUT SILK 3 0SH CR/8 30 (SUTURE) ×2 IMPLANT
SUT SILK 4 0 SH CR/8 (SUTURE) ×4 IMPLANT
SUT VIC AB 1 CTX 27 (SUTURE) IMPLANT
SUT VIC AB 2-0 CT1 18 (SUTURE) ×1 IMPLANT
SUT VIC AB 2-0 CTX 36 (SUTURE) ×4 IMPLANT
SUT VIC AB 3-0 MH 27 (SUTURE) IMPLANT
SUT VIC AB 3-0 SH 18 (SUTURE) IMPLANT
SUT VIC AB 3-0 SH 8-18 (SUTURE) ×1 IMPLANT
SUT VIC AB 3-0 X1 27 (SUTURE) ×6 IMPLANT
SUT VIC AB 4-0 SH 18 (SUTURE) ×9 IMPLANT
SUT VICRYL 2 TP 1 (SUTURE) IMPLANT
SYR 10ML LL (SYRINGE) IMPLANT
SYR 20CC LL (SYRINGE) IMPLANT
SYR 20ML ECCENTRIC (SYRINGE) ×3 IMPLANT
SYR 30ML SLIP (SYRINGE) ×3 IMPLANT
SYR 5ML LUER SLIP (SYRINGE) ×2 IMPLANT
SYR TOOMEY 50ML (SYRINGE) ×3 IMPLANT
SYSTEM SAHARA CHEST DRAIN RE-I (WOUND CARE) ×4 IMPLANT
TAPE UMBILICAL 1/8 X36 TWILL (MISCELLANEOUS) ×3 IMPLANT
TOWEL OR 17X24 6PK STRL BLUE (TOWEL DISPOSABLE) ×4 IMPLANT
TOWEL OR 17X26 10 PK STRL BLUE (TOWEL DISPOSABLE) ×5 IMPLANT
TRAP SPECIMEN MUCOUS 40CC (MISCELLANEOUS) ×1 IMPLANT
TRAY FOLEY W/METER SILVER 16FR (SET/KITS/TRAYS/PACK) ×3 IMPLANT
TUBE CONNECTING 12X1/4 (SUCTIONS) ×1 IMPLANT
TUBE CONNECTING 20X1/4 (TUBING) ×3 IMPLANT
TUBE ENDOTRAC EMG 7X10.2 (MISCELLANEOUS) IMPLANT
TUBE ENDOTRAC EMG 8X11.3 (MISCELLANEOUS) ×1 IMPLANT
TUBE ENDOTRACH  EMG 6MMTUBE EN (MISCELLANEOUS)
TUBE ENDOTRACH EMG 6MMTUBE EN (MISCELLANEOUS) IMPLANT
WATER STERILE IRR 1000ML POUR (IV SOLUTION) ×4 IMPLANT

## 2017-04-16 NOTE — Anesthesia Postprocedure Evaluation (Signed)
Anesthesia Post Note  Patient: Darrell Cunningham  Procedure(s) Performed: Procedure(s) (LRB): VIDEO BRONCHOSCOPY, Transhiatal Total Esophagectomy, Esophagogastrostomy, pyloromotomy, Feeding Jejunostomy (N/A) ESOPHAGECTOMY COMPLETE,Transhiatal total esophagectomy (N/A) JEJUNOSTOMY,Feeding (N/A)     Patient location during evaluation: SICU Anesthesia Type: General Level of consciousness: patient remains intubated per anesthesia plan and sedated Pain management: pain level controlled Vital Signs Assessment: post-procedure vital signs reviewed and stable Respiratory status: patient remains intubated per anesthesia plan and patient on ventilator - see flowsheet for VS Cardiovascular status: blood pressure returned to baseline Anesthetic complications: no    Last Vitals:  Vitals:   04/16/17 1915 04/16/17 2001  BP: 96/64   Pulse: 78   Resp: 20   Temp:  37.3 C  SpO2: 100%     Last Pain:  Vitals:   04/16/17 2001  TempSrc: Oral                 Hawley Pavia COKER

## 2017-04-16 NOTE — Transfer of Care (Signed)
Immediate Anesthesia Transfer of Care Note  Patient: Darrell Cunningham  Procedure(s) Performed: Procedure(s): VIDEO BRONCHOSCOPY, Transhiatal Total Esophagectomy, Esophagogastrostomy, pyloromotomy, Feeding Jejunostomy (N/A) ESOPHAGECTOMY COMPLETE,Transhiatal total esophagectomy (N/A) JEJUNOSTOMY,Feeding (N/A)  Patient Location: ICU  Anesthesia Type:General  Level of Consciousness: sedated, unresponsive and Patient remains intubated per anesthesia plan  Airway & Oxygen Therapy: Patient remains intubated per anesthesia plan and Patient placed on Ventilator (see vital sign flow sheet for setting)  Post-op Assessment: Report given to RN and Post -op Vital signs reviewed and stable  Post vital signs: Reviewed and stable  Last Vitals:  Vitals:   04/16/17 1602 04/16/17 1603  BP: (!) 87/53   Pulse: 77   Resp:    Temp:    SpO2: 100% 100%    Last Pain:  Vitals:   04/16/17 0543  TempSrc: Oral      Patients Stated Pain Goal: 4 (51/88/41 [id number])  Complications: No apparent anesthesia complications

## 2017-04-16 NOTE — OR Nursing (Signed)
Spoke with Varney Biles in histology and relayed message for frozen section on entire gastric margin

## 2017-04-16 NOTE — Anesthesia Procedure Notes (Signed)
Central Venous Catheter Insertion Performed by: Roberts Gaudy, anesthesiologist Start/End9/17/2018 7:15 AM, 04/16/2017 7:20 AM Patient location: Pre-op. Preanesthetic checklist: patient identified, IV checked, site marked, risks and benefits discussed, surgical consent, monitors and equipment checked, pre-op evaluation, timeout performed and anesthesia consent Lidocaine 1% used for infiltration and patient sedated Hand hygiene performed  and maximum sterile barriers used  Catheter size: 8 Fr Total catheter length 16. Central line was placed.Double lumen Procedure performed using ultrasound guided technique. Ultrasound Notes:image(s) printed for medical record Attempts: 1 Following insertion, dressing applied and line sutured. Post procedure assessment: blood return through all ports  Patient tolerated the procedure well with no immediate complications.

## 2017-04-16 NOTE — Anesthesia Preprocedure Evaluation (Signed)
Anesthesia Evaluation  Patient identified by MRN, date of birth, ID band Patient awake    Reviewed: Allergy & Precautions, NPO status , Patient's Chart, lab work & pertinent test results  History of Anesthesia Complications Negative for: history of anesthetic complications  Airway Mallampati: III  TM Distance: >3 FB Neck ROM: Full    Dental  (+) Edentulous Upper, Edentulous Lower   Pulmonary neg shortness of breath, asthma , neg sleep apnea, COPD,  COPD inhaler, neg recent URI, former smoker,    breath sounds clear to auscultation       Cardiovascular negative cardio ROS   Rhythm:Regular     Neuro/Psych neg Seizures  Neuromuscular disease negative psych ROS   GI/Hepatic Neg liver ROS, GERD  ,Esophageal Ca s/p chemo and radiation   Endo/Other  negative endocrine ROS  Renal/GU negative Renal ROS     Musculoskeletal  (+) Arthritis ,   Abdominal   Peds  Hematology negative hematology ROS (+)   Anesthesia Other Findings   Reproductive/Obstetrics                             Anesthesia Physical Anesthesia Plan  ASA: III  Anesthesia Plan: General   Post-op Pain Management:    Induction: Intravenous  PONV Risk Score and Plan: 2 and Ondansetron and Dexamethasone  Airway Management Planned: Oral ETT  Additional Equipment: Arterial line, CVP and Ultrasound Guidance Line Placement  Intra-op Plan:   Post-operative Plan: Extubation in OR and Possible Post-op intubation/ventilation  Informed Consent: I have reviewed the patients History and Physical, chart, labs and discussed the procedure including the risks, benefits and alternatives for the proposed anesthesia with the patient or authorized representative who has indicated his/her understanding and acceptance.   Dental advisory given  Plan Discussed with: CRNA and Surgeon  Anesthesia Plan Comments:         Anesthesia Quick  Evaluation

## 2017-04-16 NOTE — Anesthesia Procedure Notes (Signed)
Arterial Line Insertion Start/End9/17/2018 7:12 AM, 04/16/2017 7:22 AM Performed by: Everlean Cherry A  Patient location: Pre-op. Preanesthetic checklist: patient identified, IV checked, site marked, risks and benefits discussed, surgical consent, monitors and equipment checked, pre-op evaluation, timeout performed and anesthesia consent Lidocaine 1% used for infiltration and patient sedated Left, radial was placed Catheter size: 20 G Hand hygiene performed , maximum sterile barriers used  and Seldinger technique used Allen's test indicative of satisfactory collateral circulation Attempts: 1 Procedure performed without using ultrasound guided technique. Following insertion, dressing applied and Biopatch. Post procedure assessment: normal and unchanged  Patient tolerated the procedure well with no immediate complications.

## 2017-04-16 NOTE — Progress Notes (Signed)
[address]       [place] [address]             [phone]                    Baylin G Burkes Fisher Medical Record #[id number] Date of Birth: 1961/04/29  Referring: [name], MD Primary Care: [name], MD Oncology: Dr [name]  Chief Complaint:    Chief Complaint  Patient presents with  . Esophageal Cancer         History of Present Illness:    EUAL LINDSTROM 56 y.o. male has been followed in the office for esophageal cancer  , Surgical resection had been recommended to him in the past on his last visit he declined to proceed with surgery . He now returns to further discuss resection for esophageal cancer.     His symptoms began with increasing difficulty with pain with swallowing and with regurgitation. Because of these symptoms he underwent upper GI endoscopy by Dr. [name].  fungating mass extending up proximally 7 cm in the distal esophagus. CT scan suggests esophageal lymph nodes and a 2.3 cm mass. Biopsy were taken suggestive of adenocarcinoma but not diagnostic. Repeat biopsy was done 3/29 [id number] Diagnosis Esophagogastric junction, biopsy, mass - INVASIVE ADENOCARCINOMA. Microscopic Comment Dr. Lyndon Code has seen this case in consultation with agreement. The findings are called to Dr. Ardis Hughs on 10/27/2016. 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 He is currently a nonsmoker having quit 4 years ago prior to that he smoked 1-2 packs per day for 38 years. He denies any alcohol intake for the last 20 years.  Patient completed chemotherapy on May 8 and radiation on May 10  Wt Readings from Last 3 Encounters:  04/12/17 158 lb 9.6 oz (71.9 kg)  04/05/17 157 lb (71.2 kg)  03/14/17 159 lb 12.8 oz (72.5 kg)   Current Activity/ Functional Status:  Patient is independent with mobility/ambulation, transfers,  ADL's, IADL's.   Zubrod Score: At the time of surgery this patient's most appropriate activity status/level should be described as: []     0    Normal activity, no symptoms [x]     1    Restricted in physical strenuous activity but ambulatory, able to do out light work []     2    Ambulatory and capable of self care, unable to do work activities, up and about               >50 % of waking hours                              []     3    Only limited self care, in bed greater than 50% of waking hours []     4    Completely disabled, no self care, confined to bed or chair []     5    Moribund   Past Medical History:  Diagnosis Date  . Allergy   . Arthritis   . Asthma    as a child  . Cancer ([place])   . 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 Dysphagia   .       Marland Kitchen GERD (gastroesophageal reflux disease)   . Hyperlipidemia   .  Neuromuscular disorder ([place])   . Pneumonia 4 yrs ago    Past Surgical History:  Procedure Laterality Date  . EUS N/A 10/26/2016   Procedure: UPPER ENDOSCOPIC ULTRASOUND (EUS) LINEAR;  Surgeon: [name], MD;  Location: [facility];  Service: Endoscopy;  Laterality: N/A;  . EYE SURGERY Bilateral age 5    for cross eyes  . IR FLUORO GUIDE PORT INSERTION RIGHT  11/08/2016  . IR US GUIDE VASC ACCESS RIGHT  11/08/2016  . MOUTH SURGERY      Family History  Problem Relation Age of Onset  . Colon cancer Maternal Grandmother 10  . Breast cancer Maternal Grandmother 82  . Breast cancer Sister 51       Deceased at 56 of breast cancer  . Ovarian cancer Maternal Aunt   . Stomach cancer Neg Hx   . Esophageal cancer Neg Hx     Social History   Social History  . Marital status: Legally Separated    Spouse name: N/A  . Number of children: N/A  . Years of education: N/A   Occupational History  . Not on file.   Social History Main Topics  . Smoking status: Former Smoker    Packs/day: 1.00    Years: 38.00    Types: Cigarettes    Start date: 10/31/2012     Quit date: 10/31/2012  . Smokeless tobacco: Never Used  . Alcohol use No  . Drug use: No  . Sexual activity: Not on file   Other Topics Concern  . Not on file   Social History Narrative  . No narrative on file    History  Smoking Status  . Former Smoker  . Packs/day: 1.00  . Years: 38.00  . Types: Cigarettes  . Start date: 10/31/2012  . Quit date: 10/31/2012  Smokeless Tobacco  . Never Used    History  Alcohol Use No     Allergies  Allergen Reactions  . Penicillins Hives, Itching and Swelling    Has patient had a PCN reaction causing immediate rash, facial/tongue/throat swelling, SOB or lightheadedness with hypotension: No Has patient had a PCN reaction causing severe rash involving mucus membranes or skin necrosis: No Has patient had a PCN reaction that required hospitalization No Has patient had a PCN reaction occurring within the last 10 years: No If all of the above answers are "NO", then may proceed with Cephalosporin use. Unknown Childhood reaction    Current Facility-Administered Medications  Medication Dose Route Frequency Provider Last Rate Last Dose  . clindamycin (CLEOCIN) 900 MG/50ML IVPB           . clindamycin (CLEOCIN) IVPB 900 mg  900 mg Intravenous On Call to OR [name], MD       Facility-Administered Medications Ordered in Other Encounters  Medication Dose Route Frequency Provider Last Rate Last Dose  . fentaNYL (SUBLIMAZE) injection    Anesthesia Intra-op Everlean Cherry A, CRNA   50 mcg at 04/16/17 0710  . lactated ringers infusion    Continuous PRN Colin Benton, CRNA      . midazolam (VERSED) 5 MG/5ML injection    Anesthesia Intra-op Everlean Cherry A, CRNA   1 mg at 04/16/17 2035      Review of Systems:     Cardiac Review of Systems: Y or N  Chest Pain [ n   ]  Resting SOB [ n] Exertional SOB  [n  ]  Vertell Limber Florencio.Farrier ]   Pedal Edema Florencio.Farrier   ]  Palpitations [n  ] Syncope  Florencio.Farrier  ]   Presyncope [ n  ]  General Review of Systems: [Y] =  yes [  ]=no Constitional: recent weight change [  ];  Wt loss over the last 3 months [   ] anorexia [  ]; fatigue [  ]; nausea [  ]; night sweats [  ]; fever [  ]; or chills [  ];          Dental: poor dentition[  ]; Last Dentist visit:   Eye : blurred vision [  ]; diplopia [   ]; vision changes [  ];  Amaurosis fugax[  ]; Resp: cough [  ];  wheezing[  ];  hemoptysis[  ]; shortness of breath[  ]; paroxysmal nocturnal dyspnea[  ]; dyspnea on exertion[  ]; or orthopnea[  ];  GI:  gallstones[  ], vomiting[  ];  dysphagia[  ]; melena[  ];  hematochezia [  ]; heartburn[  ];   Hx of  Colonoscopy[  ]; GU: kidney stones [  ]; hematuria[  ];   dysuria [  ];  nocturia[  ];  history of     obstruction [  ]; urinary frequency [  ]             Skin: rash, swelling[  ];, hair loss[  ];  peripheral edema[  ];  or itching[  ]; Musculosketetal: myalgias[  ];  joint swelling[  ];  joint erythema[  ];  joint pain[  ];  back pain[  ];  Heme/Lymph: bruising[  ];  bleeding[  ];  anemia[  ];  Neuro: TIA[  ];  headaches[  ];  stroke[  ];  vertigo[  ];  seizures[  ];   paresthesias[  ];  difficulty walking[  ];  Psych:depression[  ]; anxiety[  ];  Endocrine: diabetes[  ];  thyroid dysfunction[  ];  Immunizations: Flu up to date [  ]; Pneumococcal up to date [  ];  Other:  Physical Exam: BP (!) 151/82   Pulse 98   Temp 98.2 F (36.8 C) (Oral)   Resp 20   SpO2 100%   PHYSICAL EXAMINATION: Physical Exam  Constitutional: He is oriented to person, place, and time. He appears well-developed and well-nourished. No distress.  HENT:  Mouth/Throat: No oropharyngeal exudate.  Eyes: Right eye exhibits no discharge. Left eye exhibits no discharge. No scleral icterus.  Neck: No JVD present. No tracheal deviation present. No thyromegaly present.  Cardiovascular: Normal rate and regular rhythm.  Exam reveals no gallop and no friction rub.   No murmur heard. Respiratory: No stridor. No respiratory distress. He has no  wheezes. He has no rales. He exhibits no tenderness.  GI: He exhibits no distension and no mass. There is no tenderness. There is no rebound and no guarding.  Musculoskeletal: He exhibits no edema, tenderness or deformity.  Lymphadenopathy:    He has no cervical adenopathy.  Neurological: He is alert and oriented to person, place, and time. No cranial nerve deficit. Coordination normal.  Psychiatric: He has a normal mood and affect. His behavior is normal. Judgment and thought content normal.      Diagnostic Studies & Laboratory data:   Diagnosis Esophagus, biopsy, distal esophageal tumor (33-39) - SUSPICIOUS FOR ADENOCARCINOMA, SEE COMMENT. Microscopic Comment The majority of fragments are reactive squamous mucosa with associated acute inflammation. There is a small fragment of atypical glandular epithelium with associated ulceration that is suspicious for  adenocarcinoma. There is no background Barrett's esophagus in the glandular component. Dr. Saralyn Pilar has reviewed the case. The case was called to Dr. [name] on 10/10/2016. [name] MD Pathologist, Electronic  - A large, fungating mass with no bleeding until contact was found in the lower third of the esophagus, 33 cm from the incisors and extending to the GE junction at 39 cm. The mass was partially obstructing and partially circumferential (involving one-half of the lumen circumference). Multiple biopsies were obtained with cold forceps for histology in a targeted manner. Findings: - The perviously described mass was also seen on retroflexion in the stomach at the gastroesophageal junction. - The exam of the stomach was otherwise normal. - The examined duodenum was normal.      EUS  Endoscopic Finding Findings: A medium-sized, fungating, very friable mass was found at the gastroesophageal junction. The 6cm long mass was partially obstructing and partially circumferential (involving two thirds of the  lumen circumference). The bulk of the mass lays in the distal esophagus but it does stradle the GE junction with about 2cm of tumor below the Z line. Biopsies were taken with a cold forceps for histology. Endosonographic Finding 1. The mass above correlated with a hypoechoic and heterogenous lesion at the gastroesophageal junction. This ranges from 33 cm from the incisors and extended to 39 cm. The lesion was partially circumferential (involving two-thirds of the lumen circumference). The endosonographic borders were poorly-defined. The mass measured up to 16 mm in thickness. There was sonographic evidence suggesting invasion into the adventitia (Layer 5, UT3). 2. There was one 1.3cm round, hypoechoic, heterogeneous lymphnode adjacent to the mass. This is presumed to be malignant based on EUS criteria (uN1). 3. Limited views of pancreas, spleen, liver were all normal.    Recent Radiology Findings:   Dg Chest 2 View  Result Date: 01/01/2017 CLINICAL DATA:  Congestion, cough and dyspnea x3 days. Esophageal carcinoma. Post chemotherapy and radiation. EXAM: CHEST  2 VIEW COMPARISON:  10/12/2016 CT chest. FINDINGS: The heart size and mediastinal contours are within normal limits. Right-sided port catheter tip in the cavoatrial junction. Both lungs are clear. No dilatation of the esophagus in this patient with known distal esophageal mass like abnormality on CT. The visualized skeletal structures are unremarkable. IMPRESSION: No active cardiopulmonary disease. Electronically Signed   By: Ashley Royalty M.D.   On: 01/01/2017 21:38   Result Date: 01/18/2017 CLINICAL DATA:  Restaging esophageal cancer. EXAM: CT CHEST, ABDOMEN, AND PELVIS WITH CONTRAST TECHNIQUE: Multidetector CT imaging of the chest, abdomen and pelvis was performed following the standard protocol during bolus administration of intravenous contrast. CONTRAST:  129mL ISOVUE-300 IOPAMIDOL (ISOVUE-300) INJECTION 61% COMPARISON:  12/14/2016  abdominal/ pelvic CT scan and PET-CT 10/24/2016 FINDINGS: CT CHEST FINDINGS Cardiovascular: The heart is normal in size. No pericardial effusion. Mild stable tortuosity and calcification of the thoracic aorta and branch vessels. No focal aneurysm or dissection. Stable age advanced three-vessel coronary artery calcifications. Right-sided Port-A-Cath in good position without complicating features. Mediastinum/Nodes: No mediastinal or hilar mass or lymphadenopathy. Mild wall stable distal esophageal wall thickening likely due to radiation. No obvious recurrent esophageal mass. Small paraesophageal lymph nodes. The largest measures 3.5 mm on image number 20. This is stable when compared to the prior PET-CT. Lungs/Pleura: Stable mild/early emphysematous changes. No acute pulmonary findings. No worrisome pulmonary nodules to suggest pulmonary metastatic disease. No pleural effusion or pleural nodules. Chest wall/ Musculoskeletal: Right-sided Port-A-Cath is stable. No breast masses, supraclavicular or axillary lymphadenopathy. The thyroid gland  appears normal. No significant bony findings. No evidence of osseous metastatic disease. CT ABDOMEN PELVIS FINDINGS Hepatobiliary: No focal hepatic lesions to suggest metastatic disease. The gallbladder is normal. No common bile duct dilatation. Pancreas: No mass, inflammation or ductal dilatation. Spleen: Normal size.  No focal lesions. Adrenals/Urinary Tract: The adrenal glands and kidneys are unremarkable and stable. No renal, ureteral or bladder calculi or mass. Stomach/Bowel: The stomach, duodenum, small bowel and colon are unremarkable. No inflammatory changes, mass lesions or obstructive findings. Vascular/Lymphatic: Stable age advanced atherosclerotic calcifications involving the aorta and iliac arteries. No focal aneurysm or dissection. The branch vessels are patent. The major venous structures are patent. No mesenteric or retroperitoneal mass or lymphadenopathy. Small  scattered lymph nodes are stable. Reproductive: The prostate gland and seminal vesicles are unremarkable. Other: No pelvic mass or adenopathy. No free pelvic fluid collections. No inguinal mass or adenopathy. No abdominal wall hernia or subcutaneous lesions. Musculoskeletal: No significant bony findings. IMPRESSION: 1. Mild stable distal esophageal wall thickening likely due to radiation change. No findings for recurrent tumor. Small paraesophageal lymph nodes are also stable. 2. No findings for metastatic disease. 3. Stable mild/early emphysematous changes and age advanced atherosclerotic calcifications involving the thoracic and abdominal aorta and branch vessels. Electronically Signed   By: Marijo Sanes M.D.   On: 01/18/2017 12:42   Dg Chest 2 View  Result Date: 04/16/2017 CLINICAL DATA:  Preoperative esophageal cancer. EXAM: CHEST  2 VIEW COMPARISON:  01/01/2017 FINDINGS: Power port type Infuse-A-Port with tip over the cavoatrial junction region. No pneumothorax. Normal heart size and pulmonary vascularity. No focal airspace disease or consolidation in the lungs. No blunting of costophrenic angles. No pneumothorax. Mediastinal contours appear intact. IMPRESSION: No active cardiopulmonary disease. Electronically Signed   By: Lucienne Capers M.D.   On: 04/16/2017 06:18   Ct Chest W Contrast  Result Date: 04/12/2017 CLINICAL DATA:  Restaging esophageal cancer.  Stage III. EXAM: CT CHEST AND ABDOMEN WITH CONTRAST TECHNIQUE: Multidetector CT imaging of the chest and abdomen was performed following the standard protocol during bolus administration of intravenous contrast. CONTRAST:  100 cc of Isovue-300 COMPARISON:  12/18/2016 FINDINGS: CT CHEST FINDINGS Cardiovascular: The heart size appears normal. There is aortic atherosclerosis. Calcification within the LAD, left circumflex and RCA coronary arteries identified. Mediastinum/Nodes: The trachea appears patent and is midline. Mild circumferential wall  thickening involving the distal esophagus is again identified, stable. Small mediastinal lymph nodes are again noted. Similar to the previous exam. No adenopathy. There is a left supraclavicular lymph node which measures 8 mm, image 3 of series 3. Previously 7 mm. No supraclavicular or axillary adenopathy. Lungs/Pleura: No pleural effusion identified. Mild diffuse bronchial wall thickening identified. Tiny nonspecific nodule within the medial right lower lobe measures 3 mm, image 57 of series 4. Not seen on previous exam. Musculoskeletal: No aggressive lytic or sclerotic bone lesions identified. CT ABDOMEN FINDINGS Hepatobiliary: No focal liver abnormality is seen. No gallstones, gallbladder wall thickening, or biliary dilatation. Pancreas: Unremarkable. No pancreatic ductal dilatation or surrounding inflammatory changes. Spleen: Normal in size without focal abnormality. Adrenals/Urinary Tract: The adrenal glands appear normal. Normal appearance of both kidneys. No mass or hydronephrosis identified. Stomach/Bowel: The stomach appears normal. No dilatation of the small or large bowel loops. Vascular/Lymphatic: Aortic atherosclerosis without aneurysm. No enlarged upper abdominal lymph nodes. Other: No abdominal wall hernia or abnormality. No abdominopelvic ascites. Musculoskeletal: No acute or significant osseous findings. IMPRESSION: 1. Mild stable distal esophageal wall thickening. No findings for recurrent tumor.  2. Stable small mediastinal and left supraclavicular lymph nodes. 3. There is a tiny nodule within the medial right lower lobe measuring 3 mm. New from previous exam. Nonspecific. Attention on follow-up imaging advise. 4.  Aortic Atherosclerosis (ICD10-I70.0). 5. Three vessel coronary artery calcification Electronically Signed   By: Kerby Moors M.D.   On: 04/12/2017 16:03   I have independently reviewed the above radiology studies  and reviewed the findings with the patient.    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 We'll plan on repeat CT of the chest abdomen pelvis for restaging since the last one done was almost 3 months ago.  plan to proceed with bronchoscopy transhiatal esophagectomy with cervical esophagogastrostomy and feeding jejunostomy on September 17   I had a detailed discussion with [name]  and his parents regarding the magnitude of the surgical esophagectomy procedure as well as the risks, the expected benefits, and alternatives.  I quoted Kiley G Rutigliano 5% perioperative mortality rate and a complication rate as high as 40%.  We specifically discussed complications, which include, but were not limited to: recurrent nerve injury with possible permanent hoarseness, anastomotic leak, airway and great vessel injury, conduit ischemia, thoracic duct leak, the inability to complete the operation via a transhiatal approach requiring a right thoracotomy,  bleeding, need for blood transfusion and the potential need for ventilator support.  Kohle KAYIN OSMENT has had questions answered is well informed and willing to proceed.    [name] MD      [address] [place] [address] Office [phone]   Beeper [phone]  04/16/2017 7:17 AM

## 2017-04-16 NOTE — H&P (View-Only) (Signed)
[name] [facility] Medical Record #[id number] Date of Birth: 1960-11-23  Referring: [name], MD Primary Care: [name], MD Oncology: Dr [name]  Chief Complaint:    Chief Complaint  Patient presents with  . Esophageal Cancer         History of Present Illness:    Darrell Cunningham 56 y.o. male has been followed in the office for esophageal cancer  , Surgical resection had been recommended to him in the past on his last visit he declined to proceed with surgery . He now returns to further discuss resection for esophageal cancer.     His symptoms began with increasing difficulty with pain with swallowing and with regurgitation. Because of these symptoms he underwent upper GI endoscopy by Dr. [name].  fungating mass extending up proximally 7 cm in the distal esophagus. CT scan suggests esophageal lymph nodes and a 2.3 cm mass. Biopsy were taken suggestive of adenocarcinoma but not diagnostic. Repeat biopsy was done 3/29 [id number] Diagnosis Esophagogastric junction, biopsy, mass - INVASIVE ADENOCARCINOMA. Microscopic Comment Dr. Lyndon Code has seen this case in consultation with agreement. The findings are called to Dr. Ardis Hughs on 10/27/2016. 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 He is currently a nonsmoker having quit 4 years ago prior to that he smoked 1-2 packs per day for 38 years. He denies any alcohol intake for the last 20 years.  Patient completed chemotherapy on May 8 and radiation on May 10  Wt Readings from Last 3 Encounters:  04/12/17 158 lb 9.6 oz (71.9 kg)  04/05/17 157 lb (71.2 kg)  03/14/17 159 lb 12.8 oz (72.5 kg)   Current Activity/ Functional Status:  Patient is independent with mobility/ambulation, transfers,  ADL's, IADL's.   Zubrod Score: At the time of surgery this patient's most appropriate activity status/level should be described as: []     0    Normal activity, no symptoms [x]     1    Restricted in physical strenuous activity but ambulatory, able to do out light work []     2    Ambulatory and capable of self care, unable to do work activities, up and about               >50 % of waking hours                              []     3    Only limited self care, in bed greater than 50% of waking hours []     4    Completely disabled, no self care, confined to bed or chair []     5    Moribund   Past Medical History:  Diagnosis Date  . Allergy   . Arthritis   . Asthma    as a child  . Cancer ([place])   . 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 Dysphagia   .       Marland Kitchen GERD (gastroesophageal reflux disease)   . Hyperlipidemia   .  Neuromuscular disorder ([place])   . Pneumonia 4 yrs ago    Past Surgical History:  Procedure Laterality Date  . EUS N/A 10/26/2016   Procedure: UPPER ENDOSCOPIC ULTRASOUND (EUS) LINEAR;  Surgeon: [name], MD;  Location: [facility];  Service: Endoscopy;  Laterality: N/A;  . EYE SURGERY Bilateral age 38    for cross eyes  . IR FLUORO GUIDE PORT INSERTION RIGHT  11/08/2016  . IR US GUIDE VASC ACCESS RIGHT  11/08/2016  . MOUTH SURGERY      Family History  Problem Relation Age of Onset  . Colon cancer Maternal Grandmother 79  . Breast cancer Maternal Grandmother 51  . Breast cancer Sister 69       Deceased at 37 of breast cancer  . Ovarian cancer Maternal Aunt   . Stomach cancer Neg Hx   . Esophageal cancer Neg Hx     Social History   Social History  . Marital status: Legally Separated    Spouse name: N/A  . Number of children: N/A  . Years of education: N/A   Occupational History  . Not on file.   Social History Main Topics  . Smoking status: Former Smoker    Packs/day: 1.00    Years: 38.00    Types: Cigarettes    Start date: 10/31/2012     Quit date: 10/31/2012  . Smokeless tobacco: Never Used  . Alcohol use No  . Drug use: No  . Sexual activity: Not on file   Other Topics Concern  . Not on file   Social History Narrative  . No narrative on file    History  Smoking Status  . Former Smoker  . Packs/day: 1.00  . Years: 38.00  . Types: Cigarettes  . Start date: 10/31/2012  . Quit date: 10/31/2012  Smokeless Tobacco  . Never Used    History  Alcohol Use No     Allergies  Allergen Reactions  . Penicillins Hives, Itching and Swelling    Has patient had a PCN reaction causing immediate rash, facial/tongue/throat swelling, SOB or lightheadedness with hypotension: No Has patient had a PCN reaction causing severe rash involving mucus membranes or skin necrosis: No Has patient had a PCN reaction that required hospitalization No Has patient had a PCN reaction occurring within the last 10 years: No If all of the above answers are "NO", then may proceed with Cephalosporin use. Unknown Childhood reaction    Current Facility-Administered Medications  Medication Dose Route Frequency Provider Last Rate Last Dose  . clindamycin (CLEOCIN) 900 MG/50ML IVPB           . clindamycin (CLEOCIN) IVPB 900 mg  900 mg Intravenous On Call to OR [name], MD       Facility-Administered Medications Ordered in Other Encounters  Medication Dose Route Frequency Provider Last Rate Last Dose  . fentaNYL (SUBLIMAZE) injection    Anesthesia Intra-op Everlean Cherry A, CRNA   50 mcg at 04/16/17 0710  . lactated ringers infusion    Continuous PRN Colin Benton, CRNA      . midazolam (VERSED) 5 MG/5ML injection    Anesthesia Intra-op Everlean Cherry A, CRNA   1 mg at 04/16/17 9562      Review of Systems:     Cardiac Review of Systems: Y or N  Chest Pain [ n   ]  Resting SOB [ n] Exertional SOB  [n  ]  Vertell Limber Florencio.Farrier ]   Pedal Edema Florencio.Farrier   ]  Palpitations [n  ] Syncope  Florencio.Farrier  ]   Presyncope [ n  ]  General Review of Systems: [Y] =  yes [  ]=no Constitional: recent weight change [  ];  Wt loss over the last 3 months [   ] anorexia [  ]; fatigue [  ]; nausea [  ]; night sweats [  ]; fever [  ]; or chills [  ];          Dental: poor dentition[  ]; Last Dentist visit:   Eye : blurred vision [  ]; diplopia [   ]; vision changes [  ];  Amaurosis fugax[  ]; Resp: cough [  ];  wheezing[  ];  hemoptysis[  ]; shortness of breath[  ]; paroxysmal nocturnal dyspnea[  ]; dyspnea on exertion[  ]; or orthopnea[  ];  GI:  gallstones[  ], vomiting[  ];  dysphagia[  ]; melena[  ];  hematochezia [  ]; heartburn[  ];   Hx of  Colonoscopy[  ]; GU: kidney stones [  ]; hematuria[  ];   dysuria [  ];  nocturia[  ];  history of     obstruction [  ]; urinary frequency [  ]             Skin: rash, swelling[  ];, hair loss[  ];  peripheral edema[  ];  or itching[  ]; Musculosketetal: myalgias[  ];  joint swelling[  ];  joint erythema[  ];  joint pain[  ];  back pain[  ];  Heme/Lymph: bruising[  ];  bleeding[  ];  anemia[  ];  Neuro: TIA[  ];  headaches[  ];  stroke[  ];  vertigo[  ];  seizures[  ];   paresthesias[  ];  difficulty walking[  ];  Psych:depression[  ]; anxiety[  ];  Endocrine: diabetes[  ];  thyroid dysfunction[  ];  Immunizations: Flu up to date [  ]; Pneumococcal up to date [  ];  Other:  Physical Exam: BP (!) 151/82   Pulse 98   Temp 98.2 F (36.8 C) (Oral)   Resp 20   SpO2 100%   PHYSICAL EXAMINATION: Physical Exam  Constitutional: He is oriented to person, place, and time. He appears well-developed and well-nourished. No distress.  HENT:  Mouth/Throat: No oropharyngeal exudate.  Eyes: Right eye exhibits no discharge. Left eye exhibits no discharge. No scleral icterus.  Neck: No JVD present. No tracheal deviation present. No thyromegaly present.  Cardiovascular: Normal rate and regular rhythm.  Exam reveals no gallop and no friction rub.   No murmur heard. Respiratory: No stridor. No respiratory distress. He has no  wheezes. He has no rales. He exhibits no tenderness.  GI: He exhibits no distension and no mass. There is no tenderness. There is no rebound and no guarding.  Musculoskeletal: He exhibits no edema, tenderness or deformity.  Lymphadenopathy:    He has no cervical adenopathy.  Neurological: He is alert and oriented to person, place, and time. No cranial nerve deficit. Coordination normal.  Psychiatric: He has a normal mood and affect. His behavior is normal. Judgment and thought content normal.      Diagnostic Studies & Laboratory data:   Diagnosis Esophagus, biopsy, distal esophageal tumor (33-39) - SUSPICIOUS FOR ADENOCARCINOMA, SEE COMMENT. Microscopic Comment The majority of fragments are reactive squamous mucosa with associated acute inflammation. There is a small fragment of atypical glandular epithelium with associated ulceration that is suspicious for  adenocarcinoma. There is no background Barrett's esophagus in the glandular component. Dr. Saralyn Pilar has reviewed the case. The case was called to Dr. [name] on 10/10/2016. [name] MD Pathologist, Electronic  - A large, fungating mass with no bleeding until contact was found in the lower third of the esophagus, 33 cm from the incisors and extending to the GE junction at 39 cm. The mass was partially obstructing and partially circumferential (involving one-half of the lumen circumference). Multiple biopsies were obtained with cold forceps for histology in a targeted manner. Findings: - The perviously described mass was also seen on retroflexion in the stomach at the gastroesophageal junction. - The exam of the stomach was otherwise normal. - The examined duodenum was normal.      EUS  Endoscopic Finding Findings: A medium-sized, fungating, very friable mass was found at the gastroesophageal junction. The 6cm long mass was partially obstructing and partially circumferential (involving two thirds of the  lumen circumference). The bulk of the mass lays in the distal esophagus but it does stradle the GE junction with about 2cm of tumor below the Z line. Biopsies were taken with a cold forceps for histology. Endosonographic Finding 1. The mass above correlated with a hypoechoic and heterogenous lesion at the gastroesophageal junction. This ranges from 33 cm from the incisors and extended to 39 cm. The lesion was partially circumferential (involving two-thirds of the lumen circumference). The endosonographic borders were poorly-defined. The mass measured up to 16 mm in thickness. There was sonographic evidence suggesting invasion into the adventitia (Layer 5, UT3). 2. There was one 1.3cm round, hypoechoic, heterogeneous lymphnode adjacent to the mass. This is presumed to be malignant based on EUS criteria (uN1). 3. Limited views of pancreas, spleen, liver were all normal.    Recent Radiology Findings:   Dg Chest 2 View  Result Date: 01/01/2017 CLINICAL DATA:  Congestion, cough and dyspnea x3 days. Esophageal carcinoma. Post chemotherapy and radiation. EXAM: CHEST  2 VIEW COMPARISON:  10/12/2016 CT chest. FINDINGS: The heart size and mediastinal contours are within normal limits. Right-sided port catheter tip in the cavoatrial junction. Both lungs are clear. No dilatation of the esophagus in this patient with known distal esophageal mass like abnormality on CT. The visualized skeletal structures are unremarkable. IMPRESSION: No active cardiopulmonary disease. Electronically Signed   By: Ashley Royalty M.D.   On: 01/01/2017 21:38   Result Date: 01/18/2017 CLINICAL DATA:  Restaging esophageal cancer. EXAM: CT CHEST, ABDOMEN, AND PELVIS WITH CONTRAST TECHNIQUE: Multidetector CT imaging of the chest, abdomen and pelvis was performed following the standard protocol during bolus administration of intravenous contrast. CONTRAST:  162mL ISOVUE-300 IOPAMIDOL (ISOVUE-300) INJECTION 61% COMPARISON:  12/14/2016  abdominal/ pelvic CT scan and PET-CT 10/24/2016 FINDINGS: CT CHEST FINDINGS Cardiovascular: The heart is normal in size. No pericardial effusion. Mild stable tortuosity and calcification of the thoracic aorta and branch vessels. No focal aneurysm or dissection. Stable age advanced three-vessel coronary artery calcifications. Right-sided Port-A-Cath in good position without complicating features. Mediastinum/Nodes: No mediastinal or hilar mass or lymphadenopathy. Mild wall stable distal esophageal wall thickening likely due to radiation. No obvious recurrent esophageal mass. Small paraesophageal lymph nodes. The largest measures 3.5 mm on image number 20. This is stable when compared to the prior PET-CT. Lungs/Pleura: Stable mild/early emphysematous changes. No acute pulmonary findings. No worrisome pulmonary nodules to suggest pulmonary metastatic disease. No pleural effusion or pleural nodules. Chest wall/ Musculoskeletal: Right-sided Port-A-Cath is stable. No breast masses, supraclavicular or axillary lymphadenopathy. The thyroid gland  appears normal. No significant bony findings. No evidence of osseous metastatic disease. CT ABDOMEN PELVIS FINDINGS Hepatobiliary: No focal hepatic lesions to suggest metastatic disease. The gallbladder is normal. No common bile duct dilatation. Pancreas: No mass, inflammation or ductal dilatation. Spleen: Normal size.  No focal lesions. Adrenals/Urinary Tract: The adrenal glands and kidneys are unremarkable and stable. No renal, ureteral or bladder calculi or mass. Stomach/Bowel: The stomach, duodenum, small bowel and colon are unremarkable. No inflammatory changes, mass lesions or obstructive findings. Vascular/Lymphatic: Stable age advanced atherosclerotic calcifications involving the aorta and iliac arteries. No focal aneurysm or dissection. The branch vessels are patent. The major venous structures are patent. No mesenteric or retroperitoneal mass or lymphadenopathy. Small  scattered lymph nodes are stable. Reproductive: The prostate gland and seminal vesicles are unremarkable. Other: No pelvic mass or adenopathy. No free pelvic fluid collections. No inguinal mass or adenopathy. No abdominal wall hernia or subcutaneous lesions. Musculoskeletal: No significant bony findings. IMPRESSION: 1. Mild stable distal esophageal wall thickening likely due to radiation change. No findings for recurrent tumor. Small paraesophageal lymph nodes are also stable. 2. No findings for metastatic disease. 3. Stable mild/early emphysematous changes and age advanced atherosclerotic calcifications involving the thoracic and abdominal aorta and branch vessels. Electronically Signed   By: Marijo Sanes M.D.   On: 01/18/2017 12:42   Dg Chest 2 View  Result Date: 04/16/2017 CLINICAL DATA:  Preoperative esophageal cancer. EXAM: CHEST  2 VIEW COMPARISON:  01/01/2017 FINDINGS: Power port type Infuse-A-Port with tip over the cavoatrial junction region. No pneumothorax. Normal heart size and pulmonary vascularity. No focal airspace disease or consolidation in the lungs. No blunting of costophrenic angles. No pneumothorax. Mediastinal contours appear intact. IMPRESSION: No active cardiopulmonary disease. Electronically Signed   By: Lucienne Capers M.D.   On: 04/16/2017 06:18   Ct Chest W Contrast  Result Date: 04/12/2017 CLINICAL DATA:  Restaging esophageal cancer.  Stage III. EXAM: CT CHEST AND ABDOMEN WITH CONTRAST TECHNIQUE: Multidetector CT imaging of the chest and abdomen was performed following the standard protocol during bolus administration of intravenous contrast. CONTRAST:  100 cc of Isovue-300 COMPARISON:  12/18/2016 FINDINGS: CT CHEST FINDINGS Cardiovascular: The heart size appears normal. There is aortic atherosclerosis. Calcification within the LAD, left circumflex and RCA coronary arteries identified. Mediastinum/Nodes: The trachea appears patent and is midline. Mild circumferential wall  thickening involving the distal esophagus is again identified, stable. Small mediastinal lymph nodes are again noted. Similar to the previous exam. No adenopathy. There is a left supraclavicular lymph node which measures 8 mm, image 3 of series 3. Previously 7 mm. No supraclavicular or axillary adenopathy. Lungs/Pleura: No pleural effusion identified. Mild diffuse bronchial wall thickening identified. Tiny nonspecific nodule within the medial right lower lobe measures 3 mm, image 57 of series 4. Not seen on previous exam. Musculoskeletal: No aggressive lytic or sclerotic bone lesions identified. CT ABDOMEN FINDINGS Hepatobiliary: No focal liver abnormality is seen. No gallstones, gallbladder wall thickening, or biliary dilatation. Pancreas: Unremarkable. No pancreatic ductal dilatation or surrounding inflammatory changes. Spleen: Normal in size without focal abnormality. Adrenals/Urinary Tract: The adrenal glands appear normal. Normal appearance of both kidneys. No mass or hydronephrosis identified. Stomach/Bowel: The stomach appears normal. No dilatation of the small or large bowel loops. Vascular/Lymphatic: Aortic atherosclerosis without aneurysm. No enlarged upper abdominal lymph nodes. Other: No abdominal wall hernia or abnormality. No abdominopelvic ascites. Musculoskeletal: No acute or significant osseous findings. IMPRESSION: 1. Mild stable distal esophageal wall thickening. No findings for recurrent tumor.  2. Stable small mediastinal and left supraclavicular lymph nodes. 3. There is a tiny nodule within the medial right lower lobe measuring 3 mm. New from previous exam. Nonspecific. Attention on follow-up imaging advise. 4.  Aortic Atherosclerosis (ICD10-I70.0). 5. Three vessel coronary artery calcification Electronically Signed   By: Kerby Moors M.D.   On: 04/12/2017 16:03   I have independently reviewed the above radiology studies  and reviewed the findings with the patient.    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 We'll plan on repeat CT of the chest abdomen pelvis for restaging since the last one done was almost 3 months ago.  plan to proceed with bronchoscopy transhiatal esophagectomy with cervical esophagogastrostomy and feeding jejunostomy on September 17   I had a detailed discussion with Darrell Cunningham  and his parents regarding the magnitude of the surgical esophagectomy procedure as well as the risks, the expected benefits, and alternatives.  I quoted Darrell Cunningham 5% perioperative mortality rate and a complication rate as high as 40%.  We specifically discussed complications, which include, but were not limited to: recurrent nerve injury with possible permanent hoarseness, anastomotic leak, airway and great vessel injury, conduit ischemia, thoracic duct leak, the inability to complete the operation via a transhiatal approach requiring a right thoracotomy,  bleeding, need for blood transfusion and the potential need for ventilator support.  Timm DAWAN FARNEY has had questions answered is well informed and willing to proceed.    [name] MD      Darrell Cunningham,Darrell Cunningham Office [phone]   Beeper [phone]  04/16/2017 7:17 AM

## 2017-04-16 NOTE — Brief Op Note (Addendum)
      [address]       [place] [address]             [phone]      04/16/2017  4:17 PM  PATIENT:  Darrell Cunningham  56 y.o. male  PRE-OPERATIVE DIAGNOSIS:  esophageal cancer  POST-OPERATIVE DIAGNOSIS:  esophageal cancer  PROCEDURE:  Procedure(s):  VIDEO BRONCHOSCOPY,  TRANS HIATAL TOTAL ESOPHAGECTOMY CERVICO ESOPHAGOGASTROMY PYLOROMYOTOMY FEEDING JEJUNOSTOMY  SURGEON:  Surgeon(s) and Role:    * [name], MD - Primary  PHYSICIAN ASSISTANT: Ellwood Handler PA-C  ANESTHESIA:   general  EBL:  Total I/O In: 4200 [I.V.:2700; IV Piggyback:1500] Out: 1760 [Urine:1060; Blood:700]  BLOOD ADMINISTERED:none  DRAINS: 28 Straight Left Chest Tube, Penrose Drain Left Neck, Feeding Jejunostomy   LOCAL MEDICATIONS USED:  NONE  SPECIMEN:  Source of Specimen:  Esophagus and GE Junction, Omentum  DISPOSITION OF SPECIMEN:  PATHOLOGY  COUNTS:  YES   DICTATION: .Dragon Dictation  PLAN OF CARE: Admit to inpatient   PATIENT DISPOSITION:  ICU - intubated and hemodynamically stable.   Delay start of Pharmacological VTE agent (>24hrs) due to surgical blood loss or risk of bleeding: yes

## 2017-04-16 NOTE — Progress Notes (Signed)
Patient ID: Darrell Cunningham, male   DOB: Feb 23, 1961, 56 y.o.   MRN: [id number] EVENING ROUNDS NOTE :     [address]       [place] [address]             [phone]                 Day of Surgery Procedure(s) (LRB): VIDEO BRONCHOSCOPY, Transhiatal Total Esophagectomy, Esophagogastrostomy, pyloromotomy, Feeding Jejunostomy (N/A) ESOPHAGECTOMY COMPLETE,Transhiatal total esophagectomy (N/A) JEJUNOSTOMY,Feeding (N/A)  Total Length of Stay:  LOS: 0 days  BP 107/69   Pulse 77   Temp (!) 97.4 F (36.3 C) (Axillary)   Resp 20   SpO2 100%   .Intake/Output      09/16 0701 - 09/17 0700 09/17 0701 - 09/18 0700   I.V.  3043.5   IV Piggyback  1600   Total Intake   4643.5   Urine  1190   Blood  700   Chest Tube  30   Total Output   1920   Net   +2723.5          . clindamycin (CLEOCIN) IV    . dexmedetomidine (PRECEDEX) IV infusion 0.5 mcg/kg/hr (04/16/17 1829)  . dextrose 5 % and 0.45 % NaCl with KCl 20 mEq/L 100 mL/hr at 04/16/17 1800  . potassium chloride 10 mEq (04/16/17 1826)     Lab Results  Component Value Date   WBC 8.8 04/16/2017   HGB 10.3 (L) 04/16/2017   HCT 30.9 (L) 04/16/2017   PLT 145 (L) 04/16/2017   GLUCOSE 179 (H) 04/16/2017   CHOL 157 10/27/2014   TRIG 83 10/27/2014   HDL 34 (L) 10/27/2014   LDLCALC 106 (H) 10/27/2014   ALT 37 04/12/2017   AST 36 04/12/2017   NA 134 (L) 04/16/2017   K 4.5 04/16/2017   CL 104 04/16/2017   CREATININE 0.83 04/16/2017   BUN 8 04/16/2017   CO2 22 04/16/2017   PSA 0.41 06/17/2014   INR 0.91 04/12/2017   HGBA1C 5.7 (H) 10/27/2014   Slowly waking up , still on vent  Vs stable  Chest xray ok Wean vent as tolerated   [name] MD  Beeper [phone] Office [phone] 04/16/2017 6:49 PM

## 2017-04-16 NOTE — Progress Notes (Signed)
04/16/2017 1620 ABG on arrival to SICU shown to Dr. [name], verbal order for 25 ml Sodium Bicarb to be administered. Orders enacted. Will continue to closely monitor patient.  Anagha Loseke, Arville Lime

## 2017-04-16 NOTE — Anesthesia Procedure Notes (Signed)
Procedure Name: Intubation Date/Time: 04/16/2017 7:55 AM Performed by: Everlean Cherry A Pre-anesthesia Checklist: Patient identified, Emergency Drugs available, Suction available and Patient being monitored Patient Re-evaluated:Patient Re-evaluated prior to induction Oxygen Delivery Method: Circle system utilized Preoxygenation: Pre-oxygenation with 100% oxygen Induction Type: IV induction Ventilation: Mask ventilation without difficulty Laryngoscope Size: Miller and 2 Grade View: Grade I Tube type: Reinforced (NIMS Tube) Tube size: 8.0 mm Number of attempts: 1 Airway Equipment and Method: Stylet Placement Confirmation: ETT inserted through vocal cords under direct vision,  positive ETCO2 and breath sounds checked- equal and bilateral ETT to lip (cm): At blue line in ETT. Tube secured with: Tape Dental Injury: Teeth and Oropharynx as per pre-operative assessment

## 2017-04-16 NOTE — Interval H&P Note (Signed)
History and Physical Interval Note:  04/16/2017 7:30 AM  Darrell Cunningham  has presented today for surgery, with the diagnosis of esophageal cancer  The various methods of treatment have been discussed with the patient and family. After consideration of risks, benefits and other options for treatment, the patient has consented to  Procedure(s): VIDEO BRONCHOSCOPY, Transhiatal Total Esophagectomy,Feeding Jejunostomy (N/A) ESOPHAGECTOMY COMPLETE,Transhiatal total esophagectomy (N/A) JEJUNOSTOMY,Feeding (N/A) as a surgical intervention .  The patient's history has been reviewed, patient examined, no change in status, stable for surgery.  I have reviewed the patient's chart and labs.  Questions were answered to the patient's satisfaction.     [name]

## 2017-04-17 ENCOUNTER — Inpatient Hospital Stay (HOSPITAL_COMMUNITY): Payer: Medicaid Other

## 2017-04-17 ENCOUNTER — Encounter (HOSPITAL_COMMUNITY): Payer: Self-pay | Admitting: Cardiothoracic Surgery

## 2017-04-17 LAB — BASIC METABOLIC PANEL
ANION GAP: 6 (ref 5–15)
BUN: 10 mg/dL (ref 6–20)
CALCIUM: 8.4 mg/dL — AB (ref 8.9–10.3)
CO2: 23 mmol/L (ref 22–32)
Chloride: 106 mmol/L (ref 101–111)
Creatinine, Ser: 0.71 mg/dL (ref 0.61–1.24)
GFR calc non Af Amer: >60 mL/min (ref >60)
GLUCOSE: 132 mg/dL — AB (ref 65–99)
POTASSIUM: 4.1 mmol/L (ref 3.5–5.1)
Sodium: 135 mmol/L (ref 135–145)

## 2017-04-17 LAB — CBC
HCT: 30.8 % — ABNORMAL LOW (ref 39.0–52.0)
HCT: 33.1 % — ABNORMAL LOW (ref 39.0–52.0)
HEMOGLOBIN: 10.2 g/dL — AB (ref 13.0–17.0)
Hemoglobin: 10.8 g/dL — ABNORMAL LOW (ref 13.0–17.0)
MCH: 29.6 pg (ref 26.0–34.0)
MCH: 29.8 pg (ref 26.0–34.0)
MCHC: 33.1 g/dL (ref 30.0–36.0)
MCHC: 32.6 g/dL (ref 30.0–36.0)
MCV: 89.3 fL (ref 78.0–100.0)
MCV: 91.4 fL (ref 78.0–100.0)
PLATELETS: 134 10*3/uL — AB (ref 150–400)
Platelets: 155 10*3/uL (ref 150–400)
RBC: 3.45 MIL/uL — AB (ref 4.22–5.81)
RBC: 3.62 MIL/uL — ABNORMAL LOW (ref 4.22–5.81)
RDW: 13.4 % (ref 11.5–15.5)
RDW: 13.8 % (ref 11.5–15.5)
WBC: 13 10*3/uL — ABNORMAL HIGH (ref 4.0–10.5)
WBC: 14.4 10*3/uL — ABNORMAL HIGH (ref 4.0–10.5)

## 2017-04-17 LAB — POCT I-STAT 3, ART BLOOD GAS (G3+)
Acid-base deficit: 1 mmol/L (ref 0.0–2.0)
Acid-base deficit: 4 mmol/L — ABNORMAL HIGH (ref 0.0–2.0)
Acid-base deficit: 1 mmol/L (ref 0.0–2.0)
Acid-base deficit: 5 mmol/L — ABNORMAL HIGH (ref 0.0–2.0)
Bicarbonate: 20.5 mmol/L (ref 20.0–28.0)
Bicarbonate: 22.8 mmol/L (ref 20.0–28.0)
Bicarbonate: 23.1 mmol/L (ref 20.0–28.0)
Bicarbonate: 20.9 mmol/L (ref 20.0–28.0)
O2 Saturation: 94 %
O2 Saturation: 99 %
O2 Saturation: 93 %
O2 Saturation: 97 %
Patient temperature: 97.8
Patient temperature: 99.2
Patient temperature: 99.1
Patient temperature: 97.7
TCO2: 21 mmol/L — ABNORMAL LOW (ref 22–32)
TCO2: 24 mmol/L (ref 22–32)
TCO2: 24 mmol/L (ref 22–32)
TCO2: 22 mmol/L (ref 22–32)
pCO2 arterial: 34 mmHg (ref 32.0–48.0)
pCO2 arterial: 35.2 mmHg (ref 32.0–48.0)
pCO2 arterial: 34.9 mmHg (ref 32.0–48.0)
pCO2 arterial: 41.6 mmHg (ref 32.0–48.0)
pH, Arterial: 7.374 (ref 7.350–7.450)
pH, Arterial: 7.433 (ref 7.350–7.450)
pH, Arterial: 7.43 (ref 7.350–7.450)
pH, Arterial: 7.307 — ABNORMAL LOW (ref 7.350–7.450)
pO2, Arterial: 112 mmHg — ABNORMAL HIGH (ref 83.0–108.0)
pO2, Arterial: 75 mmHg — ABNORMAL LOW (ref 83.0–108.0)
pO2, Arterial: 67 mmHg — ABNORMAL LOW (ref 83.0–108.0)
pO2, Arterial: 100 mmHg (ref 83.0–108.0)

## 2017-04-17 LAB — GLUCOSE, CAPILLARY
Glucose-Capillary: 119 mg/dL — ABNORMAL HIGH (ref 65–99)
Glucose-Capillary: 121 mg/dL — ABNORMAL HIGH (ref 65–99)
Glucose-Capillary: 121 mg/dL — ABNORMAL HIGH (ref 65–99)
Glucose-Capillary: 124 mg/dL — ABNORMAL HIGH (ref 65–99)
Glucose-Capillary: 141 mg/dL — ABNORMAL HIGH (ref 65–99)
Glucose-Capillary: 197 mg/dL — ABNORMAL HIGH (ref 65–99)

## 2017-04-17 LAB — CREATININE, SERUM
Creatinine, Ser: 0.81 mg/dL (ref 0.61–1.24)
GFR calc Af Amer: >60 mL/min (ref >60)
GFR calc non Af Amer: >60 mL/min (ref >60)

## 2017-04-17 MED ORDER — METOPROLOL TARTRATE 5 MG/5ML IV SOLN
2.5000 mg | Freq: Once | INTRAVENOUS | Status: AC
  Administered 2017-04-17: 5 mg via INTRAVENOUS
  Filled 2017-04-17: qty 5

## 2017-04-17 MED ORDER — FUROSEMIDE 10 MG/ML IJ SOLN
INTRAMUSCULAR | Status: AC
  Filled 2017-04-17: qty 4

## 2017-04-17 MED ORDER — LEVALBUTEROL HCL 0.63 MG/3ML IN NEBU
0.6300 mg | INHALATION_SOLUTION | Freq: Four times a day (QID) | RESPIRATORY_TRACT | Status: DC
  Administered 2017-04-17 (×3): 0.63 mg via RESPIRATORY_TRACT
  Filled 2017-04-17 (×3): qty 3

## 2017-04-17 MED ORDER — METOPROLOL TARTRATE 25 MG/10 ML ORAL SUSPENSION
12.5000 mg | Freq: Two times a day (BID) | ORAL | Status: DC
  Administered 2017-04-17 – 2017-04-24 (×15): 12.5 mg via JEJUNOSTOMY
  Filled 2017-04-17 (×3): qty 10
  Filled 2017-04-17: qty 5
  Filled 2017-04-17: qty 10
  Filled 2017-04-17 (×3): qty 5
  Filled 2017-04-17: qty 10
  Filled 2017-04-17 (×6): qty 5
  Filled 2017-04-17: qty 10
  Filled 2017-04-17: qty 5

## 2017-04-17 MED ORDER — ENOXAPARIN SODIUM 40 MG/0.4ML ~~LOC~~ SOLN
40.0000 mg | SUBCUTANEOUS | Status: DC
  Administered 2017-04-17 – 2017-04-28 (×12): 40 mg via SUBCUTANEOUS
  Filled 2017-04-17 (×11): qty 0.4

## 2017-04-17 MED ORDER — FUROSEMIDE 10 MG/ML IJ SOLN
40.0000 mg | Freq: Once | INTRAMUSCULAR | Status: AC
  Administered 2017-04-17: 40 mg via INTRAVENOUS

## 2017-04-17 MED ORDER — LEVALBUTEROL HCL 0.63 MG/3ML IN NEBU
0.6300 mg | INHALATION_SOLUTION | Freq: Three times a day (TID) | RESPIRATORY_TRACT | Status: DC
  Administered 2017-04-18 – 2017-04-21 (×10): 0.63 mg via RESPIRATORY_TRACT
  Filled 2017-04-17 (×10): qty 3

## 2017-04-17 MED ORDER — METOPROLOL TARTRATE 5 MG/5ML IV SOLN
10.0000 mg | INTRAVENOUS | Status: DC | PRN
  Administered 2017-04-17 – 2017-04-18 (×2): 5 mg via INTRAVENOUS
  Administered 2017-04-18: 10 mg via INTRAVENOUS
  Filled 2017-04-17 (×4): qty 10

## 2017-04-17 NOTE — Progress Notes (Signed)
Patient began rapid wean at 2315. At 2355 patient performed VC 8L and NIF -30. Patient extubated to [facility] [place]. Patient tolerated procedure well and able to vocalize name afterwards. RT will continue to monitor.

## 2017-04-17 NOTE — Progress Notes (Signed)
TCTS BRIEF SICU PROGRESS NOTE  1 Day Post-Op  S/P Procedure(s) (LRB): VIDEO BRONCHOSCOPY, Transhiatal Total Esophagectomy, Esophagogastrostomy, pyloromotomy, Feeding Jejunostomy (N/A) ESOPHAGECTOMY COMPLETE,Transhiatal total esophagectomy (N/A) JEJUNOSTOMY,Feeding (N/A)   Stable day Sinus tach w/ stable BP Breathing comfortably w/ O2 sats 97% on 6 L/min UOP excellent  Plan: Continue current plan  Rexene Alberts, MD 04/17/2017 6:16 PM

## 2017-04-17 NOTE — Progress Notes (Signed)
Patient ID: Darrell Cunningham, male   DOB: April 20, 1961, 56 y.o.   MRN: [id number] TCTS DAILY ICU PROGRESS NOTE                   [address]            [place] [address]          [phone]   1 Day Post-Op Procedure(s) (LRB): VIDEO BRONCHOSCOPY, Transhiatal Total Esophagectomy, Esophagogastrostomy, pyloromotomy, Feeding Jejunostomy (N/A) ESOPHAGECTOMY COMPLETE,Transhiatal total esophagectomy (N/A) JEJUNOSTOMY,Feeding (N/A)  Total Length of Stay:  LOS: 1 day   Subjective: Patient extubated last night, awake and alert, neuro intact, voice is good .  Objective: Vital signs in last 24 hours: Temp:  [97.2 F (36.2 C)-99.5 F (37.5 C)] 99.1 F (37.3 C) (09/18 0400) Pulse Rate:  [66-113] 83 (09/18 0600) Resp:  [17-27] 23 (09/18 0600) BP: (87-122)/(51-77) 97/58 (09/18 0600) SpO2:  [96 %-100 %] 96 % (09/18 0600) Arterial Line BP: (110-156)/(54-74) 127/64 (09/18 0600) FiO2 (%):  [40 %-50 %] 40 % (09/17 2337)  There were no vitals filed for this visit.  Weight change:    Hemodynamic parameters for last 24 hours:    Intake/Output from previous day: 09/17 0701 - 09/18 0700 In: 5564.4 [I.V.:3784.4; IV Piggyback:1750] Out: 2845 [Urine:1955; Blood:700; Chest Tube:190]  Intake/Output this shift: Total I/O In: 813.7 [I.V.:633.7; Other:30; IV Piggyback:150] Out: 900 [Urine:740; Chest Tube:160]  Current Meds: Scheduled Meds: . HYDROmorphone   Intravenous Q4H  . insulin aspart  0-24 Units Subcutaneous Q4H  . levalbuterol  0.63 mg Nebulization Q6H  . metoCLOPramide (REGLAN) injection  10 mg Intravenous Q6H  . pantoprazole (PROTONIX) IV  40 mg Intravenous Q12H   Continuous Infusions: . dexmedetomidine (PRECEDEX) IV infusion 0.2 mcg/kg/hr (04/17/17 0414)  . dextrose 5 % and 0.45 % NaCl with KCl 20 mEq/L 100 mL/hr at 04/16/17 2000  . potassium chloride Stopped (04/16/17 2324)   PRN Meds:.diphenhydrAMINE **OR** diphenhydrAMINE, fluticasone, midazolam, naloxone  **AND** sodium chloride flush, ondansetron (ZOFRAN) IV, potassium chloride  General appearance: alert, cooperative and mild distress Neurologic: intact Heart: regular rate and rhythm, S1, S2 normal, no murmur, click, rub or gallop Lungs: diminished breath sounds bibasilar Abdomen: soft, no bowel sounds  Extremities: extremities normal, atraumatic, no cyanosis or edema and varicose veins noted Wound: dressings intact  Lab Results: CBC: Recent Labs  04/16/17 1630 04/17/17 0335  WBC 8.8 13.0*  HGB 10.3* 10.2*  HCT 30.9* 30.8*  PLT 145* 134*   BMET:  Recent Labs  04/16/17 1630 04/17/17 0335  NA 134* 135  K 4.5 4.1  CL 104 106  CO2 22 23  GLUCOSE 179* 132*  BUN 8 10  CREATININE 0.83 0.71  CALCIUM 7.9* 8.4*    CMET: Lab Results  Component Value Date   WBC 13.0 (H) 04/17/2017   HGB 10.2 (L) 04/17/2017   HCT 30.8 (L) 04/17/2017   PLT 134 (L) 04/17/2017   GLUCOSE 132 (H) 04/17/2017   CHOL 157 10/27/2014   TRIG 83 10/27/2014   HDL 34 (L) 10/27/2014   LDLCALC 106 (H) 10/27/2014   ALT 37 04/12/2017   AST 36 04/12/2017   NA 135 04/17/2017   K 4.1 04/17/2017   CL 106 04/17/2017   CREATININE 0.71 04/17/2017   BUN 10 04/17/2017   CO2 23 04/17/2017   PSA 0.41 06/17/2014   INR 0.91 04/12/2017   HGBA1C 5.7 (H) 10/27/2014      PT/INR: No results for input(s): LABPROT, INR in  the last 72 hours. Radiology: Dg Chest Port 1 View  Result Date: 04/16/2017 CLINICAL DATA:  Status post total transhiatal esophagectomy EXAM: PORTABLE CHEST 1 VIEW COMPARISON:  04/16/2017 at 0511 hrs FINDINGS: Endotracheal tube terminates 3 cm above the carina. Right IJ chest port terminates in the right atrium. Right IJ venous catheter terminates in the distal SVC. Left chest tube.  No pneumothorax is seen. Enteric tube terminates below the diaphragm in this patient status post esophagectomy. The heart is normal in size. IMPRESSION: Endotracheal tube terminates 3 cm above the carina. Left chest tube.   No pneumothorax is seen. Additional support apparatus as above. Electronically Signed   By: Julian Hy M.D.   On: 04/16/2017 16:24     Assessment/Plan: S/P Procedure(s) (LRB): VIDEO BRONCHOSCOPY, Transhiatal Total Esophagectomy, Esophagogastrostomy, pyloromotomy, Feeding Jejunostomy (N/A) ESOPHAGECTOMY COMPLETE,Transhiatal total esophagectomy (N/A) JEJUNOSTOMY,Feeding (N/A) Mobilize Diuresis Continue foley due to strict I&O and urinary output monitoring See progression orders Expected Acute  Blood - loss Anemia   [name] 04/17/2017 6:56 AM

## 2017-04-18 ENCOUNTER — Inpatient Hospital Stay (HOSPITAL_COMMUNITY): Payer: Medicaid Other

## 2017-04-18 LAB — CBC
HCT: 31.9 % — ABNORMAL LOW (ref 39.0–52.0)
Hemoglobin: 10.2 g/dL — ABNORMAL LOW (ref 13.0–17.0)
MCH: 29.6 pg (ref 26.0–34.0)
MCHC: 32 g/dL (ref 30.0–36.0)
MCV: 92.5 fL (ref 78.0–100.0)
Platelets: 138 10*3/uL — ABNORMAL LOW (ref 150–400)
RBC: 3.45 MIL/uL — ABNORMAL LOW (ref 4.22–5.81)
RDW: 14 % (ref 11.5–15.5)
WBC: 13.9 10*3/uL — ABNORMAL HIGH (ref 4.0–10.5)

## 2017-04-18 LAB — GLUCOSE, CAPILLARY
Glucose-Capillary: 109 mg/dL — ABNORMAL HIGH (ref 65–99)
Glucose-Capillary: 113 mg/dL — ABNORMAL HIGH (ref 65–99)
Glucose-Capillary: 117 mg/dL — ABNORMAL HIGH (ref 65–99)
Glucose-Capillary: 118 mg/dL — ABNORMAL HIGH (ref 65–99)
Glucose-Capillary: 118 mg/dL — ABNORMAL HIGH (ref 65–99)
Glucose-Capillary: 118 mg/dL — ABNORMAL HIGH (ref 65–99)

## 2017-04-18 LAB — BASIC METABOLIC PANEL
Anion gap: 4 — ABNORMAL LOW (ref 5–15)
BUN: 10 mg/dL (ref 6–20)
CO2: 28 mmol/L (ref 22–32)
Calcium: 8.3 mg/dL — ABNORMAL LOW (ref 8.9–10.3)
Chloride: 101 mmol/L (ref 101–111)
Creatinine, Ser: 0.77 mg/dL (ref 0.61–1.24)
GFR calc Af Amer: >60 mL/min (ref >60)
GFR calc non Af Amer: >60 mL/min (ref >60)
Glucose, Bld: 123 mg/dL — ABNORMAL HIGH (ref 65–99)
Potassium: 4.6 mmol/L (ref 3.5–5.1)
Sodium: 133 mmol/L — ABNORMAL LOW (ref 135–145)

## 2017-04-18 MED ORDER — FUROSEMIDE 10 MG/ML IJ SOLN
40.0000 mg | Freq: Once | INTRAMUSCULAR | Status: AC
  Administered 2017-04-18: 40 mg via INTRAVENOUS
  Filled 2017-04-18: qty 4

## 2017-04-18 MED ORDER — CHLORHEXIDINE GLUCONATE CLOTH 2 % EX PADS
6.0000 | MEDICATED_PAD | Freq: Every day | CUTANEOUS | Status: DC
  Administered 2017-04-19 – 2017-04-25 (×6): 6 via TOPICAL

## 2017-04-18 MED ORDER — SODIUM CHLORIDE 0.9% FLUSH
10.0000 mL | INTRAVENOUS | Status: DC | PRN
  Administered 2017-04-24: 10 mL
  Filled 2017-04-18: qty 40

## 2017-04-18 MED ORDER — ACETYLCYSTEINE 20 % IN SOLN
4.0000 mL | Freq: Four times a day (QID) | RESPIRATORY_TRACT | Status: DC | PRN
  Administered 2017-04-18 – 2017-04-19 (×3): 4 mL via RESPIRATORY_TRACT
  Filled 2017-04-18 (×3): qty 4

## 2017-04-18 MED ORDER — VITAL AF 1.2 CAL PO LIQD
1000.0000 mL | ORAL | Status: DC
  Administered 2017-04-18: 1000 mL

## 2017-04-18 NOTE — Progress Notes (Addendum)
TCTS DAILY ICU PROGRESS NOTE                   [address]            [place] [address]          [phone]   2 Days Post-Op Procedure(s) (LRB): VIDEO BRONCHOSCOPY, Transhiatal Total Esophagectomy, Esophagogastrostomy, pyloromotomy, Feeding Jejunostomy (N/A) ESOPHAGECTOMY COMPLETE,Transhiatal total esophagectomy (N/A) JEJUNOSTOMY,Feeding (N/A)  Total Length of Stay:  LOS: 2 days   Subjective: Feels fairly well overall  Objective: Vital signs in last 24 hours: Temp:  [99.2 F (37.3 C)-101.1 F (38.4 C)] 100.6 F (38.1 C) (09/19 0900) Pulse Rate:  [106-148] 129 (09/19 0900) Cardiac Rhythm: Sinus tachycardia (09/19 0900) Resp:  [15-31] 23 (09/19 0900) BP: (83-152)/(60-118) 117/77 (09/19 0900) SpO2:  [23 %-100 %] 96 % (09/19 0900) Arterial Line BP: (25-155)/(18-101) 28/24 (09/19 0100)  There were no vitals filed for this visit.  Weight change:    Hemodynamic parameters for last 24 hours:    Intake/Output from previous day: 09/18 0701 - 09/19 0700 In: 1437.7 [I.V.:1377.7] Out: 1915 [Urine:1700; Chest Tube:215]  Intake/Output this shift: Total I/O In: 100 [I.V.:100] Out: 320 [Urine:250; Chest Tube:70]  Current Meds: Scheduled Meds: . enoxaparin (LOVENOX) injection  40 mg Subcutaneous Q24H  . furosemide  40 mg Intravenous Once  . HYDROmorphone   Intravenous Q4H  . insulin aspart  0-24 Units Subcutaneous Q4H  . levalbuterol  0.63 mg Nebulization TID  . metoprolol tartrate  12.5 mg Per J Tube BID  . pantoprazole (PROTONIX) IV  40 mg Intravenous Q12H   Continuous Infusions: . dexmedetomidine (PRECEDEX) IV infusion 0.2 mcg/kg/hr (04/17/17 0414)  . dextrose 5 % and 0.45 % NaCl with KCl 20 mEq/L 50 mL/hr at 04/17/17 1432  . potassium chloride Stopped (04/16/17 2324)   PRN Meds:.acetylcysteine, diphenhydrAMINE **OR** diphenhydrAMINE, fluticasone, metoprolol tartrate, midazolam, naloxone **AND** sodium chloride flush, ondansetron (ZOFRAN) IV,  potassium chloride  General appearance: alert, cooperative, no distress and anxious at times per nursing Heart: regular rate and rhythm and tachy Lungs: coarse ronchi throughout Abdomen: scant BS, + incisional tenderness Extremities: no edema Wound: dressings intact with some drainage  Lab Results: CBC: Recent Labs  04/17/17 1526 04/18/17 0416  WBC 14.4* 13.9*  HGB 10.8* 10.2*  HCT 33.1* 31.9*  PLT 155 138*   BMET:  Recent Labs  04/17/17 0335 04/17/17 1526 04/18/17 0416  NA 135  --  133*  K 4.1  --  4.6  CL 106  --  101  CO2 23  --  28  GLUCOSE 132*  --  123*  BUN 10  --  10  CREATININE 0.71 0.81 0.77  CALCIUM 8.4*  --  8.3*    CMET: Lab Results  Component Value Date   WBC 13.9 (H) 04/18/2017   HGB 10.2 (L) 04/18/2017   HCT 31.9 (L) 04/18/2017   PLT 138 (L) 04/18/2017   GLUCOSE 123 (H) 04/18/2017   CHOL 157 10/27/2014   TRIG 83 10/27/2014   HDL 34 (L) 10/27/2014   LDLCALC 106 (H) 10/27/2014   ALT 37 04/12/2017   AST 36 04/12/2017   NA 133 (L) 04/18/2017   K 4.6 04/18/2017   CL 101 04/18/2017   CREATININE 0.77 04/18/2017   BUN 10 04/18/2017   CO2 28 04/18/2017   PSA 0.41 06/17/2014   INR 0.91 04/12/2017   HGBA1C 5.7 (H) 10/27/2014      PT/INR: No results for input(s): LABPROT, INR  in the last 72 hours. Radiology: Dg Chest Port 1 View  Result Date: 04/18/2017 CLINICAL DATA:  Shortness of breath, esophagectomy. EXAM: PORTABLE CHEST 1 VIEW COMPARISON:  04/17/2017 and CT chest 04/12/2017. FINDINGS: Nasogastric tube terminates in the central upper abdomen, status post esophagectomy. Right IJ power port tip projects over the SVC RA junction. Right IJ central line tip terminates in the SVC. Left chest tube is at the base of the left hemithorax. Trachea is midline. Cardiac silhouette is mildly prominent, as before. Left lower lobe airspace opacification with a small left pleural effusion. No pneumothorax. There may be minimal atelectasis in the medial right  lower lobe. No edema. IMPRESSION: 1. Left lower lobe atelectasis, status post esophagectomy. 2. Small left pleural effusion. 3. Mild right lower lobe atelectasis. Electronically Signed   By: Lorin Picket M.D.   On: 04/18/2017 09:07     Assessment/Plan: S/P Procedure(s) (LRB): VIDEO BRONCHOSCOPY, Transhiatal Total Esophagectomy, Esophagogastrostomy, pyloromotomy, Feeding Jejunostomy (N/A) ESOPHAGECTOMY COMPLETE,Transhiatal total esophagectomy (N/A) JEJUNOSTOMY,Feeding (N/A)  1 stable but does have sinus tachy which is multifactorial, SBP variable from low to elevated- monitor. Lopressor started yesterday, may need further titration- follow for now 2 exp ABL anemia- continue to monitor 3 CBG's good control- cont SSI 4 minor hyponatremia- monitor, change IVF 5 start TF's slowly 6 pulm toilet- mucomyst and xopenex 7 SQ lovenox for DVT proph, mobilize as able 8 Cont PCA for now 9 path pending Darrell Cunningham 04/18/2017 9:15 AM    I have seen and examined Darrell Cunningham and agree with the above assessment  and plan.  [name] MD Beeper [phone] Office [phone] 04/18/2017 6:45 PM

## 2017-04-18 NOTE — Progress Notes (Signed)
Arterial line removed due to displacement. Line doesn't flush or draw back. Infection risk. Pressure applied to the radial site. No bleeding noted. Clean dressing applied. RN aware

## 2017-04-18 NOTE — Progress Notes (Signed)
Patient ID: Darrell Cunningham, male   DOB: 1960-12-04, 56 y.o.   MRN: [id number]  SICU Evening Rounds:  Temp 100.6 this am and low grade 99.6 all day.  Sounds junky and bringing up some sputum. Hemodynamically stable with resting sinus tach 118  sats 99% on RA  Good urine output  CT output low.  A/P: stable but need to work on IS, send sputum for GS and culture.

## 2017-04-18 NOTE — Progress Notes (Signed)
Initial Nutrition Assessment  DOCUMENTATION CODES:   Obesity unspecified  INTERVENTION:   Vital AF 1.2 @ 20 ml/hr (480 ml/day) Provides: 576 kcal, 36 grams protein, and 389 ml free water.   As tolerated recommend advancing Vital AF 1.2 to goal rate of 60 ml/hr  1440 ml/day, 1728 kcal, 108 grams protein, and 1167 ml free water.   NUTRITION DIAGNOSIS:   Inadequate oral intake related to altered GI function as evidenced by NPO status.  GOAL:   Patient will meet greater than or equal to 90% of their needs  MONITOR:   Diet advancement, TF tolerance, I & O's  REASON FOR ASSESSMENT:   Consult Enteral/tube feeding initiation and management  ASSESSMENT:   Pt with PMH of dysphagia, GERD, and esophageal cancer s/p chemo/XRT now admitted for surgery. Pt is s/p total esophagectomy, esophagogastrostomy, and feeding jejunostomy tube placement.    Pt sitting up in chair. Chart reviewed. Pt confirms that he lost weight while receiving chemo and XRT for esophageal cancer in 11/2016 but he has regained all of the weight he lost. Lowest weight recorded was 129 lb 12/14/16. He is now back up to 158 lb. Pt reports that he is careful when eating but does not usually have any problems.   Consult received to begin J tube feedings.  Discussed with patient and RN. Will start at 20 ml/hr  Medications reviewed Labs reviewed: Na 133 (L)  Nutrition-Focused physical exam completed. Findings are no fat depletion, no muscle depletion, and no edema.     Diet Order:  Diet NPO time specified  Skin:   (abd/neck incision)  Last BM:  PTA  Height:   Ht Readings from Last 1 Encounters:  04/12/17 5\' 1"  (1.549 m)    Weight:   Wt Readings from Last 1 Encounters:  04/12/17 158 lb 9.6 oz (71.9 kg)    Ideal Body Weight:  50.9 kg  BMI:  30  Estimated Nutritional Needs:   Kcal:  1650-1850  Protein:  95-115 grams  Fluid:  > 1.7 L/day  EDUCATION NEEDS:   Education needs addressed  Maylon Peppers RD, [place], [place] Pager [phone] After Hours Pager

## 2017-04-18 NOTE — Progress Notes (Signed)
30ml's of hydromorphone wasted in sink from PCA syringe with Jake Bathe as second RN to verify. Will continue to monitor.

## 2017-04-19 ENCOUNTER — Inpatient Hospital Stay (HOSPITAL_COMMUNITY): Payer: Medicaid Other

## 2017-04-19 LAB — GLUCOSE, CAPILLARY
Glucose-Capillary: 114 mg/dL — ABNORMAL HIGH (ref 65–99)
Glucose-Capillary: 115 mg/dL — ABNORMAL HIGH (ref 65–99)
Glucose-Capillary: 116 mg/dL — ABNORMAL HIGH (ref 65–99)
Glucose-Capillary: 122 mg/dL — ABNORMAL HIGH (ref 65–99)
Glucose-Capillary: 133 mg/dL — ABNORMAL HIGH (ref 65–99)
Glucose-Capillary: 95 mg/dL (ref 65–99)

## 2017-04-19 LAB — COMPREHENSIVE METABOLIC PANEL
ALT: 64 U/L — ABNORMAL HIGH (ref 17–63)
AST: 48 U/L — ABNORMAL HIGH (ref 15–41)
Albumin: 3.1 g/dL — ABNORMAL LOW (ref 3.5–5.0)
Alkaline Phosphatase: 69 U/L (ref 38–126)
Anion gap: 9 (ref 5–15)
BUN: 13 mg/dL (ref 6–20)
CO2: 26 mmol/L (ref 22–32)
Calcium: 8.5 mg/dL — ABNORMAL LOW (ref 8.9–10.3)
Chloride: 98 mmol/L — ABNORMAL LOW (ref 101–111)
Creatinine, Ser: 0.56 mg/dL — ABNORMAL LOW (ref 0.61–1.24)
GFR calc Af Amer: >60 mL/min (ref >60)
GFR calc non Af Amer: >60 mL/min (ref >60)
Glucose, Bld: 117 mg/dL — ABNORMAL HIGH (ref 65–99)
Potassium: 3.8 mmol/L (ref 3.5–5.1)
Sodium: 133 mmol/L — ABNORMAL LOW (ref 135–145)
Total Bilirubin: 2.5 mg/dL — ABNORMAL HIGH (ref 0.3–1.2)
Total Protein: 6.5 g/dL (ref 6.5–8.1)

## 2017-04-19 LAB — EXPECTORATED SPUTUM ASSESSMENT W GRAM STAIN, RFLX TO RESP C: Special Requests: NORMAL

## 2017-04-19 LAB — CBC
HCT: 30.1 % — ABNORMAL LOW (ref 39.0–52.0)
Hemoglobin: 9.7 g/dL — ABNORMAL LOW (ref 13.0–17.0)
MCH: 29.5 pg (ref 26.0–34.0)
MCHC: 32.2 g/dL (ref 30.0–36.0)
MCV: 91.5 fL (ref 78.0–100.0)
Platelets: 151 10*3/uL (ref 150–400)
RBC: 3.29 MIL/uL — ABNORMAL LOW (ref 4.22–5.81)
RDW: 13.7 % (ref 11.5–15.5)
WBC: 10.4 10*3/uL (ref 4.0–10.5)

## 2017-04-19 MED ORDER — ORAL CARE MOUTH RINSE
15.0000 mL | Freq: Two times a day (BID) | OROMUCOSAL | Status: DC
  Administered 2017-04-19 – 2017-04-29 (×15): 15 mL via OROMUCOSAL

## 2017-04-19 MED ORDER — VITAL AF 1.2 CAL PO LIQD: 1000.0000 mL | ORAL | Status: DC

## 2017-04-19 NOTE — Progress Notes (Signed)
Patient ID: Darrell Cunningham, male   DOB: 07/29/1961, 56 y.o.   MRN: [id number] TCTS DAILY ICU PROGRESS NOTE                   [address]            [place] [address]          [phone]   3 Days Post-Op Procedure(s) (LRB): VIDEO BRONCHOSCOPY, Transhiatal Total Esophagectomy, Esophagogastrostomy, pyloromotomy, Feeding Jejunostomy (N/A) ESOPHAGECTOMY COMPLETE,Transhiatal total esophagectomy (N/A) JEJUNOSTOMY,Feeding (N/A)  Total Length of Stay:  LOS: 3 days   Subjective: Up to chair, respiratory effort and clearing secretions improved   Objective: Vital signs in last 24 hours: Temp:  [99.3 F (37.4 C)-100.6 F (38.1 C)] 100 F (37.8 C) (09/20 0005) Pulse Rate:  [91-129] 118 (09/20 0700) Cardiac Rhythm: Sinus tachycardia (09/19 2000) Resp:  [16-26] 26 (09/20 0700) BP: (105-125)/(47-98) 108/78 (09/20 0700) SpO2:  [23 %-100 %] 95 % (09/20 0700)  There were no vitals filed for this visit.  Weight change:   Wt Readings from Last 3 Encounters:  04/12/17 158 lb 9.6 oz (71.9 kg)  04/05/17 157 lb (71.2 kg)  03/14/17 159 lb 12.8 oz (72.5 kg)     Hemodynamic parameters for last 24 hours:    Intake/Output from previous day: 09/19 0701 - 09/20 0700 In: 321.7 [I.V.:100; NG/GT:161.7] Out: 2570 [Urine:2100; Emesis/NG output:300; Chest Tube:170]  Intake/Output this shift: No intake/output data recorded.  Current Meds: Scheduled Meds: . Chlorhexidine Gluconate Cloth  6 each Topical Daily  . enoxaparin (LOVENOX) injection  40 mg Subcutaneous Q24H  . HYDROmorphone   Intravenous Q4H  . insulin aspart  0-24 Units Subcutaneous Q4H  . levalbuterol  0.63 mg Nebulization TID  . metoprolol tartrate  12.5 mg Per J Tube BID  . pantoprazole (PROTONIX) IV  40 mg Intravenous Q12H   Continuous Infusions: . dexmedetomidine (PRECEDEX) IV infusion 0.2 mcg/kg/hr (04/17/17 0414)  . feeding supplement (VITAL AF 1.2 CAL) 1,000 mL (04/18/17 2255)  . potassium chloride  Stopped (04/16/17 2324)   PRN Meds:.acetylcysteine, diphenhydrAMINE **OR** diphenhydrAMINE, fluticasone, metoprolol tartrate, midazolam, naloxone **AND** sodium chloride flush, ondansetron (ZOFRAN) IV, potassium chloride, sodium chloride flush  General appearance: alert, cooperative and no distress Neurologic: intact Heart: regular rate and rhythm, S1, S2 normal, no murmur, click, rub or gallop Lungs: rhonchi bilaterally Abdomen: no bowel sounds yet  Extremities: extremities normal, atraumatic, no cyanosis or edema and Homans sign is negative, no sign of DVT Wound: neck incision intact , no drainage from neck penrose   Lab Results: CBC: Recent Labs  04/18/17 0416 04/19/17 0358  WBC 13.9* 10.4  HGB 10.2* 9.7*  HCT 31.9* 30.1*  PLT 138* 151   BMET:  Recent Labs  04/18/17 0416 04/19/17 0358  NA 133* 133*  K 4.6 3.8  CL 101 98*  CO2 28 26  GLUCOSE 123* 117*  BUN 10 13  CREATININE 0.77 0.56*  CALCIUM 8.3* 8.5*    CMET: Lab Results  Component Value Date   WBC 10.4 04/19/2017   HGB 9.7 (L) 04/19/2017   HCT 30.1 (L) 04/19/2017   PLT 151 04/19/2017   GLUCOSE 117 (H) 04/19/2017   CHOL 157 10/27/2014   TRIG 83 10/27/2014   HDL 34 (L) 10/27/2014   LDLCALC 106 (H) 10/27/2014   ALT 64 (H) 04/19/2017   AST 48 (H) 04/19/2017   NA 133 (L) 04/19/2017   K 3.8 04/19/2017   CL 98 (L) 04/19/2017  CREATININE 0.56 (L) 04/19/2017   BUN 13 04/19/2017   CO2 26 04/19/2017   PSA 0.41 06/17/2014   INR 0.91 04/12/2017   HGBA1C 5.7 (H) 10/27/2014      PT/INR: No results for input(s): LABPROT, INR in the last 72 hours. Radiology: No results found.   Assessment/Plan: S/P Procedure(s) (LRB): VIDEO BRONCHOSCOPY, Transhiatal Total Esophagectomy, Esophagogastrostomy, pyloromotomy, Feeding Jejunostomy (N/A) ESOPHAGECTOMY COMPLETE,Transhiatal total esophagectomy (N/A) JEJUNOSTOMY,Feeding (N/A) Mobilize Diuresis D/c chest tube  Wbc decreasing Slowly advance tube feeding  Keep  npo  Continue pulmonary toilet ,    [name] 04/19/2017 8:14 AM

## 2017-04-19 NOTE — Progress Notes (Signed)
Pt NGT coiled in mouth.  Dr. Roxan Hockey at bedside.  Orders to pull back ngt enough to get slack out of mouth.  Orders initiated, pt tolerated well.  Will continue to monitor pt.

## 2017-04-19 NOTE — Progress Notes (Signed)
      [address]       [place] [address]             [phone]      POD # 3 esophagectomy  NG is coiled in mouth  BP 99/62   Pulse 97   Temp 99.4 F (37.4 C) (Oral)   Resp 17   SpO2 98%    Intake/Output Summary (Last 24 hours) at 04/19/17 1938 Last data filed at 04/19/17 1600  Gross per 24 hour  Intake           326.67 ml  Output             1010 ml  Net          -683.33 ml    Will pull nG back enough to get slack out of mouth  Aleane Wesenberg C. Roxan Hockey, MD [facility] [phone]'

## 2017-04-20 ENCOUNTER — Inpatient Hospital Stay (HOSPITAL_COMMUNITY): Payer: Medicaid Other

## 2017-04-20 LAB — GLUCOSE, CAPILLARY
Glucose-Capillary: 106 mg/dL — ABNORMAL HIGH (ref 65–99)
Glucose-Capillary: 108 mg/dL — ABNORMAL HIGH (ref 65–99)
Glucose-Capillary: 112 mg/dL — ABNORMAL HIGH (ref 65–99)
Glucose-Capillary: 113 mg/dL — ABNORMAL HIGH (ref 65–99)
Glucose-Capillary: 115 mg/dL — ABNORMAL HIGH (ref 65–99)
Glucose-Capillary: 120 mg/dL — ABNORMAL HIGH (ref 65–99)
Glucose-Capillary: 97 mg/dL (ref 65–99)

## 2017-04-20 LAB — BASIC METABOLIC PANEL
Anion gap: 7 (ref 5–15)
BUN: 13 mg/dL (ref 6–20)
CO2: 28 mmol/L (ref 22–32)
Calcium: 8.3 mg/dL — ABNORMAL LOW (ref 8.9–10.3)
Chloride: 101 mmol/L (ref 101–111)
Creatinine, Ser: 0.63 mg/dL (ref 0.61–1.24)
GFR calc Af Amer: >60 mL/min (ref >60)
GFR calc non Af Amer: >60 mL/min (ref >60)
Glucose, Bld: 122 mg/dL — ABNORMAL HIGH (ref 65–99)
Potassium: 3.4 mmol/L — ABNORMAL LOW (ref 3.5–5.1)
Sodium: 136 mmol/L (ref 135–145)

## 2017-04-20 LAB — CBC
HCT: 27.7 % — ABNORMAL LOW (ref 39.0–52.0)
Hemoglobin: 9.1 g/dL — ABNORMAL LOW (ref 13.0–17.0)
MCH: 30 pg (ref 26.0–34.0)
MCHC: 32.9 g/dL (ref 30.0–36.0)
MCV: 91.4 fL (ref 78.0–100.0)
Platelets: 182 10*3/uL (ref 150–400)
RBC: 3.03 MIL/uL — ABNORMAL LOW (ref 4.22–5.81)
RDW: 14.2 % (ref 11.5–15.5)
WBC: 7.1 10*3/uL (ref 4.0–10.5)

## 2017-04-20 MED ORDER — VITAL AF 1.2 CAL PO LIQD
1000.0000 mL | ORAL | Status: DC
Start: 2017-04-20 — End: 2017-04-23
  Administered 2017-04-20: 1000 mL
  Filled 2017-04-20 (×4): qty 1000

## 2017-04-20 MED ORDER — POTASSIUM CHLORIDE 10 MEQ/100ML IV SOLN
10.0000 meq | Freq: Every day | INTRAVENOUS | Status: DC | PRN
  Administered 2017-04-20 (×3): 10 meq via INTRAVENOUS
  Filled 2017-04-20 (×3): qty 100

## 2017-04-20 NOTE — Care Management Note (Signed)
Case Management Note  Patient Details  Name: Darrell Cunningham MRN: [id number] Date of Birth: 12-12-1960  Subjective/Objective:    Pt admitted with esophageal CA - s/p               VATS and Jejunostomy tube placement  Action/Plan:  PTA independent from home with mom - mom will be support person at discharge.  Pt will likely need HHRN, DME and tube feeds at discharge with new J Tube - CM will continue to follow for discharge needs.  [facility] confirmed they are able to provide all the above needs to the pt if pt so chooses them for HH/DME.  CM will continue to follow for discharge needs   Expected Discharge Date:                  Expected Discharge Plan:  Home/Self Care  In-House Referral:     Discharge planning Services  CM Consult  Post Acute Care Choice:    Choice offered to:     DME Arranged:    DME Agency:     HH Arranged:    HH Agency:     Status of Service:     If discussed at [employer] of Stay Meetings, dates discussed:    Additional Comments:  Maryclare Labrador, RN 04/20/2017, 9:51 AM

## 2017-04-20 NOTE — Progress Notes (Addendum)
Patient ID: Darrell Cunningham, male   DOB: Jan 19, 1961, 56 y.o.   MRN: [id number] TCTS DAILY ICU PROGRESS NOTE                   Darrell [address]            [place] [address]          [phone]   4 Days Post-Op Procedure(s) (LRB): VIDEO BRONCHOSCOPY, Transhiatal Total Esophagectomy, Esophagogastrostomy, pyloromotomy, Feeding Jejunostomy (N/A) ESOPHAGECTOMY COMPLETE,Transhiatal total esophagectomy (N/A) JEJUNOSTOMY,Feeding (N/A)  Total Length of Stay:  LOS: 4 days   Subjective: Patient walked this am better then yesterday , still needs respiratory rx but improving   Objective: Vital signs in last 24 hours: Temp:  [98.4 F (36.9 C)-100.4 F (38 C)] 99.3 F (37.4 C) (09/21 0407) Pulse Rate:  [92-117] 117 (09/21 0700) Cardiac Rhythm: Normal sinus rhythm;Sinus tachycardia (09/21 0400) Resp:  [15-28] 19 (09/21 0700) BP: (95-128)/(57-89) 111/68 (09/21 0700) SpO2:  [92 %-100 %] 100 % (09/21 0753) Weight:  [153 lb 3.5 oz (69.5 kg)] 153 lb 3.5 oz (69.5 kg) (09/21 0500)  Filed Weights   04/20/17 0500  Weight: 153 lb 3.5 oz (69.5 kg)    Weight change:    Hemodynamic parameters for last 24 hours:    Intake/Output from previous day: 09/20 0701 - 09/21 0700 In: 915 [NG/GT:705] Out: 1010 [Urine:850; Emesis/NG output:150; Chest Tube:10]  Intake/Output this shift: No intake/output data recorded.  Current Meds: Scheduled Meds: . Chlorhexidine Gluconate Cloth  6 each Topical Daily  . enoxaparin (LOVENOX) injection  40 mg Subcutaneous Q24H  . HYDROmorphone   Intravenous Q4H  . insulin aspart  0-24 Units Subcutaneous Q4H  . levalbuterol  0.63 mg Nebulization TID  . mouth rinse  15 mL Mouth Rinse BID  . metoprolol tartrate  12.5 mg Per J Tube BID  . pantoprazole (PROTONIX) IV  40 mg Intravenous Q12H   Continuous Infusions: . dexmedetomidine (PRECEDEX) IV infusion 0.2 mcg/kg/hr (04/17/17 0414)  . feeding supplement (VITAL AF 1.2 CAL) 1,000 mL (04/20/17 0700)  .  potassium chloride Stopped (04/16/17 2324)   PRN Meds:.acetylcysteine, diphenhydrAMINE **OR** diphenhydrAMINE, fluticasone, metoprolol tartrate, midazolam, naloxone **AND** sodium chloride flush, ondansetron (ZOFRAN) IV, potassium chloride, sodium chloride flush  General appearance: alert and cooperative Neurologic: intact Heart: regular rate and rhythm, S1, S2 normal, no murmur, click, rub or gallop Lungs: diminished breath sounds bibasilar Abdomen: not distended, hypoactive bowel sounds  Extremities: extremities normal, atraumatic, no cyanosis or edema and Homans sign is negative, no sign of DVT Wound: no drainage from neck penrose, neck and addominal inceistions intact  Lab Results: CBC: Recent Labs  04/19/17 0358 04/20/17 0440  WBC 10.4 7.1  HGB 9.7* 9.1*  HCT 30.1* 27.7*  PLT 151 182   BMET:  Recent Labs  04/19/17 0358 04/20/17 0440  NA 133* 136  K 3.8 3.4*  CL 98* 101  CO2 26 28  GLUCOSE 117* 122*  BUN 13 13  CREATININE 0.56* 0.63  CALCIUM 8.5* 8.3*    CMET: Lab Results  Component Value Date   WBC 7.1 04/20/2017   HGB 9.1 (L) 04/20/2017   HCT 27.7 (L) 04/20/2017   PLT 182 04/20/2017   GLUCOSE 122 (H) 04/20/2017   CHOL 157 10/27/2014   TRIG 83 10/27/2014   HDL 34 (L) 10/27/2014   LDLCALC 106 (H) 10/27/2014   ALT 64 (H) 04/19/2017   AST 48 (H) 04/19/2017   NA 136 04/20/2017   K 3.4 (  L) 04/20/2017   CL 101 04/20/2017   CREATININE 0.63 04/20/2017   BUN 13 04/20/2017   CO2 28 04/20/2017   PSA 0.41 06/17/2014   INR 0.91 04/12/2017   HGBA1C 5.7 (H) 10/27/2014      PT/INR: No results for input(s): LABPROT, INR in the last 72 hours. Radiology: No results found.   Assessment/Plan: S/P Procedure(s) (LRB): VIDEO BRONCHOSCOPY, Transhiatal Total Esophagectomy, Esophagogastrostomy, pyloromotomy, Feeding Jejunostomy (N/A) ESOPHAGECTOMY COMPLETE,Transhiatal total esophagectomy (N/A) JEJUNOSTOMY,Feeding (N/A) Mobilize Increase tube feeding to 60 ml hour  over next 1-2 days as tolerated  D/c ng tube and keep npo, plan Gastrogaffin swallow Monday, 7 days post op  Wbc decreased to 7000 D/c central line, has port if needed  Path still pending   [name] 04/20/2017 8:06 AM

## 2017-04-20 NOTE — Progress Notes (Signed)
TCTS BRIEF SICU PROGRESS NOTE  4 Days Post-Op  S/P Procedure(s) (LRB): VIDEO BRONCHOSCOPY, Transhiatal Total Esophagectomy, Esophagogastrostomy, pyloromotomy, Feeding Jejunostomy (N/A) ESOPHAGECTOMY COMPLETE,Transhiatal total esophagectomy (N/A) JEJUNOSTOMY,Feeding (N/A)   Stable day  Plan: Continue current plan  Rexene Alberts, MD 04/20/2017 8:56 PM

## 2017-04-21 ENCOUNTER — Inpatient Hospital Stay (HOSPITAL_COMMUNITY): Payer: Medicaid Other

## 2017-04-21 LAB — BASIC METABOLIC PANEL
Anion gap: 7 (ref 5–15)
BUN: 16 mg/dL (ref 6–20)
CO2: 27 mmol/L (ref 22–32)
Calcium: 8.7 mg/dL — ABNORMAL LOW (ref 8.9–10.3)
Chloride: 105 mmol/L (ref 101–111)
Creatinine, Ser: 0.58 mg/dL — ABNORMAL LOW (ref 0.61–1.24)
GFR calc Af Amer: >60 mL/min (ref >60)
GFR calc non Af Amer: >60 mL/min (ref >60)
Glucose, Bld: 110 mg/dL — ABNORMAL HIGH (ref 65–99)
Potassium: 3.6 mmol/L (ref 3.5–5.1)
Sodium: 139 mmol/L (ref 135–145)

## 2017-04-21 LAB — GLUCOSE, CAPILLARY
Glucose-Capillary: 107 mg/dL — ABNORMAL HIGH (ref 65–99)
Glucose-Capillary: 118 mg/dL — ABNORMAL HIGH (ref 65–99)
Glucose-Capillary: 113 mg/dL — ABNORMAL HIGH (ref 65–99)
Glucose-Capillary: 104 mg/dL — ABNORMAL HIGH (ref 65–99)
Glucose-Capillary: 104 mg/dL — ABNORMAL HIGH (ref 65–99)

## 2017-04-21 LAB — CULTURE, RESPIRATORY W GRAM STAIN
Culture: NORMAL
Special Requests: NORMAL

## 2017-04-21 LAB — CBC
HCT: 31.8 % — ABNORMAL LOW (ref 39.0–52.0)
Hemoglobin: 10.1 g/dL — ABNORMAL LOW (ref 13.0–17.0)
MCH: 29.3 pg (ref 26.0–34.0)
MCHC: 31.8 g/dL (ref 30.0–36.0)
MCV: 92.2 fL (ref 78.0–100.0)
Platelets: 173 10*3/uL (ref 150–400)
RBC: 3.45 MIL/uL — ABNORMAL LOW (ref 4.22–5.81)
RDW: 14.1 % (ref 11.5–15.5)
WBC: 6.8 10*3/uL (ref 4.0–10.5)

## 2017-04-21 MED ORDER — INFLUENZA VAC SPLIT QUAD 0.5 ML IM SUSY
0.5000 mL | PREFILLED_SYRINGE | INTRAMUSCULAR | Status: AC
  Administered 2017-04-25: 0.5 mL via INTRAMUSCULAR
  Filled 2017-04-21: qty 0.5

## 2017-04-21 MED ORDER — LEVALBUTEROL HCL 0.63 MG/3ML IN NEBU
0.6300 mg | INHALATION_SOLUTION | Freq: Two times a day (BID) | RESPIRATORY_TRACT | Status: DC
  Administered 2017-04-21 – 2017-04-23 (×4): 0.63 mg via RESPIRATORY_TRACT
  Filled 2017-04-21 (×4): qty 3

## 2017-04-21 MED ORDER — PANTOPRAZOLE SODIUM 40 MG PO PACK
40.0000 mg | PACK | Freq: Two times a day (BID) | ORAL | Status: DC
  Administered 2017-04-21 – 2017-04-29 (×17): 40 mg
  Filled 2017-04-21 (×17): qty 20

## 2017-04-21 NOTE — Progress Notes (Addendum)
[facility] DAILY ICU PROGRESS NOTE                   [address]            [place] [address]          [phone]   5 Days Post-Op Procedure(s) (LRB): VIDEO BRONCHOSCOPY, Transhiatal Total Esophagectomy, Esophagogastrostomy, pyloromotomy, Feeding Jejunostomy (N/A) ESOPHAGECTOMY COMPLETE,Transhiatal total esophagectomy (N/A) JEJUNOSTOMY,Feeding (N/A)  Total Length of Stay:  LOS: 5 days   Subjective: Some incisional discomfort but overall feeling better, tol TF's without nausea   Objective: Vital signs in last 24 hours: Temp:  [98.6 F (37 C)-100.4 F (38 C)] 100.4 F (38 C) (09/22 0838) Pulse Rate:  [41-114] 106 (09/22 0800) Cardiac Rhythm: Sinus tachycardia (09/22 0400) Resp:  [16-25] 18 (09/22 0916) BP: (92-138)/(59-93) 120/82 (09/22 0800) SpO2:  [83 %-100 %] 94 % (09/22 0916) FiO2 (%):  [34 %] 34 % (09/21 1200) Weight:  [154 lb 8.7 oz (70.1 kg)] 154 lb 8.7 oz (70.1 kg) (09/22 0600)  Filed Weights   04/20/17 0500 04/21/17 0600  Weight: 153 lb 3.5 oz (69.5 kg) 154 lb 8.7 oz (70.1 kg)    Weight change: 1 lb 5.2 oz (0.6 kg)   Hemodynamic parameters for last 24 hours:    Intake/Output from previous day: 09/21 0701 - 09/22 0700 In: 1093.7 [NG/GT:863.7; IV Piggyback:200] Out: 475 [Urine:425; Emesis/NG output:50]  Intake/Output this shift: No intake/output data recorded.  Current Meds: Scheduled Meds: . Chlorhexidine Gluconate Cloth  6 each Topical Daily  . enoxaparin (LOVENOX) injection  40 mg Subcutaneous Q24H  . HYDROmorphone   Intravenous Q4H  . insulin aspart  0-24 Units Subcutaneous Q4H  . levalbuterol  0.63 mg Nebulization TID  . mouth rinse  15 mL Mouth Rinse BID  . metoprolol tartrate  12.5 mg Per J Tube BID  . pantoprazole (PROTONIX) IV  40 mg Intravenous Q12H   Continuous Infusions: . dexmedetomidine (PRECEDEX) IV infusion 0.2 mcg/kg/hr (04/17/17 0414)  . feeding supplement (VITAL AF 1.2 CAL) 1,000 mL (04/21/17 0500)  . potassium  chloride 10 mEq (04/20/17 1721)   PRN Meds:.acetylcysteine, diphenhydrAMINE **OR** diphenhydrAMINE, fluticasone, metoprolol tartrate, midazolam, naloxone **AND** sodium chloride flush, ondansetron (ZOFRAN) IV, potassium chloride, sodium chloride flush  General appearance: alert, cooperative and no distress Heart: regular rate and rhythm Lungs: coarse, dim in left lower fields Abdomen: mild distension, + BS, some incisional tenderness Extremities: trace edema Wound: incis healing well  Lab Results: CBC: Recent Labs  04/20/17 0440 04/21/17 0456  WBC 7.1 6.8  HGB 9.1* 10.1*  HCT 27.7* 31.8*  PLT 182 173   BMET:  Recent Labs  04/20/17 0440 04/21/17 0456  NA 136 139  K 3.4* 3.6  CL 101 105  CO2 28 27  GLUCOSE 122* 110*  BUN 13 16  CREATININE 0.63 0.58*  CALCIUM 8.3* 8.7*    CMET: Lab Results  Component Value Date   WBC 6.8 04/21/2017   HGB 10.1 (L) 04/21/2017   HCT 31.8 (L) 04/21/2017   PLT 173 04/21/2017   GLUCOSE 110 (H) 04/21/2017   CHOL 157 10/27/2014   TRIG 83 10/27/2014   HDL 34 (L) 10/27/2014   LDLCALC 106 (H) 10/27/2014   ALT 64 (H) 04/19/2017   AST 48 (H) 04/19/2017   NA 139 04/21/2017   K 3.6 04/21/2017   CL 105 04/21/2017   CREATININE 0.58 (L) 04/21/2017   BUN 16 04/21/2017   CO2 27 04/21/2017   PSA  0.41 06/17/2014   INR 0.91 04/12/2017   HGBA1C 5.7 (H) 10/27/2014      PT/INR: No results for input(s): LABPROT, INR in the last 72 hours. Radiology: Dg Chest Port 1 View  Result Date: 04/21/2017 CLINICAL DATA:  Status post esophagectomy EXAM: PORTABLE CHEST 1 VIEW COMPARISON:  04/20/2017 FINDINGS: Previously seen nasogastric catheter and right jugular catheter have been removed in the interval. Right chest wall port remains in place. Very minimal pleural fluid is seen. No focal infiltrate is noted. No pneumothorax is seen. IMPRESSION: Minimal pleural fluid.  Interval removal of tubes and lines. Electronically Signed   By: Inez Catalina M.D.   On:  04/21/2017 08:04     Assessment/Plan: S/P Procedure(s) (LRB): VIDEO BRONCHOSCOPY, Transhiatal Total Esophagectomy, Esophagogastrostomy, pyloromotomy, Feeding Jejunostomy (N/A) ESOPHAGECTOMY COMPLETE,Transhiatal total esophagectomy (N/A) JEJUNOSTOMY,Feeding (N/A)  1 conts to progress well 2 Path  1. Omentum, resection for tumor - BENIGN ADIPOSE TISSUE CONSISTENT WITH OMENTUM. - NO EVIDENCE OF MALIGNANCY. 2. Esophagus, resection, w/ GE junction - FIBROSIS WITH PATCHY CHRONIC INFLAMMATION. - NO RESIDUAL CARCINOMA IDENTIFIED. - MARGINS NOT INVOLVED. - TWELVE LYMPH NODES WITH NO METASTATIC CARCINOMA (0/12). 3 H/H improved 4 electrolytes, renal fxn ok 5 CXR is stable with small effusion 6 CBG's adeq control, no recent Hg A1c, was 5.7 in 2016  7 tol TF's cont NPO till swallow on Monday  GOLD,[name] 04/21/2017 9:36 AM   I have seen and examined the patient and agree with the assessment and plan as outlined.  Transfer stepdown  Rexene Alberts, MD 04/21/2017 11:36 AM

## 2017-04-22 LAB — GLUCOSE, CAPILLARY
Glucose-Capillary: 102 mg/dL — ABNORMAL HIGH (ref 65–99)
Glucose-Capillary: 120 mg/dL — ABNORMAL HIGH (ref 65–99)
Glucose-Capillary: 121 mg/dL — ABNORMAL HIGH (ref 65–99)
Glucose-Capillary: 123 mg/dL — ABNORMAL HIGH (ref 65–99)
Glucose-Capillary: 127 mg/dL — ABNORMAL HIGH (ref 65–99)

## 2017-04-22 MED ORDER — SODIUM CHLORIDE 0.9 % IV SOLN
INTRAVENOUS | Status: DC
  Administered 2017-04-22 – 2017-04-24 (×2): via INTRAVENOUS

## 2017-04-22 NOTE — Progress Notes (Signed)
Patient refused to ambulate, daughter at bedside encouraging pt to ambulate. Rational for ambulation explained to pt,

## 2017-04-22 NOTE — Progress Notes (Addendum)
Left neck dressing change done , no drainage noted, PR drain in place. but dressing was loose

## 2017-04-22 NOTE — Progress Notes (Signed)
Pt transferred to 4E27 via wheelchair accompanied by RN. Transferred to chair independently. Tolerated well. No s/s of distress at this time. Fola RN at bedside to receive pt.

## 2017-04-22 NOTE — Plan of Care (Signed)
Problem: Skin Integrity: Goal: Risk for impaired skin integrity will decrease Outcome: Progressing Verbalized understanding  Problem: Tissue Perfusion: Goal: Risk factors for ineffective tissue perfusion will decrease Outcome: Progressing Verbalized understanding  Problem: Activity: Goal: Risk for activity intolerance will decrease Outcome: Progressing Verbalized understanding

## 2017-04-22 NOTE — Progress Notes (Signed)
      [address]       [place] [address]             [phone]        CARDIOTHORACIC SURGERY PROGRESS NOTE   R6 Days Post-Op Procedure(s) (LRB): VIDEO BRONCHOSCOPY, Transhiatal Total Esophagectomy, Esophagogastrostomy, pyloromotomy, Feeding Jejunostomy (N/A) ESOPHAGECTOMY COMPLETE,Transhiatal total esophagectomy (N/A) JEJUNOSTOMY,Feeding (N/A)  Subjective: No complaints.  No BM since surgery but abdomen non-distended and + flatus  Objective: Vital signs: BP Readings from Last 1 Encounters:  04/22/17 97/71   Pulse Readings from Last 1 Encounters:  04/22/17 (!) 106   Resp Readings from Last 1 Encounters:  04/22/17 (!) 22   Temp Readings from Last 1 Encounters:  04/22/17 99.3 F (37.4 C) (Oral)    Hemodynamics:    Physical Exam:  Rhythm:   sinus  Breath sounds: clear  Heart sounds:  RRR  Incisions:  Clean and dry  Abdomen:  Soft, non-distended, non-tender  Extremities:  Warm, well-perfused    Intake/Output from previous day: 09/22 0701 - 09/23 0700 In: 950 [NG/GT:880] Out: 820 [Urine:820] Intake/Output this shift: No intake/output data recorded.  Lab Results:  CBC: Recent Labs  04/20/17 0440 04/21/17 0456  WBC 7.1 6.8  HGB 9.1* 10.1*  HCT 27.7* 31.8*  PLT 182 173    BMET:  Recent Labs  04/20/17 0440 04/21/17 0456  NA 136 139  K 3.4* 3.6  CL 101 105  CO2 28 27  GLUCOSE 122* 110*  BUN 13 16  CREATININE 0.63 0.58*  CALCIUM 8.3* 8.7*     PT/INR:  No results for input(s): LABPROT, INR in the last 72 hours.  CBG (last 3)   Recent Labs  04/21/17 2356 04/22/17 0432 04/22/17 0829  GLUCAP 123* 120* 127*    ABG    Component Value Date/Time   PHART 7.430 04/17/2017 0350   PCO2ART 34.9 04/17/2017 0350   PO2ART 67.0 (L) 04/17/2017 0350   HCO3 23.1 04/17/2017 0350   TCO2 24 04/17/2017 0350   ACIDBASEDEF 1.0 04/17/2017 0350   O2SAT 93.0 04/17/2017 0350    CXR: n/a  Assessment/Plan: S/P Procedure(s)  (LRB): VIDEO BRONCHOSCOPY, Transhiatal Total Esophagectomy, Esophagogastrostomy, pyloromotomy, Feeding Jejunostomy (N/A) ESOPHAGECTOMY COMPLETE,Transhiatal total esophagectomy (N/A) JEJUNOSTOMY,Feeding (N/A)  Overall doing well POD6 Mobilize Tube feeds Gastrograffin swallow eval tomorrow Awaiting bed on 4E for transfer  Rexene Alberts, MD 04/22/2017 10:07 AM

## 2017-04-22 NOTE — Plan of Care (Addendum)
Problem: Tissue Perfusion: Goal: Risk factors for ineffective tissue perfusion will decrease Outcome: Progressing Verbalized understanding

## 2017-04-22 NOTE — Progress Notes (Signed)
Patient arrived on the unit via a wheelchair from 2H,placed on tele ccmd notified, assessment completed see flowsheet, patient oriented to room and staff, bed in lowest position, call bell within reach will continue to monitor.

## 2017-04-23 ENCOUNTER — Inpatient Hospital Stay (HOSPITAL_COMMUNITY): Payer: Medicaid Other

## 2017-04-23 LAB — GLUCOSE, CAPILLARY
Glucose-Capillary: 125 mg/dL — ABNORMAL HIGH (ref 65–99)
Glucose-Capillary: 125 mg/dL — ABNORMAL HIGH (ref 65–99)
Glucose-Capillary: 130 mg/dL — ABNORMAL HIGH (ref 65–99)
Glucose-Capillary: 131 mg/dL — ABNORMAL HIGH (ref 65–99)
Glucose-Capillary: 137 mg/dL — ABNORMAL HIGH (ref 65–99)

## 2017-04-23 LAB — COMPREHENSIVE METABOLIC PANEL
ALT: 57 U/L (ref 17–63)
ANION GAP: 6 (ref 5–15)
AST: 32 U/L (ref 15–41)
Albumin: 2.8 g/dL — ABNORMAL LOW (ref 3.5–5.0)
Alkaline Phosphatase: 104 U/L (ref 38–126)
BUN: 17 mg/dL (ref 6–20)
CO2: 28 mmol/L (ref 22–32)
CREATININE: 0.58 mg/dL — AB (ref 0.61–1.24)
Calcium: 8.8 mg/dL — ABNORMAL LOW (ref 8.9–10.3)
Chloride: 106 mmol/L (ref 101–111)
Glucose, Bld: 115 mg/dL — ABNORMAL HIGH (ref 65–99)
POTASSIUM: 4 mmol/L (ref 3.5–5.1)
SODIUM: 140 mmol/L (ref 135–145)
Total Bilirubin: 0.6 mg/dL (ref 0.3–1.2)
Total Protein: 6.2 g/dL — ABNORMAL LOW (ref 6.5–8.1)

## 2017-04-23 LAB — CBC
HCT: 31.2 % — ABNORMAL LOW (ref 39.0–52.0)
Hemoglobin: 9.8 g/dL — ABNORMAL LOW (ref 13.0–17.0)
MCH: 29.1 pg (ref 26.0–34.0)
MCHC: 31.4 g/dL (ref 30.0–36.0)
MCV: 92.6 fL (ref 78.0–100.0)
PLATELETS: 247 10*3/uL (ref 150–400)
RBC: 3.37 MIL/uL — ABNORMAL LOW (ref 4.22–5.81)
RDW: 14.4 % (ref 11.5–15.5)
WBC: 10 10*3/uL (ref 4.0–10.5)

## 2017-04-23 LAB — PREALBUMIN: PREALBUMIN: 8.5 mg/dL — AB (ref 18–38)

## 2017-04-23 MED ORDER — SODIUM CHLORIDE 0.9% FLUSH
10.0000 mL | INTRAVENOUS | Status: DC | PRN
  Administered 2017-04-26 – 2017-04-29 (×3): 10 mL
  Filled 2017-04-23 (×3): qty 40

## 2017-04-23 MED ORDER — VITAL AF 1.2 CAL PO LIQD
1000.0000 mL | ORAL | Status: DC
  Administered 2017-04-24 – 2017-04-28 (×5): 1000 mL
  Filled 2017-04-23 (×13): qty 1000

## 2017-04-23 MED ORDER — IOPAMIDOL (ISOVUE-300) INJECTION 61%
INTRAVENOUS | Status: AC
  Administered 2017-04-23: 100 mL via ORAL
  Filled 2017-04-23: qty 150

## 2017-04-23 MED ORDER — LEVALBUTEROL HCL 0.63 MG/3ML IN NEBU
0.6300 mg | INHALATION_SOLUTION | Freq: Three times a day (TID) | RESPIRATORY_TRACT | Status: DC
  Administered 2017-04-23 – 2017-04-29 (×16): 0.63 mg via RESPIRATORY_TRACT
  Filled 2017-04-23 (×19): qty 3

## 2017-04-23 MED ORDER — LEVALBUTEROL HCL 0.63 MG/3ML IN NEBU
0.6300 mg | INHALATION_SOLUTION | Freq: Four times a day (QID) | RESPIRATORY_TRACT | Status: DC | PRN
  Administered 2017-04-26: 0.63 mg via RESPIRATORY_TRACT

## 2017-04-23 MED ORDER — IOPAMIDOL (ISOVUE-300) INJECTION 61%
150.0000 mL | Freq: Once | INTRAVENOUS | Status: AC | PRN
  Administered 2017-04-23: 100 mL via ORAL

## 2017-04-23 NOTE — Progress Notes (Signed)
After 2 unsuccessful IV attempts the patient stated he had a port a cath and ask if we could access. Advised the nurse and the patient that we could use his port. He states it's time for it to be flushed anyway. [employer]

## 2017-04-23 NOTE — Op Note (Signed)
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  PROCEDURES PERFORMED:  Bronchoscopy, transhiatal total esophagectomy with cervical esophagogastrostomy, pyloromyotomy, and feeding jejunostomy tube.  SURGEON:  [name], MD.  FIRST ASSISTANT:  Ellwood Handler, PA.  BRIEF HISTORY:  The patient is a 56 year old male who in the spring of 2018 was diagnosed with 6 cm T3 lesion of the distal esophagus.  Biopsy showing to be adenocarcinoma.  The patient underwent a course of chemotherapy and radiation therapy with consideration for esophagectomy. At the completion, the patient's tentative surgery had been scheduled, planned for mid July.  In June, the patient had developed pneumonia and was briefly hospitalized on his return.  He was reluctant to proceed with surgery and declined it.  We will continue to follow him, and after counseling both with myself and Dr Burr Medico, he decided he would proceed with surgery.  He was seen again in late August rescanning CT scan of the chest, abdomen, pelvis.  Chest and abdomen was performed without any evidence of distant metastatic disease.  Risks and options had been discussed with the patient on multiple occasions and he was willing to proceed.  DESCRIPTION OF PROCEDURE:  With central line and arterial line in place, the patient underwent general endotracheal anesthesia with a NIMS tube. Appropriate time-out was performed and then we proceeded with fiberoptic bronchoscopy through the endotracheal tube.  The tube was in  good position.  There was no evidence of tracheal narrowing or compression. The scope was removed.  The left neck, chest and abdomen was then prepped with Betadine and draped in sterile manner.  Appropriate time- out was again performed.  We proceeded with upper abdominal midline incision, carried down into the abdominal cavity.  The abdominal cavity was entered.  Careful survey of the abdomen including palpation of the liver revealed no evidence of spread of malignancy that would preclude surgical resection.  We began by mobilizing the greater curve of the stomach, dividing the short gastric vessels with the Lig X.  We extended across the anterior right and left crura in the esophagus.  With the greater curve freed, we dissected around the esophageal hiatus.  There was no direct extension of tumor into the curve or the surrounding tissue outside the esophagus.  Penrose drain was placed around the esophagus at the GE junction.  We then taking care to preserve the right gastroepiploic artery, entered into the lesser sac and dissected out and encircled the coronary vein and left gastric artery with vessel loop.  A bulldog was placed on the left gastric artery.  With this clamp, there was still an easily palpable pulse along the right gastroepiploic artery.  There were adhesions along to the gallbladder.  The gallbladder  itself did not appear to be chronically inflamed and was normal in appearance.  A Kocher maneuver was performed to free and mobilize the duodenum.  We then proceeded with transhiatal esophagectomy, proceeding with blunt dissection and also direct vision with Harmonic scalpel dividing tissue along the esophagus from the abdomen.    We then moved to the left neck.  A small incision was made along the left sternocleidomastoid muscle and dissection was carried down through the platysma and the strap muscles.  The jugular vein and carotid artery were retracted laterally and  dissection was carried down to the prevertebral fascia.  In the neck, we had allowed the patient's muscle relaxant to wear off and used the NIMS nerve stimulator.  With this and with careful dissection along with esophageal tracheal groove, we were able to comfortably identify the recurrent nerve to avoid any injury to the nerve.  No metal retractors were placed into the depths of the wound.  Once identifying the recurrent nerve, we dissected around the cervical esophagus and placed a Penrose around the esophagus.  We then proceeded with continued dissection from below and above freeing up the esophagus.  With the esophagus easily movable in the chest, we then retracted the previously placed NG tube into the cervical esophagus, divided the distal cervical esophagus with a GIA stapler.  The specimen was then delivered into the abdominal cavity. Using a vascular stapler, the coronary vein and left gastric artery were divided, mobilizing the stomach.  We then with a 37 GIA stapler created a gastric tube based on the right gastroepiploic artery with a 3 to 4 cm margin from the GE junction.  With the specimen removed, it was submitted to Pathology for complete frozen section along the gastric margin.  Along the stapled gastric tube, a running 3-0 Prolene suture inverting the staple line was performed.  We then turned our attention to the pylorus where pyloromyotomy was created with a needle tip Bovie.  The pyloric muscle was divided.  The mucosa was never opened.  The area was then closed horizontally with interrupted silk sutures.  We then used a Foley catheter with a large balloon, passed through the posterior mediastinum and with the stomach and a sterile telescope camera bag and taking care not to twist the gastric tube was delivered to the cervical incision.  At this time, we had frozen section of the stomach margin reported by pathology to be negative for any tumor and proceeded with  the cervical anastomosis.  The cervical esophagus was tacked to the gastric tube and away from the tip of the gastric tube a centimeter to a centimeter and a half, the stomach was opened.  The mucosa of the stomach and esophagus were approximated with interrupted 4-0 Vicryl sutures using the stapler with 1 arm in the stomach and 1 in the esophagus.  The posterior lateral walls of the anastomosis were created.  Anterior wall of the anastomosis after placing the NG tube across the anastomosis and into the upper abdomen was closed with interrupted 4-0 Vicryl sutures in the mucosal layer and interrupted 4-0 silk sutures in the muscular layer.  The anastomosis was positioned into the posterior neck.  At the completion of the anastomosis, the stomach appeared viable with a Penrose drain was placed into the depths of the wound in the vicinity of the anastomosis and brought out through a separate site in the left neck.  The neck incision was irrigated and closed with interrupted 3-0 Vicryl sutures and  4-0 subcuticular stitch in the skin edges.  We then turned our attention back to the abdominal incision.  Although, we did not definitely enter the left chest that we are aware of.  We did place a left chest tube.  The stomach was tacked to the diaphragmatic hiatus to prevent herniation of bowel contents into the posterior mediastinum.  The proximal jejunum and ligament of Treitz were identified.  An 18-French red rubber catheter with multiple extra side- holes cut was then whittled into the small bowel, and brought out through the left lateral mid abdominal wall and secured in place.  The small bowel was tacked to the inner abdominal wall to prevent torquing or herniation.  With the sponge and needle count reported as correct, we then proceeded with closing the abdominal fascia with #2 Prolene sutures.  Subcutaneous tissue was closed with a running 2-0 Vicryl and 3- 0 subcuticular stitch in the  skin edges.  Dry dressings were applied. Estimated blood loss was 600 to 700 mL.  Sponge and needle count was reported as correct at the completion of the procedure.  The patient tolerated the procedure without obvious complication.  No blood products were administered during the operative procedure.  Prior to closing the neck incision and before the patient was re-paralyzed for closure of the abdomen, the NIMS stimulator confirmed that the left recurrent nerve was still intact.  The patient was then transferred to the Surgical Intensive Care Unit for postoperative care, still intubated on the Precedex drip, to be weaned and extubated as tolerated.  __________     [name], MD     EG/MEDQ  D:  04/20/2017  T:  04/20/2017  Job:  [id number]

## 2017-04-23 NOTE — Progress Notes (Addendum)
      [address]       [place] [address]             [phone]        7 Days Post-Op Procedure(s) (LRB): VIDEO BRONCHOSCOPY, Transhiatal Total Esophagectomy, Esophagogastrostomy, pyloromotomy, Feeding Jejunostomy (N/A) ESOPHAGECTOMY COMPLETE,Transhiatal total esophagectomy (N/A) JEJUNOSTOMY,Feeding (N/A)  Subjective: Patient with sputum production.  Objective: Vital signs in last 24 hours: Temp:  [98.7 F (37.1 C)-99.6 F (37.6 C)] 99.1 F (37.3 C) (09/24 0720) Pulse Rate:  [88-120] 88 (09/24 0720) Cardiac Rhythm: Sinus tachycardia (09/23 2200) Resp:  [16-24] 24 (09/24 0734) BP: (97-123)/(57-81) 120/70 (09/24 0720) SpO2:  [92 %-96 %] 95 % (09/24 0734) FiO2 (%):  [96 %] 96 % (09/23 2000) Weight:  [154 lb 8.7 oz (70.1 kg)] 154 lb 8.7 oz (70.1 kg) (09/23 1550)  Current Weight  04/22/17 154 lb 8.7 oz (70.1 kg)    Intake/Output from previous day: 09/23 0701 - 09/24 0700 In: 1220.8 [I.V.:300.8; NG/GT:920] Out: 850 [Urine:850]   Physical Exam:  Cardiovascular: RRR Pulmonary: Rhonchi bilaterally Abdomen: Soft, non tender, bowel sounds present. Extremities: Trace bilateral lower extremity edema. Wounds: Clean and dry.  No erythema or signs of infection.  Lab Results: CBC: Recent Labs  04/21/17 0456 04/23/17 0253  WBC 6.8 10.0  HGB 10.1* 9.8*  HCT 31.8* 31.2*  PLT 173 247   BMET:  Recent Labs  04/21/17 0456 04/23/17 0253  NA 139 140  K 3.6 4.0  CL 105 106  CO2 27 28  GLUCOSE 110* 115*  BUN 16 17  CREATININE 0.58* 0.58*  CALCIUM 8.7* 8.8*    PT/INR:  Lab Results  Component Value Date   INR 0.91 04/12/2017   INR 0.98 01/01/2017   INR 0.97 11/08/2016   ABG:  INR: Will add last result for INR, ABG once components are confirmed Will add last 4 CBG results once components are confirmed  Assessment/Plan:  1. CV - SR in the 80's. On Lopressor 12.5 mg via J tube. 2.  Pulmonary - On room air. CXR ordered for today but not  taken yet. Encourage incentive spirometer. 3. GI-tolerating TFs so will be increased, IVF to be decreased, to remain NPO for now. Swallow study for today. 4. Anemia-H and H stable at 9.8 and 31.2  ZIMMERMAN,DONIELLE MPA-C 04/23/2017,7:48 AM  No drainage from neck drain, swallow today with follow up kub to check emptying of stomach , on intial study this am no leak at neck I have seen and examined Darrell Cunningham and agree with the above assessment  and plan.  [name] MD Beeper [phone] Office [phone] 04/23/2017 2:45 PM  Follow up kub shows contrast in colon, will start patient on diet slowly  [name] MD      [address] , [address] Office [phone]   [place]

## 2017-04-24 LAB — GLUCOSE, CAPILLARY
Glucose-Capillary: 120 mg/dL — ABNORMAL HIGH (ref 65–99)
Glucose-Capillary: 130 mg/dL — ABNORMAL HIGH (ref 65–99)

## 2017-04-24 LAB — CREATININE, SERUM
Creatinine, Ser: 0.59 mg/dL — ABNORMAL LOW (ref 0.61–1.24)
GFR calc Af Amer: >60 mL/min
GFR calc non Af Amer: >60 mL/min

## 2017-04-24 NOTE — Progress Notes (Addendum)
      [address]       [place] [address]             [phone]      8 Days Post-Op Procedure(s) (LRB): VIDEO BRONCHOSCOPY, Transhiatal Total Esophagectomy, Esophagogastrostomy, pyloromotomy, Feeding Jejunostomy (N/A) ESOPHAGECTOMY COMPLETE,Transhiatal total esophagectomy (N/A) JEJUNOSTOMY,Feeding (N/A)   Subjective:  Mr. Rhinehart has no specific complaints.  He said he is tolerating liquids without difficulty, denies N/V.  + ambulation  No BM or flatus  Objective: Vital signs in last 24 hours: Temp:  [98 F (36.7 C)-98.7 F (37.1 C)] 98.6 F (37 C) (09/25 0418) Pulse Rate:  [97-113] 112 (09/24 2121) Cardiac Rhythm: Sinus tachycardia (09/25 0704) Resp:  [17-28] 28 (09/25 0400) BP: (97-131)/(67-85) 131/81 (09/25 0418) SpO2:  [94 %-100 %] 94 % (09/25 0418) FiO2 (%):  [96 %] 96 % (09/24 2000)  Intake/Output from previous day: 09/24 0701 - 09/25 0700 In: 1777.9 [P.O.:240; I.V.:310.9; NG/GT:1107] Out: 595 [Urine:595]  General appearance: alert, cooperative and no distress Heart: regular rate and rhythm Lungs: coarse throughout Abdomen: + distended, tender around incision sites, no BS appreciated Extremities: extremities normal, atraumatic, no cyanosis or edema Wound: Neck incision with mild purulent drainage, ABD incision C/D/I  Lab Results:  Recent Labs  04/23/17 0253  WBC 10.0  HGB 9.8*  HCT 31.2*  PLT 247   BMET:  Recent Labs  04/23/17 0253 04/24/17 0447  NA 140  --   K 4.0  --   CL 106  --   CO2 28  --   GLUCOSE 115*  --   BUN 17  --   CREATININE 0.58* 0.59*  CALCIUM 8.8*  --     PT/INR: No results for input(s): LABPROT, INR in the last 72 hours. ABG    Component Value Date/Time   PHART 7.430 04/17/2017 0350   HCO3 23.1 04/17/2017 0350   TCO2 24 04/17/2017 0350   ACIDBASEDEF 1.0 04/17/2017 0350   O2SAT 93.0 04/17/2017 0350   CBG (last 3)   Recent Labs  04/23/17 1626 04/23/17 2354 04/24/17 0417  GLUCAP 125* 131*  120*    Assessment/Plan: S/P Procedure(s) (LRB): VIDEO BRONCHOSCOPY, Transhiatal Total Esophagectomy, Esophagogastrostomy, pyloromotomy, Feeding Jejunostomy (N/A) ESOPHAGECTOMY COMPLETE,Transhiatal total esophagectomy (N/A) JEJUNOSTOMY,Feeding (N/A)  1. CV- Sinus Tach, BP remains a little elevated- will increase Lopressor to 25 mg BID 2. Pulm- no on oxygen, continues to have productive cough, sounds coarse throughout, will continue Xopenex, he is afebrile 3. GI- swallow negative for leak, contrast remain in colon per KUB... Patient tolerating clears, tube feeds... Will continue clears for now,  Patient is mildly distended, will not advance diet 4. Dispo- patient stable, increase Lopressor, leave on clear liquids today, Bowel function has been slow to recover need to follow closely   LOS: 8 days    BARRETT, ERIN 04/24/2017  Patient seen last night, diet advanced to full liquids  I have seen and examined [name] and agree with the above assessment  and plan.  [name] MD Beeper [phone] Office [phone] 04/25/2017 10:29 AM

## 2017-04-25 LAB — GLUCOSE, CAPILLARY
Glucose-Capillary: 101 mg/dL — ABNORMAL HIGH (ref 65–99)
Glucose-Capillary: 106 mg/dL — ABNORMAL HIGH (ref 65–99)
Glucose-Capillary: 111 mg/dL — ABNORMAL HIGH (ref 65–99)
Glucose-Capillary: 119 mg/dL — ABNORMAL HIGH (ref 65–99)
Glucose-Capillary: 133 mg/dL — ABNORMAL HIGH (ref 65–99)
Glucose-Capillary: 151 mg/dL — ABNORMAL HIGH (ref 65–99)

## 2017-04-25 MED ORDER — OXYCODONE HCL 5 MG/5ML PO SOLN
5.0000 mg | ORAL | Status: DC | PRN
  Administered 2017-04-25 – 2017-04-28 (×11): 5 mg via JEJUNOSTOMY
  Filled 2017-04-25 (×14): qty 5

## 2017-04-25 MED ORDER — METOPROLOL TARTRATE 25 MG/10 ML ORAL SUSPENSION
25.0000 mg | Freq: Two times a day (BID) | ORAL | Status: DC
  Administered 2017-04-25 – 2017-04-29 (×10): 25 mg via JEJUNOSTOMY
  Filled 2017-04-25 (×9): qty 10

## 2017-04-25 MED ORDER — ACETAMINOPHEN 160 MG/5ML PO SOLN: 325.0000 mg | ORAL | Status: DC | PRN

## 2017-04-25 NOTE — Progress Notes (Addendum)
      [address]       [place] [address]             [phone]        9 Days Post-Op Procedure(s) (LRB): VIDEO BRONCHOSCOPY, Transhiatal Total Esophagectomy, Esophagogastrostomy, pyloromotomy, Feeding Jejunostomy (N/A) ESOPHAGECTOMY COMPLETE,Transhiatal total esophagectomy (N/A) JEJUNOSTOMY,Feeding (N/A)  Subjective: Patient seems a little frustrated this am.   Objective: Vital signs in last 24 hours: Temp:  [98.1 F (36.7 C)-98.8 F (37.1 C)] 98.8 F (37.1 C) (09/26 0430) Pulse Rate:  [99-116] 99 (09/26 0430) Cardiac Rhythm: Sinus tachycardia (09/26 0728) Resp:  [16-24] 23 (09/26 0400) BP: (112-125)/(71-83) 112/71 (09/26 0430) SpO2:  [93 %-100 %] 98 % (09/26 0400) FiO2 (%):  [21 %-100 %] 100 % (09/25 2118)  Current Weight  04/22/17 154 lb 8.7 oz (70.1 kg)    Intake/Output from previous day: 09/25 0701 - 09/26 0700 In: 1070 [P.O.:150; I.V.:120; NG/GT:720] Out: 325 [Urine:325]   Physical Exam:  Cardiovascular: RRR Pulmonary: Rhonchi bilaterally Abdomen: Soft, non tender, protuberant, bowel sounds present. Extremities: Trace bilateral lower extremity edema. Wounds: Clean and dry.  No erythema or signs of infection. Penrose drain still in place-hope to advance soon.  Lab Results: CBC:  Recent Labs  04/23/17 0253  WBC 10.0  HGB 9.8*  HCT 31.2*  PLT 247   BMET:   Recent Labs  04/23/17 0253 04/24/17 0447  NA 140  --   K 4.0  --   CL 106  --   CO2 28  --   GLUCOSE 115*  --   BUN 17  --   CREATININE 0.58* 0.59*  CALCIUM 8.8*  --     PT/INR:  Lab Results  Component Value Date   INR 0.91 04/12/2017   INR 0.98 01/01/2017   INR 0.97 11/08/2016   ABG:  INR: Will add last result for INR, ABG once components are confirmed Will add last 4 CBG results once components are confirmed  Assessment/Plan:  1. CV - ST in the low 100's On Lopressor 12.5 mg via J tube but will increase for better HR control. 2.  Pulmonary - On room  air. CXR ordered for today but not taken yet. Encourage incentive spirometer. 3. GI-Swallow study done Monday showed no leak. Patient tolerating clear liquids. Will defer to Dr. [name] if need to advance to full liquids. 4. Anemia-H and H stable at 9.8 and 31.2  ZIMMERMAN,DONIELLE MPA-C 04/25/2017,8:12 AM  Patient frustrated, not brought any diet  last night or this morning in spite of order for full liquids placed last night. Left neck drain without any drainage , advanced  out slightly today. Continue tube feeding for now, bowels moved today D/c pca pump and move to pain meds per j tube . I have seen and examined [name] and agree with the above assessment  and plan.  [name] MD Beeper [phone] Office [phone] 04/25/2017 10:32 AM

## 2017-04-25 NOTE — Progress Notes (Signed)
Nutrition Follow Up  DOCUMENTATION CODES:   Obesity unspecified  INTERVENTION:    Continue Vital AF 1.2 at goal rate of 60 ml/hr via J-tube   Providing 1728 kcal, 108 grams protein, and 1167 ml free water  Once pt consuming 1/3 to 1/2 of meals, can transition to nocturnal TF regimen  NEW NUTRITION DIAGNOSIS:   Inadequate energy intake related to altered GI function as evidenced by  (need for supplemental J-tube feeding), ongoing  GOAL:   Patient will meet greater than or equal to 90% of their needs, met  MONITOR:   PO intake, TF tolerance, Labs, Weight trends, Skin, I & O's  ASSESSMENT:   Pt with PMH of dysphagia, GERD, and esophageal cancer s/p chemo/XRT now admitted for surgery. Pt is s/p total esophagectomy, esophagogastrostomy, and feeding jejunostomy tube placement.    Pt sitting up in bed. Wife at bedside. S/p esophagram 9/24. Showed no anastomotic leak. Advanced to Full Liquids. Didn't receive a breakfast tray this AM. RN called [facility].  Vital AF 1.2 infusing at goal rate of 60 ml/hr. Tolerating well. Labs and medications reviewed. CBG's [id number].  Diet Order:  Diet full liquid Room service appropriate? Yes; Fluid consistency: Thin  Skin:   (abd/neck incision)  Last BM:  9/26  Height:   Ht Readings from Last 1 Encounters:  04/22/17 _0  (1.549 m)    Weight:   Wt Readings from Last 1 Encounters:  04/22/17 154 lb 8.7 oz (70.1 kg)    Ideal Body Weight:  50.9 kg  BMI:  30 kg/m2  Estimated Nutritional Needs:   Kcal:  1650-1850  Protein:  95-115 grams  Fluid:  > 1.7 L/day  EDUCATION NEEDS:   No education needs identified at this time  Arthur Holms, RD, LDN Pager #: [phone] After-Hours Pager #: [phone]

## 2017-04-26 ENCOUNTER — Inpatient Hospital Stay (HOSPITAL_COMMUNITY): Payer: Medicaid Other

## 2017-04-26 ENCOUNTER — Ambulatory Visit: Payer: Medicaid Other | Admitting: Cardiothoracic Surgery

## 2017-04-26 LAB — CBC
HCT: 30.1 % — ABNORMAL LOW (ref 39.0–52.0)
Hemoglobin: 9.7 g/dL — ABNORMAL LOW (ref 13.0–17.0)
MCH: 29.5 pg (ref 26.0–34.0)
MCHC: 32.2 g/dL (ref 30.0–36.0)
MCV: 91.5 fL (ref 78.0–100.0)
Platelets: 305 10*3/uL (ref 150–400)
RBC: 3.29 MIL/uL — ABNORMAL LOW (ref 4.22–5.81)
RDW: 14.2 % (ref 11.5–15.5)
WBC: 10.3 10*3/uL (ref 4.0–10.5)

## 2017-04-26 LAB — BASIC METABOLIC PANEL
ANION GAP: 7 (ref 5–15)
BUN: 15 mg/dL (ref 6–20)
CALCIUM: 8.6 mg/dL — AB (ref 8.9–10.3)
CHLORIDE: 100 mmol/L — AB (ref 101–111)
CO2: 26 mmol/L (ref 22–32)
CREATININE: 0.49 mg/dL — AB (ref 0.61–1.24)
GFR calc non Af Amer: >60 mL/min (ref >60)
Glucose, Bld: 133 mg/dL — ABNORMAL HIGH (ref 65–99)
Potassium: 4.2 mmol/L (ref 3.5–5.1)
SODIUM: 133 mmol/L — AB (ref 135–145)

## 2017-04-26 LAB — GLUCOSE, CAPILLARY
Glucose-Capillary: 119 mg/dL — ABNORMAL HIGH (ref 65–99)
Glucose-Capillary: 121 mg/dL — ABNORMAL HIGH (ref 65–99)
Glucose-Capillary: 125 mg/dL — ABNORMAL HIGH (ref 65–99)
Glucose-Capillary: 125 mg/dL — ABNORMAL HIGH (ref 65–99)
Glucose-Capillary: 127 mg/dL — ABNORMAL HIGH (ref 65–99)
Glucose-Capillary: 139 mg/dL — ABNORMAL HIGH (ref 65–99)
Glucose-Capillary: 141 mg/dL — ABNORMAL HIGH (ref 65–99)

## 2017-04-26 NOTE — Progress Notes (Addendum)
[address]       [place] [address]             [phone]      10 Days Post-Op Procedure(s) (LRB): VIDEO BRONCHOSCOPY, Transhiatal Total Esophagectomy, Esophagogastrostomy, pyloromotomy, Feeding Jejunostomy (N/A) ESOPHAGECTOMY COMPLETE,Transhiatal total esophagectomy (N/A) JEJUNOSTOMY,Feeding (N/A) Subjective: Feels fairly well, + BM, + incisional soreness, + productive cough  Objective: Vital signs in last 24 hours: Temp:  [98 F (36.7 C)-98.7 F (37.1 C)] 98.4 F (36.9 C) (09/27 0404) Pulse Rate:  [90-115] 101 (09/27 0404) Cardiac Rhythm: Normal sinus rhythm (09/26 1900) Resp:  [21-26] 23 (09/27 0404) BP: (103-122)/(62-85) 103/76 (09/27 0404) SpO2:  [95 %-100 %] 95 % (09/27 0404)  Hemodynamic parameters for last 24 hours:    Intake/Output from previous day: 09/26 0701 - 09/27 0700 In: 720 [P.O.:720] Out: 1201 [Urine:1200; Stool:1] Intake/Output this shift: No intake/output data recorded.  General appearance: alert, cooperative and no distress Heart: regular rate and rhythm and tachy Lungs: coarse ronchi throughout, dim in left base Abdomen: + BS, + distension, + incis tenderness Extremities: no edema or calf tenderness Wound: incis healing well, some purulent drainage from neck incision  Lab Results:  Recent Labs  04/26/17 0358  WBC 10.3  HGB 9.7*  HCT 30.1*  PLT 305   BMET:  Recent Labs  04/24/17 0447 04/26/17 0358  NA  --  133*  K  --  4.2  CL  --  100*  CO2  --  26  GLUCOSE  --  133*  BUN  --  15  CREATININE 0.59* 0.49*  CALCIUM  --  8.6*    PT/INR: No results for input(s): LABPROT, INR in the last 72 hours. ABG    Component Value Date/Time   PHART 7.430 04/17/2017 0350   HCO3 23.1 04/17/2017 0350   TCO2 24 04/17/2017 0350   ACIDBASEDEF 1.0 04/17/2017 0350   O2SAT 93.0 04/17/2017 0350   CBG (last 3)   Recent Labs  04/25/17 2034 04/26/17 0008 04/26/17 0359  GLUCAP 106* 119* 125*    Meds Scheduled  Meds: . Chlorhexidine Gluconate Cloth  6 each Topical Daily  . enoxaparin (LOVENOX) injection  40 mg Subcutaneous Q24H  . insulin aspart  0-24 Units Subcutaneous Q4H  . levalbuterol  0.63 mg Nebulization TID  . mouth rinse  15 mL Mouth Rinse BID  . metoprolol tartrate  25 mg Per J Tube BID  . pantoprazole sodium  40 mg Per Tube BID   Continuous Infusions: . sodium chloride 10 mL/hr at 04/24/17 2148  . feeding supplement (VITAL AF 1.2 CAL) 1,000 mL (04/24/17 2111)  . potassium chloride 10 mEq (04/20/17 1721)   PRN Meds:.oxyCODONE **AND** acetaminophen, acetylcysteine, fluticasone, levalbuterol, ondansetron (ZOFRAN) IV, potassium chloride, sodium chloride flush, sodium chloride flush  Xrays No results found.  Assessment/Plan: S/P Procedure(s) (LRB): VIDEO BRONCHOSCOPY, Transhiatal Total Esophagectomy, Esophagogastrostomy, pyloromotomy, Feeding Jejunostomy (N/A) ESOPHAGECTOMY COMPLETE,Transhiatal total esophagectomy (N/A) JEJUNOSTOMY,Feeding (N/A)  1 doing well overall, still a little tachy, cont metoprolol, may need to increase 2 conts pulm toilet/nebs, pulm toilet 3 tol full liquids, poss advance soon  4 monitor neck incision drainage, no erethema, no fevers or leukocytosis   LOS: 10 days    Darrell Cunningham,Darrell Cunningham 04/26/2017  minimal  drainage from left neck, penrose advanced slightly today no evidence of leak Slowly advance diet  Follow up chest xray  I have seen and examined Darrell Cunningham and agree with the above assessment  and plan.  [name] MD Beeper [phone] Office [phone] 04/26/2017 9:04 AM

## 2017-04-27 LAB — GLUCOSE, CAPILLARY
Glucose-Capillary: 167 mg/dL — ABNORMAL HIGH (ref 65–99)
Glucose-Capillary: 137 mg/dL — ABNORMAL HIGH (ref 65–99)
Glucose-Capillary: 162 mg/dL — ABNORMAL HIGH (ref 65–99)
Glucose-Capillary: 117 mg/dL — ABNORMAL HIGH (ref 65–99)
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
## 2017-04-27 NOTE — Discharge Instructions (Signed)
Feeding Tube Use Some people who have trouble swallowing or cannot take food or medicine by mouth may need a feeding tube. A feeding tube can go into the nose and down to the stomach or small bowel, or through the skin in the abdomen and into the stomach or small bowel. Liquid food (formula), breast milk, or medicines may be given through the tube. Supplies needed for a tube feeding:  Clean gloves.  Prescribed formula or breast milk.  Appropriate feeding bag set, gravity drip tubing set, or 30-60-mL syringe with feeding extension tubing.  Feeding tube pump or syringe pump as needed.  Pole as needed.  20-60-mL syringe to check tube placement.  A syringe to flush the feeding tube.  Container.  Water. How to give a feeding through a feeding tube pump 1. Have all supplies ready and available. 2. Wash hands well. 3. Put on clean gloves. 4. Check the placement of the feeding tube as directed. 5. Raise the head of the person 30-45 degrees or as directed. 6. Pour up to 4 hours of room-temperature feeding into the feeding bag set. 7. Hang the feeding bag set from a pole. 8. Flush the entire feeding bag set with the feeding. 9. Load the feeding bag set into the feeding tube pump. 10. Cap the feeding bag set. 11. Uncap the feeding tube. 12. Using a syringe, flush the feeding tube with water as directed. 13. Uncap the feeding bag set. 14. Connect the feeding bag set to the feeding tube. 15. Remove gloves. 16. Wash hands well. 17. Set the prescribed feeding rate. 18. Start the feeding tube pump. How to give a feeding through a syringe pump 1. Have all supplies ready and available. 2. Wash hands well. 3. Put on clean gloves. 4. Check the placement of the feeding tube as directed. 5. Raise the head of the person 30-45 degrees or as directed. 6. Draw up to 4 hours of room-temperature formula into the correctly sized syringe. 7. Attach the syringe to the feeding extension  tubing. 8. Flush the entire feeding extension tubing with the feeding. 9. Load the syringe and feeding extension tubing into the syringe pump. 10. Cap the feeding extension tubing. 11. Uncap the feeding tube. 12. Using a syringe, flush the feeding tube with water as directed. 13. Uncap the feeding extension tubing. 14. Connect the feeding extension tubing to the feeding tube. 15. Remove gloves. 16. Wash hands well. 17. Set the prescribed feeding rate. 18. Start the syringe pump. How to give a feeding using the gravity method 1. Have all supplies ready and available. 2. Wash hands well. 3. Put on clean gloves. 4. Check the placement of the feeding tube as directed. 5. Raise the head of the person 30-45 degrees or as directed. 6. Close the clamp on the gravity drip tubing set. 7. Pour up to 4 hours of room-temperature feeding into the bag of a gravity drip tubing set. Or, if using a ready-to-hang formula container, connect the gravity drip tubing set to the ready-to-hang container. 8. Hang the feeding with the attached gravity drip tubing set from a pole. 9. Open the roller clamp on the gravity drip tubing to fill the entire tubing with the feeding. 10. Close the roller clamp. 11. Cap the gravity drip tubing. 12. Uncap the feeding tube. 13. Using a syringe, flush the feeding tube with water as directed. 14. Uncap the gravity drip tubing. 15. Connect the gravity drip tubing to the feeding tube. 16. Using the roller  clamp, adjust the feeding rate to deliver the feeding at the prescribed rate. 17. Remove gloves. 18. Wash hands well. How to give a bolus feeding through a feeding tube 1. Remove the plunger from the syringe. 2. Attach the syringe to the feeding tube. 3. Pour the feeding into the syringe. 4. Administer the feeding at the prescribed rate by means of gravity. A feeding bag may also be used for bolus feedings, depending on the volume of feeding given. Supplies needed for  giving medicines through a feeding tube  60-mL syringe.  Container.  Water.  Medicine.  Pill crusher, if medicine is in tablet form.  Clean gloves. How to give medicines through a feeding tube 1. Have all supplies ready and available. 2. Wash hands well. 3. If the medicine cannot be given with the feeding, stop the feeding 60 minutes before giving the medicine. 4. If the person needs to take medicine on an empty stomach, consider not giving a feeding for up to 2 hours (hold time) before giving medicine. Talk to the health care provider about the correct hold times. 5. Fill a container with 50-100 mL of warm water. 6. Prepare medicine that will go into the feeding tube: ? Liquid--Measure the prescribed medicine dose. ? Tablet--Crush the tablet using a pill-crushing device. Grind the pill to a fine powder. Dissolve the powder in at least 30 mL of warm water or as directed. If there is more than 1 tablet, crush and dilute each one individually. ? Capsule--Open a capsule containing dry pellets or pierce a capsule containing liquid (gelcap). Empty the contents in 30 mL of warm water or as directed. Gelcaps can also be dissolved in warm water. 7. Raise the head of the person 30-45 degrees or as directed. 8. Wash hands well. 9. Put on clean gloves. 10. If a continuous tube feeding is infusing, stop the tube feeding. 11. If applicable, close the tubing clamp. 12. Disconnect the feeding bag set, or the gravity drip tubing set, from the feeding tube. 13. Cap the feeding bag set or gravity drip tubing set. 14. Check the placement of the feeding tube as directed. 15. Draw up 30 mL of water into a syringe or as directed. 16. Insert the tip of the syringe into the feeding tube. 17. Using the syringe, flush the feeding tube with water. 18. Remove the plunger of the syringe or replace the syringe with a syringe that contains medicine. 19. Give the medicine as directed. ? If giving only 1 dose of  medicine, flush the feeding tube with water after giving the medicine. ? If giving more than 1 dose of medicine, give each separately. Flush the feeding tube between medicines and after the last dose of medicine. 20. If a tube feeding will not be continued after the medicine, cap the feeding tube. Position the person to a comfortable position, but keep his or her head raised for 1 hour after giving the medicine. 21. If a tube feeding will be continued after the medicine, but the medicine cannot be given with the feeding, continue to hold the feeding for 30-60 minutes after the medicine is given. When appropriate, reconnect the feeding bag set, or gravity drip tubing set, to the feeding tube. 73. Throw away or clean used supplies as directed. 23. Remove gloves. 24. Wash hands well. This information is not intended to replace advice given to you by your health care provider. Make sure you discuss any questions you have with your health care provider. Document  Released: 07/03/2012 Document Revised: 03/22/2016 Document Reviewed: 02/29/2012 Elsevier Interactive Patient Education  2017 [place] of a Feeding Tube People who have trouble swallowing or cannot take food or medicine by mouth are sometimes given feeding tubes. A feeding tube can go into the nose and down to the stomach or through the skin in the abdomen and into the stomach or small bowel. Some of the names of these feeding tubes are gastrostomy tubes, PEG lines, nasogastric tubes, and gastrojejunostomy tubes. Supplies needed to care for the tube site:  Clean gloves.  Clean wash cloth, gauze pads, or soft paper towel.  Cotton swabs.  Skin barrier ointment or cream.  Soap and water.  Pre-cut foam pads or gauze (that go around the tube).  Tube tape. Tube site care 1. Have all supplies ready and available. 2. Wash hands well. 3. Put on clean gloves. 4. Remove the soiled foam pad or gauze, if present, that is found under  the tube stabilizer. Change the foam pad or gauze daily or when soiled or moist. 5. Check the skin around the tube site for redness, rash, swelling, drainage, or extra tissue growth. If you notice any of these, call your caregiver. 6. Moisten gauze and cotton swabs with water and soap. 7. Wipe the area closest to the tube (right near the stoma) with cotton swabs. Wipe the surrounding skin with moistened gauze. Rinse with water. 8. Dry the skin and stoma site with a dry gauze pad or soft paper towel. Do not use antibiotic ointments at the tube site. 9. If the skin is red, apply a skin barrier cream or ointment (such as petroleum jelly) in a circular motion, using a cotton swab. The cream or ointment will provide a moisture barrier for the skin and helps with wound healing. 10. Apply a new pre-cut foam pad or gauze around the tube. Secure it with tape around the edges. If no drainage is present, foam pads or gauze may be left off. 11. Use tape or an anchoring device to fasten the feeding tube to the skin for comfort or as directed. Rotate where you tape the tube to avoid skin damage from the adhesive. 12. Position the person in a semi-upright position (30-45 degree angle). 13. Throw away used supplies. 14. Remove gloves. 15. Wash hands. Supplies needed to flush a feeding tube:  Clean gloves.  60 mL syringe (that connects to the feeding tube).  Towel.  Water. Flushing a feeding tube 1. Have all supplies ready and available. 2. Wash hands well. 3. Put on clean gloves. 4. Draw up 30 mL of water in the syringe. 5. Kink the feeding tube while disconnecting it from the feeding-bag tubing or while removing the plug at the end of the tube. Kinking closes the tube and prevents secretions in the tube from spilling out. 6. Insert the tip of the syringe into the end of the feeding tube. Release the kink. Slowly inject the water. 7. If unable to inject the water, the person with the feeding tube should  lay on his or her left side. The tip of the tube may be against the stomach wall, blocking fluid flow. Changing positions may move the tip away from the stomach wall. After repositioning, try injecting the water again. ? Do not use a syringe smaller than 60 mL to flush the tubing. ? Do not use excessive force to overcome resistance because this could cause the tube to rupture. 8. After injecting the water, remove the syringe.  9. Always flush before giving the first medicine, between medicines, and after the final medicine before starting a feeding. This prevents medicines from clogging the tube. ? Do not mix medicines with formula or with other medicines before giving medicines. ? Thoroughly flush medicines through the tube so they do not mix with formula. 10. Throw away used supplies. 11. Remove gloves. 12. Wash hands. This information is not intended to replace advice given to you by your health care provider. Make sure you discuss any questions you have with your health care provider. Document Released: 07/17/2005 Document Revised: 12/29/2015 Document Reviewed: 02/29/2012 Elsevier Interactive Patient Education  2017 [employer] Esophagectomy, Care After This sheet gives you information about how to care for yourself after your procedure. Your health care provider may also give you more specific instructions. If you have problems or questions, contact your health care provider. What can I expect after the procedure? After the procedure, it is common to have:  Soreness near the incision sites.  Soreness when swallowing food.  Tiredness (fatigue).  Loss of appetite.  Nausea.  Heartburn.  Diarrhea.  Follow these instructions at home: Feeding tube  If you were sent home with a feeding tube, follow instructions from your health care provider about taking care of the tube.  Work with your nutrition care provider for using your feeding tube and getting enough nutrition. Eating and  drinking  Follow instructions from your health care provider about eating or drinking restrictions. You may need to eat smaller and more frequent meals.  Drink enough fluid to keep your urine clear or pale yellow. Activity  Do not lift anything that is heavier than 10 lb (4.5 kg) until your surgeon says you can.  Do not drive or operate heavy machinery while taking prescription pain medicine.  Return to your normal activities as told by your health care provider. Ask your health care provider what activities are safe for you. Incision care  Follow instructions from your health care provider about how to take care of your incision. Make sure you: ? Change your bandage (dressing) as told by your health care provider. ? Wash your hands with soap and water before you change your dressing. If soap and water are not available, use hand sanitizer. ? Leave stitches (sutures), skin glue, or adhesive strips in place. These skin closures may need to stay in place for 2 weeks or longer. If adhesive strip edges start to loosen and curl up, you may trim the loose edges. Do not remove adhesive strips completely unless your health care provider tells you to do that.  Do not take baths, swim, or use a hot tub until your health care provider approves  Check your incision areas every day for signs of infection. Check for: ? More redness, swelling, or pain. ? More fluid or blood. ? Warmth. ? Pus or a bad smell. General instructions  To prevent or treat constipation while you are taking prescription pain medicine, your health care provider may recommend that you: ? Take over-the-counter or prescription medicines. ? Eat foods that are high in fiber, such as fresh fruits and vegetables, whole grains, and beans. ? Limit foods that are high in fat and processed sugars, such as fried and sweet foods.  Take over-the-counter and prescription medicines only as told by your health care provider.  Wear  compression stockings as told by your health care provider. These stockings help prevent blood clots and reduce swelling in your legs.  Do not use  any products that contain nicotine or tobacco, such as cigarettes and e-cigarettes. If you need help quitting, ask your health care provider.  Keep all follow-up visits as told by your health care provider. This is important. Contact a health care provider if:  You have problems or questions about your feeding tube.  You have chills or fever.  You have pain that is not controlled by your pain medicine.  You have trouble swallowing.  You have persistent or worsening heartburn.  You have persistent or worsening diarrhea.  You have more redness, swelling, or pain around your incision area.  You have more fluid coming from your incision area.  Your incision feels warm to the touch.  You have pus or a bad smell coming from your incision area.  You have a persistent or worsening cough. Get help right away if:  You have severe pain.  You are bleeding from an incision area.  You are vomiting blood.  You are unable to swallow.  You have trouble breathing.  You have chest pain. Summary  Expect some discomfort and fatigue during your recovery.  Work with your nutrition care provider if you still have a feeding tube at home.  Follow your surgeons directions for care of your incision areas.  Return to normal activities gradually, as told by your health care provider. This information is not intended to replace advice given to you by your health care provider. Make sure you discuss any questions you have with your health care provider. Document Released: 03/16/2016 Document Revised: 03/16/2016 Document Reviewed: 03/16/2016 Elsevier Interactive Patient Education  [employer].

## 2017-04-27 NOTE — Care Management Note (Signed)
Case Management Note Marvetta Gibbons RN, BSN Unit 4E-Case Manager [phone]  Patient Details  Name: MARQUEZ CEESAY MRN: [id number] Date of Birth: [birth year]  Subjective/Objective:   Pt admitted s/p total esophagectomy- for esophageal CA- placement of J tube.                  Action/Plan: PTA pt lived at home- per conversation with pt - plan if for pt to stay with parents after discharge- address for parents- is [address] [place], [place] [address] Phone: [phone]-- discussed with pt possible need for [facility] services and maybe TF at discharge- choice offered for [facility] agencies- per pt he would like to keep everything with one company and has selected [facility] for any HH needs including TF. If pt needs HHRN and/or TF for home will need HH orders and orders for TF at time of discharge. Pt also would like a RW for home and would need DME order for RW. -- Cm to continue to follow for d/c needs  Expected Discharge Date:                  Expected Discharge Plan:  [place]  In-House Referral:     Discharge planning Services  CM Consult  Post Acute Care Choice:    Choice offered to:     DME Arranged:    DME Agency:     HH Arranged:    [place] Agency:   [facility]  Status of Service:  In process, will continue to follow  If discussed at Long Length of Stay Meetings, dates discussed:    Discharge Disposition:   Additional Comments:  Dawayne Patricia, RN 04/27/2017, 3:18 PM

## 2017-04-27 NOTE — Progress Notes (Addendum)
[address]       [place] [address]             [phone]      11 Days Post-Op Procedure(s) (LRB): VIDEO BRONCHOSCOPY, Transhiatal Total Esophagectomy, Esophagogastrostomy, pyloromotomy, Feeding Jejunostomy (N/A) ESOPHAGECTOMY COMPLETE,Transhiatal total esophagectomy (N/A) JEJUNOSTOMY,Feeding (N/A) Subjective: Feels pretty well , some soreness persists, still pretty congested.   Objective: Vital signs in last 24 hours: Temp:  [97.5 F (36.4 C)-98.6 F (37 C)] 97.5 F (36.4 C) (09/28 0400) Pulse Rate:  [46-113] 46 (09/28 0400) Cardiac Rhythm: Normal sinus rhythm (09/27 2036) Resp:  [20-29] 20 (09/28 0400) BP: (105-129)/(68-79) 121/72 (09/28 0400) SpO2:  [79 %-99 %] 79 % (09/28 0400)  Hemodynamic parameters for last 24 hours:    Intake/Output from previous day: 09/27 0701 - 09/28 0700 In: 395 [P.O.:395] Out: 1675 [Urine:1675] Intake/Output this shift: No intake/output data recorded.  General appearance: alert, cooperative and no distress Heart: regular rate and rhythm Lungs: coarse Ronchi throughout  Abdomen: + some distension, + BS, some incisional tenderness Extremities: no edema or calf tenderness Wound: incis healing well, neck drain fell out, I removed suture and re-dressed  Lab Results:  Recent Labs  04/26/17 0358  WBC 10.3  HGB 9.7*  HCT 30.1*  PLT 305   BMET:  Recent Labs  04/26/17 0358  NA 133*  K 4.2  CL 100*  CO2 26  GLUCOSE 133*  BUN 15  CREATININE 0.49*  CALCIUM 8.6*    PT/INR: No results for input(s): LABPROT, INR in the last 72 hours. ABG    Component Value Date/Time   PHART 7.430 04/17/2017 0350   HCO3 23.1 04/17/2017 0350   TCO2 24 04/17/2017 0350   ACIDBASEDEF 1.0 04/17/2017 0350   O2SAT 93.0 04/17/2017 0350   CBG (last 3)   Recent Labs  04/26/17 2017 04/27/17 0001 04/27/17 0404  GLUCAP 121* 125* 167*    Meds Scheduled Meds: . Chlorhexidine Gluconate Cloth  6 each Topical Daily  .  enoxaparin (LOVENOX) injection  40 mg Subcutaneous Q24H  . insulin aspart  0-24 Units Subcutaneous Q4H  . levalbuterol  0.63 mg Nebulization TID  . mouth rinse  15 mL Mouth Rinse BID  . metoprolol tartrate  25 mg Per J Tube BID  . pantoprazole sodium  40 mg Per Tube BID   Continuous Infusions: . sodium chloride 10 mL/hr at 04/24/17 2148  . feeding supplement (VITAL AF 1.2 CAL) 1,000 mL (04/27/17 0356)  . potassium chloride 10 mEq (04/20/17 1721)   PRN Meds:.oxyCODONE **AND** acetaminophen, acetylcysteine, fluticasone, levalbuterol, ondansetron (ZOFRAN) IV, potassium chloride, sodium chloride flush, sodium chloride flush  Xrays Dg Chest 2 View  Result Date: 04/26/2017 CLINICAL DATA:  History of esophagectomy 11 days ago, some chest pain, postop check EXAM: CHEST  2 VIEW COMPARISON:  Chest x-ray of 04/23/2017 FINDINGS: There is persistent small left pleural effusion with left basilar atelectasis postoperatively. The right lung is clear. Right-sided Port-A-Cath tip is seen to the expected SVC -RA junction and the heart is unchanged in size. No bony abnormality is seen. IMPRESSION: Persistent small left pleural effusion with left basilar atelectasis. Electronically Signed   By: Ivar Drape M.D.   On: 04/26/2017 09:48    Assessment/Plan: S/P Procedure(s) (LRB): VIDEO BRONCHOSCOPY, Transhiatal Total Esophagectomy, Esophagogastrostomy, pyloromotomy, Feeding Jejunostomy (N/A) ESOPHAGECTOMY COMPLETE,Transhiatal total esophagectomy (N/A) JEJUNOSTOMY,Feeding (N/A)   1 doing well  2 tolerating diet and TF's 3 minimal drainage from neck without erethema 4  BS stable 5 hemodyn stable 6 no new labs 7 poss home 1-2 days  LOS: 11 days    GOLD,[name] 04/27/2017  I have seen and examined Darrell Cunningham and agree with the above assessment  and plan.  [name] MD Beeper [phone] Office [phone] 04/27/2017 9:23 PM

## 2017-04-28 LAB — GLUCOSE, CAPILLARY
Glucose-Capillary: 114 mg/dL — ABNORMAL HIGH (ref 65–99)
Glucose-Capillary: 150 mg/dL — ABNORMAL HIGH (ref 65–99)

## 2017-04-28 NOTE — Progress Notes (Addendum)
      [address]       [place] [address]             [phone]      12 Days Post-Op Procedure(s) (LRB): VIDEO BRONCHOSCOPY, Transhiatal Total Esophagectomy, Esophagogastrostomy, pyloromotomy, Feeding Jejunostomy (N/A) ESOPHAGECTOMY COMPLETE,Transhiatal total esophagectomy (N/A) JEJUNOSTOMY,Feeding (N/A)   Subjective:  Patient states doing okay.  States he gets anxious overnight.  Tolerating diet without issues, denies N/V.  He has moved his bowels.  Objective: Vital signs in last 24 hours: Temp:  [98 F (36.7 C)-98.8 F (37.1 C)] 98 F (36.7 C) (09/29 0818) Pulse Rate:  [78-107] 83 (09/29 0818) Cardiac Rhythm: Sinus tachycardia (09/29 0800) Resp:  [16-22] 21 (09/29 0818) BP: (108-127)/(66-82) 111/72 (09/29 0818) SpO2:  [95 %-100 %] 96 % (09/29 0818)  Intake/Output from previous day: 09/28 0701 - 09/29 0700 In: -  Out: 200 [Urine:200] Intake/Output this shift: Total I/O In: 120 [P.O.:120] Out: -   General appearance: alert, cooperative and no distress Heart: regular rate and rhythm Lungs: clear to auscultation bilaterally Abdomen: soft, non-tender; bowel sounds normal; no masses,  no organomegaly Extremities: extremities normal, atraumatic, no cyanosis or edema Wound: clean and dry  Lab Results:  Recent Labs  04/26/17 0358  WBC 10.3  HGB 9.7*  HCT 30.1*  PLT 305   BMET:  Recent Labs  04/26/17 0358  NA 133*  K 4.2  CL 100*  CO2 26  GLUCOSE 133*  BUN 15  CREATININE 0.49*  CALCIUM 8.6*    PT/INR: No results for input(s): LABPROT, INR in the last 72 hours. ABG    Component Value Date/Time   PHART 7.430 04/17/2017 0350   HCO3 23.1 04/17/2017 0350   TCO2 24 04/17/2017 0350   ACIDBASEDEF 1.0 04/17/2017 0350   O2SAT 93.0 04/17/2017 0350   CBG (last 3)   Recent Labs  04/27/17 2010 04/28/17 0015 04/28/17 0416  GLUCAP 117* 114* 150*    Assessment/Plan: S/P Procedure(s) (LRB): VIDEO BRONCHOSCOPY, Transhiatal Total  Esophagectomy, Esophagogastrostomy, pyloromotomy, Feeding Jejunostomy (N/A) ESOPHAGECTOMY COMPLETE,Transhiatal total esophagectomy (N/A) JEJUNOSTOMY,Feeding (N/A)  1. CV- hemodynamically stable 2. Pulm- no acute issues, continue IS 3. GI- tolerating diet, tube feeds currently at 88ml/hr, bowels have moved 4. Dispo- patient stable, likely ready for d/c in AM   LOS: 12 days    BARRETT, ERIN 04/28/2017  Wounds all ok , food today was too spicy, taking po but likely will need tf supplement at hope, may run tube feed at time only depending on po intake. I have seen and examined Darrell Cunningham and agree with the above assessment  and plan.  [name] MD Beeper [phone] Office [phone] 04/28/2017 7:35 PM

## 2017-04-29 MED ORDER — OXYCODONE HCL 5 MG/5ML PO SOLN: 5.0000 mg | ORAL | 0 refills | Status: DC | PRN

## 2017-04-29 MED ORDER — HEPARIN SOD (PORK) LOCK FLUSH 100 UNIT/ML IV SOLN
500.0000 [IU] | INTRAVENOUS | Status: DC
  Filled 2017-04-29: qty 5

## 2017-04-29 MED ORDER — METOPROLOL TARTRATE 25 MG/10 ML ORAL SUSPENSION: 25.0000 mg | Freq: Two times a day (BID) | ORAL | 5 refills | Status: DC

## 2017-04-29 MED ORDER — ACETAMINOPHEN 160 MG/5ML PO SOLN: 325.0000 mg | ORAL | 0 refills | Status: DC | PRN

## 2017-04-29 MED ORDER — VITAL AF 1.2 CAL PO LIQD: 1000.0000 mL | ORAL | 6 refills | Status: DC

## 2017-04-29 MED ORDER — PANTOPRAZOLE SODIUM 40 MG PO PACK: 40.0000 mg | PACK | Freq: Two times a day (BID) | ORAL | 3 refills | Status: DC

## 2017-04-29 MED ORDER — HEPARIN SOD (PORK) LOCK FLUSH 100 UNIT/ML IV SOLN
500.0000 [IU] | INTRAVENOUS | Status: DC | PRN
  Administered 2017-04-29: 500 [IU]
  Filled 2017-04-29 (×2): qty 5

## 2017-04-29 NOTE — Care Management Note (Addendum)
Case Management Note Marvetta Gibbons RN, BSN Unit 4E-Case Manager [phone]  Patient Details  Name: Darrell Cunningham MRN: [id number] Date of Birth: February 15, 1961  Subjective/Objective:   Pt admitted s/p total esophagectomy- for esophageal CA- placement of J tube.                  Action/Plan: PTA pt lived at home- per conversation with pt - plan if for pt to stay with parents after discharge- address for parents- is [address] [place], [place] [address] Phone: [phone]-- discussed with pt possible need for [facility] services and maybe TF at discharge- choice offered for [facility] agencies- per pt he would like to keep everything with one company and has selected [facility] for any HH needs including TF. If pt needs HHRN and/or TF for home will need HH orders and orders for TF at time of discharge. Pt also would like a RW for home and would need DME order for RW. -- Cm to continue to follow for d/c needs  Expected Discharge Date:  04/29/17               Expected Discharge Plan:  [place]  In-House Referral:  NA  Discharge planning Services  CM Consult  Post Acute Care Choice:  Durable Medical Equipment, Home Health Choice offered to:  Patient  DME Arranged:  Tube feeding, Tube feeding pump, Walker rolling DME Agency:  [place] Arranged:  RN [facility] Agency:  [place]  Status of Service:  Completed, signed off  If discussed at [place] of Stay Meetings, dates discussed:    Discharge Disposition: home/home health   Additional Comments:  04/29/17- 0915- Marvetta Gibbons RN, CM- pt for d/c home today with parent- orders have been placed for DME and HHRN- pt will need TF at night 7p-7a- have placed referral call to [facility] with [place] per previous conversation with pt- for [facility] and DME needs- Jermaine will deliver RW to room prior to discharge- and arrange [place] needs along with TF.  Have spoken with dietitian for d/c recommendations for TF- and  alternative due to cost of [place] for pt- per bedside RN-Kristin- Erin-PA is ok with Dietitian recommendations for TF.   Dawayne Patricia, RN 04/29/2017, 9:16 AM

## 2017-04-29 NOTE — Progress Notes (Signed)
Patient given discharge instructions medication list and paper prescriptions. Patient also given exit care documents on feeding tube, esophagectomy care and flu vaccine information. Telemetry removed IV team de accessed porta-cath. And will discharge home with family as ordered. Durene Dodge, [name] RN

## 2017-04-29 NOTE — Progress Notes (Signed)
Patient ambulated in hallway with nursing staff. Latoiya Maradiaga, [name] rN

## 2017-04-29 NOTE — Progress Notes (Signed)
Nutrition Brief Note  Received page from case management, pt to discharge today and needs home tube feed orders. Pt with J-tube and prefers to be on nocturnal feeding from 7p-7a.   Recommend change to Osmolite 1.5 at goal rate of 136ml/hr x 12 hrs (from 7p-7a)  Add Prostat 41ml BID via tube (mix with 80ml water when administering)   Free water flushes 164ml q 4 hrs  Regimen provides 2000kcal/day, 105g/day protein, 1873ml free water  Recommend initiate at 96ml/hr x 12 hrs and increase by 46ml/hr daily until goal rate is reached. Can advance slower if needed.   Estimated Nutritional Needs:   Kcal:  1650-1850  Protein:  95-115 grams  Fluid:  > 1.7 L/day  Koleen Distance MS, RD, LDN Pager #- [phone] After Hours Pager: [phone]

## 2017-04-29 NOTE — Progress Notes (Signed)
Patient CT suture removed as ordered and steri strip applied. [name], [name] rN

## 2017-04-30 DIAGNOSIS — C159 Malignant neoplasm of esophagus, unspecified: Secondary | ICD-10-CM | POA: Diagnosis not present

## 2017-04-30 DIAGNOSIS — J45909 Unspecified asthma, uncomplicated: Secondary | ICD-10-CM | POA: Diagnosis not present

## 2017-05-16 ENCOUNTER — Other Ambulatory Visit: Payer: Self-pay | Admitting: Cardiothoracic Surgery

## 2017-05-16 DIAGNOSIS — C159 Malignant neoplasm of esophagus, unspecified: Secondary | ICD-10-CM

## 2017-05-17 ENCOUNTER — Encounter: Payer: Self-pay | Admitting: Cardiothoracic Surgery

## 2017-05-17 ENCOUNTER — Ambulatory Visit
Admission: RE | Admit: 2017-05-17 | Discharge: 2017-05-17 | Disposition: A | Discharge status: Home / Self Care | Payer: Medicaid Other | Source: Ambulatory Visit | Attending: Cardiothoracic Surgery | Admitting: Cardiothoracic Surgery

## 2017-05-17 ENCOUNTER — Ambulatory Visit (INDEPENDENT_AMBULATORY_CARE_PROVIDER_SITE_OTHER): Payer: Self-pay | Admitting: Cardiothoracic Surgery

## 2017-05-17 VITALS — BP 115/76 | HR 112 | Ht 61.0 in | Wt 143.0 lb

## 2017-05-17 DIAGNOSIS — C159 Malignant neoplasm of esophagus, unspecified: Secondary | ICD-10-CM

## 2017-05-17 MED ORDER — METOPROLOL TARTRATE 25 MG PO TABS: 12.5000 mg | ORAL_TABLET | Freq: Two times a day (BID) | ORAL | 1 refills | Status: DC

## 2017-05-17 NOTE — Progress Notes (Signed)
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 PROCEDURES PERFORMED:  Bronchoscopy, transhiatal total esophagectomy with cervical esophagogastrostomy, pyloromyotomy, and feeding jejunostomy tube. SURGEON:  [name], MD.  Cancer Staging Malignant neoplasm of lower third of esophagus [facility]) Staging form: Esophagus - Adenocarcinoma, AJCC 8th Edition - Clinical stage from 10/26/2016: Stage III (cT3, cN1, cM0) - Signed by [name], MD on 11/05/2016 - Pathologic stage from 04/20/2017: Stage I (ypT0, pN0, cM0, GX) - Signed by [name], MD on 04/23/2017   History of Present Illness:      Patient returns to the office today for postop check after recent esophagectomy. The patient notes he is able to take a by mouth diet but has very little appetite and feels full in the morning when he gets up. He is currently using jejunal tube feedings at nighttime. He denies any loose stools infected complains of constipation.    Zubrod Score: At the time of surgery this patient's most appropriate activity status/level should be described as: [x]     0    Normal activity, no symptoms []     1    Restricted in physical strenuous activity but ambulatory, able to do out light work []     2    Ambulatory and capable of self care, unable to do work activities, up and about                 >50 % of waking hours                                                                                   []     3    Only limited self care, in bed greater  than 50% of waking hours []     4    Completely disabled, no self care, confined to bed or chair []     5    Moribund  History  Smoking Status  . Former Smoker  . Packs/day: 1.00  . Years: 38.00  . Types: Cigarettes  . Start date: 10/31/2012  . Quit date: 10/31/2012  Smokeless Tobacco  . Never Used       Allergies  Allergen Reactions  . Penicillins Hives, Itching and Swelling    Has patient had a PCN reaction causing immediate rash, facial/tongue/throat swelling, SOB or lightheadedness with hypotension: No Has patient had a PCN reaction causing severe rash involving mucus membranes or skin necrosis: No Has patient had a PCN reaction that required hospitalization No Has patient had a PCN reaction occurring within the last 10 years: No If all of the above answers are "NO", then may proceed  with Cephalosporin use. Unknown Childhood reaction    Current Outpatient Prescriptions  Medication Sig Dispense Refill  . acetaminophen (TYLENOL) 160 MG/5ML solution Take 10.2 mLs (325 mg total) by mouth every 4 (four) hours as needed for moderate pain. 120 mL 0  . fluticasone (FLONASE) 50 MCG/ACT nasal spray Place 1 spray into both nostrils daily as needed for allergies or rhinitis.    Marland Kitchen ipratropium-albuterol (DUONEB) 0.5-2.5 (3) MG/3ML SOLN Take 3 mLs by nebulization every 6 (six) hours as needed. (Patient taking differently: Take 3 mLs by nebulization every 6 (six) hours as needed (for wheezing/shortness of breath). ) 360 mL 0  . lidocaine-prilocaine (EMLA) cream Apply 1 application topically as needed. Apply to portacath  1 1/2 hours - 2 hours prior to procedures as needed. 30 g 1  . metoprolol tartrate (LOPRESSOR) 25 mg/10 mL SUSP Take 10 mLs (25 mg total) by mouth 2 (two) times daily. 600 mL 5  . Nutritional Supplements (FEEDING SUPPLEMENT, VITAL AF 1.2 CAL,) LIQD Place 1,000 mLs into feeding tube continuous. At 60 ml/hr from 7pm to 7 am 30000 mL 6  . ondansetron (ZOFRAN) 8 MG tablet Take 1  tablet (8 mg total) by mouth 2 (two) times daily as needed for refractory nausea / vomiting. Start on day 3 after chemo. 30 tablet 1  . oxyCODONE (ROXICODONE) 5 MG/5ML solution Take 5 mLs (5 mg total) by mouth every 4 (four) hours as needed for severe pain. 210 mL 0  . pantoprazole sodium (PROTONIX) 40 mg/20 mL PACK Take 20 mLs (40 mg total) by mouth 2 (two) times daily. 60 each 3  . PROAIR HFA 108 (90 Base) MCG/ACT inhaler INHALE 2 PUFFS INTO THE LUNGS EVERY 6 (SIX) HOURS AS NEEDED FOR WHEEZING OR SHORTNESS OF BREATH. 18 g 0   No current facility-administered medications for this visit.        Physical Exam: BP 115/76   Pulse (!) 112   Ht 5\' 1"  (1.549 m)   Wt 143 lb (64.9 kg)   SpO2 97%   BMI 27.02 kg/m   General appearance: alert and cooperative Neurologic: intact Heart: regular rate and rhythm, S1, S2 normal, no murmur, click, rub or gallop Lungs: clear to auscultation bilaterally Abdomen: soft, non-tender; bowel sounds normal; no masses,  no organomegaly and J-tube in place and without much irritation around the tube Extremities: extremities normal, atraumatic, no cyanosis or edema and Homans sign is negative, no sign of DVT Wound: left neck incision and abdominal incision are well-healed without evidence of infection  Diagnostic Studies & Laboratory data:         Recent Radiology Findings: Dg Chest 2 View  Result Date: 05/17/2017 CLINICAL DATA:  Malignant neoplasm of the esophagus. Productive cough. EXAM: CHEST  2 VIEW COMPARISON:  Radiographs of April 26, 2017. FINDINGS: The heart size and mediastinal contours are within normal limits. No pneumothorax is noted. Right internal jugular Port-A-Cath is noted with distal tip in expected position of cavoatrial junction. Right lung is clear. Mild left basilar atelectasis is noted with minimal left pleural effusion. The visualized skeletal structures are unremarkable. IMPRESSION: Mild left basilar atelectasis is noted with minimal  left pleural effusion. No significant changes noted compared to prior exam. Electronically Signed   By: Marijo Conception, M.D.   On: 05/17/2017 10:12      I have independently reviewed the above radiology findings and reviewed findings  with the patient.  Recent Labs: Lab Results  Component Value Date  WBC 10.3 04/26/2017   HGB 9.7 (L) 04/26/2017   HCT 30.1 (L) 04/26/2017   PLT 305 04/26/2017   GLUCOSE 133 (H) 04/26/2017   CHOL 157 10/27/2014   TRIG 83 10/27/2014   HDL 34 (L) 10/27/2014   LDLCALC 106 (H) 10/27/2014   ALT 57 04/23/2017   AST 32 04/23/2017   NA 133 (L) 04/26/2017   K 4.2 04/26/2017   CL 100 (L) 04/26/2017   CREATININE 0.49 (L) 04/26/2017   BUN 15 04/26/2017   CO2 26 04/26/2017   INR 0.91 04/12/2017   HGBA1C 5.7 (H) 10/27/2014      Assessment / Plan:      Patient doing well following recent transhiatal total esophagectomy- complains of being full and poor appetite. We will discontinued jejunostomy tube feedings at night and see if his appetite will pick up. Continued to increase his physical activity avoiding any lifting I plan to see him back in 2 weeks    [name] 05/17/2017 10:28 AM

## 2017-05-31 ENCOUNTER — Ambulatory Visit (INDEPENDENT_AMBULATORY_CARE_PROVIDER_SITE_OTHER): Payer: Self-pay | Admitting: *Deleted

## 2017-05-31 ENCOUNTER — Encounter: Payer: Medicaid Other | Admitting: Cardiothoracic Surgery

## 2017-05-31 DIAGNOSIS — J45909 Unspecified asthma, uncomplicated: Secondary | ICD-10-CM | POA: Diagnosis not present

## 2017-05-31 DIAGNOSIS — Z9221 Personal history of antineoplastic chemotherapy: Secondary | ICD-10-CM

## 2017-05-31 DIAGNOSIS — Z923 Personal history of irradiation: Secondary | ICD-10-CM

## 2017-05-31 DIAGNOSIS — C159 Malignant neoplasm of esophagus, unspecified: Secondary | ICD-10-CM

## 2017-05-31 DIAGNOSIS — Z4889 Encounter for other specified surgical aftercare: Secondary | ICD-10-CM

## 2017-05-31 NOTE — Progress Notes (Signed)
Darrell Cunningham originally had f/u with Dr. [name] scheduled for today, but his apt. had to be rescheduled. He called to saying his feeding tube dressing needed changing, one reason for the visit. I offered for him to come to the office today. The dressing had not been changed since his last visit. An occlusive dressing had been. When I was removing the dressing, it was evident the suture  was not intact.  There was greenish sticky drainage around the tube.  Jadene Pierini, PA-C was in the office and I requested he place another suture. The site was cleaned and prepared with Betadine, anesthetized with ethyl chloride spray. He placed one 3-0 nylon suture. The area was redressed. He will return next week as scheduled. He is c/o continued cough with phlegm and feeling as if food gets stuck in his throatt on occasion. I said we would readdress these issues at his next visit. He agreed.

## 2017-06-07 ENCOUNTER — Encounter: Payer: Self-pay | Admitting: Cardiothoracic Surgery

## 2017-06-07 ENCOUNTER — Other Ambulatory Visit: Payer: Self-pay

## 2017-06-07 ENCOUNTER — Ambulatory Visit (INDEPENDENT_AMBULATORY_CARE_PROVIDER_SITE_OTHER): Payer: Self-pay | Admitting: Cardiothoracic Surgery

## 2017-06-07 ENCOUNTER — Encounter: Payer: Medicaid Other | Admitting: Cardiothoracic Surgery

## 2017-06-07 VITALS — BP 104/68 | HR 77 | Ht 61.0 in | Wt 142.0 lb

## 2017-06-07 DIAGNOSIS — Z923 Personal history of irradiation: Secondary | ICD-10-CM

## 2017-06-07 DIAGNOSIS — Z9221 Personal history of antineoplastic chemotherapy: Secondary | ICD-10-CM

## 2017-06-07 DIAGNOSIS — D490 Neoplasm of unspecified behavior of digestive system: Secondary | ICD-10-CM

## 2017-06-07 DIAGNOSIS — C159 Malignant neoplasm of esophagus, unspecified: Secondary | ICD-10-CM

## 2017-06-07 NOTE — Progress Notes (Signed)
[address]       [place] [address]             [phone]                  Darrell Cunningham 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 PROCEDURES PERFORMED:  Bronchoscopy, transhiatal total esophagectomy with cervical esophagogastrostomy, pyloromyotomy, and feeding jejunostomy tube. SURGEON:  [name], MD.  Cancer Staging Malignant neoplasm of lower third of esophagus [facility]) Staging form: Esophagus - Adenocarcinoma, AJCC 8th Edition - Clinical stage from 10/26/2016: Stage III (cT3, cN1, cM0) - Signed by [name], MD on 11/05/2016 - Pathologic stage from 04/20/2017: Stage I (ypT0, pN0, cM0, GX) - Signed by [name], MD on 04/23/2017   History of Present Illness:      Patient returns to the office today for postop check after recent esophagectomy.  Patient continues to take a p.o. Diet, he notes getting full quickly but is able to take all of his nutrition by mouth.  He has not been using his jejunostomy tube feedings for the past 2-1/2 weeks.  He has had no fever or chills, no dumping syndrome.  In fact he notes that he has been constipated at times   Zubrod Score: At the time of surgery this patient's most appropriate activity status/level should be described as: [x]     0    Normal activity, no symptoms []     1    Restricted in physical strenuous activity but ambulatory, able to do out light work []     2    Ambulatory and capable of self care, unable to do work activities, up and about                 >50 % of waking hours                                                                                    []     3    Only limited self care, in bed greater than 50% of waking hours []     4    Completely disabled, no self care, confined to bed or chair []     5    Moribund  Social History   Tobacco Use  Smoking Status Former Smoker  . Packs/day: 1.00  . Years: 38.00  . Pack years: 38.00  . Types: Cigarettes  . Start date: 10/31/2012  . Last attempt to quit: 10/31/2012  . Years since quitting: 4.6  Smokeless Tobacco Never Used       Allergies  Allergen Reactions  . Penicillins Hives, Itching and Swelling    Has patient had a PCN reaction causing immediate rash, facial/tongue/throat swelling, SOB or lightheadedness with hypotension: No Has patient had a PCN reaction causing severe rash involving mucus membranes or skin necrosis: No Has patient had a  PCN reaction that required hospitalization No Has patient had a PCN reaction occurring within the last 10 years: No If all of the above answers are "NO", then may proceed with Cephalosporin use. Unknown Childhood reaction    Current Outpatient Medications  Medication Sig Dispense Refill  . acetaminophen (TYLENOL) 160 MG/5ML solution Take 10.2 mLs (325 mg total) by mouth every 4 (four) hours as needed for moderate pain. 120 mL 0  . fluticasone (FLONASE) 50 MCG/ACT nasal spray Place 1 spray into both nostrils daily as needed for allergies or rhinitis.    Marland Kitchen ipratropium-albuterol (DUONEB) 0.5-2.5 (3) MG/3ML SOLN Take 3 mLs by nebulization every 6 (six) hours as needed. (Patient taking differently: Take 3 mLs by nebulization every 6 (six) hours as needed (for wheezing/shortness of breath). ) 360 mL 0  . lidocaine-prilocaine (EMLA) cream Apply 1 application topically as needed. Apply to portacath  1 1/2 hours - 2 hours prior to procedures as needed. 30 g 1  . metoprolol tartrate (LOPRESSOR) 25 MG tablet Take 0.5 tablets (12.5 mg total) by mouth 2 (two) times daily. 60 tablet 1  . Nutritional Supplements (FEEDING SUPPLEMENT, VITAL AF 1.2 CAL,)  LIQD Place 1,000 mLs into feeding tube continuous. At 60 ml/hr from 7pm to 7 am 30000 mL 6  . ondansetron (ZOFRAN) 8 MG tablet Take 1 tablet (8 mg total) by mouth 2 (two) times daily as needed for refractory nausea / vomiting. Start on day 3 after chemo. 30 tablet 1  . oxyCODONE (ROXICODONE) 5 MG/5ML solution Take 5 mLs (5 mg total) by mouth every 4 (four) hours as needed for severe pain. 210 mL 0  . pantoprazole sodium (PROTONIX) 40 mg/20 mL PACK Take 20 mLs (40 mg total) by mouth 2 (two) times daily. 60 each 3  . PROAIR HFA 108 (90 Base) MCG/ACT inhaler INHALE 2 PUFFS INTO THE LUNGS EVERY 6 (SIX) HOURS AS NEEDED FOR WHEEZING OR SHORTNESS OF BREATH. 18 g 0   No current facility-administered medications for this visit.        Physical Exam: BP 104/68   Pulse 77   Ht 5\' 1"  (1.549 m)   Wt 142 lb (64.4 kg)   SpO2 97%   BMI 26.83 kg/m    General appearance: alert and cooperative Head: Normocephalic, without obvious abnormality, atraumatic Neck: no adenopathy, no carotid bruit, no JVD, supple, symmetrical, trachea midline and thyroid not enlarged, symmetric, no tenderness/mass/nodules Lymph nodes: Cervical, supraclavicular, and axillary nodes normal. Resp: clear to auscultation bilaterally Back: symmetric, no curvature. ROM normal. No CVA tenderness. Cardio: regular rate and rhythm, S1, S2 normal, no murmur, click, rub or gallop GI: soft, non-tender; bowel sounds normal; no masses,  no organomegaly Extremities: extremities normal, atraumatic, no cyanosis or edema and Homans sign is negative, no sign of DVT Neurologic: Grossly normal  Patient's jejunostomy tube was removed with out difficulty in its entirety, he was instructed in local wound care of the jejunostomy site.  Diagnostic Studies & Laboratory data:         Recent Radiology Findings: No results found.    I have independently reviewed the above radiology findings and reviewed findings  with the patient.  Recent Labs: Lab  Results  Component Value Date   WBC 10.3 04/26/2017   HGB 9.7 (L) 04/26/2017   HCT 30.1 (L) 04/26/2017   PLT 305 04/26/2017   GLUCOSE 133 (H) 04/26/2017   CHOL 157 10/27/2014   TRIG 83 10/27/2014   HDL 34 (L) 10/27/2014  LDLCALC 106 (H) 10/27/2014   ALT 57 04/23/2017   AST 32 04/23/2017   NA 133 (L) 04/26/2017   K 4.2 04/26/2017   CL 100 (L) 04/26/2017   CREATININE 0.49 (L) 04/26/2017   BUN 15 04/26/2017   CO2 26 04/26/2017   INR 0.91 04/12/2017   HGBA1C 5.7 (H) 10/27/2014   Wt Readings from Last 3 Encounters:  06/07/17 142 lb (64.4 kg)  05/17/17 143 lb (64.9 kg)  04/22/17 154 lb 8.7 oz (70.1 kg)      Assessment / Plan:   Patient doing well following transhiatal total esophagectomy in September, maintaining his weight on a p.o. Diet. Plan to see him back in 8 weeks with a follow-up chest x-ray. He has been instructed in local wound care for his jejunostomy site, tube was removed today He knows to avoid any heavy lifting for several months. He has been careful about his diet and continues to eat multiple small meals.  [name] 06/07/2017 1:20 PM

## 2017-06-08 ENCOUNTER — Encounter: Payer: Medicaid Other | Admitting: Cardiothoracic Surgery

## 2017-06-13 NOTE — Progress Notes (Signed)
[place]  [phone] Fax:([phone]  Clinic Follow-up Note   Patient Care Team: [name], MD as PCP - General (Family Medicine) 06/14/2017   CHIEF COMPLAINTS:  Follow up esophageal adenocarcinoma   Oncology History   Cancer Staging Esophageal cancer [facility]) Staging form: Esophagus - Adenocarcinoma, AJCC 8th Edition - Clinical stage from 10/26/2016: Stage III (cT3, cN1, cM0) - Signed by [name], MD on 11/05/2016       Malignant neoplasm of lower third of esophagus ([place])   09/21/2016 - 09/21/2016 Hospital Admission    esophageal pain and vomiting up blood      10/09/2016 Initial Diagnosis    Esophageal cancer ([place])      10/09/2016 Procedure    EGD 1. Partially obstructing, likely malignant esophageal tumor was found in the lower third of the esophagus. Multiple biopsies.  2. Mass visible during gastric retroflexion at GE junction 3. Otherwise normal stomach 4. Normal examined duodenum       10/09/2016 Pathology Results    Esophagus, biopsy, distal esophageal tumor (33-39) - SUSPICIOUS FOR ADENOCARCINOMA      10/12/2016 Imaging    CT CAP w Contrast IMPRESSION: Distal esophageal mass compatible with primary esophageal malignancy. There are 2 adjacent abnormal appearing subcentimeter paraesophageal lymph nodes which may represent nodal metastasis. Additionally there is a prominent nonspecific 8 mm upper abdominal lymph node.  No evidence for distant metastatic disease in the chest, abdomen or pelvis.      10/24/2016 PET scan    1. Markedly hypermetabolic distal esophageal lesion, compatible with malignancy. Adjacent small paraesophageal lymph nodes are abnormal by CT but cannot be resolved as separate structures from the hypermetabolic esophageal activity on the PET images. No hypermetabolism is demonstrated in the upper abdominal/gastrohepatic ligament lymph node although the small size of this lymph node may be below  threshold for detection on PET imaging. 2. No evidence for distant hypermetabolic metastatic disease in the neck, chest, abdomen, or pelvis.      10/26/2016 Pathology Results    Esophagogastric junction, biopsy, mass - INVASIVE ADENOCARCINOMA.      10/26/2016 Procedure    EUS showed uT3N1 disease, and biopsy confirmed adenocarcinoma       10/30/2016 - 12/07/2016 Radiation Therapy    Neoadjuvant radiation to esophageal cancer  Under the care of Dr. [name]      10/31/2016 - 12/07/2016 Neo-Adjuvant Chemotherapy    Weekly Carboplatin AUC 2 and taxol 50mg /m2 with concurrent radiation        12/14/2016 Imaging    CT AP W Contrast 12/14/16 IMPRESSION: 1. Mildly dilated short segment of proximal jejunum without bowel wall thickening or significant inflammatory changes. Findings may represent focal ileus or radiation enteritis. No evidence for obstruction. 2. Significantly improved appearance of the distal esophagus/proximal stomach with decreased wall thickening (1.1 cm from 2.3 cm), and smaller paraesophageal and gastrohepatic ligament lymph nodes. 3. No bowel obstruction.  Normal appendix.      01/18/2017 Imaging    CT CAP W Contrast 01/18/17 IMPRESSION: 1. Mild stable distal esophageal wall thickening likely due to radiation change. No findings for recurrent tumor. Small paraesophageal lymph nodes are also stable. 2. No findings for metastatic disease. 3. Stable mild/early emphysematous changes and age advanced atherosclerotic calcifications involving the thoracic and abdominal aorta and branch vessels.      04/12/2017 Imaging    CT Chest and Abdomen W Contrast 04/12/17  IMPRESSION: 1. Mild stable distal esophageal wall thickening. No findings for recurrent tumor.  2. Stable small mediastinal and left supraclavicular lymph nodes. 3. There is a tiny nodule within the medial right lower lobe measuring 3 mm. New from previous exam. Nonspecific. Attention on follow-up imaging  advise. 4.  Aortic Atherosclerosis (ICD10-I70.0). 5. Three vessel coronary artery calcification      04/16/2017 Surgery    VIDEO BRONCHOSCOPY, Transhiatal Total Esophagectomy, Esophagogastrostomy, pyloromotomy, Feeding Jejunostomy ESOPHAGECTOMY COMPLETE,Transhiatal total esophagectomy JEJUNOSTOMY,Feeding by Dr. [name] 04/16/17      04/16/2017 Pathology Results    Diagnosis 04/16/17  1. Omentum, resection for tumor - BENIGN ADIPOSE TISSUE CONSISTENT WITH OMENTUM. - NO EVIDENCE OF MALIGNANCY. 2. Esophagus, resection, w/ GE junction - FIBROSIS WITH PATCHY CHRONIC INFLAMMATION. - NO RESIDUAL CARCINOMA IDENTIFIED. - MARGINS NOT INVOLVED. - TWELVE LYMPH NODES WITH NO METASTATIC CARCINOMA (0/12).       HISTORY OF PRESENTING ILLNESS (10/16/2016):  Darrell Cunningham 56 y.o. male is here because of an esophageal mass that is suspicious for adenocarcinoma. The pt presented to the ED on 09/21/2016 for esophageal pain and vomiting up blood. He had been struggling with regurgitating food or drinks, difficulty swallowing, and throat pain for 3 weeks prior. He was referred by his PCP to GI who ordered him an EGD with dilation with Dr. [name] as soon as possible. The EGD and biopsy was performed on 10/09/2016, which showed suspicion of adenocarcinoma of the esophagus and a CT scan was ordered. The CT results demonstrated a distal esophageal mass compatible with primary esophageal malignancy. He is here to discuss treatment.   When he has trouble swallowing, he has to stand up and push his head back to get the food down. He has mostly eating oatmeal, Ensure, and other soft foods. He drinks 2-3 Ensure a day. He has cut out coffee and acidic foods to try to relieve his pain. It is not getting better, though. He isn't able to eat much due to this pain and difficulty swallowing. He does not take any pain medication. He has lost about 13 pounds. He also has some left sided abdominal pain. At night, he has a lot of  acid reflux and sputum production. Denies constipation, diarrhea, nausea, SOB, or any other concerns.   He has a history of cerebral palsy on his right side. He has some back pain due to a curvature in his spine. History of asthma. His grandmother had breast cancer and colon cancer. His sister passed of breast cancer at age 47. His aunt had ovarian cancer. No other family history of cancer. He quit smoking 4 years ago. 38 pack years, 1-1.5 ppd. He lives at home with his 1 y.o. Daughter. His parents live about 15 minutes away.   CURRENT THERAPY: observation   INTERVAL HISTORY:   The patient returns for follow up post surgery. He presents to the clinic today noting his surgery went well. He is still having issues with his throat and he is having to learn how to eat again. He was in the hospital for 2 weeks. He has coughing with clear white phlegm. He denies any fever. He no longer has feeding tube removed 1-2 weeks ago. He is eating when he can and soft food diet to try to keep it down. He also drinks boost. He lost 10 pounds. He saw his cardiologist and he was on metoprolol.  He has been experiencing muscle spasm at night in his extremities. He has not had his port removed. He will contact Dr. [name] about his throat and follow up with rad/onc.  MEDICAL HISTORY:  Past Medical History:  Diagnosis Date  . Allergy   . Arthritis   . Asthma    as a child  . Cancer ([place])   . 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 Dysphagia   . Encounter for nonprocreative genetic counseling 10/31/2016   Mr. Shaddix underwent genetic counseling for hereditary cancer syndromes on 10/31/2016. Though he is a candidate for genetic testing, he declines at this time.  Marland Kitchen GERD (gastroesophageal reflux disease)   . Hyperlipidemia   . Neuromuscular disorder ([place])   . Pneumonia 4 yrs ago    SURGICAL HISTORY: Past Surgical History:  Procedure Laterality Date  . COMPLETE ESOPHAGECTOMY N/A 04/16/2017    Procedure: ESOPHAGECTOMY COMPLETE,Transhiatal total esophagectomy;  Surgeon: [name], MD;  Location: [facility] OR;  Service: Thoracic;  Laterality: N/A;  . EUS N/A 10/26/2016   Procedure: UPPER ENDOSCOPIC ULTRASOUND (EUS) LINEAR;  Surgeon: [name], MD;  Location: [facility] ENDOSCOPY;  Service: Endoscopy;  Laterality: N/A;  . EYE SURGERY Bilateral age 75    for cross eyes  . IR FLUORO GUIDE PORT INSERTION RIGHT  11/08/2016  . IR US GUIDE VASC ACCESS RIGHT  11/08/2016  . JEJUNOSTOMY N/A 04/16/2017   Procedure: Donney Rankins;  Surgeon: [name], MD;  Location: [facility];  Service: Thoracic;  Laterality: N/A;  . MOUTH SURGERY    . VIDEO BRONCHOSCOPY N/A 04/16/2017   Procedure: VIDEO BRONCHOSCOPY, Transhiatal Total Esophagectomy, Esophagogastrostomy, pyloromotomy, Feeding Jejunostomy;  Surgeon: [name], MD;  Location: [facility] OR;  Service: Thoracic;  Laterality: N/A;    SOCIAL HISTORY: Social History   Socioeconomic History  . Marital status: Legally Separated    Spouse name: Not on file  . Number of children: Not on file  . Years of education: Not on file  . Highest education level: Not on file  Social Needs  . Financial resource strain: Not on file  . Food insecurity - worry: Not on file  . Food insecurity - inability: Not on file  . Transportation needs - medical: Not on file  . Transportation needs - non-medical: Not on file  Occupational History  . Not on file  Tobacco Use  . Smoking status: Former Smoker    Packs/day: 1.00    Years: 38.00    Pack years: 38.00    Types: Cigarettes    Start date: 10/31/2012    Last attempt to quit: 10/31/2012    Years since quitting: 4.6  . Smokeless tobacco: Never Used  Substance and Sexual Activity  . Alcohol use: No    Alcohol/week: 0.0 oz  . Drug use: No  . Sexual activity: Not on file  Other Topics Concern  . Not on file  Social History Narrative  . Not on file    FAMILY HISTORY: Family History  Problem Relation  Age of Onset  . Colon cancer Maternal Grandmother 6  . Breast cancer Maternal Grandmother 55  . Breast cancer Sister 62       Deceased at 34 of breast cancer  . Ovarian cancer Maternal Aunt   . Stomach cancer Neg Hx   . Esophageal cancer Neg Hx     ALLERGIES:  is allergic to penicillins.  MEDICATIONS:  Current Outpatient Medications  Medication Sig Dispense Refill  . acetaminophen (TYLENOL) 160 MG/5ML solution Take 10.2 mLs (325 mg total) by mouth every 4 (four) hours as needed for moderate pain. 120 mL 0  . fluticasone (FLONASE) 50 MCG/ACT nasal  spray Place 1 spray into both nostrils daily as needed for allergies or rhinitis.    Marland Kitchen ipratropium-albuterol (DUONEB) 0.5-2.5 (3) MG/3ML SOLN Take 3 mLs by nebulization every 6 (six) hours as needed. (Patient taking differently: Take 3 mLs by nebulization every 6 (six) hours as needed (for wheezing/shortness of breath). ) 360 mL 0  . lactose free nutrition (BOOST) LIQD Take 237 mLs 2 (two) times daily between meals by mouth.    . lidocaine-prilocaine (EMLA) cream Apply 1 application topically as needed. Apply to portacath  1 1/2 hours - 2 hours prior to procedures as needed. 30 g 1  . metoprolol tartrate (LOPRESSOR) 25 MG tablet Take 0.5 tablets (12.5 mg total) by mouth 2 (two) times daily. 60 tablet 1  . oxyCODONE (ROXICODONE) 5 MG/5ML solution Take 5 mLs (5 mg total) by mouth every 4 (four) hours as needed for severe pain. 210 mL 0  . PROAIR HFA 108 (90 Base) MCG/ACT inhaler INHALE 2 PUFFS INTO THE LUNGS EVERY 6 (SIX) HOURS AS NEEDED FOR WHEEZING OR SHORTNESS OF BREATH. 18 g 0  . cyclobenzaprine (FLEXERIL) 5 MG tablet Take 1 tablet (5 mg total) 3 (three) times daily as needed by mouth for muscle spasms. 30 tablet 0  . ondansetron (ZOFRAN) 8 MG tablet Take 1 tablet (8 mg total) by mouth 2 (two) times daily as needed for refractory nausea / vomiting. Start on day 3 after chemo. (Patient not taking: Reported on 06/14/2017) 30 tablet 1  .  pantoprazole sodium (PROTONIX) 40 mg/20 mL PACK Take 20 mLs (40 mg total) by mouth 2 (two) times daily. (Patient not taking: Reported on 06/14/2017) 60 each 3   No current facility-administered medications for this visit.    REVIEW OF SYSTEMS:  Constitutional: Denies fevers, chills or abnormal night sweats (+) loss of weight  Eyes: Denies blurriness of vision, double vision or watery eyes Ears, nose, mouth, throat, and face: Denies mucositis or sore throat  (+) trouble swallowing solids Respiratory: Denies dyspnea or wheezes (+) cough with phlegm  Cardiovascular: Denies palpitation, chest discomfort or lower extremity swelling Gastrointestinal:  Denies nausea, heartburn or change in bowel habits  Skin: Denies abnormal skin rashes  Lymphatics: Denies new lymphadenopathy or easy bruising Neurological:Denies numbness, tingling or new weaknesses  MSK: (+) muscle spasms in extremities Musculoskeletal: Negative Behavioral/Psych: Mood is stable, no new changes  All other systems were reviewed with the patient and are negative.  PHYSICAL EXAMINATION: ECOG PERFORMANCE STATUS: 1 - Symptomatic but completely ambulatory  Vitals:   06/14/17 0943  BP: 126/74  Pulse: 100  Resp: 18  Temp: 98.2 F (36.8 C)  SpO2: 100%   Filed Weights   06/14/17 0943  Weight: 131 lb 3.2 oz (59.5 kg)    GENERAL:alert, no distress and comfortable (+) limp from right sided cerebral palsy SKIN: skin color, texture, turgor are normal, no rashes or significant lesions EYES: normal, conjunctiva are pink and non-injected, sclera clear OROPHARYNX:no exudate, no erythema and lips, buccal mucosa, and tongue normal  NECK: supple, thyroid normal size, non-tender, without nodularity LYMPH:  no palpable lymphadenopathy in the cervical, axillary or inguinal LUNGS: clear to auscultation and percussion with normal breathing effort  HEART: regular rate & rhythm and no murmurs and no lower extremity edema ABDOMEN: (+) Midline  surgical incision healed very well Musculoskeletal:no cyanosis of digits and no clubbing  PSYCH: alert & oriented x 3 with fluent speech NEURO: no focal motor/sensory deficits  LABORATORY DATA:  I have reviewed the data  as listed CBC Latest Ref Rng & Units 06/14/2017 04/26/2017 04/23/2017  WBC 4.0 - 10.3 10e3/uL 5.6 10.3 10.0  Hemoglobin 13.0 - 17.1 g/dL 12.1(L) 9.7(L) 9.8(L)  Hematocrit 38.4 - 49.9 % 37.4(L) 30.1(L) 31.2(L)  Platelets 140 - 400 10e3/uL 191 305 247   CMP Latest Ref Rng & Units 06/14/2017 04/26/2017 04/24/2017  Glucose 70 - 140 mg/dl 101 133(H) -  BUN 7.0 - 26.0 mg/dL 10.2 15 -  Creatinine 0.7 - 1.3 mg/dL 0.7 0.49(L) 0.59(L)  Sodium 136 - 145 mEq/L 137 133(L) -  Potassium 3.5 - 5.1 mEq/L 3.9 4.2 -  Chloride 101 - 111 mmol/L - 100(L) -  CO2 22 - 29 mEq/L 22 26 -  Calcium 8.4 - 10.4 mg/dL 9.5 8.6(L) -  Total Protein 6.4 - 8.3 g/dL 7.4 - -  Total Bilirubin 0.20 - 1.20 mg/dL 0.38 - -  Alkaline Phos 40 - 150 U/L 132 - -  AST 5 - 34 U/L 22 - -  ALT 0 - 55 U/L 26 - -   PATHOLOGY:  Diagnosis 04/16/17  1. Omentum, resection for tumor - BENIGN ADIPOSE TISSUE CONSISTENT WITH OMENTUM. - NO EVIDENCE OF MALIGNANCY. 2. Esophagus, resection, w/ GE junction - FIBROSIS WITH PATCHY CHRONIC INFLAMMATION. - NO RESIDUAL CARCINOMA IDENTIFIED. - MARGINS NOT INVOLVED. - TWELVE LYMPH NODES WITH NO METASTATIC CARCINOMA (0/12). Microscopic Comment 2. ESOPHAGUS: Specimen: Esophagus with GE junction. Procedure: Esophagogastrectomy. Tumor Site: No residual tumor identified in resection specimen. Relationship of Tumor to esophagogastric junction: Area of fibrosis located at esophagogastric junction. Distance of tumor center from esophagogastric junction: N/A. Tumor Size Greatest dimension: N/A. Histologic Type: Invasive adenocarcinoma, see previous esophagogastric junction biopsy ([id number]). Histologic Grade: See [id number]. Microscopic Tumor Extension: No residual carcinoma in  resection specimen. Margins: Free of tumor. Proximal Margin: Free of tumor. Distal Margin: Free of tumor. Circumferential (Adventitial) Margin: Free of tumor. Deep Margin (endoscopic resection specimens): Free of tumor. If margins uninvolved - Distance of invasive carcinoma from closest margin: N/A. Specify margin: N/A. Treatment Effect: Present. Lymph-Vascular Invasion: Not identified. Perineural Invasion: Not identified. Lymph nodes: number examined 12; number positive: 0, see comment. TNM: ypT0, ypN0. Ancillary studies: N/A. 1 of 3 FINAL for JAKORI, BURKETT [phone]) Microscopic Comment(continued) Comments: There is a 3 cm area at the gastroesophageal junction which shows fibrosis with hyperemia and patchy chronic inflammation. No residual carcinoma is identified. There are numerous foamy histiocytes in areas of fibrosis with occasional giant cells in six of the twelve lymph nodes. No viable metastatic carcinoma is identified in any of the twelve lymph nodes. (JDP:ah 04/18/17)   Diagnosis 10/26/2016 Esophagogastric junction, biopsy, mass - INVASIVE ADENOCARCINOMA.  Diagnosis 10/09/2016 Esophagus, biopsy, distal esophageal tumor (33-39) - SUSPICIOUS FOR ADENOCARCINOMA, SEE COMMENT.Marland Kitchen  RADIOGRAPHIC STUDIES: I have personally reviewed the radiological images as listed and agreed with the findings in the report.   CT Chest and Abdomen W Contrast 04/12/17  IMPRESSION: 1. Mild stable distal esophageal wall thickening. No findings for recurrent tumor. 2. Stable small mediastinal and left supraclavicular lymph nodes. 3. There is a tiny nodule within the medial right lower lobe measuring 3 mm. New from previous exam. Nonspecific. Attention on follow-up imaging advise. 4.  Aortic Atherosclerosis (ICD10-I70.0). 5. Three vessel coronary artery calcification   CT CAP W Contrast 01/18/17 IMPRESSION: 1. Mild stable distal esophageal wall thickening likely due to radiation change.  No findings for recurrent tumor. Small paraesophageal lymph nodes are also stable. 2. No findings for metastatic disease. 3. Stable mild/early emphysematous  changes and age advanced atherosclerotic calcifications involving the thoracic and abdominal aorta and branch vessels.   CT AP W Contrast 12/14/16 IMPRESSION: 1. Mildly dilated short segment of proximal jejunum without bowel wall thickening or significant inflammatory changes. Findings may represent focal ileus or radiation enteritis. No evidence for obstruction. 2. Significantly improved appearance of the distal esophagus/proximal stomach with decreased wall thickening (1.1 cm from 2.3 cm), and smaller paraesophageal and gastrohepatic ligament lymph nodes. 3. No bowel obstruction.  Normal appendix.  PET 10/24/2016 IMPRESSION: 1. Markedly hypermetabolic distal esophageal lesion, compatible with malignancy. Adjacent small paraesophageal lymph nodes are abnormal by CT but cannot be resolved as separate structures from the hypermetabolic esophageal activity on the PET images. No hypermetabolism is demonstrated in the upper abdominal/gastrohepatic ligament lymph node although the small size of this lymph node may be below threshold for detection on PET imaging. 2. No evidence for distant hypermetabolic metastatic disease in the neck, chest, abdomen, or pelvis.  CT CAP w Contrast 10/12/2016 IMPRESSION: Distal esophageal mass compatible with primary esophageal malignancy. There are 2 adjacent abnormal appearing subcentimeter paraesophageal lymph nodes which may represent nodal metastasis. Additionally there is a prominent nonspecific 8 mm upper abdominal lymph node.  No evidence for distant metastatic disease in the chest, abdomen or pelvis.  EGD 10/09/2016   ASSESSMENT & PLAN:  Asberry is a 56 y.o. Caucasian male with PMH cerebral palsy and heavy smoking, who presents with dysphagia, odynophagia and weight loss   1. Low  esophageal Adenocarcinoma, cT3N1M0, stage III, ypT0N0  -I previously reviewed the imaging and biopsy pathology results with the patient in detail. Although his biopsy pathology was not definitive for malignancy, based on his endoscopy findings, this is most consistent with esophageal adenocarcinoma. -I previously reviewed his PET scan images with patient in person, which showed intense hypermetabolic esophageal cancer, no other distant metastasis -I reviewed the EUS and re-biopsy results, which showed T3N1 disease, and biopsy confirmed adenocarcinoma -We previously reviewed the standard care for locally advanced esophageal cancer, including neoadjuvant chemoradiation, followed by esophagectomy.  -He was previously referred to see thoracic surgeon Dr. Pia Mau. -He has completed neoadjuvant concurrent chemoradiation with weekly carbo and Taxol, tolerated well overall  -His dysphagia has significantly improved, odynophagia has resolved.  -12/14/16 CT abdomen scan reviewed and showed good response in shrinking of tumor.  -He underwent surgery with Dr. Johney Maine on 04/16/17. He had complete response to chemo, radiation, no residual cancers on the surgical samples.  Which is a good prognostic factor.  -This does not rule out microscopic disease and the risk of recurrence. We again discussed this and I recommend him for close follow-up.  I will check with [facility] to see if these do have the adjuvant immunotherapy trial open and refer him if he is eligible. He agrees. I will send referral of still available.  -Post surgery I suggest small more frequent meals with a soft food and liquid diet. I encouraged him to drink ensure boost to help again his weight back. I will refer him to our dietician for more help. -f/u in 3 months with repeat scan surveillance  2. History of heavy smoking -He has quit smoking completely, history of 40 pack year.   3. Muscle Spasms in extremities  -I suggest magnesium or calcium  supplement OTC for his muscle spasms.  -I Prescribed Flexeril to take as needed at night. I advices him not to take before driving as this can cause drowsiness.   4.  Malnutrition and weight loss -  He has lost about 10 pounds since  is feeding tube was removed.  He eats small meals. -I will ask our dietitian to follow-up on him  Plan: -Prescribe Flexeril to take as needed at night -I suggest magnesium or calcium supplement OTC for his msucle spasms.  -Send [facility] referral for Immunotherapy trial if available  -Contact dietician to speak with pt about new diet plan  -F/u in 3 months with lab, flush and CT CAP with contrast one week before  -Port flush in 6 weeks     All questions were answered. The patient knows to call the clinic with any problems, questions or concerns.  I spent 20 minutes counseling the patient face to face. The total time spent in the appointment was 30 minutes and more than 50% was on counseling.   This document serves as a record of services personally performed by [name], MD. It was created on her behalf by Joslyn Devon, a trained medical scribe. The creation of this record is based on the scribe's personal observations and the provider's statements to them.    I have reviewed the above documentation for accuracy and completeness, and I agree with the above.    [name], MD 06/14/2017

## 2017-06-14 ENCOUNTER — Other Ambulatory Visit (HOSPITAL_BASED_OUTPATIENT_CLINIC_OR_DEPARTMENT_OTHER): Payer: Medicaid Other

## 2017-06-14 ENCOUNTER — Ambulatory Visit (HOSPITAL_BASED_OUTPATIENT_CLINIC_OR_DEPARTMENT_OTHER): Payer: Medicaid Other | Admitting: Hematology

## 2017-06-14 ENCOUNTER — Ambulatory Visit (HOSPITAL_BASED_OUTPATIENT_CLINIC_OR_DEPARTMENT_OTHER): Payer: Medicaid Other

## 2017-06-14 ENCOUNTER — Encounter: Payer: Self-pay | Admitting: Hematology

## 2017-06-14 ENCOUNTER — Telehealth: Payer: Self-pay | Admitting: Hematology

## 2017-06-14 VITALS — BP 126/74 | HR 100 | Temp 98.2°F | Resp 18 | Ht 61.0 in | Wt 131.2 lb

## 2017-06-14 DIAGNOSIS — E46 Unspecified protein-calorie malnutrition: Secondary | ICD-10-CM

## 2017-06-14 DIAGNOSIS — M62838 Other muscle spasm: Secondary | ICD-10-CM | POA: Diagnosis not present

## 2017-06-14 DIAGNOSIS — C155 Malignant neoplasm of lower third of esophagus: Secondary | ICD-10-CM

## 2017-06-14 DIAGNOSIS — R634 Abnormal weight loss: Secondary | ICD-10-CM

## 2017-06-14 DIAGNOSIS — D638 Anemia in other chronic diseases classified elsewhere: Secondary | ICD-10-CM

## 2017-06-14 DIAGNOSIS — Z95828 Presence of other vascular implants and grafts: Secondary | ICD-10-CM

## 2017-06-14 LAB — COMPREHENSIVE METABOLIC PANEL
ALBUMIN: 3.2 g/dL — AB (ref 3.5–5.0)
ALK PHOS: 132 U/L (ref 40–150)
ALT: 26 U/L (ref 0–55)
ANION GAP: 9 meq/L (ref 3–11)
AST: 22 U/L (ref 5–34)
BILIRUBIN TOTAL: 0.38 mg/dL (ref 0.20–1.20)
BUN: 10.2 mg/dL (ref 7.0–26.0)
CO2: 22 mEq/L (ref 22–29)
CREATININE: 0.7 mg/dL (ref 0.7–1.3)
Calcium: 9.5 mg/dL (ref 8.4–10.4)
Chloride: 106 mEq/L (ref 98–109)
GLUCOSE: 101 mg/dL (ref 70–140)
Potassium: 3.9 mEq/L (ref 3.5–5.1)
SODIUM: 137 meq/L (ref 136–145)
TOTAL PROTEIN: 7.4 g/dL (ref 6.4–8.3)

## 2017-06-14 LAB — CBC WITH DIFFERENTIAL/PLATELET
BASO%: 0.4 % (ref 0.0–2.0)
Basophils Absolute: 0 10*3/uL (ref 0.0–0.1)
EOS%: 4.8 % (ref 0.0–7.0)
Eosinophils Absolute: 0.3 10*3/uL (ref 0.0–0.5)
HEMATOCRIT: 37.4 % — AB (ref 38.4–49.9)
HGB: 12.1 g/dL — ABNORMAL LOW (ref 13.0–17.1)
LYMPH#: 0.8 10*3/uL — AB (ref 0.9–3.3)
LYMPH%: 14.6 % (ref 14.0–49.0)
MCH: 28.2 pg (ref 27.2–33.4)
MCHC: 32.4 g/dL (ref 32.0–36.0)
MCV: 87.2 fL (ref 79.3–98.0)
MONO#: 0.6 10*3/uL (ref 0.1–0.9)
MONO%: 10.3 % (ref 0.0–14.0)
NEUT#: 3.9 10*3/uL (ref 1.5–6.5)
NEUT%: 69.9 % (ref 39.0–75.0)
Platelets: 191 10*3/uL (ref 140–400)
RBC: 4.29 10*6/uL (ref 4.20–5.82)
RDW: 15.5 % — ABNORMAL HIGH (ref 11.0–14.6)
WBC: 5.6 10*3/uL (ref 4.0–10.3)

## 2017-06-14 LAB — CEA (IN HOUSE-CHCC): CEA (CHCC-IN HOUSE): 1.51 ng/mL (ref 0.00–5.00)

## 2017-06-14 MED ORDER — SODIUM CHLORIDE 0.9% FLUSH
10.0000 mL | Freq: Once | INTRAVENOUS | Status: AC
  Administered 2017-06-14: 10 mL
  Filled 2017-06-14: qty 10

## 2017-06-14 MED ORDER — CYCLOBENZAPRINE HCL 5 MG PO TABS: 5.0000 mg | ORAL_TABLET | Freq: Three times a day (TID) | ORAL | 0 refills | Status: DC | PRN

## 2017-06-14 MED ORDER — HEPARIN SOD (PORK) LOCK FLUSH 100 UNIT/ML IV SOLN
500.0000 [IU] | Freq: Once | INTRAVENOUS | Status: AC
  Administered 2017-06-14: 500 [IU]
  Filled 2017-06-14: qty 5

## 2017-06-14 NOTE — Telephone Encounter (Signed)
Gave avs and calendar for December and February 2019 °

## 2017-07-02 NOTE — Progress Notes (Signed)
Darrell Cunningham 56 y.o. Man with probable distal esophageal cancer radiation completed 12-07-16,6 month FU.  Pain:No Skin:Normal color to chest. Nausea/Vomiting:No Swallowing problems/pain/swallowing dificulity: Food gets stuck in his throat, has to be careful when drinking liquids. Appetite:Poor drinking boost eating soft foods. Weight: Down 4 pounds.2 ozs. Wt Readings from Last 3 Encounters:  06/14/17 131 lb 3.2 oz (59.5 kg)  06/07/17 142 lb (64.4 kg)  05/17/17 143 lb (64.9 kg)                                                                     Fatigue:Reports feeling fatigue. 06-14-17 Saw Dr. Burr Medico Prescribe Flexeril to take as needed at night -I suggest magnesium or calcium supplement OTC for his msucle spasms.  -Send [facility] referral for Immunotherapy trial if available  -Contact dietician to speak with pt about new diet plan  -F/u in 3 months with lab, flush and CT CAP with contrast one week before  -Port flush in 6 weeks  BP 96/63 (BP Location: Right Arm, Patient Position: Sitting, Cuff Size: Normal)   Pulse 85   Temp 97.9 F (36.6 C) (Oral)   Resp 18   Ht 5\' 1"  (1.549 m)   Wt 127 lb 3.2 oz (57.7 kg)   SpO2 100%   BMI 24.03 kg/m

## 2017-07-03 ENCOUNTER — Other Ambulatory Visit: Payer: Self-pay | Admitting: Hematology

## 2017-07-05 ENCOUNTER — Encounter: Payer: Self-pay | Admitting: Radiation Oncology

## 2017-07-05 ENCOUNTER — Ambulatory Visit
Admission: RE | Admit: 2017-07-05 | Discharge: 2017-07-05 | Disposition: A | Discharge status: Home / Self Care | Payer: Medicaid Other | Source: Ambulatory Visit | Attending: Radiation Oncology | Admitting: Radiation Oncology

## 2017-07-05 ENCOUNTER — Other Ambulatory Visit: Payer: Self-pay

## 2017-07-05 VITALS — BP 96/63 | HR 85 | Temp 97.9°F | Resp 18 | Ht 61.0 in | Wt 127.2 lb

## 2017-07-05 DIAGNOSIS — Z79899 Other long term (current) drug therapy: Secondary | ICD-10-CM | POA: Insufficient documentation

## 2017-07-05 DIAGNOSIS — J45909 Unspecified asthma, uncomplicated: Secondary | ICD-10-CM | POA: Insufficient documentation

## 2017-07-05 DIAGNOSIS — Z800 Family history of malignant neoplasm of digestive organs: Secondary | ICD-10-CM | POA: Insufficient documentation

## 2017-07-05 DIAGNOSIS — Z880 Allergy status to penicillin: Secondary | ICD-10-CM | POA: Insufficient documentation

## 2017-07-05 DIAGNOSIS — Z87891 Personal history of nicotine dependence: Secondary | ICD-10-CM | POA: Diagnosis not present

## 2017-07-05 DIAGNOSIS — E785 Hyperlipidemia, unspecified: Secondary | ICD-10-CM | POA: Diagnosis not present

## 2017-07-05 DIAGNOSIS — Z8041 Family history of malignant neoplasm of ovary: Secondary | ICD-10-CM | POA: Diagnosis not present

## 2017-07-05 DIAGNOSIS — Z8701 Personal history of pneumonia (recurrent): Secondary | ICD-10-CM | POA: Diagnosis not present

## 2017-07-05 DIAGNOSIS — G809 Cerebral palsy, unspecified: Secondary | ICD-10-CM | POA: Insufficient documentation

## 2017-07-05 DIAGNOSIS — C155 Malignant neoplasm of lower third of esophagus: Secondary | ICD-10-CM

## 2017-07-05 DIAGNOSIS — Z9889 Other specified postprocedural states: Secondary | ICD-10-CM | POA: Insufficient documentation

## 2017-07-05 DIAGNOSIS — R1310 Dysphagia, unspecified: Secondary | ICD-10-CM | POA: Insufficient documentation

## 2017-07-05 DIAGNOSIS — K219 Gastro-esophageal reflux disease without esophagitis: Secondary | ICD-10-CM | POA: Diagnosis not present

## 2017-07-05 DIAGNOSIS — Z803 Family history of malignant neoplasm of breast: Secondary | ICD-10-CM | POA: Diagnosis not present

## 2017-07-05 DIAGNOSIS — Z8501 Personal history of malignant neoplasm of esophagus: Secondary | ICD-10-CM | POA: Diagnosis not present

## 2017-07-05 DIAGNOSIS — Z923 Personal history of irradiation: Secondary | ICD-10-CM | POA: Insufficient documentation

## 2017-07-05 DIAGNOSIS — Z79891 Long term (current) use of opiate analgesic: Secondary | ICD-10-CM | POA: Insufficient documentation

## 2017-07-05 NOTE — Progress Notes (Signed)
Radiation Oncology         [phone] ________________________________  Name: Darrell Cunningham MRN: [id number]  Date: 07/05/2017  DOB: October 08, 1960  Follow Up Note  CC: Darrell Frizzle, MD  Darrell Merle, MD  Diagnosis:   Stage III, cT3N1M0 adenocarcinoma of the GE junction/esophagus.  Interval Since Last Radiation: 8 months  10/30/16-12/07/16 1. Esophagus/ 45 Gy in 25 fractions 2. Esophagus boost/ 5.4 Gy in 3 fractions   Narrative:  The patient returns today for routine follow-up for a history of stage III adenocarcinoma of the esophagus.  The patient did well during treatment but did have modest weight loss and esophagitis. He also developed small bowel distention following radiotherapy which improved with conservative management. He had a good response radiographically and underwent transhiatal total  Esophagectomy, esophogogastrostomy, and pyloromyotomy  on 04/16/17 and no residual cancer was seen in the specimen and all of the 12 nodes removed were also negative. He has been followed in surveillance since. It appears he may be a candidate for Cunningham immunotherapy trial at [facility] and is waiting to hear back about this. He is due to see Dr. Servando Cunningham in January 2019, and Dr. Burr Cunningham in February 2019.   On review of systems, the patient reports that he  is doing okay. He reports that he is still struggling with eating. He had a feeding tube placed at the time of surgery, had this removed about a month ago. He reports that in that time frame, he's developed a tightening feeling in his esophagus when he swallows and reports that he continue to have trouble with knowing if he's satiated. If he eats too much he regurgitates. Currently his diet is only very soft or liquid foods. He is concerned that he's not doing the correct things to rehabilite himself. He has not met with a nutritionist recently, and requests help with trying to find his new normal. He continues to sleep upright because of reflux. He is  not taking protonix because he things that this is more of a saliva issue.  He denies any chest pain, shortness of breath, fevers, chills, night sweats. He denies any bowel or bladder disturbances, and denies abdominal pain, nausea or vomiting, but regurgitates often. He denies any new musculoskeletal or joint aches or pains, new skin lesions or concerns. A complete review of systems is obtained and is otherwise negative.  Past Medical History:  Past Medical History:  Diagnosis Date  . Allergy   . Arthritis   . Asthma    as a child  . Cancer ([place])   . 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 Dysphagia   . Encounter for nonprocreative genetic counseling 10/31/2016   Darrell Cunningham underwent genetic counseling for hereditary cancer syndromes on 10/31/2016. Though he is a candidate for genetic testing, he declines at this time.  Marland Kitchen GERD (gastroesophageal reflux disease)   . Hyperlipidemia   . Neuromuscular disorder ([place])   . Pneumonia 4 yrs ago    Past Surgical History: Past Surgical History:  Procedure Laterality Date  . COMPLETE ESOPHAGECTOMY N/A 04/16/2017   Procedure: ESOPHAGECTOMY COMPLETE,Transhiatal total esophagectomy;  Surgeon: [name], MD;  Location: [facility] OR;  Service: Thoracic;  Laterality: N/A;  . EUS N/A 10/26/2016   Procedure: UPPER ENDOSCOPIC ULTRASOUND (EUS) LINEAR;  Surgeon: [name], MD;  Location: [facility] ENDOSCOPY;  Service: Endoscopy;  Laterality: N/A;  . EYE SURGERY Bilateral age 25    for cross eyes  . IR  FLUORO GUIDE PORT INSERTION RIGHT  11/08/2016  . IR US GUIDE VASC ACCESS RIGHT  11/08/2016  . JEJUNOSTOMY N/A 04/16/2017   Procedure: Donney Rankins;  Surgeon: [name], MD;  Location: [place];  Service: Thoracic;  Laterality: N/A;  . MOUTH SURGERY    . VIDEO BRONCHOSCOPY N/A 04/16/2017   Procedure: VIDEO BRONCHOSCOPY, Transhiatal Total Esophagectomy, Esophagogastrostomy, pyloromotomy, Feeding Jejunostomy;  Surgeon: [name], MD;   Location: [place] OR;  Service: Thoracic;  Laterality: N/A;    Social History:  Social History   Socioeconomic History  . Marital status: Legally Separated    Spouse name: Not on file  . Number of children: Not on file  . Years of education: Not on file  . Highest education level: Not on file  Social Needs  . Financial resource strain: Not on file  . Food insecurity - worry: Not on file  . Food insecurity - inability: Not on file  . Transportation needs - medical: Not on file  . Transportation needs - non-medical: Not on file  Occupational History  . Not on file  Tobacco Use  . Smoking status: Former Smoker    Packs/day: 1.00    Years: 38.00    Pack years: 38.00    Types: Cigarettes    Start date: 10/31/2012    Last attempt to quit: 10/31/2012    Years since quitting: 4.6  . Smokeless tobacco: Never Used  Substance and Sexual Activity  . Alcohol use: No    Alcohol/week: 0.0 oz  . Drug use: No  . Sexual activity: Not on file  Other Topics Concern  . Not on file  Social History Narrative  . Not on file  The patient is single. He lives with his parents and his daughter.   Family History: Family History  Problem Relation Age of Onset  . Colon cancer Maternal Grandmother 35  . Breast cancer Maternal Grandmother 18  . Breast cancer Sister 37       Deceased at 32 of breast cancer  . Ovarian cancer Maternal Aunt   . Stomach cancer Neg Hx   . Esophageal cancer Neg Hx      ALLERGIES:  is allergic to penicillins.  Meds: Current Outpatient Medications  Medication Sig Dispense Refill  . acetaminophen (TYLENOL) 160 MG/5ML solution Take 10.2 mLs (325 mg total) by mouth every 4 (four) hours as needed for moderate pain. 120 mL 0  . cyclobenzaprine (FLEXERIL) 5 MG tablet TAKE 1 TABLET (5 MG TOTAL) 3 (THREE) TIMES DAILY AS NEEDED BY MOUTH FOR MUSCLE SPASMS. 30 tablet 0  . lactose free nutrition (BOOST) LIQD Take 237 mLs 2 (two) times daily between meals by mouth.    .  lidocaine-prilocaine (EMLA) cream Apply 1 application topically as needed. Apply to portacath  1 1/2 hours - 2 hours prior to procedures as needed. 30 g 1  . metoprolol tartrate (LOPRESSOR) 25 MG tablet Take 0.5 tablets (12.5 mg total) by mouth 2 (two) times daily. 60 tablet 1  . fluticasone (FLONASE) 50 MCG/ACT nasal spray Place 1 spray into both nostrils daily as needed for allergies or rhinitis.    Marland Kitchen ipratropium-albuterol (DUONEB) 0.5-2.5 (3) MG/3ML SOLN Take 3 mLs by nebulization every 6 (six) hours as needed. (Patient not taking: Reported on 07/05/2017) 360 mL 0  . ondansetron (ZOFRAN) 8 MG tablet Take 1 tablet (8 mg total) by mouth 2 (two) times daily as needed for refractory nausea / vomiting. Start on day 3 after chemo. (Patient  not taking: Reported on 06/14/2017) 30 tablet 1  . oxyCODONE (ROXICODONE) 5 MG/5ML solution Take 5 mLs (5 mg total) by mouth every 4 (four) hours as needed for severe pain. (Patient not taking: Reported on 07/05/2017) 210 mL 0  . pantoprazole sodium (PROTONIX) 40 mg/20 mL PACK Take 20 mLs (40 mg total) by mouth 2 (two) times daily. (Patient not taking: Reported on 06/14/2017) 60 each 3  . PROAIR HFA 108 (90 Base) MCG/ACT inhaler INHALE 2 PUFFS INTO THE LUNGS EVERY 6 (SIX) HOURS AS NEEDED FOR WHEEZING OR SHORTNESS OF BREATH. (Patient not taking: Reported on 07/05/2017) 18 g 0   No current facility-administered medications for this encounter.     Physical Findings:  height is _0  (1.549 m) and weight is 127 lb 3.2 oz (57.7 kg). His oral temperature is 97.9 F (36.6 C). His blood pressure is 96/63 and his pulse is 85. His respiration is 18 and oxygen saturation is 100%.  Pain Assessment Pain Score: 0-No pain/10 In general this is a well appearing caucasian male in no acute distress. He is alert and oriented x4 and appropriate throughout the examination. HEENT reveals that the patient is normocephalic, atraumatic. EOMs are intact. PERRLA. Skin is intact without any  evidence of gross lesions. Cardiovascular exam reveals a regular rate and rhythm, no clicks rubs or murmurs are auscultated. Chest is clear to auscultation bilaterally. Lymphatic assessment is performed and does not reveal any adenopathy in the cervical, supraclavicular, axillary, or inguinal chains. Abdomen reveals a well healed midline laparotomy incision as well as Cunningham incision that's well healed at the midline aspect of the base of neck. He has active bowel sounds in all quadrants and is intact. The abdomen is soft, non tender, non distended. Lower extremities are negative for pretibial pitting edema, deep calf tenderness, cyanosis or clubbing.  Lab Findings: Lab Results  Component Value Date   WBC 5.6 06/14/2017   HGB 12.1 (L) 06/14/2017   HCT 37.4 (L) 06/14/2017   MCV 87.2 06/14/2017   PLT 191 06/14/2017     Radiographic Findings: No results found.  Impression/Plan: 1. Stage III, cT3N1M0 adenocarcinoma of the GE junction/esophagus with complete pathologic response. While clinically he is doing well on examination, and pathologically had Cunningham excellent response, he is having a difficult time in the transition from surgery back to normal eating. I'm not sure if he is having some stenotic changes of his anatomy causing his dysphagia. I will reach out to Dr. Servando Cunningham about this, to see if this is a possibility and if he needs to have dilatation, we can help coordinate that. I will also ask if there is a nurse navigator for these folks within the surgical office to help with education and nutritional counseling. Meanwhile we'll coordinate a visit with the nutritionist here at the cancer center. He will be due for CT imaging in the early spring, and I will plan to see him back in 6 months or sooner if needed.       Carola Rhine, PAC

## 2017-07-10 NOTE — Progress Notes (Signed)
Darrell Cunningham- Can you see about getting him in this week for nutrition??

## 2017-07-11 ENCOUNTER — Telehealth: Payer: Self-pay | Admitting: Radiation Oncology

## 2017-07-11 ENCOUNTER — Telehealth: Payer: Self-pay | Admitting: *Deleted

## 2017-07-11 DIAGNOSIS — C155 Malignant neoplasm of lower third of esophagus: Secondary | ICD-10-CM

## 2017-07-11 NOTE — Telephone Encounter (Signed)
CALLED PATIENT TO INFORM OF TEST FOR 07-12-17 - ARRIVAL TIME - 1:45 PM @ [facility], PT. TO BE NPO- 3 HRS. PRIOR TO TEST, SPOKE WITH PATIENT AND HE IS AWARE OF THIS TEST

## 2017-07-11 NOTE — Telephone Encounter (Addendum)
The patient called me to let me know that he hadn't heard back about seeing Dr. [name] and I called over to his office to speak to nursing and he will see the pt tomorrow. I've ordered a stat gastrografin swallow STAT to be done prior to the patient's appt tomorrow at 3pm with Dr. [name]

## 2017-07-12 ENCOUNTER — Telehealth: Payer: Self-pay | Admitting: *Deleted

## 2017-07-12 ENCOUNTER — Encounter: Payer: Self-pay | Admitting: Cardiothoracic Surgery

## 2017-07-12 ENCOUNTER — Telehealth: Payer: Self-pay | Admitting: Internal Medicine

## 2017-07-12 ENCOUNTER — Ambulatory Visit (INDEPENDENT_AMBULATORY_CARE_PROVIDER_SITE_OTHER): Payer: Medicaid Other | Admitting: Cardiothoracic Surgery

## 2017-07-12 ENCOUNTER — Other Ambulatory Visit: Payer: Self-pay

## 2017-07-12 ENCOUNTER — Ambulatory Visit (HOSPITAL_COMMUNITY)
Admission: RE | Admit: 2017-07-12 | Discharge: 2017-07-12 | Disposition: A | Discharge status: Home / Self Care | Payer: Medicaid Other | Source: Ambulatory Visit | Attending: Radiation Oncology | Admitting: Radiation Oncology

## 2017-07-12 VITALS — BP 110/72 | HR 94 | Ht 61.0 in | Wt 121.0 lb

## 2017-07-12 DIAGNOSIS — K222 Esophageal obstruction: Secondary | ICD-10-CM | POA: Diagnosis not present

## 2017-07-12 DIAGNOSIS — Z9221 Personal history of antineoplastic chemotherapy: Secondary | ICD-10-CM

## 2017-07-12 DIAGNOSIS — Z923 Personal history of irradiation: Secondary | ICD-10-CM

## 2017-07-12 DIAGNOSIS — C159 Malignant neoplasm of esophagus, unspecified: Secondary | ICD-10-CM | POA: Diagnosis not present

## 2017-07-12 DIAGNOSIS — C155 Malignant neoplasm of lower third of esophagus: Secondary | ICD-10-CM | POA: Insufficient documentation

## 2017-07-12 MED ORDER — IOPAMIDOL (ISOVUE-300) INJECTION 61%
150.0000 mL | Freq: Once | INTRAVENOUS | Status: AC | PRN
  Administered 2017-07-12: 150 mL via ORAL

## 2017-07-12 NOTE — Telephone Encounter (Signed)
-----   Message from [name], MD sent at 07/12/2017  4:03 PM EST ----- Regarding: needs EGD Pt needs EGD in the [facility] for dilation of anastomotic stricture Next week if possible ?730 on the 17th or 4:30 on the 20th?   JMP  ----- Message ----- From: [name], MD Sent: 07/12/2017   3:28 PM To: [name], MD  Please see this recent information about our joint patient. Ulice Dash, Can you get Mr Moreland in as soon as possible for endoscopy and dilation of anastomotic stricture. He under went transhiatal total esophagectomy about 3 months ago.  The preoperative radiation and chemo significantly down stage his disease, and now has a relatively good prognosis.  Thanks   Edward B.  Gerhardt MD [facility] [address]  [place] [address]        [phone] office        [phone] beeper

## 2017-07-12 NOTE — Progress Notes (Signed)
[address]       [place] [address]             [phone]                  Cire G Brissette [place] Medical Record #[id number] Date of Birth: [birth year]  Referring AO:[id number], [name], MD Primary Cardiology: Primary Care:Pickard, [name], MD  Chief Complaint:  Follow Up Visit 04/16/2017 OPERATIVE REPORT PREOPERATIVE DIAGNOSIS:  Clinical stage III adenocarcinoma of the distal esophagus. 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 PROCEDURES PERFORMED:  Bronchoscopy, transhiatal total esophagectomy with cervical esophagogastrostomy, pyloromyotomy, and feeding jejunostomy tube. SURGEON:  [name], MD.  Cancer Staging Malignant neoplasm of lower third of esophagus [facility]) Staging form: Esophagus - Adenocarcinoma, AJCC 8th Edition - Clinical stage from 10/26/2016: Stage III (cT3, cN1, cM0) - Signed by [name], MD on 11/05/2016 - Pathologic stage from 04/20/2017: Stage I (ypT0, pN0, cM0, GX) - Signed by [name], MD on 04/23/2017   History of Present Illness:      Patient returns to the office today for postop check after recent esophagectomy.  Since last seen the patient notes increasing difficulty with swallowing solid food.  He feels that it gets stuck in the neck.  At times he has some difficulty with liquids.  He has maintained his diet with liquid nutrition for the past week.  A Gastrografin swallow was performed prior to his visit in the office today.   Zubrod Score: At the time of surgery this patient's most appropriate activity status/level should be described as: [x]     0    Normal activity, no symptoms []     1    Restricted in physical strenuous activity but ambulatory, able to do out light work []     2    Ambulatory and capable of self care, unable to do work activities, up and about                 >50 % of waking hours                                                                                    []     3    Only limited self care, in bed greater than 50% of waking hours []     4    Completely disabled, no self care, confined to bed or chair []     5    Moribund  Social History   Tobacco Use  Smoking Status Former Smoker  . Packs/day: 1.00  . Years: 38.00  . Pack years: 38.00  . Types: Cigarettes  . Start date: 10/31/2012  . Last attempt to quit: 10/31/2012  . Years since quitting: 4.6  Smokeless Tobacco Never Used       Allergies  Allergen Reactions  . Penicillins Hives, Itching and Swelling    Has patient had a PCN reaction causing immediate rash, facial/tongue/throat swelling, SOB or lightheadedness with hypotension: No Has patient had a PCN reaction causing severe rash involving mucus membranes or skin necrosis: No Has patient had a PCN reaction  that required hospitalization No Has patient had a PCN reaction occurring within the last 10 years: No If all of the above answers are "NO", then may proceed with Cephalosporin use. Unknown Childhood reaction    Current Outpatient Medications  Medication Sig Dispense Refill  . acetaminophen (TYLENOL) 160 MG/5ML solution Take 10.2 mLs (325 mg total) by mouth every 4 (four) hours as needed for moderate pain. 120 mL 0  . cyclobenzaprine (FLEXERIL) 5 MG tablet TAKE 1 TABLET (5 MG TOTAL) 3 (THREE) TIMES DAILY AS NEEDED BY MOUTH FOR MUSCLE SPASMS. 30 tablet 0  . fluticasone (FLONASE) 50 MCG/ACT nasal spray Place 1 spray into both nostrils daily as needed for allergies or rhinitis.    Marland Kitchen lactose free nutrition (BOOST) LIQD Take 237 mLs 2 (two) times daily between meals by mouth.    . lidocaine-prilocaine (EMLA) cream Apply 1 application topically as needed. Apply to portacath  1 1/2 hours - 2 hours prior to procedures as needed. 30 g 1  . metoprolol tartrate (LOPRESSOR) 25 MG tablet Take 0.5 tablets (12.5 mg total) by mouth 2 (two) times daily. 60 tablet 1   No current facility-administered medications for this visit.       Physical Exam: BP 110/72 (BP Location: Left Arm, Patient Position: Sitting, Cuff Size: Normal)   Pulse 94   Ht 5\' 1"  (1.549 m)   Wt 121 lb (54.9 kg)   SpO2 98%   BMI 22.86 kg/m   General appearance: alert, cooperative, appears older than stated age and Patient has obviously lost weight since his last visit Head: Normocephalic, without obvious abnormality, atraumatic Neck: no adenopathy, no carotid bruit, no JVD, supple, symmetrical, trachea midline and thyroid not enlarged, symmetric, no tenderness/mass/nodules Lymph nodes: Cervical, supraclavicular, and axillary nodes normal. Resp: clear to auscultation bilaterally Back: symmetric, no curvature. ROM normal. No CVA tenderness. Cardio: regular rate and rhythm, S1, S2 normal, no murmur, click, rub or gallop GI: soft, non-tender; bowel sounds normal; no masses,  no organomegaly and J-tube site is now completely healed Extremities: extremities normal, atraumatic, no cyanosis or edema and Homans sign is negative, no sign of DVT Neurologic: Grossly normal   Diagnostic Studies & Laboratory data:         Recent Radiology Findings: Dg Esophagus W/water Sol Cm  Result Date: 07/12/2017 CLINICAL DATA:  Lower esophageal cancer status post gastric pull-through, with difficulty swallowing. EXAM: ESOPHOGRAM/BARIUM SWALLOW TECHNIQUE: Single contrast examination was performed using  thin barium. FLUOROSCOPY TIME:  Fluoroscopy Time:  1 minutes, 12 seconds Radiation Exposure Index (if provided by the fluoroscopic device): 14.8 mGy Number of Acquired Spot Images: 0 COMPARISON:  Chest CT 04/12/2017 FINDINGS: The pharyngeal phase of swallowing was normal. At the proximal anastomotic site there is a stricture like narrowing of the lumen down to 7 mm as shown on image 54/6, likely from scarring. The narrowing is focal, ring-like, and smooth. The esophagus proximal to this narrowing is dilated. Further distally there no findings of obstruction. IMPRESSION: 1.  Focal ring-like smooth stricturing at the upper thoracic proximal anastomotic site likely due to scarring. Electronically Signed   By: Van Clines M.D.   On: 07/12/2017 15:00      I have independently reviewed the above radiology findings and reviewed findings  with the patient.  Recent Labs: Lab Results  Component Value Date   WBC 5.6 06/14/2017   HGB 12.1 (L) 06/14/2017   HCT 37.4 (L) 06/14/2017   PLT 191 06/14/2017   GLUCOSE 101 06/14/2017  CHOL 157 10/27/2014   TRIG 83 10/27/2014   HDL 34 (L) 10/27/2014   LDLCALC 106 (H) 10/27/2014   ALT 26 06/14/2017   AST 22 06/14/2017   NA 137 06/14/2017   K 3.9 06/14/2017   CL 100 (L) 04/26/2017   CREATININE 0.7 06/14/2017   BUN 10.2 06/14/2017   CO2 22 06/14/2017   INR 0.91 04/12/2017   HGBA1C 5.7 (H) 10/27/2014   Wt Readings from Last 3 Encounters:  07/12/17 121 lb (54.9 kg)  07/05/17 127 lb 3.2 oz (57.7 kg)  06/14/17 131 lb 3.2 oz (59.5 kg)      Assessment / Plan:   Patient has signs and symptoms and radiographic evidence of cervical anastomotic stricture.  3 months ago the patient underwent transhiatal total esophagectomy with cervical esophagogastrostomy.  We will refer the patient back to gastroenterology as soon as possible for upper GI endoscopy and dilation of the cervical esophagus.  This may take several repeated dilatations to overcome the tendency of circular scar formation at the anastomosis. We plan to see the patient back in 1 month and follow-up    [name] 07/12/2017 3:07 PM

## 2017-07-12 NOTE — Telephone Encounter (Signed)
Pt scheduled to see Dr. [name] tomorrow at 3:45pm. Pt aware of appt.

## 2017-07-12 NOTE — Telephone Encounter (Signed)
Patient has been scheduled for endoscopy with dilation at [facility] on 07/16/17 at 730 am with 700 am arrival. He is scheduled for previsit on 07/13/17 at 400pm. I have spoken to patient and advised that the appointment he was scheduled for in office tomorrow at 345 with Dr [name] will be cancelled as Dr [name] says he can have a direct endoscopy. Patient verbalizes understanding and agrees with plan.

## 2017-07-13 ENCOUNTER — Ambulatory Visit: Payer: Medicaid Other | Admitting: Internal Medicine

## 2017-07-13 ENCOUNTER — Ambulatory Visit: Payer: Medicaid Other | Admitting: *Deleted

## 2017-07-13 VITALS — Ht 60.0 in | Wt 120.0 lb

## 2017-07-13 DIAGNOSIS — R1310 Dysphagia, unspecified: Secondary | ICD-10-CM

## 2017-07-13 DIAGNOSIS — K9189 Other postprocedural complications and disorders of digestive system: Secondary | ICD-10-CM

## 2017-07-13 NOTE — Progress Notes (Signed)
Patient denies any allergies to eggs or soy. Patient denies any problems with anesthesia/sedation. Patient denies any oxygen use at home. Patient denies taking any diet/weight loss medications or blood thinners.  

## 2017-07-16 ENCOUNTER — Other Ambulatory Visit: Payer: Self-pay

## 2017-07-16 ENCOUNTER — Ambulatory Visit (AMBULATORY_SURGERY_CENTER): Payer: Medicaid Other | Admitting: Internal Medicine

## 2017-07-16 ENCOUNTER — Encounter: Payer: Self-pay | Admitting: Internal Medicine

## 2017-07-16 VITALS — BP 91/59 | HR 73 | Temp 97.8°F | Resp 14 | Ht 60.0 in | Wt 120.0 lb

## 2017-07-16 DIAGNOSIS — K222 Esophageal obstruction: Secondary | ICD-10-CM | POA: Diagnosis not present

## 2017-07-16 DIAGNOSIS — R1310 Dysphagia, unspecified: Secondary | ICD-10-CM | POA: Diagnosis not present

## 2017-07-16 DIAGNOSIS — R1319 Other dysphagia: Secondary | ICD-10-CM

## 2017-07-16 DIAGNOSIS — Z8501 Personal history of malignant neoplasm of esophagus: Secondary | ICD-10-CM | POA: Diagnosis not present

## 2017-07-16 DIAGNOSIS — T18128A Food in esophagus causing other injury, initial encounter: Secondary | ICD-10-CM

## 2017-07-16 MED ORDER — SODIUM CHLORIDE 0.9 % IV SOLN: 500.0000 mL | Freq: Once | INTRAVENOUS | Status: DC

## 2017-07-16 NOTE — Progress Notes (Signed)
Report to PACU, RN, vss, BBS= Clear.  

## 2017-07-16 NOTE — Patient Instructions (Addendum)
   Full liquid diet for 2 hrs after procedure today ,then soft foods the rest of today,then advance as tolerated   You have been scheduled for repeat upper Endoscopy in 3 weeks .   YOU HAD AN ENDOSCOPIC PROCEDURE TODAY AT [facility]:   Refer to the procedure report that was given to you for any specific questions about what was found during the examination.  If the procedure report does not answer your questions, please call your gastroenterologist to clarify.  If you requested that your care partner not be given the details of your procedure findings, then the procedure report has been included in a sealed envelope for you to review at your convenience later.  YOU SHOULD EXPECT: Some feelings of bloating in the abdomen. Passage of more gas than usual.  Walking can help get rid of the air that was put into your GI tract during the procedure and reduce the bloating. If you had a lower endoscopy (such as a colonoscopy or flexible sigmoidoscopy) you may notice spotting of blood in your stool or on the toilet paper. If you underwent a bowel prep for your procedure, you may not have a normal bowel movement for a few days.  Please Note:  You might notice some irritation and congestion in your nose or some drainage.  This is from the oxygen used during your procedure.  There is no need for concern and it should clear up in a day or so.  SYMPTOMS TO REPORT IMMEDIATELY:     Following upper endoscopy (EGD)  Vomiting of blood or coffee ground material  New chest pain or pain under the shoulder blades  Painful or persistently difficult swallowing  New shortness of breath  Fever of 100F or higher  Black, tarry-looking stools  For urgent or emergent issues, a gastroenterologist can be reached at any hour by calling [phone].   DIET:  Full liquids for 2 hours after procedure today ,then soft foods the rest of today. ( see handout given on full liquids and soft  foods)   ACTIVITY:  You should plan to take it easy for the rest of today and you should NOT DRIVE or use heavy machinery until tomorrow (because of the sedation medicines used during the test).    FOLLOW UP: Our staff will call the number listed on your records the next business day following your procedure to check on you and address any questions or concerns that you may have regarding the information given to you following your procedure. If we do not reach you, we will leave a message.  However, if you are feeling well and you are not experiencing any problems, there is no need to return our call.  We will assume that you have returned to your regular daily activities without incident.  If any biopsies were taken you will be contacted by phone or by letter within the next 1-3 weeks.  Please call us at [phone] if you have not heard about the biopsies in 3 weeks.    SIGNATURES/CONFIDENTIALITY: You and/or your care partner have signed paperwork which will be entered into your electronic medical record.  These signatures attest to the fact that that the information above on your After Visit Summary has been reviewed and is understood.  Full responsibility of the confidentiality of this discharge information lies with you and/or your care-partner.

## 2017-07-16 NOTE — Op Note (Signed)
[facility] Patient Name: Darrell Cunningham Procedure Date: 07/16/2017 7:36 AM MRN: [id number] Endoscopist: [name] , MD Age: 56 Referring MD:  Date of Birth: Jun 11, 1961 Gender: Male Account #: [id number] Procedure:                Upper GI endoscopy Indications:              Dysphagia, For therapy of post-surgical anastomotic                            stenosis (history of esophageal cancer s/p                            esophagectomy with cervical esophagogastrostomy) Medicines:                Monitored Anesthesia Care Procedure:                Pre-Anesthesia Assessment:                           - Prior to the procedure, a History and Physical                            was performed, and patient medications and                            allergies were reviewed. The patient's tolerance of                            previous anesthesia was also reviewed. The risks                            and benefits of the procedure and the sedation                            options and risks were discussed with the patient.                            All questions were answered, and informed consent                            was obtained. Prior Anticoagulants: The patient has                            taken no previous anticoagulant or antiplatelet                            agents. ASA Grade Assessment: III - A patient with                            severe systemic disease. After reviewing the risks                            and benefits, the patient was deemed in  satisfactory condition to undergo the procedure.                           After obtaining informed consent, the endoscope was                            passed under direct vision. Throughout the                            procedure, the patient's blood pressure, pulse, and                            oxygen saturations were monitored continuously. The   Endoscope was introduced through the mouth, and                            advanced to the second part of duodenum. The upper                            GI endoscopy was accomplished without difficulty.                            The patient tolerated the procedure well. Scope In: Scope Out: Findings:                 Food was found in the upper third of the esophagus.                            Removal of food was accomplished with Jabier Mutton net.                           A partial esophagectomy anastomosis was found in                            the upper third of the esophagus (20 cm from                            incisors). This was characterized by severe                            stenosis, an intact staple line and visible                            sutures. The standard adult upper endoscope would                            not pass before dilation. A TTS dilator was passed                            through the scope. Dilation with an 8.5-9.5-10.5 mm                            and then 06-11-12 mm balloon dilator was performed  to 12 mm (inspection after 9.5 mm, 10.5 mm, 11 mm                            and 12 mm). The dilation site was examined and                            showed moderate and significant improvement in                            luminal narrowing. Estimated blood loss was                            minimal. After dilation to 12 mm the endoscope was                            able to transverse the anastomosis with minimal                            pressure.                           A medium amount of food (residue) was found in the                            gastric body.                           The exam of the stomach was otherwise normal.                           The examined duodenum was normal. Complications:            No immediate complications. Estimated Blood Loss:     Estimated blood loss was minimal. Impression:                - Food in the upper third of the esophagus. Removal                            was successful.                           - A partial esophagectomy anastomosis was found,                            characterized by severe stenosis, an intact staple                            line and visible sutures. Dilated to 12 mm.                           - A medium amount of food (residue) in the stomach.                           - Normal examined duodenum. Recommendation:           -  Patient has a contact number available for                            emergencies. The signs and symptoms of potential                            delayed complications were discussed with the                            patient. Return to normal activities tomorrow.                            Written discharge instructions were provided to the                            patient.                           - Full liquid diet for 2 hours.                           - Continue present medications.                           - Repeat upper endoscopy in 3 weeks for retreatment. [name], MD 07/16/2017 8:06:03 AM This report has been signed electronically.

## 2017-07-16 NOTE — Progress Notes (Signed)
Called to room to assist during endoscopic procedure.  Patient ID and intended procedure confirmed with present staff. Received instructions for my participation in the procedure from the performing physician.  

## 2017-07-16 NOTE — Progress Notes (Signed)
Pt's states no medical or surgical changes since previsit or office visit. 

## 2017-07-17 ENCOUNTER — Ambulatory Visit: Payer: Medicaid Other

## 2017-07-17 ENCOUNTER — Telehealth: Payer: Self-pay

## 2017-07-17 NOTE — Telephone Encounter (Signed)
Called [phone] and left a messaged we tried to reach pt for a follow up call. maw

## 2017-07-17 NOTE — Telephone Encounter (Signed)
  Follow up Call-  Call back number 07/16/2017 10/09/2016  Post procedure Call Back phone  # [phone]  Permission to leave phone message Yes Yes  Some recent data might be hidden     Patient questions:  Do you have a fever, pain , or abdominal swelling? No. Pain Score  0 *  Have you tolerated food without any problems? Yes.    Have you been able to return to your normal activities? Yes.    Do you have any questions about your discharge instructions: Diet   No. Medications  No. Follow up visit  No.  Do you have questions or concerns about your Care? No.  Actions: * If pain score is 4 or above: No action needed, pain <4.  No problems noted per pt. maw

## 2017-07-17 NOTE — Progress Notes (Addendum)
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  Patient with esophagectomy in Sept 2018. Patient reports had dilation yesterday.  Noted gastrograffin study done showing stricture.  Patient reports MD told him food was near the stricture.  Patient reports that he was unable to eat anything and was drinking the osmolite at one point to try and keep him going.  Reports that he had scrambled eggs this am and grits with coffee for breakfast and that did well. Reports had chicken noodle soup last night.  Reports that he has to go back in 3 weeks for another dilation.  Patient reports that he has ensure/boost drinks but has not started drinking them recently.  Reports he has not experienced any dumping syndrome symptoms.  Did have issues with constipation but this has resolved.  Contact information provided and encouraged patient to call with questions   Medications: reviewed  Labs: reviewed  Anthropometrics:   Weight 121 lb per patient yesterday in MD office, decreased from 133 lb in May 2018 prior to surgery and following treatment.     Estimated Energy Needs  Kcals: 1600-1900 calorie/d Protein: 80-95 g/d Fluid: 1.9 L/d  NUTRITION DIAGNOSIS: Inadequate oral intake continues due to recent stricture   INTERVENTION:   Discussed soft, smooth foods to for patient to try following dilation.  Reviewed handout with patient that MD provided him with after procedure and talked about options.   Encouraged patient to add oral nutrition supplement back into diet to increase calories and protein.    Reviewed dumping syndrome and foods that may cause symptoms.  Encouraged small frequent meals.      MONITORING, EVALUATION, GOAL: Patient will consume adequate calories and protein to promote weight gain   NEXT VISIT: Friday, Feb 15 following MD appointment  Danicia Terhaar B. Zenia Resides, [place], [place] Registered Dietitian [phone] (pager)

## 2017-07-26 ENCOUNTER — Ambulatory Visit (HOSPITAL_BASED_OUTPATIENT_CLINIC_OR_DEPARTMENT_OTHER): Payer: Medicaid Other

## 2017-07-26 VITALS — BP 115/68 | HR 90 | Temp 98.4°F | Resp 18

## 2017-07-26 DIAGNOSIS — C155 Malignant neoplasm of lower third of esophagus: Secondary | ICD-10-CM | POA: Diagnosis not present

## 2017-07-26 DIAGNOSIS — Z452 Encounter for adjustment and management of vascular access device: Secondary | ICD-10-CM | POA: Diagnosis not present

## 2017-07-26 DIAGNOSIS — Z95828 Presence of other vascular implants and grafts: Secondary | ICD-10-CM

## 2017-07-26 MED ORDER — SODIUM CHLORIDE 0.9% FLUSH
10.0000 mL | Freq: Once | INTRAVENOUS | Status: AC
  Administered 2017-07-26: 10 mL
  Filled 2017-07-26: qty 10

## 2017-07-26 MED ORDER — HEPARIN SOD (PORK) LOCK FLUSH 100 UNIT/ML IV SOLN
500.0000 [IU] | Freq: Once | INTRAVENOUS | Status: AC
  Administered 2017-07-26: 500 [IU]
  Filled 2017-07-26: qty 5

## 2017-07-26 NOTE — Patient Instructions (Signed)
Implanted Port Home Guide An implanted port is a type of central line that is placed under the skin. Central lines are used to provide IV access when treatment or nutrition needs to be given through a person's veins. Implanted ports are used for long-term IV access. An implanted port may be placed because:  You need IV medicine that would be irritating to the small veins in your hands or arms.  You need long-term IV medicines, such as antibiotics.  You need IV nutrition for a long period.  You need frequent blood draws for lab tests.  You need dialysis.  Implanted ports are usually placed in the chest area, but they can also be placed in the upper arm, the abdomen, or the leg. An implanted port has two main parts:  Reservoir. The reservoir is round and will appear as a small, raised area under your skin. The reservoir is the part where a needle is inserted to give medicines or draw blood.  Catheter. The catheter is a thin, flexible tube that extends from the reservoir. The catheter is placed into a large vein. Medicine that is inserted into the reservoir goes into the catheter and then into the vein.  How will I care for my incision site? Do not get the incision site wet. Bathe or shower as directed by your health care provider. How is my port accessed? Special steps must be taken to access the port:  Before the port is accessed, a numbing cream can be placed on the skin. This helps numb the skin over the port site.  Your health care provider uses a sterile technique to access the port. ? Your health care provider must put on a mask and sterile gloves. ? The skin over your port is cleaned carefully with an antiseptic and allowed to dry. ? The port is gently pinched between sterile gloves, and a needle is inserted into the port.  Only "non-coring" port needles should be used to access the port. Once the port is accessed, a blood return should be checked. This helps ensure that the port  is in the vein and is not clogged.  If your port needs to remain accessed for a constant infusion, a clear (transparent) bandage will be placed over the needle site. The bandage and needle will need to be changed every week, or as directed by your health care provider.  Keep the bandage covering the needle clean and dry. Do not get it wet. Follow your health care provider's instructions on how to take a shower or bath while the port is accessed.  If your port does not need to stay accessed, no bandage is needed over the port.  What is flushing? Flushing helps keep the port from getting clogged. Follow your health care provider's instructions on how and when to flush the port. Ports are usually flushed with saline solution or a medicine called heparin. The need for flushing will depend on how the port is used.  If the port is used for intermittent medicines or blood draws, the port will need to be flushed: ? After medicines have been given. ? After blood has been drawn. ? As part of routine maintenance.  If a constant infusion is running, the port may not need to be flushed.  How long will my port stay implanted? The port can stay in for as long as your health care provider thinks it is needed. When it is time for the port to come out, surgery will be   done to remove it. The procedure is similar to the one performed when the port was put in. When should I seek immediate medical care? When you have an implanted port, you should seek immediate medical care if:  You notice a bad smell coming from the incision site.  You have swelling, redness, or drainage at the incision site.  You have more swelling or pain at the port site or the surrounding area.  You have a fever that is not controlled with medicine.  This information is not intended to replace advice given to you by your health care provider. Make sure you discuss any questions you have with your health care provider. Document  Released: 07/17/2005 Document Revised: 12/23/2015 Document Reviewed: 03/24/2013 Elsevier Interactive Patient Education  2017 [employer] Inc[employer]  

## 2017-07-31 ENCOUNTER — Other Ambulatory Visit: Payer: Self-pay | Admitting: Hematology

## 2017-08-02 ENCOUNTER — Encounter: Payer: Self-pay | Admitting: Cardiothoracic Surgery

## 2017-08-02 ENCOUNTER — Other Ambulatory Visit: Payer: Self-pay | Admitting: Cardiothoracic Surgery

## 2017-08-02 ENCOUNTER — Ambulatory Visit: Payer: Medicaid Other | Admitting: Cardiothoracic Surgery

## 2017-08-02 ENCOUNTER — Ambulatory Visit
Admission: RE | Admit: 2017-08-02 | Discharge: 2017-08-02 | Disposition: A | Discharge status: Home / Self Care | Payer: Medicaid Other | Source: Ambulatory Visit | Attending: Cardiothoracic Surgery | Admitting: Cardiothoracic Surgery

## 2017-08-02 ENCOUNTER — Other Ambulatory Visit: Payer: Self-pay | Admitting: *Deleted

## 2017-08-02 VITALS — BP 98/73 | HR 83 | Resp 18 | Ht 60.0 in | Wt 120.0 lb

## 2017-08-02 DIAGNOSIS — C155 Malignant neoplasm of lower third of esophagus: Secondary | ICD-10-CM

## 2017-08-02 DIAGNOSIS — C159 Malignant neoplasm of esophagus, unspecified: Secondary | ICD-10-CM | POA: Diagnosis not present

## 2017-08-02 MED ORDER — CYCLOBENZAPRINE HCL 5 MG PO TABS: 5.0000 mg | ORAL_TABLET | Freq: Three times a day (TID) | ORAL | 0 refills | Status: DC | PRN

## 2017-08-02 NOTE — Progress Notes (Signed)
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 PROCEDURES PERFORMED:  Bronchoscopy, transhiatal total esophagectomy with cervical esophagogastrostomy, pyloromyotomy, and feeding jejunostomy tube. SURGEON:  [name], MD.  Cancer Staging Malignant neoplasm of lower third of esophagus [facility]) Staging form: Esophagus - Adenocarcinoma, AJCC 8th Edition - Clinical stage from 10/26/2016: Stage III (cT3, cN1, cM0) - Signed by [name], MD on 11/05/2016 - Pathologic stage from 04/20/2017: Stage I (ypT0, pN0, cM0, GX) - Signed by [name], MD on 04/23/2017   History of Present Illness:      Patient returns to the office today for postop check after recent esophagectomy.  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  This was dilated.  The patient notes some improvement but over the last week he has had increasing difficulty.  Repeat dilatation is scheduled for January 7.  Zubrod Score: At the time of surgery this patient's most appropriate activity status/level should be described as: [x]     0    Normal activity, no symptoms []     1    Restricted in physical strenuous activity but ambulatory, able to do out light work []     2    Ambulatory and capable of self care, unable to do work activities, up and about                 >50 % of waking hours                                                                                    []     3    Only limited self care, in bed greater than 50% of waking hours []     4    Completely disabled, no self care, confined to bed or chair []     5    Moribund  Social History   Tobacco Use  Smoking Status Former Smoker  . Packs/day: 1.00  . Years: 38.00  . Pack years: 38.00  . Types: Cigarettes  . Start date: 10/31/2012  . Last attempt to quit: 10/31/2012  . Years since quitting: 4.7  Smokeless Tobacco Never Used       Allergies  Allergen Reactions  . Penicillins Hives, Itching and Swelling    Has patient had a PCN reaction causing immediate rash, facial/tongue/throat swelling, SOB or lightheadedness with hypotension: No Has patient had a PCN reaction causing severe rash involving mucus membranes or skin necrosis: No Has patient had a PCN reaction that required hospitalization  No Has patient had a PCN reaction occurring within the last 10 years: No If all of the above answers are "NO", then may proceed with Cephalosporin use. Unknown Childhood reaction    Current Outpatient Medications  Medication Sig Dispense Refill  . acetaminophen (TYLENOL) 160 MG/5ML solution Take 10.2 mLs (325 mg total) by mouth every 4 (four) hours as needed for moderate pain. 120 mL 0  . cyclobenzaprine (FLEXERIL) 5 MG tablet TAKE 1 TABLET (5 MG TOTAL) 3 (THREE) TIMES DAILY AS NEEDED BY MOUTH FOR MUSCLE SPASMS. 30 tablet 0  . fluticasone (FLONASE) 50 MCG/ACT nasal spray Place 1 spray into both nostrils daily as needed for allergies or rhinitis.    Marland Kitchen lactose free nutrition (BOOST) LIQD Take 237 mLs 2 (two) times daily between meals by mouth.    . lidocaine-prilocaine (EMLA) cream Apply 1 application topically as needed. Apply to portacath  1 1/2 hours - 2 hours prior to procedures as needed. 30 g 1  . metoprolol tartrate (LOPRESSOR) 25 MG tablet Take 0.5 tablets (12.5 mg total) by mouth 2 (two) times daily. 60 tablet 1   Current Facility-Administered Medications    Medication Dose Route Frequency Provider Last Rate Last Dose  . 0.9 %  sodium chloride infusion  500 mL Intravenous Once Pyrtle, [name], MD        Physical Exam: BP 98/73   Pulse 83   Resp 18   Ht 5' (1.524 m)   Wt 120 lb (54.4 kg)   SpO2 98% Comment: RA  BMI 23.44 kg/m   General appearance: alert, cooperative, appears older than stated age and Patient has obviously lost weight since his last visit Head: Normocephalic, without obvious abnormality, atraumatic Neck: no adenopathy, no carotid bruit, no JVD, supple, symmetrical, trachea midline and thyroid not enlarged, symmetric, no tenderness/mass/nodules Lymph nodes: Cervical, supraclavicular, and axillary nodes normal. Resp: clear to auscultation bilaterally Back: symmetric, no curvature. ROM normal. No CVA tenderness. Cardio: regular rate and rhythm, S1, S2 normal, no murmur, click, rub or gallop GI: soft, non-tender; bowel sounds normal; no masses,  no organomegaly and J-tube site is now completely healed Extremities: extremities normal, atraumatic, no cyanosis or edema and Homans sign is negative, no sign of DVT Neurologic: Grossly normal   Diagnostic Studies & Laboratory data:         Recent Radiology Findings: Dg Chest 2 View  Result Date: 08/02/2017 CLINICAL DATA:  Malaise. History of esophageal malignancy, several palsy. EXAM: CHEST  2 VIEW COMPARISON:  Chest x-ray of May 17 2017. FINDINGS: The lungs are adequately inflated. There is no focal infiltrate. There is chronically increased density in the retrocardiac region on the lateral view in the region of the distal esophagus. The heart and pulmonary vascularity are normal. The mediastinum is normal in width. There is calcification in the wall of the aortic arch. The power port catheter tip projects over the distal third of the SVC. IMPRESSION: There is no pneumonia, CHF, nor other acute cardiopulmonary abnormality. Chronic soft tissue fullness over the distal esophagus  likely related to the pull-through procedure. Thoracic aortic atherosclerosis. Electronically Signed   By: David  Martinique M.D.   On: 08/02/2017 13:02      I have independently reviewed the above radiology findings and reviewed findings  with the patient.  Recent Labs: Lab Results  Component Value Date   WBC 5.6 06/14/2017   HGB 12.1 (L) 06/14/2017   HCT 37.4 (L) 06/14/2017   PLT 191 06/14/2017   GLUCOSE  101 06/14/2017   CHOL 157 10/27/2014   TRIG 83 10/27/2014   HDL 34 (L) 10/27/2014   LDLCALC 106 (H) 10/27/2014   ALT 26 06/14/2017   AST 22 06/14/2017   NA 137 06/14/2017   K 3.9 06/14/2017   CL 100 (L) 04/26/2017   CREATININE 0.7 06/14/2017   BUN 10.2 06/14/2017   CO2 22 06/14/2017   INR 0.91 04/12/2017   HGBA1C 5.7 (H) 10/27/2014   Wt Readings from Last 3 Encounters:  08/02/17 120 lb (54.4 kg)  07/16/17 120 lb (54.4 kg)  07/13/17 120 lb (54.4 kg)      Assessment / Plan:   Patient has signs and symptoms and radiographic evidence of cervical anastomotic stricture.  3 months ago the patient underwent transhiatal total esophagectomy with cervical esophagogastrostomy.  Upper GI endoscopy with dilatation of the cervical esophagus has been performed with initial improvement, the patient is scheduled for a second dilatation on January 7.  I discussed with him the need to have repeated early dilatations to get the best long-term outcome from anastomotic stricture.    [name] 08/02/2017 2:49 PM

## 2017-08-06 ENCOUNTER — Other Ambulatory Visit: Payer: Self-pay

## 2017-08-06 ENCOUNTER — Ambulatory Visit (AMBULATORY_SURGERY_CENTER): Payer: Self-pay | Admitting: *Deleted

## 2017-08-06 VITALS — Ht 60.0 in | Wt 119.2 lb

## 2017-08-06 DIAGNOSIS — R1310 Dysphagia, unspecified: Secondary | ICD-10-CM

## 2017-08-06 DIAGNOSIS — R1319 Other dysphagia: Secondary | ICD-10-CM

## 2017-08-06 DIAGNOSIS — Z8501 Personal history of malignant neoplasm of esophagus: Secondary | ICD-10-CM

## 2017-08-06 NOTE — Progress Notes (Signed)
No egg or soy allergy known to patient  No issues with past sedation with any surgeries  or procedures, no intubation problems  No diet pills per patient No home 02 use per patient  No blood thinners per patient  Pt denies issues with constipation  No A fib or A flutter  EMMI video sent to pt's e mail  Pt. declined 

## 2017-08-13 ENCOUNTER — Other Ambulatory Visit: Payer: Self-pay

## 2017-08-13 ENCOUNTER — Ambulatory Visit (AMBULATORY_SURGERY_CENTER): Payer: Medicaid Other | Admitting: Internal Medicine

## 2017-08-13 ENCOUNTER — Encounter: Payer: Self-pay | Admitting: Internal Medicine

## 2017-08-13 VITALS — BP 88/50 | HR 76 | Temp 97.7°F | Resp 26 | Ht 61.0 in | Wt 120.0 lb

## 2017-08-13 DIAGNOSIS — K222 Esophageal obstruction: Secondary | ICD-10-CM | POA: Diagnosis not present

## 2017-08-13 DIAGNOSIS — R1319 Other dysphagia: Secondary | ICD-10-CM

## 2017-08-13 DIAGNOSIS — R1310 Dysphagia, unspecified: Secondary | ICD-10-CM | POA: Diagnosis not present

## 2017-08-13 DIAGNOSIS — T18128A Food in esophagus causing other injury, initial encounter: Secondary | ICD-10-CM | POA: Diagnosis not present

## 2017-08-13 MED ORDER — SODIUM CHLORIDE 0.9 % IV SOLN: 500.0000 mL | INTRAVENOUS | Status: DC

## 2017-08-13 NOTE — Progress Notes (Signed)
Pt. Reports no change in his medical or surgical history since his pre-visit 08/06/2017.

## 2017-08-13 NOTE — Progress Notes (Signed)
Called to room to assist during endoscopic procedure.  Patient ID and intended procedure confirmed with present staff. Received instructions for my participation in the procedure from the performing physician.  

## 2017-08-13 NOTE — Progress Notes (Signed)
Report to PACU, RN, vss, BBS= Clear.  

## 2017-08-13 NOTE — Op Note (Signed)
[facility] Patient Name: Diaz Crago Procedure Date: 08/13/2017 3:36 PM MRN: [id number] Endoscopist: [name] , MD Age: 57 Referring MD:  Date of Birth: 1961-07-06 Gender: Male Account #: [id number] Procedure:                Upper GI endoscopy Indications:              Dysphagia, Post-surgical anastomotic stenosis,                            Management and Dilation of anastomotic stricture Medicines:                Monitored Anesthesia Care Procedure:                Pre-Anesthesia Assessment:                           - Prior to the procedure, a History and Physical                            was performed, and patient medications and                            allergies were reviewed. The patient's tolerance of                            previous anesthesia was also reviewed. The risks                            and benefits of the procedure and the sedation                            options and risks were discussed with the patient.                            All questions were answered, and informed consent                            was obtained. Prior Anticoagulants: The patient has                            taken no previous anticoagulant or antiplatelet                            agents. ASA Grade Assessment: III - A patient with                            severe systemic disease. After reviewing the risks                            and benefits, the patient was deemed in                            satisfactory condition to undergo the procedure.  After obtaining informed consent, the endoscope was                            passed under direct vision. Throughout the                            procedure, the patient's blood pressure, pulse, and                            oxygen saturations were monitored continuously. The                            Model GIF-HQ190 [id number]) scope was introduced                            through  the mouth, and advanced to the second part                            of duodenum. The upper GI endoscopy was                            accomplished without difficulty. The patient                            tolerated the procedure well. Scope In: Scope Out: Findings:                 Food was found in the proximal esophagus. Removal                            of food was accomplished with Jabier Mutton net.                           One severe (stenosis; an endoscope cannot pass)                            benign-appearing, intrinsic stenosis was found 20                            cm from the incisors. This measured 1 cm (in                            length) and was traversed after dilation. A TTS                            dilator was passed through the scope. Dilation with                            an 06-11-12 mm balloon dilator was performed to 13                            mm (after 40mm and 12 mm). The dilation site was  examined and showed moderate improvement in luminal                            narrowing. At the stricture there is a visible                            staple on the proximal side and visible suture                            material on the gastric side.                           A medium amount of food (residue) was found in the                            gastric body.                           The in the duodenum was normal. Complications:            No immediate complications. Estimated Blood Loss:     Estimated blood loss was minimal. Impression:               - Food in the proximal esophagus. Removal was                            successful.                           - Benign-appearing esophageal stenosis. Dilated to                            13 mm.                           - A medium amount of food (residue) in the stomach.                           - Normal examined duodenum. Recommendation:           - Patient has a contact number  available for                            emergencies. The signs and symptoms of potential                            delayed complications were discussed with the                            patient. Return to normal activities tomorrow.                            Written discharge instructions were provided to the                            patient.                           -  Soft diet.                           - Continue present medications.                           - Referral to Dr. Lysle Rubens at [facility] for consideration of                            needle-knife therapy for anastomotic stricture                            versus Axios stent placement. [name], MD 08/13/2017 4:21:49 PM This report has been signed electronically.

## 2017-08-13 NOTE — Patient Instructions (Signed)
informaiton given on soft diet.  YOU HAD AN ENDOSCOPIC PROCEDURE TODAY AT [facility]:   Refer to the procedure report that was given to you for any specific questions about what was found during the examination.  If the procedure report does not answer your questions, please call your gastroenterologist to clarify.  If you requested that your care partner not be given the details of your procedure findings, then the procedure report has been included in a sealed envelope for you to review at your convenience later.  YOU SHOULD EXPECT: Some feelings of bloating in the abdomen. Passage of more gas than usual.  Walking can help get rid of the air that was put into your GI tract during the procedure and reduce the bloating. If you had a lower endoscopy (such as a colonoscopy or flexible sigmoidoscopy) you may notice spotting of blood in your stool or on the toilet paper. If you underwent a bowel prep for your procedure, you may not have a normal bowel movement for a few days.  Please Note:  You might notice some irritation and congestion in your nose or some drainage.  This is from the oxygen used during your procedure.  There is no need for concern and it should clear up in a day or so.  SYMPTOMS TO REPORT IMMEDIATELY:  Following upper endoscopy (EGD)  Vomiting of blood or coffee ground material  New chest pain or pain under the shoulder blades  Painful or persistently difficult swallowing  New shortness of breath  Fever of 100F or higher  Black, tarry-looking stools  For urgent or emergent issues, a gastroenterologist can be reached at any hour by calling [phone].   DIET:  We do recommend a small meal at first, but then you may proceed to your regular diet.  Drink plenty of fluids but you should avoid alcoholic beverages for 24 hours.  ACTIVITY:  You should plan to take it easy for the rest of today and you should NOT DRIVE or use heavy machinery until tomorrow (because  of the sedation medicines used during the test).    FOLLOW UP: Our staff will call the number listed on your records the next business day following your procedure to check on you and address any questions or concerns that you may have regarding the information given to you following your procedure. If we do not reach you, we will leave a message.  However, if you are feeling well and you are not experiencing any problems, there is no need to return our call.  We will assume that you have returned to your regular daily activities without incident.  If any biopsies were taken you will be contacted by phone or by letter within the next 1-3 weeks.  Please call us at [phone] if you have not heard about the biopsies in 3 weeks.    SIGNATURES/CONFIDENTIALITY: You and/or your care partner have signed paperwork which will be entered into your electronic medical record.  These signatures attest to the fact that that the information above on your After Visit Summary has been reviewed and is understood.  Full responsibility of the confidentiality of this discharge information lies with you and/or your care-partner.

## 2017-08-14 ENCOUNTER — Telehealth: Payer: Self-pay | Admitting: *Deleted

## 2017-08-14 ENCOUNTER — Telehealth: Payer: Self-pay

## 2017-08-14 NOTE — Telephone Encounter (Signed)
Called [phone] and left a messaged we tried to reach pt for a follow up call. maw

## 2017-08-14 NOTE — Telephone Encounter (Signed)
No answer for second post procedure call back. Left message for patient to call with questions or concerns. SM 

## 2017-08-23 ENCOUNTER — Ambulatory Visit: Payer: Medicaid Other | Admitting: Cardiothoracic Surgery

## 2017-08-27 ENCOUNTER — Other Ambulatory Visit: Payer: Self-pay | Admitting: Hematology

## 2017-08-27 DIAGNOSIS — C155 Malignant neoplasm of lower third of esophagus: Secondary | ICD-10-CM

## 2017-08-28 ENCOUNTER — Other Ambulatory Visit: Payer: Self-pay

## 2017-08-30 ENCOUNTER — Other Ambulatory Visit: Payer: Self-pay | Admitting: Hematology

## 2017-09-04 ENCOUNTER — Telehealth: Payer: Self-pay | Admitting: *Deleted

## 2017-09-04 NOTE — Telephone Encounter (Signed)
Dr [name] has received endoscopy report completed at [facility] 08/31/17. Per Dr [name], "EGD with needle knife and dilation at [facility]. Scan report. Have patient follow up with me in clinic next available. JMP"  I have spoken to Mr.Enge who is scheduled for Dr Vena Rua next available appointment on 11/08/17 at 230 pm. Patient would also like Dr [name] to know that since endoscopy at [facility] he is feeling much better and is able to eat!

## 2017-09-05 ENCOUNTER — Encounter: Payer: Medicaid Other | Admitting: Cardiothoracic Surgery

## 2017-09-06 ENCOUNTER — Encounter: Payer: Medicaid Other | Admitting: Cardiothoracic Surgery

## 2017-09-11 ENCOUNTER — Other Ambulatory Visit: Payer: Self-pay

## 2017-09-11 ENCOUNTER — Encounter: Payer: Self-pay | Admitting: Cardiothoracic Surgery

## 2017-09-11 ENCOUNTER — Ambulatory Visit (HOSPITAL_COMMUNITY)
Admission: RE | Admit: 2017-09-11 | Discharge: 2017-09-11 | Disposition: A | Discharge status: Home / Self Care | Payer: Medicaid Other | Source: Ambulatory Visit | Attending: Hematology | Admitting: Hematology

## 2017-09-11 ENCOUNTER — Ambulatory Visit: Payer: Medicaid Other | Admitting: Cardiothoracic Surgery

## 2017-09-11 VITALS — BP 120/75 | HR 107 | Resp 18 | Ht 61.0 in | Wt 123.0 lb

## 2017-09-11 DIAGNOSIS — C159 Malignant neoplasm of esophagus, unspecified: Secondary | ICD-10-CM

## 2017-09-11 DIAGNOSIS — C155 Malignant neoplasm of lower third of esophagus: Secondary | ICD-10-CM | POA: Insufficient documentation

## 2017-09-11 DIAGNOSIS — Z923 Personal history of irradiation: Secondary | ICD-10-CM | POA: Insufficient documentation

## 2017-09-11 DIAGNOSIS — Z9049 Acquired absence of other specified parts of digestive tract: Secondary | ICD-10-CM | POA: Diagnosis not present

## 2017-09-11 DIAGNOSIS — I700 Atherosclerosis of aorta: Secondary | ICD-10-CM | POA: Diagnosis not present

## 2017-09-11 DIAGNOSIS — K222 Esophageal obstruction: Secondary | ICD-10-CM

## 2017-09-11 DIAGNOSIS — R918 Other nonspecific abnormal finding of lung field: Secondary | ICD-10-CM | POA: Insufficient documentation

## 2017-09-11 MED ORDER — IOPAMIDOL (ISOVUE-300) INJECTION 61%
INTRAVENOUS | Status: AC
  Filled 2017-09-11: qty 100

## 2017-09-11 MED ORDER — IOPAMIDOL (ISOVUE-300) INJECTION 61%
100.0000 mL | Freq: Once | INTRAVENOUS | Status: AC | PRN
Start: 2017-09-11 — End: 2017-09-11
  Administered 2017-09-11: 100 mL via INTRAVENOUS

## 2017-09-13 NOTE — Progress Notes (Signed)
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 PROCEDURES PERFORMED:  Bronchoscopy, transhiatal total esophagectomy with cervical esophagogastrostomy, pyloromyotomy, and feeding jejunostomy tube. SURGEON:  [name], MD.  Cancer Staging Malignant neoplasm of lower third of esophagus [facility]) Staging form: Esophagus - Adenocarcinoma, AJCC 8th Edition - Clinical stage from 10/26/2016: Stage III (cT3, cN1, cM0) - Signed by [name], MD on 11/05/2016 - Pathologic stage from 04/20/2017: Stage I (ypT0, pN0, cM0, GX) - Signed by [name], MD on 04/23/2017   History of Present Illness:      Patient returns to the office today for postop check after recent esophagectomy.  Post surgery the patient developed a anastomotic stricture.  This was dilated by Dr. [name].  More recently the patient had an endoscopic procedure done with 5 quadrant biliary needle-knife excision of the anastomotic stricture.  The patient notes that since this is been done his swallowing has markedly improved.     Zubrod Score: At the time of surgery this patient's most appropriate activity status/level should be described as: [x]     0    Normal activity, no symptoms []     1    Restricted in physical strenuous activity but ambulatory, able to do out light work []     2    Ambulatory and capable of self care, unable to do work activities, up and about                 >50 % of waking hours                                                                                    []     3    Only limited self care, in bed greater than 50% of waking hours []     4    Completely disabled, no self care, confined to bed or chair []     5    Moribund  Social History   Tobacco Use  Smoking Status Former Smoker  . Packs/day: 1.00  . Years: 38.00  . Pack years: 38.00  . Types: Cigarettes  . Start date: 10/31/2012  . Last attempt to quit: 10/31/2012  . Years since quitting: 4.8  Smokeless Tobacco Never Used       Allergies  Allergen Reactions  . Penicillins Hives, Itching and Swelling    Has patient had a PCN reaction causing immediate rash, facial/tongue/throat swelling, SOB or lightheadedness with hypotension: No Has patient had a PCN reaction causing severe rash involving mucus membranes or skin necrosis: No Has patient had a PCN reaction that required hospitalization No Has patient  had a PCN reaction occurring within the last 10 years: No If all of the above answers are "NO", then may proceed with Cephalosporin use. Unknown Childhood reaction    Current Outpatient Medications  Medication Sig Dispense Refill  . acetaminophen (TYLENOL) 160 MG/5ML solution Take 10.2 mLs (325 mg total) by mouth every 4 (four) hours as needed for moderate pain. 120 mL 0  . cyclobenzaprine (FLEXERIL) 5 MG tablet TAKE 1 TABLET BY MOUTH THREE TIMES A DAY AS NEEDED FOR MUSCLE SPASMS 30 tablet 0  . Electrolyte-S (ISOLYTE-S IV) Inject into the vein. Drinks orally use to use through feeding tube.    . fluticasone (FLONASE) 50 MCG/ACT nasal spray Place 1 spray into both nostrils daily as needed for allergies or rhinitis.    Marland Kitchen lactose free nutrition (BOOST) LIQD Take 237 mLs 2 (two) times daily between meals by mouth.    . lidocaine-prilocaine (EMLA) cream Apply 1 application topically as needed. Apply to portacath  1 1/2 hours - 2 hours prior to procedures as needed. 30 g 1  . metoprolol tartrate (LOPRESSOR) 25 MG tablet Take 0.5 tablets (12.5 mg total) by mouth 2 (two)  times daily. 60 tablet 1   Current Facility-Administered Medications  Medication Dose Route Frequency Provider Last Rate Last Dose  . 0.9 %  sodium chloride infusion  500 mL Intravenous Once Pyrtle, [name], MD      . 0.9 %  sodium chloride infusion  500 mL Intravenous Continuous Pyrtle, [name], MD        Physical Exam: BP 120/75 (BP Location: Left Arm, Patient Position: Sitting, Cuff Size: Normal)   Pulse (!) 107   Resp 18   Ht 5\' 1"  (1.549 m)   Wt 123 lb (55.8 kg)   SpO2 98% Comment: RA  BMI 23.24 kg/m   General appearance: alert, cooperative and no distress Head: Normocephalic, without obvious abnormality, atraumatic Neck: no adenopathy, no carotid bruit, no JVD, supple, symmetrical, trachea midline and thyroid not enlarged, symmetric, no tenderness/mass/nodules Lymph nodes: Cervical, supraclavicular, and axillary nodes normal. Resp: clear to auscultation bilaterally Back: symmetric, no curvature. ROM normal. No CVA tenderness. Cardio: regular rate and rhythm, S1, S2 normal, no murmur, click, rub or gallop GI: soft, non-tender; bowel sounds normal; no masses,  no organomegaly Extremities: extremities normal, atraumatic, no cyanosis or edema and Homans sign is negative, no sign of DVT Neurologic: Grossly normal Patient's left neck incision abdominal incision is well-healed  Diagnostic Studies & Laboratory data:         Recent Radiology Findings: No results found.    I have independently reviewed the above radiology findings and reviewed findings  with the patient.  Recent Labs: Lab Results  Component Value Date   WBC 5.6 06/14/2017   HGB 12.1 (L) 06/14/2017   HCT 37.4 (L) 06/14/2017   PLT 191 06/14/2017   GLUCOSE 101 06/14/2017   CHOL 157 10/27/2014   TRIG 83 10/27/2014   HDL 34 (L) 10/27/2014   LDLCALC 106 (H) 10/27/2014   ALT 26 06/14/2017   AST 22 06/14/2017   NA 137 06/14/2017   K 3.9 06/14/2017   CL 100 (L) 04/26/2017   CREATININE 0.7 06/14/2017   BUN 10.2  06/14/2017   CO2 22 06/14/2017   INR 0.91 04/12/2017   HGBA1C 5.7 (H) 10/27/2014   Wt Readings from Last 3 Encounters:  09/11/17 123 lb (55.8 kg)  08/13/17 120 lb (54.4 kg)  08/06/17 119 lb 3.2 oz (54.1 kg)  Assessment / Plan:   Patient with postoperative anastomotic stricture cervical esophagogastrostomy, now status post electrical incision of the stricture with dilatation done on February 1.  The patient notes significant improvement in symptoms and his ability to take a p.o. Diet  Plan to see him back in 3 months   [name] 09/13/2017 12:58 PM

## 2017-09-14 ENCOUNTER — Ambulatory Visit: Payer: Medicaid Other | Admitting: Nutrition

## 2017-09-14 ENCOUNTER — Encounter: Payer: Self-pay | Admitting: Nurse Practitioner

## 2017-09-14 ENCOUNTER — Inpatient Hospital Stay: Payer: Medicaid Other

## 2017-09-14 ENCOUNTER — Telehealth: Payer: Self-pay

## 2017-09-14 ENCOUNTER — Inpatient Hospital Stay: Payer: Medicaid Other | Attending: Hematology | Admitting: Nurse Practitioner

## 2017-09-14 VITALS — BP 125/72 | HR 84 | Temp 98.0°F | Resp 18 | Ht 61.0 in | Wt 123.5 lb

## 2017-09-14 DIAGNOSIS — C155 Malignant neoplasm of lower third of esophagus: Secondary | ICD-10-CM

## 2017-09-14 DIAGNOSIS — R1310 Dysphagia, unspecified: Secondary | ICD-10-CM | POA: Insufficient documentation

## 2017-09-14 DIAGNOSIS — Z923 Personal history of irradiation: Secondary | ICD-10-CM | POA: Diagnosis not present

## 2017-09-14 DIAGNOSIS — E46 Unspecified protein-calorie malnutrition: Secondary | ICD-10-CM | POA: Insufficient documentation

## 2017-09-14 DIAGNOSIS — D638 Anemia in other chronic diseases classified elsewhere: Secondary | ICD-10-CM

## 2017-09-14 DIAGNOSIS — Z79899 Other long term (current) drug therapy: Secondary | ICD-10-CM | POA: Insufficient documentation

## 2017-09-14 DIAGNOSIS — Z95828 Presence of other vascular implants and grafts: Secondary | ICD-10-CM

## 2017-09-14 DIAGNOSIS — Z87891 Personal history of nicotine dependence: Secondary | ICD-10-CM | POA: Diagnosis not present

## 2017-09-14 DIAGNOSIS — D649 Anemia, unspecified: Secondary | ICD-10-CM | POA: Insufficient documentation

## 2017-09-14 DIAGNOSIS — J449 Chronic obstructive pulmonary disease, unspecified: Secondary | ICD-10-CM | POA: Diagnosis not present

## 2017-09-14 LAB — COMPREHENSIVE METABOLIC PANEL
ALBUMIN: 3 g/dL — AB (ref 3.5–5.0)
ALK PHOS: 136 U/L (ref 40–150)
ALT: 31 U/L (ref 0–55)
AST: 29 U/L (ref 5–34)
Anion gap: 9 (ref 3–11)
BILIRUBIN TOTAL: 0.2 mg/dL (ref 0.2–1.2)
BUN: 16 mg/dL (ref 7–26)
CO2: 24 mmol/L (ref 22–29)
CREATININE: 0.63 mg/dL — AB (ref 0.70–1.30)
Calcium: 9.4 mg/dL (ref 8.4–10.4)
Chloride: 108 mmol/L (ref 98–109)
GFR calc Af Amer: >60 mL/min (ref >60)
GFR calc non Af Amer: >60 mL/min (ref >60)
GLUCOSE: 115 mg/dL (ref 70–140)
Potassium: 3.9 mmol/L (ref 3.5–5.1)
Sodium: 141 mmol/L (ref 136–145)
TOTAL PROTEIN: 6.5 g/dL (ref 6.4–8.3)

## 2017-09-14 LAB — CBC WITH DIFFERENTIAL/PLATELET
BASOS ABS: 0 10*3/uL (ref 0.0–0.1)
BASOS PCT: 1 %
EOS PCT: 3 %
Eosinophils Absolute: 0.2 10*3/uL (ref 0.0–0.5)
HCT: 36.3 % — ABNORMAL LOW (ref 38.4–49.9)
Hemoglobin: 12.1 g/dL — ABNORMAL LOW (ref 13.0–17.1)
Lymphocytes Relative: 14 %
Lymphs Abs: 0.8 10*3/uL — ABNORMAL LOW (ref 0.9–3.3)
MCH: 29.5 pg (ref 27.2–33.4)
MCHC: 33.4 g/dL (ref 32.0–36.0)
MCV: 88.2 fL (ref 79.3–98.0)
MONO ABS: 0.5 10*3/uL (ref 0.1–0.9)
MONOS PCT: 9 %
Neutro Abs: 4.1 10*3/uL (ref 1.5–6.5)
Neutrophils Relative %: 73 %
PLATELETS: 204 10*3/uL (ref 140–400)
RBC: 4.12 MIL/uL — ABNORMAL LOW (ref 4.20–5.82)
RDW: 17.3 % — AB (ref 11.0–14.6)
WBC: 5.5 10*3/uL (ref 4.0–10.3)

## 2017-09-14 LAB — CEA (IN HOUSE-CHCC): CEA (CHCC-In House): 1.04 ng/mL (ref 0.00–5.00)

## 2017-09-14 MED ORDER — SODIUM CHLORIDE 0.9% FLUSH
10.0000 mL | Freq: Once | INTRAVENOUS | Status: AC
  Administered 2017-09-14: 10 mL
  Filled 2017-09-14: qty 10

## 2017-09-14 MED ORDER — HEPARIN SOD (PORK) LOCK FLUSH 100 UNIT/ML IV SOLN
500.0000 [IU] | Freq: Once | INTRAVENOUS | Status: AC
  Administered 2017-09-14: 500 [IU]
  Filled 2017-09-14: qty 5

## 2017-09-14 NOTE — Progress Notes (Signed)
[place]  [phone] [phone]  Clinic Follow up Note   Patient Care Team: [name], MD as PCP - General (Family Medicine) 09/14/2017  CHIEF COMPLAINT: Follow up esophagus cancer   SUMMARY OF ONCOLOGIC HISTORY: Oncology History   Cancer Staging Esophageal cancer [facility]) Staging form: Esophagus - Adenocarcinoma, AJCC 8th Edition - Clinical stage from 10/26/2016: Stage III (cT3, cN1, cM0) - Signed by [name], MD on 11/05/2016       Malignant neoplasm of lower third of esophagus ([place])   09/21/2016 - 09/21/2016 Hospital Admission    esophageal pain and vomiting up blood      10/09/2016 Initial Diagnosis    Esophageal cancer ([place])      10/09/2016 Procedure    EGD 1. Partially obstructing, likely malignant esophageal tumor was found in the lower third of the esophagus. Multiple biopsies.  2. Mass visible during gastric retroflexion at GE junction 3. Otherwise normal stomach 4. Normal examined duodenum       10/09/2016 Pathology Results    Esophagus, biopsy, distal esophageal tumor (33-39) - SUSPICIOUS FOR ADENOCARCINOMA      10/12/2016 Imaging    CT CAP w Contrast IMPRESSION: Distal esophageal mass compatible with primary esophageal malignancy. There are 2 adjacent abnormal appearing subcentimeter paraesophageal lymph nodes which may represent nodal metastasis. Additionally there is a prominent nonspecific 8 mm upper abdominal lymph node.  No evidence for distant metastatic disease in the chest, abdomen or pelvis.      10/24/2016 PET scan    1. Markedly hypermetabolic distal esophageal lesion, compatible with malignancy. Adjacent small paraesophageal lymph nodes are abnormal by CT but cannot be resolved as separate structures from the hypermetabolic esophageal activity on the PET images. No hypermetabolism is demonstrated in the upper abdominal/gastrohepatic ligament lymph node although the small size of this lymph node  may be below threshold for detection on PET imaging. 2. No evidence for distant hypermetabolic metastatic disease in the neck, chest, abdomen, or pelvis.      10/26/2016 Pathology Results    Esophagogastric junction, biopsy, mass - INVASIVE ADENOCARCINOMA.      10/26/2016 Procedure    EUS showed uT3N1 disease, and biopsy confirmed adenocarcinoma       10/30/2016 - 12/07/2016 Radiation Therapy    Neoadjuvant radiation to esophageal cancer  Under the care of Dr. [name]      10/31/2016 - 12/07/2016 Neo-Adjuvant Chemotherapy    Weekly Carboplatin AUC 2 and taxol 50mg /m2 with concurrent radiation        12/14/2016 Imaging    CT AP W Contrast 12/14/16 IMPRESSION: 1. Mildly dilated short segment of proximal jejunum without bowel wall thickening or significant inflammatory changes. Findings may represent focal ileus or radiation enteritis. No evidence for obstruction. 2. Significantly improved appearance of the distal esophagus/proximal stomach with decreased wall thickening (1.1 cm from 2.3 cm), and smaller paraesophageal and gastrohepatic ligament lymph nodes. 3. No bowel obstruction.  Normal appendix.      01/18/2017 Imaging    CT CAP W Contrast 01/18/17 IMPRESSION: 1. Mild stable distal esophageal wall thickening likely due to radiation change. No findings for recurrent tumor. Small paraesophageal lymph nodes are also stable. 2. No findings for metastatic disease. 3. Stable mild/early emphysematous changes and age advanced atherosclerotic calcifications involving the thoracic and abdominal aorta and branch vessels.      04/12/2017 Imaging    CT Chest and Abdomen W Contrast 04/12/17  IMPRESSION: 1. Mild stable distal esophageal wall thickening. No findings  for recurrent tumor. 2. Stable small mediastinal and left supraclavicular lymph nodes. 3. There is a tiny nodule within the medial right lower lobe measuring 3 mm. New from previous exam. Nonspecific. Attention on follow-up  imaging advise. 4.  Aortic Atherosclerosis (ICD10-I70.0). 5. Three vessel coronary artery calcification      04/16/2017 Surgery    VIDEO BRONCHOSCOPY, Transhiatal Total Esophagectomy, Esophagogastrostomy, pyloromotomy, Feeding Jejunostomy ESOPHAGECTOMY COMPLETE,Transhiatal total esophagectomy JEJUNOSTOMY,Feeding by Dr. [name] 04/16/17      04/16/2017 Pathology Results    Diagnosis 04/16/17  1. Omentum, resection for tumor - BENIGN ADIPOSE TISSUE CONSISTENT WITH OMENTUM. - NO EVIDENCE OF MALIGNANCY. 2. Esophagus, resection, w/ GE junction - FIBROSIS WITH PATCHY CHRONIC INFLAMMATION. - NO RESIDUAL CARCINOMA IDENTIFIED. - MARGINS NOT INVOLVED. - TWELVE LYMPH NODES WITH NO METASTATIC CARCINOMA (0/12).       07/16/2017 Procedure    Upper GI Endoscopy Findings: per Dr. [name] - Food was found in the upper third of the esophagus. Removal of food was accomplished with Jabier Mutton net. - A partial esophagectomy anastomosis was found in the upper third of the esophagus (20 cm from incisors). This was characterized by severe stenosis, an intact staple line and visible sutures. The standard adult upper endoscope would not pass before dilation. A TTS dilator was passed through the scope. Dilation with an 8.5-9.5-10.5 mm and then 06-11-12 mm balloon dilator was performed to 12 mm (inspection after 9.5 mm, 10.5 mm, 11 mm and 12 mm). The dilation site was examined and showed moderate and significant improvement in luminal narrowing. Estimated blood loss was minimal. After dilation to 12 mm the endoscope was able to transverse the anastomosis with minimal pressure. - A medium amount of food (residue) was found in the gastric body. - The exam of the stomach was otherwise normal. - The examined duodenum was normal.      08/13/2017 Procedure    Upper GI Endoscopy Findings: per Dr. [name] - Food was found in the proximal esophagus. Removal of food was accomplished with Jabier Mutton net. - One severe (stenosis; an  endoscope cannot pass) benign-appearing, intrinsic stenosis was found 20 cm from the incisors. This measured 1 cm (in length) and was traversed after dilation. A TTS dilator was passed through the scope. Dilation with an 06-11-12 mm balloon dilator was performed to 13 mm (after 70mm and 12 mm). The dilation site was examined and showed moderate improvement in luminal narrowing. At the stricture there is a visible staple on the proximal side and visible suture material on the gastric side. - A medium amount of food (residue) -the duodenum was normal       08/31/2017 Procedure    Endoscopic needle-knife excision of anastomotic stricture by Dr. Lysle Rubens at [facility]      09/11/2017 Imaging    CT CAP W CONTRAST IMPRESSION: 1. Interval esophagectomy with gastric pull-through. No demonstrated complication. There is retained ingested material within the intrathoracic portion of the stomach. 2. No evidence of local recurrence or metastatic disease. 3. Stable small left supraclavicular and superior mediastinal lymph nodes. 4. Mild lower lobe paramediastinal pulmonary opacity bilaterally, likely atelectasis or sequela of prior radiation therapy. 5.  Aortic Atherosclerosis (ICD10-I70.0).      CURRENT THERAPY: observation   INTERVAL HISTORY: Mr. Manas returns for follow up as scheduled. He saw Dr. [name] this week. Has had 2 esophageal dilation procedures with Dr. [name] since last f/u. More recently he was seen at [facility] for endoscopic needle-knife excision of anastomotic stricture by Dr. Lysle Rubens. Dysphagia continues  to improve, has occasional difficulty with certain foods such as meat. He periodically regurgitates food if he eats too quickly or feels too full. Occasional mild epigastric pain, 3/10 controlled with tylenol PRN. Drinking liquids without difficulty. Good appetite, no weight loss. Having regular BM. Chronic productive cough with clear sputum is unchanged, no fever, chills, chest pain, or dyspnea.  Continues to have muscle spasm, managed with flexeril. He had an episode where he lost track of time and couldn't remember how to get home one day after endoscopic procedure, he thinks the anesthesia and procedure meds were still in his system. No associated syncope, chest pain, weakness, slurred speech, or other symptoms.   REVIEW OF SYSTEMS:   Constitutional: Denies fevers, chills or abnormal weight loss (+) good appetite Eyes: Denies blurriness of vision Ears, nose, mouth, throat, and face: Denies mucositis or sore throat Respiratory: Denies dyspnea or wheezes (+) chronic cough, clear sputum Cardiovascular: Denies palpitation, chest discomfort or lower extremity swelling Gastrointestinal:  Denies nausea, constipation, diarrhea, heartburn or change in bowel habits (+) intermittent mild epigastric pain (+) occasional food regurgitation if eating too quickly or feels too full (+) periodic dysphasia with meat, dysphasia is improving overall Skin: Denies abnormal skin rashes Lymphatics: Denies new lymphadenopathy or easy bruising Neurological:Denies numbness, tingling or new weaknesses Behavioral/Psych: Mood is stable, no new changes  All other systems were reviewed with the patient and are negative.  MEDICAL HISTORY:  Past Medical History:  Diagnosis Date  . Allergy   . Arthritis   . Asthma    as a child  . Cancer (HCC)    ESOPHAGUS CARCINOMA   . COPD (chronic obstructive pulmonary disease) ([place])   . CP (cerebral palsy), spastic ([place])    right  . Dysphagia   . Encounter for nonprocreative genetic counseling 10/31/2016   Mr. Castrillo underwent genetic counseling for hereditary cancer syndromes on 10/31/2016. Though he is a candidate for genetic testing, he declines at this time.  Marland Kitchen GERD (gastroesophageal reflux disease)   . Hypertension   . Neuromuscular disorder ([place])   . Pneumonia 4 yrs ago    SURGICAL HISTORY: Past Surgical History:  Procedure Laterality Date  . COMPLETE  ESOPHAGECTOMY N/A 04/16/2017   Procedure: ESOPHAGECTOMY COMPLETE,Transhiatal total esophagectomy;  Surgeon: [name], MD;  Location: [facility] OR;  Service: Thoracic;  Laterality: N/A;  . EUS N/A 10/26/2016   Procedure: UPPER ENDOSCOPIC ULTRASOUND (EUS) LINEAR;  Surgeon: [name], MD;  Location: [facility] ENDOSCOPY;  Service: Endoscopy;  Laterality: N/A;  . EYE SURGERY Bilateral age 49    for cross eyes  . IR FLUORO GUIDE PORT INSERTION RIGHT  11/08/2016  . IR US GUIDE VASC ACCESS RIGHT  11/08/2016  . JEJUNOSTOMY N/A 04/16/2017   Procedure: Donney Rankins;  Surgeon: [name], MD;  Location: [facility];  Service: Thoracic;  Laterality: N/A;  . MOUTH SURGERY    . VIDEO BRONCHOSCOPY N/A 04/16/2017   Procedure: VIDEO BRONCHOSCOPY, Transhiatal Total Esophagectomy, Esophagogastrostomy, pyloromotomy, Feeding Jejunostomy;  Surgeon: [name], MD;  Location: [facility] OR;  Service: Thoracic;  Laterality: N/A;    I have reviewed the social history and family history with the patient and they are unchanged from previous note.  ALLERGIES:  is allergic to penicillins.  MEDICATIONS:  Current Outpatient Medications  Medication Sig Dispense Refill  . acetaminophen (TYLENOL) 160 MG/5ML solution Take 10.2 mLs (325 mg total) by mouth every 4 (four) hours as needed for moderate pain. 120 mL 0  . albuterol (ACCUNEB)  0.63 MG/3ML nebulizer solution Take 1 ampule by nebulization every 6 (six) hours as needed for wheezing.    . cyclobenzaprine (FLEXERIL) 5 MG tablet TAKE 1 TABLET BY MOUTH THREE TIMES A DAY AS NEEDED FOR MUSCLE SPASMS 30 tablet 0  . fluticasone (FLONASE) 50 MCG/ACT nasal spray Place 1 spray into both nostrils daily as needed for allergies or rhinitis.    . fluticasone (FLOVENT DISKUS) 50 MCG/BLIST diskus inhaler Inhale into the lungs.    . lidocaine-prilocaine (EMLA) cream Apply 1 application topically as needed. Apply to portacath  1 1/2 hours - 2 hours prior to procedures as needed. 30 g  1  . metoprolol tartrate (LOPRESSOR) 25 MG tablet Take 0.5 tablets (12.5 mg total) by mouth 2 (two) times daily. 60 tablet 1  . Electrolyte-S (ISOLYTE-S IV) Inject into the vein. Drinks orally use to use through feeding tube.    . lactose free nutrition (BOOST) LIQD Take 237 mLs 2 (two) times daily between meals by mouth.    . metoprolol succinate (TOPROL-XL) 25 MG 24 hr tablet Take 25 mg by mouth.     Current Facility-Administered Medications  Medication Dose Route Frequency Provider Last Rate Last Dose  . 0.9 %  sodium chloride infusion  500 mL Intravenous Once Pyrtle, [name], MD      . 0.9 %  sodium chloride infusion  500 mL Intravenous Continuous Pyrtle, [name], MD        PHYSICAL EXAMINATION: ECOG PERFORMANCE STATUS: 1 - Symptomatic but completely ambulatory  Vitals:   09/14/17 1021  BP: 125/72  Pulse: 84  Resp: 18  Temp: 98 F (36.7 C)  SpO2: 100%   Filed Weights   09/14/17 1021  Weight: 123 lb 8 oz (56 kg)    GENERAL:alert, no distress and comfortable SKIN: skin color, texture, turgor are normal, no rashes or significant lesions EYES: normal, Conjunctiva are pink and non-injected, sclera clear OROPHARYNX:no exudate, no erythema and lips, buccal mucosa, and tongue normal  NECK: supple, thyroid normal size, non-tender, without nodularity (+) left neck incision is well-healed LYMPH:  no palpable lymphadenopathy in the cervical, axillary or inguinal LUNGS: clear to auscultation after coughing, normal breathing effort HEART: regular rate & rhythm and no murmurs and no lower extremity edema ABDOMEN:abdomen soft, non-tender and normal bowel sounds.  Midline abdominal incision is well-healed Musculoskeletal:no cyanosis of digits and no clubbing  NEURO: alert & oriented x 3 with fluent speech, no focal motor/sensory deficits  LABORATORY DATA:  I have reviewed the data as listed CBC Latest Ref Rng & Units 09/14/2017 06/14/2017 04/26/2017  WBC 4.0 - 10.3 K/uL 5.5 5.6 10.3    Hemoglobin 13.0 - 17.1 g/dL 12.1(L) 12.1(L) 9.7(L)  Hematocrit 38.4 - 49.9 % 36.3(L) 37.4(L) 30.1(L)  Platelets 140 - 400 K/uL 204 191 305     CMP Latest Ref Rng & Units 09/14/2017 06/14/2017 04/26/2017  Glucose 70 - 140 mg/dL 115 101 133(H)  BUN 7 - 26 mg/dL 16 10.2 15  Creatinine 0.70 - 1.30 mg/dL 0.63(L) 0.7 0.49(L)  Sodium 136 - 145 mmol/L 141 137 133(L)  Potassium 3.5 - 5.1 mmol/L 3.9 3.9 4.2  Chloride 98 - 109 mmol/L 108 - 100(L)  CO2 22 - 29 mmol/L 24 22 26   Calcium 8.4 - 10.4 mg/dL 9.4 9.5 8.6(L)  Total Protein 6.4 - 8.3 g/dL 6.5 7.4 -  Total Bilirubin 0.2 - 1.2 mg/dL 0.2 0.38 -  Alkaline Phos 40 - 150 U/L 136 132 -  AST 5 -  34 U/L 29 22 -  ALT 0 - 55 U/L 31 26 -   PATHOLOGY:  Diagnosis 04/16/17  1. Omentum, resection for tumor - BENIGN ADIPOSE TISSUE CONSISTENT WITH OMENTUM. - NO EVIDENCE OF MALIGNANCY. 2. Esophagus, resection, w/ GE junction - FIBROSIS WITH PATCHY CHRONIC INFLAMMATION. - NO RESIDUAL CARCINOMA IDENTIFIED. - MARGINS NOT INVOLVED. - TWELVE LYMPH NODES WITH NO METASTATIC CARCINOMA (0/12). Microscopic Comment 2. ESOPHAGUS: Specimen: Esophagus with GE junction. Procedure: Esophagogastrectomy. Tumor Site: No residual tumor identified in resection specimen. Relationship of Tumor to esophagogastric junction: Area of fibrosis located at esophagogastric junction. Distance of tumor center from esophagogastric junction: N/A. Tumor Size Greatest dimension: N/A. Histologic Type: Invasive adenocarcinoma, see previous esophagogastric junction biopsy ([id number]). Histologic Grade: See [id number]. Microscopic Tumor Extension: No residual carcinoma in resection specimen. Margins: Free of tumor. Proximal Margin: Free of tumor. Distal Margin: Free of tumor. Circumferential (Adventitial) Margin: Free of tumor. Deep Margin (endoscopic resection specimens): Free of tumor. If margins uninvolved - Distance of invasive carcinoma from closest margin: N/A. Specify  margin: N/A. Treatment Effect: Present. Lymph-Vascular Invasion: Not identified. Perineural Invasion: Not identified. Lymph nodes: number examined 12; number positive: 0, see comment. TNM: ypT0, ypN0. Ancillary studies: N/A. 1 of 3 FINAL for CHELSEA, NUSZ [phone]) Microscopic Comment(continued) Comments: There is a 3 cm area at the gastroesophageal junction which shows fibrosis with hyperemia and patchy chronic inflammation. No residual carcinoma is identified. There are numerous foamy histiocytes in areas of fibrosis with occasional giant cells in six of the twelve lymph nodes. No viable metastatic carcinoma is identified in any of the twelve lymph nodes. (JDP:ah 04/18/17)   Diagnosis 10/26/2016 Esophagogastric junction, biopsy, mass - INVASIVE ADENOCARCINOMA.  Diagnosis 10/09/2016 Esophagus, biopsy, distal esophageal tumor (33-39) - SUSPICIOUS FOR ADENOCARCINOMA, SEE COMMENT.Marland Kitchen    RADIOGRAPHIC STUDIES: I have personally reviewed the radiological images as listed and agreed with the findings in the report. No results found.   ASSESSMENT & PLAN: [place] is a 57 y.o. Caucasian male with PMH cerebral palsy and heavy smoking, who presents with dysphagia, odynophagia and weight loss   1. Low esophageal Adenocarcinoma, cT3N1M0, stage III, ypT0N0  2. History of heavy smoking 3. Muscle spasms in extremities, controlled with flexeril PRN 4. Malnutrition and weight loss  Mr. Hollenbeck is clinically doing well, dysphagia is improving overall s/p endoscopic resection of anastomotic stricture. He continues to gain weight.  Reviewed his surveillance CT which shows interval esophagectomy with gastric pull-through, no demonstration of complication and no evidence of local recurrence or metastatic disease.  Small left supraclavicular and superior mediastinal lymph nodes are stable.  There is mild paramediastinal pulmonary opacities bilaterally, likely atelectasis or sequela of prior  radiation therapy. Will monitor closely.  Vital signs stable.  CBC stable with mild anemia, Hgb 12.1.  CMP unremarkable, CEA remains normal, 0.04 today. Physical exam unremarkable.  Will continue observation.  He will follow-up with dietitian today.  Scheduled to follow-up with Dr. [name] in 4/19 and Dr. [name] in 5/19. Return to clinic for lab and follow-up in 4 months.  PLAN -Labs and scan reviewed, no evidence of recurrence; continue observation -Return for lab, f/u with Dr. Burr Medico in 4 months  -F/u with Dr. [name] 10/2017 and [name] 11/2017 -F/u with dietician today  All questions were answered. The patient knows to call the clinic with any problems, questions or concerns. No barriers to learning was detected.    Alla Feeling, NP 09/14/17

## 2017-09-14 NOTE — Progress Notes (Signed)
Nutrition follow-up completed with patient status post treatment for esophageal adenocarcinoma. Patient denies nutrition Impact symptoms. He is tolerating soft foods. He has not been drinking any oral nutrition supplements. Current weight documented as 123.5 pounds increased from 121 pounds. Patient reports he has a goal weight of 140 pounds.  Nutrition diagnosis: Inadequate oral intake improved  Intervention: I educated patient to continue small frequent high-calorie high-protein foods that are soft and easy to swallow. Recommended patient resume Ensure Plus, at least 1 bottle daily. Provided coupons. Questions were answered.  Teach back method used.  Contact information provided.  Monitoring evaluation goals: Patient will tolerate increased calories and protein to promote continued weight gain.  Next visit: To be scheduled as needed.  **Disclaimer: This note was dictated with voice recognition software. Similar sounding words can inadvertently be transcribed and this note may contain transcription errors which may not have been corrected upon publication of note.**

## 2017-09-14 NOTE — Telephone Encounter (Signed)
Printed avs and calender for upcoming appointment. Per 2/15 los

## 2017-09-24 ENCOUNTER — Other Ambulatory Visit: Payer: Self-pay | Admitting: Hematology

## 2017-10-01 ENCOUNTER — Ambulatory Visit (AMBULATORY_SURGERY_CENTER): Payer: Self-pay

## 2017-10-01 ENCOUNTER — Other Ambulatory Visit: Payer: Self-pay

## 2017-10-01 ENCOUNTER — Telehealth: Payer: Self-pay | Admitting: Internal Medicine

## 2017-10-01 VITALS — Ht 60.0 in | Wt 119.8 lb

## 2017-10-01 DIAGNOSIS — R1310 Dysphagia, unspecified: Secondary | ICD-10-CM

## 2017-10-01 NOTE — Telephone Encounter (Signed)
Pt scheduled for previsit today at 3:30pm, EGD with dil scheduled in the [place] 10/03/17@8am . Pt aware of appts.

## 2017-10-01 NOTE — Telephone Encounter (Signed)
Can get him in for EGD for dilation, next available

## 2017-10-01 NOTE — Telephone Encounter (Signed)
Pt states Dr. [name] told him if he started having difficultly swallowing after the [facility] procedure he needed to call Dr. [name] asap to have the area stretched again. Pt reports he is having difficulty swallowing again, he is now down to 118lbs. Reports it is hit or miss as to if he can swallow or not. Reports at times he regurgitates and starts all over again. Please advise.

## 2017-10-01 NOTE — Progress Notes (Signed)
No egg or soy allergy known to patient  No issues with past sedation with any surgeries  or procedures, no intubation problems  No diet pills per patient No home 02 use per patient  No blood thinners per patient  Pt denies issues with constipation  No A fib or A flutter  EMMI video sent to pt's e mail pt declined already seen it

## 2017-10-02 ENCOUNTER — Other Ambulatory Visit: Payer: Self-pay | Admitting: *Deleted

## 2017-10-02 MED ORDER — CYCLOBENZAPRINE HCL 5 MG PO TABS: ORAL_TABLET | ORAL | 0 refills | Status: DC

## 2017-10-03 ENCOUNTER — Ambulatory Visit (AMBULATORY_SURGERY_CENTER): Payer: Medicaid Other | Admitting: Internal Medicine

## 2017-10-03 ENCOUNTER — Encounter: Payer: Self-pay | Admitting: Internal Medicine

## 2017-10-03 ENCOUNTER — Other Ambulatory Visit: Payer: Self-pay

## 2017-10-03 VITALS — BP 100/60 | HR 80 | Temp 98.6°F | Resp 18 | Ht 60.0 in | Wt 119.0 lb

## 2017-10-03 DIAGNOSIS — K222 Esophageal obstruction: Secondary | ICD-10-CM

## 2017-10-03 DIAGNOSIS — R1310 Dysphagia, unspecified: Secondary | ICD-10-CM | POA: Diagnosis not present

## 2017-10-03 MED ORDER — SODIUM CHLORIDE 0.9 % IV SOLN: 500.0000 mL | Freq: Once | INTRAVENOUS | Status: DC

## 2017-10-03 NOTE — Progress Notes (Signed)
Pt's states no medical or surgical changes since previsit or office visit. 

## 2017-10-03 NOTE — Op Note (Signed)
[place] Patient Name: Darrell Cunningham Procedure Date: 10/03/2017 7:55 AM MRN: [id number] Endoscopist: [name] , MD Age: 57 Referring MD:  Date of Birth: November 03, 1960 Gender: Male Account #: [id number] Procedure:                Upper GI endoscopy Indications:              Dysphagia, Management of operative complication:                            Dilation of anastomotic stricture, history of                            esophageal cancer s/p esophagectomy with known                            esophageal stricture at 20 mm, s/p needle knife                            incision and dilation to 20 mm with Dr. Lysle Rubens at                            [facility] on 08/31/17, recurrent dysphagia within 4 weeks Medicines:                Monitored Anesthesia Care Procedure:                Pre-Anesthesia Assessment:                           - Prior to the procedure, a History and Physical                            was performed, and patient medications and                            allergies were reviewed. The patient's tolerance of                            previous anesthesia was also reviewed. The risks                            and benefits of the procedure and the sedation                            options and risks were discussed with the patient.                            All questions were answered, and informed consent                            was obtained. Prior Anticoagulants: The patient has                            taken no previous anticoagulant or antiplatelet  agents. ASA Grade Assessment: III - A patient with                            severe systemic disease. After reviewing the risks                            and benefits, the patient was deemed in                            satisfactory condition to undergo the procedure.                           After obtaining informed consent, the endoscope was                            passed under  direct vision. Throughout the                            procedure, the patient's blood pressure, pulse, and                            oxygen saturations were monitored continuously. The                            Endoscope was introduced through the mouth, and                            advanced to the second part of duodenum. The upper                            GI endoscopy was accomplished without difficulty.                            The patient tolerated the procedure well. Scope In: Scope Out: Findings:                 One severe (stenosis; an endoscope cannot pass)                            benign-appearing, intrinsic stenosis was found 20                            cm from the incisors. This measured 7 mm (inner                            diameter) x 1 cm (in length) and was traversed                            after dilation. A TTS dilator was passed through                            the scope. Dilation with a 13.5-14.5-15.5 mm  balloon dilator was performed to 13.5 mm. The                            dilation site was examined and showed moderate                            improvement in luminal narrowing. Further dilation                            not performed due to significant 2 mucosal rents on                            opposing walls at the level of the stricture.                            Staple visible.                           Suspect gastroparesis due to retained gastric                            contents. Suture also seen in proximal stomach.                           Normal mucosa was found in the entire examined                            stomach.                           The examined duodenum was normal. Complications:            No immediate complications. Estimated Blood Loss:     Estimated blood loss was minimal. Impression:               - Benign-appearing esophageal stenosis. Dilated.                           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                           - Normal mucosa was found in the entire stomach.                           - Normal examined duodednum.                           - No specimens collected. Recommendation:           - Patient has a contact number available for                            emergencies. The signs and symptoms of potential                            delayed complications were discussed with the  patient. Return to normal activities tomorrow.                            Written discharge instructions were provided to the                            patient.                           - Soft diet.                           - Continue present medications.                           - Repeat upper endoscopy in 3 weeks for retreatment.                           - I will discuss with colleagues other potential                            options including esophageal stent. [name], MD 10/03/2017 8:28:49 AM This report has been signed electronically.

## 2017-10-03 NOTE — Patient Instructions (Signed)
Post eYOU HAD AN ENDOSCOPIC PROCEDURE TODAY AT [facility]:   Refer to the procedure report that was given to you for any specific questions about what was found during the examination.  If the procedure report does not answer your questions, please call your gastroenterologist to clarify.  If you requested that your care partner not be given the details of your procedure findings, then the procedure report has been included in a sealed envelope for you to review at your convenience later.  YOU SHOULD EXPECT: Some feelings of bloating in the abdomen. Passage of more gas than usual.  Walking can help get rid of the air that was put into your GI tract during the procedure and reduce the bloating. If you had a lower endoscopy (such as a colonoscopy or flexible sigmoidoscopy) you may notice spotting of blood in your stool or on the toilet paper. If you underwent a bowel prep for your procedure, you may not have a normal bowel movement for a few days.  Please Note:  You might notice some irritation and congestion in your nose or some drainage.  This is from the oxygen used during your procedure.  There is no need for concern and it should clear up in a day or so.  SYMPTOMS TO REPORT IMMEDIATELY:   Following upper endoscopy (EGD)  Vomiting of blood or coffee ground material  New chest pain or pain under the shoulder blades  Painful or persistently difficult swallowing  New shortness of breath  Fever of 100F or higher  Black, tarry-looking stools  For urgent or emergent issues, a gastroenterologist can be reached at any hour by calling [phone].   DIET:  We do recommend a small meal at first, but then you may proceed to your regular diet.  Drink plenty of fluids but you should avoid alcoholic beverages for 24 hours.  ACTIVITY:  You should plan to take it easy for the rest of today and you should NOT DRIVE or use heavy machinery until tomorrow (because of the sedation medicines  used during the test).    FOLLOW UP: Our staff will call the number listed on your records the next business day following your procedure to check on you and address any questions or concerns that you may have regarding the information given to you following your procedure. If we do not reach you, we will leave a message.  However, if you are feeling well and you are not experiencing any problems, there is no need to return our call.  We will assume that you have returned to your regular daily activities without incident.  If any biopsies were taken you will be contacted by phone or by letter within the next 1-3 weeks.  Please call us at [phone] if you have not heard about the biopsies in 3 weeks.    SIGNATURES/CONFIDENTIALITY: You and/or your care partner have signed paperwork which will be entered into your electronic medical record.  These signatures attest to the fact that that the information above on your After Visit Summary has been reviewed and is understood.  Full responsibility of the confidentiality of this discharge information lies with you and/or your care-partner.sophageal diet given.

## 2017-10-03 NOTE — Progress Notes (Signed)
To PPACU,VSS Report to RN.tb

## 2017-10-03 NOTE — Progress Notes (Signed)
Called to room to assist during endoscopic procedure.  Patient ID and intended procedure confirmed with present staff. Received instructions for my participation in the procedure from the performing physician.  

## 2017-10-04 ENCOUNTER — Other Ambulatory Visit: Payer: Self-pay

## 2017-10-04 ENCOUNTER — Telehealth: Payer: Self-pay | Admitting: *Deleted

## 2017-10-04 MED ORDER — METOPROLOL TARTRATE 25 MG PO TABS: 12.5000 mg | ORAL_TABLET | Freq: Two times a day (BID) | ORAL | 1 refills | Status: DC

## 2017-10-04 NOTE — Telephone Encounter (Signed)
  Follow up Call-  Call back number 10/03/2017 08/13/2017 07/16/2017 10/09/2016  Post procedure Call Back phone  # [phone]  Permission to leave phone message Yes Yes Yes Yes  Some recent data might be hidden     Patient questions:  Do you have a fever, pain , or abdominal swelling? No. Pain Score  0 *  Have you tolerated food without any problems? Yes.    Have you been able to return to your normal activities? Yes.    Do you have any questions about your discharge instructions: Diet   No. Medications  No. Follow up visit  No.  Do you have questions or concerns about your Care? No.  Actions: * If pain score is 4 or above: No action needed, pain <4.

## 2017-10-05 ENCOUNTER — Ambulatory Visit: Payer: Medicaid Other | Admitting: Family Medicine

## 2017-10-05 ENCOUNTER — Encounter: Payer: Self-pay | Admitting: Family Medicine

## 2017-10-05 VITALS — BP 100/62 | HR 72 | Temp 98.0°F | Resp 12 | Ht 60.0 in | Wt 122.0 lb

## 2017-10-05 DIAGNOSIS — I952 Hypotension due to drugs: Secondary | ICD-10-CM

## 2017-10-05 NOTE — Progress Notes (Signed)
Subjective:    Patient ID: Darrell Cunningham, male    DOB: May 23, 1961, 57 y.o.   MRN: [id number]  HPI  Since I last saw the patient, he has lost more than 20 pounds as he is battling esophageal cancer.  He has undergone surgical resection. Unfortunately he is dealing with strictures and dysphagia which is causing him to not be able to eat. This is lead to weight loss. Somewhere over the last few months, he was started on metoprolol. The first mention I can find in his chart was during his hospitalization in September. I am unsure as to the reason why however he is taking the medication and his blood pressure is extremely low. Today it's 100/62. During her recent hospital visit, his systolic blood pressure was as low as 83.  Patient reports at times he feels dizzy, lightheaded, and confused. I am concerned that this could be due to cerebral hypoperfusion due to low blood pressure. He denies any syncope but he does report near syncope at times. Past Medical History:  Diagnosis Date  . Allergy   . Arthritis   . Asthma    as a child  . Cancer (HCC)    ESOPHAGUS CARCINOMA   . COPD (chronic obstructive pulmonary disease) ([place])   . CP (cerebral palsy), spastic ([place])    right  . Dysphagia   . Emphysema of lung ([place])   . Encounter for nonprocreative genetic counseling 10/31/2016   Mr. Barrette underwent genetic counseling for hereditary cancer syndromes on 10/31/2016. Though he is a candidate for genetic testing, he declines at this time.  Marland Kitchen GERD (gastroesophageal reflux disease)   . Hypertension   . Neuromuscular disorder ([place])   . Pneumonia 4 yrs ago   Past Surgical History:  Procedure Laterality Date  . COMPLETE ESOPHAGECTOMY N/A 04/16/2017   Procedure: ESOPHAGECTOMY COMPLETE,Transhiatal total esophagectomy;  Surgeon: [name], MD;  Location: [facility] OR;  Service: Thoracic;  Laterality: N/A;  . EUS N/A 10/26/2016   Procedure: UPPER ENDOSCOPIC ULTRASOUND (EUS) LINEAR;  Surgeon: [name], MD;  Location: [facility];  Service: Endoscopy;  Laterality: N/A;  . EYE SURGERY Bilateral age 57    for cross eyes  . IR FLUORO GUIDE PORT INSERTION RIGHT  11/08/2016  . IR US GUIDE VASC ACCESS RIGHT  11/08/2016  . JEJUNOSTOMY N/A 04/16/2017   Procedure: Donney Rankins;  Surgeon: [name], MD;  Location: [place];  Service: Thoracic;  Laterality: N/A;  . MOUTH SURGERY    . VIDEO BRONCHOSCOPY N/A 04/16/2017   Procedure: VIDEO BRONCHOSCOPY, Transhiatal Total Esophagectomy, Esophagogastrostomy, pyloromotomy, Feeding Jejunostomy;  Surgeon: [name], MD;  Location: [facility] OR;  Service: Thoracic;  Laterality: N/A;   Current Outpatient Medications on File Prior to Visit  Medication Sig Dispense Refill  . acetaminophen (TYLENOL) 160 MG/5ML solution Take 10.2 mLs (325 mg total) by mouth every 4 (four) hours as needed for moderate pain. 120 mL 0  . albuterol (ACCUNEB) 0.63 MG/3ML nebulizer solution Take 1 ampule by nebulization every 6 (six) hours as needed for wheezing.    . cyclobenzaprine (FLEXERIL) 5 MG tablet TAKE 1 TABLET BY MOUTH THREE TIMES A DAY AS NEEDED FOR MUSCLE SPASMS 30 tablet 0  . Electrolyte-S (ISOLYTE-S IV) Inject into the vein. Drinks orally use to use through feeding tube.    . fluticasone (FLONASE) 50 MCG/ACT nasal spray Place 1 spray into both nostrils daily as needed for allergies or rhinitis.    . fluticasone (FLOVENT DISKUS)  50 MCG/BLIST diskus inhaler Inhale into the lungs.    . lactose free nutrition (BOOST) LIQD Take 237 mLs 2 (two) times daily between meals by mouth.    . lidocaine-prilocaine (EMLA) cream Apply 1 application topically as needed. Apply to portacath  1 1/2 hours - 2 hours prior to procedures as needed. 30 g 1  . metoprolol tartrate (LOPRESSOR) 25 MG tablet Take 0.5 tablets (12.5 mg total) by mouth 2 (two) times daily. 90 tablet 1   Current Facility-Administered Medications on File Prior to Visit  Medication Dose Route Frequency  Provider Last Rate Last Dose  . 0.9 %  sodium chloride infusion  500 mL Intravenous Once Pyrtle, [name], MD      . 0.9 %  sodium chloride infusion  500 mL Intravenous Continuous Pyrtle, [name], MD      . 0.9 %  sodium chloride infusion  500 mL Intravenous Once Pyrtle, [name], MD       Allergies  Allergen Reactions  . Penicillins Hives, Itching and Swelling    Has patient had a PCN reaction causing immediate rash, facial/tongue/throat swelling, SOB or lightheadedness with hypotension: No Has patient had a PCN reaction causing severe rash involving mucus membranes or skin necrosis: No Has patient had a PCN reaction that required hospitalization No Has patient had a PCN reaction occurring within the last 10 years: No If all of the above answers are "NO", then may proceed with Cephalosporin use. Unknown Childhood reaction   Social History   Socioeconomic History  . Marital status: Legally Separated    Spouse name: Not on file  . Number of children: Not on file  . Years of education: Not on file  . Highest education level: Not on file  Social Needs  . Financial resource strain: Not on file  . Food insecurity - worry: Not on file  . Food insecurity - inability: Not on file  . Transportation needs - medical: Not on file  . Transportation needs - non-medical: Not on file  Occupational History  . Not on file  Tobacco Use  . Smoking status: Former Smoker    Packs/day: 1.00    Years: 38.00    Pack years: 38.00    Types: Cigarettes    Start date: 10/31/2012    Last attempt to quit: 10/31/2012    Years since quitting: 4.9  . Smokeless tobacco: Never Used  Substance and Sexual Activity  . Alcohol use: No    Alcohol/week: 0.0 oz  . Drug use: No  . Sexual activity: Not on file  Other Topics Concern  . Not on file  Social History Narrative  . Not on file     Review of Systems  All other systems reviewed and are negative.      Objective:   Physical Exam  Neck: Neck supple.    Cardiovascular: Normal rate, regular rhythm and normal heart sounds.  Pulmonary/Chest: Effort normal and breath sounds normal. No stridor. No respiratory distress. He has no wheezes. He has no rales.  Abdominal: Soft. Bowel sounds are normal. He exhibits no distension. There is no tenderness. There is no rebound and no guarding.  Lymphadenopathy:    He has no cervical adenopathy.  Vitals reviewed.         Assessment & Plan:  Hypotension due to drugs  Discontinue metoprolol due to hypotension. Monitor for improvement of his near syncope and lightheadedness hopefully with improved cerebral perfusion. If patient continues to experience near syncope or  TIA-like symptoms, I would institute a workup including carotid Dopplers, echocardiogram, and Holter monitor but I believe the majority of his symptoms are due to hypotension. I reviewed his recent cbc and cmp which were relatively normal.

## 2017-10-05 NOTE — Addendum Note (Signed)
Addended by: Jenna Luo T on: 10/05/2017 12:00 PM   Modules accepted: Orders

## 2017-10-18 ENCOUNTER — Other Ambulatory Visit: Payer: Self-pay

## 2017-10-18 ENCOUNTER — Ambulatory Visit (AMBULATORY_SURGERY_CENTER): Payer: Self-pay | Admitting: *Deleted

## 2017-10-18 VITALS — Ht 60.0 in | Wt 118.0 lb

## 2017-10-18 DIAGNOSIS — R1319 Other dysphagia: Secondary | ICD-10-CM

## 2017-10-18 DIAGNOSIS — R1310 Dysphagia, unspecified: Secondary | ICD-10-CM

## 2017-10-18 NOTE — Progress Notes (Signed)
No egg or soy allergy known to patient  No issues with past sedation with any surgeries  or procedures, no intubation problems  No diet pills per patient No home 02 use per patient  No blood thinners per patient  Pt denies issues with constipation  No A fib or A flutter  EMMI video sent to pt's e mail patient comes in monthly for EGD Declined

## 2017-10-25 ENCOUNTER — Encounter: Payer: Self-pay | Admitting: Internal Medicine

## 2017-10-25 ENCOUNTER — Ambulatory Visit (AMBULATORY_SURGERY_CENTER): Payer: Medicaid Other | Admitting: Internal Medicine

## 2017-10-25 ENCOUNTER — Other Ambulatory Visit: Payer: Self-pay

## 2017-10-25 VITALS — BP 119/60 | HR 84 | Temp 98.4°F | Resp 19 | Ht 60.0 in | Wt 118.0 lb

## 2017-10-25 DIAGNOSIS — K222 Esophageal obstruction: Secondary | ICD-10-CM

## 2017-10-25 DIAGNOSIS — R1319 Other dysphagia: Secondary | ICD-10-CM

## 2017-10-25 DIAGNOSIS — R1310 Dysphagia, unspecified: Secondary | ICD-10-CM | POA: Diagnosis not present

## 2017-10-25 DIAGNOSIS — Z8501 Personal history of malignant neoplasm of esophagus: Secondary | ICD-10-CM

## 2017-10-25 MED ORDER — SODIUM CHLORIDE 0.9 % IV SOLN: 500.0000 mL | Freq: Once | INTRAVENOUS | Status: DC

## 2017-10-25 NOTE — Patient Instructions (Signed)
Follow soft diet. Instructions given.  YOU HAD AN ENDOSCOPIC PROCEDURE TODAY AT [facility]:   Refer to the procedure report that was given to you for any specific questions about what was found during the examination.  If the procedure report does not answer your questions, please call your gastroenterologist to clarify.  If you requested that your care partner not be given the details of your procedure findings, then the procedure report has been included in a sealed envelope for you to review at your convenience later.  YOU SHOULD EXPECT: Some feelings of bloating in the abdomen. Passage of more gas than usual.  Walking can help get rid of the air that was put into your GI tract during the procedure and reduce the bloating. If you had a lower endoscopy (such as a colonoscopy or flexible sigmoidoscopy) you may notice spotting of blood in your stool or on the toilet paper. If you underwent a bowel prep for your procedure, you may not have a normal bowel movement for a few days.  Please Note:  You might notice some irritation and congestion in your nose or some drainage.  This is from the oxygen used during your procedure.  There is no need for concern and it should clear up in a day or so.  SYMPTOMS TO REPORT IMMEDIATELY:   Following upper endoscopy (EGD)  Vomiting of blood or coffee ground material  New chest pain or pain under the shoulder blades  Painful or persistently difficult swallowing  New shortness of breath  Fever of 100F or higher  Black, tarry-looking stools  For urgent or emergent issues, a gastroenterologist can be reached at any hour by calling [phone].   DIET:  We do recommend a small meal at first, but then you may proceed to your regular diet.  Drink plenty of fluids but you should avoid alcoholic beverages for 24 hours.  ACTIVITY:  You should plan to take it easy for the rest of today and you should NOT DRIVE or use heavy machinery until tomorrow  (because of the sedation medicines used during the test).    FOLLOW UP: Our staff will call the number listed on your records the next business day following your procedure to check on you and address any questions or concerns that you may have regarding the information given to you following your procedure. If we do not reach you, we will leave a message.  However, if you are feeling well and you are not experiencing any problems, there is no need to return our call.  We will assume that you have returned to your regular daily activities without incident.  If any biopsies were taken you will be contacted by phone or by letter within the next 1-3 weeks.  Please call us at [phone] if you have not heard about the biopsies in 3 weeks.    SIGNATURES/CONFIDENTIALITY: You and/or your care partner have signed paperwork which will be entered into your electronic medical record.  These signatures attest to the fact that that the information above on your After Visit Summary has been reviewed and is understood.  Full responsibility of the confidentiality of this discharge information lies with you and/or your care-partner.

## 2017-10-25 NOTE — Progress Notes (Signed)
Report to RN, VSS, adequate respirations noted, no c/o pain or discomfort 

## 2017-10-25 NOTE — Op Note (Signed)
[facility] Patient Name: Darrell Cunningham Procedure Date: 10/25/2017 10:26 AM MRN: [id number] Endoscopist: [name] , MD Age: 57 Referring MD:  Date of Birth: 11-Apr-1961 Gender: Male Account #: [id number] Procedure:                Upper GI endoscopy Indications:              Dysphagia, Management of operative complication:                            Dilation of anastomotic stricture, last EGD 22 days                            ago with dilation to 13.5 mm with balloon Medicines:                Monitored Anesthesia Care Procedure:                Pre-Anesthesia Assessment:                           - Prior to the procedure, a History and Physical                            was performed, and patient medications and                            allergies were reviewed. The patient's tolerance of                            previous anesthesia was also reviewed. The risks                            and benefits of the procedure and the sedation                            options and risks were discussed with the patient.                            All questions were answered, and informed consent                            was obtained. Prior Anticoagulants: The patient has                            taken no previous anticoagulant or antiplatelet                            agents. ASA Grade Assessment: III - A patient with                            severe systemic disease. After reviewing the risks                            and benefits, the patient was deemed in  satisfactory condition to undergo the procedure.                           After obtaining informed consent, the endoscope was                            passed under direct vision. Throughout the                            procedure, the patient's blood pressure, pulse, and                            oxygen saturations were monitored continuously. The                            Model  [id number]-[id number]) scope was introduced                            through the mouth, and advanced to the second part                            of duodenum. The upper GI endoscopy was                            accomplished without difficulty. The patient                            tolerated the procedure well. Scope In: Scope Out: Findings:                 One severe (stenosis; an endoscope cannot pass)                            benign-appearing, intrinsic stenosis was found 20                            cm from the incisors. This measured 7 mm (inner                            diameter) x 1 cm (in length) and was traversed                            after dilation. A TTS dilator was passed through                            the scope. Dilation with a 13.5-14.5-15.5 mm                            balloon dilator was performed to 13.5 mm. The                            dilation site was examined and showed moderate  improvement in luminal narrowing with significant                            mucosal rents.                           Evidence of a previous surgical anastomosis was                            found in the cardia and in the gastric fundus. This                            was characterized by visible sutures.                           The exam of the stomach was otherwise normal.                           The examined duodenum was normal. Complications:            No immediate complications. Estimated Blood Loss:     Estimated blood loss was minimal. Impression:               - Anastomotic esophageal stenosis. Dilated to 13.5                            mm with balloon.                           - A previous surgical anastomosis was found,                            characterized by visible staple and sutures.                           - Normal examined duodenum.                           - No specimens collected. Recommendation:           -  Patient has a contact number available for                            emergencies. The signs and symptoms of potential                            delayed complications were discussed with the                            patient. Return to normal activities tomorrow.                            Written discharge instructions were provided to the                            patient.                           -  Soft diet.                           - Continue present medications.                           - Repeat upper endoscopy for dilation. Will refer                            back to Dr. Lysle Rubens to consider repeat needle-knife                            therapy versus stent placement given quick                            re-stenosis. Dilation to 13.5 mm here 22 days ago                            and today recurrent severe stenosis preventing                            dilation past 13.5 mm. If he cannot see Dr. Lysle Rubens                            within 3 weeks for repeat procedure, we can repeat                            EGD here to palliate symptoms while awaiting                            hopefully more definitive treatment. [name], MD 10/25/2017 11:01:27 AM This report has been signed electronically.

## 2017-10-25 NOTE — Progress Notes (Signed)
Pt's states no medical or surgical changes since previsit or office visit. 

## 2017-10-25 NOTE — Progress Notes (Signed)
Called to room to assist during endoscopic procedure.  Patient ID and intended procedure confirmed with present staff. Received instructions for my participation in the procedure from the performing physician.  

## 2017-10-26 ENCOUNTER — Telehealth: Payer: Self-pay | Admitting: *Deleted

## 2017-10-26 NOTE — Telephone Encounter (Signed)
  Follow up Call-  Call back number 10/25/2017 10/03/2017 08/13/2017 07/16/2017 10/09/2016  Post procedure Call Back phone  # [phone]  Permission to leave phone message Yes Yes Yes Yes Yes  Some recent data might be hidden     Patient questions:  Do you have a fever, pain , or abdominal swelling? No. Pain Score  0 *  Have you tolerated food without any problems? Yes.    Have you been able to return to your normal activities? Yes.    Do you have any questions about your discharge instructions: Diet   No. Medications  No. Follow up visit  No.  Do you have questions or concerns about your Care? No.  Actions: * If pain score is 4 or above: No action needed, pain <4.

## 2017-10-29 ENCOUNTER — Telehealth: Payer: Self-pay | Admitting: Internal Medicine

## 2017-10-29 NOTE — Telephone Encounter (Signed)
Spoke with pt and let him know that the referral was faxed to [facility] on 10/25/17. Called today to check and they did receive fax and state it is being reviewed. Left message that procedure needed to be done asap as pt feels his throat is already beginning to "close up again." Dr. [name] aware.

## 2017-10-29 NOTE — Telephone Encounter (Signed)
Pt calling regarding referral to [facility]. Please call him.

## 2017-11-08 ENCOUNTER — Ambulatory Visit (INDEPENDENT_AMBULATORY_CARE_PROVIDER_SITE_OTHER)
Admission: RE | Admit: 2017-11-08 | Discharge: 2017-11-08 | Disposition: A | Discharge status: Home / Self Care | Payer: Medicaid Other | Source: Ambulatory Visit | Attending: Internal Medicine | Admitting: Internal Medicine

## 2017-11-08 ENCOUNTER — Encounter: Payer: Self-pay | Admitting: Internal Medicine

## 2017-11-08 ENCOUNTER — Ambulatory Visit: Payer: Medicaid Other | Admitting: Internal Medicine

## 2017-11-08 VITALS — BP 90/60 | HR 92 | Ht 60.0 in | Wt 116.5 lb

## 2017-11-08 DIAGNOSIS — Z8501 Personal history of malignant neoplasm of esophagus: Secondary | ICD-10-CM

## 2017-11-08 DIAGNOSIS — K9189 Other postprocedural complications and disorders of digestive system: Secondary | ICD-10-CM

## 2017-11-08 MED ORDER — PANTOPRAZOLE SODIUM 40 MG PO TBEC: 40.0000 mg | DELAYED_RELEASE_TABLET | Freq: Every day | ORAL | 3 refills | Status: DC

## 2017-11-08 MED ORDER — SUCRALFATE 1 GM/10ML PO SUSP: 1.0000 g | Freq: Three times a day (TID) | ORAL | 1 refills | Status: DC

## 2017-11-08 NOTE — Progress Notes (Signed)
   Subjective:    Patient ID: Darrell Cunningham, male    DOB: [birth year], 57 y.o.   MRN: [id number]  HPI Darrell Cunningham is a 57 year old male with a history of esophageal adenocarcinoma status post chemotherapy, radiation and esophagectomy with gastric pull-through complicated by esophagogastric anastomotic stricture who is here for follow-up.  I last saw him on 10/25/2017 for repeat esophageal dilation.  He had quickly recurrent severe anastomotic stricture despite dilation.  I performed balloon dilation to 13.5 mm but because of recurrent stricture and severe esophageal dysphagia I referred him back to see Dr. Lysle Rubens at [facility].  On 11/04/2017 Dr. Lysle Rubens repeated upper endoscopy with needle knife incision of his anastomotic stricture followed by balloon dilation to 20 mm.  He had also previously done this on 08/31/2017 and had recurrence of the stricture.  Today he reports that he is having improvement in swallowing but still he has been very scared to eat foods that are not very very soft.  He still feels some pressure in his upper chest when he eats and drinks.  Some burning type discomfort also with eating.  No abdominal pain.  Bowel movements have been normal.  Weight has been stable but he is concerned about his progressive weight loss.  Review of Systems As per HPI, otherwise negative  Current Medications, Allergies, Past Medical History, Past Surgical History, Family History and Social History were reviewed in [employer] record.     Objective:   Physical Exam BP 90/60 (BP Location: Left Arm, Patient Position: Sitting, Cuff Size: Normal)   Pulse 92   Ht 5' (1.524 m) Comment: height measured without shoes  Wt 116 lb 8 oz (52.8 kg)   BMI 22.75 kg/m  Constitutional: Well-developed and well-nourished. No distress. HEENT: Normocephalic and atraumatic.   No scleral icterus. Neck: Neck supple. Trachea midline.  Healed incision above left clavicle with skin  firmness Cardiovascular: Normal rate, regular rhythm and intact distal pulses. Pulmonary/chest: Effort normal and breath sounds normal. No wheezing, rales or rhonchi. Abdominal: Soft, nontender, nondistended. Bowel sounds active throughout.  Extremities: no clubbing, cyanosis, or edema Neurological: Alert and oriented to person place and time. Skin: Skin is warm and dry. Psychiatric: Normal mood and affect. Behavior is normal.     Assessment & Plan:  57 year old male with a history of esophageal adenocarcinoma status post chemotherapy, radiation and esophagectomy with gastric pull-through complicated by esophagogastric anastomotic stricture who is here for follow-up.  1.  History of esophageal cancer/history of esophagectomy/gastroesophageal anastomotic ulceration --he has improved after second needle knife incision of his anastomotic stricture and balloon dilation to 20 mm.  Unfortunately his stricture has been rapidly recurrent despite frequent dilation.  Hopefully after this most recent therapy he will have more lasting result.  Due to the proximal nature of the stricture I do not think esophageal stent would be possible. --I am going to start him back on pantoprazole 40 mg daily to ensure no reflux which could result in quicker stricture formation.  Also resume Carafate suspension 1 g 3 times daily before meals and at bedtime given burning discomfort with swallowing --Chest x-ray was checked today to exclude complication and unremarkable --I gave him a high calorie protein shake, SandiShake to use with whole milk 1 or 2 times per day.  Each shake has 500 cal. --Advance diet as tolerated and I asked that he keep me informed as to how his swallowing is doing

## 2017-11-08 NOTE — Patient Instructions (Signed)
If you are age 59 or older, your body mass index should be between 23-30. Your Body mass index is 22.75 kg/m. If this is out of the aforementioned range listed, please consider follow up with your Primary Care Provider.  If you are age 42 or younger, your body mass index should be between 19-25. Your Body mass index is 22.75 kg/m. If this is out of the aformentioned range listed, please consider follow up with your Primary Care Provider.   We have sent the following medications to your pharmacy for you to pick up at your convenience: Protonix 40 mg once daily Carafate 1G before meals and at bedtime.   Your provider has requested that you have an chest x ray before leaving today. Please go to the basement floor to our Radiology department for the test.   Thank you,  Dr. [name]

## 2017-11-30 ENCOUNTER — Encounter: Payer: Self-pay | Admitting: Psychiatry

## 2017-12-10 ENCOUNTER — Other Ambulatory Visit: Payer: Self-pay

## 2017-12-10 ENCOUNTER — Encounter: Payer: Self-pay | Admitting: Cardiothoracic Surgery

## 2017-12-10 ENCOUNTER — Ambulatory Visit: Payer: Medicaid Other | Admitting: Cardiothoracic Surgery

## 2017-12-10 VITALS — BP 115/73 | HR 67 | Resp 16 | Ht 60.0 in | Wt 121.4 lb

## 2017-12-10 DIAGNOSIS — Z09 Encounter for follow-up examination after completed treatment for conditions other than malignant neoplasm: Secondary | ICD-10-CM

## 2017-12-10 DIAGNOSIS — C159 Malignant neoplasm of esophagus, unspecified: Secondary | ICD-10-CM | POA: Diagnosis not present

## 2017-12-10 DIAGNOSIS — K222 Esophageal obstruction: Secondary | ICD-10-CM

## 2017-12-10 DIAGNOSIS — C155 Malignant neoplasm of lower third of esophagus: Secondary | ICD-10-CM

## 2017-12-10 NOTE — Progress Notes (Signed)
[address]       [place] [address]             [phone]                  Darrell Cunningham [facility] Medical Record #[id number] Date of Birth: [birth year]  Referring XT:[id number], [name], MD Primary Oncology:Dr 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 PROCEDURES PERFORMED:  Bronchoscopy, transhiatal total esophagectomy with cervical esophagogastrostomy, pyloromyotomy, and feeding jejunostomy tube. SURGEON:  [name], MD.  Cancer Staging Malignant neoplasm of lower third of esophagus [facility]) Staging form: Esophagus - Adenocarcinoma, AJCC 8th Edition - Clinical stage from 10/26/2016: Stage III (cT3, cN1, cM0) - Signed by [name], MD on 11/05/2016 - Pathologic stage from 04/20/2017: Stage I (ypT0, pN0, cM0, GX) - Signed by [name], MD on 04/23/2017   History of Present Illness:      Patient returns to the office today for postop check after  Esophagectomy September 2018.  Post surgery the patient developed a anastomotic stricture, this has been dilated by Dr. [name] and  More recently the patient had an endoscopic procedure done with 5 quadrant biliary needle-knife excision of the anastomotic stricture.  This was done in February .Marland KitchenOn 11/04/2017 Dr. Lysle Rubens repeated upper endoscopy with needle knife incision of his anastomotic stricture followed by balloon dilation to 20 mm.    Patient notes improvement after this but still feels as those things stick when he swallows.  He has been careful about eating tough meats and overeating.  He is recently been started on Protonix and Carafate.  He notes that he is careful about keeping the head of his bed elevated at night and avoiding eating before laying down.    Zubrod  Score: At the time of surgery this patient's most appropriate activity status/level should be described as: [x]     0    Normal activity, no symptoms []     1    Restricted in physical strenuous activity but ambulatory, able to do out light work []     2    Ambulatory and capable of self care, unable to do work activities, up and about                 >50 % of waking hours                                                                                   []     3    Only limited self care, in bed greater than 50% of waking hours []     4    Completely disabled, no self care, confined to bed or chair []     5    Moribund  Social History   Tobacco Use  Smoking Status Former Smoker  . Packs/day: 1.00  . Years: 38.00  . Pack years: 38.00  . Types: Cigarettes  . Start date: 10/31/2012  . Last attempt to  quit: 10/31/2012  . Years since quitting: 5.1  Smokeless Tobacco Never Used       Allergies  Allergen Reactions  . Penicillins Hives, Itching and Swelling    Has patient had a PCN reaction causing immediate rash, facial/tongue/throat swelling, SOB or lightheadedness with hypotension: No Has patient had a PCN reaction causing severe rash involving mucus membranes or skin necrosis: No Has patient had a PCN reaction that required hospitalization No Has patient had a PCN reaction occurring within the last 10 years: No If all of the above answers are "NO", then may proceed with Cephalosporin use. Unknown Childhood reaction    Current Outpatient Medications  Medication Sig Dispense Refill  . acetaminophen (TYLENOL) 160 MG/5ML solution Take 10.2 mLs (325 mg total) by mouth every 4 (four) hours as needed for moderate pain. 120 mL 0  . albuterol (ACCUNEB) 0.63 MG/3ML nebulizer solution Take 1 ampule by nebulization every 6 (six) hours as needed for wheezing.    . cyclobenzaprine (FLEXERIL) 5 MG tablet TAKE 1 TABLET BY MOUTH THREE TIMES A DAY AS NEEDED FOR MUSCLE SPASMS 30 tablet 0  . Electrolyte-S  (ISOLYTE-S IV) Inject into the vein. Drinks orally use to use through feeding tube.    . fluticasone (FLONASE) 50 MCG/ACT nasal spray Place 1 spray into both nostrils daily as needed for allergies or rhinitis.    . fluticasone (FLOVENT DISKUS) 50 MCG/BLIST diskus inhaler Inhale into the lungs.    . lactose free nutrition (BOOST) LIQD Take 237 mLs 2 (two) times daily between meals by mouth.    . lidocaine-prilocaine (EMLA) cream Apply 1 application topically as needed. Apply to portacath  1 1/2 hours - 2 hours prior to procedures as needed. 30 g 1  . pantoprazole (PROTONIX) 40 MG tablet Take 1 tablet (40 mg total) by mouth daily. 30 tablet 3  . sucralfate (CARAFATE) 1 GM/10ML suspension Take 10 mLs (1 g total) by mouth 4 (four) times daily -  before meals and at bedtime. 420 mL 1   Current Facility-Administered Medications  Medication Dose Route Frequency Provider Last Rate Last Dose  . 0.9 %  sodium chloride infusion  500 mL Intravenous Once Pyrtle, [name], MD      . 0.9 %  sodium chloride infusion  500 mL Intravenous Continuous Pyrtle, [name], MD      . 0.9 %  sodium chloride infusion  500 mL Intravenous Once Pyrtle, [name], MD        Physical Exam: BP 115/73 (BP Location: Left Arm, Patient Position: Sitting, Cuff Size: Normal)   Pulse 67   Resp 16   Ht 5' (1.524 m)   Wt 121 lb 6.4 oz (55.1 kg)   SpO2 98% Comment: ON RA  BMI 23.71 kg/m   General appearance: alert and cooperative Head: Normocephalic, without obvious abnormality, atraumatic Neck: no adenopathy, no carotid bruit, no JVD, supple, symmetrical, trachea midline and thyroid not enlarged, symmetric, no tenderness/mass/nodules Lymph nodes: Cervical, supraclavicular, and axillary nodes normal. Resp: clear to auscultation bilaterally Cardio: regular rate and rhythm, S1, S2 normal, no murmur, click, rub or gallop GI: soft, non-tender; bowel sounds normal; no masses,  no organomegaly Extremities: extremities normal, atraumatic, no  cyanosis or edema and Homans sign is negative, no sign of DVT Neurologic: Grossly normal Patient's left neck incision abdominal incision is well-healed, some slight contraction of the scar on the neck  Diagnostic Studies & Laboratory data:         Recent Radiology Findings:  Recent Labs: Lab Results  Component Value Date   WBC 5.5 09/14/2017   HGB 12.1 (L) 09/14/2017   HCT 36.3 (L) 09/14/2017   PLT 204 09/14/2017   GLUCOSE 115 09/14/2017   CHOL 157 10/27/2014   TRIG 83 10/27/2014   HDL 34 (L) 10/27/2014   LDLCALC 106 (H) 10/27/2014   ALT 31 09/14/2017   AST 29 09/14/2017   NA 141 09/14/2017   K 3.9 09/14/2017   CL 108 09/14/2017   CREATININE 0.63 (L) 09/14/2017   BUN 16 09/14/2017   CO2 24 09/14/2017   INR 0.91 04/12/2017   HGBA1C 5.7 (H) 10/27/2014   Wt Readings from Last 3 Encounters:  12/10/17 121 lb 6.4 oz (55.1 kg)  11/08/17 116 lb 8 oz (52.8 kg)  10/25/17 118 lb (53.5 kg)      Assessment / Plan:   Postop esophageal stricture after transhiatal total esophagectomy done in September, is likely the patient will continue to need frequent dilations until the cicatrix of the scar settles down.  His weight appears stable at this point ranging from 118 to 121 pounds.  He is to contact GI about repeat dilatation in the near future. Plan to see him back in 3 months  I  spent 15 minutes with  the patient face to face and greater then 50% of the time was spent in counseling and coordination of care.    [name] 12/10/2017 11:56 AM

## 2017-12-13 ENCOUNTER — Encounter: Payer: Medicaid Other | Admitting: Cardiothoracic Surgery

## 2017-12-17 ENCOUNTER — Telehealth: Payer: Self-pay | Admitting: Family Medicine

## 2017-12-17 ENCOUNTER — Telehealth: Payer: Self-pay | Admitting: Internal Medicine

## 2017-12-17 MED ORDER — SUCRALFATE 1 GM/10ML PO SUSP: 1.0000 g | Freq: Three times a day (TID) | ORAL | 1 refills | Status: DC

## 2017-12-17 NOTE — Telephone Encounter (Signed)
error 

## 2017-12-17 NOTE — Telephone Encounter (Signed)
Rx sent 

## 2017-12-27 ENCOUNTER — Encounter: Payer: Self-pay | Admitting: Physician Assistant

## 2017-12-27 ENCOUNTER — Ambulatory Visit: Payer: Medicaid Other | Admitting: Physician Assistant

## 2017-12-27 VITALS — BP 96/60 | HR 78 | Ht 60.0 in | Wt 115.0 lb

## 2017-12-27 DIAGNOSIS — C159 Malignant neoplasm of esophagus, unspecified: Secondary | ICD-10-CM | POA: Diagnosis not present

## 2017-12-27 DIAGNOSIS — K9189 Other postprocedural complications and disorders of digestive system: Secondary | ICD-10-CM

## 2017-12-27 NOTE — Progress Notes (Addendum)
Chief Complaint: Dysphagia, History of esophageal adenocarcinoma status post chemotherapy, radiation and esophagectomy  HPI:    Darrell Cunningham is a 57 year old male with a past medical history as listed below, including esophageal adenocarcinoma status post chemotherapy, radiation and esophagectomy with gastric pull through complicated by esophagogastric anastomotic stricture, falls with Dr. [name] and returns to clinic today with a complaint of dysphagia.    Briefly 10/25/17 patient had repeat esophageal dilation for quickly recurrent severe anastomotic stricture despite dilation he was referred back to see Dr. Lysle Rubens at [facility]. 11/04/17 Dr. Okey Dupre repeated upper endoscopy with needle knife incision of his anastomotic stricture followed by balloon dilation to 20 mm. It also previously done this on 08/31/2017 had recurrence of the stricture. At time of that visit patient reported improvement in swallowing but still had been very scared to eat foods that were not very very soft. Continued with some pressure in his chest. Some burning discomfort as well. At that visit started back on Pantoprazole 40 mg daily. Also resumed Carafate suspension 1 g 3 times a day. Chest x-ray was checked to exclude complication and was unremarkable. He was told to use high calorie protein shakes.      12/10/17 OV Dr. [name] with cardiac and thoracic surgery continued to describe dysphagia. It was discussed that patient had a postoperative esophageal stricture after transhiatal total esophagectomy done in September and it was likely the patient would continue to need frequent dilations until the cicatrix of the scar settles down.    Today, explains that for the past 2 weeks or so he has started to notice that it is hard for food to go down again. Patient tries to avoid foods that he knows are going to be a problem. Patient is somewhat frustrated and depressed with his repeat symptoms all the time. He has continued his Carafate and  Pantoprazole. Patient continues his shakes as well to maintain weight but has lost about 6 pounds since being seen on the 13th by Dr. Jobie Quaker.    Denies fever, chills, blood in the stool, melena or symptoms that awaken him at night.  Past Medical History:  Diagnosis Date  . Allergy   . Arthritis   . Asthma    as a child  . Cancer (HCC)    ESOPHAGUS CARCINOMA   . COPD (chronic obstructive pulmonary disease) ([place])   . CP (cerebral palsy), spastic ([place])    right  . Dysphagia   . Emphysema of lung ([place])   . Encounter for nonprocreative genetic counseling 10/31/2016   Mr. Linebaugh underwent genetic counseling for hereditary cancer syndromes on 10/31/2016. Though he is a candidate for genetic testing, he declines at this time.  Marland Kitchen GERD (gastroesophageal reflux disease)   . Hypertension   . Neuromuscular disorder ([place])   . Pneumonia 4 yrs ago    Past Surgical History:  Procedure Laterality Date  . COMPLETE ESOPHAGECTOMY N/A 04/16/2017   Procedure: ESOPHAGECTOMY COMPLETE,Transhiatal total esophagectomy;  Surgeon: [name], MD;  [facility] OR;  Service: Thoracic;  Laterality: N/A;  . EUS N/A 10/26/2016   Procedure: UPPER ENDOSCOPIC ULTRASOUND (EUS) LINEAR;  Surgeon: [name], MD;  Location: [facility] ENDOSCOPY;  Service: Endoscopy;  Laterality: N/A;  . EYE SURGERY Bilateral age 58    for cross eyes  . IR FLUORO GUIDE PORT INSERTION RIGHT  11/08/2016  . IR US GUIDE VASC ACCESS RIGHT  11/08/2016  . JEJUNOSTOMY N/A 04/16/2017   Procedure: Donney Rankins;  Surgeon: [name], MD;  Location: [facility] OR;  Service: Thoracic;  Laterality: N/A;  . MOUTH SURGERY    . UPPER GASTROINTESTINAL ENDOSCOPY    . VIDEO BRONCHOSCOPY N/A 04/16/2017   Procedure: VIDEO BRONCHOSCOPY, Transhiatal Total Esophagectomy, Esophagogastrostomy, pyloromotomy, Feeding Jejunostomy;  Surgeon: [name], MD;  Location: [facility] OR;  Service: Thoracic;  Laterality: N/A;    Current Outpatient  Medications  Medication Sig Dispense Refill  . acetaminophen (TYLENOL) 160 MG/5ML solution Take 10.2 mLs (325 mg total) by mouth every 4 (four) hours as needed for moderate pain. 120 mL 0  . albuterol (ACCUNEB) 0.63 MG/3ML nebulizer solution Take 1 ampule by nebulization every 6 (six) hours as needed for wheezing.    . cyclobenzaprine (FLEXERIL) 5 MG tablet TAKE 1 TABLET BY MOUTH THREE TIMES A DAY AS NEEDED FOR MUSCLE SPASMS 30 tablet 0  . Electrolyte-S (ISOLYTE-S IV) Inject into the vein. Drinks orally use to use through feeding tube.    . fluticasone (FLONASE) 50 MCG/ACT nasal spray Place 1 spray into both nostrils daily as needed for allergies or rhinitis.    . fluticasone (FLOVENT DISKUS) 50 MCG/BLIST diskus inhaler Inhale into the lungs.    . lactose free nutrition (BOOST) LIQD Take 237 mLs 2 (two) times daily between meals by mouth.    . lidocaine-prilocaine (EMLA) cream Apply 1 application topically as needed. Apply to portacath  1 1/2 hours - 2 hours prior to procedures as needed. 30 g 1  . pantoprazole (PROTONIX) 40 MG tablet Take 1 tablet (40 mg total) by mouth daily. 30 tablet 3  . sucralfate (CARAFATE) 1 GM/10ML suspension Take 10 mLs (1 g total) by mouth 4 (four) times daily -  before meals and at bedtime. 1200 mL 1   Current Facility-Administered Medications  Medication Dose Route Frequency Provider Last Rate Last Dose  . 0.9 %  sodium chloride infusion  500 mL Intravenous Once Pyrtle, [name], MD      . 0.9 %  sodium chloride infusion  500 mL Intravenous Continuous Pyrtle, [name], MD      . 0.9 %  sodium chloride infusion  500 mL Intravenous Once Pyrtle, [name], MD        Allergies as of 12/27/2017 - Review Complete 12/27/2017  Allergen Reaction Noted  . Penicillins Hives, Itching, and Swelling 11/01/2012    Family History  Problem Relation Age of Onset  . Colon cancer Maternal Grandmother 64  . Breast cancer Maternal Grandmother 58  . Breast cancer Sister 24       Deceased  at 62 of breast cancer  . Ovarian cancer Maternal Aunt   . Stomach cancer Neg Hx   . Esophageal cancer Neg Hx   . Colon polyps Neg Hx   . Rectal cancer Neg Hx     Social History   Socioeconomic History  . Marital status: Legally Separated    Spouse name: Not on file  . Number of children: Not on file  . Years of education: Not on file  . Highest education level: Not on file  Occupational History  . Not on file  Social Needs  . Financial resource strain: Not on file  . Food insecurity:    Worry: Not on file    Inability: Not on file  . Transportation needs:    Medical: Not on file    Non-medical: Not on file  Tobacco Use  . Smoking status: Former Smoker    Packs/day: 1.00    Years: 38.00  Pack years: 38.00    Types: Cigarettes    Start date: 10/31/2012    Last attempt to quit: 10/31/2012    Years since quitting: 5.1  . Smokeless tobacco: Never Used  Substance and Sexual Activity  . Alcohol use: No    Alcohol/week: 0.0 oz  . Drug use: No  . Sexual activity: Not on file  Lifestyle  . Physical activity:    Days per week: Not on file    Minutes per session: Not on file  . Stress: Not on file  Relationships  . Social connections:    Talks on phone: Not on file    Gets together: Not on file    Attends religious service: Not on file    Active member of club or organization: Not on file    Attends meetings of clubs or organizations: Not on file    Relationship status: Not on file  . Intimate partner violence:    Fear of current or ex partner: Not on file    Emotionally abused: Not on file    Physically abused: Not on file    Forced sexual activity: Not on file  Other Topics Concern  . Not on file  Social History Narrative  . Not on file    Review of Systems:    Constitutional: No weight loss, fever or chills Cardiovascular: No chest pain Respiratory: No SOB  Gastrointestinal: See HPI and otherwise negative   Physical Exam:  Vital signs: BP 96/60   Pulse  78   Ht 5' (1.524 m)   Wt 115 lb (52.2 kg)   BMI 22.46 kg/m    Constitutional:   Pleasant Caucasian male appears to be in NAD, Well developed, Well nourished, alert and cooperative Respiratory: Respirations even and unlabored. Lungs clear to auscultation bilaterally.   No wheezes, crackles, or rhonchi.  Cardiovascular: Normal S1, S2. No MRG. Regular rate and rhythm. No peripheral edema, cyanosis or pallor.  Gastrointestinal:  Soft, nondistended, mild ttp over midline surgical scar over epigastrum. No rebound or guarding. Normal bowel sounds. No appreciable masses or hepatomegaly. Psychiatric: Demonstrates good judgement and reason without abnormal affect or behaviors.  No recent labs or imaging.  Assessment: 1. Recurrent dysphagia: History of esophageal cancer/history of esophagectomy/gastroesophageal anastomotic ulceration, previous needle knife incision of his anastomotic stricture and balloon dilation 20 mm, Last 11/04/17 by Dr. Lysle Rubens at [facility], now with recurrent dysphagia  Plan: 1. Discussed with Dr. [name]. He is willing to try another dilation. It has been almost 2 months since patient's last procedure which was performed by Dr. Lysle Rubens at [facility]. Scheduled patient for EGD with dilation next week in [place]. Discussed risks, benefits, limitations and alternatives and the patient agrees to proceed. 2. Continue Pantoprazole and Carafate as prescribed 3. Patient to follow in clinic per recommendations from Dr. [name] after time of procedure.  Ellouise Newer, PA-C [facility] 12/27/2017, 9:39 AM   Addendum: Reviewed and agree with management.  Difficult post esophagectomy stricture status post needle knife and balloon dilation at [facility] x2 Will attempt repeat dilation locally but back to Dr. Lysle Rubens if unsuccessful Pyrtle, [name], MD    Cc: [name], MD

## 2017-12-27 NOTE — Patient Instructions (Signed)
If you are age 59 or younger, your body mass index should be between 19-25. Your Body mass index is 22.46 kg/m. If this is out of the aformentioned range listed, please consider follow up with your Primary Care Provider.   Continue Pantoprazole sodium 40 mg and the Carafate.  You have been scheduled for an endoscopy. Please follow written instructions given to you at your visit today. If you use inhalers (even only as needed), please bring them with you on the day of your procedure. Your physician has requested that you go to www.startemmi.com and enter the access code given to you at your visit today. This web site gives a general overview about your procedure. However, you should still follow specific instructions given to you by our office regarding your preparation for the procedure.

## 2018-01-01 ENCOUNTER — Other Ambulatory Visit: Payer: Self-pay

## 2018-01-01 ENCOUNTER — Encounter: Payer: Self-pay | Admitting: Internal Medicine

## 2018-01-01 ENCOUNTER — Ambulatory Visit (AMBULATORY_SURGERY_CENTER): Payer: Medicaid Other | Admitting: Internal Medicine

## 2018-01-01 VITALS — BP 96/60 | HR 73 | Temp 99.1°F | Resp 18 | Ht 60.0 in | Wt 115.0 lb

## 2018-01-01 DIAGNOSIS — K9189 Other postprocedural complications and disorders of digestive system: Secondary | ICD-10-CM

## 2018-01-01 MED ORDER — SODIUM CHLORIDE 0.9 % IV SOLN: 500.0000 mL | Freq: Once | INTRAVENOUS | Status: DC

## 2018-01-01 NOTE — Progress Notes (Signed)
Report to PACU, RN, vss, BBS= Clear.  

## 2018-01-01 NOTE — Op Note (Signed)
[place] Patient Name: Darrell Cunningham Procedure Date: 01/01/2018 10:40 AM MRN: [id number] Endoscopist: [name] , MD Age: 57 Referring MD:  Date of Birth: [birth year] Gender: Male Account #: [id number] Procedure:                Upper GI endoscopy Indications:              Dysphagia, For therapy of post-surgical anastomotic                            stenosis after esophagogastrectomy; electroincision                            and balloon dilation at [facility] (08/31/17 and 11/05/17);                            multiple prior EGD with balloon dilation locally;                            last EGD 11/05/17 Medicines:                Monitored Anesthesia Care Procedure:                Pre-Anesthesia Assessment:                           - Prior to the procedure, a History and Physical                            was performed, and patient medications and                            allergies were reviewed. The patient's tolerance of                            previous anesthesia was also reviewed. The risks                            and benefits of the procedure and the sedation                            options and risks were discussed with the patient.                            All questions were answered, and informed consent                            was obtained. Prior Anticoagulants: The patient has                            taken no previous anticoagulant or antiplatelet                            agents. ASA Grade Assessment: III - A patient with  severe systemic disease. After reviewing the risks                            and benefits, the patient was deemed in                            satisfactory condition to undergo the procedure.                           After obtaining informed consent, the endoscope was                            passed under direct vision. Throughout the                            procedure, the patient's blood  pressure, pulse, and                            oxygen saturations were monitored continuously. The                            Endoscope was introduced through the mouth, and                            advanced to the second part of duodenum. The upper                            GI endoscopy was accomplished without difficulty.                            The patient tolerated the procedure well. Scope In: Scope Out: Findings:                 An esophagogastric anastomosis was found at 20 cm                            from the incisors. This was characterized by                            stenosis 0.8 cm (inner diameter) x 0.5 cm (length).                            A TTS dilator was passed through the scope.                            Dilation with a 13.5-14.5-15.5 mm balloon dilator                            was performed to 14.5 mm. The dilation site was                            examined and showed moderate improvement in luminal  narrowing.                           The entire examined stomach was normal.                           The examined duodenum was normal. Complications:            No immediate complications. Estimated Blood Loss:     Estimated blood loss was minimal. Impression:               - An esophagogastric anastomosis was found,                            characterized by stenosis. Dilated.                           - Normal stomach.                           - Normal examined duodenum.                           - No specimens collected. Recommendation:           - Patient has a contact number available for                            emergencies. The signs and symptoms of potential                            delayed complications were discussed with the                            patient. Return to normal activities tomorrow.                            Written discharge instructions were provided to the                             patient.                           - Resume previous diet.                           - Continue present medications.                           - Repeat upper endoscopy in 2-3 weeks for                            retreatment. [name], MD 01/01/2018 11:13:52 AM This report has been signed electronically.

## 2018-01-01 NOTE — Progress Notes (Signed)
Pt. Reports no change in his medical or surgical history since his pre-visit 12/27/2017.

## 2018-01-01 NOTE — Patient Instructions (Signed)
YOU HAD AN ENDOSCOPIC PROCEDURE TODAY AT [facility]:   Refer to the procedure report that was given to you for any specific questions about what was found during the examination.  If the procedure report does not answer your questions, please call your gastroenterologist to clarify.  If you requested that your care partner not be given the details of your procedure findings, then the procedure report has been included in a sealed envelope for you to review at your convenience later.  YOU SHOULD EXPECT: Some feelings of bloating in the abdomen. Passage of more gas than usual.  Walking can help get rid of the air that was put into your GI tract during the procedure and reduce the bloating. If you had a lower endoscopy (such as a colonoscopy or flexible sigmoidoscopy) you may notice spotting of blood in your stool or on the toilet paper. If you underwent a bowel prep for your procedure, you may not have a normal bowel movement for a few days.  Please Note:  You might notice some irritation and congestion in your nose or some drainage.  This is from the oxygen used during your procedure.  There is no need for concern and it should clear up in a day or so.  SYMPTOMS TO REPORT IMMEDIATELY:  Following upper endoscopy (EGD)  Vomiting of blood or coffee ground material  New chest pain or pain under the shoulder blades  Painful or persistently difficult swallowing  New shortness of breath  Fever of 100F or higher  Black, tarry-looking stools  For urgent or emergent issues, a gastroenterologist can be reached at any hour by calling [phone].   DIET:  We do recommend a small meal at first, but then you may proceed to your regular diet.  Drink plenty of fluids but you should avoid alcoholic beverages for 24 hours.  ACTIVITY:  You should plan to take it easy for the rest of today and you should NOT DRIVE or use heavy machinery until tomorrow (because of the sedation medicines used  during the test).    FOLLOW UP: Our staff will call the number listed on your records the next business day following your procedure to check on you and address any questions or concerns that you may have regarding the information given to you following your procedure. If we do not reach you, we will leave a message.  However, if you are feeling well and you are not experiencing any problems, there is no need to return our call.  We will assume that you have returned to your regular daily activities without incident.  If any biopsies were taken you will be contacted by phone or by letter within the next 1-3 weeks.  Please call us at [phone] if you have not heard about the biopsies in 3 weeks.    SIGNATURES/CONFIDENTIALITY: You and/or your care partner have signed paperwork which will be entered into your electronic medical record.  These signatures attest to the fact that that the information above on your After Visit Summary has been reviewed and is understood.  Full responsibility of the confidentiality of this discharge information lies with you and/or your care-partner.  After dilation diet given.  Repeat endoscopy for dilation scheduled.

## 2018-01-01 NOTE — Progress Notes (Signed)
Called to room to assist during endoscopic procedure.  Patient ID and intended procedure confirmed with present staff. Received instructions for my participation in the procedure from the performing physician.  

## 2018-01-02 ENCOUNTER — Telehealth: Payer: Self-pay

## 2018-01-02 NOTE — Telephone Encounter (Signed)
  Follow up Call-  Call Mandela Bello number 01/01/2018 10/25/2017 10/03/2017 08/13/2017 07/16/2017 10/09/2016  Post procedure Call Kayden Hutmacher phone  # [phone]  Permission to leave phone message - Yes Yes Yes Yes Yes  Some recent data might be hidden     Patient questions:  Do you have a fever, pain , or abdominal swelling? No. Pain Score  0 *  Have you tolerated food without any problems? Yes.    Have you been able to return to your normal activities? Yes.    Do you have any questions about your discharge instructions: Diet   No. Medications  No. Follow up visit  No.  Do you have questions or concerns about your Care? No.  Actions: * If pain score is 4 or above: No action needed, pain <4.

## 2018-01-02 NOTE — Telephone Encounter (Signed)
No answer

## 2018-01-10 NOTE — Progress Notes (Signed)
[place]  [phone] [phone]  Clinic Follow-up Note   Patient Care Team: [name], MD as PCP - General (Family Medicine)   Date of Service:  01/11/2018   CHIEF COMPLAINTS:  Follow up esophageal adenocarcinoma   Oncology History   Cancer Staging Esophageal cancer [facility]) Staging form: Esophagus - Adenocarcinoma, AJCC 8th Edition - Clinical stage from 10/26/2016: Stage III (cT3, cN1, cM0) - Signed by [name], MD on 11/05/2016       Malignant neoplasm of lower third of esophagus ([place])   09/21/2016 - 09/21/2016 Hospital Admission    esophageal pain and vomiting up blood      10/09/2016 Initial Diagnosis    Esophageal cancer ([place])      10/09/2016 Procedure    EGD 1. Partially obstructing, likely malignant esophageal tumor was found in the lower third of the esophagus. Multiple biopsies.  2. Mass visible during gastric retroflexion at GE junction 3. Otherwise normal stomach 4. Normal examined duodenum       10/09/2016 Pathology Results    Esophagus, biopsy, distal esophageal tumor (33-39) - SUSPICIOUS FOR ADENOCARCINOMA      10/12/2016 Imaging    CT CAP w Contrast IMPRESSION: Distal esophageal mass compatible with primary esophageal malignancy. There are 2 adjacent abnormal appearing subcentimeter paraesophageal lymph nodes which may represent nodal metastasis. Additionally there is a prominent nonspecific 8 mm upper abdominal lymph node.  No evidence for distant metastatic disease in the chest, abdomen or pelvis.      10/24/2016 PET scan    1. Markedly hypermetabolic distal esophageal lesion, compatible with malignancy. Adjacent small paraesophageal lymph nodes are abnormal by CT but cannot be resolved as separate structures from the hypermetabolic esophageal activity on the PET images. No hypermetabolism is demonstrated in the upper abdominal/gastrohepatic ligament lymph node although the small size of this lymph  node may be below threshold for detection on PET imaging. 2. No evidence for distant hypermetabolic metastatic disease in the neck, chest, abdomen, or pelvis.      10/26/2016 Pathology Results    Esophagogastric junction, biopsy, mass - INVASIVE ADENOCARCINOMA.      10/26/2016 Procedure    EUS showed uT3N1 disease, and biopsy confirmed adenocarcinoma       10/30/2016 - 12/07/2016 Radiation Therapy    Neoadjuvant radiation to esophageal cancer  Under the care of Dr. [name]      10/31/2016 - 12/07/2016 Neo-Adjuvant Chemotherapy    Weekly Carboplatin AUC 2 and taxol 50mg /m2 with concurrent radiation        12/14/2016 Imaging    CT AP W Contrast 12/14/16 IMPRESSION: 1. Mildly dilated short segment of proximal jejunum without bowel wall thickening or significant inflammatory changes. Findings may represent focal ileus or radiation enteritis. No evidence for obstruction. 2. Significantly improved appearance of the distal esophagus/proximal stomach with decreased wall thickening (1.1 cm from 2.3 cm), and smaller paraesophageal and gastrohepatic ligament lymph nodes. 3. No bowel obstruction.  Normal appendix.      01/18/2017 Imaging    CT CAP W Contrast 01/18/17 IMPRESSION: 1. Mild stable distal esophageal wall thickening likely due to radiation change. No findings for recurrent tumor. Small paraesophageal lymph nodes are also stable. 2. No findings for metastatic disease. 3. Stable mild/early emphysematous changes and age advanced atherosclerotic calcifications involving the thoracic and abdominal aorta and branch vessels.      04/12/2017 Imaging    CT Chest and Abdomen W Contrast 04/12/17  IMPRESSION: 1. Mild stable distal esophageal wall  thickening. No findings for recurrent tumor. 2. Stable small mediastinal and left supraclavicular lymph nodes. 3. There is a tiny nodule within the medial right lower lobe measuring 3 mm. New from previous exam. Nonspecific. Attention  on follow-up imaging advise. 4.  Aortic Atherosclerosis (ICD10-I70.0). 5. Three vessel coronary artery calcification      04/16/2017 Surgery    VIDEO BRONCHOSCOPY, Transhiatal Total Esophagectomy, Esophagogastrostomy, pyloromotomy, Feeding Jejunostomy ESOPHAGECTOMY COMPLETE,Transhiatal total esophagectomy JEJUNOSTOMY,Feeding by Dr. [name] 04/16/17      04/16/2017 Pathology Results    Diagnosis 04/16/17  1. Omentum, resection for tumor - BENIGN ADIPOSE TISSUE CONSISTENT WITH OMENTUM. - NO EVIDENCE OF MALIGNANCY. 2. Esophagus, resection, w/ GE junction - FIBROSIS WITH PATCHY CHRONIC INFLAMMATION. - NO RESIDUAL CARCINOMA IDENTIFIED. - MARGINS NOT INVOLVED. - TWELVE LYMPH NODES WITH NO METASTATIC CARCINOMA (0/12).       07/16/2017 Procedure    Upper GI Endoscopy Findings: per Dr. [name] - Food was found in the upper third of the esophagus. Removal of food was accomplished with Jabier Mutton net. - A partial esophagectomy anastomosis was found in the upper third of the esophagus (20 cm from incisors). This was characterized by severe stenosis, an intact staple line and visible sutures. The standard adult upper endoscope would not pass before dilation. A TTS dilator was passed through the scope. Dilation with an 8.5-9.5-10.5 mm and then 06-11-12 mm balloon dilator was performed to 12 mm (inspection after 9.5 mm, 10.5 mm, 11 mm and 12 mm). The dilation site was examined and showed moderate and significant improvement in luminal narrowing. Estimated blood loss was minimal. After dilation to 12 mm the endoscope was able to transverse the anastomosis with minimal pressure. - A medium amount of food (residue) was found in the gastric body. - The exam of the stomach was otherwise normal. - The examined duodenum was normal.      08/13/2017 Procedure    Upper GI Endoscopy Findings: per Dr. [name] - Food was found in the proximal esophagus. Removal of food was accomplished with Jabier Mutton net. - One severe  (stenosis; an endoscope cannot pass) benign-appearing, intrinsic stenosis was found 20 cm from the incisors. This measured 1 cm (in length) and was traversed after dilation. A TTS dilator was passed through the scope. Dilation with an 06-11-12 mm balloon dilator was performed to 13 mm (after 98mm and 12 mm). The dilation site was examined and showed moderate improvement in luminal narrowing. At the stricture there is a visible staple on the proximal side and visible suture material on the gastric side. - A medium amount of food (residue) -the duodenum was normal       08/31/2017 Procedure    Endoscopic needle-knife excision of anastomotic stricture by Dr. Lysle Rubens at [facility]      09/11/2017 Imaging    CT CAP W CONTRAST IMPRESSION: 1. Interval esophagectomy with gastric pull-through. No demonstrated complication. There is retained ingested material within the intrathoracic portion of the stomach. 2. No evidence of local recurrence or metastatic disease. 3. Stable small left supraclavicular and superior mediastinal lymph nodes. 4. Mild lower lobe paramediastinal pulmonary opacity bilaterally, likely atelectasis or sequela of prior radiation therapy. 5.  Aortic Atherosclerosis (ICD10-I70.0).       HISTORY OF PRESENTING ILLNESS (10/16/2016):  [name] 57 y.o. male is here because of an esophageal mass that is suspicious for adenocarcinoma. The pt presented to the ED on 09/21/2016 for esophageal pain and vomiting up blood. He had been struggling with regurgitating food or drinks,  difficulty swallowing, and throat pain for 3 weeks prior. He was referred by his PCP to GI who ordered him an EGD with dilation with Dr. [name] as soon as possible. The EGD and biopsy was performed on 10/09/2016, which showed suspicion of adenocarcinoma of the esophagus and a CT scan was ordered. The CT results demonstrated a distal esophageal mass compatible with primary esophageal malignancy. He is here to discuss  treatment.   When he has trouble swallowing, he has to stand up and push his head back to get the food down. He has mostly eating oatmeal, Ensure, and other soft foods. He drinks 2-3 Ensure a day. He has cut out coffee and acidic foods to try to relieve his pain. It is not getting better, though. He isn't able to eat much due to this pain and difficulty swallowing. He does not take any pain medication. He has lost about 13 pounds. He also has some left sided abdominal pain. At night, he has a lot of acid reflux and sputum production. Denies constipation, diarrhea, nausea, SOB, or any other concerns.   He has a history of cerebral palsy on his right side. He has some back pain due to a curvature in his spine. History of asthma. His grandmother had breast cancer and colon cancer. His sister passed of breast cancer at age 77. His aunt had ovarian cancer. No other family history of cancer. He quit smoking 4 years ago. 38 pack years, 1-1.5 ppd. He lives at home with his 50 y.o. Daughter. His parents live about 15 minutes away.   CURRENT THERAPY: observation   INTERVAL HISTORY:    The patient returns for follow up for his esophogeal cancer. He was also seen by me in 05/2017. In interim he was seen by NP Lacie in 08/2017 to disucssed his surveillance CT scan.  He presents to the clinic today accompanied by his family member. He notes he is recovering well from treatment. He is adjusting to his new normal. He notes he recently went for an esophogeal stretch to help him eat. He is eating whatever he can swallow and keep down. He has harder time eating meats and breads. He takes Sulcrafate.     On review of symptoms, pt notes eating as best he can and getting regular esophogeal stretches. His weight has been stable. He notes occasional pain in his throat. He notes coughing with phlegm from a chest congestion. His phlegm is clear but will notify physicians if that changes. He is able to be active at home and do  light work. He notes occasional pain and knot from surgical incision.      MEDICAL HISTORY:  Past Medical History:  Diagnosis Date  . Allergy   . Arthritis   . Asthma    as a child  . Cancer (HCC)    ESOPHAGUS CARCINOMA   . COPD (chronic obstructive pulmonary disease) ([place])   . 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 Dysphagia   . Emphysema of lung ([place])   . Encounter for nonprocreative genetic counseling 10/31/2016   Mr. Ice underwent genetic counseling for hereditary cancer syndromes on 10/31/2016. Though he is a candidate for genetic testing, he declines at this time.  Marland Kitchen GERD (gastroesophageal reflux disease)   . Hypertension   . Neuromuscular disorder ([place])   . Pneumonia 4 yrs ago    SURGICAL HISTORY: Past Surgical History:  Procedure Laterality Date  . COMPLETE ESOPHAGECTOMY N/A 04/16/2017   Procedure:  ESOPHAGECTOMY COMPLETE,Transhiatal total esophagectomy;  Surgeon: [name], MD;  Location: [facility] OR;  Service: Thoracic;  Laterality: N/A;  . EUS N/A 10/26/2016   Procedure: UPPER ENDOSCOPIC ULTRASOUND (EUS) LINEAR;  Surgeon: [name], MD;  Location: [facility] ENDOSCOPY;  Service: Endoscopy;  Laterality: N/A;  . EYE SURGERY Bilateral age 32    for cross eyes  . IR FLUORO GUIDE PORT INSERTION RIGHT  11/08/2016  . IR US GUIDE VASC ACCESS RIGHT  11/08/2016  . JEJUNOSTOMY N/A 04/16/2017   Procedure: Donney Rankins;  Surgeon: [name], MD;  Location: [place];  Service: Thoracic;  Laterality: N/A;  . MOUTH SURGERY    . UPPER GASTROINTESTINAL ENDOSCOPY    . VIDEO BRONCHOSCOPY N/A 04/16/2017   Procedure: VIDEO BRONCHOSCOPY, Transhiatal Total Esophagectomy, Esophagogastrostomy, pyloromotomy, Feeding Jejunostomy;  Surgeon: [name], MD;  Location: [facility] OR;  Service: Thoracic;  Laterality: N/A;    SOCIAL HISTORY: Social History   Socioeconomic History  . Marital status: Legally Separated    Spouse name: Not on file  . Number of children:  Not on file  . Years of education: Not on file  . Highest education level: Not on file  Occupational History  . Not on file  Social Needs  . Financial resource strain: Not on file  . Food insecurity:    Worry: Not on file    Inability: Not on file  . Transportation needs:    Medical: Not on file    Non-medical: Not on file  Tobacco Use  . Smoking status: Former Smoker    Packs/day: 1.00    Years: 38.00    Pack years: 38.00    Types: Cigarettes    Start date: 10/31/2012    Last attempt to quit: 10/31/2012    Years since quitting: 5.2  . Smokeless tobacco: Never Used  Substance and Sexual Activity  . Alcohol use: No    Alcohol/week: 0.0 oz  . Drug use: No  . Sexual activity: Not on file  Lifestyle  . Physical activity:    Days per week: Not on file    Minutes per session: Not on file  . Stress: Not on file  Relationships  . Social connections:    Talks on phone: Not on file    Gets together: Not on file    Attends religious service: Not on file    Active member of club or organization: Not on file    Attends meetings of clubs or organizations: Not on file    Relationship status: Not on file  . Intimate partner violence:    Fear of current or ex partner: Not on file    Emotionally abused: Not on file    Physically abused: Not on file    Forced sexual activity: Not on file  Other Topics Concern  . Not on file  Social History Narrative  . Not on file    FAMILY HISTORY: Family History  Problem Relation Age of Onset  . Colon cancer Maternal Grandmother 18  . Breast cancer Maternal Grandmother 17  . Breast cancer Sister 21       Deceased at 55 of breast cancer  . Ovarian cancer Maternal Aunt   . Stomach cancer Neg Hx   . Esophageal cancer Neg Hx   . Colon polyps Neg Hx   . Rectal cancer Neg Hx     ALLERGIES:  is allergic to penicillins.  MEDICATIONS:  Current Outpatient Medications  Medication Sig Dispense Refill  .  acetaminophen (TYLENOL) 160 MG/5ML  solution Take 10.2 mLs (325 mg total) by mouth every 4 (four) hours as needed for moderate pain. 120 mL 0  . albuterol (ACCUNEB) 0.63 MG/3ML nebulizer solution Take 1 ampule by nebulization every 6 (six) hours as needed for wheezing.    . cyclobenzaprine (FLEXERIL) 5 MG tablet TAKE 1 TABLET BY MOUTH THREE TIMES A DAY AS NEEDED FOR MUSCLE SPASMS 30 tablet 0  . Electrolyte-S (ISOLYTE-S IV) Inject into the vein. Drinks orally use to use through feeding tube.    . fluticasone (FLONASE) 50 MCG/ACT nasal spray Place 1 spray into both nostrils daily as needed for allergies or rhinitis.    . fluticasone (FLOVENT DISKUS) 50 MCG/BLIST diskus inhaler Inhale into the lungs.    . lactose free nutrition (BOOST) LIQD Take 237 mLs 2 (two) times daily between meals by mouth.    . lidocaine-prilocaine (EMLA) cream Apply 1 application topically as needed. Apply to portacath  1 1/2 hours - 2 hours prior to procedures as needed. 30 g 1  . pantoprazole (PROTONIX) 40 MG tablet Take 1 tablet (40 mg total) by mouth daily. 30 tablet 3  . sucralfate (CARAFATE) 1 GM/10ML suspension Take 10 mLs (1 g total) by mouth 4 (four) times daily -  before meals and at bedtime. 1200 mL 1   Current Facility-Administered Medications  Medication Dose Route Frequency Provider Last Rate Last Dose  . 0.9 %  sodium chloride infusion  500 mL Intravenous Once Pyrtle, [name], MD      . 0.9 %  sodium chloride infusion  500 mL Intravenous Continuous Pyrtle, [name], MD      . 0.9 %  sodium chloride infusion  500 mL Intravenous Once Pyrtle, [name], MD      . 0.9 %  sodium chloride infusion  500 mL Intravenous Once Pyrtle, [name], MD       REVIEW OF SYSTEMS:  Constitutional: Denies fevers, chills or abnormal night sweats (+) stable weight  Eyes: Denies blurriness of vision, double vision or watery eyes Ears, nose, mouth, throat, and face: Denies mucositis or sore throat  (+) improvement in swallowing solids Respiratory: Denies dyspnea or wheezes (+)  cough with phlegm, chest congestion  Cardiovascular: Denies palpitation, chest discomfort or lower extremity swelling Gastrointestinal:  Denies nausea, heartburn or change in bowel habits  (+) occasional surgical scar pain Skin: Denies abnormal skin rashes  Lymphatics: Denies new lymphadenopathy or easy bruising Neurological:Denies numbness, tingling or new weaknesses  MSK: (+) muscle spasms in extremities Musculoskeletal: Negative Behavioral/Psych: Mood is stable, no new changes  All other systems were reviewed with the patient and are negative.  PHYSICAL EXAMINATION: ECOG PERFORMANCE STATUS: 1 - Symptomatic but completely ambulatory  Vitals:   01/11/18 1021  BP: 134/80  Pulse: 65  Resp: 17  Temp: 98.7 F (37.1 C)  SpO2: 100%   Filed Weights   01/11/18 1021  Weight: 121 lb 12.8 oz (55.2 kg)    GENERAL:alert, no distress and comfortable (+) limp from right sided cerebral palsy SKIN: skin color, texture, turgor are normal, no rashes or significant lesions EYES: normal, conjunctiva are pink and non-injected, sclera clear OROPHARYNX:no exudate, no erythema and lips, buccal mucosa, and tongue normal  NECK: supple, thyroid normal size, non-tender, without nodularity LYMPH:  no palpable lymphadenopathy in the cervical, axillary or inguinal LUNGS: clear to auscultation and percussion with normal breathing effort  HEART: regular rate & rhythm and no murmurs and no lower extremity edema ABDOMEN: (+)  Midline surgical incision healed very well, mild scar tissue Musculoskeletal:no cyanosis of digits and no clubbing  PSYCH: alert & oriented x 3 with fluent speech NEURO: no focal motor/sensory deficits  LABORATORY DATA:  I have reviewed the data as listed CBC Latest Ref Rng & Units 01/11/2018 09/14/2017 06/14/2017  WBC 4.0 - 10.3 K/uL 3.5(L) 5.5 5.6  Hemoglobin 13.0 - 17.1 g/dL 12.5(L) 12.1(L) 12.1(L)  Hematocrit 38.4 - 49.9 % 37.3(L) 36.3(L) 37.4(L)  Platelets 140 - 400 K/uL 150 204  191   CMP Latest Ref Rng & Units 01/11/2018 09/14/2017 06/14/2017  Glucose 70 - 140 mg/dL 103 115 101  BUN 7 - 26 mg/dL 15 16 10.2  Creatinine 0.70 - 1.30 mg/dL 0.65(L) 0.63(L) 0.7  Sodium 136 - 145 mmol/L 140 141 137  Potassium 3.5 - 5.1 mmol/L 3.9 3.9 3.9  Chloride 98 - 109 mmol/L 107 108 -  CO2 22 - 29 mmol/L 25 24 22   Calcium 8.4 - 10.4 mg/dL 9.2 9.4 9.5  Total Protein 6.4 - 8.3 g/dL 6.5 6.5 7.4  Total Bilirubin 0.2 - 1.2 mg/dL 0.4 0.2 0.38  Alkaline Phos 40 - 150 U/L 144 136 132  AST 5 - 34 U/L 30 29 22   ALT 0 - 55 U/L 57(H) 31 26   PATHOLOGY:  Diagnosis 04/16/17  1. Omentum, resection for tumor - BENIGN ADIPOSE TISSUE CONSISTENT WITH OMENTUM. - NO EVIDENCE OF MALIGNANCY. 2. Esophagus, resection, w/ GE junction - FIBROSIS WITH PATCHY CHRONIC INFLAMMATION. - NO RESIDUAL CARCINOMA IDENTIFIED. - MARGINS NOT INVOLVED. - TWELVE LYMPH NODES WITH NO METASTATIC CARCINOMA (0/12). Microscopic Comment 2. ESOPHAGUS: Specimen: Esophagus with GE junction. Procedure: Esophagogastrectomy. Tumor Site: No residual tumor identified in resection specimen. Relationship of Tumor to esophagogastric junction: Area of fibrosis located at esophagogastric junction. Distance of tumor center from esophagogastric junction: N/A. Tumor Size Greatest dimension: N/A. Histologic Type: Invasive adenocarcinoma, see previous esophagogastric junction biopsy ([id number]). Histologic Grade: See [id number]. Microscopic Tumor Extension: No residual carcinoma in resection specimen. Margins: Free of tumor. Proximal Margin: Free of tumor. Distal Margin: Free of tumor. Circumferential (Adventitial) Margin: Free of tumor. Deep Margin (endoscopic resection specimens): Free of tumor. If margins uninvolved - Distance of invasive carcinoma from closest margin: N/A. Specify margin: N/A. Treatment Effect: Present. Lymph-Vascular Invasion: Not identified. Perineural Invasion: Not identified. Lymph nodes: number examined  12; number positive: 0, see comment. TNM: ypT0, ypN0. Ancillary studies: N/A. 1 of 3 FINAL for ESTEFAN, PATTISON [phone]) Microscopic Comment(continued) Comments: There is a 3 cm area at the gastroesophageal junction which shows fibrosis with hyperemia and patchy chronic inflammation. No residual carcinoma is identified. There are numerous foamy histiocytes in areas of fibrosis with occasional giant cells in six of the twelve lymph nodes. No viable metastatic carcinoma is identified in any of the twelve lymph nodes. (JDP:ah 04/18/17)   Diagnosis 10/26/2016 Esophagogastric junction, biopsy, mass - INVASIVE ADENOCARCINOMA.  Diagnosis 10/09/2016 Esophagus, biopsy, distal esophageal tumor (33-39) - SUSPICIOUS FOR ADENOCARCINOMA, SEE COMMENT.Marland Kitchen  RADIOGRAPHIC STUDIES: I have personally reviewed the radiological images as listed and agreed with the findings in the report.   CT CAP W Contrast 09/11/17  IMPRESSION: 1. Interval esophagectomy with gastric pull-through. No demonstrated complication. There is retained ingested material within the intrathoracic portion of the stomach. 2. No evidence of local recurrence or metastatic disease. 3. Stable small left supraclavicular and superior mediastinal lymph nodes. 4. Mild lower lobe paramediastinal pulmonary opacity bilaterally, likely atelectasis or sequela of prior radiation therapy. 5.  Aortic Atherosclerosis (ICD10-I70.0).  CT Chest and Abdomen W Contrast 04/12/17  IMPRESSION: 1. Mild stable distal esophageal wall thickening. No findings for recurrent tumor. 2. Stable small mediastinal and left supraclavicular lymph nodes. 3. There is a tiny nodule within the medial right lower lobe measuring 3 mm. New from previous exam. Nonspecific. Attention on follow-up imaging advise. 4.  Aortic Atherosclerosis (ICD10-I70.0). 5. Three vessel coronary artery calcification   CT CAP W Contrast 01/18/17 IMPRESSION: 1. Mild stable distal  esophageal wall thickening likely due to radiation change. No findings for recurrent tumor. Small paraesophageal lymph nodes are also stable. 2. No findings for metastatic disease. 3. Stable mild/early emphysematous changes and age advanced atherosclerotic calcifications involving the thoracic and abdominal aorta and branch vessels.   CT AP W Contrast 12/14/16 IMPRESSION: 1. Mildly dilated short segment of proximal jejunum without bowel wall thickening or significant inflammatory changes. Findings may represent focal ileus or radiation enteritis. No evidence for obstruction. 2. Significantly improved appearance of the distal esophagus/proximal stomach with decreased wall thickening (1.1 cm from 2.3 cm), and smaller paraesophageal and gastrohepatic ligament lymph nodes. 3. No bowel obstruction.  Normal appendix.  PET 10/24/2016 IMPRESSION: 1. Markedly hypermetabolic distal esophageal lesion, compatible with malignancy. Adjacent small paraesophageal lymph nodes are abnormal by CT but cannot be resolved as separate structures from the hypermetabolic esophageal activity on the PET images. No hypermetabolism is demonstrated in the upper abdominal/gastrohepatic ligament lymph node although the small size of this lymph node may be below threshold for detection on PET imaging. 2. No evidence for distant hypermetabolic metastatic disease in the neck, chest, abdomen, or pelvis.  CT CAP w Contrast 10/12/2016 IMPRESSION: Distal esophageal mass compatible with primary esophageal malignancy. There are 2 adjacent abnormal appearing subcentimeter paraesophageal lymph nodes which may represent nodal metastasis. Additionally there is a prominent nonspecific 8 mm upper abdominal lymph node.  No evidence for distant metastatic disease in the chest, abdomen or pelvis.  EGD 10/09/2016   ASSESSMENT & PLAN:  King is a 57 y.o. Caucasian male with PMH cerebral palsy and heavy smoking, who  presents with dysphagia, odynophagia and weight loss   1. Low esophageal Adenocarcinoma, cT3N1M0, stage III, ypT0N0  -I previously reviewed the imaging and biopsy pathology results with the patient in detail. Although his biopsy pathology was not definitive for malignancy, based on his endoscopy findings, this is most consistent with esophageal adenocarcinoma. -I previously reviewed his PET scan images with patient in person, which showed intense hypermetabolic esophageal cancer, no other distant metastasis -I previously reviewed the EUS and re-biopsy results, which showed T3N1 disease, and biopsy confirmed adenocarcinoma -We previously reviewed the standard care for locally advanced esophageal cancer, including neoadjuvant chemoradiation, followed by esophagectomy.  -He was previously referred to see thoracic surgeon Dr. Pia Mau. -He has completed neoadjuvant concurrent chemoradiation with weekly carbo and Taxol, tolerated well overall  -His dysphagia has significantly improved, odynophagia has resolved.  -12/14/16 CT abdomen scan reviewed and showed good response in shrinking of tumor.  -He underwent surgery with Dr. [name] on 04/16/17. He had complete response to chemo, radiation, no residual cancers on the surgical samples.  Which is a good prognostic factor.  -This does not rule out microscopic disease and the risk of recurrence.  I recommended cancer surveillance for 5 years. -His surveillance CT from 09/11/17 was previously reviewed and showed esophogeal surgical changes, no evidence of local recurrence or metastatic disease. There is small left supraclavicular and superior mediastinal lymph nodes and concern for atelectasis or sequela of prior radiation  therapy. -He is clinically doing well, his eating, weight and energy have been recovering well post treatment. Physical exam unremarkable.  -F/u in 3 months with surveillance CT scan    2. History of heavy smoking -He has quit smoking  completely, history of 40 pack year.   3. Muscle Spasms in extremities  -I previously suggested magnesium or calcium supplement OTC for his muscle spasms.  -I Prescribed Flexeril to take as needed at night.  -He is no longer taking Flexeril.   4.  Malnutrition and weight loss  -His nutrician and eating has much improved. He has had an esophogeal stretch and manages eating well with the help of Sulcrafate.  -His weight has been stable recently  5.  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 [name] for dilatation  Plan: -F/u in 3 months with lab flush, and CT scan a few days before  -Port flush in 6 weeks  All questions were answered. The patient knows to call the clinic with any problems, questions or concerns.  I spent 20 minutes counseling the patient face to face. The total time spent in the appointment was 30 minutes and more than 50% was on counseling.  Oneal Deputy, am acting as scribe for [name], MD.   I have reviewed the above documentation for accuracy and completeness, and I agree with the above.     [name], MD 01/11/2018

## 2018-01-11 ENCOUNTER — Inpatient Hospital Stay: Payer: Medicaid Other

## 2018-01-11 ENCOUNTER — Telehealth: Payer: Self-pay | Admitting: Hematology

## 2018-01-11 ENCOUNTER — Inpatient Hospital Stay: Payer: Medicaid Other | Attending: Hematology | Admitting: Hematology

## 2018-01-11 ENCOUNTER — Encounter: Payer: Self-pay | Admitting: Hematology

## 2018-01-11 VITALS — BP 134/80 | HR 65 | Temp 98.7°F | Resp 17 | Ht 60.0 in | Wt 121.8 lb

## 2018-01-11 DIAGNOSIS — Z79899 Other long term (current) drug therapy: Secondary | ICD-10-CM | POA: Insufficient documentation

## 2018-01-11 DIAGNOSIS — Z87891 Personal history of nicotine dependence: Secondary | ICD-10-CM | POA: Diagnosis not present

## 2018-01-11 DIAGNOSIS — Z803 Family history of malignant neoplasm of breast: Secondary | ICD-10-CM | POA: Insufficient documentation

## 2018-01-11 DIAGNOSIS — Z800 Family history of malignant neoplasm of digestive organs: Secondary | ICD-10-CM | POA: Insufficient documentation

## 2018-01-11 DIAGNOSIS — R05 Cough: Secondary | ICD-10-CM | POA: Diagnosis not present

## 2018-01-11 DIAGNOSIS — K222 Esophageal obstruction: Secondary | ICD-10-CM | POA: Insufficient documentation

## 2018-01-11 DIAGNOSIS — R634 Abnormal weight loss: Secondary | ICD-10-CM

## 2018-01-11 DIAGNOSIS — C155 Malignant neoplasm of lower third of esophagus: Secondary | ICD-10-CM

## 2018-01-11 DIAGNOSIS — R1310 Dysphagia, unspecified: Secondary | ICD-10-CM | POA: Insufficient documentation

## 2018-01-11 DIAGNOSIS — R070 Pain in throat: Secondary | ICD-10-CM | POA: Diagnosis not present

## 2018-01-11 DIAGNOSIS — E46 Unspecified protein-calorie malnutrition: Secondary | ICD-10-CM

## 2018-01-11 DIAGNOSIS — M62838 Other muscle spasm: Secondary | ICD-10-CM | POA: Diagnosis not present

## 2018-01-11 DIAGNOSIS — Z923 Personal history of irradiation: Secondary | ICD-10-CM | POA: Diagnosis not present

## 2018-01-11 DIAGNOSIS — Z9221 Personal history of antineoplastic chemotherapy: Secondary | ICD-10-CM | POA: Diagnosis not present

## 2018-01-11 DIAGNOSIS — Z95828 Presence of other vascular implants and grafts: Secondary | ICD-10-CM

## 2018-01-11 DIAGNOSIS — G809 Cerebral palsy, unspecified: Secondary | ICD-10-CM | POA: Diagnosis not present

## 2018-01-11 LAB — COMPREHENSIVE METABOLIC PANEL
ALBUMIN: 3.2 g/dL — AB (ref 3.5–5.0)
ALT: 57 U/L — ABNORMAL HIGH (ref 0–55)
AST: 30 U/L (ref 5–34)
Alkaline Phosphatase: 144 U/L (ref 40–150)
Anion gap: 8 (ref 3–11)
BILIRUBIN TOTAL: 0.4 mg/dL (ref 0.2–1.2)
BUN: 15 mg/dL (ref 7–26)
CO2: 25 mmol/L (ref 22–29)
Calcium: 9.2 mg/dL (ref 8.4–10.4)
Chloride: 107 mmol/L (ref 98–109)
Creatinine, Ser: 0.65 mg/dL — ABNORMAL LOW (ref 0.70–1.30)
GFR calc Af Amer: >60 mL/min (ref >60)
GFR calc non Af Amer: >60 mL/min (ref >60)
GLUCOSE: 103 mg/dL (ref 70–140)
POTASSIUM: 3.9 mmol/L (ref 3.5–5.1)
SODIUM: 140 mmol/L (ref 136–145)
TOTAL PROTEIN: 6.5 g/dL (ref 6.4–8.3)

## 2018-01-11 LAB — CBC WITH DIFFERENTIAL/PLATELET
BASOS ABS: 0 10*3/uL (ref 0.0–0.1)
BASOS PCT: 1 %
EOS ABS: 0.1 10*3/uL (ref 0.0–0.5)
Eosinophils Relative: 4 %
HEMATOCRIT: 37.3 % — AB (ref 38.4–49.9)
Hemoglobin: 12.5 g/dL — ABNORMAL LOW (ref 13.0–17.1)
Lymphocytes Relative: 20 %
Lymphs Abs: 0.7 10*3/uL — ABNORMAL LOW (ref 0.9–3.3)
MCH: 30.8 pg (ref 27.2–33.4)
MCHC: 33.6 g/dL (ref 32.0–36.0)
MCV: 91.4 fL (ref 79.3–98.0)
Monocytes Absolute: 0.4 10*3/uL (ref 0.1–0.9)
Monocytes Relative: 10 %
NEUTROS ABS: 2.3 10*3/uL (ref 1.5–6.5)
NEUTROS PCT: 65 %
Platelets: 150 10*3/uL (ref 140–400)
RBC: 4.07 MIL/uL — ABNORMAL LOW (ref 4.20–5.82)
RDW: 13.9 % (ref 11.0–14.6)
WBC: 3.5 10*3/uL — ABNORMAL LOW (ref 4.0–10.3)

## 2018-01-11 LAB — CEA (IN HOUSE-CHCC): CEA (CHCC-IN HOUSE): 1.44 ng/mL (ref 0.00–5.00)

## 2018-01-11 MED ORDER — SODIUM CHLORIDE 0.9% FLUSH
10.0000 mL | Freq: Once | INTRAVENOUS | Status: AC
  Administered 2018-01-11: 10 mL
  Filled 2018-01-11: qty 10

## 2018-01-11 MED ORDER — HEPARIN SOD (PORK) LOCK FLUSH 100 UNIT/ML IV SOLN
500.0000 [IU] | Freq: Once | INTRAVENOUS | Status: AC
  Administered 2018-01-11: 500 [IU]
  Filled 2018-01-11: qty 5

## 2018-01-11 NOTE — Telephone Encounter (Signed)
Scheduled appt per 6/14 los - gave patient AVS and calender per los.   

## 2018-01-21 ENCOUNTER — Other Ambulatory Visit: Payer: Self-pay

## 2018-01-21 ENCOUNTER — Ambulatory Visit (AMBULATORY_SURGERY_CENTER): Payer: Medicaid Other | Admitting: Internal Medicine

## 2018-01-21 ENCOUNTER — Encounter: Payer: Self-pay | Admitting: Internal Medicine

## 2018-01-21 VITALS — BP 107/61 | HR 73 | Temp 98.4°F | Resp 22 | Ht 60.0 in | Wt 121.0 lb

## 2018-01-21 DIAGNOSIS — Z8501 Personal history of malignant neoplasm of esophagus: Secondary | ICD-10-CM | POA: Diagnosis not present

## 2018-01-21 DIAGNOSIS — K913 Postprocedural intestinal obstruction, unspecified as to partial versus complete: Secondary | ICD-10-CM | POA: Diagnosis not present

## 2018-01-21 DIAGNOSIS — K222 Esophageal obstruction: Secondary | ICD-10-CM | POA: Diagnosis not present

## 2018-01-21 MED ORDER — SODIUM CHLORIDE 0.9 % IV SOLN: 500.0000 mL | Freq: Once | INTRAVENOUS | Status: DC

## 2018-01-21 NOTE — Progress Notes (Signed)
A/ox3 pleased with MAC, report to RN 

## 2018-01-21 NOTE — Progress Notes (Signed)
Called to room to assist during endoscopic procedure.  Patient ID and intended procedure confirmed with present staff. Received instructions for my participation in the procedure from the performing physician.  

## 2018-01-21 NOTE — Patient Instructions (Signed)
  Follow instructions on dilation diet, nothing to eat or drink today til 12pm, then clear liquid diet 12pm -2pm, then soft diet rest of day, resume regular diet tomorrow.   YOU HAD AN ENDOSCOPIC PROCEDURE TODAY AT [facility]:   Refer to the procedure report that was given to you for any specific questions about what was found during the examination.  If the procedure report does not answer your questions, please call your gastroenterologist to clarify.  If you requested that your care partner not be given the details of your procedure findings, then the procedure report has been included in a sealed envelope for you to review at your convenience later.  YOU SHOULD EXPECT: Some feelings of bloating in the abdomen. Passage of more gas than usual.  Walking can help get rid of the air that was put into your GI tract during the procedure and reduce the bloating. If you had a lower endoscopy (such as a colonoscopy or flexible sigmoidoscopy) you may notice spotting of blood in your stool or on the toilet paper. If you underwent a bowel prep for your procedure, you may not have a normal bowel movement for a few days.  Please Note:  You might notice some irritation and congestion in your nose or some drainage.  This is from the oxygen used during your procedure.  There is no need for concern and it should clear up in a day or so.  SYMPTOMS TO REPORT IMMEDIATELY:    Following upper endoscopy (EGD)  Vomiting of blood or coffee ground material  New chest pain or pain under the shoulder blades  Painful or persistently difficult swallowing  New shortness of breath  Fever of 100F or higher  Black, tarry-looking stools  For urgent or emergent issues, a gastroenterologist can be reached at any hour by calling [phone].   DIET:  We do recommend a small meal at first, but then you may proceed to your regular diet.  Drink plenty of fluids but you should avoid alcoholic beverages for 24  hours.  ACTIVITY:  You should plan to take it easy for the rest of today and you should NOT DRIVE or use heavy machinery until tomorrow (because of the sedation medicines used during the test).    FOLLOW UP: Our staff will call the number listed on your records the next business day following your procedure to check on you and address any questions or concerns that you may have regarding the information given to you following your procedure. If we do not reach you, we will leave a message.  However, if you are feeling well and you are not experiencing any problems, there is no need to return our call.  We will assume that you have returned to your regular daily activities without incident.  If any biopsies were taken you will be contacted by phone or by letter within the next 1-3 weeks.  Please call us at [phone] if you have not heard about the biopsies in 3 weeks.    SIGNATURES/CONFIDENTIALITY: You and/or your care partner have signed paperwork which will be entered into your electronic medical record.  These signatures attest to the fact that that the information above on your After Visit Summary has been reviewed and is understood.  Full responsibility of the confidentiality of this discharge information lies with you and/or your care-partner.

## 2018-01-21 NOTE — Op Note (Signed)
[facility] Patient Name: Darrell Cunningham Procedure Date: 01/21/2018 10:22 AM MRN: [id number] Endoscopist: [name] , MD Age: 57 Referring MD:  Date of Birth: 09/12/1960 Gender: Male Account #: [id number] Procedure:                Upper GI endoscopy Indications:              Management of operative complication: Dilation of                            anastomotic stricture, Personal history of                            malignant esophageal neoplasm, last EGD with                            dilation 01/01/2018 Medicines:                Monitored Anesthesia Care Procedure:                Pre-Anesthesia Assessment:                           - Prior to the procedure, a History and Physical                            was performed, and patient medications and                            allergies were reviewed. The patient's tolerance of                            previous anesthesia was also reviewed. The risks                            and benefits of the procedure and the sedation                            options and risks were discussed with the patient.                            All questions were answered, and informed consent                            was obtained. Prior Anticoagulants: The patient has                            taken no previous anticoagulant or antiplatelet                            agents. ASA Grade Assessment: III - A patient with                            severe systemic disease. After reviewing the risks  and benefits, the patient was deemed in                            satisfactory condition to undergo the procedure.                           After obtaining informed consent, the endoscope was                            passed under direct vision. Throughout the                            procedure, the patient's blood pressure, pulse, and                            oxygen saturations were monitored continuously.  The                            Endoscope was introduced through the mouth, and                            advanced to the second part of duodenum. The upper                            GI endoscopy was accomplished without difficulty.                            The patient tolerated the procedure well. Scope In: Scope Out: Findings:                 One benign-appearing, intrinsic severe (stenosis;                            an endoscope cannot pass) stenosis was found 20 cm                            from the incisors. This stenosis measured 8 mm                            (inner diameter) x less than one cm (in length).                            The stenosis was traversed after dilation. A TTS                            dilator was passed through the scope. Dilation with                            a 13.5-14.5-15.5 mm balloon dilator was performed                            to 15.5 mm. The dilation site was examined and  showed moderate mucosal disruption and improvement                            in luminal narrowing. Estimated blood loss was                            minimal and self-limited.                           Evidence of a previous surgical anastomosis                            (esophagectomy with gastric pull-through) was found                            in the proximal esophagus/gastric body. This was                            characterized by visible sutures.                           The exam of the stomach was otherwise normal.                           The examined duodenum was normal. Complications:            No immediate complications. Estimated Blood Loss:     Estimated blood loss was minimal. Impression:               - Benign-appearing, post-surgical anastomotic                            esophageal stenosis. Dilated to 15.5 mm                           - Unremarkable stomach.                           - Normal examined duodenum.                            - No specimens collected. Recommendation:           - Patient has a contact number available for                            emergencies. The signs and symptoms of potential                            delayed complications were discussed with the                            patient. Return to normal activities tomorrow.                            Written discharge instructions were provided to the  patient.                           - Full liquid diet for 2 hours.                           - Continue present medications.                           - Repeat upper endoscopy in 1 month for retreatment. [name], MD 01/21/2018 10:44:31 AM This report has been signed electronically.

## 2018-01-22 ENCOUNTER — Telehealth: Payer: Self-pay | Admitting: *Deleted

## 2018-01-22 NOTE — Telephone Encounter (Signed)
  Follow up Call-  Call back number 01/21/2018 01/01/2018 10/25/2017 10/03/2017 08/13/2017 07/16/2017 10/09/2016  Post procedure Call Back phone  # [phone]  Permission to leave phone message Yes - Yes Yes Yes Yes Yes  Some recent data might be hidden     Patient questions:  Do you have a fever, pain , or abdominal swelling? No. Pain Score  0 *  Have you tolerated food without any problems? Yes.    Have you been able to return to your normal activities? Yes.    Do you have any questions about your discharge instructions: Diet   No. Medications  No. Follow up visit  No.  Do you have questions or concerns about your Care? No.  Actions: * If pain score is 4 or above: No action needed, pain <4.

## 2018-02-10 ENCOUNTER — Other Ambulatory Visit: Payer: Self-pay | Admitting: Family Medicine

## 2018-02-10 DIAGNOSIS — K222 Esophageal obstruction: Principal | ICD-10-CM

## 2018-02-10 DIAGNOSIS — K219 Gastro-esophageal reflux disease without esophagitis: Secondary | ICD-10-CM

## 2018-02-21 ENCOUNTER — Ambulatory Visit (AMBULATORY_SURGERY_CENTER): Payer: Self-pay

## 2018-02-21 VITALS — Ht 60.0 in | Wt 121.8 lb

## 2018-02-21 DIAGNOSIS — Z8501 Personal history of malignant neoplasm of esophagus: Secondary | ICD-10-CM

## 2018-02-21 DIAGNOSIS — K9189 Other postprocedural complications and disorders of digestive system: Secondary | ICD-10-CM

## 2018-02-21 NOTE — Progress Notes (Signed)
Per pt, no allergies to soy or egg products.Pt not taking any weight loss meds or using  O2 at home.  Pt refused emmi video. 

## 2018-02-22 ENCOUNTER — Inpatient Hospital Stay: Payer: Medicaid Other | Attending: Hematology

## 2018-02-22 ENCOUNTER — Other Ambulatory Visit: Payer: Self-pay | Admitting: Hematology

## 2018-02-22 DIAGNOSIS — C159 Malignant neoplasm of esophagus, unspecified: Secondary | ICD-10-CM

## 2018-02-22 DIAGNOSIS — Z95828 Presence of other vascular implants and grafts: Secondary | ICD-10-CM

## 2018-02-22 DIAGNOSIS — C155 Malignant neoplasm of lower third of esophagus: Secondary | ICD-10-CM | POA: Diagnosis present

## 2018-02-22 DIAGNOSIS — Z452 Encounter for adjustment and management of vascular access device: Secondary | ICD-10-CM | POA: Diagnosis present

## 2018-02-22 MED ORDER — SODIUM CHLORIDE 0.9% FLUSH
10.0000 mL | Freq: Once | INTRAVENOUS | Status: AC
  Administered 2018-02-22: 10 mL
  Filled 2018-02-22: qty 10

## 2018-02-22 MED ORDER — HEPARIN SOD (PORK) LOCK FLUSH 100 UNIT/ML IV SOLN
500.0000 [IU] | Freq: Once | INTRAVENOUS | Status: AC
  Administered 2018-02-22: 500 [IU]
  Filled 2018-02-22: qty 5

## 2018-02-28 ENCOUNTER — Encounter: Payer: Self-pay | Admitting: Internal Medicine

## 2018-02-28 ENCOUNTER — Ambulatory Visit (AMBULATORY_SURGERY_CENTER): Payer: Medicaid Other | Admitting: Internal Medicine

## 2018-02-28 VITALS — BP 103/66 | HR 79 | Temp 98.6°F | Resp 20 | Ht 60.0 in | Wt 121.0 lb

## 2018-02-28 DIAGNOSIS — R079 Chest pain, unspecified: Secondary | ICD-10-CM | POA: Diagnosis not present

## 2018-02-28 DIAGNOSIS — K222 Esophageal obstruction: Secondary | ICD-10-CM | POA: Diagnosis not present

## 2018-02-28 DIAGNOSIS — J449 Chronic obstructive pulmonary disease, unspecified: Secondary | ICD-10-CM | POA: Diagnosis not present

## 2018-02-28 DIAGNOSIS — C159 Malignant neoplasm of esophagus, unspecified: Secondary | ICD-10-CM | POA: Diagnosis not present

## 2018-02-28 DIAGNOSIS — K219 Gastro-esophageal reflux disease without esophagitis: Secondary | ICD-10-CM | POA: Diagnosis not present

## 2018-02-28 DIAGNOSIS — Z8501 Personal history of malignant neoplasm of esophagus: Secondary | ICD-10-CM

## 2018-02-28 MED ORDER — SODIUM CHLORIDE 0.9 % IV SOLN: 500.0000 mL | Freq: Once | INTRAVENOUS | Status: DC

## 2018-02-28 NOTE — Op Note (Signed)
[facility] Patient Name: Darrell Cunningham Procedure Date: 02/28/2018 9:34 AM MRN: [id number] Endoscopist: [name] , MD Age: 57 Referring MD:  Date of Birth: 11-20-1960 Gender: Male Account #: [id number] Procedure:                Upper GI endoscopy Indications:              Dysphagia, For therapy of post-surgical anastomotic                            stenosis, last EGD 01/21/2018 Medicines:                Monitored Anesthesia Care Procedure:                Pre-Anesthesia Assessment:                           - Prior to the procedure, a History and Physical                            was performed, and patient medications and                            allergies were reviewed. The patient's tolerance of                            previous anesthesia was also reviewed. The risks                            and benefits of the procedure and the sedation                            options and risks were discussed with the patient.                            All questions were answered, and informed consent                            was obtained. Prior Anticoagulants: The patient has                            taken no previous anticoagulant or antiplatelet                            agents. ASA Grade Assessment: III - A patient with                            severe systemic disease. After reviewing the risks                            and benefits, the patient was deemed in                            satisfactory condition to undergo the procedure.  After obtaining informed consent, the endoscope was                            passed under direct vision. Throughout the                            procedure, the patient's blood pressure, pulse, and                            oxygen saturations were monitored continuously. The                            Endoscope was introduced through the mouth, and                            advanced to the second  part of duodenum. The upper                            GI endoscopy was accomplished without difficulty.                            The patient tolerated the procedure well. Scope In: Scope Out: Findings:                 One benign-appearing, intrinsic severe (stenosis;                            an endoscope cannot pass) stenosis was found 20 cm                            from the incisors. This stenosis measured 8 mm                            (inner diameter) x 1 cm (in length). The stenosis                            was traversed after dilation. A TTS dilator was                            passed through the scope. Dilation with a                            13.5-14.5-15.5 mm balloon dilator was performed to                            15.5 mm. The dilation site was examined and showed                            moderate mucosal disruption and complete resolution                            of luminal narrowing. Estimated blood loss was  minimal.                           Evidence of a previous surgical anastomosis was                            found in the gastric body (esophagectomy with                            pull-through).                           The exam of the stomach was otherwise normal.                           The examined duodenum was normal. Complications:            No immediate complications. Estimated Blood Loss:     Estimated blood loss was minimal. Impression:               - Benign-appearing esophageal stenosis. Dilated.                           - A previous surgical anastomosis was found in the                            proximal stomach.                           - Normal examined duodenum.                           - No specimens collected. Recommendation:           - Patient has a contact number available for                            emergencies. The signs and symptoms of potential                            delayed  complications were discussed with the                            patient. Return to normal activities tomorrow.                            Written discharge instructions were provided to the                            patient.                           - Advance diet as tolerated.                           - Continue present medications.                           -  Repeat upper endoscopy in 4-6 weeks for                            retreatment. [name], MD 02/28/2018 10:05:14 AM This report has been signed electronically.

## 2018-02-28 NOTE — Patient Instructions (Signed)
YOU HAD AN ENDOSCOPIC PROCEDURE TODAY AT [facility]:   Refer to the procedure report that was given to you for any specific questions about what was found during the examination.  If the procedure report does not answer your questions, please call your gastroenterologist to clarify.  If you requested that your care partner not be given the details of your procedure findings, then the procedure report has been included in a sealed envelope for you to review at your convenience later.  YOU SHOULD EXPECT: Some feelings of bloating in the abdomen. Passage of more gas than usual.  Walking can help get rid of the air that was put into your GI tract during the procedure and reduce the bloating. If you had a lower endoscopy (such as a colonoscopy or flexible sigmoidoscopy) you may notice spotting of blood in your stool or on the toilet paper. If you underwent a bowel prep for your procedure, you may not have a normal bowel movement for a few days.  Please Note:  You might notice some irritation and congestion in your nose or some drainage.  This is from the oxygen used during your procedure.  There is no need for concern and it should clear up in a day or so.  SYMPTOMS TO REPORT IMMEDIATELY:   Following upper endoscopy (EGD)  Vomiting of blood or coffee ground material  New chest pain or pain under the shoulder blades  Painful or persistently difficult swallowing  New shortness of breath  Fever of 100F or higher  Black, tarry-looking stools  For urgent or emergent issues, a gastroenterologist can be reached at any hour by calling [phone].   DIET:  Advance diet as tolerated.  Drink plenty of fluids but you should avoid alcoholic beverages for 24 hours.  ACTIVITY:  You should plan to take it easy for the rest of today and you should NOT DRIVE or use heavy machinery until tomorrow (because of the sedation medicines used during the test).    FOLLOW UP: Our staff will call the  number listed on your records the next business day following your procedure to check on you and address any questions or concerns that you may have regarding the information given to you following your procedure. If we do not reach you, we will leave a message.  However, if you are feeling well and you are not experiencing any problems, there is no need to return our call.  We will assume that you have returned to your regular daily activities without incident.  If any biopsies were taken you will be contacted by phone or by letter within the next 1-3 weeks.  Please call us at [phone] if you have not heard about the biopsies in 3 weeks.    SIGNATURES/CONFIDENTIALITY: You and/or your care partner have signed paperwork which will be entered into your electronic medical record.  These signatures attest to the fact that that the information above on your After Visit Summary has been reviewed and is understood.  Full responsibility of the confidentiality of this discharge information lies with you and/or your care-partner.

## 2018-02-28 NOTE — Progress Notes (Signed)
Report to PACU, RN, vss, BBS= Clear.  

## 2018-02-28 NOTE — Progress Notes (Signed)
Pt's states no medical or surgical changes since previsit or office visit. 

## 2018-02-28 NOTE — Progress Notes (Signed)
Called to room to assist during endoscopic procedure.  Patient ID and intended procedure confirmed with present staff. Received instructions for my participation in the procedure from the performing physician.  

## 2018-03-01 ENCOUNTER — Telehealth: Payer: Self-pay

## 2018-03-01 NOTE — Telephone Encounter (Signed)
  Follow up Call-  Call back number 02/28/2018 01/21/2018 01/01/2018 10/25/2017 10/03/2017 08/13/2017 07/16/2017  Post procedure Call Back phone  # [phone]  Permission to leave phone message Yes Yes - Yes Yes Yes Yes  Some recent data might be hidden     Patient questions:  Do you have a fever, pain , or abdominal swelling? No. Pain Score  0 *  Have you tolerated food without any problems? Yes.    Have you been able to return to your normal activities? Yes.    Do you have any questions about your discharge instructions: Diet   No. Medications  No. Follow up visit  No.  Do you have questions or concerns about your Care? No.  Actions: * If pain score is 4 or above: No action needed, pain <4.

## 2018-03-04 ENCOUNTER — Other Ambulatory Visit: Payer: Self-pay | Admitting: Internal Medicine

## 2018-03-14 ENCOUNTER — Ambulatory Visit: Payer: Medicaid Other | Admitting: Cardiothoracic Surgery

## 2018-03-14 NOTE — Progress Notes (Signed)
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 PROCEDURES PERFORMED:  Bronchoscopy, transhiatal total esophagectomy with cervical esophagogastrostomy, pyloromyotomy, and feeding jejunostomy tube. SURGEON:  [name], MD.  Cancer Staging Malignant neoplasm of lower third of esophagus [facility]) Staging form: Esophagus - Adenocarcinoma, AJCC 8th Edition - Clinical stage from 10/26/2016: Stage III (cT3, cN1, cM0) - Signed by [name], MD on 11/05/2016 - Pathologic stage from 04/20/2017: Stage I (ypT0, pN0, cM0, GX) - Signed by [name], MD on 04/23/2017   History of Present Illness:      Patient returns to the office today for postop check after  Esophagectomy September 2018.  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 [name] .  The interval between dilatations has increased.  the patient notes that his p.o. intake is slowly increased he still bothered by milk products.   He is to have a CT scan of the chest and abdomen next month  Zubrod Score: At the time of surgery this patient's most appropriate activity status/level should be described as: [x]     0    Normal activity, no symptoms [x]     1    Restricted in physical strenuous activity but ambulatory, able to do out light work []     2    Ambulatory and capable of self care, unable to do work activities, up and about                 >50 % of waking hours                                                                                    []     3    Only limited self care, in bed greater than 50% of waking hours []     4    Completely disabled, no self care, confined to bed or chair []     5    Moribund  Social History   Tobacco Use  Smoking Status Former Smoker  . Packs/day: 1.00  . Years: 38.00  . Pack years: 38.00  . Types: Cigarettes  . Start date: 10/31/2012  . Last attempt to quit: 10/31/2012  . Years since quitting: 5.3  Smokeless Tobacco Never Used       Allergies  Allergen Reactions  . Penicillins Hives, Itching and Swelling    Has patient had a PCN reaction causing immediate rash, facial/tongue/throat swelling, SOB or lightheadedness with hypotension: No Has patient had a PCN reaction causing severe rash involving mucus membranes or skin necrosis: No Has patient had a  PCN reaction that required hospitalization No Has patient had a PCN reaction occurring within the last 10 years: No If all of the above answers are "NO", then may proceed with Cephalosporin use. Unknown Childhood reaction    Current Outpatient Medications  Medication Sig Dispense Refill  . acetaminophen (TYLENOL) 160 MG/5ML solution Take 10.2 mLs (325 mg total) by mouth every 4 (four) hours as needed for moderate pain. 120 mL 0  . albuterol (ACCUNEB) 0.63 MG/3ML nebulizer solution Take 1 ampule by nebulization every 6 (six) hours as needed for wheezing.    Marland Kitchen CARAFATE 1 GM/10ML suspension TAKE 10 MLS BY MOUTH 4 TIMES A DAY BEFORE MEALS AND AT BEDTIME 1200 mL 2  . cyclobenzaprine (FLEXERIL) 5 MG tablet TAKE 1 TABLET BY MOUTH THREE TIMES A DAY AS NEEDED FOR MUSCLE SPASMS 30 tablet 0  . Electrolyte-S (ISOLYTE-S IV) Inject into the vein. Drinks orally use to use through feeding tube.    . fluticasone (FLONASE) 50 MCG/ACT nasal spray Place 1 spray into both nostrils daily as needed for allergies or rhinitis.    . fluticasone (FLOVENT DISKUS) 50 MCG/BLIST diskus inhaler Inhale into the lungs.    . lactose  free nutrition (BOOST) LIQD Take 237 mLs 2 (two) times daily between meals by mouth.    . lidocaine-prilocaine (EMLA) cream APPLY TO PORT-A-CATH SITE 1.5 TO 2 HOURS PRIOR TO PROCEDURE AS NEEDED 30 g 1  . pantoprazole (PROTONIX) 40 MG tablet TAKE 1 TABLET BY MOUTH TWICE A DAY 60 tablet 11   No current facility-administered medications for this visit.     Physical Exam: BP 106/67   Pulse 85   Resp 18   Ht 5' (1.524 m)   Wt 127 lb (57.6 kg)   SpO2 96% Comment: RA  BMI 24.80 kg/m   General appearance: alert, cooperative and no distress Head: Normocephalic, without obvious abnormality, atraumatic Neck: no adenopathy, no carotid bruit, no JVD, supple, symmetrical, trachea midline and thyroid not enlarged, symmetric, no tenderness/mass/nodules Lymph nodes: Cervical, supraclavicular, and axillary nodes normal. Resp: clear to auscultation bilaterally GI: soft, non-tender; bowel sounds normal; no masses,  no organomegaly Extremities: extremities normal, atraumatic, no cyanosis or edema and Homans sign is negative, no sign of DVT Neurologic: Grossly normal Patient's left neck incision and abdominal incision are well-healed without evidence of herniation.  Diagnostic Studies & Laboratory data:         Recent Radiology Findings:  Recent Labs: Lab Results  Component Value Date   WBC 3.5 (L) 01/11/2018   HGB 12.5 (L) 01/11/2018   HCT 37.3 (L) 01/11/2018   PLT 150 01/11/2018   GLUCOSE 103 01/11/2018   CHOL 157 10/27/2014   TRIG 83 10/27/2014   HDL 34 (L) 10/27/2014   LDLCALC 106 (H) 10/27/2014   ALT 57 (H) 01/11/2018   AST 30 01/11/2018   NA 140 01/11/2018   K 3.9 01/11/2018   CL 107 01/11/2018   CREATININE 0.65 (L) 01/11/2018   BUN 15 01/11/2018   CO2 25 01/11/2018   INR 0.91 04/12/2017   HGBA1C 5.7 (H) 10/27/2014   Wt Readings from Last 3 Encounters:  03/15/18 127 lb (57.6 kg)  02/28/18 121 lb (54.9 kg)  02/21/18 121 lb 12.8 oz (55.2 kg)      Assessment / Plan:     Patient pathologic stage I esophageal cancer treated preoperatively with radiation and chemotherapy and subsequent esophagectomy.  He had trouble postoperatively with anastomotic stricture, which has been responding to intermittent dilatations well.  Hopefully over time the interval between dilatations will continue to increase.  Overall pleased with the patient's progress, currently he is without evidence of recurrent disease he is to have a follow-up CT of the chest and abdomen next month.  Plan to see him back in 3 months.   [name] 03/15/2018 11:37 AM

## 2018-03-15 ENCOUNTER — Ambulatory Visit: Payer: Medicaid Other | Admitting: Cardiothoracic Surgery

## 2018-03-15 VITALS — BP 106/67 | HR 85 | Resp 18 | Ht 60.0 in | Wt 127.0 lb

## 2018-03-15 DIAGNOSIS — C159 Malignant neoplasm of esophagus, unspecified: Secondary | ICD-10-CM

## 2018-03-15 DIAGNOSIS — Z09 Encounter for follow-up examination after completed treatment for conditions other than malignant neoplasm: Secondary | ICD-10-CM

## 2018-03-20 ENCOUNTER — Other Ambulatory Visit: Payer: Self-pay | Admitting: Hematology

## 2018-03-20 DIAGNOSIS — C155 Malignant neoplasm of lower third of esophagus: Secondary | ICD-10-CM

## 2018-04-05 ENCOUNTER — Ambulatory Visit (AMBULATORY_SURGERY_CENTER): Payer: Self-pay | Admitting: *Deleted

## 2018-04-05 VITALS — Ht 60.0 in | Wt 127.0 lb

## 2018-04-05 DIAGNOSIS — K222 Esophageal obstruction: Secondary | ICD-10-CM

## 2018-04-05 NOTE — Progress Notes (Signed)
No egg or soy allergy  No trouble with anesthesia or intubation per pt  No diet medications taken

## 2018-04-06 IMAGING — RF DG ESOPHAGUS
7 of 8 series · 13 of 17 positions shown · non-contrast
Comparison: None.

CLINICAL DATA: 48-year-old male status post total esophagectomy for
esophageal carcinoma. Assess for anastomotic leak and gastric
emptying.

EXAM:
ESOPHOGRAM/BARIUM SWALLOW
TECHNIQUE: Single contrast examination was performed using water soluble
contrast.
FLUOROSCOPY TIME:  Fluoroscopy Time:  2 minutes and 42 seconds
Radiation Exposure Index (if provided by the fluoroscopic device):
22.1 mGy

[Series 1: fluoro_barium 2fps_bw · 0.17mm/px · 1 of 1 slices shown (1 of 4)]
[im 1/1]
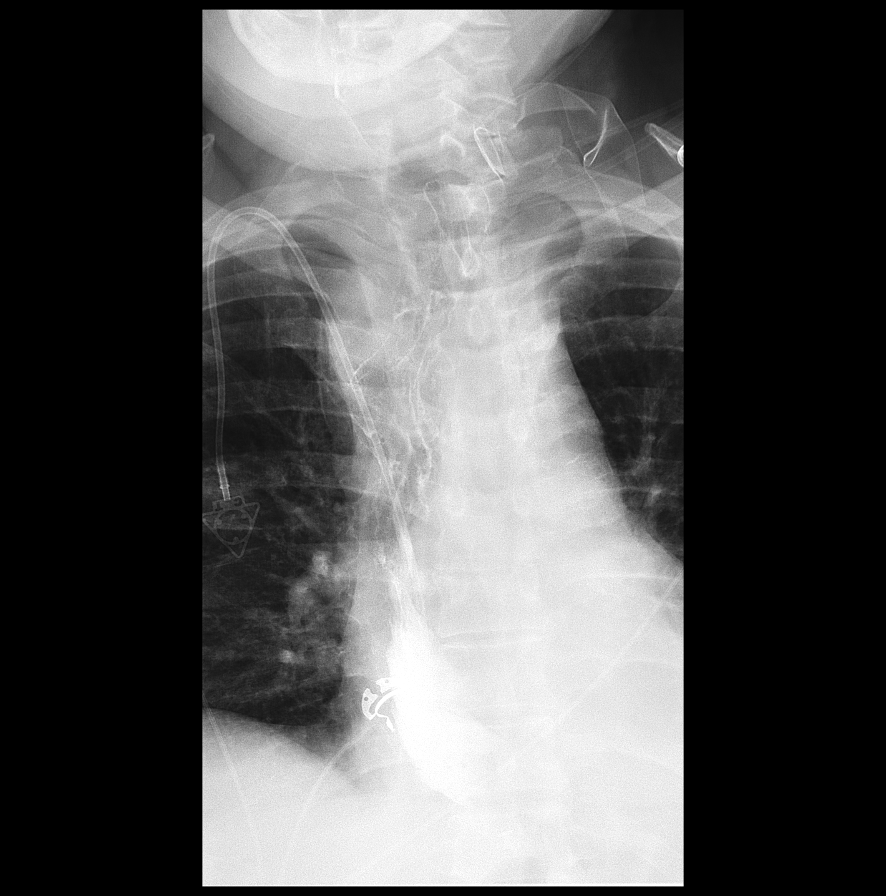

[Series 2: cp_standard · 0.51mm/px · 3 of 23 frames shown (1 of 3)]
[frame 4/23]
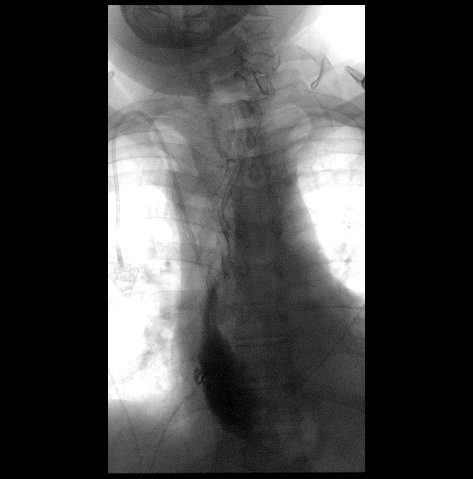
[frame 12/23]
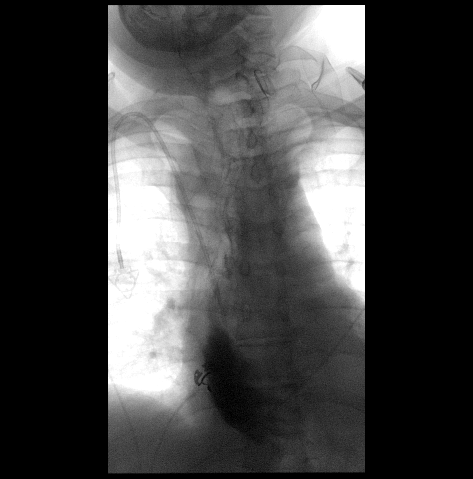
[frame 20/23]
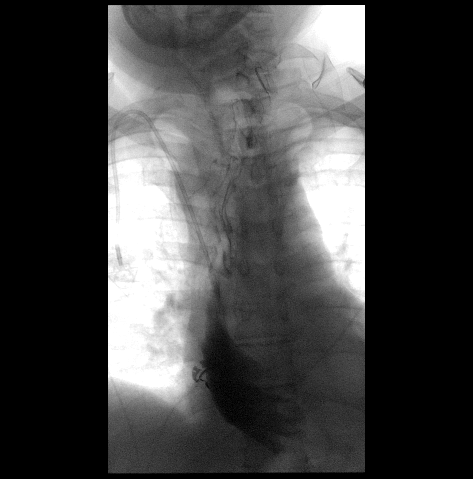

[Series 3: fluoro_barium 2fps_bw · 0.17mm/px · 1 of 1 slices shown (2 of 4)]
[im 1/1]
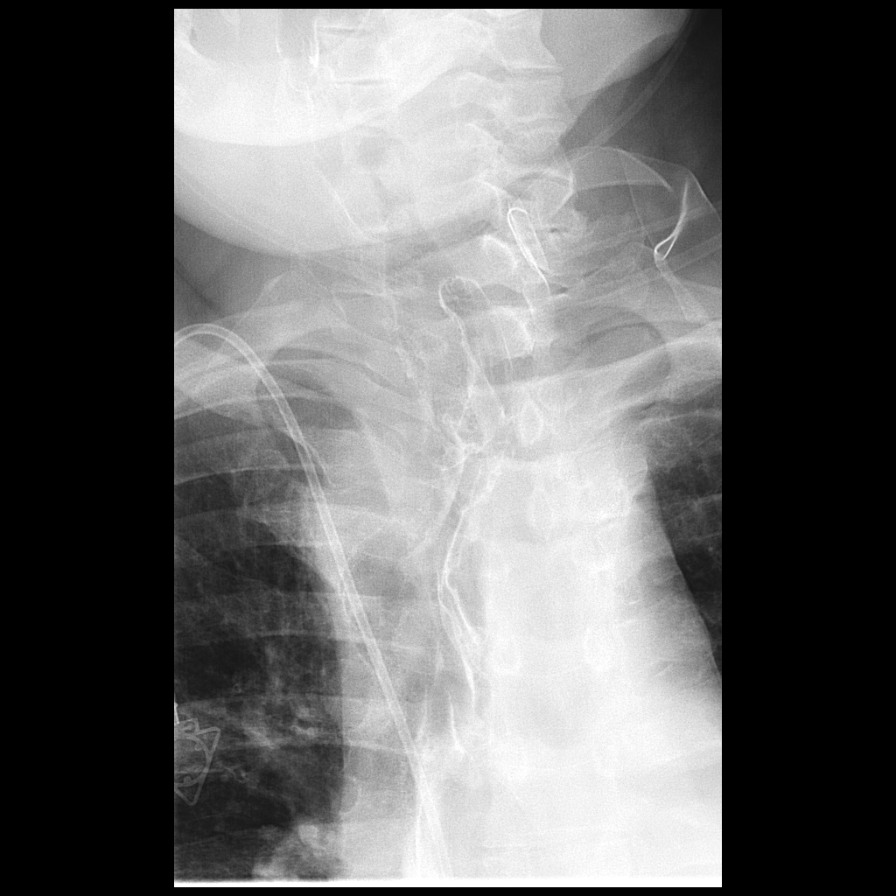

[Series 4: cp_standard · 0.35mm/px · 3 of 36 frames shown (2 of 3)]
[frame 10/36]
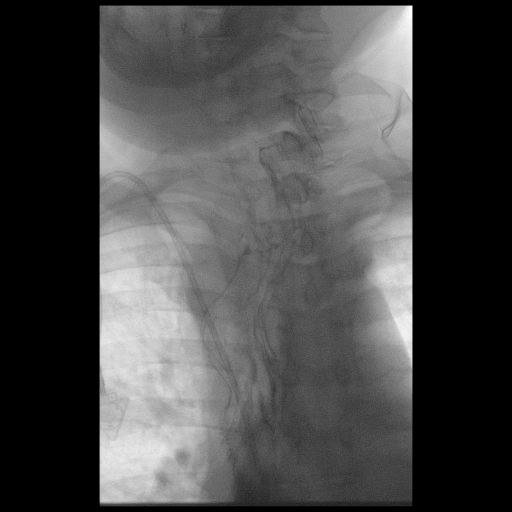
[frame 19/36]
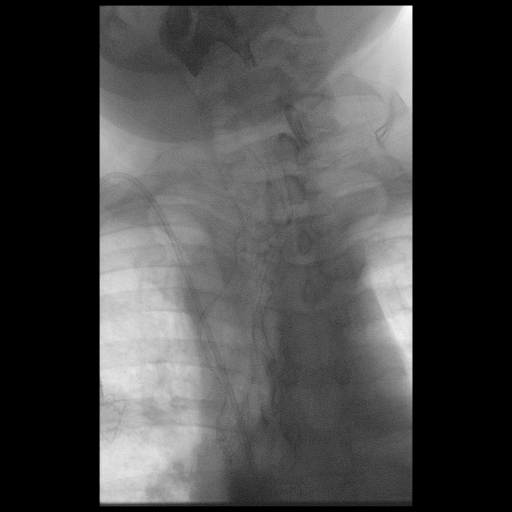
[frame 31/36]
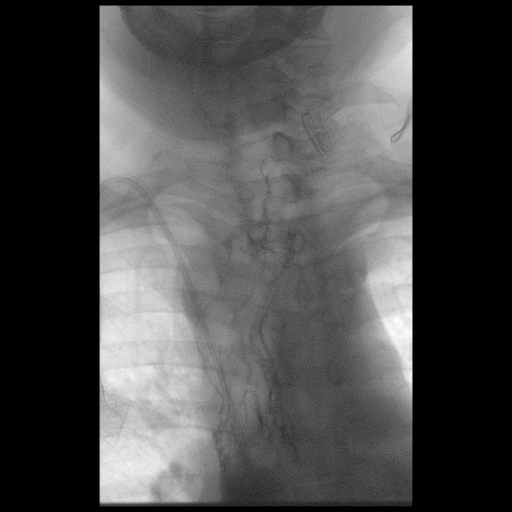

[Series 6: cp_standard · 0.34mm/px · 3 of 10 frames shown (3 of 3)]
[frame 2/10]
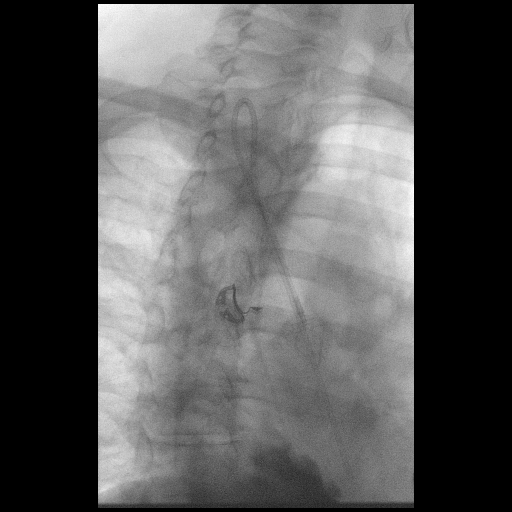
[frame 3/10]
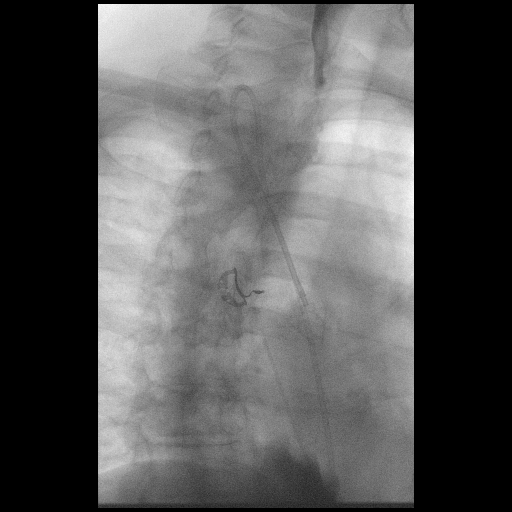
[frame 6/10]
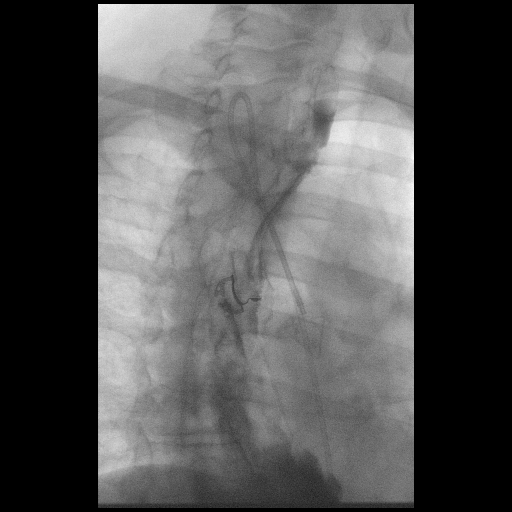

[Series 7: fluoro_barium 2fps_bw · 0.17mm/px · 1 of 1 slices shown (3 of 4)]
[im 1/1]
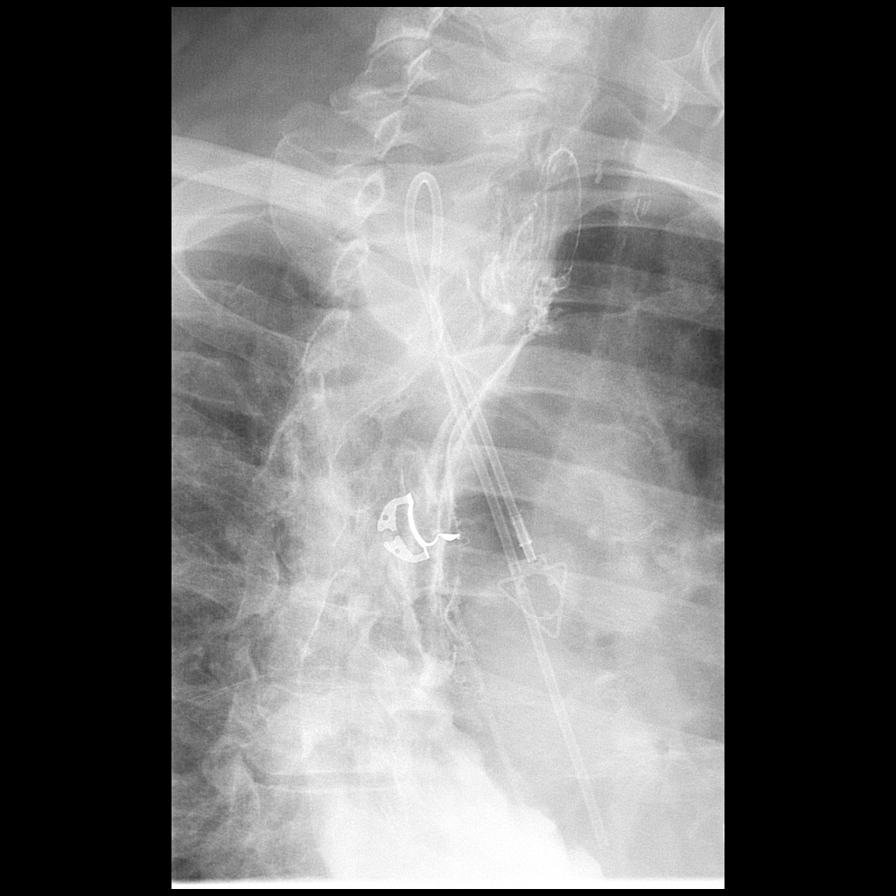

[Series 8: fluoro_barium 2fps_bw · 0.17mm/px · 1 of 1 slices shown (4 of 4)]
[im 1/1]
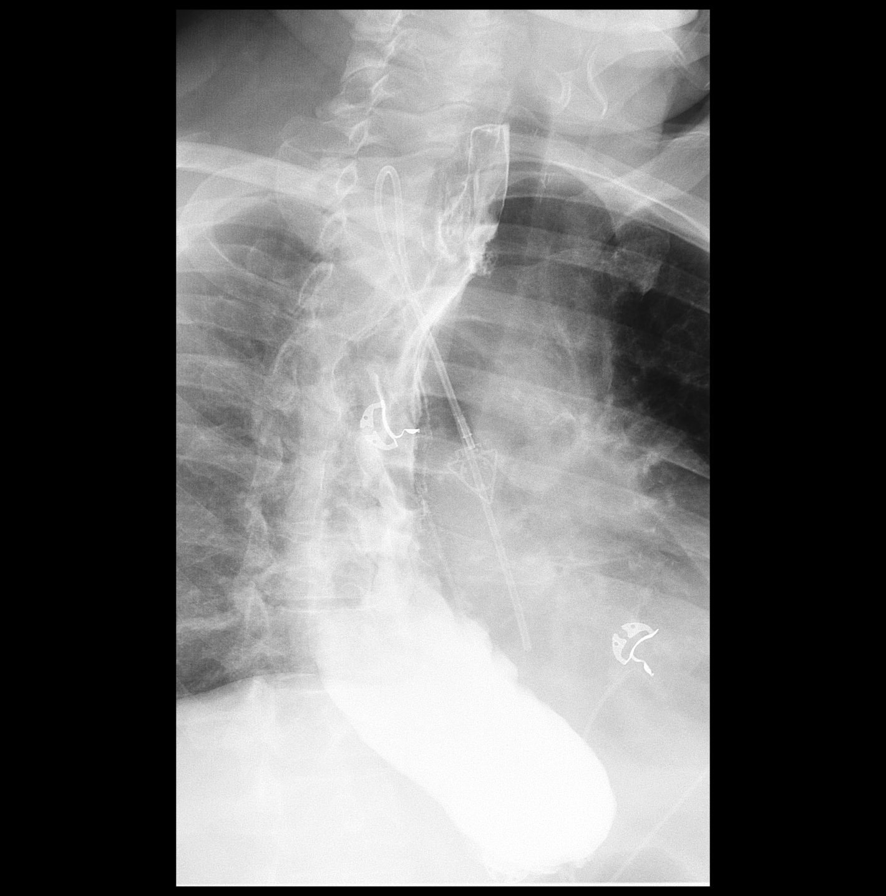

[13 of 17 positions shown; findings below may reference images not displayed]

FINDINGS: Multiple swallow attempts were observed in supine and left lateral
decubitus position. During the examination, postoperative changes of
esophagectomy and gastric pull-through were noted, without evidence
of anastomotic leak. Despite greater than 5 minutes of observation,
no contrast material was visualized extending from the stomach into
the proximal small bowel.
IMPRESSION: 1. Status post esophagectomy, without evidence of anastomotic leak.
2. Gastric emptying could not be confirmed despite greater than 5
minutes of observation during today's examination. This likely
reflects postoperative edema at the site of pylorotomy. Consider
repeat evaluation in 1-2 days if clinically appropriate.

## 2018-04-06 IMAGING — CR DG CHEST 2V
2 series · 2 of 2 positions shown · non-contrast
Comparison: 04/21/2017, 04/20/2017 and earlier.

CLINICAL DATA: Postop day 7 esophagectomy and gastric pull-through.
Follow-up bibasilar atelectasis and small effusions.

EXAM:
CHEST  2 VIEW

[chest lat]
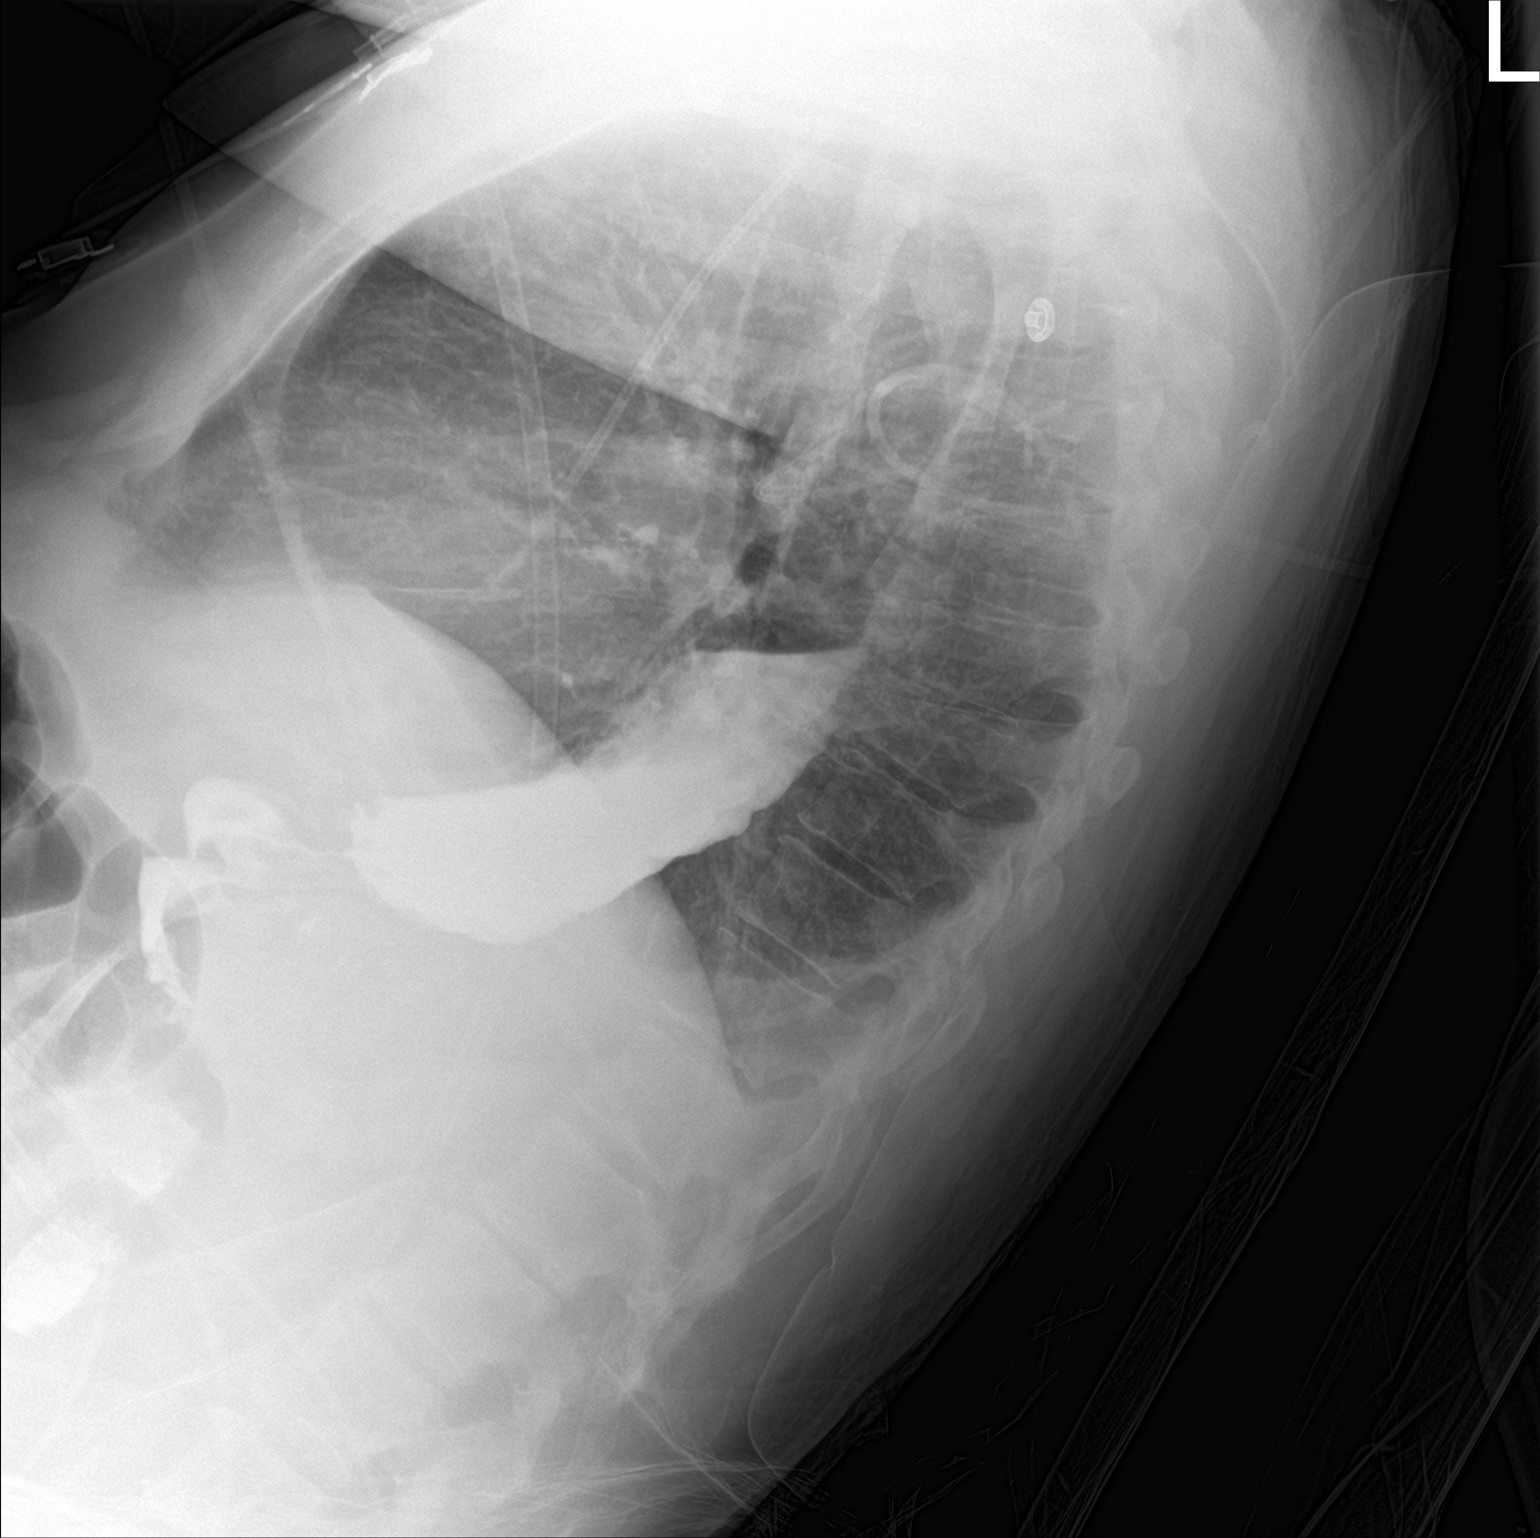

[chest ap]
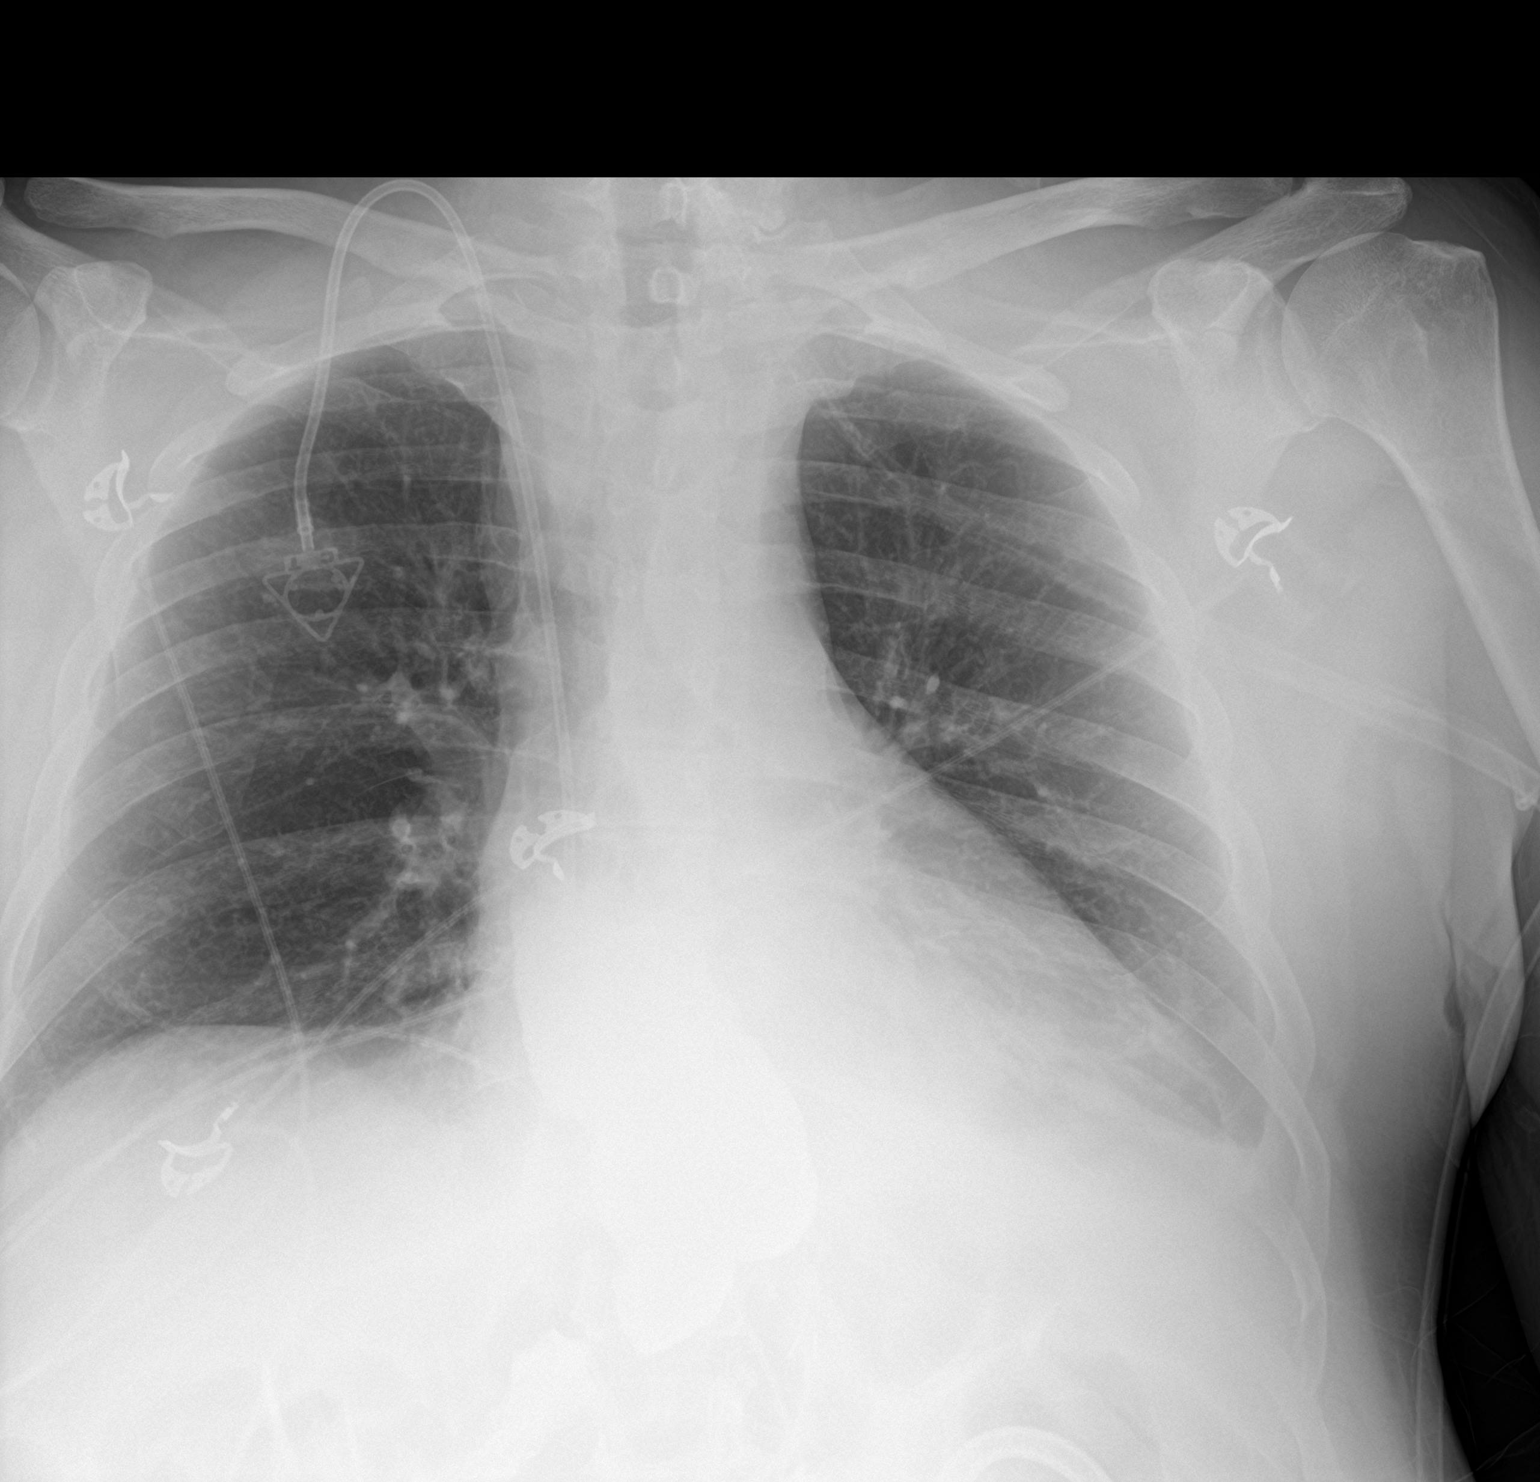

[2 of 2 positions shown; findings below may reference images not displayed]

FINDINGS: Right jugular Port-A-Cath tip projects over the expected location of
the cavoatrial junction, unchanged. Improved aeration in the lung
bases since the examination 2 days ago. Persistent mild left basilar
atelectasis and small left pleural effusion. No new pulmonary
parenchymal abnormalities. Residual water-soluble contrast within
the intrathoracic stomach related to the esophagram earlier today.
IMPRESSION: 1. Improved aeration in the lung bases since examination 2 days ago,
with only mild left basilar atelectasis persisting.
2. Stable small left pleural effusion.
3. No new abnormalities.

## 2018-04-10 ENCOUNTER — Inpatient Hospital Stay: Payer: Medicaid Other | Attending: Hematology

## 2018-04-10 ENCOUNTER — Inpatient Hospital Stay: Payer: Medicaid Other

## 2018-04-10 ENCOUNTER — Encounter (HOSPITAL_COMMUNITY): Payer: Self-pay

## 2018-04-10 ENCOUNTER — Ambulatory Visit (HOSPITAL_COMMUNITY)
Admission: RE | Admit: 2018-04-10 | Discharge: 2018-04-10 | Disposition: A | Discharge status: Home / Self Care | Payer: Medicaid Other | Source: Ambulatory Visit | Attending: Hematology | Admitting: Hematology

## 2018-04-10 DIAGNOSIS — I700 Atherosclerosis of aorta: Secondary | ICD-10-CM | POA: Insufficient documentation

## 2018-04-10 DIAGNOSIS — K222 Esophageal obstruction: Secondary | ICD-10-CM | POA: Insufficient documentation

## 2018-04-10 DIAGNOSIS — G809 Cerebral palsy, unspecified: Secondary | ICD-10-CM | POA: Diagnosis not present

## 2018-04-10 DIAGNOSIS — C155 Malignant neoplasm of lower third of esophagus: Secondary | ICD-10-CM

## 2018-04-10 DIAGNOSIS — Z800 Family history of malignant neoplasm of digestive organs: Secondary | ICD-10-CM | POA: Insufficient documentation

## 2018-04-10 DIAGNOSIS — I2510 Atherosclerotic heart disease of native coronary artery without angina pectoris: Secondary | ICD-10-CM | POA: Insufficient documentation

## 2018-04-10 DIAGNOSIS — Z87891 Personal history of nicotine dependence: Secondary | ICD-10-CM | POA: Diagnosis not present

## 2018-04-10 DIAGNOSIS — Z95828 Presence of other vascular implants and grafts: Secondary | ICD-10-CM

## 2018-04-10 DIAGNOSIS — C159 Malignant neoplasm of esophagus, unspecified: Secondary | ICD-10-CM | POA: Diagnosis not present

## 2018-04-10 DIAGNOSIS — Z79899 Other long term (current) drug therapy: Secondary | ICD-10-CM | POA: Insufficient documentation

## 2018-04-10 DIAGNOSIS — Z923 Personal history of irradiation: Secondary | ICD-10-CM | POA: Diagnosis not present

## 2018-04-10 DIAGNOSIS — J438 Other emphysema: Secondary | ICD-10-CM | POA: Diagnosis not present

## 2018-04-10 DIAGNOSIS — E46 Unspecified protein-calorie malnutrition: Secondary | ICD-10-CM | POA: Insufficient documentation

## 2018-04-10 DIAGNOSIS — Z452 Encounter for adjustment and management of vascular access device: Secondary | ICD-10-CM | POA: Insufficient documentation

## 2018-04-10 LAB — COMPREHENSIVE METABOLIC PANEL
ALBUMIN: 3.3 g/dL — AB (ref 3.5–5.0)
ALK PHOS: 123 U/L (ref 38–126)
ALT: 17 U/L (ref 0–44)
ANION GAP: 8 (ref 5–15)
AST: 18 U/L (ref 15–41)
BUN: 14 mg/dL (ref 6–20)
CALCIUM: 9.1 mg/dL (ref 8.9–10.3)
CO2: 26 mmol/L (ref 22–32)
Chloride: 108 mmol/L (ref 98–111)
Creatinine, Ser: 0.69 mg/dL (ref 0.61–1.24)
GFR calc Af Amer: >60 mL/min (ref >60)
GFR calc non Af Amer: >60 mL/min (ref >60)
GLUCOSE: 96 mg/dL (ref 70–99)
Potassium: 4 mmol/L (ref 3.5–5.1)
SODIUM: 142 mmol/L (ref 135–145)
Total Bilirubin: 0.3 mg/dL (ref 0.3–1.2)
Total Protein: 6.8 g/dL (ref 6.5–8.1)

## 2018-04-10 LAB — CEA (IN HOUSE-CHCC): CEA (CHCC-In House): 1.58 ng/mL (ref 0.00–5.00)

## 2018-04-10 LAB — CBC WITH DIFFERENTIAL/PLATELET
BASOS PCT: 0 %
Basophils Absolute: 0 10*3/uL (ref 0.0–0.1)
Eosinophils Absolute: 0.2 10*3/uL (ref 0.0–0.5)
Eosinophils Relative: 4 %
HEMATOCRIT: 39.1 % (ref 38.4–49.9)
Hemoglobin: 13.1 g/dL (ref 13.0–17.1)
Lymphocytes Relative: 25 %
Lymphs Abs: 1.2 10*3/uL (ref 0.9–3.3)
MCH: 30.7 pg (ref 27.2–33.4)
MCHC: 33.5 g/dL (ref 32.0–36.0)
MCV: 91.6 fL (ref 79.3–98.0)
MONO ABS: 0.4 10*3/uL (ref 0.1–0.9)
MONOS PCT: 9 %
NEUTROS ABS: 3.1 10*3/uL (ref 1.5–6.5)
Neutrophils Relative %: 62 %
Platelets: 153 10*3/uL (ref 140–400)
RBC: 4.27 MIL/uL (ref 4.20–5.82)
RDW: 14 % (ref 11.0–14.6)
WBC: 5 10*3/uL (ref 4.0–10.3)

## 2018-04-10 MED ORDER — HEPARIN SOD (PORK) LOCK FLUSH 100 UNIT/ML IV SOLN
500.0000 [IU] | Freq: Once | INTRAVENOUS | Status: DC
  Filled 2018-04-10: qty 5

## 2018-04-10 MED ORDER — HEPARIN SOD (PORK) LOCK FLUSH 100 UNIT/ML IV SOLN
500.0000 [IU] | Freq: Once | INTRAVENOUS | Status: AC
  Administered 2018-04-10: 500 [IU] via INTRAVENOUS

## 2018-04-10 MED ORDER — SODIUM CHLORIDE 0.9% FLUSH
10.0000 mL | Freq: Once | INTRAVENOUS | Status: AC
  Administered 2018-04-10: 10 mL
  Filled 2018-04-10: qty 10

## 2018-04-10 MED ORDER — IOHEXOL 300 MG/ML  SOLN
100.0000 mL | Freq: Once | INTRAMUSCULAR | Status: AC | PRN
  Administered 2018-04-10: 100 mL via INTRAVENOUS

## 2018-04-10 MED ORDER — HEPARIN SOD (PORK) LOCK FLUSH 100 UNIT/ML IV SOLN
INTRAVENOUS | Status: AC
  Administered 2018-04-10: 500 [IU] via INTRAVENOUS
  Filled 2018-04-10: qty 5

## 2018-04-10 NOTE — Progress Notes (Signed)
[place]  [phone] Fax:(336) [phone]  Clinic Follow-up Note   Patient Care Team: [name], MD as PCP - General (Family Medicine)   Date of Service:  04/12/2018   CHIEF COMPLAINTS:  Follow up esophageal adenocarcinoma   Oncology History   Cancer Staging Esophageal cancer [facility]) Staging form: Esophagus - Adenocarcinoma, AJCC 8th Edition - Clinical stage from 10/26/2016: Stage III (cT3, cN1, cM0) - Signed by [name], MD on 11/05/2016       Malignant neoplasm of lower third of esophagus ([place])   09/21/2016 - 09/21/2016 Hospital Admission    esophageal pain and vomiting up blood    10/09/2016 Initial Diagnosis    Esophageal cancer ([place])    10/09/2016 Procedure    EGD 1. Partially obstructing, likely malignant esophageal tumor was found in the lower third of the esophagus. Multiple biopsies.  2. Mass visible during gastric retroflexion at GE junction 3. Otherwise normal stomach 4. Normal examined duodenum     10/09/2016 Pathology Results    Esophagus, biopsy, distal esophageal tumor (33-39) - SUSPICIOUS FOR ADENOCARCINOMA    10/12/2016 Imaging    CT CAP w Contrast IMPRESSION: Distal esophageal mass compatible with primary esophageal malignancy. There are 2 adjacent abnormal appearing subcentimeter paraesophageal lymph nodes which may represent nodal metastasis. Additionally there is a prominent nonspecific 8 mm upper abdominal lymph node.  No evidence for distant metastatic disease in the chest, abdomen or pelvis.    10/24/2016 PET scan    1. Markedly hypermetabolic distal esophageal lesion, compatible with malignancy. Adjacent small paraesophageal lymph nodes are abnormal by CT but cannot be resolved as separate structures from the hypermetabolic esophageal activity on the PET images. No hypermetabolism is demonstrated in the upper abdominal/gastrohepatic ligament lymph node although the small size of this lymph node may be  below threshold for detection on PET imaging. 2. No evidence for distant hypermetabolic metastatic disease in the neck, chest, abdomen, or pelvis.    10/26/2016 Pathology Results    Esophagogastric junction, biopsy, mass - INVASIVE ADENOCARCINOMA.    10/26/2016 Procedure    EUS showed uT3N1 disease, and biopsy confirmed adenocarcinoma     10/30/2016 - 12/07/2016 Radiation Therapy    Neoadjuvant radiation to esophageal cancer  Under the care of Dr. [name]    10/31/2016 - 12/07/2016 Neo-Adjuvant Chemotherapy    Weekly Carboplatin AUC 2 and taxol 50mg /m2 with concurrent radiation      12/14/2016 Imaging    CT AP W Contrast 12/14/16 IMPRESSION: 1. Mildly dilated short segment of proximal jejunum without bowel wall thickening or significant inflammatory changes. Findings may represent focal ileus or radiation enteritis. No evidence for obstruction. 2. Significantly improved appearance of the distal esophagus/proximal stomach with decreased wall thickening (1.1 cm from 2.3 cm), and smaller paraesophageal and gastrohepatic ligament lymph nodes. 3. No bowel obstruction.  Normal appendix.    01/18/2017 Imaging    CT CAP W Contrast 01/18/17 IMPRESSION: 1. Mild stable distal esophageal wall thickening likely due to radiation change. No findings for recurrent tumor. Small paraesophageal lymph nodes are also stable. 2. No findings for metastatic disease. 3. Stable mild/early emphysematous changes and age advanced atherosclerotic calcifications involving the thoracic and abdominal aorta and branch vessels.    04/12/2017 Imaging    CT Chest and Abdomen W Contrast 04/12/17  IMPRESSION: 1. Mild stable distal esophageal wall thickening. No findings for recurrent tumor. 2. Stable small mediastinal and left supraclavicular lymph nodes. 3. There is a tiny nodule within the medial  right lower lobe measuring 3 mm. New from previous exam. Nonspecific. Attention on follow-up imaging advise. 4.  Aortic  Atherosclerosis (ICD10-I70.0). 5. Three vessel coronary artery calcification    04/16/2017 Surgery    VIDEO BRONCHOSCOPY, Transhiatal Total Esophagectomy, Esophagogastrostomy, pyloromotomy, Feeding Jejunostomy ESOPHAGECTOMY COMPLETE,Transhiatal total esophagectomy JEJUNOSTOMY,Feeding by Dr. [name] 04/16/17    04/16/2017 Pathology Results    Diagnosis 04/16/17  1. Omentum, resection for tumor - BENIGN ADIPOSE TISSUE CONSISTENT WITH OMENTUM. - NO EVIDENCE OF MALIGNANCY. 2. Esophagus, resection, w/ GE junction - FIBROSIS WITH PATCHY CHRONIC INFLAMMATION. - NO RESIDUAL CARCINOMA IDENTIFIED. - MARGINS NOT INVOLVED. - TWELVE LYMPH NODES WITH NO METASTATIC CARCINOMA (0/12).     07/16/2017 Procedure    Upper GI Endoscopy Findings: per Dr. [name] - Food was found in the upper third of the esophagus. Removal of food was accomplished with Jabier Mutton net. - A partial esophagectomy anastomosis was found in the upper third of the esophagus (20 cm from incisors). This was characterized by severe stenosis, an intact staple line and visible sutures. The standard adult upper endoscope would not pass before dilation. A TTS dilator was passed through the scope. Dilation with an 8.5-9.5-10.5 mm and then 06-11-12 mm balloon dilator was performed to 12 mm (inspection after 9.5 mm, 10.5 mm, 11 mm and 12 mm). The dilation site was examined and showed moderate and significant improvement in luminal narrowing. Estimated blood loss was minimal. After dilation to 12 mm the endoscope was able to transverse the anastomosis with minimal pressure. - A medium amount of food (residue) was found in the gastric body. - The exam of the stomach was otherwise normal. - The examined duodenum was normal.    08/13/2017 Procedure    Upper GI Endoscopy Findings: per Dr. [name] - Food was found in the proximal esophagus. Removal of food was accomplished with Jabier Mutton net. - One severe (stenosis; an endoscope cannot pass) benign-appearing,  intrinsic stenosis was found 20 cm from the incisors. This measured 1 cm (in length) and was traversed after dilation. A TTS dilator was passed through the scope. Dilation with an 06-11-12 mm balloon dilator was performed to 13 mm (after 78mm and 12 mm). The dilation site was examined and showed moderate improvement in luminal narrowing. At the stricture there is a visible staple on the proximal side and visible suture material on the gastric side. - A medium amount of food (residue) -the duodenum was normal     08/31/2017 Procedure    Endoscopic needle-knife excision of anastomotic stricture by Dr. Lysle Rubens at [facility]    09/11/2017 Imaging    CT CAP W CONTRAST IMPRESSION: 1. Interval esophagectomy with gastric pull-through. No demonstrated complication. There is retained ingested material within the intrathoracic portion of the stomach. 2. No evidence of local recurrence or metastatic disease. 3. Stable small left supraclavicular and superior mediastinal lymph nodes. 4. Mild lower lobe paramediastinal pulmonary opacity bilaterally, likely atelectasis or sequela of prior radiation therapy. 5.  Aortic Atherosclerosis (ICD10-I70.0).     02/28/2018 Procedure    02/28/2018 Upper endoscopy Impression - Benign-appearing esophageal stenosis. Dilated. - A previous surgical anastomosis was found in the proximal stomach. - Normal examined duodenum. - No specimens collected.    04/10/2018 Imaging    04/10/2018 CT CA IMPRESSION: 1. No evidence of metastatic disease. 2. Aortic atherosclerosis (ICD10-170.0). Coronary artery calcification. 3.  Emphysema (ICD10-J43.9).    HISTORY OF PRESENTING ILLNESS (10/16/2016):  Darrell Cunningham 57 y.o. male is here because of an esophageal mass that is  suspicious for adenocarcinoma. The pt presented to the ED on 09/21/2016 for esophageal pain and vomiting up blood. He had been struggling with regurgitating food or drinks, difficulty swallowing, and throat pain for 3 weeks  prior. He was referred by his PCP to GI who ordered him an EGD with dilation with Dr. [name] as soon as possible. The EGD and biopsy was performed on 10/09/2016, which showed suspicion of adenocarcinoma of the esophagus and a CT scan was ordered. The CT results demonstrated a distal esophageal mass compatible with primary esophageal malignancy. He is here to discuss treatment.   When he has trouble swallowing, he has to stand up and push his head back to get the food down. He has mostly eating oatmeal, Ensure, and other soft foods. He drinks 2-3 Ensure a day. He has cut out coffee and acidic foods to try to relieve his pain. It is not getting better, though. He isn't able to eat much due to this pain and difficulty swallowing. He does not take any pain medication. He has lost about 13 pounds. He also has some left sided abdominal pain. At night, he has a lot of acid reflux and sputum production. Denies constipation, diarrhea, nausea, SOB, or any other concerns.   He has a history of cerebral palsy on his right side. He has some back pain due to a curvature in his spine. History of asthma. His grandmother had breast cancer and colon cancer. His sister passed of breast cancer at age 9. His aunt had ovarian cancer. No other family history of cancer. He quit smoking 4 years ago. 38 pack years, 1-1.5 ppd. He lives at home with his 60 y.o. Daughter. His parents live about 15 minutes away.   CURRENT THERAPY: Surveillance  INTERVAL HISTORY:    The patient returns for follow up for his esophogeal cancer. He had an upper endoscopy on 02/28/2018 that showed no evidence of recurrence, he has been doing esophageal stricture dilatation monthly by Dr. [name]. He is here today alone at the clinic. He is doing well and gaining weight.  He denies any significant pain, cough, dyspnea, or other symptoms.  He occasionally still has dysphasia, with food being stuck in the middle of chest, likely related to his stricture.  He is  happy about his recent CT scan that were benign.    MEDICAL HISTORY:  Past Medical History:  Diagnosis Date  . Allergy   . Arthritis   . Asthma    as a child  . Cancer (HCC)    ESOPHAGUS CARCINOMA   . COPD (chronic obstructive pulmonary disease) ([place])   . CP (cerebral palsy), spastic ([place])    right  . Dysphagia   . Emphysema of lung ([place])   . Encounter for nonprocreative genetic counseling 10/31/2016   Darrell Cunningham underwent genetic counseling for hereditary cancer syndromes on 10/31/2016. Though he is a candidate for genetic testing, he declines at this time.  Marland Kitchen GERD (gastroesophageal reflux disease)   . Hypertension   . Neuromuscular disorder ([place])   . Pneumonia 4 yrs ago    SURGICAL HISTORY: Past Surgical History:  Procedure Laterality Date  . COLONOSCOPY    . COMPLETE ESOPHAGECTOMY N/A 04/16/2017   Procedure: ESOPHAGECTOMY COMPLETE,Transhiatal total esophagectomy;  Surgeon: [name], MD;  Location: [facility] OR;  Service: Thoracic;  Laterality: N/A;  . EUS N/A 10/26/2016   Procedure: UPPER ENDOSCOPIC ULTRASOUND (EUS) LINEAR;  Surgeon: [name], MD;  Location: [facility] ENDOSCOPY;  Service: Endoscopy;  Laterality: N/A;  . EYE SURGERY Bilateral age 62    for cross eyes  . IR FLUORO GUIDE PORT INSERTION RIGHT  11/08/2016  . IR US GUIDE VASC ACCESS RIGHT  11/08/2016  . JEJUNOSTOMY N/A 04/16/2017   Procedure: Donney Rankins;  Surgeon: [name], MD;  Location: [place];  Service: Thoracic;  Laterality: N/A;  . MOUTH SURGERY    . UPPER GASTROINTESTINAL ENDOSCOPY    . VIDEO BRONCHOSCOPY N/A 04/16/2017   Procedure: VIDEO BRONCHOSCOPY, Transhiatal Total Esophagectomy, Esophagogastrostomy, pyloromotomy, Feeding Jejunostomy;  Surgeon: [name], MD;  Location: [facility] OR;  Service: Thoracic;  Laterality: N/A;    SOCIAL HISTORY: Social History   Socioeconomic History  . Marital status: Legally Separated    Spouse name: Not on file  . Number of children: Not on  file  . Years of education: Not on file  . Highest education level: Not on file  Occupational History  . Not on file  Social Needs  . Financial resource strain: Not on file  . Food insecurity:    Worry: Not on file    Inability: Not on file  . Transportation needs:    Medical: Not on file    Non-medical: Not on file  Tobacco Use  . Smoking status: Former Smoker    Packs/day: 1.00    Years: 38.00    Pack years: 38.00    Types: Cigarettes    Start date: 10/31/2012    Last attempt to quit: 10/31/2012    Years since quitting: 5.4  . Smokeless tobacco: Never Used  Substance and Sexual Activity  . Alcohol use: No    Alcohol/week: 0.0 standard drinks  . Drug use: No  . Sexual activity: Not on file  Lifestyle  . Physical activity:    Days per week: Not on file    Minutes per session: Not on file  . Stress: Not on file  Relationships  . Social connections:    Talks on phone: Not on file    Gets together: Not on file    Attends religious service: Not on file    Active member of club or organization: Not on file    Attends meetings of clubs or organizations: Not on file    Relationship status: Not on file  . Intimate partner violence:    Fear of current or ex partner: Not on file    Emotionally abused: Not on file    Physically abused: Not on file    Forced sexual activity: Not on file  Other Topics Concern  . Not on file  Social History Narrative  . Not on file    FAMILY HISTORY: Family History  Problem Relation Age of Onset  . Colon cancer Maternal Grandmother 15  . Breast cancer Maternal Grandmother 30  . Breast cancer Sister 82       Deceased at 72 of breast cancer  . Ovarian cancer Maternal Aunt   . COPD Father   . Stomach cancer Neg Hx   . Esophageal cancer Neg Hx   . Colon polyps Neg Hx   . Rectal cancer Neg Hx     ALLERGIES:  is allergic to penicillins.  MEDICATIONS:  Current Outpatient Medications  Medication Sig Dispense Refill  . acetaminophen  (TYLENOL) 160 MG/5ML solution Take 10.2 mLs (325 mg total) by mouth every 4 (four) hours as needed for moderate pain. 120 mL 0  . albuterol (ACCUNEB) 0.63 MG/3ML nebulizer solution Take 1 ampule by nebulization every 6 (  six) hours as needed for wheezing.    Marland Kitchen CARAFATE 1 GM/10ML suspension TAKE 10 MLS BY MOUTH 4 TIMES A DAY BEFORE MEALS AND AT BEDTIME 1200 mL 2  . cyclobenzaprine (FLEXERIL) 5 MG tablet TAKE 1 TABLET BY MOUTH THREE TIMES A DAY AS NEEDED FOR MUSCLE SPASMS 30 tablet 0  . Electrolyte-S (ISOLYTE-S IV) Inject into the vein. Drinks orally use to use through feeding tube.    . fluticasone (FLONASE) 50 MCG/ACT nasal spray Place 1 spray into both nostrils daily as needed for allergies or rhinitis.    . fluticasone (FLOVENT DISKUS) 50 MCG/BLIST diskus inhaler Inhale into the lungs.    . lactose free nutrition (BOOST) LIQD Take 237 mLs 2 (two) times daily between meals by mouth.    . lidocaine-prilocaine (EMLA) cream APPLY TO PORT-A-CATH SITE 1.5 TO 2 HOURS PRIOR TO PROCEDURE AS NEEDED 30 g 1  . pantoprazole (PROTONIX) 40 MG tablet TAKE 1 TABLET BY MOUTH TWICE A DAY 60 tablet 11   No current facility-administered medications for this visit.    REVIEW OF SYSTEMS:  Constitutional: Denies fevers, chills or abnormal night sweats (+) stable weight  Eyes: Denies blurriness of vision, double vision or watery eyes Ears, nose, mouth, throat, and face: Denies mucositis or sore throat  (+) improvement in swallowing solids Respiratory: Denies dyspnea or wheezes  Cardiovascular: Denies palpitation, chest discomfort or lower extremity swelling Gastrointestinal:  Denies nausea, heartburn or change in bowel habits   Skin: Denies abnormal skin rashes  Lymphatics: Denies new lymphadenopathy or easy bruising Neurological:Denies numbness, tingling or new weaknesses  MSK: No new weakness or myalgia  Musculoskeletal: Negative Behavioral/Psych: Mood is stable, no new changes  All other systems were reviewed  with the patient and are negative.  PHYSICAL EXAMINATION:  ECOG PERFORMANCE STATUS: 0  Vitals:   04/12/18 0931  BP: 121/76  Pulse: 72  Resp: 18  Temp: 98.5 F (36.9 C)  SpO2: 100%   Filed Weights   04/12/18 0931  Weight: 129 lb 14.4 oz (58.9 kg)    GENERAL:alert, no distress and comfortable (+) limp from right sided cerebral palsy SKIN: skin color, texture, turgor are normal, no rashes or significant lesions EYES: normal, conjunctiva are pink and non-injected, sclera clear OROPHARYNX:no exudate, no erythema and lips, buccal mucosa, and tongue normal  NECK: supple, thyroid normal size, non-tender, without nodularity LYMPH:  no palpable lymphadenopathy in the cervical, axillary or inguinal LUNGS: clear to auscultation and percussion with normal breathing effort  HEART: regular rate & rhythm and no murmurs and no lower extremity edema ABDOMEN: (+) Midline surgical incision healed very well, mild scar tissue Musculoskeletal:no cyanosis of digits and no clubbing  PSYCH: alert & oriented x 3 with fluent speech NEURO: no focal motor/sensory deficits  LABORATORY DATA:  I have reviewed the data as listed CBC Latest Ref Rng & Units 04/10/2018 01/11/2018 09/14/2017  WBC 4.0 - 10.3 K/uL 5.0 3.5(L) 5.5  Hemoglobin 13.0 - 17.1 g/dL 13.1 12.5(L) 12.1(L)  Hematocrit 38.4 - 49.9 % 39.1 37.3(L) 36.3(L)  Platelets 140 - 400 K/uL 153 150 204   CMP Latest Ref Rng & Units 04/10/2018 01/11/2018 09/14/2017  Glucose 70 - 99 mg/dL 96 103 115  BUN 6 - 20 mg/dL 14 15 16   Creatinine 0.61 - 1.24 mg/dL 0.69 0.65(L) 0.63(L)  Sodium 135 - 145 mmol/L 142 140 141  Potassium 3.5 - 5.1 mmol/L 4.0 3.9 3.9  Chloride 98 - 111 mmol/L 108 107 108  CO2 22 - 32  mmol/L 26 25 24   Calcium 8.9 - 10.3 mg/dL 9.1 9.2 9.4  Total Protein 6.5 - 8.1 g/dL 6.8 6.5 6.5  Total Bilirubin 0.3 - 1.2 mg/dL 0.3 0.4 0.2  Alkaline Phos 38 - 126 U/L 123 144 136  AST 15 - 41 U/L 18 30 29   ALT 0 - 44 U/L 17 57(H) 31    Tumor  Marker Results for Darrell Cunningham, Darrell Cunningham (MRN [id number]) as of 04/10/2018 17:16  Ref. Range 03/14/2017 08:09 06/14/2017 08:40 09/14/2017 10:00 01/11/2018 09:18 04/10/2018 08:55  CEA ([facility]) Latest Ref Range: 0.00 - 5.00 ng/mL 1.14 1.51 1.04 1.44 1.58   Procedures  02/28/2018 Upper endoscopy Findings - One benign-appearing, intrinsic severe (stenosis; an endoscope cannot pass) stenosis was found 20 cm from the incisors. This stenosis measured 8 mm (inner diameter) x 1 cm (in length). The stenosis was traversed after dilation. A TTS dilator was passed through the scope. Dilation with a 13.5-14.5-15.5 mm balloon dilator was performed to 15.5 mm. The dilation site was examined and showed moderate mucosal disruption and complete resolution of luminal narrowing. Estimated blood loss was minimal. - Evidence of a previous surgical anastomosis was found in the gastric body (esophagectomy with pull-through). - The exam of the stomach was otherwise normal. - The examined duodenum was normal. Impression - Benign-appearing esophageal stenosis. Dilated. - A previous surgical anastomosis was found in the proximal stomach. - Normal examined duodenum. - No specimens collected.   PATHOLOGY:  Diagnosis 04/16/17  1. Omentum, resection for tumor - BENIGN ADIPOSE TISSUE CONSISTENT WITH OMENTUM. - NO EVIDENCE OF MALIGNANCY. 2. Esophagus, resection, w/ GE junction - FIBROSIS WITH PATCHY CHRONIC INFLAMMATION. - NO RESIDUAL CARCINOMA IDENTIFIED. - MARGINS NOT INVOLVED. - TWELVE LYMPH NODES WITH NO METASTATIC CARCINOMA (0/12). Microscopic Comment 2. ESOPHAGUS: Specimen: Esophagus with GE junction. Procedure: Esophagogastrectomy. Tumor Site: No residual tumor identified in resection specimen. Relationship of Tumor to esophagogastric junction: Area of fibrosis located at esophagogastric junction. Distance of tumor center from esophagogastric junction: N/A. Tumor Size Greatest dimension: N/A. Histologic  Type: Invasive adenocarcinoma, see previous esophagogastric junction biopsy ([id number]). Histologic Grade: See [id number]. Microscopic Tumor Extension: No residual carcinoma in resection specimen. Margins: Free of tumor. Proximal Margin: Free of tumor. Distal Margin: Free of tumor. Circumferential (Adventitial) Margin: Free of tumor. Deep Margin (endoscopic resection specimens): Free of tumor. If margins uninvolved - Distance of invasive carcinoma from closest margin: N/A. Specify margin: N/A. Treatment Effect: Present. Lymph-Vascular Invasion: Not identified. Perineural Invasion: Not identified. Lymph nodes: number examined 12; number positive: 0, see comment. TNM: ypT0, ypN0. Ancillary studies: N/A. 1 of 3 FINAL for Darrell Cunningham, Darrell Cunningham [phone]) Microscopic Comment(continued) Comments: There is a 3 cm area at the gastroesophageal junction which shows fibrosis with hyperemia and patchy chronic inflammation. No residual carcinoma is identified. There are numerous foamy histiocytes in areas of fibrosis with occasional giant cells in six of the twelve lymph nodes. No viable metastatic carcinoma is identified in any of the twelve lymph nodes. (JDP:ah 04/18/17)  Diagnosis 10/26/2016 Esophagogastric junction, biopsy, mass - INVASIVE ADENOCARCINOMA.  Diagnosis 10/09/2016 Esophagus, biopsy, distal esophageal tumor (33-39) - SUSPICIOUS FOR ADENOCARCINOMA, SEE COMMENT.Marland Kitchen    RADIOGRAPHIC STUDIES: I have personally reviewed the radiological images as listed and agreed with the findings in the report.   04/10/2018 CT CA IMPRESSION: 1. No evidence of metastatic disease. 2. Aortic atherosclerosis (ICD10-170.0). Coronary artery calcification. 3.  Emphysema (ICD10-J43.9).  CT CAP W Contrast 09/11/17  IMPRESSION: 1. Interval esophagectomy with gastric pull-through. No demonstrated complication.  There is retained ingested material within the intrathoracic portion of the stomach. 2. No  evidence of local recurrence or metastatic disease. 3. Stable small left supraclavicular and superior mediastinal lymph nodes. 4. Mild lower lobe paramediastinal pulmonary opacity bilaterally, likely atelectasis or sequela of prior radiation therapy. 5.  Aortic Atherosclerosis (ICD10-I70.0).   CT Chest and Abdomen W Contrast 04/12/17  IMPRESSION: 1. Mild stable distal esophageal wall thickening. No findings for recurrent tumor. 2. Stable small mediastinal and left supraclavicular lymph nodes. 3. There is a tiny nodule within the medial right lower lobe measuring 3 mm. New from previous exam. Nonspecific. Attention on follow-up imaging advise. 4.  Aortic Atherosclerosis (ICD10-I70.0). 5. Three vessel coronary artery calcification   CT CAP W Contrast 01/18/17 IMPRESSION: 1. Mild stable distal esophageal wall thickening likely due to radiation change. No findings for recurrent tumor. Small paraesophageal lymph nodes are also stable. 2. No findings for metastatic disease. 3. Stable mild/early emphysematous changes and age advanced atherosclerotic calcifications involving the thoracic and abdominal aorta and branch vessels.   CT AP W Contrast 12/14/16 IMPRESSION: 1. Mildly dilated short segment of proximal jejunum without bowel wall thickening or significant inflammatory changes. Findings may represent focal ileus or radiation enteritis. No evidence for obstruction. 2. Significantly improved appearance of the distal esophagus/proximal stomach with decreased wall thickening (1.1 cm from 2.3 cm), and smaller paraesophageal and gastrohepatic ligament lymph nodes. 3. No bowel obstruction.  Normal appendix.  PET 10/24/2016 IMPRESSION: 1. Markedly hypermetabolic distal esophageal lesion, compatible with malignancy. Adjacent small paraesophageal lymph nodes are abnormal by CT but cannot be resolved as separate structures from the hypermetabolic esophageal activity on the PET images.  No hypermetabolism is demonstrated in the upper abdominal/gastrohepatic ligament lymph node although the small size of this lymph node may be below threshold for detection on PET imaging. 2. No evidence for distant hypermetabolic metastatic disease in the neck, chest, abdomen, or pelvis.  CT CAP w Contrast 10/12/2016 IMPRESSION: Distal esophageal mass compatible with primary esophageal malignancy. There are 2 adjacent abnormal appearing subcentimeter paraesophageal lymph nodes which may represent nodal metastasis. Additionally there is a prominent nonspecific 8 mm upper abdominal lymph node.  No evidence for distant metastatic disease in the chest, abdomen or pelvis.  EGD 10/09/2016   ASSESSMENT & PLAN:  Darrell Cunningham is a 57 y.o. Caucasian male with PMH cerebral palsy and heavy smoking, who presents with dysphagia, odynophagia and weight loss   1. Low esophageal Adenocarcinoma, cT3N1M0, stage III, ypT0N0  -I previously reviewed the imaging and biopsy pathology results with the patient in detail. Although his biopsy pathology was not definitive for malignancy, based on his endoscopy findings, this is most consistent with esophageal adenocarcinoma. -I previously reviewed his PET scan images with patient in person, which showed intense hypermetabolic esophageal cancer, no other distant metastasis -I previously reviewed the EUS and re-biopsy results, which showed T3N1 disease, and biopsy confirmed adenocarcinoma -We previously reviewed the standard care for locally advanced esophageal cancer, including neoadjuvant chemoradiation, followed by esophagectomy.  -He was previously referred to see thoracic surgeon Dr. Pia Mau. -He has completed neoadjuvant concurrent chemoradiation with weekly carbo and Taxol, tolerated well overall  -His dysphagia has significantly improved, odynophagia has resolved.  -12/14/16 CT abdomen scan reviewed and showed good response in shrinking of tumor.  -He underwent  surgery with Dr. [name] on 04/16/17. He had complete response to chemo, radiation, no residual cancers on the surgical samples.  Which is a good prognostic factor.  -This does not rule out  microscopic disease and the risk of recurrence.  I recommended cancer surveillance for 5 years. -His surveillance CT from 09/11/17 was previously reviewed and showed esophogeal surgical changes, no evidence of local recurrence or metastatic disease. There is small left supraclavicular and superior mediastinal lymph nodes and concern for atelectasis or sequela of prior radiation therapy. This has resolved on subsequent CT scan. -He is clinically doing well, his eating, weight and energy have been recovering well post treatment. Physical exam unremarkable.  -CT scan on 04/10/2018 was negative.  I have personally reviewed the image, agree with the radiology interpretation.  I discussed the findings with the patient.  He is quite pleased. -Labs reviewed, CBC WNLs. CMP showed albumin 3.3. I advised him to increase his protein intake.  -F/u in 6 months, he will see Dr. [name] in 2 months -Repeat scan next year   2. History of heavy smoking -He has quit smoking completely, history of 40 pack year.    3.  Malnutrition and weight loss  -His nutrician and eating has much improved. He has had an esophogeal stretch and manages eating well with the help of Sulcrafate.  -His weight has been stable recently  5.  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 [name] for dilatation  Plan: -F/u in 6 months -Scan next year  All questions were answered. The patient knows to call the clinic with any problems, questions or concerns.  I spent 20 minutes counseling the patient face to face. The total time spent in the appointment was 25 minutes and more than 50% was on counseling.  Dierdre Searles Dweik am acting as scribe for Dr. [name].  I have reviewed the above documentation for  accuracy and completeness, and I agree with the above.     [name], MD 04/12/2018

## 2018-04-12 ENCOUNTER — Inpatient Hospital Stay (HOSPITAL_BASED_OUTPATIENT_CLINIC_OR_DEPARTMENT_OTHER): Payer: Medicaid Other | Admitting: Hematology

## 2018-04-12 ENCOUNTER — Encounter: Payer: Self-pay | Admitting: Hematology

## 2018-04-12 ENCOUNTER — Telehealth: Payer: Self-pay | Admitting: Hematology

## 2018-04-12 VITALS — BP 121/76 | HR 72 | Temp 98.5°F | Resp 18 | Ht 60.0 in | Wt 129.9 lb

## 2018-04-12 DIAGNOSIS — C155 Malignant neoplasm of lower third of esophagus: Secondary | ICD-10-CM

## 2018-04-12 DIAGNOSIS — E46 Unspecified protein-calorie malnutrition: Secondary | ICD-10-CM

## 2018-04-12 DIAGNOSIS — Z800 Family history of malignant neoplasm of digestive organs: Secondary | ICD-10-CM

## 2018-04-12 DIAGNOSIS — Z87891 Personal history of nicotine dependence: Secondary | ICD-10-CM

## 2018-04-12 DIAGNOSIS — Z79899 Other long term (current) drug therapy: Secondary | ICD-10-CM

## 2018-04-12 DIAGNOSIS — K222 Esophageal obstruction: Secondary | ICD-10-CM | POA: Diagnosis not present

## 2018-04-12 DIAGNOSIS — Z923 Personal history of irradiation: Secondary | ICD-10-CM

## 2018-04-12 DIAGNOSIS — G809 Cerebral palsy, unspecified: Secondary | ICD-10-CM

## 2018-04-12 NOTE — Telephone Encounter (Signed)
Scheduled appt per 9/13 los - sent reminder letter in the mail - lab and f/u in 6 months.

## 2018-04-15 ENCOUNTER — Encounter: Payer: Medicaid Other | Admitting: Internal Medicine

## 2018-04-15 ENCOUNTER — Ambulatory Visit (AMBULATORY_SURGERY_CENTER): Payer: Medicaid Other | Admitting: Internal Medicine

## 2018-04-15 ENCOUNTER — Encounter: Payer: Self-pay | Admitting: Internal Medicine

## 2018-04-15 VITALS — BP 95/65 | HR 91 | Temp 98.6°F | Resp 16 | Ht 60.0 in | Wt 127.0 lb

## 2018-04-15 DIAGNOSIS — K222 Esophageal obstruction: Secondary | ICD-10-CM | POA: Diagnosis not present

## 2018-04-15 DIAGNOSIS — R1310 Dysphagia, unspecified: Secondary | ICD-10-CM | POA: Diagnosis not present

## 2018-04-15 DIAGNOSIS — K449 Diaphragmatic hernia without obstruction or gangrene: Secondary | ICD-10-CM | POA: Diagnosis not present

## 2018-04-15 MED ORDER — SODIUM CHLORIDE 0.9 % IV SOLN: 500.0000 mL | Freq: Once | INTRAVENOUS | Status: DC

## 2018-04-15 NOTE — Progress Notes (Signed)
Called to room to assist during endoscopic procedure.  Patient ID and intended procedure confirmed with present staff. Received instructions for my participation in the procedure from the performing physician.  

## 2018-04-15 NOTE — Progress Notes (Signed)
Pt. Reports  No change in his medical or surgical history since his pre-visit 04/05/2018.

## 2018-04-15 NOTE — Progress Notes (Signed)
Report given to PACU, vss 

## 2018-04-15 NOTE — Op Note (Signed)
[place] Patient Name: Darrell Cunningham Procedure Date: 04/15/2018 10:43 AM MRN: [id number] Endoscopist: [name] , MD Age: 57 Referring MD:  Date of Birth: 1961/02/01 Gender: Male Account #: [id number] Procedure:                Upper GI endoscopy Indications:              Dysphagia, For therapy of post-surgical anastomotic                            stenosis; history of esophageal cancer status post                            esophagectomy with stenosis at esophagogastric                            junction; last dilated to 15.5 mm with balloon 6                            weeks ago Medicines:                Monitored Anesthesia Care Procedure:                Pre-Anesthesia Assessment:                           - Prior to the procedure, a History and Physical                            was performed, and patient medications and                            allergies were reviewed. The patient's tolerance of                            previous anesthesia was also reviewed. The risks                            and benefits of the procedure and the sedation                            options and risks were discussed with the patient.                            All questions were answered, and informed consent                            was obtained. Prior Anticoagulants: The patient has                            taken no previous anticoagulant or antiplatelet                            agents. ASA Grade Assessment: III - A patient with  severe systemic disease. After reviewing the risks                            and benefits, the patient was deemed in                            satisfactory condition to undergo the procedure.                           After obtaining informed consent, the endoscope was                            passed under direct vision. Throughout the                            procedure, the patient's blood pressure, pulse,  and                            oxygen saturations were monitored continuously. The                            Endoscope was introduced through the mouth, and                            advanced to the second part of duodenum. The upper                            GI endoscopy was accomplished without difficulty.                            The patient tolerated the procedure well. Scope In: Scope Out: Findings:                 An esophagogastric anastomosis was found in the                            upper third of the esophagus at 20 cm from the                            incisors. This was characterized by severe                            stenosis, and prior to dilation the scope would not                            pass. A TTS dilator was passed through the scope.                            Dilation with a 13.5-14.5-15.5 mm balloon and a                            16-17-18 mm balloon dilator was performed to  maximum size of 17 mm (initial dilation to 15.5 mm                            revealed only mild mucosal disruption; subsequent                            dilation to 16 mm and 17 mm with inspection between                            each dilation). The dilation site was examined and                            showed moderate mucosal disruption and improved                            luminal narrowing. Estimated blood loss was minimal.                           Evidence of a previous surgical anastomosis was                            found in the stomach (esophagectomy with gastric                            pull-through). This was characterized by visible                            sutures.                           The exam of the stomach was otherwise normal.                           The examined duodenum was normal. Complications:            No immediate complications. Estimated Blood Loss:     Estimated blood loss was minimal. Impression:                - An esophagogastric anastomosis was found,                            characterized by severe stenosis. Dilated to 17 mm                            with balloon.                           - Prior gastric surgery with otherwise normal                            stomach                           - Normal examined duodenum.                           -  No specimens collected. Recommendation:           - Patient has a contact number available for                            emergencies. The signs and symptoms of potential                            delayed complications were discussed with the                            patient. Return to normal activities tomorrow.                            Written discharge instructions were provided to the                            patient.                           - Post esophageal dilation diet, clear liquids x 2                            hours, slowly advance as tolerated per instruction.                           - Continue present medications.                           - Repeat upper endoscopy in 6 weeks for                            retreatment. This can be delayed if symptoms at                            that time do not warrant repeat dilation. [name], MD 04/15/2018 11:12:14 AM This report has been signed electronically.

## 2018-04-15 NOTE — Patient Instructions (Signed)
**   Post Dilation diet provided **   YOU HAD AN ENDOSCOPIC PROCEDURE TODAY AT THE [facility]:   Refer to the procedure report that was given to you for any specific questions about what was found during the examination.  If the procedure report does not answer your questions, please call your gastroenterologist to clarify.  If you requested that your care partner not be given the details of your procedure findings, then the procedure report has been included in a sealed envelope for you to review at your convenience later.  YOU SHOULD EXPECT: Some feelings of bloating in the abdomen. Passage of more gas than usual.  Walking can help get rid of the air that was put into your GI tract during the procedure and reduce the bloating. If you had a lower endoscopy (such as a colonoscopy or flexible sigmoidoscopy) you may notice spotting of blood in your stool or on the toilet paper. If you underwent a bowel prep for your procedure, you may not have a normal bowel movement for a few days.  Please Note:  You might notice some irritation and congestion in your nose or some drainage.  This is from the oxygen used during your procedure.  There is no need for concern and it should clear up in a day or so.  SYMPTOMS TO REPORT IMMEDIATELY:   Following upper endoscopy (EGD)  Vomiting of blood or coffee ground material  New chest pain or pain under the shoulder blades  Painful or persistently difficult swallowing  New shortness of breath  Fever of 100F or higher  Black, tarry-looking stools  For urgent or emergent issues, a gastroenterologist can be reached at any hour by calling [phone].   DIET:  We do recommend a small meal at first, but then you may proceed to your regular diet.  Drink plenty of fluids but you should avoid alcoholic beverages for 24 hours.  ACTIVITY:  You should plan to take it easy for the rest of today and you should NOT DRIVE or use heavy machinery until tomorrow  (because of the sedation medicines used during the test).    FOLLOW UP: Our staff will call the number listed on your records the next business day following your procedure to check on you and address any questions or concerns that you may have regarding the information given to you following your procedure. If we do not reach you, we will leave a message.  However, if you are feeling well and you are not experiencing any problems, there is no need to return our call.  We will assume that you have returned to your regular daily activities without incident.  If any biopsies were taken you will be contacted by phone or by letter within the next 1-3 weeks.  Please call us at [phone] if you have not heard about the biopsies in 3 weeks.    SIGNATURES/CONFIDENTIALITY: You and/or your care partner have signed paperwork which will be entered into your electronic medical record.  These signatures attest to the fact that that the information above on your After Visit Summary has been reviewed and is understood.  Full responsibility of the confidentiality of this discharge information lies with you and/or your care-partner.

## 2018-04-16 ENCOUNTER — Telehealth: Payer: Self-pay

## 2018-04-16 NOTE — Telephone Encounter (Signed)
  Follow up Call-  Call back number 04/15/2018 02/28/2018 01/21/2018 01/01/2018 10/25/2017 10/03/2017 08/13/2017  Post procedure Call Back phone  # [phone]  Permission to leave phone message Yes Yes Yes - Yes Yes Yes  Some recent data might be hidden     Patient questions:  Do you have a fever, pain , or abdominal swelling? No. Pain Score  0 *  Have you tolerated food without any problems? Yes.    Have you been able to return to your normal activities? Yes.    Do you have any questions about your discharge instructions: Diet   No. Medications  No. Follow up visit  No.  Do you have questions or concerns about your Care? No.  Actions: * If pain score is 4 or above: No action needed, pain <4.

## 2018-04-22 ENCOUNTER — Telehealth: Payer: Self-pay | Admitting: *Deleted

## 2018-04-22 NOTE — Telephone Encounter (Signed)
I have left a voicemail for patient to call back. He has been scheduled for endoscopy with savary dilation at [facility] on 06/03/18 at 1130 am. Previsit is scheduled for 05/21/18 at 1100 am.

## 2018-04-22 NOTE — Telephone Encounter (Signed)
Patient agrees to endoscopy on 06/03/18 as well as previsit on 05/21/18.

## 2018-05-03 ENCOUNTER — Telehealth: Payer: Self-pay | Admitting: Hematology

## 2018-05-03 NOTE — Telephone Encounter (Signed)
Scheduled appt per 10/4 sch message - pt is aware of apt date and time

## 2018-05-13 ENCOUNTER — Inpatient Hospital Stay: Payer: Medicaid Other | Attending: Hematology

## 2018-05-13 DIAGNOSIS — Z95828 Presence of other vascular implants and grafts: Secondary | ICD-10-CM

## 2018-05-13 DIAGNOSIS — C155 Malignant neoplasm of lower third of esophagus: Secondary | ICD-10-CM | POA: Diagnosis present

## 2018-05-13 DIAGNOSIS — Z452 Encounter for adjustment and management of vascular access device: Secondary | ICD-10-CM | POA: Insufficient documentation

## 2018-05-13 MED ORDER — HEPARIN SOD (PORK) LOCK FLUSH 100 UNIT/ML IV SOLN
500.0000 [IU] | Freq: Once | INTRAVENOUS | Status: AC
  Administered 2018-05-13: 500 [IU]
  Filled 2018-05-13: qty 5

## 2018-05-13 MED ORDER — SODIUM CHLORIDE 0.9% FLUSH
10.0000 mL | Freq: Once | INTRAVENOUS | Status: AC
  Administered 2018-05-13: 10 mL
  Filled 2018-05-13: qty 10

## 2018-05-13 NOTE — Patient Instructions (Signed)
Implanted Port Home Guide An implanted port is a type of central line that is placed under the skin. Central lines are used to provide IV access when treatment or nutrition needs to be given through a person's veins. Implanted ports are used for long-term IV access. An implanted port may be placed because:  You need IV medicine that would be irritating to the small veins in your hands or arms.  You need long-term IV medicines, such as antibiotics.  You need IV nutrition for a long period.  You need frequent blood draws for lab tests.  You need dialysis.  Implanted ports are usually placed in the chest area, but they can also be placed in the upper arm, the abdomen, or the leg. An implanted port has two main parts:  Reservoir. The reservoir is round and will appear as a small, raised area under your skin. The reservoir is the part where a needle is inserted to give medicines or draw blood.  Catheter. The catheter is a thin, flexible tube that extends from the reservoir. The catheter is placed into a large vein. Medicine that is inserted into the reservoir goes into the catheter and then into the vein.  How will I care for my incision site? Do not get the incision site wet. Bathe or shower as directed by your health care provider. How is my port accessed? Special steps must be taken to access the port:  Before the port is accessed, a numbing cream can be placed on the skin. This helps numb the skin over the port site.  Your health care provider uses a sterile technique to access the port. ? Your health care provider must put on a mask and sterile gloves. ? The skin over your port is cleaned carefully with an antiseptic and allowed to dry. ? The port is gently pinched between sterile gloves, and a needle is inserted into the port.  Only "non-coring" port needles should be used to access the port. Once the port is accessed, a blood return should be checked. This helps ensure that the port  is in the vein and is not clogged.  If your port needs to remain accessed for a constant infusion, a clear (transparent) bandage will be placed over the needle site. The bandage and needle will need to be changed every week, or as directed by your health care provider.  Keep the bandage covering the needle clean and dry. Do not get it wet. Follow your health care provider's instructions on how to take a shower or bath while the port is accessed.  If your port does not need to stay accessed, no bandage is needed over the port.  What is flushing? Flushing helps keep the port from getting clogged. Follow your health care provider's instructions on how and when to flush the port. Ports are usually flushed with saline solution or a medicine called heparin. The need for flushing will depend on how the port is used.  If the port is used for intermittent medicines or blood draws, the port will need to be flushed: ? After medicines have been given. ? After blood has been drawn. ? As part of routine maintenance.  If a constant infusion is running, the port may not need to be flushed.  How long will my port stay implanted? The port can stay in for as long as your health care provider thinks it is needed. When it is time for the port to come out, surgery will be   done to remove it. The procedure is similar to the one performed when the port was put in. When should I seek immediate medical care? When you have an implanted port, you should seek immediate medical care if:  You notice a bad smell coming from the incision site.  You have swelling, redness, or drainage at the incision site.  You have more swelling or pain at the port site or the surrounding area.  You have a fever that is not controlled with medicine.  This information is not intended to replace advice given to you by your health care provider. Make sure you discuss any questions you have with your health care provider. Document  Released: 07/17/2005 Document Revised: 12/23/2015 Document Reviewed: 03/24/2013 Elsevier Interactive Patient Education  2017 [employer] Inc[employer]  

## 2018-05-21 ENCOUNTER — Other Ambulatory Visit: Payer: Self-pay

## 2018-05-21 ENCOUNTER — Ambulatory Visit (AMBULATORY_SURGERY_CENTER): Payer: Self-pay

## 2018-05-21 VITALS — Ht 60.0 in | Wt 131.4 lb

## 2018-05-21 DIAGNOSIS — K222 Esophageal obstruction: Secondary | ICD-10-CM

## 2018-05-21 NOTE — Progress Notes (Signed)
No egg or soy allergy known to patient  No issues with past sedation with any surgeries  or procedures, no intubation problems  No diet pills per patient No home 02 use per patient  No blood thinners per patient  Pt denies issues with constipation  No A fib or A flutter  EMMI video sent to pt's e mail , pt declined    

## 2018-05-23 ENCOUNTER — Other Ambulatory Visit: Payer: Self-pay | Admitting: Hematology

## 2018-06-03 ENCOUNTER — Ambulatory Visit (AMBULATORY_SURGERY_CENTER): Payer: Medicaid Other | Admitting: Internal Medicine

## 2018-06-03 ENCOUNTER — Encounter: Payer: Self-pay | Admitting: Internal Medicine

## 2018-06-03 VITALS — BP 108/73 | HR 89 | Temp 98.6°F | Resp 15 | Ht 60.0 in | Wt 129.0 lb

## 2018-06-03 DIAGNOSIS — K222 Esophageal obstruction: Secondary | ICD-10-CM | POA: Diagnosis not present

## 2018-06-03 DIAGNOSIS — R1310 Dysphagia, unspecified: Secondary | ICD-10-CM | POA: Diagnosis not present

## 2018-06-03 MED ORDER — SODIUM CHLORIDE 0.9 % IV SOLN: 500.0000 mL | Freq: Once | INTRAVENOUS | Status: DC

## 2018-06-03 NOTE — Op Note (Signed)
[place] Patient Name: Darrell Cunningham Procedure Date: 06/03/2018 11:28 AM MRN: [id number] Endoscopist: [name] , MD Age: 56 Referring MD:  Date of Birth: [birth year] Gender: Male Account #: [id number] Procedure:                Upper GI endoscopy Indications:              Dysphagia, Management of operative complication:                            Dilation of anastomotic gastroesophageal stricture                            after personal history of distal esophageal cancer                            status post esophagectomy with gastric                            pull-through; last dilation approximately 6 weeks                            ago Medicines:                Monitored Anesthesia Care Procedure:                Pre-Anesthesia Assessment:                           - Prior to the procedure, a History and Physical                            was performed, and patient medications and                            allergies were reviewed. The patient's tolerance of                            previous anesthesia was also reviewed. The risks                            and benefits of the procedure and the sedation                            options and risks were discussed with the patient.                            All questions were answered, and informed consent                            was obtained. Prior Anticoagulants: The patient has                            taken no previous anticoagulant or antiplatelet  agents. ASA Grade Assessment: III - A patient with                            severe systemic disease. After reviewing the risks                            and benefits, the patient was deemed in                            satisfactory condition to undergo the procedure.                           After obtaining informed consent, the endoscope was                            passed under direct vision. Throughout the                  procedure, the patient's blood pressure, pulse, and                            oxygen saturations were monitored continuously. The                            Model GIF-[id number]) scope was introduced                            through the mouth, and advanced to the second part                            of duodenum. The upper GI endoscopy was                            accomplished without difficulty. The patient                            tolerated the procedure well. Scope In: Scope Out: Findings:                 One benign-appearing, intrinsic severe (stenosis;                            an endoscope cannot pass) stenosis was found at the                            esophageal anastomosis. This stenosis measured 8 mm                            (inner diameter) x 1 cm (in length). The stenosis                            was traversed after dilation. A TTS dilator was                            passed through the scope. Dilation with a  16-17-18                            mm balloon dilator was performed to 16 mm. The                            dilation site was examined and showed moderate                            mucosal disruption and moderate improvement in                            luminal narrowing.                           Evidence of prior gastric pull-through, visible                            suture material in the proximal stomach, stomach                            otherwise was normal.                           The examined duodenum was normal. Complications:            No immediate complications. Estimated Blood Loss:     Estimated blood loss was minimal. Impression:               - Benign-appearing, anastomotic esophageal                            stenosis. Dilated to 16 mm.                           - Prior esophageal/gastric surgery; otherwise                            normal stomach.                           - Normal examined duodenum.                            - No specimens collected. Recommendation:           - Patient has a contact number available for                            emergencies. The signs and symptoms of potential                            delayed complications were discussed with the                            patient. Return to normal activities tomorrow.  Written discharge instructions were provided to the                            patient.                           - Clear liquid, post dilation, diet. Advance as                            tolerated per protocol.                           - Continue present medications.                           - Repeat upper endoscopy for retreatment as needed.                            Will discuss with colleague regarding possibility                            for [facility] stent placement for recurrent                            gastroesophageal anastomotic stricture. [name], MD 06/03/2018 11:57:24 AM This report has been signed electronically.

## 2018-06-03 NOTE — Patient Instructions (Signed)
YOU HAD AN ENDOSCOPIC PROCEDURE TODAY AT [facility]:   Refer to the procedure report that was given to you for any specific questions about what was found during the examination.  If the procedure report does not answer your questions, please call your gastroenterologist to clarify.  If you requested that your care partner not be given the details of your procedure findings, then the procedure report has been included in a sealed envelope for you to review at your convenience later.  YOU SHOULD EXPECT: Some feelings of bloating in the abdomen. Passage of more gas than usual.  Walking can help get rid of the air that was put into your GI tract during the procedure and reduce the bloating. If you had a lower endoscopy (such as a colonoscopy or flexible sigmoidoscopy) you may notice spotting of blood in your stool or on the toilet paper. If you underwent a bowel prep for your procedure, you may not have a normal bowel movement for a few days.  Please Note:  You might notice some irritation and congestion in your nose or some drainage.  This is from the oxygen used during your procedure.  There is no need for concern and it should clear up in a day or so.  SYMPTOMS TO REPORT IMMEDIATELY:     Following upper endoscopy (EGD)  Vomiting of blood or coffee ground material  New chest pain or pain under the shoulder blades  Painful or persistently difficult swallowing  New shortness of breath  Fever of 100F or higher  Black, tarry-looking stools  For urgent or emergent issues, a gastroenterologist can be reached at any hour by calling [phone].   DIET:  Follow Dilation Diet handout  ACTIVITY:  You should plan to take it easy for the rest of today and you should NOT DRIVE or use heavy machinery until tomorrow (because of the sedation medicines used during the test).    FOLLOW UP: Our staff will call the number listed on your records the next business day following your  procedure to check on you and address any questions or concerns that you may have regarding the information given to you following your procedure. If we do not reach you, we will leave a message.  However, if you are feeling well and you are not experiencing any problems, there is no need to return our call.  We will assume that you have returned to your regular daily activities without incident.  If any biopsies were taken you will be contacted by phone or by letter within the next 1-3 weeks.  Please call us at [phone] if you have not heard about the biopsies in 3 weeks.    SIGNATURES/CONFIDENTIALITY: You and/or your care partner have signed paperwork which will be entered into your electronic medical record.  These signatures attest to the fact that that the information above on your After Visit Summary has been reviewed and is understood.  Full responsibility of the confidentiality of this discharge information lies with you and/or your care-partner.   Resume medications. Information given on dilation diet and stricture.

## 2018-06-03 NOTE — Progress Notes (Signed)
Pt. Reports no change in his medical or surgical history since his pre-visit 05-21-2018.

## 2018-06-03 NOTE — Progress Notes (Signed)
Alert and oriented x 3, pleased with MAC, report to RN

## 2018-06-03 NOTE — Progress Notes (Signed)
Called to room to assist during endoscopic procedure.  Patient ID and intended procedure confirmed with present staff. Received instructions for my participation in the procedure from the performing physician.  

## 2018-06-04 ENCOUNTER — Telehealth: Payer: Self-pay

## 2018-06-04 NOTE — Telephone Encounter (Signed)
  Follow up Call-  Call back number 06/03/2018 04/15/2018 02/28/2018 01/21/2018 01/01/2018 10/25/2017 10/03/2017  Post procedure Call Back phone  # [phone]  Permission to leave phone message Yes Yes Yes Yes - Yes Yes  Some recent data might be hidden     Patient questions:  Do you have a fever, pain , or abdominal swelling? No. Pain Score  0 *  Have you tolerated food without any problems? Yes.    Have you been able to return to your normal activities? Yes.    Do you have any questions about your discharge instructions: Diet   No. Medications  No. Follow up visit  No.  Do you have questions or concerns about your Care? No.  Actions: * If pain score is 4 or above: No action needed, pain <4.

## 2018-06-06 ENCOUNTER — Telehealth: Payer: Self-pay

## 2018-06-06 NOTE — Telephone Encounter (Signed)
Darrell Cunningham,  I do think that a cold-axios would be reasonable.  I probably wouldn't keep it longer than 3-4 weeks because of risk of migration and then if it got stuck in the small bowel, could be problematic to try and get it out if it causes obtruction.  Be happy to see in clinic an determine timing of procedure.  Have you used any corticosteroids, as there is data to help decrease stricture recurrence with steroids intralesionally?  [name], please work on getting a follow up scheduled with me in the coming weeks to discuss AXIOS stenting for his esopahgeal stricture.  Thanks.  [name]

## 2018-06-06 NOTE — Telephone Encounter (Signed)
Appt made for 07/03/18 at 8:45 am to discuss AXIOS stenting.

## 2018-06-07 ENCOUNTER — Other Ambulatory Visit: Payer: Self-pay | Admitting: Family Medicine

## 2018-06-07 NOTE — Telephone Encounter (Signed)
Requesting refill    Flexeril  LOV: 10/05/17  LRF:  10/02/17

## 2018-06-07 NOTE — Progress Notes (Signed)
Spoke to Dr. Rush Landmark about refractory gastroesophageal stricture He agreed that axios stent would be an option, however he recommend we try intra-lesional steroids  Please bring patient back to the [facility] for dilation with plan for steroid injection at the stricture If this fails can consider stenting with Dr. Rush Landmark  [facility] in 4 to 6 weeks, does not need previsit given multiple previous EGDs

## 2018-06-11 ENCOUNTER — Telehealth: Payer: Self-pay

## 2018-06-11 NOTE — Telephone Encounter (Signed)
-----   Message from [name], MD sent at 06/10/2018  5:22 PM EST ----- Regarding: RE: Question Thanks for checking I spoke with GM and we decided to try steroid injection 1st before stent Appt with GM can be canceled for now Thanks JMP  ----- Message ----- From: Algernon Huxley, RN Sent: 06/10/2018   4:18 PM EST To: [name], MD Subject: Question                                       I scheduled pt for [facility] with dilation/steroid injection 07/04/18@10am . Confused because pt is scheduled for an OV with Mansouraty on 07/03/18 to discuss Axios. Is this ok or was he to have steroid injections first to see if help prior to seeing Mansouraty?

## 2018-06-11 NOTE — Progress Notes (Signed)
Pt scheduled for EGD with dil and steroid injection in the [facility], see phone note. Pt aware.

## 2018-06-11 NOTE — Telephone Encounter (Signed)
Spoke with pt and he is aware of the appt. Appt with Dr. Rush Landmark cancelled and pt knows to have a driver for the procedure.

## 2018-06-13 ENCOUNTER — Telehealth: Payer: Self-pay | Admitting: Internal Medicine

## 2018-06-13 NOTE — Telephone Encounter (Signed)
Adding conversation to chart JMP

## 2018-06-13 NOTE — Telephone Encounter (Signed)
-----   Message from Irving Copas., MD sent at 06/12/2018  5:28 PM EST ----- No problem Ulice Dash. We were very aggressive with these patient and would bring them back every 1-2 weeks to ensure that we continued the dilation and did 2 steroid injection procedures.  I think if we wait too much longer then restenosis chance increases significantly.  Happy to be available for procedures as needed.   All the best. [name] ----- Message ----- From: [name], MD Sent: 06/12/2018  10:18 AM EST To: Irving Copas., MD  Gabe Thanks for being willing to see this patient.  I am going to bring him back to the [facility] for dilation with intra-lesional triamcinolone injection.  We will let you know how he does and if we want to consider axios stent later Thanks JMP  ----- Message ----- From: Irving Copas., MD Sent: 06/06/2018  12:24 AM EST To: Timothy Lasso, RN, [name], MD  Ulice Dash, I do think that a cold-axios would be reasonable. I probably wouldn't keep it longer than 3-4 weeks because of risk of migration and then if it got stuck in the small bowel, could be problematic to try and get it out if it causes obtruction. Be happy to see in clinic an determine timing of procedure. Have you used any corticosteroids, as there is data to help decrease stricture recurrence with steroids intralesionally? Patty, please work on getting a follow up scheduled with me in the coming weeks to discuss AXIOS stenting for his esopahgeal stricture. Thanks. Gabe ----- Message ----- From: [name], MD Sent: 06/03/2018  12:03 PM EST To: Irving Copas., MD   [name], Would you take a look at this patient.  Would he be a candidate for Axios stent? 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 I repeat dilation frequently about every 6 weeks, which helps but symptoms recur fairly quickly Gayleen Orem previously performed needle-knife x2 and even with this the  stricture recurs Thanks for looking it over and we can discuss JMP

## 2018-06-20 ENCOUNTER — Ambulatory Visit: Payer: Medicaid Other | Admitting: Cardiothoracic Surgery

## 2018-06-20 VITALS — BP 115/70 | HR 82 | Resp 20 | Ht 60.0 in | Wt 131.0 lb

## 2018-06-20 DIAGNOSIS — Z09 Encounter for follow-up examination after completed treatment for conditions other than malignant neoplasm: Secondary | ICD-10-CM

## 2018-06-20 DIAGNOSIS — C159 Malignant neoplasm of esophagus, unspecified: Secondary | ICD-10-CM

## 2018-06-20 NOTE — Progress Notes (Signed)
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 PROCEDURES PERFORMED:  Bronchoscopy, transhiatal total esophagectomy with cervical esophagogastrostomy, pyloromyotomy, and feeding jejunostomy tube. SURGEON:  [name], MD.  Cancer Staging Malignant neoplasm of lower third of esophagus [facility]) Staging form: Esophagus - Adenocarcinoma, AJCC 8th Edition - Clinical stage from 10/26/2016: Stage III (cT3, cN1, cM0) - Signed by [name], MD on 11/05/2016 - Pathologic stage from 04/20/2017: Stage I (ypT0, pN0, cM0, GX) - Signed by [name], MD on 04/23/2017   History of Present Illness:      Patient returns to the office today for postop check after  Esophagectomy September 2018.  Patient continues to have intermittent problems with anastomotic stricture.  He is able to take a p.o. diet and has been maintaining his weight relatively well.  He notes he is to have a esophageal dilatation with steroid injections in the next week.   Zubrod Score: At the time of surgery this patient's most appropriate activity status/level should be described as: [x]     0    Normal activity, no symptoms [x]     1    Restricted in physical strenuous activity but ambulatory, able to do out light work []     2    Ambulatory and capable of self care, unable to do work activities, up and about                 >50 % of waking hours                                                                                   []     3    Only limited self  care, in bed greater than 50% of waking hours []     4    Completely disabled, no self care, confined to bed or chair []     5    Moribund  Social History   Tobacco Use  Smoking Status Former Smoker  . Packs/day: 1.00  . Years: 38.00  . Pack years: 38.00  . Types: Cigarettes  . Start date: 10/31/2012  . Last attempt to quit: 10/31/2012  . Years since quitting: 5.6  Smokeless Tobacco Never Used       Allergies  Allergen Reactions  . Penicillins Hives, Itching and Swelling    Has patient had a PCN reaction causing immediate rash, facial/tongue/throat swelling, SOB or lightheadedness with hypotension: No Has patient had a PCN reaction causing severe rash involving mucus membranes or skin necrosis: No Has patient had a PCN reaction that required hospitalization No Has patient had a PCN reaction occurring within  the last 10 years: No If all of the above answers are "NO", then may proceed with Cephalosporin use. Unknown Childhood reaction    Current Outpatient Medications  Medication Sig Dispense Refill  . acetaminophen (TYLENOL) 160 MG/5ML solution Take 10.2 mLs (325 mg total) by mouth every 4 (four) hours as needed for moderate pain. 120 mL 0  . albuterol (ACCUNEB) 0.63 MG/3ML nebulizer solution Take 1 ampule by nebulization every 6 (six) hours as needed for wheezing.    Marland Kitchen CARAFATE 1 GM/10ML suspension TAKE 10 MLS BY MOUTH 4 TIMES A DAY BEFORE MEALS AND AT BEDTIME 1200 mL 2  . cyclobenzaprine (FLEXERIL) 5 MG tablet TAKE 1 TABLET BY MOUTH THREE TIMES A DAY AS NEEDED FOR MUSCLE SPASMS 30 tablet 0  . Electrolyte-S (ISOLYTE-S IV) Inject into the vein. Drinks orally use to use through feeding tube.    . fluticasone (FLONASE) 50 MCG/ACT nasal spray Place 1 spray into both nostrils daily as needed for allergies or rhinitis.    . fluticasone (FLOVENT DISKUS) 50 MCG/BLIST diskus inhaler Inhale into the lungs.    . lactose free nutrition (BOOST) LIQD Take 237 mLs 2 (two) times daily between meals  by mouth.    . lidocaine-prilocaine (EMLA) cream APPLY TO PORT-A-CATH SITE 1.5 TO 2 HOURS PRIOR TO PROCEDURE AS NEEDED 30 g 1  . pantoprazole (PROTONIX) 40 MG tablet TAKE 1 TABLET BY MOUTH TWICE A DAY 60 tablet 11   Current Facility-Administered Medications  Medication Dose Route Frequency Provider Last Rate Last Dose  . 0.9 %  sodium chloride infusion  500 mL Intravenous Once Pyrtle, [name], MD        Physical Exam: BP 115/70   Pulse 82   Resp 20   Ht 5' (1.524 m)   Wt 131 lb (59.4 kg)   SpO2 98% Comment: RA  BMI 25.58 kg/m   General appearance: alert, cooperative and no distress Head: Normocephalic, without obvious abnormality, atraumatic Neck: no adenopathy, no carotid bruit, no JVD, supple, symmetrical, trachea midline and thyroid not enlarged, symmetric, no tenderness/mass/nodules Lymph nodes: Cervical, supraclavicular, and axillary nodes normal. Resp: clear to auscultation bilaterally Back: symmetric, no curvature. ROM normal. No CVA tenderness. Cardio: regular rate and rhythm, S1, S2 normal, no murmur, click, rub or gallop GI: soft, non-tender; bowel sounds normal; no masses,  no organomegaly Extremities: extremities normal, atraumatic, no cyanosis or edema and Homans sign is negative, no sign of DVT Neurologic: Grossly normal  Diagnostic Studies & Laboratory data:         Recent Radiology Findings: CLINICAL DATA:  Esophageal cancer, chemotherapy and radiation therapy complete.  EXAM: CT CHEST and abdomen WITH CONTRAST  TECHNIQUE: Multidetector CT imaging of the chest and abdomen was performed following the standard protocol during bolus administration of intravenous contrast.  CONTRAST:  128mL OMNIPAQUE IOHEXOL 300 MG/ML  SOLN  COMPARISON:  CT chest abdomen 09/11/2017.  FINDINGS: CT CHEST FINDINGS  Cardiovascular: Right IJ Port-A-Cath terminates in the right atrium. Atherosclerotic calcification of the arterial vasculature, including coronary arteries.  Heart size normal. Small pericardial effusion is stable.  Mediastinum/Nodes: Mediastinal lymph nodes are not enlarged by CT size criteria and are stable. No hilar or axillary adenopathy. Esophagectomy and gastric pull-through.  Lungs/Pleura: Mild paraseptal emphysema. Irregular consolidation in the medial aspect of the right lower lobe (series 7, image 105), unchanged. Lungs are otherwise clear. No pleural fluid. Airway is unremarkable.  Musculoskeletal: No worrisome lytic or sclerotic lesions.  CT ABDOMEN FINDINGS  Hepatobiliary: Liver and  gallbladder are unremarkable. No biliary ductal dilatation.  Pancreas: Negative.  Spleen: Negative.  Adrenals/Urinary Tract: Adrenal glands and kidneys are unremarkable.  Stomach/Bowel: Gastric pull-through. Visualized portions of the small bowel, appendix and colon are unremarkable.  Vascular/Lymphatic: Atherosclerotic calcification of the arterial vasculature without abdominal aortic aneurysm. Lymph node at the diaphragmatic hiatus measures 8 mm (series 2, image 51), unchanged.  Other: No free fluid.  Mesenteries and peritoneum are unremarkable.  Musculoskeletal: No worrisome lytic or sclerotic lesions.  IMPRESSION: 1. No evidence of metastatic disease. 2. Aortic atherosclerosis (ICD10-170.0). Coronary artery calcification. 3.  Emphysema (ICD10-J43.9).   Electronically Signed   By: Lorin Picket M.D.   On: 04/10/2018 15:07  I have independently reviewed the above radiology studies  and reviewed the findings with the patient.  Recent Labs: Lab Results  Component Value Date   WBC 5.0 04/10/2018   HGB 13.1 04/10/2018   HCT 39.1 04/10/2018   PLT 153 04/10/2018   GLUCOSE 96 04/10/2018   CHOL 157 10/27/2014   TRIG 83 10/27/2014   HDL 34 (L) 10/27/2014   LDLCALC 106 (H) 10/27/2014   ALT 17 04/10/2018   AST 18 04/10/2018   NA 142 04/10/2018   K 4.0 04/10/2018   CL 108 04/10/2018   CREATININE 0.69  04/10/2018   BUN 14 04/10/2018   CO2 26 04/10/2018   INR 0.91 04/12/2017   HGBA1C 5.7 (H) 10/27/2014   Wt Readings from Last 3 Encounters:  06/20/18 131 lb (59.4 kg)  06/03/18 129 lb (58.5 kg)  05/21/18 131 lb 6.4 oz (59.6 kg)      Assessment / Plan:   Patient pathologic stage I esophageal cancer treated preoperatively with radiation and chemotherapy and subsequent esophagectomy.  Recent CT scan in September shows no evidence of recurrent metastatic esophageal cancer. Patient continues to require intermittent esophageal dilatations, to have one next week with steroid injection. Continue to use intermittent esophageal dilatation for his anastomotic stricture.  Plan to see him back in 3 months  [name] 06/20/2018 10:53 AM

## 2018-07-03 ENCOUNTER — Ambulatory Visit: Payer: Medicaid Other | Admitting: Gastroenterology

## 2018-07-04 ENCOUNTER — Encounter: Payer: Self-pay | Admitting: Internal Medicine

## 2018-07-04 ENCOUNTER — Ambulatory Visit (AMBULATORY_SURGERY_CENTER): Payer: Medicaid Other | Admitting: Internal Medicine

## 2018-07-04 VITALS — BP 104/75 | HR 100 | Temp 98.6°F | Resp 18 | Ht 60.0 in | Wt 131.0 lb

## 2018-07-04 DIAGNOSIS — K222 Esophageal obstruction: Secondary | ICD-10-CM

## 2018-07-04 DIAGNOSIS — R1319 Other dysphagia: Secondary | ICD-10-CM

## 2018-07-04 DIAGNOSIS — K913 Postprocedural intestinal obstruction, unspecified as to partial versus complete: Secondary | ICD-10-CM

## 2018-07-04 DIAGNOSIS — R1310 Dysphagia, unspecified: Secondary | ICD-10-CM | POA: Diagnosis not present

## 2018-07-04 DIAGNOSIS — Z9049 Acquired absence of other specified parts of digestive tract: Secondary | ICD-10-CM

## 2018-07-04 DIAGNOSIS — Z9889 Other specified postprocedural states: Secondary | ICD-10-CM

## 2018-07-04 MED ORDER — SODIUM CHLORIDE 0.9 % IV SOLN: 500.0000 mL | Freq: Once | INTRAVENOUS | Status: DC

## 2018-07-04 NOTE — Progress Notes (Signed)
Called to room to assist during endoscopic procedure.  Patient ID and intended procedure confirmed with present staff. Received instructions for my participation in the procedure from the performing physician.  

## 2018-07-04 NOTE — Patient Instructions (Signed)
Handouts given for stricture and post dilation diet  YOU HAD AN ENDOSCOPIC PROCEDURE TODAY AT [facility]:   Refer to the procedure report that was given to you for any specific questions about what was found during the examination.  If the procedure report does not answer your questions, please call your gastroenterologist to clarify.  If you requested that your care partner not be given the details of your procedure findings, then the procedure report has been included in a sealed envelope for you to review at your convenience later.  YOU SHOULD EXPECT: Some feelings of bloating in the abdomen. Passage of more gas than usual.  Walking can help get rid of the air that was put into your GI tract during the procedure and reduce the bloating. If you had a lower endoscopy (such as a colonoscopy or flexible sigmoidoscopy) you may notice spotting of blood in your stool or on the toilet paper. If you underwent a bowel prep for your procedure, you may not have a normal bowel movement for a few days.  Please Note:  You might notice some irritation and congestion in your nose or some drainage.  This is from the oxygen used during your procedure.  There is no need for concern and it should clear up in a day or so.  SYMPTOMS TO REPORT IMMEDIATELY:    Following upper endoscopy (EGD)  Vomiting of blood or coffee ground material  New chest pain or pain under the shoulder blades  Painful or persistently difficult swallowing  New shortness of breath  Fever of 100F or higher  Black, tarry-looking stools  For urgent or emergent issues, a gastroenterologist can be reached at any hour by calling [phone].   DIET:  We do recommend a small meal at first, but then you may proceed to your regular diet.  Drink plenty of fluids but you should avoid alcoholic beverages for 24 hours.  ACTIVITY:  You should plan to take it easy for the rest of today and you should NOT DRIVE or use heavy machinery  until tomorrow (because of the sedation medicines used during the test).    FOLLOW UP: Our staff will call the number listed on your records the next business day following your procedure to check on you and address any questions or concerns that you may have regarding the information given to you following your procedure. If we do not reach you, we will leave a message.  However, if you are feeling well and you are not experiencing any problems, there is no need to return our call.  We will assume that you have returned to your regular daily activities without incident.  If any biopsies were taken you will be contacted by phone or by letter within the next 1-3 weeks.  Please call us at [phone] if you have not heard about the biopsies in 3 weeks.    SIGNATURES/CONFIDENTIALITY: You and/or your care partner have signed paperwork which will be entered into your electronic medical record.  These signatures attest to the fact that that the information above on your After Visit Summary has been reviewed and is understood.  Full responsibility of the confidentiality of this discharge information lies with you and/or your care-partner.

## 2018-07-04 NOTE — Progress Notes (Addendum)
After dilation, per MD order, 50 mg of Kenalog was injected into esophageal stricture in 4 divided doses.   LOT # [id number] [place] # [id number] EXP 11/2019

## 2018-07-04 NOTE — Op Note (Signed)
[facility] Patient Name: Darrell Cunningham Procedure Date: 07/04/2018 9:26 AM MRN: [id number] Endoscopist: [name] , MD Age: 57 Referring MD:  Date of Birth: 12-10-1960 Gender: Male Account #: [id number] Procedure:                Upper GI endoscopy Indications:              For therapy of post-surgical anastomotic stenosis;                            personal history of esophageal cancer status post                            esophagectomy; refractory gastroesophageal                            anastomotic stricture; esophageal dilations most                            recently 06/03/2018, 04/15/2018, 02/28/2018, and                            01/21/2018 Medicines:                Monitored Anesthesia Care                           Triamcinolone 50 mg total (intralesional) Procedure:                Pre-Anesthesia Assessment:                           - Prior to the procedure, a History and Physical                            was performed, and patient medications and                            allergies were reviewed. The patient's tolerance of                            previous anesthesia was also reviewed. The risks                            and benefits of the procedure and the sedation                            options and risks were discussed with the patient.                            All questions were answered, and informed consent                            was obtained. Prior Anticoagulants: The patient has                            taken no  previous anticoagulant or antiplatelet                            agents. ASA Grade Assessment: III - A patient with                            severe systemic disease. After reviewing the risks                            and benefits, the patient was deemed in                            satisfactory condition to undergo the procedure.                           After obtaining informed consent, the endoscope was                   passed under direct vision. Throughout the                            procedure, the patient's blood pressure, pulse, and                            oxygen saturations were monitored continuously. The                            Model [id number] scope was introduced                            through the mouth, and advanced to the second part                            of duodenum. The upper GI endoscopy was                            accomplished without difficulty. The patient                            tolerated the procedure well. Scope In: Scope Out: Findings:                 One benign-appearing, intrinsic moderate                            (circumferential scarring or stenosis; an endoscope                            may pass) stenosis was found at the esophageal                            anastomosis at 20 cm from the incisors. This                            stenosis measured 9 mm (inner diameter) x less than  one cm (in length). The stenosis was able to be                            traversed, for the first time without first                            dilating. A TTS dilator was passed through the                            scope. Dilation with a 16-17-18 mm balloon dilator                            was performed to 17 mm. The dilation site was                            examined and showed moderate mucosal disruption and                            moderate improvement in luminal narrowing.                            Four-quadrant area was successfully injected with                            1.2 mL of triamcinolone (40 mg/mL for a total of 50                            mg) for drug delivery.                           Evidence of a previous surgical anastomosis was                            found in the gastric body. This was characterized                            by visible sutures.                           The exam of  the stomach was otherwise normal.                           The examined duodenum was normal. Complications:            No immediate complications. Estimated Blood Loss:     Estimated blood loss was minimal. Impression:               - Benign-appearing esophageal stenosis. Dilated to                            17 mm. Intralesional steroid injection.                           - A previous surgical anastomosis was found,  characterized by visible sutures.                           - Normal examined duodenum.                           - No specimens collected. Recommendation:           - Patient has a contact number available for                            emergencies. The signs and symptoms of potential                            delayed complications were discussed with the                            patient. Return to normal activities tomorrow.                            Written discharge instructions were provided to the                            patient.                           - Post dilation diet x2 hours, advance as tolerated                           - Continue present medications.                           - Repeat upper endoscopy in 6 weeks for                            reassessment and probable retreatment. If                            intralesional steroid injection not helpful to                            reduce need for frequent/short interval repeat                            dilation then Dr. Rush Landmark will consider Axios                            stent placement. [name], MD 07/04/2018 9:56:28 AM This report has been signed electronically.

## 2018-07-04 NOTE — Progress Notes (Signed)
To PACU, VSS. Report to Rn.tb 

## 2018-07-05 ENCOUNTER — Other Ambulatory Visit: Payer: Self-pay | Admitting: Family Medicine

## 2018-07-05 ENCOUNTER — Telehealth: Payer: Self-pay

## 2018-07-05 ENCOUNTER — Telehealth: Payer: Self-pay | Admitting: *Deleted

## 2018-07-05 NOTE — Telephone Encounter (Signed)
I spoke with Dr. [name] about this patient- d/t possibly needing Kenalog to inject.  He states he instructed the patient to call back in 2 weeks with an update on how he doing.  Then, Dr. [name] will decide if he wants to schedule an EGD  [facility]

## 2018-07-05 NOTE — Telephone Encounter (Signed)
  Follow up Call-  Call back number 07/04/2018 06/03/2018 04/15/2018 02/28/2018 01/21/2018 01/01/2018 10/25/2017  Post procedure Call Back phone  # [phone]  Permission to leave phone message Yes Yes Yes Yes Yes - Yes  Some recent data might be hidden     Patient questions:  Do you have a fever, pain , or abdominal swelling? No. Pain Score  0 *  Have you tolerated food without any problems? Yes.    Have you been able to return to your normal activities? Yes.    Do you have any questions about your discharge instructions: Diet   No. Medications  No. Follow up visit  No.  Do you have questions or concerns about your Care? No.  Actions: * If pain score is 4 or above: No action needed, pain <4.

## 2018-07-05 NOTE — Telephone Encounter (Signed)
Ok to refill 

## 2018-07-05 NOTE — Telephone Encounter (Signed)
Noted  

## 2018-07-05 NOTE — Telephone Encounter (Signed)
-----   Message from Bea Laura, RN sent at 07/05/2018  2:26 PM EST ----- Regarding: FW: EGD and probable injection   ----- Message ----- From: Algernon Huxley, RN Sent: 07/05/2018  11:00 AM EST To: Bea Laura, RN Subject: EGD and probable injection                     [name],  This pt had EGD with injection by Dr. [name] 07/04/18. Per the report he is to have repeat EGD with probable injection in 6 weeks. This was not scheduled.   Thanks, [employer]

## 2018-08-05 ENCOUNTER — Ambulatory Visit (AMBULATORY_SURGERY_CENTER): Payer: Self-pay

## 2018-08-05 VITALS — Ht 60.0 in | Wt 132.0 lb

## 2018-08-05 DIAGNOSIS — R1310 Dysphagia, unspecified: Secondary | ICD-10-CM

## 2018-08-05 DIAGNOSIS — K913 Postprocedural intestinal obstruction, unspecified as to partial versus complete: Secondary | ICD-10-CM

## 2018-08-05 DIAGNOSIS — R1319 Other dysphagia: Secondary | ICD-10-CM

## 2018-08-05 NOTE — Progress Notes (Signed)
Per pt, no allergies to soy or egg products.Pt not taking any weight loss meds or using  O2 at home.  Pt refused emmi video. 

## 2018-08-19 ENCOUNTER — Encounter: Payer: Self-pay | Admitting: Internal Medicine

## 2018-08-19 ENCOUNTER — Ambulatory Visit (AMBULATORY_SURGERY_CENTER): Payer: Medicaid Other | Admitting: Internal Medicine

## 2018-08-19 VITALS — BP 108/70 | HR 82 | Temp 98.2°F | Resp 21 | Ht 60.0 in | Wt 132.0 lb

## 2018-08-19 DIAGNOSIS — K222 Esophageal obstruction: Secondary | ICD-10-CM

## 2018-08-19 DIAGNOSIS — K913 Postprocedural intestinal obstruction, unspecified as to partial versus complete: Secondary | ICD-10-CM

## 2018-08-19 DIAGNOSIS — R1310 Dysphagia, unspecified: Secondary | ICD-10-CM | POA: Diagnosis not present

## 2018-08-19 MED ORDER — SODIUM CHLORIDE 0.9 % IV SOLN: 500.0000 mL | Freq: Once | INTRAVENOUS | Status: DC

## 2018-08-19 NOTE — Progress Notes (Signed)
YOU HAD AN ENDOSCOPIC PROCEDURE TODAY AT THE [facility]:   Refer to the procedure report that was given to you for any specific questions about what was found during the examination.  If the procedure report does not answer your questions, please call your gastroenterologist to clarify.  If you requested that your care partner not be given the details of your procedure findings, then the procedure report has been included in a sealed envelope for you to review at your convenience later.  YOU SHOULD EXPECT: Some feelings of bloating in the abdomen. Passage of more gas than usual.  Walking can help get rid of the air that was put into your GI tract during the procedure and reduce the bloating. If you had a lower endoscopy (such as a colonoscopy or flexible sigmoidoscopy) you may notice spotting of blood in your stool or on the toilet paper. If you underwent a bowel prep for your procedure, you may not have a normal bowel movement for a few days.  Please Note:  You might notice some irritation and congestion in your nose or some drainage.  This is from the oxygen used during your procedure.  There is no need for concern and it should clear up in a day or so.  SYMPTOMS TO REPORT IMMEDIATELY:   Following lower endoscopy (colonoscopy or flexible sigmoidoscopy):  Excessive amounts of blood in the stool  Significant tenderness or worsening of abdominal pains  Swelling of the abdomen that is new, acute  Fever of 100F or higher   Following upper endoscopy (EGD)  Vomiting of blood or coffee ground material  New chest pain or pain under the shoulder blades  Painful or persistently difficult swallowing  New shortness of breath  Fever of 100F or higher  Black, tarry-looking stools  For urgent or emergent issues, a gastroenterologist can be reached at any hour by calling [phone].   DIET:  We do recommend a small meal at first, but then you may proceed to your regular diet.  Drink  plenty of fluids but you should avoid alcoholic beverages for 24 hours.  ACTIVITY:  You should plan to take it easy for the rest of today and you should NOT DRIVE or use heavy machinery until tomorrow (because of the sedation medicines used during the test).    FOLLOW UP: Our staff will call the number listed on your records the next business day following your procedure to check on you and address any questions or concerns that you may have regarding the information given to you following your procedure. If we do not reach you, we will leave a message.  However, if you are feeling well and you are not experiencing any problems, there is no need to return our call.  We will assume that you have returned to your regular daily activities without incident.  If any biopsies were taken you will be contacted by phone or by letter within the next 1-3 weeks.  Please call us at [phone] if you have not heard about the biopsies in 3 weeks.    SIGNATURES/CONFIDENTIALITY: You and/or your care partner have signed paperwork which will be entered into your electronic medical record.  These signatures attest to the fact that that the information above on your After Visit Summary has been reviewed and is understood.  Full responsibility of the confidentiality of this discharge information lies with you and/or your care-partner. 

## 2018-08-19 NOTE — Patient Instructions (Signed)
   Pt to have repeat EGD with steroid injection (see appointment on next page)- appointment made by Clent Ridges RN  No previsit w/ nurse necessary prior to next Egd per Dr [name]   Remain on soft foods today per Dr [name]    YOU HAD AN ENDOSCOPIC PROCEDURE TODAY AT THE [facility]:   Refer to the procedure report that was given to you for any specific questions about what was found during the examination.  If the procedure report does not answer your questions, please call your gastroenterologist to clarify.  If you requested that your care partner not be given the details of your procedure findings, then the procedure report has been included in a sealed envelope for you to review at your convenience later.  YOU SHOULD EXPECT: Some feelings of bloating in the abdomen. Passage of more gas than usual.  Walking can help get rid of the air that was put into your GI tract during the procedure and reduce the bloating. If you had a lower endoscopy (such as a colonoscopy or flexible sigmoidoscopy) you may notice spotting of blood in your stool or on the toilet paper. If you underwent a bowel prep for your procedure, you may not have a normal bowel movement for a few days.  Please Note:  You might notice some irritation and congestion in your nose or some drainage.  This is from the oxygen used during your procedure.  There is no need for concern and it should clear up in a day or so.  SYMPTOMS TO REPORT IMMEDIATELY:     Following upper endoscopy (EGD)  Vomiting of blood or coffee ground material  New chest pain or pain under the shoulder blades  Painful or persistently difficult swallowing  New shortness of breath  Fever of 100F or higher  Black, tarry-looking stools  For urgent or emergent issues, a gastroenterologist can be reached at any hour by calling [phone].   DIET:  Soft foods only today -per Dr [name]   ACTIVITY:  You should plan to take it easy for the rest of  today and you should NOT DRIVE or use heavy machinery until tomorrow (because of the sedation medicines used during the test).    FOLLOW UP: Our staff will call the number listed on your records the next business day following your procedure to check on you and address any questions or concerns that you may have regarding the information given to you following your procedure. If we do not reach you, we will leave a message.  However, if you are feeling well and you are not experiencing any problems, there is no need to return our call.  We will assume that you have returned to your regular daily activities without incident.  If any biopsies were taken you will be contacted by phone or by letter within the next 1-3 weeks.  Please call us at [phone] if you have not heard about the biopsies in 3 weeks.    SIGNATURES/CONFIDENTIALITY: You and/or your care partner have signed paperwork which will be entered into your electronic medical record.  These signatures attest to the fact that that the information above on your After Visit Summary has been reviewed and is understood.  Full responsibility of the confidentiality of this discharge information lies with you and/or your care-partner.

## 2018-08-19 NOTE — Progress Notes (Signed)
Called to room to assist during endoscopic procedure.  Patient ID and intended procedure confirmed with present staff. Received instructions for my participation in the procedure from the performing physician.  

## 2018-08-19 NOTE — Progress Notes (Signed)
Pt's states no medical or surgical changes since previsit or office visit. 

## 2018-08-19 NOTE — Op Note (Signed)
[facility] Patient Name: Darrell Cunningham Procedure Date: 08/19/2018 10:06 AM MRN: [id number] Endoscopist: [name] , MD Age: 58 Referring MD:  Date of Birth: November 12, 1960 Gender: Male Account #: [id number] Procedure:                Upper GI endoscopy Indications:              Dysphagia, Management of operative complication:                            Dilation of anastomotic stricture; personal history                            of esophageal cancer status post esophagectomy,                            refractory anastomotic stricture treated on                            07/04/2018 with dilation and intralesional                            corticosteroid injection Medicines:                Monitored Anesthesia Care Procedure:                Pre-Anesthesia Assessment:                           - Prior to the procedure, a History and Physical                            was performed, and patient medications and                            allergies were reviewed. The patient's tolerance of                            previous anesthesia was also reviewed. The risks                            and benefits of the procedure and the sedation                            options and risks were discussed with the patient.                            All questions were answered, and informed consent                            was obtained. Prior Anticoagulants: The patient has                            taken no previous anticoagulant or antiplatelet  agents. ASA Grade Assessment: III - A patient with                            severe systemic disease. After reviewing the risks                            and benefits, the patient was deemed in                            satisfactory condition to undergo the procedure.                           After obtaining informed consent, the endoscope was                            passed under direct vision. Throughout  the                            procedure, the patient's blood pressure, pulse, and                            oxygen saturations were monitored continuously. The                            Endoscope was introduced through the mouth, and                            advanced to the second part of duodenum. The upper                            GI endoscopy was accomplished without difficulty.                            The patient tolerated the procedure well. Scope In: Scope Out: Findings:                 One benign-appearing, intrinsic moderate                            (circumferential scarring or stenosis; an endoscope                            may pass) stenosis was found 20 cm from the                            incisors. This narrowing shows much improved                            patency after corticosteroid injection compared to                            the multiple prior endoscopies without                            corticosteroid  injection. This stenosis measured                            1.1 cm (inner diameter) x less than one cm (in                            length). The stenosis was traversed, for the first                            time without first dilating. A TTS dilator was                            passed through the scope. Dilation with a 16-17-18                            mm balloon dilator was performed to 18 mm. The                            dilation site was examined and showed moderate                            mucosal disruption.                           Evidence of a previous surgical anastomosis was                            found in the cardia and in the gastric fundus. This                            was characterized by visible sutures.                           The exam of the stomach was otherwise normal.                           The examined duodenum was normal. Complications:            No immediate complications. Estimated Blood Loss:      Estimated blood loss was minimal. Impression:               - Benign-appearing esophageal stenosis (improved                            appearing after corticosteroid injection). Dilated                            to 18 mm.                           - A previous surgical anastomosis was found,                            characterized by visible sutures.                           -  Normal examined duodenum.                           - No specimens collected. Recommendation:           - Patient has a contact number available for                            emergencies. The signs and symptoms of potential                            delayed complications were discussed with the                            patient. Return to normal activities tomorrow.                            Written discharge instructions were provided to the                            patient.                           - Resume previous diet.                           - Continue present medications.                           - Repeat upper endoscopy in 6 weeks for                            retreatment. Plan additional corticosteroid                            injection at next EGD. I expect after this next EGD                            we can lengthen the amount of time between                            dilations, symptoms/dysphagia dependent. [name], MD 08/19/2018 10:23:22 AM This report has been signed electronically.

## 2018-08-19 NOTE — Progress Notes (Signed)
A/ox3, pleased with MAC, report to RN 

## 2018-08-19 NOTE — Progress Notes (Signed)
Lupus notified to order steroid injection for next EGD- as Dr [name] ordered .

## 2018-08-20 ENCOUNTER — Telehealth: Payer: Self-pay

## 2018-08-20 NOTE — Telephone Encounter (Signed)
  Follow up Call-  Call back number 08/19/2018 07/04/2018 06/03/2018 04/15/2018 02/28/2018 01/21/2018 01/01/2018  Post procedure Call Back phone  # [phone]  Permission to leave phone message Yes Yes Yes Yes Yes Yes -  Some recent data might be hidden     Patient questions:  Do you have a fever, pain , or abdominal swelling? No. Pain Score  0 *  Have you tolerated food without any problems? Yes.    Have you been able to return to your normal activities? Yes.    Do you have any questions about your discharge instructions: Diet   No. Medications  No. Follow up visit  No.  Do you have questions or concerns about your Care? No.  Actions: * If pain score is 4 or above: No action needed, pain <4.

## 2018-08-21 ENCOUNTER — Other Ambulatory Visit: Payer: Self-pay | Admitting: Family Medicine

## 2018-08-22 NOTE — Telephone Encounter (Signed)
Ok to refill 

## 2018-09-18 ENCOUNTER — Ambulatory Visit (INDEPENDENT_AMBULATORY_CARE_PROVIDER_SITE_OTHER): Payer: Medicaid Other | Admitting: Family Medicine

## 2018-09-18 DIAGNOSIS — Z23 Encounter for immunization: Secondary | ICD-10-CM | POA: Diagnosis not present

## 2018-09-24 ENCOUNTER — Encounter: Payer: Self-pay | Admitting: Internal Medicine

## 2018-09-24 ENCOUNTER — Ambulatory Visit (AMBULATORY_SURGERY_CENTER): Payer: Medicaid Other | Admitting: Internal Medicine

## 2018-09-24 VITALS — BP 92/54 | HR 75 | Temp 98.6°F | Resp 29 | Ht 60.0 in | Wt 131.0 lb

## 2018-09-24 DIAGNOSIS — K222 Esophageal obstruction: Secondary | ICD-10-CM | POA: Diagnosis not present

## 2018-09-24 DIAGNOSIS — R1310 Dysphagia, unspecified: Secondary | ICD-10-CM | POA: Diagnosis not present

## 2018-09-24 DIAGNOSIS — K9189 Other postprocedural complications and disorders of digestive system: Secondary | ICD-10-CM | POA: Diagnosis not present

## 2018-09-24 DIAGNOSIS — K913 Postprocedural intestinal obstruction, unspecified as to partial versus complete: Secondary | ICD-10-CM

## 2018-09-24 DIAGNOSIS — R1312 Dysphagia, oropharyngeal phase: Secondary | ICD-10-CM

## 2018-09-24 DIAGNOSIS — B3781 Candidal esophagitis: Secondary | ICD-10-CM

## 2018-09-24 MED ORDER — SODIUM CHLORIDE 0.9 % IV SOLN: 500.0000 mL | Freq: Once | INTRAVENOUS | Status: DC

## 2018-09-24 MED ORDER — FLUCONAZOLE 10 MG/ML PO SUSR: ORAL | 0 refills | Status: DC

## 2018-09-24 NOTE — Progress Notes (Signed)
Called to room to assist during endoscopic procedure.  Patient ID and intended procedure confirmed with present staff. Received instructions for my participation in the procedure from the performing physician.  

## 2018-09-24 NOTE — Patient Instructions (Signed)
Discharge instructions given. Prescription sent to pharmacy. Resume previous medications. Dilatation diet given. YOU HAD AN ENDOSCOPIC PROCEDURE TODAY AT [facility]:   Refer to the procedure report that was given to you for any specific questions about what was found during the examination.  If the procedure report does not answer your questions, please call your gastroenterologist to clarify.  If you requested that your care partner not be given the details of your procedure findings, then the procedure report has been included in a sealed envelope for you to review at your convenience later.  YOU SHOULD EXPECT: Some feelings of bloating in the abdomen. Passage of more gas than usual.  Walking can help get rid of the air that was put into your GI tract during the procedure and reduce the bloating. If you had a lower endoscopy (such as a colonoscopy or flexible sigmoidoscopy) you may notice spotting of blood in your stool or on the toilet paper. If you underwent a bowel prep for your procedure, you may not have a normal bowel movement for a few days.  Please Note:  You might notice some irritation and congestion in your nose or some drainage.  This is from the oxygen used during your procedure.  There is no need for concern and it should clear up in a day or so.  SYMPTOMS TO REPORT IMMEDIATELY:    Following upper endoscopy (EGD)  Vomiting of blood or coffee ground material  New chest pain or pain under the shoulder blades  Painful or persistently difficult swallowing  New shortness of breath  Fever of 100F or higher  Black, tarry-looking stools  For urgent or emergent issues, a gastroenterologist can be reached at any hour by calling [phone].   DIET:  We do recommend a small meal at first, but then you may proceed to your regular diet.  Drink plenty of fluids but you should avoid alcoholic beverages for 24 hours.  ACTIVITY:  You should plan to take it easy for the  rest of today and you should NOT DRIVE or use heavy machinery until tomorrow (because of the sedation medicines used during the test).    FOLLOW UP: Our staff will call the number listed on your records the next business day following your procedure to check on you and address any questions or concerns that you may have regarding the information given to you following your procedure. If we do not reach you, we will leave a message.  However, if you are feeling well and you are not experiencing any problems, there is no need to return our call.  We will assume that you have returned to your regular daily activities without incident.  If any biopsies were taken you will be contacted by phone or by letter within the next 1-3 weeks.  Please call us at [phone] if you have not heard about the biopsies in 3 weeks.    SIGNATURES/CONFIDENTIALITY: You and/or your care partner have signed paperwork which will be entered into your electronic medical record.  These signatures attest to the fact that that the information above on your After Visit Summary has been reviewed and is understood.  Full responsibility of the confidentiality of this discharge information lies with you and/or your care-partner.

## 2018-09-24 NOTE — Progress Notes (Signed)
Pt's states no medical or surgical changes since previsit or office visit.  Pt has Hickman port

## 2018-09-24 NOTE — Progress Notes (Signed)
Spontaneous respirations throughout. VSS. Resting comfortably. To PACU on room air. Report to RN. IV changed to #22 L hand mid-procedure.

## 2018-09-24 NOTE — Progress Notes (Signed)
Kenalog 40mg  /1cc injected into 4 esophageal sites (0.25cc Kenalog per each of 4 sites)with scelero needle by Dr [name]

## 2018-09-24 NOTE — Op Note (Signed)
[facility] Patient Name: Darrell Cunningham Procedure Date: 09/24/2018 10:18 AM MRN: [id number] Endoscopist: [name] , MD Age: 58 Referring MD:  Date of Birth: [birth year] Gender: Male Account #: [id number] Procedure:                Upper GI endoscopy Indications:              Dysphagia, Management of operative complication:                            Dilation of anastomotic stricture, last dilation                            08/19/2018, previous dilation with intralesional                            steroid injection 07/04/2018 Medicines:                Monitored Anesthesia Care Procedure:                Pre-Anesthesia Assessment:                           - Prior to the procedure, a History and Physical                            was performed, and patient medications and                            allergies were reviewed. The patient's tolerance of                            previous anesthesia was also reviewed. The risks                            and benefits of the procedure and the sedation                            options and risks were discussed with the patient.                            All questions were answered, and informed consent                            was obtained. Prior Anticoagulants: The patient has                            taken no previous anticoagulant or antiplatelet                            agents. ASA Grade Assessment: III - A patient with                            severe systemic disease. After reviewing the risks  and benefits, the patient was deemed in                            satisfactory condition to undergo the procedure.                           After obtaining informed consent, the endoscope was                            passed under direct vision. Throughout the                            procedure, the patient's blood pressure, pulse, and                            oxygen saturations were  monitored continuously. The                            Model GIF-[id number]) scope was introduced                            through the mouth, and advanced to the second part                            of duodenum. The upper GI endoscopy was                            accomplished without difficulty. The patient                            tolerated the procedure well. Scope In: Scope Out: Findings:                 Patchy, white plaques were found in the upper third                            of the esophagus.                           One benign-appearing, intrinsic moderate                            (circumferential scarring or stenosis; an endoscope                            may pass) stenosis was found at the esophageal                            anastomosis. Overall the stenosis is much improved                            compared to initial narrowing. This stenosis                            measured less than one cm (in length). The  stenosis                            was traversed. A TTS dilator was passed through the                            scope. Dilation with an 18-19-20 mm balloon dilator                            was performed to 19 mm. The dilation site was                            examined and showed moderate mucosal disruption.                            Estimated blood loss was minimal. Area was                            successfully injected in 4 quadrants with 1 mL                            total of triamcinolone (40 mg/mL) for drug                            delivery. Total triamcinolone dose 40 mg (10 mg in                            each quadrant).                           Evidence of a previous surgical anastomosis was                            found in the gastric body. This was characterized                            by visible sutures.                           The exam of the stomach was otherwise normal.                           The  examined duodenum was normal. Complications:            No immediate complications. Estimated Blood Loss:     Estimated blood loss was minimal. Impression:               - Esophageal plaques were found, consistent with                            candidiasis.                           - Benign-appearing esophageal stenosis. Dilated.  Injected.                           - A previous surgical anastomosis was found,                            characterized by visible sutures.                           - Normal examined duodenum.                           - No specimens collected. Recommendation:           - Patient has a contact number available for                            emergencies. The signs and symptoms of potential                            delayed complications were discussed with the                            patient. Return to normal activities tomorrow.                            Written discharge instructions were provided to the                            patient.                           - Advance diet as tolerated.                           - Continue present medications.                           - Add liquid fluconazole 200 mg x 1 day, 100 mg x                            13 days.                           - Repeat upper endoscopy PRN for retreatment. [name], MD 09/24/2018 10:58:16 AM This report has been signed electronically.

## 2018-09-25 ENCOUNTER — Telehealth: Payer: Self-pay

## 2018-09-25 NOTE — Telephone Encounter (Signed)
Left message on f/u call 

## 2018-09-25 NOTE — Telephone Encounter (Signed)
  Follow up Call-  Call back number 09/24/2018 08/19/2018 07/04/2018 06/03/2018 04/15/2018 02/28/2018 01/21/2018  Post procedure Call Back phone  # [phone]  Permission to leave phone message Yes Yes Yes Yes Yes Yes Yes  Some recent data might be hidden     Patient questions:  Do you have a fever, pain , or abdominal swelling? No. Pain Score  0 *  Have you tolerated food without any problems? Yes.    Have you been able to return to your normal activities? Yes.    Do you have any questions about your discharge instructions: Diet   No. Medications  No. Follow up visit  No.  Do you have questions or concerns about your Care? No.  Actions: * If pain score is 4 or above: No action needed, pain <4.

## 2018-09-26 ENCOUNTER — Ambulatory Visit: Payer: Medicaid Other | Admitting: Cardiothoracic Surgery

## 2018-09-26 VITALS — BP 132/85 | HR 83 | Resp 20 | Ht 60.0 in | Wt 131.0 lb

## 2018-09-26 DIAGNOSIS — C159 Malignant neoplasm of esophagus, unspecified: Secondary | ICD-10-CM | POA: Diagnosis not present

## 2018-09-26 NOTE — Progress Notes (Signed)
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 PROCEDURES PERFORMED:  Bronchoscopy, transhiatal total esophagectomy with cervical esophagogastrostomy, pyloromyotomy, and feeding jejunostomy tube. SURGEON:  [name], MD.  Cancer Staging Malignant neoplasm of lower third of esophagus [facility]) Staging form: Esophagus - Adenocarcinoma, AJCC 8th Edition - Clinical stage from 10/26/2016: Stage III (cT3, cN1, cM0) - Signed by [name], MD on 11/05/2016 - Pathologic stage from 04/20/2017: Stage I (ypT0, pN0, cM0, GX) - Signed by [name], MD on 04/23/2017   History of Present Illness:      Patient returns to the office today for postop check after  Esophagectomy September 2018.  The patient has had to have repeat dilatations of benign anastomotic stricture, most recently this was dilated to 19 mm.  The patient notes that the frequency of the time between dilatations has decreased.  He is able to take a p.o. diet without difficulty.  He denies any dumping syndrome.  He does note that if he eats fast or too much she will become bloated.  He has had some reflux especially if he lays flat at night, he has been careful to not eat close to bedtime and to keep the head of his bed elevated.   Zubrod Score: At the time of surgery this patient's most appropriate activity status/level should be described as: [x]     0    Normal activity, no symptoms []     1    Restricted in physical strenuous activity but ambulatory, able to do  out light work []     2    Ambulatory and capable of self care, unable to do work activities, up and about                 >50 % of waking hours                                                                                   []     3    Only limited self care, in bed greater than 50% of waking hours []     4    Completely disabled, no self care, confined to bed or chair []     5    Moribund  Social History   Tobacco Use  Smoking Status Former Smoker  . Packs/day: 1.00  . Years: 38.00  . Pack years: 38.00  . Types: Cigarettes  . Start date: 10/31/2012  . Last attempt to quit: 10/31/2012  . Years since quitting: 5.9  Smokeless Tobacco Never Used       Allergies  Allergen Reactions  . Penicillins Hives, Itching  and Swelling    Has patient had a PCN reaction causing immediate rash, facial/tongue/throat swelling, SOB or lightheadedness with hypotension: No Has patient had a PCN reaction causing severe rash involving mucus membranes or skin necrosis: No Has patient had a PCN reaction that required hospitalization No Has patient had a PCN reaction occurring within the last 10 years: No If all of the above answers are "NO", then may proceed with Cephalosporin use. Unknown Childhood reaction    Current Outpatient Medications  Medication Sig Dispense Refill  . acetaminophen (TYLENOL) 160 MG/5ML solution Take 10.2 mLs (325 mg total) by mouth every 4 (four) hours as needed for moderate pain. 120 mL 0  . albuterol (ACCUNEB) 0.63 MG/3ML nebulizer solution Take 1 ampule by nebulization every 6 (six) hours as needed for wheezing.    Marland Kitchen CARAFATE 1 GM/10ML suspension TAKE 10 MLS BY MOUTH 4 TIMES A DAY BEFORE MEALS AND AT BEDTIME 1200 mL 2  . cyclobenzaprine (FLEXERIL) 5 MG tablet TAKE 1 TABLET BY MOUTH THREE TIMES A DAY AS NEEDED FOR MUSCLE SPASMS 30 tablet 0  . Electrolyte-S (ISOLYTE-S IV) Inject into the vein. Drinks orally use to use through feeding tube.    . fluconazole (DIFLUCAN) 10 MG/ML  suspension Take 200 mg x 1 day and then 100 mg x 13 days. 150 mL 0  . fluticasone (FLONASE) 50 MCG/ACT nasal spray Place 1 spray into both nostrils daily as needed for allergies or rhinitis.    . fluticasone (FLOVENT DISKUS) 50 MCG/BLIST diskus inhaler Inhale into the lungs.    . lidocaine-prilocaine (EMLA) cream APPLY TO PORT-A-CATH SITE 1.5 TO 2 HOURS PRIOR TO PROCEDURE AS NEEDED 30 g 1  . pantoprazole (PROTONIX) 40 MG tablet TAKE 1 TABLET BY MOUTH TWICE A DAY 60 tablet 11   No current facility-administered medications for this visit.     Physical Exam: BP 132/85   Pulse 83   Resp 20   Ht 5' (1.524 m)   Wt 131 lb (59.4 kg)   SpO2 98% Comment: RA  BMI 25.58 kg/m    General appearance: alert and cooperative Head: Normocephalic, without obvious abnormality, atraumatic Lymph nodes: Cervical, supraclavicular, and axillary nodes normal. Resp: clear to auscultation bilaterally Cardio: regular rate and rhythm, S1, S2 normal, no murmur, click, rub or gallop GI: soft, non-tender; bowel sounds normal; no masses,  no organomegaly Extremities: extremities normal, atraumatic, no cyanosis or edema and Homans sign is negative, no sign of DVT Neurologic: Grossly normal Abdominal incision and left neck incision are well-healed   Diagnostic Studies & Laboratory data:         Recent Radiology Findings: CLINICAL DATA:  Esophageal cancer, chemotherapy and radiation therapy complete.  EXAM: CT CHEST and abdomen WITH CONTRAST  TECHNIQUE: Multidetector CT imaging of the chest and abdomen was performed following the standard protocol during bolus administration of intravenous contrast.  CONTRAST:  140mL OMNIPAQUE IOHEXOL 300 MG/ML  SOLN  COMPARISON:  CT chest abdomen 09/11/2017.  FINDINGS: CT CHEST FINDINGS  Cardiovascular: Right IJ Port-A-Cath terminates in the right atrium. Atherosclerotic calcification of the arterial vasculature, including coronary arteries. Heart size normal.  Small pericardial effusion is stable.  Mediastinum/Nodes: Mediastinal lymph nodes are not enlarged by CT size criteria and are stable. No hilar or axillary adenopathy. Esophagectomy and gastric pull-through.  Lungs/Pleura: Mild paraseptal emphysema. Irregular consolidation in the medial aspect of the right lower lobe (series 7, image 105), unchanged. Lungs are otherwise clear. No pleural fluid. Airway is unremarkable.  Musculoskeletal: No  worrisome lytic or sclerotic lesions.  CT ABDOMEN FINDINGS  Hepatobiliary: Liver and gallbladder are unremarkable. No biliary ductal dilatation.  Pancreas: Negative.  Spleen: Negative.  Adrenals/Urinary Tract: Adrenal glands and kidneys are unremarkable.  Stomach/Bowel: Gastric pull-through. Visualized portions of the small bowel, appendix and colon are unremarkable.  Vascular/Lymphatic: Atherosclerotic calcification of the arterial vasculature without abdominal aortic aneurysm. Lymph node at the diaphragmatic hiatus measures 8 mm (series 2, image 51), unchanged.  Other: No free fluid.  Mesenteries and peritoneum are unremarkable.  Musculoskeletal: No worrisome lytic or sclerotic lesions.  IMPRESSION: 1. No evidence of metastatic disease. 2. Aortic atherosclerosis (ICD10-170.0). Coronary artery calcification. 3.  Emphysema (ICD10-J43.9).   Electronically Signed   By: Lorin Picket M.D.   On: 04/10/2018 15:07  I have independently reviewed the above radiology studies  and reviewed the findings with the patient.  Recent Labs: Lab Results  Component Value Date   WBC 5.0 04/10/2018   HGB 13.1 04/10/2018   HCT 39.1 04/10/2018   PLT 153 04/10/2018   GLUCOSE 96 04/10/2018   CHOL 157 10/27/2014   TRIG 83 10/27/2014   HDL 34 (L) 10/27/2014   LDLCALC 106 (H) 10/27/2014   ALT 17 04/10/2018   AST 18 04/10/2018   NA 142 04/10/2018   K 4.0 04/10/2018   CL 108 04/10/2018   CREATININE 0.69 04/10/2018   BUN 14  04/10/2018   CO2 26 04/10/2018   INR 0.91 04/12/2017   HGBA1C 5.7 (H) 10/27/2014   Wt Readings from Last 3 Encounters:  09/26/18 131 lb (59.4 kg)  09/24/18 131 lb (59.4 kg)  08/19/18 132 lb (59.9 kg)      Assessment / Plan:   Patient stable after transhiatal total esophagectomyCancer Staging Malignant neoplasm of lower third of esophagus ([facility]) Staging form: Esophagus - Adenocarcinoma, AJCC 8th Edition - Clinical stage from 10/26/2016: Stage III (cT3, cN1, cM0) - Signed by [name], MD on 11/05/2016 - Pathologic stage from 04/20/2017: Stage I (ypT0, pN0, cM0, GX) - Signed by [name], MD on 04/23/2017  So far has no evidence of recurrent disease Is cervical anastomosis, with benign stricture has been managed with serial dilatations, this seems to be improving  Plan to see him back in 6 months   [name] 09/26/2018 10:02 AM

## 2018-10-11 ENCOUNTER — Other Ambulatory Visit: Payer: Self-pay

## 2018-10-11 ENCOUNTER — Ambulatory Visit: Payer: Medicaid Other | Admitting: Hematology

## 2018-10-11 ENCOUNTER — Other Ambulatory Visit: Payer: Medicaid Other

## 2018-10-11 ENCOUNTER — Inpatient Hospital Stay: Payer: Medicaid Other | Attending: Hematology

## 2018-10-11 DIAGNOSIS — Z452 Encounter for adjustment and management of vascular access device: Secondary | ICD-10-CM | POA: Diagnosis present

## 2018-10-11 DIAGNOSIS — C155 Malignant neoplasm of lower third of esophagus: Secondary | ICD-10-CM | POA: Insufficient documentation

## 2018-10-11 DIAGNOSIS — Z95828 Presence of other vascular implants and grafts: Secondary | ICD-10-CM

## 2018-10-11 MED ORDER — HEPARIN SOD (PORK) LOCK FLUSH 100 UNIT/ML IV SOLN
500.0000 [IU] | Freq: Once | INTRAVENOUS | Status: AC
  Administered 2018-10-11: 500 [IU]
  Filled 2018-10-11: qty 5

## 2018-10-11 MED ORDER — SODIUM CHLORIDE 0.9% FLUSH
10.0000 mL | Freq: Once | INTRAVENOUS | Status: AC
  Administered 2018-10-11: 10 mL
  Filled 2018-10-11: qty 10

## 2018-10-14 ENCOUNTER — Telehealth: Payer: Self-pay | Admitting: Internal Medicine

## 2018-10-14 NOTE — Telephone Encounter (Signed)
Pt inquired about biopsy results from EGD 2.25.20.

## 2018-10-14 NOTE — Telephone Encounter (Signed)
Discussed with pt that no biopsies were done at last procedure. Pt verbalized understanding.

## 2018-12-09 NOTE — Progress Notes (Signed)
[place]   Telephone:([phone]) [phone] Fax:(336) [phone]   Clinic Follow up Note   Patient Care Team: [name], MD as PCP - General (Family Medicine)  Date of Service:  12/11/2018  CHIEF COMPLAINT: Follow up esophageal adenocarcinoma   SUMMARY OF ONCOLOGIC HISTORY: Oncology History   Cancer Staging Esophageal cancer [name]) Staging form: Esophagus - Adenocarcinoma, AJCC 8th Edition - Clinical stage from 10/26/2016: Stage III (cT3, cN1, cM0) - Signed by [name], MD on 11/05/2016       Malignant neoplasm of lower third of esophagus ([place])   09/21/2016 - 09/21/2016 Hospital Admission    esophageal pain and vomiting up blood    10/09/2016 Initial Diagnosis    Esophageal cancer ([name])    10/09/2016 Procedure    EGD 1. Partially obstructing, likely malignant esophageal tumor was found in the lower third of the esophagus. Multiple biopsies.  2. Mass visible during gastric retroflexion at GE junction 3. Otherwise normal stomach 4. Normal examined duodenum     10/09/2016 Pathology Results    Esophagus, biopsy, distal esophageal tumor (33-39) - SUSPICIOUS FOR ADENOCARCINOMA    10/12/2016 Imaging    CT CAP w Contrast IMPRESSION: Distal esophageal mass compatible with primary esophageal malignancy. There are 2 adjacent abnormal appearing subcentimeter paraesophageal lymph nodes which may represent nodal metastasis. Additionally there is a prominent nonspecific 8 mm upper abdominal lymph node.  No evidence for distant metastatic disease in the chest, abdomen or pelvis.    10/24/2016 PET scan    1. Markedly hypermetabolic distal esophageal lesion, compatible with malignancy. Adjacent small paraesophageal lymph nodes are abnormal by CT but cannot be resolved as separate structures from the hypermetabolic esophageal activity on the PET images. No hypermetabolism is demonstrated in the upper abdominal/gastrohepatic ligament lymph node although the small size of  this lymph node may be below threshold for detection on PET imaging. 2. No evidence for distant hypermetabolic metastatic disease in the neck, chest, abdomen, or pelvis.    10/26/2016 Pathology Results    Esophagogastric junction, biopsy, mass - INVASIVE ADENOCARCINOMA.    10/26/2016 Procedure    EUS showed uT3N1 disease, and biopsy confirmed adenocarcinoma     10/30/2016 - 12/07/2016 Radiation Therapy    Neoadjuvant radiation to esophageal cancer  Under the care of Dr. [name]    10/31/2016 - 12/07/2016 Neo-Adjuvant Chemotherapy    Weekly Carboplatin AUC 2 and taxol 50mg /m2 with concurrent radiation      12/14/2016 Imaging    CT AP W Contrast 12/14/16 IMPRESSION: 1. Mildly dilated short segment of proximal jejunum without bowel wall thickening or significant inflammatory changes. Findings may represent focal ileus or radiation enteritis. No evidence for obstruction. 2. Significantly improved appearance of the distal esophagus/proximal stomach with decreased wall thickening (1.1 cm from 2.3 cm), and smaller paraesophageal and gastrohepatic ligament lymph nodes. 3. No bowel obstruction.  Normal appendix.    01/18/2017 Imaging    CT CAP W Contrast 01/18/17 IMPRESSION: 1. Mild stable distal esophageal wall thickening likely due to radiation change. No findings for recurrent tumor. Small paraesophageal lymph nodes are also stable. 2. No findings for metastatic disease. 3. Stable mild/early emphysematous changes and age advanced atherosclerotic calcifications involving the thoracic and abdominal aorta and branch vessels.    04/12/2017 Imaging    CT Chest and Abdomen W Contrast 04/12/17  IMPRESSION: 1. Mild stable distal esophageal wall thickening. No findings for recurrent tumor. 2. Stable small mediastinal and left supraclavicular lymph nodes. 3. There is a tiny  nodule within the medial right lower lobe measuring 3 mm. New from previous exam. Nonspecific. Attention on follow-up imaging  advise. 4.  Aortic Atherosclerosis (ICD10-I70.0). 5. Three vessel coronary artery calcification    04/16/2017 Surgery    VIDEO BRONCHOSCOPY, Transhiatal Total Esophagectomy, Esophagogastrostomy, pyloromotomy, Feeding Jejunostomy ESOPHAGECTOMY COMPLETE,Transhiatal total esophagectomy JEJUNOSTOMY,Feeding by Dr. [name] 04/16/17    04/16/2017 Pathology Results    Diagnosis 04/16/17  1. Omentum, resection for tumor - BENIGN ADIPOSE TISSUE CONSISTENT WITH OMENTUM. - NO EVIDENCE OF MALIGNANCY. 2. Esophagus, resection, w/ GE junction - FIBROSIS WITH PATCHY CHRONIC INFLAMMATION. - NO RESIDUAL CARCINOMA IDENTIFIED. - MARGINS NOT INVOLVED. - TWELVE LYMPH NODES WITH NO METASTATIC CARCINOMA (0/12).     07/16/2017 Procedure    Upper GI Endoscopy Findings: per Dr. [name] - Food was found in the upper third of the esophagus. Removal of food was accomplished with Jabier Mutton net. - A partial esophagectomy anastomosis was found in the upper third of the esophagus (20 cm from incisors). This was characterized by severe stenosis, an intact staple line and visible sutures. The standard adult upper endoscope would not pass before dilation. A TTS dilator was passed through the scope. Dilation with an 8.5-9.5-10.5 mm and then 06-11-12 mm balloon dilator was performed to 12 mm (inspection after 9.5 mm, 10.5 mm, 11 mm and 12 mm). The dilation site was examined and showed moderate and significant improvement in luminal narrowing. Estimated blood loss was minimal. After dilation to 12 mm the endoscope was able to transverse the anastomosis with minimal pressure. - A medium amount of food (residue) was found in the gastric body. - The exam of the stomach was otherwise normal. - The examined duodenum was normal.    08/13/2017 Procedure    Upper GI Endoscopy Findings: per Dr. [name] - Food was found in the proximal esophagus. Removal of food was accomplished with Jabier Mutton net. - One severe (stenosis; an endoscope cannot pass)  benign-appearing, intrinsic stenosis was found 20 cm from the incisors. This measured 1 cm (in length) and was traversed after dilation. A TTS dilator was passed through the scope. Dilation with an 06-11-12 mm balloon dilator was performed to 13 mm (after 2mm and 12 mm). The dilation site was examined and showed moderate improvement in luminal narrowing. At the stricture there is a visible staple on the proximal side and visible suture material on the gastric side. - A medium amount of food (residue) -the duodenum was normal     08/31/2017 Procedure    Endoscopic needle-knife excision of anastomotic stricture by Dr. Lysle Rubens at [facility]    09/11/2017 Imaging    CT CAP W CONTRAST IMPRESSION: 1. Interval esophagectomy with gastric pull-through. No demonstrated complication. There is retained ingested material within the intrathoracic portion of the stomach. 2. No evidence of local recurrence or metastatic disease. 3. Stable small left supraclavicular and superior mediastinal lymph nodes. 4. Mild lower lobe paramediastinal pulmonary opacity bilaterally, likely atelectasis or sequela of prior radiation therapy. 5.  Aortic Atherosclerosis (ICD10-I70.0).     02/28/2018 Procedure    02/28/2018 Upper endoscopy Impression - Benign-appearing esophageal stenosis. Dilated. - A previous surgical anastomosis was found in the proximal stomach. - Normal examined duodenum. - No specimens collected.    04/10/2018 Imaging    04/10/2018 CT CA IMPRESSION: 1. No evidence of metastatic disease. 2. Aortic atherosclerosis (ICD10-170.0). Coronary artery calcification. 3.  Emphysema (ICD10-J43.9).      CURRENT THERAPY:  Surveillance  INTERVAL HISTORY:  KHALI ALBANESE is here for a  follow up of esophogeal cancer. He was last seen by me 8 months ago. He presents to the clinic today by himself. He notes he is doing well. He notes right axillary skin erythema more than his left. He notes he has had this since his  cancer diagnosis. He was taking a antifungal cream under his arm after shower. He notes upper midline soreness from incision (1/10). He notes his appetite is improving. He notes he received 3-4  injections due to fungal infection in esophagus. He was not sure when he was getting full and may end up vomiting due to this. He denies dysphagia or odynophagia currently.     REVIEW OF SYSTEMS:   Constitutional: Denies fevers, chills or abnormal weight loss Eyes: Denies blurriness of vision Ears, nose, mouth, throat, and face: Denies mucositis or sore throat Respiratory: Denies cough, dyspnea or wheezes Cardiovascular: Denies palpitation, chest discomfort or lower extremity swelling Gastrointestinal:  Denies nausea, heartburn or change in bowel habits (+) upper midline soreness  Skin: Denies abnormal skin rashes (+) Skin erythema of right axilla more than left.  Lymphatics: Denies new lymphadenopathy or easy bruising Neurological:Denies numbness, tingling or new weaknesses Behavioral/Psych: Mood is stable, no new changes  All other systems were reviewed with the patient and are negative.  MEDICAL HISTORY:  Past Medical History:  Diagnosis Date  . Allergy   . Arthritis   . Asthma    as a child  . Cancer ([place]) 10/09/2016   ESOPHAGUS CARCINOMA   . COPD (chronic obstructive pulmonary disease) ([place])   . 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 Dysphagia   . Emphysema of lung ([place])   . Encounter for nonprocreative genetic counseling 10/31/2016   Mr. Andon underwent genetic counseling for hereditary cancer syndromes on 10/31/2016. Though he is a candidate for genetic testing, he declines at this time.  Marland Kitchen GERD (gastroesophageal reflux disease)   . Hypertension   . Neuromuscular disorder ([place])   . Pneumonia 4 yrs ago    SURGICAL HISTORY: Past Surgical History:  Procedure Laterality Date  . COLONOSCOPY    . COMPLETE ESOPHAGECTOMY N/A 04/16/2017   Procedure: ESOPHAGECTOMY  COMPLETE,Transhiatal total esophagectomy;  Surgeon: [name], MD;  Location: [facility] OR;  Service: Thoracic;  Laterality: N/A;  . EUS N/A 10/26/2016   Procedure: UPPER ENDOSCOPIC ULTRASOUND (EUS) LINEAR;  Surgeon: [name], MD;  Location: [facility] ENDOSCOPY;  Service: Endoscopy;  Laterality: N/A;  . EYE SURGERY Bilateral age 68    for cross eyes  . IR FLUORO GUIDE PORT INSERTION RIGHT  11/08/2016   right upper chest  . IR US GUIDE VASC ACCESS RIGHT  11/08/2016  . JEJUNOSTOMY N/A 04/16/2017   Procedure: Donney Rankins;  Surgeon: [name], MD;  Location: [facility] OR;  Service: Thoracic;  Laterality: N/A;  . JEJUNOSTOMY     removal of feeding tube /pt unsure of date  . MOUTH SURGERY    . UPPER GASTROINTESTINAL ENDOSCOPY    . VIDEO BRONCHOSCOPY N/A 04/16/2017   Procedure: VIDEO BRONCHOSCOPY, Transhiatal Total Esophagectomy, Esophagogastrostomy, pyloromotomy, Feeding Jejunostomy;  Surgeon: [name], MD;  Location: [facility] OR;  Service: Thoracic;  Laterality: N/A;    I have reviewed the social history and family history with the patient and they are unchanged from previous note.  ALLERGIES:  is allergic to penicillins.  MEDICATIONS:  Current Outpatient Medications  Medication Sig Dispense Refill  . acetaminophen (TYLENOL) 160 MG/5ML solution Take 10.2 mLs (325 mg total) by  mouth every 4 (four) hours as needed for moderate pain. 120 mL 0  . albuterol (ACCUNEB) 0.63 MG/3ML nebulizer solution Take 1 ampule by nebulization every 6 (six) hours as needed for wheezing.    Marland Kitchen CARAFATE 1 GM/10ML suspension TAKE 10 MLS BY MOUTH 4 TIMES A DAY BEFORE MEALS AND AT BEDTIME 1200 mL 2  . cyclobenzaprine (FLEXERIL) 5 MG tablet TAKE 1 TABLET BY MOUTH THREE TIMES A DAY AS NEEDED FOR MUSCLE SPASMS 30 tablet 0  . Electrolyte-S (ISOLYTE-S IV) Inject into the vein. Drinks orally use to use through feeding tube.    . fluconazole (DIFLUCAN) 10 MG/ML suspension Take 200 mg x 1 day and then 100 mg x 13  days. 150 mL 0  . fluticasone (FLONASE) 50 MCG/ACT nasal spray Place 1 spray into both nostrils daily as needed for allergies or rhinitis.    . fluticasone (FLOVENT DISKUS) 50 MCG/BLIST diskus inhaler Inhale into the lungs.    . lidocaine-prilocaine (EMLA) cream APPLY TO PORT-A-CATH SITE 1.5 TO 2 HOURS PRIOR TO PROCEDURE AS NEEDED 30 g 1  . pantoprazole (PROTONIX) 40 MG tablet TAKE 1 TABLET BY MOUTH TWICE A DAY 60 tablet 11  . nystatin (MYCOSTATIN/NYSTOP) powder Apply topically 3 (three) times daily. 1 apply to right axilla 30 g 0   No current facility-administered medications for this visit.     PHYSICAL EXAMINATION: ECOG PERFORMANCE STATUS: 0 - Asymptomatic  Vitals:   12/11/18 1021  BP: 134/82  Pulse: 79  Resp: 18  Temp: 98.2 F (36.8 C)  SpO2: 99%   Filed Weights   12/11/18 1021  Weight: 136 lb 12.8 oz (62.1 kg)    GENERAL:alert, no distress and comfortable SKIN: skin color, texture, turgor are normal, no rashes or significant lesions (+) skin erythema of b/l axilla, R>L, mild tenderness EYES: normal, Conjunctiva are pink and non-injected, sclera clear OROPHARYNX:no exudate, no erythema and lips, buccal mucosa, and tongue normal  NECK: supple, thyroid normal size, non-tender, without nodularity LYMPH:  no palpable lymphadenopathy in the cervical, axillary or inguinal LUNGS: clear to auscultation and percussion with normal breathing effort HEART: regular rate & rhythm and no murmurs and no lower extremity edema ABDOMEN:abdomen soft, non-tender and normal bowel sounds Musculoskeletal:no cyanosis of digits and no clubbing  NEURO: alert & oriented x 3 with fluent speech, no focal motor/sensory deficits  LABORATORY DATA:  I have reviewed the data as listed CBC Latest Ref Rng & Units 12/11/2018 04/10/2018 01/11/2018  WBC 4.0 - 10.5 K/uL 5.8 5.0 3.5(L)  Hemoglobin 13.0 - 17.0 g/dL 14.0 13.1 12.5(L)  Hematocrit 39.0 - 52.0 % 42.7 39.1 37.3(L)  Platelets 150 - 400 K/uL 176 153 150      CMP Latest Ref Rng & Units 12/11/2018 04/10/2018 01/11/2018  Glucose 70 - 99 mg/dL 107(H) 96 103  BUN 6 - 20 mg/dL 18 14 15   Creatinine 0.61 - 1.24 mg/dL 0.73 0.69 0.65(L)  Sodium 135 - 145 mmol/L 140 142 140  Potassium 3.5 - 5.1 mmol/L 4.3 4.0 3.9  Chloride 98 - 111 mmol/L 106 108 107  CO2 22 - 32 mmol/L 26 26 25   Calcium 8.9 - 10.3 mg/dL 9.0 9.1 9.2  Total Protein 6.5 - 8.1 g/dL 7.3 6.8 6.5  Total Bilirubin 0.3 - 1.2 mg/dL 0.3 0.3 0.4  Alkaline Phos 38 - 126 U/L 94 123 144  AST 15 - 41 U/L 19 18 30   ALT 0 - 44 U/L 23 17 57(H)      RADIOGRAPHIC STUDIES:  I have personally reviewed the radiological images as listed and agreed with the findings in the report. No results found.   ASSESSMENT & PLAN:  Darrell Cunningham is a 58 y.o. male with   1. Low esophageal Adenocarcinoma, cT3N1M0, stage III, ypT0N0  -He was diagnosed in 09/2016. He is s/p neoadjuvant ChemoRT and esophagectomy.  -This does not rule out microscopic disease and the risk of recurrence.  I recommended cancer surveillance for 5 years. -He is clinically doing well. He has fungal infection of b/l axilla. I will call in Nystatin powder. He has had esophogeal stretched as needed. No dysphagia or odynophagia currently. Labs reviewed, CBC and CMP WNL, except BG 107. CEA is still pending. Physical exam unremarkable. There is no clinical concern for recurrence.  -He has had multiple EGD for dilatation, last in 08/2018, no evidence of local recurrence on EGD. Given COVID-19 I suggest he wait another 2-3 months before repeat Endoscopy.  -He has PAC still in place. I discussed if next scan is clear, he should be fine to remove port. He is agreeable. Continue Port flush every 8 weeks for now.  -Continue surveillance. He is 2 years since diagnoses. Next CT scan in mid august before f/u with Dr [name].  -F/u in 6 months    2. History of heavy smoking -He has quit smoking completely, history of 40 pack year.   3.  Malnutrition and weight loss  -His nutrician and eating has much improved. He has had an esophogeal stretch and manages eating well with the help of Sulcrafate.  -Appetite is adequate, with weight gain   4. Esophageal stricture  -Secondary to previous surgery and radiation -Follow-up with GI Dr. [name] for dilatation as needed   5. Right axillary fungal infection  -I will call in Nystatin powder   Plan: -I called in Nystatin Powder -CT CAP with contrast in mid August 2020 before his appointment with Dr. [name]  -Lab and f/u in 6 months  -Port flush in 2 months     No problem-specific Assessment & Plan notes found for this encounter.   Orders Placed This Encounter  Procedures  . CT Abdomen Pelvis W Contrast    Standing Status:   Future    Standing Expiration Date:   12/11/2019    Order Specific Question:   If indicated for the ordered procedure, I authorize the administration of contrast media per Radiology protocol    Answer:   Yes    Order Specific Question:   Preferred imaging location?    Answer:   [facility]    Order Specific Question:   Is Oral Contrast requested for this exam?    Answer:   Yes, Per Radiology protocol    Order Specific Question:   Radiology Contrast Protocol - do NOT remove file path    Answer:   \\[email or web address]  . CT Chest W Contrast    Standing Status:   Future    Standing Expiration Date:   12/11/2019    Order Specific Question:   If indicated for the ordered procedure, I authorize the administration of contrast media per Radiology protocol    Answer:   Yes    Order Specific Question:   Preferred imaging location?    Answer:   [facility]    Order Specific Question:   Radiology Contrast Protocol - do NOT remove file path    Answer:   \\[email or web address]   All questions were answered. The patient knows to call  the clinic with any problems, questions or concerns. No barriers  to learning was detected. I spent 20 minutes counseling the patient face to face. The total time spent in the appointment was 25 minutes and more than 50% was on counseling and review of test results     [name], MD 12/11/2018   I, Joslyn Devon, am acting as scribe for [name], MD.   I have reviewed the above documentation for accuracy and completeness, and I agree with the above.

## 2018-12-11 ENCOUNTER — Telehealth: Payer: Self-pay | Admitting: Hematology

## 2018-12-11 ENCOUNTER — Encounter: Payer: Self-pay | Admitting: Hematology

## 2018-12-11 ENCOUNTER — Inpatient Hospital Stay: Payer: Medicaid Other

## 2018-12-11 ENCOUNTER — Inpatient Hospital Stay (HOSPITAL_BASED_OUTPATIENT_CLINIC_OR_DEPARTMENT_OTHER): Payer: Medicaid Other | Admitting: Hematology

## 2018-12-11 ENCOUNTER — Other Ambulatory Visit: Payer: Self-pay

## 2018-12-11 ENCOUNTER — Inpatient Hospital Stay: Payer: Medicaid Other | Attending: Hematology

## 2018-12-11 VITALS — BP 134/82 | HR 79 | Temp 98.2°F | Resp 18 | Ht 60.0 in | Wt 136.8 lb

## 2018-12-11 DIAGNOSIS — K219 Gastro-esophageal reflux disease without esophagitis: Secondary | ICD-10-CM

## 2018-12-11 DIAGNOSIS — I10 Essential (primary) hypertension: Secondary | ICD-10-CM | POA: Insufficient documentation

## 2018-12-11 DIAGNOSIS — E46 Unspecified protein-calorie malnutrition: Secondary | ICD-10-CM | POA: Insufficient documentation

## 2018-12-11 DIAGNOSIS — Z95828 Presence of other vascular implants and grafts: Secondary | ICD-10-CM

## 2018-12-11 DIAGNOSIS — Z87891 Personal history of nicotine dependence: Secondary | ICD-10-CM | POA: Diagnosis not present

## 2018-12-11 DIAGNOSIS — C155 Malignant neoplasm of lower third of esophagus: Secondary | ICD-10-CM | POA: Diagnosis not present

## 2018-12-11 DIAGNOSIS — Z7951 Long term (current) use of inhaled steroids: Secondary | ICD-10-CM | POA: Diagnosis not present

## 2018-12-11 DIAGNOSIS — Z79899 Other long term (current) drug therapy: Secondary | ICD-10-CM

## 2018-12-11 DIAGNOSIS — K222 Esophageal obstruction: Secondary | ICD-10-CM

## 2018-12-11 DIAGNOSIS — Z923 Personal history of irradiation: Secondary | ICD-10-CM | POA: Insufficient documentation

## 2018-12-11 LAB — CMP (CANCER CENTER ONLY)
ALT: 23 U/L (ref 0–44)
AST: 19 U/L (ref 15–41)
Albumin: 3.4 g/dL — ABNORMAL LOW (ref 3.5–5.0)
Alkaline Phosphatase: 94 U/L (ref 38–126)
Anion gap: 8 (ref 5–15)
BUN: 18 mg/dL (ref 6–20)
CO2: 26 mmol/L (ref 22–32)
Calcium: 9 mg/dL (ref 8.9–10.3)
Chloride: 106 mmol/L (ref 98–111)
Creatinine: 0.73 mg/dL (ref 0.61–1.24)
GFR, Est AFR Am: >60 mL/min (ref >60)
GFR, Estimated: >60 mL/min (ref >60)
Glucose, Bld: 107 mg/dL — ABNORMAL HIGH (ref 70–99)
Potassium: 4.3 mmol/L (ref 3.5–5.1)
Sodium: 140 mmol/L (ref 135–145)
Total Bilirubin: 0.3 mg/dL (ref 0.3–1.2)
Total Protein: 7.3 g/dL (ref 6.5–8.1)

## 2018-12-11 LAB — CBC WITH DIFFERENTIAL/PLATELET
Abs Immature Granulocytes: 0.02 10*3/uL (ref 0.00–0.07)
Basophils Absolute: 0 10*3/uL (ref 0.0–0.1)
Basophils Relative: 1 %
Eosinophils Absolute: 0.1 10*3/uL (ref 0.0–0.5)
Eosinophils Relative: 2 %
HCT: 42.7 % (ref 39.0–52.0)
Hemoglobin: 14 g/dL (ref 13.0–17.0)
Immature Granulocytes: 0 %
Lymphocytes Relative: 22 %
Lymphs Abs: 1.3 10*3/uL (ref 0.7–4.0)
MCH: 30.7 pg (ref 26.0–34.0)
MCHC: 32.8 g/dL (ref 30.0–36.0)
MCV: 93.6 fL (ref 80.0–100.0)
Monocytes Absolute: 0.6 10*3/uL (ref 0.1–1.0)
Monocytes Relative: 10 %
Neutro Abs: 3.7 10*3/uL (ref 1.7–7.7)
Neutrophils Relative %: 65 %
Platelets: 176 10*3/uL (ref 150–400)
RBC: 4.56 MIL/uL (ref 4.22–5.81)
RDW: 13.2 % (ref 11.5–15.5)
WBC: 5.8 10*3/uL (ref 4.0–10.5)
nRBC: 0 % (ref 0.0–0.2)

## 2018-12-11 LAB — CEA (IN HOUSE-CHCC): CEA (CHCC-In House): 1.63 ng/mL (ref 0.00–5.00)

## 2018-12-11 MED ORDER — NYSTATIN 100000 UNIT/GM EX POWD: Freq: Three times a day (TID) | CUTANEOUS | 0 refills | Status: DC

## 2018-12-11 MED ORDER — HEPARIN SOD (PORK) LOCK FLUSH 100 UNIT/ML IV SOLN
500.0000 [IU] | Freq: Once | INTRAVENOUS | Status: AC
  Administered 2018-12-11: 500 [IU]
  Filled 2018-12-11: qty 5

## 2018-12-11 MED ORDER — SODIUM CHLORIDE 0.9% FLUSH
10.0000 mL | Freq: Once | INTRAVENOUS | Status: AC
  Administered 2018-12-11: 10:00:00 10 mL
  Filled 2018-12-11: qty 10

## 2018-12-11 NOTE — Telephone Encounter (Signed)
Scheduled appt per 5/13 los. ° °A calendar will be mailed out. °

## 2018-12-12 ENCOUNTER — Telehealth: Payer: Self-pay

## 2018-12-12 NOTE — Telephone Encounter (Signed)
Spoke with patient regarding lab results,  Per Dr. Burr Medico CEA is normal, no concerns, patient verbalized an understanding.

## 2018-12-12 NOTE — Telephone Encounter (Signed)
-----   Message from [name], MD sent at 12/12/2018  8:31 AM EDT ----- Please let pt know his lab results, CEA normal, no concerns, thanks   [name]  12/12/2018

## 2019-01-03 DIAGNOSIS — H40033 Anatomical narrow angle, bilateral: Secondary | ICD-10-CM | POA: Diagnosis not present

## 2019-01-03 DIAGNOSIS — H2513 Age-related nuclear cataract, bilateral: Secondary | ICD-10-CM | POA: Diagnosis not present

## 2019-01-07 ENCOUNTER — Other Ambulatory Visit: Payer: Self-pay | Admitting: Family Medicine

## 2019-01-07 MED ORDER — CYCLOBENZAPRINE HCL 5 MG PO TABS: 5.0000 mg | ORAL_TABLET | Freq: Three times a day (TID) | ORAL | 0 refills | Status: DC | PRN

## 2019-01-07 NOTE — Telephone Encounter (Signed)
Patient asking for refill on his flexeril [facility]

## 2019-01-07 NOTE — Telephone Encounter (Signed)
Requesting refill    Flexeril  LOV: 10/05/17  LRF:  08/22/18

## 2019-01-19 DIAGNOSIS — H5213 Myopia, bilateral: Secondary | ICD-10-CM | POA: Diagnosis not present

## 2019-02-10 ENCOUNTER — Other Ambulatory Visit: Payer: Self-pay

## 2019-02-10 ENCOUNTER — Inpatient Hospital Stay: Payer: Medicaid Other | Attending: Hematology

## 2019-02-10 DIAGNOSIS — Z452 Encounter for adjustment and management of vascular access device: Secondary | ICD-10-CM | POA: Insufficient documentation

## 2019-02-10 DIAGNOSIS — Z95828 Presence of other vascular implants and grafts: Secondary | ICD-10-CM

## 2019-02-10 DIAGNOSIS — C155 Malignant neoplasm of lower third of esophagus: Secondary | ICD-10-CM

## 2019-02-10 MED ORDER — SODIUM CHLORIDE 0.9% FLUSH
10.0000 mL | Freq: Once | INTRAVENOUS | Status: AC
  Administered 2019-02-10: 10 mL
  Filled 2019-02-10: qty 10

## 2019-02-10 MED ORDER — HEPARIN SOD (PORK) LOCK FLUSH 100 UNIT/ML IV SOLN
500.0000 [IU] | Freq: Once | INTRAVENOUS | Status: AC
  Administered 2019-02-10: 11:00:00 500 [IU]
  Filled 2019-02-10: qty 5

## 2019-03-07 ENCOUNTER — Telehealth: Payer: Self-pay | Admitting: Hematology

## 2019-03-07 NOTE — Telephone Encounter (Signed)
Scheduled appt per 8/05 sch message - pt aware of appt added

## 2019-03-20 ENCOUNTER — Other Ambulatory Visit: Payer: Self-pay

## 2019-03-20 ENCOUNTER — Ambulatory Visit (HOSPITAL_COMMUNITY)
Admission: RE | Admit: 2019-03-20 | Discharge: 2019-03-20 | Disposition: A | Discharge status: Home / Self Care | Payer: Medicaid Other | Source: Ambulatory Visit | Attending: Hematology | Admitting: Hematology

## 2019-03-20 ENCOUNTER — Inpatient Hospital Stay: Payer: Medicaid Other | Attending: Hematology

## 2019-03-20 ENCOUNTER — Encounter (HOSPITAL_COMMUNITY): Payer: Self-pay

## 2019-03-20 DIAGNOSIS — C155 Malignant neoplasm of lower third of esophagus: Secondary | ICD-10-CM | POA: Diagnosis not present

## 2019-03-20 DIAGNOSIS — J439 Emphysema, unspecified: Secondary | ICD-10-CM | POA: Diagnosis not present

## 2019-03-20 DIAGNOSIS — I313 Pericardial effusion (noninflammatory): Secondary | ICD-10-CM | POA: Diagnosis not present

## 2019-03-20 DIAGNOSIS — D638 Anemia in other chronic diseases classified elsewhere: Secondary | ICD-10-CM | POA: Diagnosis not present

## 2019-03-20 DIAGNOSIS — I2510 Atherosclerotic heart disease of native coronary artery without angina pectoris: Secondary | ICD-10-CM | POA: Diagnosis not present

## 2019-03-20 DIAGNOSIS — J45909 Unspecified asthma, uncomplicated: Secondary | ICD-10-CM | POA: Diagnosis not present

## 2019-03-20 DIAGNOSIS — I700 Atherosclerosis of aorta: Secondary | ICD-10-CM | POA: Diagnosis not present

## 2019-03-20 DIAGNOSIS — Z79899 Other long term (current) drug therapy: Secondary | ICD-10-CM | POA: Insufficient documentation

## 2019-03-20 DIAGNOSIS — R6510 Systemic inflammatory response syndrome (SIRS) of non-infectious origin without acute organ dysfunction: Secondary | ICD-10-CM | POA: Insufficient documentation

## 2019-03-20 DIAGNOSIS — T451X5A Adverse effect of antineoplastic and immunosuppressive drugs, initial encounter: Secondary | ICD-10-CM | POA: Insufficient documentation

## 2019-03-20 DIAGNOSIS — R7303 Prediabetes: Secondary | ICD-10-CM | POA: Diagnosis not present

## 2019-03-20 DIAGNOSIS — R911 Solitary pulmonary nodule: Secondary | ICD-10-CM | POA: Diagnosis not present

## 2019-03-20 LAB — CBC WITH DIFFERENTIAL/PLATELET
Abs Immature Granulocytes: 0.02 10*3/uL (ref 0.00–0.07)
Basophils Absolute: 0 10*3/uL (ref 0.0–0.1)
Basophils Relative: 0 %
Eosinophils Absolute: 0.2 10*3/uL (ref 0.0–0.5)
Eosinophils Relative: 3 %
HCT: 42 % (ref 39.0–52.0)
Hemoglobin: 13.8 g/dL (ref 13.0–17.0)
Immature Granulocytes: 0 %
Lymphocytes Relative: 19 %
Lymphs Abs: 1.3 10*3/uL (ref 0.7–4.0)
MCH: 31 pg (ref 26.0–34.0)
MCHC: 32.9 g/dL (ref 30.0–36.0)
MCV: 94.4 fL (ref 80.0–100.0)
Monocytes Absolute: 0.6 10*3/uL (ref 0.1–1.0)
Monocytes Relative: 9 %
Neutro Abs: 4.5 10*3/uL (ref 1.7–7.7)
Neutrophils Relative %: 69 %
Platelets: 173 10*3/uL (ref 150–400)
RBC: 4.45 MIL/uL (ref 4.22–5.81)
RDW: 12.8 % (ref 11.5–15.5)
WBC: 6.7 10*3/uL (ref 4.0–10.5)
nRBC: 0 % (ref 0.0–0.2)

## 2019-03-20 LAB — COMPREHENSIVE METABOLIC PANEL
ALT: 18 U/L (ref 0–44)
AST: 18 U/L (ref 15–41)
Albumin: 3.4 g/dL — ABNORMAL LOW (ref 3.5–5.0)
Alkaline Phosphatase: 73 U/L (ref 38–126)
Anion gap: 7 (ref 5–15)
BUN: 13 mg/dL (ref 6–20)
CO2: 26 mmol/L (ref 22–32)
Calcium: 8.9 mg/dL (ref 8.9–10.3)
Chloride: 106 mmol/L (ref 98–111)
Creatinine, Ser: 0.77 mg/dL (ref 0.61–1.24)
GFR calc Af Amer: >60 mL/min (ref >60)
GFR calc non Af Amer: >60 mL/min (ref >60)
Glucose, Bld: 108 mg/dL — ABNORMAL HIGH (ref 70–99)
Potassium: 4.8 mmol/L (ref 3.5–5.1)
Sodium: 139 mmol/L (ref 135–145)
Total Bilirubin: 0.3 mg/dL (ref 0.3–1.2)
Total Protein: 7.2 g/dL (ref 6.5–8.1)

## 2019-03-20 LAB — CEA (IN HOUSE-CHCC): CEA (CHCC-In House): 1.49 ng/mL (ref 0.00–5.00)

## 2019-03-20 MED ORDER — IOHEXOL 300 MG/ML  SOLN
100.0000 mL | Freq: Once | INTRAMUSCULAR | Status: AC | PRN
  Administered 2019-03-20: 100 mL via INTRAVENOUS

## 2019-03-20 MED ORDER — HEPARIN SOD (PORK) LOCK FLUSH 100 UNIT/ML IV SOLN
INTRAVENOUS | Status: AC
  Administered 2019-03-20: 500 [IU] via INTRAVENOUS
  Filled 2019-03-20: qty 5

## 2019-03-20 MED ORDER — SODIUM CHLORIDE (PF) 0.9 % IJ SOLN
INTRAMUSCULAR | Status: AC
  Filled 2019-03-20: qty 50

## 2019-03-21 ENCOUNTER — Telehealth: Payer: Self-pay

## 2019-03-21 NOTE — Telephone Encounter (Signed)
Spoke with patient regarding his lab and CT scan results.  Per Dr. Burr Medico no concerns for cancer recurrence.  He verbalized an understanding and was very appreciative of the call.

## 2019-03-21 NOTE — Telephone Encounter (Signed)
-----   Message from [name], MD sent at 03/21/2019  8:16 AM EDT ----- Please let pt know his lab and CT scan were good, no concerns for cancer recurrence, he is supposed to see his surgeon Dr. [name] soon. Thanks   [name]  03/21/2019

## 2019-03-27 ENCOUNTER — Ambulatory Visit: Payer: Medicaid Other | Admitting: Cardiothoracic Surgery

## 2019-03-27 ENCOUNTER — Encounter: Payer: Self-pay | Admitting: Cardiothoracic Surgery

## 2019-03-27 ENCOUNTER — Other Ambulatory Visit: Payer: Self-pay

## 2019-03-27 VITALS — BP 113/76 | HR 79 | Temp 97.7°F | Resp 16 | Ht 60.0 in | Wt 131.4 lb

## 2019-03-27 DIAGNOSIS — Z09 Encounter for follow-up examination after completed treatment for conditions other than malignant neoplasm: Secondary | ICD-10-CM

## 2019-03-27 DIAGNOSIS — C159 Malignant neoplasm of esophagus, unspecified: Secondary | ICD-10-CM | POA: Diagnosis not present

## 2019-03-27 NOTE — Progress Notes (Signed)
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 PROCEDURES PERFORMED:  Bronchoscopy, transhiatal total esophagectomy with cervical esophagogastrostomy, pyloromyotomy, and feeding jejunostomy tube. SURGEON:  [name], MD.  Cancer Staging Malignant neoplasm of lower third of esophagus [facility]) Staging form: Esophagus - Adenocarcinoma, AJCC 8th Edition - Clinical stage from 10/26/2016: Stage III (cT3, cN1, cM0) - Signed by [name], MD on 11/05/2016 - Pathologic stage from 04/20/2017: Stage I (ypT0, pN0, cM0, GX) - Signed by [name], MD on 04/23/2017   History of Present Illness:      Patient returns to the office today for postop check after  Esophagectomy for a stage III adenocarcinoma of the GE junction , he received radiation chemotherapy preoperatively and surgical resection September 2018, pathologically staged as stage I.  The patient did require dilatation of his cervical esophagus early postoperatively.  More recently he is been able to take a p.o. diet without much difficulty, and is maintaining his weight.  His main complaint at this point is bile reflux mostly at night or early morning sometimes waking him up.  He has been watching his diet, elevating the head of his bed, not eating near bedtime.  He notes at times he feels like his stomach is ""raw".       Zubrod Score: At the time of surgery this patient's most appropriate activity status/level should be described as: [x]     0    Normal  activity, no symptoms []     1    Restricted in physical strenuous activity but ambulatory, able to do out light work []     2    Ambulatory and capable of self care, unable to do work activities, up and about                 >50 % of waking hours                                                                                   []     3    Only limited self care, in bed greater than 50% of waking hours []     4    Completely disabled, no self care, confined to bed or chair []     5    Moribund  Social History   Tobacco Use  Smoking Status Former Smoker  . Packs/day: 1.00  . Years: 38.00  . Pack years: 38.00  . Types: Cigarettes  . Start date: 10/31/2012  . Quit date: 10/31/2012  . Years since quitting: 6.4  Smokeless Tobacco Never Used       Allergies  Allergen  Reactions  . Penicillins Hives, Itching and Swelling    Has patient had a PCN reaction causing immediate rash, facial/tongue/throat swelling, SOB or lightheadedness with hypotension: No Has patient had a PCN reaction causing severe rash involving mucus membranes or skin necrosis: No Has patient had a PCN reaction that required hospitalization No Has patient had a PCN reaction occurring within the last 10 years: No If all of the above answers are "NO", then may proceed with Cephalosporin use. Unknown Childhood reaction    Current Outpatient Medications  Medication Sig Dispense Refill  . acetaminophen (TYLENOL) 160 MG/5ML solution Take 10.2 mLs (325 mg total) by mouth every 4 (four) hours as needed for moderate pain. 120 mL 0  . albuterol (ACCUNEB) 0.63 MG/3ML nebulizer solution Take 1 ampule by nebulization every 6 (six) hours as needed for wheezing.    . cyclobenzaprine (FLEXERIL) 5 MG tablet Take 1 tablet (5 mg total) by mouth 3 (three) times daily as needed for muscle spasms. 30 tablet 0  . cyclobenzaprine (FLEXERIL) 5 MG tablet TAKE 1 TABLET BY MOUTH THREE TIMES A DAY AS NEEDED FOR MUSCLE SPASMS 30 tablet 0  . Electrolyte-S  (ISOLYTE-S IV) Inject into the vein. Drinks orally use to use through feeding tube.    . fluticasone (FLONASE) 50 MCG/ACT nasal spray Place 1 spray into both nostrils daily as needed for allergies or rhinitis.    . fluticasone (FLOVENT DISKUS) 50 MCG/BLIST diskus inhaler Inhale into the lungs.    . lidocaine-prilocaine (EMLA) cream APPLY TO PORT-A-CATH SITE 1.5 TO 2 HOURS PRIOR TO PROCEDURE AS NEEDED 30 g 1  . nystatin (MYCOSTATIN/NYSTOP) powder Apply topically 3 (three) times daily. 1 apply to right axilla 30 g 0  . pantoprazole (PROTONIX) 40 MG tablet TAKE 1 TABLET BY MOUTH TWICE A DAY 60 tablet 11   No current facility-administered medications for this visit.     Physical Exam: BP 113/76 (BP Location: Right Arm, Patient Position: Sitting, Cuff Size: Normal)   Pulse 79   Temp 97.7 F (36.5 C)   Resp 16   Ht 5' (1.524 m)   Wt 131 lb 6.4 oz (59.6 kg)   SpO2 98% Comment: RA  BMI 25.66 kg/m   General appearance: alert, cooperative and no distress Head: Normocephalic, without obvious abnormality, atraumatic Neck: no adenopathy, no carotid bruit, no JVD, supple, symmetrical, trachea midline and thyroid not enlarged, symmetric, no tenderness/mass/nodules Lymph nodes: Cervical, supraclavicular, and axillary nodes normal. Resp: clear to auscultation bilaterally Cardio: regular rate and rhythm, S1, S2 normal, no murmur, click, rub or gallop GI: soft, non-tender; bowel sounds normal; no masses,  no organomegaly Extremities: extremities normal, atraumatic, no cyanosis or edema Neurologic: Grossly normal Abdominal incision neck incision is well-healed  Diagnostic Studies & Laboratory data:         Recent Radiology Findings:    On: 03/20/2019 13:45   Ct chest Abdomen Pelvis W Contrast  Result Date: 03/20/2019 CLINICAL DATA:  Esophageal cancer. EXAM: CT CHEST, ABDOMEN, AND PELVIS WITH CONTRAST TECHNIQUE: Multidetector CT imaging of the chest, abdomen and pelvis was performed following the  standard protocol during bolus administration of intravenous contrast. CONTRAST:  131mL OMNIPAQUE IOHEXOL 300 MG/ML  SOLN COMPARISON:  04/10/2018 FINDINGS: CT CHEST FINDINGS Cardiovascular: The heart size is normal. Similar appearance trace pericardial effusion. Coronary artery calcification is evident. Right Port-A-Cath tip is positioned in the mid SVC. Atherosclerotic calcification is noted in the wall of the thoracic aorta. Mediastinum/Nodes: Mediastinum status post gastric pull-through procedure. No mediastinal  lymphadenopathy. There is no hilar lymphadenopathy. There is no axillary lymphadenopathy. Lungs/Pleura: 2 mm peripheral right upper lobe pulmonary nodule is stable in the interval, consistent with benign etiology. No new suspicious pulmonary nodule or mass. No pleural effusion. No focal airspace consolidation. Musculoskeletal: No worrisome lytic or sclerotic osseous abnormality. CT ABDOMEN PELVIS FINDINGS Hepatobiliary: No suspicious focal abnormality within the liver parenchyma. There is no evidence for gallstones, gallbladder wall thickening, or pericholecystic fluid. No intrahepatic or extrahepatic biliary dilation. Pancreas: No focal mass lesion. No dilatation of the main duct. No intraparenchymal cyst. No peripancreatic edema. Spleen: No splenomegaly. No focal mass lesion. Adrenals/Urinary Tract: No adrenal nodule or mass. Kidneys unremarkable. No evidence for hydroureter. The urinary bladder appears normal for the degree of distention. Stomach/Bowel: Status post gastric pull-through procedure. Duodenum is normally positioned as is the ligament of Treitz. No small bowel wall thickening. No small bowel dilatation. The terminal ileum is normal. The appendix is normal. No gross colonic mass. No colonic wall thickening. Vascular/Lymphatic: There is abdominal aortic atherosclerosis without aneurysm. There is no gastrohepatic or hepatoduodenal ligament lymphadenopathy. No intraperitoneal or retroperitoneal  lymphadenopathy. No pelvic sidewall lymphadenopathy. Reproductive: The prostate gland and seminal vesicles are unremarkable. Other: No intraperitoneal free fluid. Musculoskeletal: No worrisome lytic or sclerotic osseous abnormality. l IMPRESSION: 1. Stable exam. No new or progressive interval findings to suggest recurrent/metastatic disease. 2.  Aortic Atherosclerois (ICD10-170.0) Electronically Signed   By: Misty Stanley M.D.   On: 03/20/2019 13:45   I have independently reviewed the above radiology studies  and reviewed the findings with the patient.   CLINICAL DATA:  Esophageal cancer, chemotherapy and radiation therapy complete.  EXAM: CT CHEST and abdomen WITH CONTRAST  TECHNIQUE: Multidetector CT imaging of the chest and abdomen was performed following the standard protocol during bolus administration of intravenous contrast.  CONTRAST:  132mL OMNIPAQUE IOHEXOL 300 MG/ML  SOLN  COMPARISON:  CT chest abdomen 09/11/2017.  FINDINGS: CT CHEST FINDINGS  Cardiovascular: Right IJ Port-A-Cath terminates in the right atrium. Atherosclerotic calcification of the arterial vasculature, including coronary arteries. Heart size normal. Small pericardial effusion is stable.  Mediastinum/Nodes: Mediastinal lymph nodes are not enlarged by CT size criteria and are stable. No hilar or axillary adenopathy. Esophagectomy and gastric pull-through.  Lungs/Pleura: Mild paraseptal emphysema. Irregular consolidation in the medial aspect of the right lower lobe (series 7, image 105), unchanged. Lungs are otherwise clear. No pleural fluid. Airway is unremarkable.  Musculoskeletal: No worrisome lytic or sclerotic lesions.  CT ABDOMEN FINDINGS  Hepatobiliary: Liver and gallbladder are unremarkable. No biliary ductal dilatation.  Pancreas: Negative.  Spleen: Negative.  Adrenals/Urinary Tract: Adrenal glands and kidneys are unremarkable.  Stomach/Bowel: Gastric pull-through.  Visualized portions of the small bowel, appendix and colon are unremarkable.  Vascular/Lymphatic: Atherosclerotic calcification of the arterial vasculature without abdominal aortic aneurysm. Lymph node at the diaphragmatic hiatus measures 8 mm (series 2, image 51), unchanged.  Other: No free fluid.  Mesenteries and peritoneum are unremarkable.  Musculoskeletal: No worrisome lytic or sclerotic lesions.  IMPRESSION: 1. No evidence of metastatic disease. 2. Aortic atherosclerosis (ICD10-170.0). Coronary artery calcification. 3.  Emphysema (ICD10-J43.9).   Electronically Signed   By: Lorin Picket M.D.   On: 04/10/2018 15:07    Recent Labs: Lab Results  Component Value Date   WBC 6.7 03/20/2019   HGB 13.8 03/20/2019   HCT 42.0 03/20/2019   PLT 173 03/20/2019   GLUCOSE 108 (H) 03/20/2019   CHOL 157 10/27/2014   TRIG 83 10/27/2014  HDL 34 (L) 10/27/2014   LDLCALC 106 (H) 10/27/2014   ALT 18 03/20/2019   AST 18 03/20/2019   NA 139 03/20/2019   K 4.8 03/20/2019   CL 106 03/20/2019   CREATININE 0.77 03/20/2019   BUN 13 03/20/2019   CO2 26 03/20/2019   INR 0.91 04/12/2017   HGBA1C 5.7 (H) 10/27/2014   Wt Readings from Last 3 Encounters:  03/27/19 131 lb 6.4 oz (59.6 kg)  12/11/18 136 lb 12.8 oz (62.1 kg)  09/26/18 131 lb (59.4 kg)      Assessment / Plan:   Patient stable after transhiatal total esophagectomyCancer Staging Malignant neoplasm of lower third of esophagus ([facility]) Staging form: Esophagus - Adenocarcinoma, AJCC 8th Edition - Clinical stage from 10/26/2016: Stage III (cT3, cN1, cM0) - Signed by [name], MD on 11/05/2016 - Pathologic stage from 04/20/2017: Stage I (ypT0, pN0, cM0, GX) - Signed by [name], MD on 04/23/2017  Patient now 2 years after esophagectomy, with probable symptoms of bile reflux.  I have suggested to him trying Carafate late in the day before bedtime, if that does not help we could consider use of cholestyramine as a  bile salt binder.  Patient will contact Dr. [name] to consider repeat endoscopy evaluate both his gastric remnant and if he needs repeat dilatation.  Plan to see him back in 6 months     [name] 03/27/2019 9:34 AM

## 2019-03-29 ENCOUNTER — Observation Stay (HOSPITAL_COMMUNITY)
Admission: EM | Admit: 2019-03-29 | Discharge: 2019-03-30 | Disposition: A | Discharge status: Home / Self Care | Payer: Medicaid Other | Attending: Internal Medicine | Admitting: Internal Medicine

## 2019-03-29 ENCOUNTER — Other Ambulatory Visit: Payer: Self-pay

## 2019-03-29 ENCOUNTER — Encounter (HOSPITAL_COMMUNITY): Payer: Self-pay | Admitting: Emergency Medicine

## 2019-03-29 DIAGNOSIS — J439 Emphysema, unspecified: Secondary | ICD-10-CM | POA: Diagnosis not present

## 2019-03-29 DIAGNOSIS — W44F3XA Food entering into or through a natural orifice, initial encounter: Secondary | ICD-10-CM

## 2019-03-29 DIAGNOSIS — X58XXXA Exposure to other specified factors, initial encounter: Secondary | ICD-10-CM | POA: Diagnosis not present

## 2019-03-29 DIAGNOSIS — K9589 Other complications of other bariatric procedure: Secondary | ICD-10-CM | POA: Insufficient documentation

## 2019-03-29 DIAGNOSIS — I10 Essential (primary) hypertension: Secondary | ICD-10-CM | POA: Insufficient documentation

## 2019-03-29 DIAGNOSIS — R1314 Dysphagia, pharyngoesophageal phase: Secondary | ICD-10-CM | POA: Diagnosis not present

## 2019-03-29 DIAGNOSIS — Z20828 Contact with and (suspected) exposure to other viral communicable diseases: Secondary | ICD-10-CM | POA: Diagnosis not present

## 2019-03-29 DIAGNOSIS — Z880 Allergy status to penicillin: Secondary | ICD-10-CM | POA: Insufficient documentation

## 2019-03-29 DIAGNOSIS — K3189 Other diseases of stomach and duodenum: Secondary | ICD-10-CM | POA: Insufficient documentation

## 2019-03-29 DIAGNOSIS — G802 Spastic hemiplegic cerebral palsy: Secondary | ICD-10-CM | POA: Insufficient documentation

## 2019-03-29 DIAGNOSIS — Z9049 Acquired absence of other specified parts of digestive tract: Secondary | ICD-10-CM | POA: Diagnosis not present

## 2019-03-29 DIAGNOSIS — Z79899 Other long term (current) drug therapy: Secondary | ICD-10-CM | POA: Diagnosis not present

## 2019-03-29 DIAGNOSIS — K219 Gastro-esophageal reflux disease without esophagitis: Secondary | ICD-10-CM | POA: Diagnosis not present

## 2019-03-29 DIAGNOSIS — Z7951 Long term (current) use of inhaled steroids: Secondary | ICD-10-CM | POA: Diagnosis not present

## 2019-03-29 DIAGNOSIS — M1990 Unspecified osteoarthritis, unspecified site: Secondary | ICD-10-CM | POA: Insufficient documentation

## 2019-03-29 DIAGNOSIS — G709 Myoneural disorder, unspecified: Secondary | ICD-10-CM | POA: Insufficient documentation

## 2019-03-29 DIAGNOSIS — T18128A Food in esophagus causing other injury, initial encounter: Principal | ICD-10-CM

## 2019-03-29 DIAGNOSIS — Z87891 Personal history of nicotine dependence: Secondary | ICD-10-CM | POA: Diagnosis not present

## 2019-03-29 DIAGNOSIS — Z8501 Personal history of malignant neoplasm of esophagus: Secondary | ICD-10-CM | POA: Diagnosis not present

## 2019-03-29 DIAGNOSIS — Z03818 Encounter for observation for suspected exposure to other biological agents ruled out: Secondary | ICD-10-CM | POA: Diagnosis not present

## 2019-03-29 DIAGNOSIS — K222 Esophageal obstruction: Secondary | ICD-10-CM | POA: Insufficient documentation

## 2019-03-29 DIAGNOSIS — J45909 Unspecified asthma, uncomplicated: Secondary | ICD-10-CM | POA: Diagnosis present

## 2019-03-29 NOTE — ED Triage Notes (Signed)
Patient with food bolus.  He states that he was eating pork chop and has a piece stuck in his throat.  He has been able to swallow his sections but unable to keep water down.

## 2019-03-30 ENCOUNTER — Encounter (HOSPITAL_COMMUNITY): Payer: Self-pay | Admitting: Internal Medicine

## 2019-03-30 ENCOUNTER — Observation Stay (HOSPITAL_COMMUNITY): Payer: Medicaid Other | Admitting: Certified Registered Nurse Anesthetist

## 2019-03-30 ENCOUNTER — Encounter (HOSPITAL_COMMUNITY)
Admission: EM | Disposition: A | Discharge status: Home / Self Care | Payer: Self-pay | Source: Home / Self Care | Attending: Emergency Medicine

## 2019-03-30 DIAGNOSIS — K222 Esophageal obstruction: Secondary | ICD-10-CM

## 2019-03-30 DIAGNOSIS — K219 Gastro-esophageal reflux disease without esophagitis: Secondary | ICD-10-CM | POA: Diagnosis not present

## 2019-03-30 DIAGNOSIS — J45901 Unspecified asthma with (acute) exacerbation: Secondary | ICD-10-CM

## 2019-03-30 DIAGNOSIS — T18128A Food in esophagus causing other injury, initial encounter: Secondary | ICD-10-CM | POA: Diagnosis present

## 2019-03-30 DIAGNOSIS — Z20828 Contact with and (suspected) exposure to other viral communicable diseases: Secondary | ICD-10-CM | POA: Diagnosis not present

## 2019-03-30 DIAGNOSIS — R1314 Dysphagia, pharyngoesophageal phase: Secondary | ICD-10-CM | POA: Diagnosis not present

## 2019-03-30 DIAGNOSIS — G709 Myoneural disorder, unspecified: Secondary | ICD-10-CM | POA: Diagnosis not present

## 2019-03-30 DIAGNOSIS — K3189 Other diseases of stomach and duodenum: Secondary | ICD-10-CM | POA: Diagnosis not present

## 2019-03-30 DIAGNOSIS — W44F3XA Food entering into or through a natural orifice, initial encounter: Secondary | ICD-10-CM | POA: Diagnosis present

## 2019-03-30 DIAGNOSIS — K9589 Other complications of other bariatric procedure: Secondary | ICD-10-CM | POA: Diagnosis not present

## 2019-03-30 DIAGNOSIS — G802 Spastic hemiplegic cerebral palsy: Secondary | ICD-10-CM | POA: Diagnosis not present

## 2019-03-30 DIAGNOSIS — Z9049 Acquired absence of other specified parts of digestive tract: Secondary | ICD-10-CM | POA: Diagnosis not present

## 2019-03-30 DIAGNOSIS — Z8501 Personal history of malignant neoplasm of esophagus: Secondary | ICD-10-CM | POA: Diagnosis not present

## 2019-03-30 DIAGNOSIS — Q402 Other specified congenital malformations of stomach: Secondary | ICD-10-CM | POA: Diagnosis not present

## 2019-03-30 DIAGNOSIS — J439 Emphysema, unspecified: Secondary | ICD-10-CM | POA: Diagnosis not present

## 2019-03-30 HISTORY — PX: FOREIGN BODY REMOVAL: SHX962

## 2019-03-30 HISTORY — PX: ESOPHAGOGASTRODUODENOSCOPY (EGD) WITH PROPOFOL: SHX5813

## 2019-03-30 LAB — COMPREHENSIVE METABOLIC PANEL
ALT: 17 U/L (ref 0–44)
AST: 18 U/L (ref 15–41)
Albumin: 3.7 g/dL (ref 3.5–5.0)
Alkaline Phosphatase: 74 U/L (ref 38–126)
Anion gap: 11 (ref 5–15)
BUN: 17 mg/dL (ref 6–20)
CO2: 22 mmol/L (ref 22–32)
Calcium: 9 mg/dL (ref 8.9–10.3)
Chloride: 104 mmol/L (ref 98–111)
Creatinine, Ser: 0.72 mg/dL (ref 0.61–1.24)
GFR calc Af Amer: >60 mL/min (ref >60)
GFR calc non Af Amer: >60 mL/min (ref >60)
Glucose, Bld: 124 mg/dL — ABNORMAL HIGH (ref 70–99)
Potassium: 3.3 mmol/L — ABNORMAL LOW (ref 3.5–5.1)
Sodium: 137 mmol/L (ref 135–145)
Total Bilirubin: 0.5 mg/dL (ref 0.3–1.2)
Total Protein: 7.1 g/dL (ref 6.5–8.1)

## 2019-03-30 LAB — CBC
HCT: 43.1 % (ref 39.0–52.0)
Hemoglobin: 14.5 g/dL (ref 13.0–17.0)
MCH: 30.7 pg (ref 26.0–34.0)
MCHC: 33.6 g/dL (ref 30.0–36.0)
MCV: 91.3 fL (ref 80.0–100.0)
Platelets: 199 10*3/uL (ref 150–400)
RBC: 4.72 MIL/uL (ref 4.22–5.81)
RDW: 12.5 % (ref 11.5–15.5)
WBC: 7 10*3/uL (ref 4.0–10.5)
nRBC: 0 % (ref 0.0–0.2)

## 2019-03-30 LAB — PROTIME-INR
INR: 1 (ref 0.8–1.2)
Prothrombin Time: 13.2 seconds (ref 11.4–15.2)

## 2019-03-30 LAB — SARS CORONAVIRUS 2 BY RT PCR (HOSPITAL ORDER, PERFORMED IN ~~LOC~~ HOSPITAL LAB): SARS Coronavirus 2: NEGATIVE

## 2019-03-30 LAB — HIV ANTIBODY (ROUTINE TESTING W REFLEX): HIV Screen 4th Generation wRfx: NONREACTIVE

## 2019-03-30 SURGERY — ESOPHAGOGASTRODUODENOSCOPY (EGD) WITH PROPOFOL
Anesthesia: General

## 2019-03-30 MED ORDER — SUCCINYLCHOLINE CHLORIDE 20 MG/ML IJ SOLN
INTRAMUSCULAR | Status: DC | PRN
  Administered 2019-03-30: 100 mg via INTRAVENOUS

## 2019-03-30 MED ORDER — HEPARIN SOD (PORK) LOCK FLUSH 100 UNIT/ML IV SOLN
500.0000 [IU] | INTRAVENOUS | Status: AC | PRN
  Administered 2019-03-30: 500 [IU]

## 2019-03-30 MED ORDER — FLUTICASONE PROPIONATE 50 MCG/ACT NA SUSP
1.0000 | Freq: Every day | NASAL | Status: DC | PRN
  Filled 2019-03-30: qty 16

## 2019-03-30 MED ORDER — ONDANSETRON HCL 4 MG/2ML IJ SOLN
INTRAMUSCULAR | Status: DC | PRN
  Administered 2019-03-30: 4 mg via INTRAVENOUS

## 2019-03-30 MED ORDER — SODIUM CHLORIDE 0.9 % IV SOLN
INTRAVENOUS | Status: DC
  Administered 2019-03-30: 06:00:00 via INTRAVENOUS

## 2019-03-30 MED ORDER — CYCLOBENZAPRINE HCL 5 MG PO TABS: 5.0000 mg | ORAL_TABLET | Freq: Three times a day (TID) | ORAL | Status: DC | PRN

## 2019-03-30 MED ORDER — PANTOPRAZOLE SODIUM 40 MG PO TBEC
40.0000 mg | DELAYED_RELEASE_TABLET | Freq: Two times a day (BID) | ORAL | Status: DC
  Administered 2019-03-30: 40 mg via ORAL
  Filled 2019-03-30: qty 1

## 2019-03-30 MED ORDER — FENTANYL CITRATE (PF) 100 MCG/2ML IJ SOLN
INTRAMUSCULAR | Status: AC
  Filled 2019-03-30: qty 2

## 2019-03-30 MED ORDER — BUDESONIDE 0.25 MG/2ML IN SUSP
2.0000 mL | Freq: Two times a day (BID) | RESPIRATORY_TRACT | Status: DC
  Administered 2019-03-30: 0.25 mg via RESPIRATORY_TRACT
  Filled 2019-03-30 (×2): qty 2

## 2019-03-30 MED ORDER — ALBUTEROL SULFATE (2.5 MG/3ML) 0.083% IN NEBU: 3.0000 mL | INHALATION_SOLUTION | Freq: Four times a day (QID) | RESPIRATORY_TRACT | Status: DC | PRN

## 2019-03-30 MED ORDER — PANTOPRAZOLE SODIUM 40 MG PO TBEC: 40.0000 mg | DELAYED_RELEASE_TABLET | Freq: Two times a day (BID) | ORAL | Status: DC

## 2019-03-30 MED ORDER — PROPOFOL 10 MG/ML IV BOLUS
INTRAVENOUS | Status: DC | PRN
  Administered 2019-03-30: 50 mg via INTRAVENOUS

## 2019-03-30 MED ORDER — PROPOFOL 500 MG/50ML IV EMUL
INTRAVENOUS | Status: DC | PRN
  Administered 2019-03-30: 100 ug/kg/min via INTRAVENOUS

## 2019-03-30 SURGICAL SUPPLY — 15 items

## 2019-03-30 NOTE — H&P (Signed)
TRH H&P    Patient Demographics:    Darrell Cunningham, is a 58 y.o. male  MRN: [id number]  DOB - 07-22-1961  Admit Date - 03/29/2019  Referring MD/NP/PA:  Malachy Moan  Outpatient Primary MD for the patient is Pickard, [name], MD [place] - oncology [name] - surgery  Patient coming from:  home  Chief complaint-  Food impaction   HPI:    Darrell Cunningham  is a 58 y.o. 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 Pt also notes has been having more Gerd than normal.   In ED,   Covid-19 negative  GI has been consulted and requested obs admission while covid was pending.  EGD planned for this am  Pt will be admitted obs for food impaction.      Review of systems:    In addition to the HPI above,  No Fever-chills, No Headache, No changes with Vision or hearing,   No Chest pain, Cough or Shortness of Breath, No Abdominal pain, No Nausea or Vomiting, bowel movements are regular, No Blood in stool or Urine, No dysuria, No new skin rashes or bruises, No new joints pains-aches,  No new weakness, tingling, numbness in any extremity, No recent weight gain or loss, No polyuria, polydypsia or polyphagia, No significant Mental Stressors.  All other systems reviewed and are negative.    Past History of the following :    Past Medical History:  Diagnosis Date  . Allergy   . Arthritis   . Asthma    as a child  . Cancer ([place]) 10/09/2016   ESOPHAGUS CARCINOMA   . COPD (chronic obstructive pulmonary disease) ([place])   . CP (cerebral palsy), spastic ([place])    right  . Dysphagia   . Emphysema of lung ([place])   . Encounter for nonprocreative  genetic counseling 10/31/2016   Darrell Cunningham underwent genetic counseling for hereditary cancer syndromes on 10/31/2016. Though he is a candidate for genetic testing, he declines at this time.  Marland Kitchen GERD (gastroesophageal reflux disease)   . Hypertension   . Neuromuscular disorder ([place])   . Pneumonia 4 yrs ago      Past Surgical History:  Procedure Laterality Date  . COLONOSCOPY    . COMPLETE ESOPHAGECTOMY N/A 04/16/2017   Procedure: ESOPHAGECTOMY COMPLETE,Transhiatal total esophagectomy;  Surgeon: [name], MD;  Location: [facility] OR;  Service: Thoracic;  Laterality: N/A;  . EUS N/A 10/26/2016   Procedure: UPPER ENDOSCOPIC ULTRASOUND (EUS) LINEAR;  Surgeon: [name], MD;  Location: [facility] ENDOSCOPY;  Service: Endoscopy;  Laterality: N/A;  . EYE SURGERY Bilateral age 32    for cross eyes  . IR FLUORO GUIDE PORT INSERTION RIGHT  11/08/2016   right upper chest  . IR US GUIDE VASC ACCESS RIGHT  11/08/2016  . JEJUNOSTOMY N/A 04/16/2017  Procedure: Donney Rankins;  Surgeon: [name], MD;  Location: [facility] OR;  Service: Thoracic;  Laterality: N/A;  . JEJUNOSTOMY     removal of feeding tube /pt unsure of date  . MOUTH SURGERY    . UPPER GASTROINTESTINAL ENDOSCOPY    . VIDEO BRONCHOSCOPY N/A 04/16/2017   Procedure: VIDEO BRONCHOSCOPY, Transhiatal Total Esophagectomy, Esophagogastrostomy, pyloromotomy, Feeding Jejunostomy;  Surgeon: [name], MD;  Location: [place] OR;  Service: Thoracic;  Laterality: N/A;      Social History:      Social History   Tobacco Use  . Smoking status: Former Smoker    Packs/day: 1.00    Years: 38.00    Pack years: 38.00    Types: Cigarettes    Start date: 10/31/2012    Quit date: 10/31/2012    Years since quitting: 6.4  . Smokeless tobacco: Never Used  Substance Use Topics  . Alcohol use: No    Alcohol/week: 0.0 standard drinks       Family History :     Family History  Problem Relation Age of Onset  . Colon cancer Maternal  Grandmother 12  . Breast cancer Maternal Grandmother 64  . Breast cancer Sister 85       Deceased at 43 of breast cancer  . Ovarian cancer Maternal Aunt   . COPD Father   . Stomach cancer Neg Hx   . Esophageal cancer Neg Hx   . Colon polyps Neg Hx   . Rectal cancer Neg Hx        Home Medications:   Prior to Admission medications   Medication Sig Start Date End Date Taking? Authorizing Provider  acetaminophen (TYLENOL) 160 MG/5ML solution Take 10.2 mLs (325 mg total) by mouth every 4 (four) hours as needed for moderate pain. 04/29/17   Barrett, Erin R, PA-C  albuterol (ACCUNEB) 0.63 MG/3ML nebulizer solution Take 1 ampule by nebulization every 6 (six) hours as needed for wheezing.    [provider]  cyclobenzaprine (FLEXERIL) 5 MG tablet Take 1 tablet (5 mg total) by mouth 3 (three) times daily as needed for muscle spasms. 01/07/19   [name], MD  cyclobenzaprine (FLEXERIL) 5 MG tablet TAKE 1 TABLET BY MOUTH THREE TIMES A DAY AS NEEDED FOR MUSCLE SPASMS 01/07/19   [name], MD  Electrolyte-S (ISOLYTE-S IV) Inject into the vein. Drinks orally use to use through feeding tube.    [provider]  fluticasone (FLONASE) 50 MCG/ACT nasal spray Place 1 spray into both nostrils daily as needed for allergies or rhinitis.    [provider]  fluticasone (FLOVENT DISKUS) 50 MCG/BLIST diskus inhaler Inhale into the lungs.    [provider]  lidocaine-prilocaine (EMLA) cream APPLY TO PORT-A-CATH SITE 1.5 TO 2 HOURS PRIOR TO PROCEDURE AS NEEDED 02/22/18   [name], MD  nystatin (MYCOSTATIN/NYSTOP) powder Apply topically 3 (three) times daily. 1 apply to right axilla 12/11/18   [name], MD  pantoprazole (PROTONIX) 40 MG tablet TAKE 1 TABLET BY MOUTH TWICE A DAY 02/11/18   [name], MD     Allergies:     Allergies  Allergen Reactions  . Penicillins Hives, Itching and Swelling    Has patient had a PCN reaction causing immediate rash,  facial/tongue/throat swelling, SOB or lightheadedness with hypotension: No Has patient had a PCN reaction causing severe rash involving mucus membranes or skin necrosis: No Has patient had a PCN reaction that required hospitalization No Has patient had a  PCN reaction occurring within the last 10 years: No If all of the above answers are "NO", then may proceed with Cephalosporin use. Unknown Childhood reaction     Physical Exam:   Vitals  Blood pressure (!) 115/99, pulse (!) 102, temperature 98.3 F (36.8 C), temperature source Oral, resp. rate 16, SpO2 95 %.  1.  General: axoxo3  2. Psychiatric: euthymic  3. Neurologic: nonfocal  4. HEENMT:  Anicteric, pupils 1.78mm symmetric, direct, consensual, near intact Neck: no jvd  5. Respiratory : CTAB  6. Cardiovascular : rrr s1, s2,   7. Gastrointestinal:  Abd: soft, nt, nd, +bs  8. Skin:  Ext: no c/c/e, no rash  9.Musculoskeletal:  Good ROM    Data Review:    CBC No results for input(s): WBC, HGB, HCT, PLT, MCV, MCH, MCHC, RDW, LYMPHSABS, MONOABS, EOSABS, BASOSABS, BANDABS in the last 168 hours.  Invalid input(s): NEUTRABS, BANDSABD ------------------------------------------------------------------------------------------------------------------  Results for orders placed or performed during the hospital encounter of 03/29/19 (from the past 48 hour(s))  SARS Coronavirus 2 [facility] order, Performed in [facility] hospital lab) Nasopharyngeal Nasopharyngeal Swab     Status: None   Collection Time: 03/30/19 12:59 AM   Specimen: Nasopharyngeal Swab  Result Value Ref Range   SARS Coronavirus 2 NEGATIVE NEGATIVE    Comment: (NOTE) If result is NEGATIVE SARS-CoV-2 target nucleic acids are NOT DETECTED. The SARS-CoV-2 RNA is generally detectable in upper and lower  respiratory specimens during the acute phase of infection. The lowest  concentration of SARS-CoV-2 viral copies this assay can detect is 250  copies / mL.  A negative result does not preclude SARS-CoV-2 infection  and should not be used as the sole basis for treatment or other  patient management decisions.  A negative result may occur with  improper specimen collection / handling, submission of specimen other  than nasopharyngeal swab, presence of viral mutation(s) within the  areas targeted by this assay, and inadequate number of viral copies  (<250 copies / mL). A negative result must be combined with clinical  observations, patient history, and epidemiological information. If result is POSITIVE SARS-CoV-2 target nucleic acids are DETECTED. The SARS-CoV-2 RNA is generally detectable in upper and lower  respiratory specimens dur ing the acute phase of infection.  Positive  results are indicative of active infection with SARS-CoV-2.  Clinical  correlation with patient history and other diagnostic information is  necessary to determine patient infection status.  Positive results do  not rule out bacterial infection or co-infection with other viruses. If result is PRESUMPTIVE POSTIVE SARS-CoV-2 nucleic acids MAY BE PRESENT.   A presumptive positive result was obtained on the submitted specimen  and confirmed on repeat testing.  While 2019 novel coronavirus  (SARS-CoV-2) nucleic acids may be present in the submitted sample  additional confirmatory testing may be necessary for epidemiological  and / or clinical management purposes  to differentiate between  SARS-CoV-2 and other Sarbecovirus currently known to infect humans.  If clinically indicated additional testing with an alternate test  methodology [phone]) is advised. The SARS-CoV-2 RNA is generally  detectable in upper and lower respiratory sp ecimens during the acute  phase of infection. The expected result is Negative. Fact Sheet for Patients:  StrictlyIdeas.no Fact Sheet for Healthcare Providers: BankingDealers.co.za This test is not  yet approved or cleared by the [place] FDA and has been authorized for detection and/or diagnosis of SARS-CoV-2 by FDA under an Emergency Use Authorization (EUA).  This EUA will remain  in effect (meaning this test can be used) for the duration of the COVID-19 declaration under Section 564(b)(1) of the Act, 21 U.S.C. section 360bbb-3(b)(1), unless the authorization is terminated or revoked sooner. Performed at [facility], [place] [address], [place], [place] [address]     Chemistries  No results for input(s): NA, K, CL, CO2, GLUCOSE, BUN, CREATININE, CALCIUM, MG, AST, ALT, ALKPHOS, BILITOT in the last 168 hours.  Invalid input(s): GFRCGP ------------------------------------------------------------------------------------------------------------------  ------------------------------------------------------------------------------------------------------------------ GFR: Estimated Creatinine Clearance: 69.4 mL/min (by C-G formula based on SCr of 0.77 mg/dL). Liver Function Tests: No results for input(s): AST, ALT, ALKPHOS, BILITOT, PROT, ALBUMIN in the last 168 hours. No results for input(s): LIPASE, AMYLASE in the last 168 hours. No results for input(s): AMMONIA in the last 168 hours. Coagulation Profile: No results for input(s): INR, PROTIME in the last 168 hours. Cardiac Enzymes: No results for input(s): CKTOTAL, CKMB, CKMBINDEX, TROPONINI in the last 168 hours. BNP (last 3 results) No results for input(s): PROBNP in the last 8760 hours. HbA1C: No results for input(s): HGBA1C in the last 72 hours. CBG: No results for input(s): GLUCAP in the last 168 hours. Lipid Profile: No results for input(s): CHOL, HDL, LDLCALC, TRIG, CHOLHDL, LDLDIRECT in the last 72 hours. Thyroid Function Tests: No results for input(s): TSH, T4TOTAL, FREET4, T3FREE, THYROIDAB in the last 72 hours. Anemia Panel: No results for input(s): VITAMINB12, FOLATE, FERRITIN, TIBC, IRON, RETICCTPCT in the  last 72 hours.  --------------------------------------------------------------------------------------------------------------- Urine analysis:    Component Value Date/Time   COLORURINE YELLOW 04/12/2017 1455   APPEARANCEUR CLEAR 04/12/2017 1455   LABSPEC >1.046 (H) 04/12/2017 1455   PHURINE 5.0 04/12/2017 1455   GLUCOSEU NEGATIVE 04/12/2017 1455   HGBUR SMALL (A) 04/12/2017 1455   BILIRUBINUR NEGATIVE 04/12/2017 1455   KETONESUR NEGATIVE 04/12/2017 1455   PROTEINUR NEGATIVE 04/12/2017 1455   NITRITE NEGATIVE 04/12/2017 1455   LEUKOCYTESUR NEGATIVE 04/12/2017 1455      Imaging Results:    No results found.     Assessment & Plan:    Principal Problem:   Food impaction of esophagus Active Problems:   Asthma   Food impaction Hx of esophageal cancer s/p transhiatal total esophagectomy with cervical esophagogastrostomy 04/16/2017 NPO Check cbc, cmp, inr EGD this am GI consulted by ED, appreciate input  Asthma Cont Advair  Hayfever Cont Flonase   DVT Prophylaxis-    SCDs   AM Labs Ordered, also please review Full Orders  Family Communication: Admission, patients condition and plan of care including tests being ordered have been discussed with the patient and wife  who indicate understanding and agree with the plan and Code Status.  Code Status:  FULL CODE, wife present with patient  Admission status: Observation: Based on patients clinical presentation and evaluation of above clinical data, I have made determination that patient meets observation criteria at this time.  Time spent in minutes : 55   Jani Gravel M.D on 03/30/2019 at 5:50 AM

## 2019-03-30 NOTE — Progress Notes (Signed)
Pt to Endo lab via w/c. No change in condition.  Pt notified his ride home: [employer] that he would call when he was done with his procedure.  Continues to deny c/o pain or discomfort.

## 2019-03-30 NOTE — Progress Notes (Addendum)
Lab here for blood work.  Blood drawn as per policy.  Endo lab waiting for report on pt. Darrell Cunningham from Endo taking report.

## 2019-03-30 NOTE — ED Notes (Signed)
Gave pt water, says he still feels the food stuck. EDP notified

## 2019-03-30 NOTE — Anesthesia Procedure Notes (Signed)
Procedure Name: MAC Date/Time: 03/30/2019 8:02 AM Performed by: Oletta Lamas, CRNA Pre-anesthesia Checklist: Patient identified, Emergency Drugs available, Suction available, Patient being monitored and Timeout performed Patient Re-evaluated:Patient Re-evaluated prior to induction Oxygen Delivery Method: Nasal cannula

## 2019-03-30 NOTE — Op Note (Addendum)
[facility] Patient Name: Darrell Cunningham Procedure Date : 03/30/2019 MRN: [id number] Attending MD: Ladene Artist , MD Date of Birth: 09-24-1960 CSN: [id number] Age: 58 Admit Type: Inpatient Procedure:                Upper GI endoscopy Indications:              Foreign body in the esophagus - food impaction,                            Esophageal dysphagia Providers:                Pricilla Riffle. Fuller Plan, MD, Angus Seller, Laverda Sorenson, Technician, Gala Lewandowsky, CRNA Referring MD:             [facility] Medicines:                MAC to General Anesthesia Complications:            No immediate complications. Estimated Blood Loss:     Estimated blood loss: none. Procedure:                Pre-Anesthesia Assessment:                           - Prior to the procedure, a History and Physical                            was performed, and patient medications and                            allergies were reviewed. The patient's tolerance of                            previous anesthesia was also reviewed. The risks                            and benefits of the procedure and the sedation                            options and risks were discussed with the patient.                            All questions were answered, and informed consent                            was obtained. Prior Anticoagulants: The patient has                            taken no previous anticoagulant or antiplatelet                            agents. ASA Grade Assessment: III - A patient with  severe systemic disease. After reviewing the risks                            and benefits, the patient was deemed in                            satisfactory condition to undergo the procedure.                           After obtaining informed consent, the endoscope was                            passed under direct vision. Throughout the   procedure, the patient's blood pressure, pulse, and                            oxygen saturations were monitored continuously. The                            GIF-H190 ([id number]) Olympus gastroscope was                            introduced through the mouth, and advanced to the                            second part of duodenum. The upper GI endoscopy was                            somewhat difficult due to post-surgical anatomy.                            The patient tolerated the procedure well. Scope In: Scope Out: Findings:      Food impaction was found in the proximal esophagus. A large piece of       meat. Removal of food was accomplished with tripod grasper. A portion of       the food bolus dislodged in the oropharynx, unable to quickly retreive       and patient was intubated by anesthesia. The remaining food was removed       both orally and by gently advancing into the stomach.      One benign-appearing, intrinsic moderate stenosis was found 20 cm from       the incisors. This stenosis measured 1.2 cm (inner diameter). The       stenosis was traversed.      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      Evidence of an esophagectomy, gastric pull up was found. Sutures noted       in the gastric body.      A medium amount of food (residue) was found in the gastric body that       obscured visualization.      The exam of the stomach was otherwise normal.      The duodenal bulb and second portion of the duodenum were normal. Impression:               -  Food impaction in the proximal esophagus. Removal                            was successful.                           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                           - Ectopic gastric mucosa in the proximal esophagus.                           - Prior esophagectomy, gastric pull up. Sutures                            noted in gastric  body.                           - A medium amount of food (residue) in the stomach                            that obscured visualization.                           - Normal duodenal bulb and second portion of the                            duodenum. Recommendation:           - Patient has a contact number available for                            emergencies. The signs and symptoms of potential                            delayed complications were discussed with the                            patient. Return to normal activities tomorrow.                            Written discharge instructions were provided to the                            patient.                           - Clear liquid diet today, then advance as                            tolerated to soft diet until EGD/dilation. No meat                            or bread until EGD/dilation.                           -  Continue present medications.                           - Schedule EGD/dilation with Dr. [name].                           - Discharge home today. Procedure Code(s):        --- Professional ---                           [phone], Esophagogastroduodenoscopy, flexible,                            transoral; with removal of foreign body(s) Diagnosis Code(s):        --- Professional ---                           O97.353G, Food in esophagus causing other injury,                            initial encounter                           K22.2, Esophageal obstruction                           Q40.2, Other specified congenital malformations of                            stomach                           D92.42, Other complications of other bariatric                            procedure                           T18.2XXA, Foreign body in stomach, initial encounter                           T18.108A, Unspecified foreign body in esophagus                            causing other injury, initial encounter                            R13.14, Dysphagia, pharyngoesophageal phase CPT copyright 2019 American Medical Association. All rights reserved. The codes documented in this report are preliminary and upon coder review may  be revised to meet current compliance requirements. Ladene Artist, MD 03/30/2019 8:41:54 AM This report has been signed electronically. Number of Addenda: 0

## 2019-03-30 NOTE — Interval H&P Note (Signed)
History and Physical Interval Note:  03/30/2019 7:57 AM  Darrell Cunningham  has presented today for surgery, with the diagnosis of esophagaeal food impaction.  The various methods of treatment have been discussed with the patient and family. After consideration of risks, benefits and other options for treatment, the patient has consented to  Procedure(s): ESOPHAGOGASTRODUODENOSCOPY (EGD) WITH PROPOFOL (N/A) as a surgical intervention.  The patient's history has been reviewed, patient examined, no change in status, stable for surgery.  I have reviewed the patient's chart and labs.  Questions were answered to the patient's satisfaction.     Pricilla Riffle. Fuller Plan

## 2019-03-30 NOTE — Progress Notes (Signed)
CHG bath given. Telemetry applied.

## 2019-03-30 NOTE — Transfer of Care (Signed)
Immediate Anesthesia Transfer of Care Note  Patient: Darrell Cunningham  Procedure(s) Performed: ESOPHAGOGASTRODUODENOSCOPY (EGD) WITH PROPOFOL (N/A )  Patient Location: PACU  Anesthesia Type:General  Level of Consciousness: awake, alert , oriented and patient cooperative  Airway & Oxygen Therapy: Patient Spontanous Breathing  Post-op Assessment: Report given to RN and Post -op Vital signs reviewed and unstable, Anesthesiologist notified  Post vital signs: Reviewed and stable  Last Vitals:  Vitals Value Taken Time  BP    Temp    Pulse    Resp    SpO2      Last Pain:  Vitals:   03/30/19 0725  TempSrc: Oral  PainSc: 0-No pain         Complications: No apparent anesthesia complications

## 2019-03-30 NOTE — Progress Notes (Signed)
Pt arrived from ED via stretcher. IVF  NS infusing at 75 cc/hr into Rt chest port. Via alaris pump.  Denies c/o pain or discomfort. No acute distress noted.

## 2019-03-30 NOTE — Progress Notes (Signed)
Pt given discharge instructions, prescriptions, and care notes. Pt verbalized understanding AEB no further questions or concerns at this time. IV was discontinued, no redness, pain, or swelling noted at this time. Telemetry discontinued and Centralized Telemetry was notified. Pt left the floor via wheelchair with staff in stable condition. 

## 2019-03-30 NOTE — ED Provider Notes (Addendum)
[facility] EMERGENCY DEPARTMENT Provider Note   CSN: [id number] Arrival date & time: 03/29/19  2326     History   Chief Complaint Chief Complaint  Patient presents with  . Food Bolus    HPI Darrell Cunningham is a 58 y.o. male.     Patient presents to the emergency department with sensation of food stuck in his esophagus.  Patient has a history of esophageal cancer with complete esophagectomy.  Patient reports that the symptoms began suddenly when he was eating a pork chop.     Past Medical History:  Diagnosis Date  . Allergy   . Arthritis   . Asthma    as a child  . Cancer ([place]) 10/09/2016   ESOPHAGUS CARCINOMA   . COPD (chronic obstructive pulmonary disease) ([place])   . 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 Dysphagia   . Emphysema of lung ([place])   . Encounter for nonprocreative genetic counseling 10/31/2016   Mr. Febus underwent genetic counseling for hereditary cancer syndromes on 10/31/2016. Though he is a candidate for genetic testing, he declines at this time.  Marland Kitchen GERD (gastroesophageal reflux disease)   . Hypertension   . Neuromuscular disorder ([name])   . Pneumonia 4 yrs ago    Patient Active Problem List   Diagnosis Date Noted  . Food impaction of esophagus 03/30/2019  . Esophageal cancer ([place]) 04/16/2017  . Anemia of chronic disease 01/03/2017  . SIRS (systemic inflammatory response syndrome) ([place]) 01/01/2017  . Port catheter in place 12/18/2016  . Hypersensitivity reaction 11/01/2016  . Extravasation accident 11/01/2016  . Encounter for nonprocreative genetic counseling 10/31/2016  . Malignant neoplasm of lower third of esophagus ([place]) 10/15/2016  . 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 Prediabetes   . Asthma     Past Surgical History:  Procedure Laterality Date  . COLONOSCOPY    . COMPLETE ESOPHAGECTOMY N/A 04/16/2017   Procedure: ESOPHAGECTOMY COMPLETE,Transhiatal total  esophagectomy;  Surgeon: [name], MD;  Location: [facility] OR;  Service: Thoracic;  Laterality: N/A;  . EUS N/A 10/26/2016   Procedure: UPPER ENDOSCOPIC ULTRASOUND (EUS) LINEAR;  Surgeon: [name], MD;  Location: [facility] ENDOSCOPY;  Service: Endoscopy;  Laterality: N/A;  . EYE SURGERY Bilateral age 70    for cross eyes  . IR FLUORO GUIDE PORT INSERTION RIGHT  11/08/2016   right upper chest  . IR US GUIDE VASC ACCESS RIGHT  11/08/2016  . JEJUNOSTOMY N/A 04/16/2017   Procedure: Donney Rankins;  Surgeon: [name], MD;  Location: [facility] OR;  Service: Thoracic;  Laterality: N/A;  . JEJUNOSTOMY     removal of feeding tube /pt unsure of date  . MOUTH SURGERY    . UPPER GASTROINTESTINAL ENDOSCOPY    . VIDEO BRONCHOSCOPY N/A 04/16/2017   Procedure: VIDEO BRONCHOSCOPY, Transhiatal Total Esophagectomy, Esophagogastrostomy, pyloromotomy, Feeding Jejunostomy;  Surgeon: [name], MD;  Location: [facility] OR;  Service: Thoracic;  Laterality: N/A;        Home Medications    Prior to Admission medications   Medication Sig Start Date End Date Taking? Authorizing Provider  acetaminophen (TYLENOL) 160 MG/5ML solution Take 10.2 mLs (325 mg total) by mouth every 4 (four) hours as needed for moderate pain. 04/29/17  Yes Barrett, Erin R, PA-C  albuterol (ACCUNEB) 0.63 MG/3ML nebulizer solution Take 1 ampule by nebulization every 6 (six) hours as needed for wheezing.   Yes [provider]  cyclobenzaprine (FLEXERIL)  5 MG tablet Take 1 tablet (5 mg total) by mouth 3 (three) times daily as needed for muscle spasms. 01/07/19  Yes [name], MD  fluticasone (FLONASE) 50 MCG/ACT nasal spray Place 1 spray into both nostrils daily as needed for allergies or rhinitis.   Yes [provider]  fluticasone (FLOVENT DISKUS) 50 MCG/BLIST diskus inhaler Inhale into the lungs.   Yes [provider]  pantoprazole (PROTONIX) 40 MG tablet TAKE 1 TABLET BY MOUTH TWICE A DAY Patient  taking differently: Take 40 mg by mouth 2 (two) times daily.  02/11/18  Yes [name], MD    Family History Family History  Problem Relation Age of Onset  . Colon cancer Maternal Grandmother 71  . Breast cancer Maternal Grandmother 49  . Breast cancer Sister 12       Deceased at 39 of breast cancer  . Ovarian cancer Maternal Aunt   . COPD Father   . Stomach cancer Neg Hx   . Esophageal cancer Neg Hx   . Colon polyps Neg Hx   . Rectal cancer Neg Hx     Social History Social History   Tobacco Use  . Smoking status: Former Smoker    Packs/day: 1.00    Years: 38.00    Pack years: 38.00    Types: Cigarettes    Start date: 10/31/2012    Quit date: 10/31/2012    Years since quitting: 6.4  . Smokeless tobacco: Never Used  Substance Use Topics  . Alcohol use: No    Alcohol/week: 0.0 standard drinks  . Drug use: No     Allergies   Penicillins   Review of Systems Review of Systems  HENT: Positive for trouble swallowing.   Respiratory: Negative for shortness of breath.   Cardiovascular: Negative for chest pain.  All other systems reviewed and are negative.    Physical Exam Updated Vital Signs BP (!) 115/99 (BP Location: Right Arm)   Pulse (!) 102   Temp 98.3 F (36.8 C) (Oral)   Resp 16   SpO2 95%   Physical Exam Vitals signs and nursing note reviewed.  Constitutional:      General: He is not in acute distress.    Appearance: Normal appearance. He is well-developed.  HENT:     Head: Normocephalic and atraumatic.     Right Ear: Hearing normal.     Left Ear: Hearing normal.     Nose: Nose normal.  Eyes:     Conjunctiva/sclera: Conjunctivae normal.     Pupils: Pupils are equal, round, and reactive to light.  Neck:     Musculoskeletal: Normal range of motion and neck supple.  Cardiovascular:     Rate and Rhythm: Regular rhythm.     Heart sounds: S1 normal and S2 normal. No murmur. No friction rub. No gallop.   Pulmonary:     Effort: Pulmonary effort  is normal. No respiratory distress.     Breath sounds: Normal breath sounds.  Chest:     Chest wall: No tenderness.  Abdominal:     General: Bowel sounds are normal.     Palpations: Abdomen is soft.     Tenderness: There is no abdominal tenderness. There is no guarding or rebound. Negative signs include Murphy's sign and McBurney's sign.     Hernia: No hernia is present.  Musculoskeletal: Normal range of motion.  Skin:    General: Skin is warm and dry.     Findings: No rash.  Neurological:  Mental Status: He is alert and oriented to person, place, and time.     GCS: GCS eye subscore is 4. GCS verbal subscore is 5. GCS motor subscore is 6.     Cranial Nerves: No cranial nerve deficit.     Sensory: No sensory deficit.     Coordination: Coordination normal.  Psychiatric:        Speech: Speech normal.        Behavior: Behavior normal.        Thought Content: Thought content normal.      ED Treatments / Results  Labs (all labs ordered are listed, but only abnormal results are displayed) Labs Reviewed  SARS CORONAVIRUS 2 (HOSPITAL ORDER, [facility])  HIV ANTIBODY (ROUTINE TESTING W REFLEX)  CBC  COMPREHENSIVE METABOLIC PANEL  PROTIME-INR    EKG None  Radiology No results found.  Procedures Procedures (including critical care time)  Medications Ordered in ED Medications  0.9 %  sodium chloride infusion ( Intravenous New Bag/Given 03/30/19 0548)  fentaNYL (SUBLIMAZE) 100 MCG/2ML injection (has no administration in time range)     Initial Impression / Assessment and Plan / ED Course  I have reviewed the triage vital signs and the nursing notes.  Pertinent labs & imaging results that were available during my care of the patient were reviewed by me and considered in my medical decision making (see chart for details).        Patient presents to the emergency department for evaluation of esophageal food bolus impaction.  Patient has had  previous esophagectomy secondary to esophageal cancer.  He had concomitant radiation therapy.  He has required esophageal dilatations after the surgery (small amount of residual esophagus) in the past.  He will require evaluation by GI.  Discussed with Dr. Fuller Plan, request patient to be admitted by medicine and will perform EGD first thing in the morning.  Final Clinical Impressions(s) / ED Diagnoses   Final diagnoses:  Esophageal obstruction due to food impaction    ED Discharge Orders    None       Orpah Greek, MD 03/30/19 [id number]    Orpah Greek, MD 04/15/19 [phone]

## 2019-03-30 NOTE — ED Notes (Signed)
admitting Provider at bedside. 

## 2019-03-30 NOTE — Anesthesia Preprocedure Evaluation (Addendum)
Anesthesia Evaluation  Patient identified by MRN, date of birth, ID band Patient awake    Reviewed: Allergy & Precautions, NPO status , Patient's Chart, lab work & pertinent test results  Airway Mallampati: II  TM Distance: <3 FB Neck ROM: Full    Dental  (+) Dental Advisory Given, Edentulous Lower, Edentulous Upper   Pulmonary asthma , COPD, former smoker,    Pulmonary exam normal breath sounds clear to auscultation       Cardiovascular hypertension, negative cardio ROS Normal cardiovascular exam Rhythm:Regular Rate:Normal     Neuro/Psych  Neuromuscular disease negative psych ROS   GI/Hepatic Neg liver ROS, GERD  Medicated,esophagaeal food impaction Dysphagia 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  Reproductive/Obstetrics                            Anesthesia Physical Anesthesia Plan  ASA: III  Anesthesia Plan: MAC   Post-op Pain Management:    Induction: Intravenous  PONV Risk Score and Plan: 1 and Propofol infusion  Airway Management Planned: Natural Airway  Additional Equipment:   Intra-op Plan:   Post-operative Plan:   Informed Consent: I have reviewed the patients History and Physical, chart, labs and discussed the procedure including the risks, benefits and alternatives for the proposed anesthesia with the patient or authorized representative who has indicated his/her understanding and acceptance.     Dental advisory given  Plan Discussed with: CRNA  Anesthesia Plan Comments:          Anesthesia Quick Evaluation

## 2019-03-30 NOTE — Anesthesia Postprocedure Evaluation (Signed)
Anesthesia Post Note  Patient: [name]  Procedure(s) Performed: ESOPHAGOGASTRODUODENOSCOPY (EGD) WITH PROPOFOL (N/A ) FOREIGN BODY REMOVAL     Patient location during evaluation: PACU Anesthesia Type: General Level of consciousness: awake and alert Pain management: pain level controlled Vital Signs Assessment: post-procedure vital signs reviewed and stable Respiratory status: spontaneous breathing, nonlabored ventilation, respiratory function stable and patient connected to nasal cannula oxygen Cardiovascular status: blood pressure returned to baseline and stable Postop Assessment: no apparent nausea or vomiting Anesthetic complications: no    Last Vitals:  Vitals:   03/30/19 1110 03/30/19 1314  BP:  127/77  Pulse:  90  Resp:  18  Temp:  36.7 C  SpO2: 95% 99%    Last Pain:  Vitals:   03/30/19 0930  TempSrc: Oral  PainSc: 0-No pain                 Catalina Gravel

## 2019-03-30 NOTE — Discharge Summary (Signed)
Physician Discharge Summary  SULEMAN WOOLS [id number] DOB: 01/16/1961 DOA: 03/29/2019  PCP: [name], MD  Admit date: 03/29/2019 Discharge date: 03/30/2019  Admitted From: home Discharge disposition: home   Recommendations for Outpatient Follow-Up:   1. Outpatient EGD/dilitation   Discharge Diagnosis:   Principal Problem:   Food impaction of esophagus Active Problems:   Asthma   Esophageal obstruction due to food impaction    Discharge Condition: Improved.  Diet recommendation: - Clear liquid diet today, then advance as                            tolerated to soft diet until EGD/dilation. No meat                            or bread until EGD/dilation.  Wound care: None.  Code status: Full.   History of Present Illness:   Darrell Cunningham  is a 58 y.o. 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 Pt also notes has been having more Gerd than normal.    Hospital Course by Problem:   - Food impaction in the proximal esophagus. Removal was successful.                           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                           - Ectopic gastric mucosa in the proximal esophagus.                           - Prior esophagectomy, gastric pull up. Sutures                            noted in gastric body.                           - A medium amount of food (residue) in the stomach                            that obscured visualization.                           - Normal duodenal bulb and second portion of the                            duodenum. Recommendation:           - Patient has a contact number available for                            emergencies. The signs and symptoms of potential                 delayed complications were discussed with the  patient. Return to normal activities tomorrow.                            Written discharge instructions were provided to the                            patient.                           - Clear liquid diet today, then advance as                            tolerated to soft diet until EGD/dilation. No meat                            or bread until EGD/dilation.                           - Continue present medications.                           - Schedule EGD/dilation with Dr. [name].                           - Discharge home today.  Hypokalemia -replete -outpatient follow up  Medical Consultants:   GI   Discharge Exam:   Vitals:   03/30/19 0930 03/30/19 1110  BP: 113/73   Pulse: 81   Resp:    Temp: 98.3 F (36.8 C)   SpO2: 100% 95%   Vitals:   03/30/19 0845 03/30/19 0850 03/30/19 0930 03/30/19 1110  BP: 111/72  113/73   Pulse: 90 84 81   Resp: 19 17    Temp:   98.3 F (36.8 C)   TempSrc:   Oral   SpO2: 99% 100% 100% 95%  Weight:      Height:        General exam: Appears calm and comfortable. .    The results of significant diagnostics from this hospitalization (including imaging, microbiology, ancillary and laboratory) are listed below for reference.     Procedures and Diagnostic Studies:   No results found.   Labs:   Basic Metabolic Panel: Recent Labs  Lab 03/30/19 0645  NA 137  K 3.3*  CL 104  CO2 22  GLUCOSE 124*  BUN 17  CREATININE 0.72  CALCIUM 9.0   GFR Estimated Creatinine Clearance: 69.4 mL/min (by C-G formula based on SCr of 0.72 mg/dL). Liver Function Tests: Recent Labs  Lab 03/30/19 0645  AST 18  ALT 17  ALKPHOS 74  BILITOT 0.5  PROT 7.1  ALBUMIN 3.7   No results for input(s): LIPASE, AMYLASE in the last 168 hours. No results for input(s): AMMONIA in the last 168 hours. Coagulation profile Recent Labs  Lab 03/30/19 0645  INR 1.0     CBC: Recent Labs  Lab 03/30/19 0645  WBC 7.0  HGB 14.5  HCT 43.1  MCV 91.3  PLT 199   Cardiac Enzymes: No results for input(s): CKTOTAL, CKMB, CKMBINDEX, TROPONINI in the last 168 hours. BNP: Invalid input(s): POCBNP CBG: No results for input(s): GLUCAP in the last 168 hours. D-Dimer No results for input(s): DDIMER in the last  72 hours. Hgb A1c No results for input(s): HGBA1C in the last 72 hours. Lipid Profile No results for input(s): CHOL, HDL, LDLCALC, TRIG, CHOLHDL, LDLDIRECT in the last 72 hours. Thyroid function studies No results for input(s): TSH, T4TOTAL, T3FREE, THYROIDAB in the last 72 hours.  Invalid input(s): FREET3 Anemia work up No results for input(s): VITAMINB12, FOLATE, FERRITIN, TIBC, IRON, RETICCTPCT in the last 72 hours. Microbiology Recent Results (from the past 240 hour(s))  SARS Coronavirus 2 [facility] order, Performed in [facility] hospital lab) Nasopharyngeal Nasopharyngeal Swab     Status: None   Collection Time: 03/30/19 12:59 AM   Specimen: Nasopharyngeal Swab  Result Value Ref Range Status   SARS Coronavirus 2 NEGATIVE NEGATIVE Final    Comment: (NOTE) If result is NEGATIVE SARS-CoV-2 target nucleic acids are NOT DETECTED. The SARS-CoV-2 RNA is generally detectable in upper and lower  respiratory specimens during the acute phase of infection. The lowest  concentration of SARS-CoV-2 viral copies this assay can detect is 250  copies / mL. A negative result does not preclude SARS-CoV-2 infection  and should not be used as the sole basis for treatment or other  patient management decisions.  A negative result may occur with  improper specimen collection / handling, submission of specimen other  than nasopharyngeal swab, presence of viral mutation(s) within the  areas targeted by this assay, and inadequate number of viral copies  (<250 copies / mL). A negative result must be combined with clinical  observations, patient history, and  epidemiological information. If result is POSITIVE SARS-CoV-2 target nucleic acids are DETECTED. The SARS-CoV-2 RNA is generally detectable in upper and lower  respiratory specimens dur ing the acute phase of infection.  Positive  results are indicative of active infection with SARS-CoV-2.  Clinical  correlation with patient history and other diagnostic information is  necessary to determine patient infection status.  Positive results do  not rule out bacterial infection or co-infection with other viruses. If result is PRESUMPTIVE POSTIVE SARS-CoV-2 nucleic acids MAY BE PRESENT.   A presumptive positive result was obtained on the submitted specimen  and confirmed on repeat testing.  While 2019 novel coronavirus  (SARS-CoV-2) nucleic acids may be present in the submitted sample  additional confirmatory testing may be necessary for epidemiological  and / or clinical management purposes  to differentiate between  SARS-CoV-2 and other Sarbecovirus currently known to infect humans.  If clinically indicated additional testing with an alternate test  methodology [phone]) is advised. The SARS-CoV-2 RNA is generally  detectable in upper and lower respiratory sp ecimens during the acute  phase of infection. The expected result is Negative. Fact Sheet for Patients:  StrictlyIdeas.no Fact Sheet for Healthcare Providers: BankingDealers.co.za This test is not yet approved or cleared by the [place] FDA and has been authorized for detection and/or diagnosis of SARS-CoV-2 by FDA under an Emergency Use Authorization (EUA).  This EUA will remain in effect (meaning this test can be used) for the duration of the COVID-19 declaration under Section 564(b)(1) of the Act, 21 U.S.C. section 360bbb-3(b)(1), unless the authorization is terminated or revoked sooner. Performed at [facility], [place] [address], [place], [place] [address]       Discharge Instructions:   Discharge Instructions    Discharge instructions   Complete by: As directed    Clear liquid diet today, then advance as tolerated to soft diet until EGD/dilation. No meat or bread until EGD/dilation.   Increase activity slowly  Complete by: As directed      Allergies as of 03/30/2019      Reactions   Penicillins Hives, Itching, Swelling   Has patient had a PCN reaction causing immediate rash, facial/tongue/throat swelling, SOB or lightheadedness with hypotension: No Has patient had a PCN reaction causing severe rash involving mucus membranes or skin necrosis: No Has patient had a PCN reaction that required hospitalization No Has patient had a PCN reaction occurring within the last 10 years: No If all of the above answers are "NO", then may proceed with Cephalosporin use. Unknown Childhood reaction      Medication List    TAKE these medications   acetaminophen 160 MG/5ML solution Commonly known as: TYLENOL Take 10.2 mLs (325 mg total) by mouth every 4 (four) hours as needed for moderate pain.   albuterol 0.63 MG/3ML nebulizer solution Commonly known as: ACCUNEB Take 1 ampule by nebulization every 6 (six) hours as needed for wheezing.   cyclobenzaprine 5 MG tablet Commonly known as: FLEXERIL Take 1 tablet (5 mg total) by mouth 3 (three) times daily as needed for muscle spasms.   fluticasone 50 MCG/ACT nasal spray Commonly known as: FLONASE Place 1 spray into both nostrils daily as needed for allergies or rhinitis.   fluticasone 50 MCG/BLIST diskus inhaler Commonly known as: FLOVENT DISKUS Inhale into the lungs.   pantoprazole 40 MG tablet Commonly known as: PROTONIX TAKE 1 TABLET BY MOUTH TWICE A DAY      Follow-up Information    [name], MD Follow up in 1 week(s).   Specialty: Family Medicine Contact information: [address] [place] [address] [phone]        [name], MD Follow up.   Specialty:  Gastroenterology Why: for EGD/dilitation Contact information: [address] [place] [address] [phone]            Time coordinating discharge: 25 min  Signed:  Geradine Girt DO  [facility] 03/30/2019, 12:31 PM

## 2019-03-30 NOTE — Anesthesia Procedure Notes (Signed)
Procedure Name: Intubation Date/Time: 03/30/2019 6:16 PM Performed by: Oletta Lamas, CRNA Pre-anesthesia Checklist: Patient identified, Emergency Drugs available, Suction available and Patient being monitored Patient Re-evaluated:Patient Re-evaluated prior to induction Oxygen Delivery Method: Circle System Utilized Preoxygenation: Pre-oxygenation with 100% oxygen Induction Type: IV induction, Rapid sequence and Cricoid Pressure applied Laryngoscope Size: Mac and 3 Grade View: Grade I Tube type: Oral Tube size: 7.0 mm Number of attempts: 1 Airway Equipment and Method: Stylet Placement Confirmation: ETT inserted through vocal cords under direct vision,  positive ETCO2 and breath sounds checked- equal and bilateral Secured at: 23 cm Tube secured with: Tape Dental Injury: Teeth and Oropharynx as per pre-operative assessment

## 2019-03-30 NOTE — ED Notes (Signed)
ED TO INPATIENT HANDOFF REPORT  ED Nurse Name and Phone #: Ladanian Kelter [phone]  S Name/Age/Gender [name] 58 y.o. male Room/Bed: H021C/H021C  Code Status   Code Status: Full Code  Home/SNF/Other Home Patient oriented to: self, place, time and situation Is this baseline? Yes   Triage Complete: Triage complete  Chief Complaint object in throat   Triage Note Patient with food bolus.  He states that he was eating pork chop and has a piece stuck in his throat.  He has been able to swallow his sections but unable to keep water down.     Allergies Allergies  Allergen Reactions  . Penicillins Hives, Itching and Swelling    Has patient had a PCN reaction causing immediate rash, facial/tongue/throat swelling, SOB or lightheadedness with hypotension: No Has patient had a PCN reaction causing severe rash involving mucus membranes or skin necrosis: No Has patient had a PCN reaction that required hospitalization No Has patient had a PCN reaction occurring within the last 10 years: No If all of the above answers are "NO", then may proceed with Cephalosporin use. Unknown Childhood reaction    Level of Care/Admitting Diagnosis ED Disposition    ED Disposition Condition Comment   Admit  Hospital Area: [place] [[id number]]  Level of Care: Telemetry Medical [104]  I expect the patient will be discharged within 24 hours: Yes  LOW acuity---Tx typically complete <24 hrs---ACUTE conditions typically can be evaluated <24 hours---LABS likely to return to acceptable levels <24 hours---IS near functional baseline---EXPECTED to return to current living arrangement---NOT newly hypoxic: Meets criteria for 5C-Observation unit  Covid Evaluation: Asymptomatic Screening Protocol (No Symptoms)  Diagnosis: Food impaction of esophagus [id number]  Admitting Physician: Jani Gravel [[id number]]  Attending Physician: Jani Gravel [[id number]]  PT Class (Do Not Modify): Observation [104]  PT Acc Code (Do  Not Modify): Observation [10022]       B Medical/Surgery History Past Medical History:  Diagnosis Date  . Allergy   . Arthritis   . Asthma    as a child  . Cancer ([place]) 10/09/2016   ESOPHAGUS CARCINOMA   . COPD (chronic obstructive pulmonary disease) ([place])   . CP (cerebral palsy), spastic ([place])    right  . Dysphagia   . Emphysema of lung ([place])   . Encounter for nonprocreative genetic counseling 10/31/2016   Mr. Jackson underwent genetic counseling for hereditary cancer syndromes on 10/31/2016. Though he is a candidate for genetic testing, he declines at this time.  Marland Kitchen GERD (gastroesophageal reflux disease)   . Hypertension   . Neuromuscular disorder ([place])   . Pneumonia 4 yrs ago   Past Surgical History:  Procedure Laterality Date  . COLONOSCOPY    . COMPLETE ESOPHAGECTOMY N/A 04/16/2017   Procedure: ESOPHAGECTOMY COMPLETE,Transhiatal total esophagectomy;  Surgeon: [name], MD;  Location: [facility] OR;  Service: Thoracic;  Laterality: N/A;  . EUS N/A 10/26/2016   Procedure: UPPER ENDOSCOPIC ULTRASOUND (EUS) LINEAR;  Surgeon: [name], MD;  Location: [facility] ENDOSCOPY;  Service: Endoscopy;  Laterality: N/A;  . EYE SURGERY Bilateral age 31    for cross eyes  . IR FLUORO GUIDE PORT INSERTION RIGHT  11/08/2016   right upper chest  . IR US GUIDE VASC ACCESS RIGHT  11/08/2016  . JEJUNOSTOMY N/A 04/16/2017   Procedure: Donney Rankins;  Surgeon: [name], MD;  Location: [facility] OR;  Service: Thoracic;  Laterality: N/A;  . JEJUNOSTOMY     removal of feeding tube /pt unsure  of date  . MOUTH SURGERY    . UPPER GASTROINTESTINAL ENDOSCOPY    . VIDEO BRONCHOSCOPY N/A 04/16/2017   Procedure: VIDEO BRONCHOSCOPY, Transhiatal Total Esophagectomy, Esophagogastrostomy, pyloromotomy, Feeding Jejunostomy;  Surgeon: [name], MD;  Location: [facility] OR;  Service: Thoracic;  Laterality: N/A;     A IV Location/Drains/Wounds Patient Lines/Drains/Airways Status   Active  Line/Drains/Airways    Name:   Placement date:   Placement time:   Site:   Days:   Implanted Port 11/08/16 Right Chest   11/08/16    1005    Chest   872   Implanted Port   -    -    -      Peripheral IV 10/31/16 Right;Posterior;Medial Arm   10/31/16    1350    Arm   880   Peripheral IV 01/01/18 Left Antecubital   01/01/18    0946    Antecubital   453   Gastrostomy/Enterostomy Jejunostomy 18 Fr. LUQ   04/16/17    1447    LUQ   713   Incision (Closed) 04/16/17 Abdomen Other (Comment)   04/16/17    1532     713   Incision (Closed) 04/16/17 Neck Left   04/16/17    1532     713          Intake/Output Last 24 hours No intake or output data in the 24 hours ending 03/30/19 0535  Labs/Imaging Results for orders placed or performed during the hospital encounter of 03/29/19 (from the past 48 hour(s))  SARS Coronavirus 2 [facility] order, Performed in [facility] hospital lab) Nasopharyngeal Nasopharyngeal Swab     Status: None   Collection Time: 03/30/19 12:59 AM   Specimen: Nasopharyngeal Swab  Result Value Ref Range   SARS Coronavirus 2 NEGATIVE NEGATIVE    Comment: (NOTE) If result is NEGATIVE SARS-CoV-2 target nucleic acids are NOT DETECTED. The SARS-CoV-2 RNA is generally detectable in upper and lower  respiratory specimens during the acute phase of infection. The lowest  concentration of SARS-CoV-2 viral copies this assay can detect is 250  copies / mL. A negative result does not preclude SARS-CoV-2 infection  and should not be used as the sole basis for treatment or other  patient management decisions.  A negative result may occur with  improper specimen collection / handling, submission of specimen other  than nasopharyngeal swab, presence of viral mutation(s) within the  areas targeted by this assay, and inadequate number of viral copies  (<250 copies / mL). A negative result must be combined with clinical  observations, patient history, and epidemiological information. If result  is POSITIVE SARS-CoV-2 target nucleic acids are DETECTED. The SARS-CoV-2 RNA is generally detectable in upper and lower  respiratory specimens dur ing the acute phase of infection.  Positive  results are indicative of active infection with SARS-CoV-2.  Clinical  correlation with patient history and other diagnostic information is  necessary to determine patient infection status.  Positive results do  not rule out bacterial infection or co-infection with other viruses. If result is PRESUMPTIVE POSTIVE SARS-CoV-2 nucleic acids MAY BE PRESENT.   A presumptive positive result was obtained on the submitted specimen  and confirmed on repeat testing.  While 2019 novel coronavirus  (SARS-CoV-2) nucleic acids may be present in the submitted sample  additional confirmatory testing may be necessary for epidemiological  and / or clinical management purposes  to differentiate between  SARS-CoV-2 and other Sarbecovirus currently known to infect  humans.  If clinically indicated additional testing with an alternate test  methodology [phone]) is advised. The SARS-CoV-2 RNA is generally  detectable in upper and lower respiratory sp ecimens during the acute  phase of infection. The expected result is Negative. Fact Sheet for Patients:  StrictlyIdeas.no Fact Sheet for Healthcare Providers: BankingDealers.co.za This test is not yet approved or cleared by the [place] FDA and has been authorized for detection and/or diagnosis of SARS-CoV-2 by FDA under an Emergency Use Authorization (EUA).  This EUA will remain in effect (meaning this test can be used) for the duration of the COVID-19 declaration under Section 564(b)(1) of the Act, 21 U.S.C. section 360bbb-3(b)(1), unless the authorization is terminated or revoked sooner. Performed at [facility], [place] [address], [place], [place] [address]    No results found.  Pending Labs Unresulted Labs  (From admission, onward)    Start     Ordered   03/30/19 0247  HIV antibody (Routine Testing)  Once,   STAT     03/30/19 0250          Vitals/Pain Today's Vitals   03/29/19 2328 03/29/19 2335 03/30/19 0439  BP: 131/86  (!) 115/99  Pulse: 95  (!) 102  Resp: 18  16  Temp: 98.3 F (36.8 C)    TempSrc: Oral    SpO2: 99%  95%  PainSc:  0-No pain     Isolation Precautions No active isolations  Medications Medications  0.9 %  sodium chloride infusion (has no administration in time range)    Mobility walks Low fall risk   Focused Assessments pt has hx of esophageal cancer, food bolus, to be seen at 0730 for EGD   R Recommendations: See Admitting Provider Note  Report given to:   Additional Notes:

## 2019-03-31 ENCOUNTER — Telehealth: Payer: Self-pay

## 2019-03-31 NOTE — Telephone Encounter (Signed)
I attempted to reach the patient to schedule direct EGD and dilation per EGD report from 03/30/19.  Patient's phone rings once and then has a busy signal.  Attempted multiple times throughout the day. He has follow up arranged with Dr. [name] for 05/05/19

## 2019-05-05 ENCOUNTER — Ambulatory Visit: Payer: Medicaid Other | Admitting: Internal Medicine

## 2019-05-05 ENCOUNTER — Encounter: Payer: Self-pay | Admitting: Internal Medicine

## 2019-05-05 VITALS — BP 116/68 | HR 77 | Temp 98.5°F | Ht 60.0 in | Wt 133.5 lb

## 2019-05-05 DIAGNOSIS — Z8501 Personal history of malignant neoplasm of esophagus: Secondary | ICD-10-CM | POA: Diagnosis not present

## 2019-05-05 DIAGNOSIS — R1319 Other dysphagia: Secondary | ICD-10-CM

## 2019-05-05 DIAGNOSIS — K222 Esophageal obstruction: Secondary | ICD-10-CM

## 2019-05-05 DIAGNOSIS — R1310 Dysphagia, unspecified: Secondary | ICD-10-CM

## 2019-05-05 NOTE — Patient Instructions (Signed)
You have been scheduled for an endoscopy on 05/26/2019 @10 :00am please arrive at 9:00am. Please follow the written instructions given to you at your visit today.  If you use inhalers (even only as needed), please bring them with you on the day of your procedure.   If you are age 60 or older, your body mass index should be between 23-30. Your Body mass index is 26.07 kg/m. If this is out of the aforementioned range listed, please consider follow up with your Primary Care Provider.  If you are age 13 or younger, your body mass index should be between 19-25. Your Body mass index is 26.07 kg/m. If this is out of the aformentioned range listed, please consider follow up with your Primary Care Provider.  ' Thank you for choosing me and [facility]

## 2019-05-05 NOTE — Progress Notes (Signed)
   Subjective:    Patient ID: Darrell Cunningham, male    DOB: 10-22-1960, 58 y.o.   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  He is here alone today.  I have performed multiple prior upper endoscopies for dilation of his anastomotic stricture.  We had also performed triamcinolone injection after dilation on several occasions which had significantly helped decrease the frequency of dilation.  He had significantly improved dysphagia.  I last performed an endoscopy in February 2020 with dilation of the anastomotic stricture to 19 mm with balloon followed by triamcinolone injection.  He had been doing well but then he had an esophageal food impaction after eating a pork chop.  This required an urgent upper endoscopy performed by my partner, Dr. Fuller Plan, on 03/30/2019.  The food was removed but recurrent stricture was seen.  He reports he is doing well.  He does have some issues swallowing meats and bread.  Still better than it was sometime ago.  No abdominal pain.  Regular bowel movements.   Review of Systems As per HPI, otherwise negative  Current Medications, Allergies, Past Medical History, Past Surgical History, Family History and Social History were reviewed in [employer] record.      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   1.  History of esophageal adenocarcinoma in remission/anastomotic stricture at esophagogastric junction/recent food impaction --he had done well with esophageal dilation and triamcinolone injection but had a recent food impaction.  We discussed this and it warrants repeat upper endoscopy for dilation.  We will schedule this and I will again plan triamcinolone injection after esophageal dilation.  We discussed the risk, benefits and alternatives and he is agreeable and wishes to proceed.  Soft diet until endoscopy with dilation.  He will continue pantoprazole  2.  History of adenomatous colon polyp --history of adenoma and March 2016, recall colonoscopy for March 2021  25 minutes spent with the patient today. Greater than 50% was spent in counseling and coordination of care with the patient  .

## 2019-05-13 ENCOUNTER — Other Ambulatory Visit: Payer: Self-pay

## 2019-05-13 ENCOUNTER — Ambulatory Visit (INDEPENDENT_AMBULATORY_CARE_PROVIDER_SITE_OTHER): Payer: Medicaid Other

## 2019-05-13 DIAGNOSIS — Z23 Encounter for immunization: Secondary | ICD-10-CM | POA: Diagnosis not present

## 2019-05-26 ENCOUNTER — Other Ambulatory Visit: Payer: Self-pay

## 2019-05-26 ENCOUNTER — Encounter: Payer: Self-pay | Admitting: Internal Medicine

## 2019-05-26 ENCOUNTER — Ambulatory Visit (AMBULATORY_SURGERY_CENTER): Payer: Medicaid Other | Admitting: Internal Medicine

## 2019-05-26 VITALS — BP 105/62 | HR 77 | Temp 96.2°F | Resp 18 | Ht 60.0 in | Wt 133.0 lb

## 2019-05-26 DIAGNOSIS — K222 Esophageal obstruction: Secondary | ICD-10-CM | POA: Diagnosis not present

## 2019-05-26 DIAGNOSIS — Z8501 Personal history of malignant neoplasm of esophagus: Secondary | ICD-10-CM

## 2019-05-26 DIAGNOSIS — R1310 Dysphagia, unspecified: Secondary | ICD-10-CM | POA: Diagnosis not present

## 2019-05-26 DIAGNOSIS — J449 Chronic obstructive pulmonary disease, unspecified: Secondary | ICD-10-CM | POA: Diagnosis not present

## 2019-05-26 DIAGNOSIS — G809 Cerebral palsy, unspecified: Secondary | ICD-10-CM | POA: Diagnosis not present

## 2019-05-26 DIAGNOSIS — K219 Gastro-esophageal reflux disease without esophagitis: Secondary | ICD-10-CM | POA: Diagnosis not present

## 2019-05-26 DIAGNOSIS — K3189 Other diseases of stomach and duodenum: Secondary | ICD-10-CM | POA: Diagnosis not present

## 2019-05-26 DIAGNOSIS — R1319 Other dysphagia: Secondary | ICD-10-CM

## 2019-05-26 MED ORDER — SODIUM CHLORIDE 0.9 % IV SOLN: 500.0000 mL | Freq: Once | INTRAVENOUS | Status: DC

## 2019-05-26 NOTE — Patient Instructions (Signed)
PLEASE FOLLOW THE POST DILATION DIET TODAY AND RESUME PREVIOUS DIET TOMORROW.  YOU MAY RESUME YOUR CURRENT MEDICATION SCHEDULE.  REPEAT EGD IN 6 MONTHS TO EVALUATE THE RESPONSE.  [place] YOU FOR ALLOWING [place] TO CARE FOR YOU TODAY!!  YOU HAD AN ENDOSCOPIC PROCEDURE TODAY AT [facility]:   Refer to the procedure report that was given to you for any specific questions about what was found during the examination.  If the procedure report does not answer your questions, please call your gastroenterologist to clarify.  If you requested that your care partner not be given the details of your procedure findings, then the procedure report has been included in a sealed envelope for you to review at your convenience later.  YOU SHOULD EXPECT: Some feelings of bloating in the abdomen. Passage of more gas than usual.  Walking can help get rid of the air that was put into your GI tract during the procedure and reduce the bloating. If you had a lower endoscopy (such as a colonoscopy or flexible sigmoidoscopy) you may notice spotting of blood in your stool or on the toilet paper. If you underwent a bowel prep for your procedure, you may not have a normal bowel movement for a few days.  Please Note:  You might notice some irritation and congestion in your nose or some drainage.  This is from the oxygen used during your procedure.  There is no need for concern and it should clear up in a day or so.  SYMPTOMS TO REPORT IMMEDIATELY:   Following upper endoscopy (EGD)  Vomiting of blood or coffee ground material  New chest pain or pain under the shoulder blades  Painful or persistently difficult swallowing  New shortness of breath  Fever of 100F or higher  Black, tarry-looking stools  For urgent or emergent issues, a gastroenterologist can be reached at any hour by calling [phone].   DIET:  We do recommend a small meal at first, but then you may proceed to your regular diet.  Drink plenty  of fluids but you should avoid alcoholic beverages for 24 hours.  ACTIVITY:  You should plan to take it easy for the rest of today and you should NOT DRIVE or use heavy machinery until tomorrow (because of the sedation medicines used during the test).    FOLLOW UP: Our staff will call the number listed on your records 48-72 hours following your procedure to check on you and address any questions or concerns that you may have regarding the information given to you following your procedure. If we do not reach you, we will leave a message.  We will attempt to reach you two times.  During this call, we will ask if you have developed any symptoms of COVID 19. If you develop any symptoms (ie: fever, flu-like symptoms, shortness of breath, cough etc.) before then, please call [phone].  If you test positive for Covid 19 in the 2 weeks post procedure, please call and report this information to [place].    If any biopsies were taken you will be contacted by phone or by letter within the next 1-3 weeks.  Please call us at [phone] if you have not heard about the biopsies in 3 weeks.    SIGNATURES/CONFIDENTIALITY: You and/or your care partner have signed paperwork which will be entered into your electronic medical record.  These signatures attest to the fact that that the information above on your After Visit Summary has been reviewed  and is understood.  Full responsibility of the confidentiality of this discharge information lies with you and/or your care-partner. 

## 2019-05-26 NOTE — Progress Notes (Signed)
Called to room to assist during endoscopic procedure.  Patient ID and intended procedure confirmed with present staff. Received instructions for my participation in the procedure from the performing physician.  

## 2019-05-26 NOTE — Op Note (Signed)
[place] Patient Name: Darrell Cunningham Procedure Date: 05/26/2019 9:47 AM MRN: [id number] Endoscopist: [name] , MD Age: 58 Referring MD:  Date of Birth: 23-Dec-1960 Gender: Male Account #: [id number] Procedure:                Upper GI endoscopy Indications:              Dysphagia, For therapy of post-surgical anastomotic                            stenosis after esophagectomy, last dilation in Feb                            2020 with intralesional steroid injection; EGD Aug                            2020 for food impaction at anastomotic stricture Medicines:                Monitored Anesthesia Care Procedure:                Pre-Anesthesia Assessment:                           - Prior to the procedure, a History and Physical                            was performed, and patient medications and                            allergies were reviewed. The patient's tolerance of                            previous anesthesia was also reviewed. The risks                            and benefits of the procedure and the sedation                            options and risks were discussed with the patient.                            All questions were answered, and informed consent                            was obtained. Prior Anticoagulants: The patient has                            taken no previous anticoagulant or antiplatelet                            agents. ASA Grade Assessment: III - A patient with                            severe systemic disease. After reviewing the risks  and benefits, the patient was deemed in                            satisfactory condition to undergo the procedure.                           After obtaining informed consent, the endoscope was                            passed under direct vision. Throughout the                            procedure, the patient's blood pressure, pulse, and   oxygen saturations were monitored continuously. The                            Endoscope was introduced through the mouth, and                            advanced to the second part of duodenum. The upper                            GI endoscopy was accomplished without difficulty.                            The patient tolerated the procedure well. Scope In: Scope Out: Findings:                 An esophago-gastric anastomosis was found in the                            upper third of the esophagus. This appears more                            patent than in the past.                           One benign-appearing, intrinsic moderate                            (circumferential scarring or stenosis; an endoscope                            may pass) stenosis was found 20 cm from the                            incisors. This stenosis measured 1 cm (inner                            diameter) x less than one cm (in length). The                            stenosis was traversed. A TTS dilator was passed  through the scope. Dilation with an 18-19-20 mm                            balloon dilator was performed to 19 mm. The                            dilation site was examined and showed moderate                            mucosal disruption. Area was successfully injected                            with 0.5 mL of triamcinolone (80 mg/mL) for drug                            delivery; 10 mg each in four quadrants for 40 mg                            total.                           Evidence of a previous surgical anastomosis was                            found in the cardia, in the gastric fundus and in                            the gastric body. This was characterized by visible                            sutures.                           A single 2 mm angioectasia with no bleeding was                            found in the gastric body.                           The  exam of the stomach was otherwise normal.                           The examined duodenum was normal. Complications:            No immediate complications. Estimated Blood Loss:     Estimated blood loss was minimal. Impression:               - Benign-appearing esophageal stenosis at                            anastomosis. Dilated to 19 mm with balloon.                            Injected with steroid.                           -  A single non-bleeding angioectasia in the stomach.                           - Normal examined duodenum.                           - No specimens collected. Recommendation:           - Patient has a contact number available for                            emergencies. The signs and symptoms of potential                            delayed complications were discussed with the                            patient. Return to normal activities tomorrow.                            Written discharge instructions were provided to the                            patient.                           - Resume previous diet after post-dilation protocol.                           - Continue present medications.                           - Repeat upper endoscopy in 6 months to evaluate                            the response to therapy. [name], MD 05/26/2019 10:17:41 AM This report has been signed electronically.

## 2019-05-26 NOTE — Progress Notes (Signed)
Report to PACU, RN, vss, BBS= Clear.  

## 2019-05-28 ENCOUNTER — Telehealth: Payer: Self-pay

## 2019-05-28 NOTE — Telephone Encounter (Signed)
  Follow up Call-  Call back number 05/26/2019 09/24/2018 08/19/2018 07/04/2018 06/03/2018 04/15/2018 02/28/2018  Post procedure Call Back phone  # [phone] [id number] [phone]  Permission to leave phone message Yes Yes Yes Yes Yes Yes Yes  Some recent data might be hidden     Patient questions:  Do you have a fever, pain , or abdominal swelling? No. Pain Score  0 *  Have you tolerated food without any problems? Yes.    Have you been able to return to your normal activities? Yes.    Do you have any questions about your discharge instructions: Diet   No. Medications  No. Follow up visit  No.  Do you have questions or concerns about your Care? No.  Actions: * If pain score is 4 or above: No action needed, pain <4.  1. Have you developed a fever since your procedure? no  2.   Have you had an respiratory symptoms (SOB or cough) since your procedure? no  3.   Have you tested positive for COVID 19 since your procedure no  4.   Have you had any family members/close contacts diagnosed with the COVID 19 since your procedure?  no   If yes to any of these questions please route to Joylene John, RN and Alphonsa Gin, Therapist, sports.

## 2019-06-02 ENCOUNTER — Other Ambulatory Visit: Payer: Self-pay | Admitting: Family Medicine

## 2019-06-02 DIAGNOSIS — K222 Esophageal obstruction: Secondary | ICD-10-CM

## 2019-06-02 DIAGNOSIS — K219 Gastro-esophageal reflux disease without esophagitis: Secondary | ICD-10-CM

## 2019-06-11 NOTE — Progress Notes (Signed)
Darrell Cunningham   [phone] Fax:(336) [phone]   Clinic Follow up Note   Patient Care Team: [name], MD as PCP - General (Family Medicine) [name], MD as Consulting Physician (Cardiothoracic Surgery)  Date of Service:  06/13/2019  CHIEF COMPLAINT: Follow up esophageal adenocarcinoma  SUMMARY OF ONCOLOGIC HISTORY: Oncology History Overview Note  Cancer Staging Esophageal cancer [facility]) Staging form: Esophagus - Adenocarcinoma, AJCC 8th Edition - Clinical stage from 10/26/2016: Stage III (cT3, cN1, cM0) - Signed by [name], MD on 11/05/2016     Malignant neoplasm of lower third of esophagus ([place])  09/21/2016 - 09/21/2016 Hospital Admission   esophageal pain and vomiting up blood   10/09/2016 Initial Diagnosis   Esophageal cancer ([place])   10/09/2016 Procedure   EGD 1. Partially obstructing, likely malignant esophageal tumor was found in the lower third of the esophagus. Multiple biopsies.  2. Mass visible during gastric retroflexion at GE junction 3. Otherwise normal stomach 4. Normal examined duodenum    10/09/2016 Pathology Results   Esophagus, biopsy, distal esophageal tumor ([id number]) - SUSPICIOUS FOR ADENOCARCINOMA   10/12/2016 Imaging   CT CAP w Contrast IMPRESSION: Distal esophageal mass compatible with primary esophageal malignancy. There are 2 adjacent abnormal appearing subcentimeter paraesophageal lymph nodes which may represent nodal metastasis. Additionally there is a prominent nonspecific 8 mm upper abdominal lymph node.  No evidence for distant metastatic disease in the chest, abdomen or pelvis.   10/24/2016 PET scan   1. Markedly hypermetabolic distal esophageal lesion, compatible with malignancy. Adjacent small paraesophageal lymph nodes are abnormal by CT but cannot be resolved as separate structures from the hypermetabolic esophageal activity on the PET images. No hypermetabolism is demonstrated in the upper  abdominal/gastrohepatic ligament lymph node although the small size of this lymph node may be below threshold for detection on PET imaging. 2. No evidence for distant hypermetabolic metastatic disease in the neck, chest, abdomen, or pelvis.   10/26/2016 Pathology Results   Esophagogastric junction, biopsy, mass - INVASIVE ADENOCARCINOMA.   10/26/2016 Procedure   EUS showed uT3N1 disease, and biopsy confirmed adenocarcinoma    10/30/2016 - 12/07/2016 Radiation Therapy   Neoadjuvant radiation to esophageal cancer  Under the care of Dr. [name]   10/31/2016 - 12/07/2016 Neo-Adjuvant Chemotherapy   Weekly Carboplatin AUC 2 and taxol 50mg /m2 with concurrent radiation     12/14/2016 Imaging   CT AP W Contrast 12/14/16 IMPRESSION: 1. Mildly dilated short segment of proximal jejunum without bowel wall thickening or significant inflammatory changes. Findings may represent focal ileus or radiation enteritis. No evidence for obstruction. 2. Significantly improved appearance of the distal esophagus/proximal stomach with decreased wall thickening (1.1 cm from 2.3 cm), and smaller paraesophageal and gastrohepatic ligament lymph nodes. 3. No bowel obstruction.  Normal appendix.   01/18/2017 Imaging   CT CAP W Contrast 01/18/17 IMPRESSION: 1. Mild stable distal esophageal wall thickening likely due to radiation change. No findings for recurrent tumor. Small paraesophageal lymph nodes are also stable. 2. No findings for metastatic disease. 3. Stable mild/early emphysematous changes and age advanced atherosclerotic calcifications involving the thoracic and abdominal aorta and branch vessels.   04/12/2017 Imaging   CT Chest and Abdomen W Contrast 04/12/17  IMPRESSION: 1. Mild stable distal esophageal wall thickening. No findings for recurrent tumor. 2. Stable small mediastinal and left supraclavicular lymph nodes. 3. There is a tiny nodule within the medial right lower lobe measuring 3 mm. New from  previous exam. Nonspecific. Attention on follow-up  imaging advise. 4.  Aortic Atherosclerosis (ICD10-I70.0). 5. Three vessel coronary artery calcification   04/16/2017 Surgery   VIDEO BRONCHOSCOPY, Transhiatal Total Esophagectomy, Esophagogastrostomy, pyloromotomy, Feeding Jejunostomy ESOPHAGECTOMY COMPLETE,Transhiatal total esophagectomy JEJUNOSTOMY,Feeding by Dr. [name] 04/16/17   04/16/2017 Pathology Results   Diagnosis 04/16/17  1. Omentum, resection for tumor - BENIGN ADIPOSE TISSUE CONSISTENT WITH OMENTUM. - NO EVIDENCE OF MALIGNANCY. 2. Esophagus, resection, w/ GE junction - FIBROSIS WITH PATCHY CHRONIC INFLAMMATION. - NO RESIDUAL CARCINOMA IDENTIFIED. - MARGINS NOT INVOLVED. - TWELVE LYMPH NODES WITH NO METASTATIC CARCINOMA (0/12).    07/16/2017 Procedure   Upper GI Endoscopy Findings: per Dr. [name] - Food was found in the upper third of the esophagus. Removal of food was accomplished with Jabier Mutton net. - A partial esophagectomy anastomosis was found in the upper third of the esophagus (20 cm from incisors). This was characterized by severe stenosis, an intact staple line and visible sutures. The standard adult upper endoscope would not pass before dilation. A TTS dilator was passed through the scope. Dilation with an 8.5-9.5-10.5 mm and then 06-11-12 mm balloon dilator was performed to 12 mm (inspection after 9.5 mm, 10.5 mm, 11 mm and 12 mm). The dilation site was examined and showed moderate and significant improvement in luminal narrowing. Estimated blood loss was minimal. After dilation to 12 mm the endoscope was able to transverse the anastomosis with minimal pressure. - A medium amount of food (residue) was found in the gastric body. - The exam of the stomach was otherwise normal. - The examined duodenum was normal.   08/13/2017 Procedure   Upper GI Endoscopy Findings: per Dr. [name] - Food was found in the proximal esophagus. Removal of food was accomplished with Jabier Mutton  net. - One severe (stenosis; an endoscope cannot pass) benign-appearing, intrinsic stenosis was found 20 cm from the incisors. This measured 1 cm (in length) and was traversed after dilation. A TTS dilator was passed through the scope. Dilation with an 06-11-12 mm balloon dilator was performed to 13 mm (after 3mm and 12 mm). The dilation site was examined and showed moderate improvement in luminal narrowing. At the stricture there is a visible staple on the proximal side and visible suture material on the gastric side. - A medium amount of food (residue) -the duodenum was normal    08/31/2017 Procedure   Endoscopic needle-knife excision of anastomotic stricture by Dr. Lysle Rubens at [facility], [place]   09/11/2017 Imaging   CT CAP W CONTRAST IMPRESSION: 1. Interval esophagectomy with gastric pull-through. No demonstrated complication. There is retained ingested material within the intrathoracic portion of the stomach. 2. No evidence of local recurrence or metastatic disease. 3. Stable small left supraclavicular and superior mediastinal lymph nodes. 4. Mild lower lobe paramediastinal pulmonary opacity bilaterally, likely atelectasis or sequela of prior radiation therapy. 5.  Aortic Atherosclerosis (ICD10-I70.0).    02/28/2018 Procedure   02/28/2018 Upper endoscopy Impression - Benign-appearing esophageal stenosis. Dilated. - A previous surgical anastomosis was found in the proximal stomach. - Normal examined duodenum. - No specimens collected.   04/10/2018 Imaging   04/10/2018 CT CA IMPRESSION: 1. No evidence of metastatic disease. 2. Aortic atherosclerosis (ICD10-170.0). Coronary artery calcification. 3.  Emphysema (ICD10-J43.9).   03/20/2019 Imaging   CT CAP W contrast 03/20/19  IMPRESSION: 1. Stable exam. No new or progressive interval findings to suggest recurrent/metastatic disease. 2.  Aortic Atherosclerois (ICD10-170.0)   03/30/2019 Procedure   EGD with Dr Fuller Plan 03/30/19  IMPRESSION - Food impaction  in the proximal esophagus. Removal was successful. -  Benign-appearing esophageal stenosis at 20 cm. - Ectopic gastric mucosa in the proximal esophagus. - Prior esophagectomy, gastric pull up. Sutures noted in gastric body. - A medium amount of food (residue) in the stomach that obscured visualization. - Normal duodenal bulb and second portion of the duodenum.   05/26/2019 Procedure   Upper Endoscopy by Dr. [name] 05/26/19  IMPRESSION - Benign-appearing esophageal stenosis at anastomosis. Dilated to 19 mm with balloon. Injected with steroid. 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 - Normal examined duodenum. - No specimens collected.      CURRENT THERAPY:  Surveillance  INTERVAL HISTORY:  Darrell Cunningham is here for a follow up of esophogeal cancer.  He is doing well overall, reported a few episodes of dizziness and feeling of passing out in the past few months, resolved spontaneously, no chest pain, sweating, or nausea during the episode.  He felt it was possible hypoglycemia episodes.  He was seen in the ED on March 29, 2019, for food impaction in esophagus, and required EGD.  He eats everything, sometime he has to regurgitate some food out, he had esophageal dilatation a few months ago, and swallowing better after procedure.  He has lost some weight, possibly related to eating less. No other complains.  Review of system otherwise negative   MEDICAL HISTORY:  Past Medical History:  Diagnosis Date  . Allergy   . Arthritis   . Asthma    as a child  . Cancer ([place]) 10/09/2016   ESOPHAGUS CARCINOMA   . COPD (chronic obstructive pulmonary disease) ([place])   . CP (cerebral palsy), spastic ([place])    right  . Dysphagia   . Emphysema of lung ([place])   . Encounter for nonprocreative genetic counseling 10/31/2016   Mr. Hataway underwent genetic counseling for hereditary cancer syndromes on 10/31/2016. Though he is a candidate for genetic testing, he declines at this time.   Marland Kitchen GERD (gastroesophageal reflux disease)   . Hypertension   . Neuromuscular disorder ([place])   . Pneumonia 4 yrs ago    SURGICAL HISTORY: Past Surgical History:  Procedure Laterality Date  . COLONOSCOPY    . COMPLETE ESOPHAGECTOMY N/A 04/16/2017   Procedure: ESOPHAGECTOMY COMPLETE,Transhiatal total esophagectomy;  Surgeon: [name], MD;  Location: [facility] OR;  Service: Thoracic;  Laterality: N/A;  . ESOPHAGOGASTRODUODENOSCOPY (EGD) WITH PROPOFOL N/A 03/30/2019   Procedure: ESOPHAGOGASTRODUODENOSCOPY (EGD) WITH PROPOFOL;  Surgeon: Ladene Artist, MD;  Location: [facility] ENDOSCOPY;  Service: Endoscopy;  Laterality: N/A;  . EUS N/A 10/26/2016   Procedure: UPPER ENDOSCOPIC ULTRASOUND (EUS) LINEAR;  Surgeon: [name], MD;  Location: [facility] ENDOSCOPY;  Service: Endoscopy;  Laterality: N/A;  . EYE SURGERY Bilateral age 47    for cross eyes  . FOREIGN BODY REMOVAL  03/30/2019   Procedure: FOREIGN BODY REMOVAL;  Surgeon: Ladene Artist, MD;  Location: [place];  Service: Endoscopy;;  . IR FLUORO GUIDE PORT INSERTION RIGHT  11/08/2016   right upper chest  . IR US GUIDE VASC ACCESS RIGHT  11/08/2016  . JEJUNOSTOMY N/A 04/16/2017   Procedure: Donney Rankins;  Surgeon: [name], MD;  Location: [facility] OR;  Service: Thoracic;  Laterality: N/A;  . JEJUNOSTOMY     removal of feeding tube /pt unsure of date  . MOUTH SURGERY    . UPPER GASTROINTESTINAL ENDOSCOPY    . VIDEO BRONCHOSCOPY N/A 04/16/2017   Procedure: VIDEO BRONCHOSCOPY, Transhiatal Total Esophagectomy, Esophagogastrostomy, pyloromotomy, Feeding Jejunostomy;  Surgeon: [name], MD;  Location: [facility]  OR;  Service: Thoracic;  Laterality: N/A;    I have reviewed the social history and family history with the patient and they are unchanged from previous note.  ALLERGIES:  is allergic to penicillins.  MEDICATIONS:  Current Outpatient Medications  Medication Sig Dispense Refill  . acetaminophen (TYLENOL) 160 MG/5ML  solution Take 10.2 mLs (325 mg total) by mouth every 4 (four) hours as needed for moderate pain. 120 mL 0  . albuterol (ACCUNEB) 0.63 MG/3ML nebulizer solution Take 1 ampule by nebulization every 6 (six) hours as needed for wheezing.    . cyclobenzaprine (FLEXERIL) 5 MG tablet Take 1 tablet (5 mg total) by mouth 3 (three) times daily as needed for muscle spasms. 30 tablet 0  . fluticasone (FLONASE) 50 MCG/ACT nasal spray Place 1 spray into both nostrils daily as needed for allergies or rhinitis.    . fluticasone (FLOVENT DISKUS) 50 MCG/BLIST diskus inhaler Inhale into the lungs.    . pantoprazole (PROTONIX) 40 MG tablet TAKE 1 TABLET BY MOUTH TWICE A DAY 60 tablet 0   No current facility-administered medications for this visit.     PHYSICAL EXAMINATION: ECOG PERFORMANCE STATUS: 0 - Asymptomatic  Vitals:   06/13/19 1354  BP: 125/80  Pulse: 88  Resp: 18  Temp: 98.7 F (37.1 C)  SpO2: 100%   Filed Weights   06/13/19 1354  Weight: 129 lb (58.5 kg)    GENERAL:alert, no distress and comfortable SKIN: skin color, texture, turgor are normal, no rashes or significant lesions EYES: normal, Conjunctiva are pink and non-injected, sclera clear  NECK: supple, thyroid normal size, non-tender, without nodularity LYMPH:  no palpable lymphadenopathy in the cervical, axillary  LUNGS: clear to auscultation and percussion with normal breathing effort HEART: regular rate & rhythm and no murmurs and no lower extremity edema ABDOMEN:abdomen soft, non-tender and normal bowel sounds Musculoskeletal:no cyanosis of digits and no clubbing  NEURO: alert & oriented x 3 with fluent speech, no focal motor/sensory deficits  LABORATORY DATA:  I have reviewed the data as listed CBC Latest Ref Rng & Units 06/13/2019 03/30/2019 03/20/2019  WBC 4.0 - 10.5 K/uL 8.4 7.0 6.7  Hemoglobin 13.0 - 17.0 g/dL 14.9 14.5 13.8  Hematocrit 39.0 - 52.0 % 44.2 43.1 42.0  Platelets 150 - 400 K/uL 183 199 173     CMP Latest  Ref Rng & Units 06/13/2019 03/30/2019 03/20/2019  Glucose 70 - 99 mg/dL 98 124(H) 108(H)  BUN 6 - 20 mg/dL 13 17 13   Creatinine 0.61 - 1.24 mg/dL 0.78 0.72 0.77  Sodium 135 - 145 mmol/L 139 137 139  Potassium 3.5 - 5.1 mmol/L 4.3 3.3(L) 4.8  Chloride 98 - 111 mmol/L 104 104 106  CO2 22 - 32 mmol/L 25 22 26   Calcium 8.9 - 10.3 mg/dL 9.5 9.0 8.9  Total Protein 6.5 - 8.1 g/dL 7.8 7.1 7.2  Total Bilirubin 0.3 - 1.2 mg/dL 0.5 0.5 0.3  Alkaline Phos 38 - 126 U/L 84 74 73  AST 15 - 41 U/L 17 18 18   ALT 0 - 44 U/L 15 17 18       RADIOGRAPHIC STUDIES: I have personally reviewed the radiological images as listed and agreed with the findings in the report. No results found.   ASSESSMENT & PLAN:  Darrell Cunningham is a 58 y.o. male with   1. Low esophageal Adenocarcinoma, cT3N1M0, stage III, ypT0N0  -He was diagnosed in 09/2016. He is s/p neoadjuvant ChemoRT and esophagectomy.  -his risk of recurrence is  low.I recommended cancer surveillance for 5 years. -He has had multiple EGD for dilatation. His last EGD in 03/2019 for obstruction, no evidence of local recurrence on EGD.  -He is clinically doing well, exam was unremarkable, no clinical concern for recurrence -He has lost some weight lately, I encouraged him to increase nutrition supplement -He will see Dr. Pia Mau in 3 months.  Follow-up in 9 months with surveillance CT scan a few days before   2. History of heavy smoking -He has quit smoking completely, history of 40 pack year.  3. Malnutrition and weight loss  -His nutrician and eating has much improved. He has had multiple esophogeal stretch and manages eating well  -I encouraged him to continue nutritional supplement  4. Esophageal stricture  -Secondary to previous surgery and radiation -Follow-up with GI Dr. [name] for dilatation as needed  -He had esophogeal obstruction due to food impaction in 03/2019. He underwent EGD and repeat upper endoscopy.    Plan: -He is  clinically doing well, no concern for recurrence -He will follow-up with Dr. Pia Mau in 3 months, I plan to see him back in 9 months with lab, and CT chest, abdomen pelvis with contrast a few days before.   No problem-specific Assessment & Plan notes found for this encounter.   No orders of the defined types were placed in this encounter.  All questions were answered. The patient knows to call the clinic with any problems, questions or concerns. No barriers to learning was detected. I spent 20 minutes counseling the patient face to face. The total time spent in the appointment was 25 minutes and more than 50% was on counseling and review of test results     [name], MD 06/13/2019   I, Joslyn Devon, am acting as scribe for [name], MD.   I have reviewed the above documentation for accuracy and completeness, and I agree with the above.

## 2019-06-13 ENCOUNTER — Other Ambulatory Visit: Payer: Self-pay

## 2019-06-13 ENCOUNTER — Other Ambulatory Visit: Payer: Self-pay | Admitting: Family Medicine

## 2019-06-13 ENCOUNTER — Encounter: Payer: Self-pay | Admitting: Hematology

## 2019-06-13 ENCOUNTER — Inpatient Hospital Stay (HOSPITAL_BASED_OUTPATIENT_CLINIC_OR_DEPARTMENT_OTHER): Payer: Medicaid Other | Admitting: Hematology

## 2019-06-13 ENCOUNTER — Inpatient Hospital Stay: Payer: Medicaid Other | Attending: Hematology

## 2019-06-13 VITALS — BP 125/80 | HR 88 | Temp 98.7°F | Resp 18 | Ht 61.0 in | Wt 129.0 lb

## 2019-06-13 DIAGNOSIS — R911 Solitary pulmonary nodule: Secondary | ICD-10-CM | POA: Diagnosis not present

## 2019-06-13 DIAGNOSIS — I2510 Atherosclerotic heart disease of native coronary artery without angina pectoris: Secondary | ICD-10-CM | POA: Diagnosis not present

## 2019-06-13 DIAGNOSIS — M1990 Unspecified osteoarthritis, unspecified site: Secondary | ICD-10-CM | POA: Insufficient documentation

## 2019-06-13 DIAGNOSIS — Z79899 Other long term (current) drug therapy: Secondary | ICD-10-CM | POA: Diagnosis not present

## 2019-06-13 DIAGNOSIS — K219 Gastro-esophageal reflux disease without esophagitis: Secondary | ICD-10-CM

## 2019-06-13 DIAGNOSIS — C155 Malignant neoplasm of lower third of esophagus: Secondary | ICD-10-CM

## 2019-06-13 DIAGNOSIS — Z9221 Personal history of antineoplastic chemotherapy: Secondary | ICD-10-CM | POA: Insufficient documentation

## 2019-06-13 DIAGNOSIS — K31819 Angiodysplasia of stomach and duodenum without bleeding: Secondary | ICD-10-CM | POA: Diagnosis not present

## 2019-06-13 DIAGNOSIS — I700 Atherosclerosis of aorta: Secondary | ICD-10-CM | POA: Insufficient documentation

## 2019-06-13 DIAGNOSIS — Z95828 Presence of other vascular implants and grafts: Secondary | ICD-10-CM

## 2019-06-13 DIAGNOSIS — T18128A Food in esophagus causing other injury, initial encounter: Secondary | ICD-10-CM | POA: Insufficient documentation

## 2019-06-13 DIAGNOSIS — K222 Esophageal obstruction: Secondary | ICD-10-CM | POA: Diagnosis not present

## 2019-06-13 DIAGNOSIS — Z880 Allergy status to penicillin: Secondary | ICD-10-CM | POA: Diagnosis not present

## 2019-06-13 DIAGNOSIS — Z9049 Acquired absence of other specified parts of digestive tract: Secondary | ICD-10-CM | POA: Diagnosis not present

## 2019-06-13 DIAGNOSIS — Z923 Personal history of irradiation: Secondary | ICD-10-CM | POA: Insufficient documentation

## 2019-06-13 DIAGNOSIS — E46 Unspecified protein-calorie malnutrition: Secondary | ICD-10-CM | POA: Insufficient documentation

## 2019-06-13 DIAGNOSIS — Z87891 Personal history of nicotine dependence: Secondary | ICD-10-CM | POA: Diagnosis not present

## 2019-06-13 LAB — COMPREHENSIVE METABOLIC PANEL
ALT: 15 U/L (ref 0–44)
AST: 17 U/L (ref 15–41)
Albumin: 3.9 g/dL (ref 3.5–5.0)
Alkaline Phosphatase: 84 U/L (ref 38–126)
Anion gap: 10 (ref 5–15)
BUN: 13 mg/dL (ref 6–20)
CO2: 25 mmol/L (ref 22–32)
Calcium: 9.5 mg/dL (ref 8.9–10.3)
Chloride: 104 mmol/L (ref 98–111)
Creatinine, Ser: 0.78 mg/dL (ref 0.61–1.24)
GFR calc Af Amer: >60 mL/min (ref >60)
GFR calc non Af Amer: >60 mL/min (ref >60)
Glucose, Bld: 98 mg/dL (ref 70–99)
Potassium: 4.3 mmol/L (ref 3.5–5.1)
Sodium: 139 mmol/L (ref 135–145)
Total Bilirubin: 0.5 mg/dL (ref 0.3–1.2)
Total Protein: 7.8 g/dL (ref 6.5–8.1)

## 2019-06-13 LAB — CBC WITH DIFFERENTIAL/PLATELET
Abs Immature Granulocytes: 0.01 10*3/uL (ref 0.00–0.07)
Basophils Absolute: 0 10*3/uL (ref 0.0–0.1)
Basophils Relative: 0 %
Eosinophils Absolute: 0.1 10*3/uL (ref 0.0–0.5)
Eosinophils Relative: 1 %
HCT: 44.2 % (ref 39.0–52.0)
Hemoglobin: 14.9 g/dL (ref 13.0–17.0)
Immature Granulocytes: 0 %
Lymphocytes Relative: 17 %
Lymphs Abs: 1.4 10*3/uL (ref 0.7–4.0)
MCH: 30.5 pg (ref 26.0–34.0)
MCHC: 33.7 g/dL (ref 30.0–36.0)
MCV: 90.6 fL (ref 80.0–100.0)
Monocytes Absolute: 0.6 10*3/uL (ref 0.1–1.0)
Monocytes Relative: 7 %
Neutro Abs: 6.3 10*3/uL (ref 1.7–7.7)
Neutrophils Relative %: 75 %
Platelets: 183 10*3/uL (ref 150–400)
RBC: 4.88 MIL/uL (ref 4.22–5.81)
RDW: 12.7 % (ref 11.5–15.5)
WBC: 8.4 10*3/uL (ref 4.0–10.5)
nRBC: 0 % (ref 0.0–0.2)

## 2019-06-13 LAB — CEA (IN HOUSE-CHCC): CEA (CHCC-In House): 1.18 ng/mL (ref 0.00–5.00)

## 2019-06-13 MED ORDER — HEPARIN SOD (PORK) LOCK FLUSH 100 UNIT/ML IV SOLN
500.0000 [IU] | Freq: Once | INTRAVENOUS | Status: AC
  Administered 2019-06-13: 500 [IU]
  Filled 2019-06-13: qty 5

## 2019-06-13 MED ORDER — SODIUM CHLORIDE 0.9% FLUSH
10.0000 mL | Freq: Once | INTRAVENOUS | Status: AC
  Administered 2019-06-13: 14:00:00 10 mL
  Filled 2019-06-13: qty 10

## 2019-06-16 ENCOUNTER — Telehealth: Payer: Self-pay

## 2019-06-16 ENCOUNTER — Telehealth: Payer: Self-pay | Admitting: Hematology

## 2019-06-16 NOTE — Telephone Encounter (Signed)
-----   Message from [name], MD sent at 06/16/2019  7:58 AM EST ----- Let pt know his tumor marker and CMP were normal, no concerns, thanks  [name]  06/16/2019

## 2019-06-16 NOTE — Telephone Encounter (Signed)
Spoke with patient regarding lab results.  Per Dr. Burr Medico tumor marker and CMP were all normal, no concerns.  Also he wants to keep his port for a while yet.  A scheduling message was sent for flush appointments every 2 months.

## 2019-06-16 NOTE — Telephone Encounter (Signed)
Added port flush appointments per 11/16 schedule message. Spoke with patient re next appointment 08/08/2019. Per patient he will confirm remaining appointments via mychart.

## 2019-06-16 NOTE — Telephone Encounter (Signed)
Scheduled appt per 11/13 los.  Printed and mailed appt calendar,

## 2019-08-05 ENCOUNTER — Telehealth: Payer: Self-pay | Admitting: Hematology

## 2019-08-05 NOTE — Telephone Encounter (Signed)
Returned patient's phone call regarding rescheduling 01/08 appointment, per patient's request appointment has moved to 01/07.

## 2019-08-07 ENCOUNTER — Inpatient Hospital Stay: Payer: Medicaid Other | Attending: Hematology

## 2019-08-07 ENCOUNTER — Other Ambulatory Visit: Payer: Self-pay

## 2019-08-07 DIAGNOSIS — Z923 Personal history of irradiation: Secondary | ICD-10-CM | POA: Insufficient documentation

## 2019-08-07 DIAGNOSIS — Z79899 Other long term (current) drug therapy: Secondary | ICD-10-CM | POA: Insufficient documentation

## 2019-08-07 DIAGNOSIS — Z87891 Personal history of nicotine dependence: Secondary | ICD-10-CM | POA: Diagnosis not present

## 2019-08-07 DIAGNOSIS — Z452 Encounter for adjustment and management of vascular access device: Secondary | ICD-10-CM | POA: Insufficient documentation

## 2019-08-07 DIAGNOSIS — C155 Malignant neoplasm of lower third of esophagus: Secondary | ICD-10-CM | POA: Insufficient documentation

## 2019-08-07 DIAGNOSIS — K222 Esophageal obstruction: Secondary | ICD-10-CM | POA: Insufficient documentation

## 2019-08-07 DIAGNOSIS — Z95828 Presence of other vascular implants and grafts: Secondary | ICD-10-CM

## 2019-08-07 DIAGNOSIS — E46 Unspecified protein-calorie malnutrition: Secondary | ICD-10-CM | POA: Insufficient documentation

## 2019-08-07 MED ORDER — HEPARIN SOD (PORK) LOCK FLUSH 100 UNIT/ML IV SOLN
500.0000 [IU] | Freq: Once | INTRAVENOUS | Status: AC
  Administered 2019-08-07: 14:00:00 500 [IU]
  Filled 2019-08-07: qty 5

## 2019-08-07 MED ORDER — SODIUM CHLORIDE 0.9% FLUSH
10.0000 mL | Freq: Once | INTRAVENOUS | Status: AC
  Administered 2019-08-07: 14:00:00 10 mL
  Filled 2019-08-07: qty 10

## 2019-08-08 ENCOUNTER — Inpatient Hospital Stay: Payer: Medicaid Other

## 2019-08-14 ENCOUNTER — Telehealth: Payer: Self-pay | Admitting: Family Medicine

## 2019-08-14 ENCOUNTER — Other Ambulatory Visit: Payer: Self-pay | Admitting: Family Medicine

## 2019-08-14 DIAGNOSIS — K222 Esophageal obstruction: Secondary | ICD-10-CM

## 2019-08-14 DIAGNOSIS — K219 Gastro-esophageal reflux disease without esophagitis: Secondary | ICD-10-CM

## 2019-08-14 MED ORDER — PANTOPRAZOLE SODIUM 40 MG PO TBEC: 40.0000 mg | DELAYED_RELEASE_TABLET | Freq: Two times a day (BID) | ORAL | 3 refills | Status: DC

## 2019-08-14 NOTE — Telephone Encounter (Signed)
[facility] patient needs refill on pantoprazole

## 2019-08-14 NOTE — Telephone Encounter (Signed)
Medication called/sent to requested pharmacy  

## 2019-09-25 ENCOUNTER — Other Ambulatory Visit: Payer: Self-pay

## 2019-09-25 ENCOUNTER — Ambulatory Visit: Payer: Medicaid Other | Admitting: Cardiothoracic Surgery

## 2019-09-25 VITALS — BP 128/76 | HR 82 | Temp 97.7°F | Resp 20 | Ht 61.0 in | Wt 133.0 lb

## 2019-09-25 DIAGNOSIS — C159 Malignant neoplasm of esophagus, unspecified: Secondary | ICD-10-CM

## 2019-09-25 NOTE — Progress Notes (Signed)
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 PROCEDURES PERFORMED:  Bronchoscopy, transhiatal total esophagectomy with cervical esophagogastrostomy, pyloromyotomy, and feeding jejunostomy tube. SURGEON:  [name], MD.  Cancer Staging Malignant neoplasm of lower third of esophagus [facility] Staging form: Esophagus - Adenocarcinoma, AJCC 8th Edition - Clinical stage from 10/26/2016: Stage III (cT3, cN1, cM0) - Signed by [name], MD on 11/05/2016 - Pathologic stage from 04/20/2017: Stage I (ypT0, pN0, cM0, GX) - Signed by [name], MD on 04/23/2017   History of Present Illness:      Patient returns to the office today for postop check after  Esophagectomy for a stage III adenocarcinoma of the GE junction , he received radiation chemotherapy preoperatively and surgical resection September 2018, pathologically staged as stage I.  The patient has required dilatation of his cervical esophagus early postoperatively, most recently dilated in the fall 2020.  Since last dilatation has been able to take food relatively well, he does have reflux symptoms if he eats a big meal and lays down.  Sleeps with his head of the bed elevated.    Zubrod Score: At the time of surgery this patient's most appropriate activity status/level should be described as: [x]     0    Normal activity, no symptoms []     1    Restricted in physical strenuous activity but ambulatory, able to do out light work []     2    Ambulatory and capable  of self care, unable to do work activities, up and about                 >50 % of waking hours                                                                                   []     3    Only limited self care, in bed greater than 50% of waking hours []     4    Completely disabled, no self care, confined to bed or chair []     5    Moribund  Social History   Tobacco Use  Smoking Status Former Smoker  . Packs/day: 1.00  . Years: 38.00  . Pack years: 38.00  . Types: Cigarettes  . Start date: 10/31/2012  . Quit date: 10/31/2012  . Years since quitting: 6.9  Smokeless Tobacco Never Used       Allergies  Allergen Reactions  . Penicillins Hives, Itching and Swelling    Has patient had a PCN reaction causing immediate rash, facial/tongue/throat swelling, SOB or lightheadedness with hypotension: No Has patient  had a PCN reaction causing severe rash involving mucus membranes or skin necrosis: No Has patient had a PCN reaction that required hospitalization No Has patient had a PCN reaction occurring within the last 10 years: No If all of the above answers are "NO", then may proceed with Cephalosporin use. Unknown Childhood reaction    Current Outpatient Medications  Medication Sig Dispense Refill  . acetaminophen (TYLENOL) 160 MG/5ML solution Take 10.2 mLs (325 mg total) by mouth every 4 (four) hours as needed for moderate pain. 120 mL 0  . albuterol (ACCUNEB) 0.63 MG/3ML nebulizer solution Take 1 ampule by nebulization every 6 (six) hours as needed for wheezing.    . cyclobenzaprine (FLEXERIL) 5 MG tablet Take 1 tablet (5 mg total) by mouth 3 (three) times daily as needed for muscle spasms. 30 tablet 0  . fluticasone (FLONASE) 50 MCG/ACT nasal spray Place 1 spray into both nostrils daily as needed for allergies or rhinitis.    . fluticasone (FLOVENT DISKUS) 50 MCG/BLIST diskus inhaler Inhale into the lungs.    . pantoprazole (PROTONIX) 40 MG tablet Take 1 tablet (40 mg total) by mouth 2  (two) times daily. 60 tablet 3   No current facility-administered medications for this visit.    Physical Exam: BP 128/76   Pulse 82   Temp 97.7 F (36.5 C) (Skin)   Resp 20   Ht 5\' 1"  (1.549 m)   Wt 133 lb (60.3 kg)   SpO2 98% Comment: RA  BMI 25.13 kg/m   General appearance: alert, cooperative and no distress Neck: no adenopathy, no carotid bruit, no JVD, supple, symmetrical, trachea midline and thyroid not enlarged, symmetric, no tenderness/mass/nodules Lymph nodes: Cervical, supraclavicular, and axillary nodes normal. Resp: clear to auscultation bilaterally Cardio: regular rate and rhythm, S1, S2 normal, no murmur, click, rub or gallop GI: soft, non-tender; bowel sounds normal; no masses,  no organomegaly Extremities: extremities normal, atraumatic, no cyanosis or edema and Homans sign is negative, no sign of DVT Neurologic: Grossly normal Abdominal and left neck incisions well-healed.   Diagnostic Studies & Laboratory data:         Recent Radiology Findings:    On: 03/20/2019 13:45   Ct chest Abdomen Pelvis W Contrast  Result Date: 03/20/2019 CLINICAL DATA:  Esophageal cancer. EXAM: CT CHEST, ABDOMEN, AND PELVIS WITH CONTRAST TECHNIQUE: Multidetector CT imaging of the chest, abdomen and pelvis was performed following the standard protocol during bolus administration of intravenous contrast. CONTRAST:  165mL OMNIPAQUE IOHEXOL 300 MG/ML  SOLN COMPARISON:  04/10/2018 FINDINGS: CT CHEST FINDINGS Cardiovascular: The heart size is normal. Similar appearance trace pericardial effusion. Coronary artery calcification is evident. Right Port-A-Cath tip is positioned in the mid SVC. Atherosclerotic calcification is noted in the wall of the thoracic aorta. Mediastinum/Nodes: Mediastinum status post gastric pull-through procedure. No mediastinal lymphadenopathy. There is no hilar lymphadenopathy. There is no axillary lymphadenopathy. Lungs/Pleura: 2 mm peripheral right upper lobe pulmonary  nodule is stable in the interval, consistent with benign etiology. No new suspicious pulmonary nodule or mass. No pleural effusion. No focal airspace consolidation. Musculoskeletal: No worrisome lytic or sclerotic osseous abnormality. CT ABDOMEN PELVIS FINDINGS Hepatobiliary: No suspicious focal abnormality within the liver parenchyma. There is no evidence for gallstones, gallbladder wall thickening, or pericholecystic fluid. No intrahepatic or extrahepatic biliary dilation. Pancreas: No focal mass lesion. No dilatation of the main duct. No intraparenchymal cyst. No peripancreatic edema. Spleen: No splenomegaly. No focal mass lesion. Adrenals/Urinary Tract: No adrenal nodule or mass. Kidneys unremarkable. No  evidence for hydroureter. The urinary bladder appears normal for the degree of distention. Stomach/Bowel: Status post gastric pull-through procedure. Duodenum is normally positioned as is the ligament of Treitz. No small bowel wall thickening. No small bowel dilatation. The terminal ileum is normal. The appendix is normal. No gross colonic mass. No colonic wall thickening. Vascular/Lymphatic: There is abdominal aortic atherosclerosis without aneurysm. There is no gastrohepatic or hepatoduodenal ligament lymphadenopathy. No intraperitoneal or retroperitoneal lymphadenopathy. No pelvic sidewall lymphadenopathy. Reproductive: The prostate gland and seminal vesicles are unremarkable. Other: No intraperitoneal free fluid. Musculoskeletal: No worrisome lytic or sclerotic osseous abnormality. l IMPRESSION: 1. Stable exam. No new or progressive interval findings to suggest recurrent/metastatic disease. 2.  Aortic Atherosclerois (ICD10-170.0) Electronically Signed   By: Misty Stanley M.D.   On: 03/20/2019 13:45   I have independently reviewed the above radiology studies  and reviewed the findings with the patient.   CLINICAL DATA:  Esophageal cancer, chemotherapy and radiation therapy complete.  EXAM: CT CHEST  and abdomen WITH CONTRAST  TECHNIQUE: Multidetector CT imaging of the chest and abdomen was performed following the standard protocol during bolus administration of intravenous contrast.  CONTRAST:  134mL OMNIPAQUE IOHEXOL 300 MG/ML  SOLN  COMPARISON:  CT chest abdomen 09/11/2017.  FINDINGS: CT CHEST FINDINGS  Cardiovascular: Right IJ Port-A-Cath terminates in the right atrium. Atherosclerotic calcification of the arterial vasculature, including coronary arteries. Heart size normal. Small pericardial effusion is stable.  Mediastinum/Nodes: Mediastinal lymph nodes are not enlarged by CT size criteria and are stable. No hilar or axillary adenopathy. Esophagectomy and gastric pull-through.  Lungs/Pleura: Mild paraseptal emphysema. Irregular consolidation in the medial aspect of the right lower lobe (series 7, image 105), unchanged. Lungs are otherwise clear. No pleural fluid. Airway is unremarkable.  Musculoskeletal: No worrisome lytic or sclerotic lesions.  CT ABDOMEN FINDINGS  Hepatobiliary: Liver and gallbladder are unremarkable. No biliary ductal dilatation.  Pancreas: Negative.  Spleen: Negative.  Adrenals/Urinary Tract: Adrenal glands and kidneys are unremarkable.  Stomach/Bowel: Gastric pull-through. Visualized portions of the small bowel, appendix and colon are unremarkable.  Vascular/Lymphatic: Atherosclerotic calcification of the arterial vasculature without abdominal aortic aneurysm. Lymph node at the diaphragmatic hiatus measures 8 mm (series 2, image 51), unchanged.  Other: No free fluid.  Mesenteries and peritoneum are unremarkable.  Musculoskeletal: No worrisome lytic or sclerotic lesions.  IMPRESSION: 1. No evidence of metastatic disease. 2. Aortic atherosclerosis (ICD10-170.0). Coronary artery calcification. 3.  Emphysema (ICD10-J43.9).   Electronically Signed   By: Lorin Picket M.D.   On: 04/10/2018  15:07    Recent Labs: Lab Results  Component Value Date   WBC 8.4 06/13/2019   HGB 14.9 06/13/2019   HCT 44.2 06/13/2019   PLT 183 06/13/2019   GLUCOSE 98 06/13/2019   CHOL 157 10/27/2014   TRIG 83 10/27/2014   HDL 34 (L) 10/27/2014   LDLCALC 106 (H) 10/27/2014   ALT 15 06/13/2019   AST 17 06/13/2019   NA 139 06/13/2019   K 4.3 06/13/2019   CL 104 06/13/2019   CREATININE 0.78 06/13/2019   BUN 13 06/13/2019   CO2 25 06/13/2019   INR 1.0 03/30/2019   HGBA1C 5.7 (H) 10/27/2014   Wt Readings from Last 3 Encounters:  09/25/19 133 lb (60.3 kg)  06/13/19 129 lb (58.5 kg)  05/26/19 133 lb (60.3 kg)      Assessment / Plan:   Patient stable after transhiatal total esophagectomyCancer Staging Malignant neoplasm of lower third of esophagus ([place]) Staging form: Esophagus - Adenocarcinoma,  AJCC 8th Edition - Clinical stage from 10/26/2016: Stage III (cT3, cN1, cM0) - Signed by [name], MD on 11/05/2016 - Pathologic stage from 04/20/2017: Stage I (ypT0, pN0, cM0, GX) - Signed by [name], MD on 04/23/2017  Patient now 2 1/2  years after esophagectomy, stable without evidence of recurrence. Continue careful detail to his diet and eating habits to avoid symptoms. Plan to see him back in 6 months      [name] 09/25/2019 10:51 AM

## 2019-10-03 ENCOUNTER — Inpatient Hospital Stay: Payer: Medicaid Other | Attending: Hematology

## 2019-10-03 ENCOUNTER — Other Ambulatory Visit: Payer: Self-pay

## 2019-10-03 DIAGNOSIS — Z923 Personal history of irradiation: Secondary | ICD-10-CM | POA: Insufficient documentation

## 2019-10-03 DIAGNOSIS — Z9221 Personal history of antineoplastic chemotherapy: Secondary | ICD-10-CM | POA: Diagnosis not present

## 2019-10-03 DIAGNOSIS — K222 Esophageal obstruction: Secondary | ICD-10-CM | POA: Diagnosis not present

## 2019-10-03 DIAGNOSIS — E46 Unspecified protein-calorie malnutrition: Secondary | ICD-10-CM | POA: Diagnosis not present

## 2019-10-03 DIAGNOSIS — R634 Abnormal weight loss: Secondary | ICD-10-CM | POA: Diagnosis not present

## 2019-10-03 DIAGNOSIS — C155 Malignant neoplasm of lower third of esophagus: Secondary | ICD-10-CM | POA: Insufficient documentation

## 2019-10-03 DIAGNOSIS — Z87891 Personal history of nicotine dependence: Secondary | ICD-10-CM | POA: Diagnosis not present

## 2019-10-03 DIAGNOSIS — Z79899 Other long term (current) drug therapy: Secondary | ICD-10-CM | POA: Diagnosis not present

## 2019-10-03 DIAGNOSIS — Z95828 Presence of other vascular implants and grafts: Secondary | ICD-10-CM

## 2019-10-03 MED ORDER — SODIUM CHLORIDE 0.9% FLUSH
10.0000 mL | Freq: Once | INTRAVENOUS | Status: AC
  Administered 2019-10-03: 10 mL
  Filled 2019-10-03: qty 10

## 2019-10-03 MED ORDER — HEPARIN SOD (PORK) LOCK FLUSH 100 UNIT/ML IV SOLN
500.0000 [IU] | Freq: Once | INTRAVENOUS | Status: AC
  Administered 2019-10-03: 13:00:00 500 [IU]
  Filled 2019-10-03: qty 5

## 2019-10-21 DIAGNOSIS — Z23 Encounter for immunization: Secondary | ICD-10-CM | POA: Diagnosis not present

## 2019-10-31 ENCOUNTER — Ambulatory Visit: Payer: Medicaid Other | Attending: Internal Medicine

## 2019-10-31 DIAGNOSIS — Z23 Encounter for immunization: Secondary | ICD-10-CM

## 2019-10-31 NOTE — Progress Notes (Signed)
   Covid-19 Vaccination Clinic  Name:  JAKORY SPEDDING    MRN: [id number] DOB: [birth year]  10/31/2019  Mr. Robello was observed post Covid-19 immunization for 15 minutes without incident. He was provided with Vaccine Information Sheet and instruction to access the V-Safe system.   Mr. Hartsock was instructed to call 911 with any severe reactions post vaccine: Marland Kitchen Difficulty breathing  . Swelling of face and throat  . A fast heartbeat  . A bad rash all over body  . Dizziness and weakness   Immunizations Administered    Name Date Dose VIS Date Route   Moderna COVID-19 Vaccine 10/31/2019 10:54 AM 0.5 mL 07/01/2019 Intramuscular   Manufacturer: Moderna   Lot: [id number]   [id number]

## 2019-11-28 ENCOUNTER — Other Ambulatory Visit: Payer: Self-pay

## 2019-11-28 ENCOUNTER — Inpatient Hospital Stay: Payer: Medicaid Other | Attending: Hematology

## 2019-11-28 DIAGNOSIS — Z79899 Other long term (current) drug therapy: Secondary | ICD-10-CM | POA: Diagnosis not present

## 2019-11-28 DIAGNOSIS — Z87891 Personal history of nicotine dependence: Secondary | ICD-10-CM | POA: Diagnosis not present

## 2019-11-28 DIAGNOSIS — E46 Unspecified protein-calorie malnutrition: Secondary | ICD-10-CM | POA: Insufficient documentation

## 2019-11-28 DIAGNOSIS — C155 Malignant neoplasm of lower third of esophagus: Secondary | ICD-10-CM | POA: Diagnosis not present

## 2019-11-28 DIAGNOSIS — R634 Abnormal weight loss: Secondary | ICD-10-CM | POA: Insufficient documentation

## 2019-11-28 DIAGNOSIS — Z95828 Presence of other vascular implants and grafts: Secondary | ICD-10-CM

## 2019-11-28 MED ORDER — HEPARIN SOD (PORK) LOCK FLUSH 100 UNIT/ML IV SOLN
500.0000 [IU] | Freq: Once | INTRAVENOUS | Status: AC
  Administered 2019-11-28: 13:00:00 500 [IU]
  Filled 2019-11-28: qty 5

## 2019-11-28 MED ORDER — SODIUM CHLORIDE 0.9% FLUSH
10.0000 mL | Freq: Once | INTRAVENOUS | Status: AC
  Administered 2019-11-28: 10 mL
  Filled 2019-11-28: qty 10

## 2019-12-02 ENCOUNTER — Ambulatory Visit: Payer: Medicaid Other | Attending: Internal Medicine

## 2019-12-02 DIAGNOSIS — Z23 Encounter for immunization: Secondary | ICD-10-CM

## 2019-12-02 NOTE — Progress Notes (Signed)
   Covid-19 Vaccination Clinic  Name:  Darrell [place]    MRN: [id number] DOB: 1961-04-16  12/02/2019  [name] was observed post Covid-19 immunization for 15 minutes without incident. He was provided with Vaccine Information Sheet and instruction to access the V-Safe system.   [name] was instructed to call 911 with any severe reactions post vaccine: Marland Kitchen Difficulty breathing  . Swelling of face and throat  . A fast heartbeat  . A bad rash all over body  . Dizziness and weakness   Immunizations Administered    Name Date Dose VIS Date Route   Moderna COVID-19 Vaccine 12/02/2019 10:01 AM 0.5 mL 07/2019 Intramuscular   Manufacturer: Moderna   Lot: [id number]   [id number]

## 2020-01-01 ENCOUNTER — Encounter: Payer: Self-pay | Admitting: Internal Medicine

## 2020-01-17 DIAGNOSIS — H2513 Age-related nuclear cataract, bilateral: Secondary | ICD-10-CM | POA: Diagnosis not present

## 2020-01-17 DIAGNOSIS — H40033 Anatomical narrow angle, bilateral: Secondary | ICD-10-CM | POA: Diagnosis not present

## 2020-01-23 ENCOUNTER — Other Ambulatory Visit: Payer: Self-pay

## 2020-01-23 ENCOUNTER — Inpatient Hospital Stay: Payer: Medicaid Other | Attending: Hematology

## 2020-01-23 DIAGNOSIS — C155 Malignant neoplasm of lower third of esophagus: Secondary | ICD-10-CM | POA: Diagnosis not present

## 2020-01-23 DIAGNOSIS — Z452 Encounter for adjustment and management of vascular access device: Secondary | ICD-10-CM | POA: Diagnosis not present

## 2020-01-23 DIAGNOSIS — Z95828 Presence of other vascular implants and grafts: Secondary | ICD-10-CM

## 2020-01-23 MED ORDER — HEPARIN SOD (PORK) LOCK FLUSH 100 UNIT/ML IV SOLN
500.0000 [IU] | Freq: Once | INTRAVENOUS | Status: AC
  Administered 2020-01-23: 500 [IU]
  Filled 2020-01-23: qty 5

## 2020-01-23 MED ORDER — SODIUM CHLORIDE 0.9% FLUSH
10.0000 mL | Freq: Once | INTRAVENOUS | Status: AC
  Administered 2020-01-23: 10 mL
  Filled 2020-01-23: qty 10

## 2020-03-03 ENCOUNTER — Other Ambulatory Visit: Payer: Self-pay

## 2020-03-03 ENCOUNTER — Ambulatory Visit (AMBULATORY_SURGERY_CENTER): Payer: Self-pay | Admitting: *Deleted

## 2020-03-03 VITALS — Ht 61.0 in | Wt 130.0 lb

## 2020-03-03 DIAGNOSIS — Z8501 Personal history of malignant neoplasm of esophagus: Secondary | ICD-10-CM

## 2020-03-03 DIAGNOSIS — R1310 Dysphagia, unspecified: Secondary | ICD-10-CM

## 2020-03-03 DIAGNOSIS — R1319 Other dysphagia: Secondary | ICD-10-CM

## 2020-03-03 DIAGNOSIS — Z86010 Personal history of colonic polyps: Secondary | ICD-10-CM

## 2020-03-03 MED ORDER — SUPREP BOWEL PREP KIT 17.5-3.13-1.6 GM/177ML PO SOLN: 1.0000 | Freq: Once | ORAL | 0 refills | Status: AC

## 2020-03-03 NOTE — Progress Notes (Signed)
No egg or soy allergy known to patient  No issues with past sedation with any surgeries or procedures no intubation problems in the past  No FH of Malignant Hyperthermia No diet pills per patient No home 02 use per patient  No blood thinners per patient  Pt denies issues with constipation  No A fib or A flutter  COVID 19 guidelines implemented in PV today with Pt and RN    cov vax x 2    Due to the COVID-19 pandemic we are asking patients to follow these guidelines. Please only bring one care partner. Please be aware that your care partner may wait in the car in the parking lot or if they feel like they will be too hot to wait in the car, they may wait in the lobby on the 4th floor. All care partners are required to wear a mask the entire time (we do not have any that we can provide them), they need to practice social distancing, and we will do a Covid check for all patient's and care partners when you arrive. Also we will check their temperature and your temperature. If the care partner waits in their car they need to stay in the parking lot the entire time and we will call them on their cell phone when the patient is ready for discharge so they can bring the car to the front of the building. Also all patient's will need to wear a mask into building.

## 2020-03-09 ENCOUNTER — Other Ambulatory Visit: Payer: Self-pay

## 2020-03-09 ENCOUNTER — Ambulatory Visit (HOSPITAL_COMMUNITY)
Admission: RE | Admit: 2020-03-09 | Discharge: 2020-03-09 | Disposition: A | Discharge status: Home / Self Care | Payer: Medicaid Other | Source: Ambulatory Visit | Attending: Hematology | Admitting: Hematology

## 2020-03-09 ENCOUNTER — Inpatient Hospital Stay: Payer: Medicaid Other | Attending: Hematology

## 2020-03-09 ENCOUNTER — Inpatient Hospital Stay: Payer: Medicaid Other

## 2020-03-09 DIAGNOSIS — Z95828 Presence of other vascular implants and grafts: Secondary | ICD-10-CM

## 2020-03-09 DIAGNOSIS — Z9049 Acquired absence of other specified parts of digestive tract: Secondary | ICD-10-CM | POA: Diagnosis not present

## 2020-03-09 DIAGNOSIS — K222 Esophageal obstruction: Secondary | ICD-10-CM | POA: Insufficient documentation

## 2020-03-09 DIAGNOSIS — R05 Cough: Secondary | ICD-10-CM | POA: Insufficient documentation

## 2020-03-09 DIAGNOSIS — Z87891 Personal history of nicotine dependence: Secondary | ICD-10-CM | POA: Insufficient documentation

## 2020-03-09 DIAGNOSIS — R42 Dizziness and giddiness: Secondary | ICD-10-CM | POA: Insufficient documentation

## 2020-03-09 DIAGNOSIS — Z880 Allergy status to penicillin: Secondary | ICD-10-CM | POA: Insufficient documentation

## 2020-03-09 DIAGNOSIS — E46 Unspecified protein-calorie malnutrition: Secondary | ICD-10-CM | POA: Insufficient documentation

## 2020-03-09 DIAGNOSIS — I2510 Atherosclerotic heart disease of native coronary artery without angina pectoris: Secondary | ICD-10-CM | POA: Insufficient documentation

## 2020-03-09 DIAGNOSIS — R634 Abnormal weight loss: Secondary | ICD-10-CM | POA: Insufficient documentation

## 2020-03-09 DIAGNOSIS — I700 Atherosclerosis of aorta: Secondary | ICD-10-CM | POA: Diagnosis not present

## 2020-03-09 DIAGNOSIS — Z923 Personal history of irradiation: Secondary | ICD-10-CM | POA: Diagnosis not present

## 2020-03-09 DIAGNOSIS — C155 Malignant neoplasm of lower third of esophagus: Secondary | ICD-10-CM | POA: Diagnosis present

## 2020-03-09 DIAGNOSIS — Z79899 Other long term (current) drug therapy: Secondary | ICD-10-CM | POA: Diagnosis not present

## 2020-03-09 DIAGNOSIS — C159 Malignant neoplasm of esophagus, unspecified: Secondary | ICD-10-CM | POA: Diagnosis not present

## 2020-03-09 DIAGNOSIS — Z7951 Long term (current) use of inhaled steroids: Secondary | ICD-10-CM | POA: Insufficient documentation

## 2020-03-09 LAB — CBC WITH DIFFERENTIAL/PLATELET
Abs Immature Granulocytes: 0.01 10*3/uL (ref 0.00–0.07)
Basophils Absolute: 0 10*3/uL (ref 0.0–0.1)
Basophils Relative: 1 %
Eosinophils Absolute: 0.2 10*3/uL (ref 0.0–0.5)
Eosinophils Relative: 3 %
HCT: 41.4 % (ref 39.0–52.0)
Hemoglobin: 13.8 g/dL (ref 13.0–17.0)
Immature Granulocytes: 0 %
Lymphocytes Relative: 24 %
Lymphs Abs: 1.6 10*3/uL (ref 0.7–4.0)
MCH: 31.1 pg (ref 26.0–34.0)
MCHC: 33.3 g/dL (ref 30.0–36.0)
MCV: 93.2 fL (ref 80.0–100.0)
Monocytes Absolute: 0.7 10*3/uL (ref 0.1–1.0)
Monocytes Relative: 11 %
Neutro Abs: 4.1 10*3/uL (ref 1.7–7.7)
Neutrophils Relative %: 61 %
Platelets: 191 10*3/uL (ref 150–400)
RBC: 4.44 MIL/uL (ref 4.22–5.81)
RDW: 12.9 % (ref 11.5–15.5)
WBC: 6.6 10*3/uL (ref 4.0–10.5)
nRBC: 0 % (ref 0.0–0.2)

## 2020-03-09 LAB — COMPREHENSIVE METABOLIC PANEL
ALT: 12 U/L (ref 0–44)
AST: 14 U/L — ABNORMAL LOW (ref 15–41)
Albumin: 3.4 g/dL — ABNORMAL LOW (ref 3.5–5.0)
Alkaline Phosphatase: 78 U/L (ref 38–126)
Anion gap: 8 (ref 5–15)
BUN: 16 mg/dL (ref 6–20)
CO2: 24 mmol/L (ref 22–32)
Calcium: 9.6 mg/dL (ref 8.9–10.3)
Chloride: 106 mmol/L (ref 98–111)
Creatinine, Ser: 0.77 mg/dL (ref 0.61–1.24)
GFR calc Af Amer: >60 mL/min (ref >60)
GFR calc non Af Amer: >60 mL/min (ref >60)
Glucose, Bld: 102 mg/dL — ABNORMAL HIGH (ref 70–99)
Potassium: 4.1 mmol/L (ref 3.5–5.1)
Sodium: 138 mmol/L (ref 135–145)
Total Bilirubin: 0.3 mg/dL (ref 0.3–1.2)
Total Protein: 7.3 g/dL (ref 6.5–8.1)

## 2020-03-09 MED ORDER — IOHEXOL 300 MG/ML  SOLN
100.0000 mL | Freq: Once | INTRAMUSCULAR | Status: AC | PRN
  Administered 2020-03-09: 100 mL via INTRAVENOUS

## 2020-03-09 MED ORDER — SODIUM CHLORIDE 0.9% FLUSH
10.0000 mL | Freq: Once | INTRAVENOUS | Status: AC
  Administered 2020-03-09: 10 mL
  Filled 2020-03-09: qty 10

## 2020-03-09 MED ORDER — HEPARIN SOD (PORK) LOCK FLUSH 10 UNIT/ML IV SOLN
10.0000 [IU] | Freq: Once | INTRAVENOUS | Status: DC
  Filled 2020-03-09: qty 1

## 2020-03-09 MED ORDER — HEPARIN SOD (PORK) LOCK FLUSH 100 UNIT/ML IV SOLN
INTRAVENOUS | Status: AC
  Administered 2020-03-09: 500 [IU]
  Filled 2020-03-09: qty 5

## 2020-03-09 MED ORDER — SODIUM CHLORIDE (PF) 0.9 % IJ SOLN
INTRAMUSCULAR | Status: AC
  Filled 2020-03-09: qty 50

## 2020-03-12 NOTE — Progress Notes (Signed)
[place]   [phone] Fax:(336) [phone]   Clinic Follow up Note   Patient Care Team: [name], MD as PCP - General (Family Medicine) [name], MD as Consulting Physician (Cardiothoracic Surgery)  Date of Service:  03/15/2020  CHIEF COMPLAINT: Follow up esophageal adenocarcinoma  SUMMARY OF ONCOLOGIC HISTORY: Oncology History Overview Note  Cancer Staging Esophageal cancer [facility]) Staging form: Esophagus - Adenocarcinoma, AJCC 8th Edition - Clinical stage from 10/26/2016: Stage III (cT3, cN1, cM0) - Signed by [name], MD on 11/05/2016     Malignant neoplasm of lower third of esophagus ([place])  09/21/2016 - 09/21/2016 Hospital Admission   esophageal pain and vomiting up blood   10/09/2016 Initial Diagnosis   Esophageal cancer ([place])   10/09/2016 Procedure   EGD 1. Partially obstructing, likely malignant esophageal tumor was found in the lower third of the esophagus. Multiple biopsies.  2. Mass visible during gastric retroflexion at GE junction 3. Otherwise normal stomach 4. Normal examined duodenum    10/09/2016 Pathology Results   Esophagus, biopsy, distal esophageal tumor ([id number]) - SUSPICIOUS FOR ADENOCARCINOMA   10/12/2016 Imaging   CT CAP w Contrast IMPRESSION: Distal esophageal mass compatible with primary esophageal malignancy. There are 2 adjacent abnormal appearing subcentimeter paraesophageal lymph nodes which may represent nodal metastasis. Additionally there is a prominent nonspecific 8 mm upper abdominal lymph node.  No evidence for distant metastatic disease in the chest, abdomen or pelvis.   10/24/2016 PET scan   1. Markedly hypermetabolic distal esophageal lesion, compatible with malignancy. Adjacent small paraesophageal lymph nodes are abnormal by CT but cannot be resolved as separate structures from the hypermetabolic esophageal activity on the PET images. No hypermetabolism is demonstrated in the upper  abdominal/gastrohepatic ligament lymph node although the small size of this lymph node may be below threshold for detection on PET imaging. 2. No evidence for distant hypermetabolic metastatic disease in the neck, chest, abdomen, or pelvis.   10/26/2016 Pathology Results   Esophagogastric junction, biopsy, mass - INVASIVE ADENOCARCINOMA.   10/26/2016 Procedure   EUS showed uT3N1 disease, and biopsy confirmed adenocarcinoma    10/30/2016 - 12/07/2016 Radiation Therapy   Neoadjuvant radiation to esophageal cancer  Under the care of Dr. [name]   10/31/2016 - 12/07/2016 Neo-Adjuvant Chemotherapy   Weekly Carboplatin AUC 2 and taxol 50mg /m2 with concurrent radiation     12/14/2016 Imaging   CT AP W Contrast 12/14/16 IMPRESSION: 1. Mildly dilated short segment of proximal jejunum without bowel wall thickening or significant inflammatory changes. Findings may represent focal ileus or radiation enteritis. No evidence for obstruction. 2. Significantly improved appearance of the distal esophagus/proximal stomach with decreased wall thickening (1.1 cm from 2.3 cm), and smaller paraesophageal and gastrohepatic ligament lymph nodes. 3. No bowel obstruction.  Normal appendix.   01/18/2017 Imaging   CT CAP W Contrast 01/18/17 IMPRESSION: 1. Mild stable distal esophageal wall thickening likely due to radiation change. No findings for recurrent tumor. Small paraesophageal lymph nodes are also stable. 2. No findings for metastatic disease. 3. Stable mild/early emphysematous changes and age advanced atherosclerotic calcifications involving the thoracic and abdominal aorta and branch vessels.   04/12/2017 Imaging   CT Chest and Abdomen W Contrast 04/12/17  IMPRESSION: 1. Mild stable distal esophageal wall thickening. No findings for recurrent tumor. 2. Stable small mediastinal and left supraclavicular lymph nodes. 3. There is a tiny nodule within the medial right lower lobe measuring 3 mm. New from  previous exam. Nonspecific. Attention on follow-up  imaging advise. 4.  Aortic Atherosclerosis (ICD10-I70.0). 5. Three vessel coronary artery calcification   04/16/2017 Surgery   VIDEO BRONCHOSCOPY, Transhiatal Total Esophagectomy, Esophagogastrostomy, pyloromotomy, Feeding Jejunostomy ESOPHAGECTOMY COMPLETE,Transhiatal total esophagectomy JEJUNOSTOMY,Feeding by Dr. [name] 04/16/17   04/16/2017 Pathology Results   Diagnosis 04/16/17  1. Omentum, resection for tumor - BENIGN ADIPOSE TISSUE CONSISTENT WITH OMENTUM. - NO EVIDENCE OF MALIGNANCY. 2. Esophagus, resection, w/ GE junction - FIBROSIS WITH PATCHY CHRONIC INFLAMMATION. - NO RESIDUAL CARCINOMA IDENTIFIED. - MARGINS NOT INVOLVED. - TWELVE LYMPH NODES WITH NO METASTATIC CARCINOMA (0/12).    07/16/2017 Procedure   Upper GI Endoscopy Findings: per Dr. [name] - Food was found in the upper third of the esophagus. Removal of food was accomplished with Jabier Mutton net. - A partial esophagectomy anastomosis was found in the upper third of the esophagus (20 cm from incisors). This was characterized by severe stenosis, an intact staple line and visible sutures. The standard adult upper endoscope would not pass before dilation. A TTS dilator was passed through the scope. Dilation with an 8.5-9.5-10.5 mm and then 06-11-12 mm balloon dilator was performed to 12 mm (inspection after 9.5 mm, 10.5 mm, 11 mm and 12 mm). The dilation site was examined and showed moderate and significant improvement in luminal narrowing. Estimated blood loss was minimal. After dilation to 12 mm the endoscope was able to transverse the anastomosis with minimal pressure. - A medium amount of food (residue) was found in the gastric body. - The exam of the stomach was otherwise normal. - The examined duodenum was normal.   08/13/2017 Procedure   Upper GI Endoscopy Findings: per Dr. [name] - Food was found in the proximal esophagus. Removal of food was accomplished with Jabier Mutton  net. - One severe (stenosis; an endoscope cannot pass) benign-appearing, intrinsic stenosis was found 20 cm from the incisors. This measured 1 cm (in length) and was traversed after dilation. A TTS dilator was passed through the scope. Dilation with an 06-11-12 mm balloon dilator was performed to 13 mm (after 3mm and 12 mm). The dilation site was examined and showed moderate improvement in luminal narrowing. At the stricture there is a visible staple on the proximal side and visible suture material on the gastric side. - A medium amount of food (residue) -the duodenum was normal    08/31/2017 Procedure   Endoscopic needle-knife excision of anastomotic stricture by Dr. Lysle Rubens at [facility], [place]   09/11/2017 Imaging   CT CAP W CONTRAST IMPRESSION: 1. Interval esophagectomy with gastric pull-through. No demonstrated complication. There is retained ingested material within the intrathoracic portion of the stomach. 2. No evidence of local recurrence or metastatic disease. 3. Stable small left supraclavicular and superior mediastinal lymph nodes. 4. Mild lower lobe paramediastinal pulmonary opacity bilaterally, likely atelectasis or sequela of prior radiation therapy. 5.  Aortic Atherosclerosis (ICD10-I70.0).    02/28/2018 Procedure   02/28/2018 Upper endoscopy Impression - Benign-appearing esophageal stenosis. Dilated. - A previous surgical anastomosis was found in the proximal stomach. - Normal examined duodenum. - No specimens collected.   04/10/2018 Imaging   04/10/2018 CT CA IMPRESSION: 1. No evidence of metastatic disease. 2. Aortic atherosclerosis (ICD10-170.0). Coronary artery calcification. 3.  Emphysema (ICD10-J43.9).   03/20/2019 Imaging   CT CAP W contrast 03/20/19  IMPRESSION: 1. Stable exam. No new or progressive interval findings to suggest recurrent/metastatic disease. 2.  Aortic Atherosclerois (ICD10-170.0)   03/30/2019 Procedure   EGD with Dr Fuller Plan 03/30/19  IMPRESSION - Food impaction  in the proximal esophagus. Removal was successful. -  Benign-appearing esophageal stenosis at 20 cm. - Ectopic gastric mucosa in the proximal esophagus. - Prior esophagectomy, gastric pull up. Sutures noted in gastric body. - A medium amount of food (residue) in the stomach that obscured visualization. - Normal duodenal bulb and second portion of the duodenum.   05/26/2019 Procedure   Upper Endoscopy by Dr. [name] 05/26/19  IMPRESSION - Benign-appearing esophageal stenosis at anastomosis. Dilated to 19 mm with balloon. Injected with steroid. 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 - Normal examined duodenum. - No specimens collected.   03/09/2020 Imaging   CT CAP w contrast  IMPRESSION: Stable exam. No evidence of recurrent or metastatic carcinoma within the chest, abdomen, or pelvis.   Aortic Atherosclerosis (ICD10-I70.0). Coronary artery atherosclerosis.      CURRENT THERAPY:  Surveillance   INTERVAL HISTORY:  Darrell Cunningham is here for a follow up of esophageal cancer. He was last seen by me 9 months ago. He presents to the clinic alone. He notes he is doing well. He noes he will have strange sensation in his head and will feel dizzy. He notes having sharp pain in his left neck. This will last 1/2 a day very occasionally. He still has productive cough and cough up phlegm. He plans to have another esophageal stretching Endoscopy and colonoscopy. He notes his energy level is not baseline but improving.     REVIEW OF SYSTEMS:   Constitutional: Denies fevers, chills or abnormal weight loss Eyes: Denies blurriness of vision Ears, nose, mouth, throat, and face: Denies mucositis or sore throat Respiratory: Denies dyspnea or wheezes (+) Cough Cardiovascular: Denies palpitation, chest discomfort or lower extremity swelling Gastrointestinal:  Denies nausea, heartburn or change in bowel habits Skin: Denies abnormal skin rashes Lymphatics: Denies new lymphadenopathy  or easy bruising Neurological:Denies numbness, tingling or new weaknesses Behavioral/Psych: Mood is stable, no new changes  All other systems were reviewed with the patient and are negative.  MEDICAL HISTORY:  Past Medical History:  Diagnosis Date  . Allergy   . Arthritis   . Asthma    as a child  . Cancer ([place]) 10/09/2016   ESOPHAGUS CARCINOMA   . COPD (chronic obstructive pulmonary disease) ([place])   . CP (cerebral palsy), spastic ([place])    right  . Dysphagia   . Emphysema of lung ([place])   . Encounter for nonprocreative genetic counseling 10/31/2016   Darrell Cunningham underwent genetic counseling for hereditary cancer syndromes on 10/31/2016. Though he is a candidate for genetic testing, he declines at this time.  Marland Kitchen GERD (gastroesophageal reflux disease)   . History of chemotherapy    03-2017  . History of radiation therapy    03-2017  . Hypertension   . Neuromuscular disorder ([place])    [name]  . Pneumonia 4 yrs ago    SURGICAL HISTORY: Past Surgical History:  Procedure Laterality Date  . COLONOSCOPY    . COMPLETE ESOPHAGECTOMY N/A 04/16/2017   Procedure: ESOPHAGECTOMY COMPLETE,Transhiatal total esophagectomy;  Surgeon: [name], MD;  Location: [facility] OR;  Service: Thoracic;  Laterality: N/A;  . ESOPHAGOGASTRODUODENOSCOPY (EGD) WITH PROPOFOL N/A 03/30/2019   Procedure: ESOPHAGOGASTRODUODENOSCOPY (EGD) WITH PROPOFOL;  Surgeon: Ladene Artist, MD;  Location: [facility] ENDOSCOPY;  Service: Endoscopy;  Laterality: N/A;  . EUS N/A 10/26/2016   Procedure: UPPER ENDOSCOPIC ULTRASOUND (EUS) LINEAR;  Surgeon: [name], MD;  Location: [facility] ENDOSCOPY;  Service: Endoscopy;  Laterality: N/A;  . EYE SURGERY Bilateral age 79    for cross eyes  .  FOREIGN BODY REMOVAL  03/30/2019   Procedure: FOREIGN BODY REMOVAL;  Surgeon: Ladene Artist, MD;  Location: [facility];  Service: Endoscopy;;  . IR FLUORO GUIDE PORT INSERTION RIGHT  11/08/2016   right upper chest  . IR US GUIDE VASC ACCESS RIGHT   11/08/2016  . JEJUNOSTOMY N/A 04/16/2017   Procedure: Donney Rankins;  Surgeon: [name], MD;  Location: [facility] OR;  Service: Thoracic;  Laterality: N/A;  . JEJUNOSTOMY     removal of feeding tube /pt unsure of date  . MOUTH SURGERY    . POLYPECTOMY    . UPPER GASTROINTESTINAL ENDOSCOPY    . VIDEO BRONCHOSCOPY N/A 04/16/2017   Procedure: VIDEO BRONCHOSCOPY, Transhiatal Total Esophagectomy, Esophagogastrostomy, pyloromotomy, Feeding Jejunostomy;  Surgeon: [name], MD;  Location: [facility] OR;  Service: Thoracic;  Laterality: N/A;    I have reviewed the social history and family history with the patient and they are unchanged from previous note.  ALLERGIES:  is allergic to penicillins.  MEDICATIONS:  Current Outpatient Medications  Medication Sig Dispense Refill  . acetaminophen (TYLENOL) 160 MG/5ML solution Take 10.2 mLs (325 mg total) by mouth every 4 (four) hours as needed for moderate pain. 120 mL 0  . acetaminophen (TYLENOL) 500 MG tablet Take 500 mg by mouth every 6 (six) hours as needed.    Marland Kitchen albuterol (ACCUNEB) 0.63 MG/3ML nebulizer solution Take 1 ampule by nebulization every 6 (six) hours as needed for wheezing.    . cyclobenzaprine (FLEXERIL) 5 MG tablet Take 1 tablet (5 mg total) by mouth 3 (three) times daily as needed for muscle spasms. 30 tablet 0  . fluticasone (FLONASE) 50 MCG/ACT nasal spray Place 1 spray into both nostrils daily as needed for allergies or rhinitis.    . fluticasone (FLOVENT DISKUS) 50 MCG/BLIST diskus inhaler Inhale into the lungs.    . pantoprazole (PROTONIX) 40 MG tablet Take 1 tablet (40 mg total) by mouth 2 (two) times daily. 60 tablet 3   No current facility-administered medications for this visit.    PHYSICAL EXAMINATION: ECOG PERFORMANCE STATUS: 1 - Symptomatic but completely ambulatory  Vitals:   03/15/20 1115  BP: 117/75  Pulse: 76  Resp: 17  Temp: 98.6 F (37 C)  SpO2: 100%   Filed Weights   03/15/20 1115  Weight: 130  lb (59 kg)    Due to COVID19 we will limit examination to appearance. Patient had no complaints.  GENERAL:alert, no distress and comfortable SKIN: skin color normal, no rashes or significant lesions EYES: normal, Conjunctiva are pink and non-injected, sclera clear  NEURO: alert & oriented x 3 with fluent speech   LABORATORY DATA:  I have reviewed the data as listed CBC Latest Ref Rng & Units 03/09/2020 06/13/2019 03/30/2019  WBC 4.0 - 10.5 K/uL 6.6 8.4 7.0  Hemoglobin 13.0 - 17.0 g/dL 13.8 14.9 14.5  Hematocrit 39 - 52 % 41.4 44.2 43.1  Platelets 150 - 400 K/uL 191 183 199     CMP Latest Ref Rng & Units 03/09/2020 06/13/2019 03/30/2019  Glucose 70 - 99 mg/dL 102(H) 98 124(H)  BUN 6 - 20 mg/dL 16 13 17   Creatinine 0.61 - 1.24 mg/dL 0.77 0.78 0.72  Sodium 135 - 145 mmol/L 138 139 137  Potassium 3.5 - 5.1 mmol/L 4.1 4.3 3.3(L)  Chloride 98 - 111 mmol/L 106 104 104  CO2 22 - 32 mmol/L 24 25 22   Calcium 8.9 - 10.3 mg/dL 9.6 9.5 9.0  Total Protein 6.5 - 8.1 g/dL  7.3 7.8 7.1  Total Bilirubin 0.3 - 1.2 mg/dL 0.3 0.5 0.5  Alkaline Phos 38 - 126 U/L 78 84 74  AST 15 - 41 U/L 14(L) 17 18  ALT 0 - 44 U/L 12 15 17       RADIOGRAPHIC STUDIES: I have personally reviewed the radiological images as listed and agreed with the findings in the report. No results found.   ASSESSMENT & PLAN:  Darrell Cunningham is a 59 y.o. male with    1. Low esophageal Adenocarcinoma, cT3N1M0, stage III, ypT0N0 -He was diagnosed in 09/2016. He is s/p neoadjuvant ChemoRT andesophagectomy.he achieved complete pathological response to neoadjuvant therapy  -His risk of recurrence is low.I recommended cancer surveillance for 5 years. -He has had multiple EGD for dilatation. His last EGD in 03/2019 for obstruction, no evidence of local recurrence on EGD. -I personally reviewed and discussed his CT CAP from 03/09/20 with pt which showed NED. He has aortic Atherosclerosis.  -He is clinically stable. Labs reviewed,  except BG 102, albumin 3.4. There is no clinical concern for recurrence.  -He is ready to have PAC removed by IR.  -He is almost 3.5 years since cancer diagnosis. His risk of recurrence has significantly decreased. Continue Surveillance.  -f/u in 9 months. Continue to f/u with Dr. Pia Mau in the interim.    2. History of heavy smoking -He has quit smoking completely, history of 40 pack year.  3. Malnutrition and weight loss  -His nutrition and eating has much improved. He has had multiple esophageal stretch and manages eating well  -I encouraged him to continue nutritional supplement -His weight is stable. He continues to sit propped up.   4. Esophageal stricture  -Secondary to previous surgery and radiation -Follow-up with GI Dr. [name] for dilatationas needed -He had esophageal obstruction due to food impaction in 03/2019. He underwent EGD and repeat upper endoscopy in 05/2019.  -S/p treatment and surgery he still has productive cough and will spit out phlegm as needed.  -He plans to repeat esophageal stretching with endoscopy and colonoscopy soon.    Plan: -CT CAP reviewed, NED -referral to IR for PAC removal.  -Lab and F/u in 9 months, he will f/u with Dr. Pia Mau in the interim    No problem-specific [place] notes found for this encounter.   Orders Placed This Encounter  Procedures  . IR Removal Tun Access W/ Port W/O FL    Standing Status:   Future    Standing Expiration Date:   03/15/2021    Order Specific Question:   Reason for exam:    Answer:   port removal    Order Specific Question:   Preferred Imaging Location?    Answer:   [facility]   All questions were answered. The patient knows to call the clinic with any problems, questions or concerns. No barriers to learning was detected. The total time spent in the appointment was 30 minutes.     [name], MD 03/15/2020   I, Joslyn Devon, am acting as scribe for [name], MD.   I have  reviewed the above documentation for accuracy and completeness, and I agree with the above.

## 2020-03-15 ENCOUNTER — Inpatient Hospital Stay (HOSPITAL_BASED_OUTPATIENT_CLINIC_OR_DEPARTMENT_OTHER): Payer: Medicaid Other | Admitting: Hematology

## 2020-03-15 ENCOUNTER — Other Ambulatory Visit: Payer: Self-pay

## 2020-03-15 ENCOUNTER — Encounter: Payer: Self-pay | Admitting: Hematology

## 2020-03-15 VITALS — BP 117/75 | HR 76 | Temp 98.6°F | Resp 17 | Ht 61.0 in | Wt 130.0 lb

## 2020-03-15 DIAGNOSIS — C155 Malignant neoplasm of lower third of esophagus: Secondary | ICD-10-CM | POA: Diagnosis not present

## 2020-03-17 ENCOUNTER — Other Ambulatory Visit: Payer: Self-pay

## 2020-03-17 ENCOUNTER — Encounter: Payer: Self-pay | Admitting: Internal Medicine

## 2020-03-17 ENCOUNTER — Ambulatory Visit (AMBULATORY_SURGERY_CENTER): Payer: Medicaid Other | Admitting: Internal Medicine

## 2020-03-17 VITALS — BP 105/60 | HR 93 | Temp 97.3°F | Resp 10 | Ht 61.0 in | Wt 130.0 lb

## 2020-03-17 DIAGNOSIS — R1310 Dysphagia, unspecified: Secondary | ICD-10-CM

## 2020-03-17 DIAGNOSIS — D123 Benign neoplasm of transverse colon: Secondary | ICD-10-CM

## 2020-03-17 DIAGNOSIS — K635 Polyp of colon: Secondary | ICD-10-CM

## 2020-03-17 DIAGNOSIS — J449 Chronic obstructive pulmonary disease, unspecified: Secondary | ICD-10-CM | POA: Diagnosis not present

## 2020-03-17 DIAGNOSIS — K222 Esophageal obstruction: Secondary | ICD-10-CM

## 2020-03-17 DIAGNOSIS — K219 Gastro-esophageal reflux disease without esophagitis: Secondary | ICD-10-CM | POA: Diagnosis not present

## 2020-03-17 DIAGNOSIS — I10 Essential (primary) hypertension: Secondary | ICD-10-CM | POA: Diagnosis not present

## 2020-03-17 DIAGNOSIS — Z86010 Personal history of colonic polyps: Secondary | ICD-10-CM | POA: Diagnosis not present

## 2020-03-17 DIAGNOSIS — K31819 Angiodysplasia of stomach and duodenum without bleeding: Secondary | ICD-10-CM

## 2020-03-17 DIAGNOSIS — Z8501 Personal history of malignant neoplasm of esophagus: Secondary | ICD-10-CM

## 2020-03-17 MED ORDER — SODIUM CHLORIDE 0.9 % IV SOLN: 500.0000 mL | Freq: Once | INTRAVENOUS | Status: DC

## 2020-03-17 NOTE — Progress Notes (Signed)
Pt's states no medical or surgical changes since previsit or office visit. 

## 2020-03-17 NOTE — Op Note (Signed)
[place] Patient Name: Darrell Cunningham Procedure Date: 03/17/2020 9:14 AM MRN: [id number] Endoscopist: [name] , MD Age: 59 Referring MD:  Date of Birth: 04/07/1961 Gender: Male Account #: [id number] Procedure:                Upper GI endoscopy Indications:              Dysphagia, personal history of esophageal                            adenocarcinoma status post esophagectomy with                            gastric pull-through; history of gastroesophageal                            anastomotic stricture with multiple previous                            dilations the last being October 2020 with                            intralesional steroid injection Medicines:                Monitored Anesthesia Care Procedure:                Pre-Anesthesia Assessment:                           - Prior to the procedure, a History and Physical                            was performed, and patient medications and                            allergies were reviewed. The patient's tolerance of                            previous anesthesia was also reviewed. The risks                            and benefits of the procedure and the sedation                            options and risks were discussed with the patient.                            All questions were answered, and informed consent                            was obtained. Prior Anticoagulants: The patient has                            taken no previous anticoagulant or antiplatelet  agents. ASA Grade Assessment: III - A patient with                            severe systemic disease. After reviewing the risks                            and benefits, the patient was deemed in                            satisfactory condition to undergo the procedure.                           After obtaining informed consent, the endoscope was                            passed under direct vision. Throughout the                             procedure, the patient's blood pressure, pulse, and                            oxygen saturations were monitored continuously. The                            Endoscope was introduced through the mouth, and                            advanced to the second part of duodenum. The upper                            GI endoscopy was accomplished without difficulty.                            The patient tolerated the procedure well. Scope In: Scope Out: Findings:                 One benign-appearing, intrinsic moderate                            (circumferential scarring or stenosis; an endoscope                            may pass) stenosis was found 20 cm from the                            incisors. This stenosis measured 1.5 cm (inner                            diameter) x less than one cm (in length). The                            stenosis was traversed. A TTS dilator was passed  through the scope. Dilation with an 18-19-20 mm                            balloon dilator was performed to 20 mm. The                            dilation site was examined and showed mild mucosal                            disruption.                           A single 2 mm angioectasia with no bleeding was                            found in the gastric body.                           Evidence of a prior esophagectomy with gastric                            pull-through evident in the gastric body. This was                            characterized by healthy appearing mucosa and                            visible sutures.                           The exam of the stomach was otherwise normal.                           The examined duodenum was normal. Complications:            No immediate complications. Estimated Blood Loss:     Estimated blood loss was minimal. Impression:               - Benign-appearing esophageal stenosis/anastomotic                             stricture. Overall much improved compared to years                            past. Dilated to 20 mm with balloon.                           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                           - Prior esophagectomy with visible sutures and                            healthy appearing mucosa in the proximal stomach.                           -  Normal examined duodenum.                           - No specimens collected. Recommendation:           - Patient has a contact number available for                            emergencies. The signs and symptoms of potential                            delayed complications were discussed with the                            patient. Return to normal activities tomorrow.                            Written discharge instructions were provided to the                            patient.                           - Resume previous diet.                           - Continue present medications.                           - Repeat esophageal dilation as needed. [name], MD 03/17/2020 9:57:42 AM This report has been signed electronically.

## 2020-03-17 NOTE — Patient Instructions (Addendum)
Start off with liquids then on to soft foods then to regular diet as tolerated  Repeat Colonoscopy in 7 years  Handouts on diverticulosis & hemorrhoids given to you today   YOU HAD AN ENDOSCOPIC PROCEDURE TODAY AT [facility]:   Refer to the procedure report that was given to you for any specific questions about what was found during the examination.  If the procedure report does not answer your questions, please call your gastroenterologist to clarify.  If you requested that your care partner not be given the details of your procedure findings, then the procedure report has been included in a sealed envelope for you to review at your convenience later.  YOU SHOULD EXPECT: Some feelings of bloating in the abdomen. Passage of more gas than usual.  Walking can help get rid of the air that was put into your GI tract during the procedure and reduce the bloating. If you had a lower endoscopy (such as a colonoscopy or flexible sigmoidoscopy) you may notice spotting of blood in your stool or on the toilet paper. If you underwent a bowel prep for your procedure, you may not have a normal bowel movement for a few days.  Please Note:  You might notice some irritation and congestion in your nose or some drainage.  This is from the oxygen used during your procedure.  There is no need for concern and it should clear up in a day or so.  SYMPTOMS TO REPORT IMMEDIATELY:   Following lower endoscopy (colonoscopy or flexible sigmoidoscopy):  Excessive amounts of blood in the stool  Significant tenderness or worsening of abdominal pains  Swelling of the abdomen that is new, acute  Fever of 100F or higher   Following upper endoscopy (EGD)  Vomiting of blood or coffee ground material  New chest pain or pain under the shoulder blades  Painful or persistently difficult swallowing  New shortness of breath  Fever of 100F or higher  Black, tarry-looking stools  For urgent or emergent issues,  a gastroenterologist can be reached at any hour by calling [phone]. Do not use MyChart messaging for urgent concerns.    DIET:  We do recommend a small meal at first, but then you may proceed to your regular diet.  Drink plenty of fluids but you should avoid alcoholic beverages for 24 hours.  ACTIVITY:  You should plan to take it easy for the rest of today and you should NOT DRIVE or use heavy machinery until tomorrow (because of the sedation medicines used during the test).    FOLLOW UP: Our staff will call the number listed on your records 48-72 hours following your procedure to check on you and address any questions or concerns that you may have regarding the information given to you following your procedure. If we do not reach you, we will leave a message.  We will attempt to reach you two times.  During this call, we will ask if you have developed any symptoms of COVID 19. If you develop any symptoms (ie: fever, flu-like symptoms, shortness of breath, cough etc.) before then, please call [phone].  If you test positive for Covid 19 in the 2 weeks post procedure, please call and report this information to [place].    If any biopsies were taken you will be contacted by phone or by letter within the next 1-3 weeks.  Please call us at [phone] if you have not heard about the biopsies in 3 weeks.  SIGNATURES/CONFIDENTIALITY: You and/or your care partner have signed paperwork which will be entered into your electronic medical record.  These signatures attest to the fact that that the information above on your After Visit Summary has been reviewed and is understood.  Full responsibility of the confidentiality of this discharge information lies with you and/or your care-partner.

## 2020-03-17 NOTE — Progress Notes (Signed)
Called to room to assist during endoscopic procedure.  Patient ID and intended procedure confirmed with present staff. Received instructions for my participation in the procedure from the performing physician.  

## 2020-03-17 NOTE — Progress Notes (Signed)
Robinul 0.1 mg IV given due large amount of secretions upon assessment.  MD made aware, vss 

## 2020-03-17 NOTE — Progress Notes (Signed)
Patient experiencing nausea and vomiting.  MD updated and Zofran 4 mg IV given, vss  

## 2020-03-17 NOTE — Progress Notes (Signed)
Report given to PACU, vss 

## 2020-03-17 NOTE — Progress Notes (Signed)
HR > 100 with esmolol 25 mg given IV, MD updated, vss 

## 2020-03-17 NOTE — Progress Notes (Signed)
No need for dilatation diet per Dr [name] ;therefore ,instructed pt to start liquids then soft then regular diet as toloerated

## 2020-03-17 NOTE — Op Note (Signed)
[facility] Patient Name: Darrell Cunningham Procedure Date: 03/17/2020 9:14 AM MRN: [id number] Endoscopist: [name] , MD Age: 59 Referring MD:  Date of Birth: 09-01-1960 Gender: Male Account #: [id number] Procedure:                Colonoscopy Indications:              High risk colon cancer surveillance: Personal                            history of non-advanced adenoma, Last colonoscopy:                            March 2016 Medicines:                Monitored Anesthesia Care Procedure:                Pre-Anesthesia Assessment:                           - Prior to the procedure, a History and Physical                            was performed, and patient medications and                            allergies were reviewed. The patient's tolerance of                            previous anesthesia was also reviewed. The risks                            and benefits of the procedure and the sedation                            options and risks were discussed with the patient.                            All questions were answered, and informed consent                            was obtained. Prior Anticoagulants: The patient has                            taken no previous anticoagulant or antiplatelet                            agents. ASA Grade Assessment: III - A patient with                            severe systemic disease. After reviewing the risks                            and benefits, the patient was deemed in  satisfactory condition to undergo the procedure.                           After obtaining informed consent, the colonoscope                            was passed under direct vision. Throughout the                            procedure, the patient's blood pressure, pulse, and                            oxygen saturations were monitored continuously. The                            Colonoscope was introduced through the anus and                             advanced to the cecum, identified by appendiceal                            orifice and ileocecal valve. The colonoscopy was                            somewhat difficult due to significant looping. The                            patient tolerated the procedure well. The quality                            of the bowel preparation was good. The ileocecal                            valve, appendiceal orifice, and rectum were                            photographed. Scope In: 9:29:28 AM Scope Out: 9:48:24 AM Scope Withdrawal Time: 0 hours 10 minutes 55 seconds  Total Procedure Duration: 0 hours 18 minutes 56 seconds  Findings:                 The digital rectal exam was normal.                           A 6 mm polyp was found in the splenic flexure. The                            polyp was sessile. The polyp was removed with a                            cold snare. Resection was complete, but the polyp                            tissue was not retrieved.  Scattered small-mouthed diverticula were found in                            the sigmoid colon and descending colon.                           Retroflexion in the rectum was not performed due to                            narrow rectal vault. No abnormalities were found in                            the distal rectum or anal canal on forward views                            from multiple angles. Complications:            No immediate complications. Estimated Blood Loss:     Estimated blood loss was minimal. Impression:               - One 6 mm polyp at the splenic flexure, removed                            with a cold snare. Complete resection. Polyp tissue                            not retrieved.                           - Diverticulosis in the sigmoid colon and in the                            descending colon. Recommendation:           - Patient has a contact number available for                             emergencies. The signs and symptoms of potential                            delayed complications were discussed with the                            patient. Return to normal activities tomorrow.                            Written discharge instructions were provided to the                            patient.                           - Resume previous diet.                           - Continue present medications.                           -  Await pathology results.                           - Repeat colonoscopy in 7 years for surveillance                            (polyp not retrieved but clinically consistent with                            adenoma). [name], MD 03/17/2020 10:00:40 AM This report has been signed electronically.

## 2020-03-19 ENCOUNTER — Telehealth: Payer: Self-pay

## 2020-03-19 NOTE — Telephone Encounter (Signed)
  Follow up Call-  Call back number 03/17/2020 05/26/2019 09/24/2018 08/19/2018 07/04/2018 06/03/2018 04/15/2018  Post procedure Call Back phone  # [phone]  Permission to leave phone message Yes Yes Yes Yes Yes Yes Yes  Some recent data might be hidden     Patient questions:  Do you have a fever, pain , or abdominal swelling? No. Pain Score  0 *  Have you tolerated food without any problems? Yes.    Have you been able to return to your normal activities? Yes.    Do you have any questions about your discharge instructions: Diet   No. Medications  No. Follow up visit  No.  Do you have questions or concerns about your Care? No.  Actions: * If pain score is 4 or above: No action needed, pain <4.  1. Have you developed a fever since your procedure? no  2.   Have you had an respiratory symptoms (SOB or cough) since your procedure? no  3.   Have you tested positive for COVID 19 since your procedure no  4.   Have you had any family members/close contacts diagnosed with the COVID 19 since your procedure?  no   If yes to any of these questions please route to Joylene John, RN and Joella Prince, RN

## 2020-03-25 ENCOUNTER — Ambulatory Visit: Payer: Medicaid Other | Admitting: Cardiothoracic Surgery

## 2020-03-31 NOTE — Progress Notes (Signed)
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 PROCEDURES PERFORMED:  Bronchoscopy, transhiatal total esophagectomy with cervical esophagogastrostomy, pyloromyotomy, and feeding jejunostomy tube. SURGEON:  [name], MD.  Cancer Staging Malignant neoplasm of lower third of esophagus [facility]) Staging form: Esophagus - Adenocarcinoma, AJCC 8th Edition - Clinical stage from 10/26/2016: Stage III (cT3, cN1, cM0) - Signed by [name], MD on 11/05/2016 - Pathologic stage from 04/20/2017: Stage I (ypT0, pN0, cM0, GX) - Signed by [name], MD on 04/23/2017   History of Present Illness:      Patient returns to the office today for postop check after  Esophagectomy for a stage III adenocarcinoma of the GE junction , he received radiation chemotherapy preoperatively and surgical resection September 2018, pathologically staged as stage I.  The patient has required dilatation of his cervical esophagus early postoperatively, most recently dilated one  month ago.   Patient notes that the time of his last dilatation his symptoms have not been as severe but he has had improvement.   Zubrod Score: At the time of surgery this patient's most appropriate activity status/level should be described as: [x]     0    Normal activity, no symptoms []     1    Restricted in physical strenuous activity but ambulatory, able to do out light work []     2    Ambulatory and capable of self care, unable to do work activities, up and about                  >50 % of waking hours                                                                                   []     3    Only limited self care, in bed greater than 50% of waking hours []     4    Completely disabled, no self care, confined to bed or chair []     5    Moribund  Social History   Tobacco Use  Smoking Status Former Smoker  . Packs/day: 1.00  . Years: 38.00  . Pack years: 38.00  . Types: Cigarettes  . Start date: 10/31/2012  . Quit date: 10/31/2012  . Years since quitting: 7.4  Smokeless Tobacco Never Used       Allergies  Allergen Reactions  . Penicillins Hives, Itching and Swelling    Has patient had a PCN reaction causing immediate rash, facial/tongue/throat swelling, SOB or lightheadedness with hypotension: No Has patient had a PCN reaction causing severe rash involving mucus membranes or skin necrosis:  No Has patient had a PCN reaction that required hospitalization No Has patient had a PCN reaction occurring within the last 10 years: No If all of the above answers are "NO", then may proceed with Cephalosporin use. Unknown Childhood reaction    Current Outpatient Medications  Medication Sig Dispense Refill  . acetaminophen (TYLENOL) 160 MG/5ML solution Take 10.2 mLs (325 mg total) by mouth every 4 (four) hours as needed for moderate pain. 120 mL 0  . acetaminophen (TYLENOL) 500 MG tablet Take 500 mg by mouth every 6 (six) hours as needed.    Marland Kitchen albuterol (ACCUNEB) 0.63 MG/3ML nebulizer solution Take 1 ampule by nebulization every 6 (six) hours as needed for wheezing.    . cyclobenzaprine (FLEXERIL) 5 MG tablet Take 1 tablet (5 mg total) by mouth 3 (three) times daily as needed for muscle spasms. 30 tablet 0  . fluticasone (FLONASE) 50 MCG/ACT nasal spray Place 1 spray into both nostrils daily as needed for allergies or rhinitis.    . fluticasone (FLOVENT DISKUS) 50 MCG/BLIST diskus inhaler Inhale into the lungs.    . pantoprazole (PROTONIX) 40 MG tablet Take 1  tablet (40 mg total) by mouth 2 (two) times daily. 60 tablet 3   No current facility-administered medications for this visit.    Physical Exam: BP 115/74   Pulse 77   Temp 97.7 F (36.5 C) (Skin)   Resp 20   Ht 5\' 1"  (1.549 m)   Wt 129 lb (58.5 kg)   SpO2 97% Comment: RA  BMI 24.37 kg/m   General appearance: alert, cooperative and no distress Head: Normocephalic, without obvious abnormality, atraumatic Neck: no adenopathy, no carotid bruit, no JVD, supple, symmetrical, trachea midline and thyroid not enlarged, symmetric, no tenderness/mass/nodules Lymph nodes: Cervical, supraclavicular, and axillary nodes normal. Resp: clear to auscultation bilaterally Cardio: regular rate and rhythm, S1, S2 normal, no murmur, click, rub or gallop GI: soft, non-tender; bowel sounds normal; no masses,  no organomegaly Extremities: extremities normal, atraumatic, no cyanosis or edema and Homans sign is negative, no sign of DVT Neurologic: Grossly normal  Abdominal and left neck incisions well-healed.   Diagnostic Studies & Laboratory data:         Recent Radiology Findings: CT Chest W Contrast/ CT Abdomen Pelvis W Contrast  Result Date: 03/09/2020 CLINICAL DATA:  Follow-up esophageal carcinoma. EXAM: CT CHEST, ABDOMEN, AND PELVIS WITH CONTRAST TECHNIQUE: Multidetector CT imaging of the chest, abdomen and pelvis was performed following the standard protocol during bolus administration of intravenous contrast. CONTRAST:  122mL OMNIPAQUE IOHEXOL 300 MG/ML  SOLN COMPARISON:  03/20/2019 FINDINGS: CT CHEST FINDINGS Cardiovascular: No acute findings. Stable mild pericardial thickening. Aortic and coronary artery atherosclerosis noted. Mediastinum/Lymph Nodes: Stable postop changes from esophagectomy with gastric pull-through. No masses or pathologically enlarged lymph nodes identified. Lungs/Pleura: No pulmonary infiltrate or mass identified. No effusion present. Musculoskeletal:  No suspicious bone lesions  identified. CT ABDOMEN AND PELVIS FINDINGS Hepatobiliary: No masses identified. Gallbladder is unremarkable. No evidence of biliary ductal dilatation. Pancreas:  No mass or inflammatory changes. Spleen:  Within normal limits in size and appearance. Adrenals/Urinary tract:  No masses or hydronephrosis. Stomach/Bowel: No evidence of obstruction, inflammatory process, or abnormal fluid collections. Vascular/Lymphatic: No pathologically enlarged lymph nodes identified. No abdominal aortic aneurysm. Aortic atherosclerosis noted. Reproductive:  No mass or other significant abnormality identified. Other:  None. Musculoskeletal:  No suspicious bone lesions identified. IMPRESSION: Stable exam. No evidence of recurrent or metastatic carcinoma within the chest, abdomen, or pelvis. Aortic  Atherosclerosis (ICD10-I70.0). Coronary artery atherosclerosis. Electronically Signed   By: Marlaine Hind M.D.   On: 03/09/2020 11:45   I have independently reviewed the above radiology studies  and reviewed the findings with the patient.      Recent Labs: Lab Results  Component Value Date   WBC 7.0 04/12/2020   HGB 14.4 04/12/2020   HCT 45.5 04/12/2020   PLT 175 04/12/2020   GLUCOSE 102 (H) 03/09/2020   CHOL 157 10/27/2014   TRIG 83 10/27/2014   HDL 34 (L) 10/27/2014   LDLCALC 106 (H) 10/27/2014   ALT 12 03/09/2020   AST 14 (L) 03/09/2020   NA 138 03/09/2020   K 4.1 03/09/2020   CL 106 03/09/2020   CREATININE 0.77 03/09/2020   BUN 16 03/09/2020   CO2 24 03/09/2020   INR 0.9 04/12/2020   HGBA1C 5.7 (H) 10/27/2014   Wt Readings from Last 3 Encounters:  04/01/20 129 lb (58.5 kg)  03/17/20 130 lb (59 kg)  03/15/20 130 lb (59 kg)      Assessment / Plan:   Now 3 years postop Patient stable after transhiatal total esophagectomyCancer Staging Malignant neoplasm of lower third of esophagus ([facility]) Staging form: Esophagus - Adenocarcinoma, AJCC 8th Edition - Clinical stage from 10/26/2016: Stage III (cT3, cN1,  cM0) - Signed by [name], MD on 11/05/2016 - Pathologic stage from 04/20/2017: Stage I (ypT0, pN0, cM0, GX) - Signed by [name], MD on 04/23/2017  Patient now 3  years after esophagectomy, stable without evidence of recurrence. Patient continues to be followed in the medical oncology office, he is to have a repeat CT scan in 1 year Is agreeable to come to the surgical office as needed we will continue follow-up CT scans through the medical oncology office-      [name] 04/13/2020 10:39 AM

## 2020-04-01 ENCOUNTER — Ambulatory Visit: Payer: Medicaid Other | Admitting: Cardiothoracic Surgery

## 2020-04-01 ENCOUNTER — Other Ambulatory Visit: Payer: Self-pay

## 2020-04-01 VITALS — BP 115/74 | HR 77 | Temp 97.7°F | Resp 20 | Ht 61.0 in | Wt 129.0 lb

## 2020-04-01 DIAGNOSIS — C159 Malignant neoplasm of esophagus, unspecified: Secondary | ICD-10-CM

## 2020-04-01 DIAGNOSIS — Z09 Encounter for follow-up examination after completed treatment for conditions other than malignant neoplasm: Secondary | ICD-10-CM | POA: Diagnosis not present

## 2020-04-09 ENCOUNTER — Other Ambulatory Visit: Payer: Self-pay | Admitting: Radiology

## 2020-04-12 ENCOUNTER — Ambulatory Visit (HOSPITAL_COMMUNITY)
Admission: RE | Admit: 2020-04-12 | Discharge: 2020-04-12 | Disposition: A | Discharge status: Home / Self Care | Payer: Medicaid Other | Source: Ambulatory Visit | Attending: Hematology | Admitting: Hematology

## 2020-04-12 ENCOUNTER — Other Ambulatory Visit: Payer: Self-pay

## 2020-04-12 ENCOUNTER — Encounter (HOSPITAL_COMMUNITY): Payer: Self-pay

## 2020-04-12 DIAGNOSIS — C155 Malignant neoplasm of lower third of esophagus: Secondary | ICD-10-CM | POA: Insufficient documentation

## 2020-04-12 DIAGNOSIS — Z452 Encounter for adjustment and management of vascular access device: Secondary | ICD-10-CM | POA: Diagnosis not present

## 2020-04-12 HISTORY — DX: Dyspnea, unspecified: R06.00

## 2020-04-12 HISTORY — PX: IR REMOVAL TUN ACCESS W/ PORT W/O FL MOD SED: IMG2290

## 2020-04-12 LAB — CBC WITH DIFFERENTIAL/PLATELET
Abs Immature Granulocytes: 0.02 10*3/uL (ref 0.00–0.07)
Basophils Absolute: 0 10*3/uL (ref 0.0–0.1)
Basophils Relative: 1 %
Eosinophils Absolute: 0.2 10*3/uL (ref 0.0–0.5)
Eosinophils Relative: 2 %
HCT: 45.5 % (ref 39.0–52.0)
Hemoglobin: 14.4 g/dL (ref 13.0–17.0)
Immature Granulocytes: 0 %
Lymphocytes Relative: 26 %
Lymphs Abs: 1.8 10*3/uL (ref 0.7–4.0)
MCH: 32.2 pg (ref 26.0–34.0)
MCHC: 31.6 g/dL (ref 30.0–36.0)
MCV: 101.8 fL — ABNORMAL HIGH (ref 80.0–100.0)
Monocytes Absolute: 0.6 10*3/uL (ref 0.1–1.0)
Monocytes Relative: 8 %
Neutro Abs: 4.4 10*3/uL (ref 1.7–7.7)
Neutrophils Relative %: 63 %
Platelets: 175 10*3/uL (ref 150–400)
RBC: 4.47 MIL/uL (ref 4.22–5.81)
RDW: 13 % (ref 11.5–15.5)
WBC: 7 10*3/uL (ref 4.0–10.5)
nRBC: 0 % (ref 0.0–0.2)

## 2020-04-12 LAB — PROTIME-INR
INR: 0.9 (ref 0.8–1.2)
Prothrombin Time: 12.1 seconds (ref 11.4–15.2)

## 2020-04-12 MED ORDER — LIDOCAINE-EPINEPHRINE 1 %-1:100000 IJ SOLN
INTRAMUSCULAR | Status: AC
  Filled 2020-04-12: qty 1

## 2020-04-12 MED ORDER — CEFAZOLIN SODIUM-DEXTROSE 2-4 GM/100ML-% IV SOLN
INTRAVENOUS | Status: AC
  Filled 2020-04-12: qty 100

## 2020-04-12 MED ORDER — CLINDAMYCIN PHOSPHATE 900 MG/50ML IV SOLN: 900.0000 mg | Freq: Once | INTRAVENOUS | Status: AC

## 2020-04-12 MED ORDER — LIDOCAINE-EPINEPHRINE 1 %-1:100000 IJ SOLN
INTRAMUSCULAR | Status: AC | PRN
  Administered 2020-04-12: 10 mL via INTRADERMAL

## 2020-04-12 MED ORDER — CLINDAMYCIN PHOSPHATE 900 MG/50ML IV SOLN
INTRAVENOUS | Status: AC
  Administered 2020-04-12: 900 mg via INTRAVENOUS
  Filled 2020-04-12: qty 50

## 2020-04-12 MED ORDER — SODIUM CHLORIDE 0.9 % IV SOLN: INTRAVENOUS | Status: DC

## 2020-04-12 NOTE — Procedures (Signed)
Interventional Radiology Procedure Note ° °Procedure: RT IJ PORT REMOVAL   ° °Complications: None ° °Estimated Blood Loss:  MIN ° °Findings: °FULL REPORT IN PACS °   ° °M. TREVOR Katlin Bortner, MD ° ° ° °

## 2020-04-12 NOTE — Discharge Instructions (Signed)
Urgent needs - IR on call MD [phone] ° °Wound - May remove dressing and shower in 24 to 48 hours.  Keep site clean and dry.  Replace with bandaid. Do not submerge in tub or water until site healing well. ° °. Implanted Port Removal, Care After °This sheet gives you information about how to care for yourself after your procedure. Your health care provider may also give you more specific instructions. If you have problems or questions, contact your health care provider. °What can I expect after the procedure? °After the procedure, it is common to have: °· Soreness or pain near your incision. °· Some swelling or bruising near your incision. °Follow these instructions at home: °Medicines °· Take over-the-counter and prescription medicines only as told by your health care provider. °· If you were prescribed an antibiotic medicine, take it as told by your health care provider. Do not stop taking the antibiotic even if you start to feel better. °Bathing °· Do not take baths, swim, or use a hot tub until your health care provider approves. Ask your health care provider if you can take showers. You may only be allowed to take sponge baths. °Incision care °· Follow instructions from your health care provider about how to take care of your incision. Make sure you: °? Wash your hands with soap and water before you change your bandage (dressing). If soap and water are not available, use hand sanitizer. °? Change your dressing as told by your health care provider. °? Keep your dressing dry. °? Leave stitches (sutures), skin glue, or adhesive strips in place. These skin closures may need to stay in place for 2 weeks or longer. If adhesive strip edges start to loosen and curl up, you may trim the loose edges. Do not remove adhesive strips completely unless your health care provider tells you to do that. °· Check your incision area every day for signs of infection. Check for: °? More redness, swelling, or pain. °? More fluid or  blood. °? Warmth. °? Pus or a bad smell. °Driving °· Do not drive for 24 hours if you were given a medicine to help you relax (sedative) during your procedure. °· If you did not receive a sedative, ask your health care provider when it is safe to drive. °Activity °· Return to your normal activities as told by your health care provider. Ask your health care provider what activities are safe for you. °· Do not lift anything that is heavier than 10 lb (4.5 kg), or the limit that you are told, until your health care provider says that it is safe. °· Do not do activities that involve lifting your arms over your head. °General instructions °· Do not use any products that contain nicotine or tobacco, such as cigarettes and e-cigarettes. These can delay healing. If you need help quitting, ask your health care provider. °· Keep all follow-up visits as told by your health care provider. This is important. °Contact a health care provider if: °· You have more redness, swelling, or pain around your incision. °· You have more fluid or blood coming from your incision. °· Your incision feels warm to the touch. °· You have pus or a bad smell coming from your incision. °· You have pain that is not relieved by your pain medicine. °Get help right away if you have: °· A fever or chills. °· Chest pain. °· Difficulty breathing. °Summary °· After the procedure, it is common to have pain, soreness, swelling,   or bruising near your incision. °· If you were prescribed an antibiotic medicine, take it as told by your health care provider. Do not stop taking the antibiotic even if you start to feel better. °· Do not drive for 24 hours if you were given a sedative during your procedure. °· Return to your normal activities as told by your health care provider. Ask your health care provider what activities are safe for you. °This information is not intended to replace advice given to you by your health care provider. Make sure you discuss any  questions you have with your health care provider. °Document Revised: 08/30/2017 Document Reviewed: 08/30/2017 °Elsevier Patient Education © 2020 [employer] ° °

## 2020-04-12 NOTE — Progress Notes (Signed)
Patient ID: Darrell Cunningham, male   DOB: [birth year], 59 y.o.   MRN: [id number] Patient presents to IR department today for Port-A-Cath removal.  He has a history of esophageal carcinoma diagnosed in 2018, status post chemoradiation as well as surgery.  He has no evidence of disease recurrence on latest imaging.  Details/risks of procedure, including but not limited to, internal bleeding, infection, injury to adjacent structures discussed with patient with his understanding and consent.  Patient does not wish to receive IV conscious sedation for the procedure.

## 2020-05-07 ENCOUNTER — Ambulatory Visit (INDEPENDENT_AMBULATORY_CARE_PROVIDER_SITE_OTHER): Payer: Medicaid Other | Admitting: *Deleted

## 2020-05-07 ENCOUNTER — Other Ambulatory Visit: Payer: Self-pay

## 2020-05-07 DIAGNOSIS — K219 Gastro-esophageal reflux disease without esophagitis: Secondary | ICD-10-CM

## 2020-05-07 DIAGNOSIS — K222 Esophageal obstruction: Secondary | ICD-10-CM

## 2020-05-07 DIAGNOSIS — Z23 Encounter for immunization: Secondary | ICD-10-CM | POA: Diagnosis not present

## 2020-05-07 MED ORDER — PANTOPRAZOLE SODIUM 40 MG PO TBEC: 40.0000 mg | DELAYED_RELEASE_TABLET | Freq: Two times a day (BID) | ORAL | 0 refills | Status: DC

## 2020-05-11 ENCOUNTER — Other Ambulatory Visit: Payer: Self-pay

## 2020-05-11 ENCOUNTER — Ambulatory Visit: Payer: Medicaid Other | Admitting: Family Medicine

## 2020-05-11 VITALS — BP 120/90 | HR 84 | Temp 97.9°F | Ht 61.0 in | Wt 132.0 lb

## 2020-05-11 DIAGNOSIS — C155 Malignant neoplasm of lower third of esophagus: Secondary | ICD-10-CM | POA: Diagnosis not present

## 2020-05-11 DIAGNOSIS — Z23 Encounter for immunization: Secondary | ICD-10-CM

## 2020-05-11 DIAGNOSIS — Z125 Encounter for screening for malignant neoplasm of prostate: Secondary | ICD-10-CM

## 2020-05-11 DIAGNOSIS — Z13220 Encounter for screening for lipoid disorders: Secondary | ICD-10-CM | POA: Diagnosis not present

## 2020-05-11 NOTE — Progress Notes (Signed)
Subjective:    Patient ID: Darrell Cunningham, male    DOB: 31-Jan-1961, 59 y.o.   MRN: [id number]  Patient has a history of esophageal adenocarcinoma, cT3N1M0, stage III, ypT0N0.  I have copied portions of his last oncology note below for my reference:   -He was diagnosed in 09/2016. He is s/p neoadjuvant ChemoRT andesophagectomy.he achieved complete pathological response to neoadjuvant therapy  -His risk of recurrence is low.I recommended cancer surveillance for 5 years. -He has had multiple EGD for dilatation. His last EGD in 03/2019 for obstruction, no evidence of local recurrence on EGD. -I personally reviewed and discussed his CT CAP from 03/09/20 with pt which showed NED. He has aortic Atherosclerosis.  -He is clinically stable. Labs reviewed, except BG 102, albumin 3.4. There is no clinical concern for recurrence.  -He is ready to have PAC removed by IR.  -He is almost 3.5 years since cancer diagnosis. His risk of recurrence has significantly decreased. Continue Surveillance.  -f/u in 9 months. Continue to f/u with Dr. Pia Mau in the interim.   05/11/20 Patient had a normal cmp and cbc in august.  Patient has done remarkably well and has been in remission now for almost 3-1/2 years.  Current plans are to have an EGD annually.  He just had his EGD and colonoscopy in August of this year.  He will see Dr. [name] next year.  He will also have a CAT scan ordered by his oncologist annually.  He has an appointment to see her in 6 months.  However at the present time he is in remission and seems to be doing well.  He is due to have his cholesterol checked.  He is due to be screened for prostate cancer.  He is also due for Pneumovax 23.  He has a history of smoking with mild emphysema.  He is wheezing today on exam.  He uses albuterol every couple days for breakthrough wheezing.  He is on Flovent.  We discussed switching to Symbicort but at the present time he does not see any substantial negative  impact on his quality of life and therefore he is not interested in switching.   Past Medical History:  Diagnosis Date  . Allergy   . Arthritis   . Asthma    as a child  . Cancer ([place]) 10/09/2016   ESOPHAGUS CARCINOMA   . COPD (chronic obstructive pulmonary disease) ([place])   . 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 Dysphagia   . Dyspnea    with exersion  . Emphysema of lung ([place])   . Encounter for nonprocreative genetic counseling 10/31/2016   Mr. Farinas underwent genetic counseling for hereditary cancer syndromes on 10/31/2016. Though he is a candidate for genetic testing, he declines at this time.  Marland Kitchen GERD (gastroesophageal reflux disease)   . History of chemotherapy    03-2017  . History of radiation therapy    03-2017  . Hypertension   . Neuromuscular disorder ([place])    [name]  . Pneumonia 4 yrs ago   Past Surgical History:  Procedure Laterality Date  . COLONOSCOPY    . COMPLETE ESOPHAGECTOMY N/A 04/16/2017   Procedure: ESOPHAGECTOMY COMPLETE,Transhiatal total esophagectomy;  Surgeon: [name], MD;  Location: [facility] OR;  Service: Thoracic;  Laterality: N/A;  . ESOPHAGOGASTRODUODENOSCOPY (EGD) WITH PROPOFOL N/A 03/30/2019   Procedure: ESOPHAGOGASTRODUODENOSCOPY (EGD) WITH PROPOFOL;  Surgeon: Ladene Artist, MD;  Location: [facility] ENDOSCOPY;  Service: Endoscopy;  Laterality: N/A;  .  EUS N/A 10/26/2016   Procedure: UPPER ENDOSCOPIC ULTRASOUND (EUS) LINEAR;  Surgeon: [name], MD;  Location: [facility] ENDOSCOPY;  Service: Endoscopy;  Laterality: N/A;  . EYE SURGERY Bilateral age 80    for cross eyes  . FOREIGN BODY REMOVAL  03/30/2019   Procedure: FOREIGN BODY REMOVAL;  Surgeon: Ladene Artist, MD;  Location: [facility];  Service: Endoscopy;;  . IR FLUORO GUIDE PORT INSERTION RIGHT  11/08/2016   right upper chest  . IR REMOVAL TUN ACCESS W/ PORT W/O FL MOD SED  04/12/2020  . IR US GUIDE VASC ACCESS RIGHT  11/08/2016  . JEJUNOSTOMY N/A 04/16/2017   Procedure:  Donney Rankins;  Surgeon: [name], MD;  Location: [facility] OR;  Service: Thoracic;  Laterality: N/A;  . JEJUNOSTOMY     removal of feeding tube /pt unsure of date  . MOUTH SURGERY    . POLYPECTOMY    . UPPER GASTROINTESTINAL ENDOSCOPY    . VIDEO BRONCHOSCOPY N/A 04/16/2017   Procedure: VIDEO BRONCHOSCOPY, Transhiatal Total Esophagectomy, Esophagogastrostomy, pyloromotomy, Feeding Jejunostomy;  Surgeon: [name], MD;  Location: [facility] OR;  Service: Thoracic;  Laterality: N/A;   Current Outpatient Medications on File Prior to Visit  Medication Sig Dispense Refill  . acetaminophen (TYLENOL) 160 MG/5ML solution Take 10.2 mLs (325 mg total) by mouth every 4 (four) hours as needed for moderate pain. 120 mL 0  . acetaminophen (TYLENOL) 500 MG tablet Take 500 mg by mouth every 6 (six) hours as needed.    Marland Kitchen albuterol (ACCUNEB) 0.63 MG/3ML nebulizer solution Take 1 ampule by nebulization every 6 (six) hours as needed for wheezing.    . cyclobenzaprine (FLEXERIL) 5 MG tablet Take 1 tablet (5 mg total) by mouth 3 (three) times daily as needed for muscle spasms. 30 tablet 0  . fluticasone (FLONASE) 50 MCG/ACT nasal spray Place 1 spray into both nostrils daily as needed for allergies or rhinitis.    . fluticasone (FLOVENT DISKUS) 50 MCG/BLIST diskus inhaler Inhale into the lungs.    . pantoprazole (PROTONIX) 40 MG tablet Take 1 tablet (40 mg total) by mouth 2 (two) times daily. 60 tablet 0   No current facility-administered medications on file prior to visit.   Allergies  Allergen Reactions  . Penicillins Hives, Itching and Swelling    Has patient had a PCN reaction causing immediate rash, facial/tongue/throat swelling, SOB or lightheadedness with hypotension: No Has patient had a PCN reaction causing severe rash involving mucus membranes or skin necrosis: No Has patient had a PCN reaction that required hospitalization No Has patient had a PCN reaction occurring within the last 10 years:  No If all of the above answers are "NO", then may proceed with Cephalosporin use. Unknown Childhood reaction   Social History   Socioeconomic History  . Marital status: Legally Separated    Spouse name: Not on file  . Number of children: Not on file  . Years of education: Not on file  . Highest education level: Not on file  Occupational History  . Not on file  Tobacco Use  . Smoking status: Former Smoker    Packs/day: 1.00    Years: 38.00    Pack years: 38.00    Types: Cigarettes    Start date: 10/31/2012    Quit date: 10/31/2012    Years since quitting: 7.5  . Smokeless tobacco: Never Used  Vaping Use  . Vaping Use: Never used  Substance and Sexual Activity  . Alcohol use: No  Alcohol/week: 0.0 standard drinks  . Drug use: No  . Sexual activity: Not on file  Other Topics Concern  . Not on file  Social History Narrative  . Not on file   Social Determinants of Health   Financial Resource Strain:   . Difficulty of Paying Living Expenses: Not on file  Food Insecurity:   . Worried About Charity fundraiser in the Last Year: Not on file  . Ran Out of Food in the Last Year: Not on file  Transportation Needs:   . Lack of Transportation (Medical): Not on file  . Lack of Transportation (Non-Medical): Not on file  Physical Activity:   . Days of Exercise per Week: Not on file  . Minutes of Exercise per Session: Not on file  Stress:   . Feeling of Stress : Not on file  Social Connections:   . Frequency of Communication with Friends and Family: Not on file  . Frequency of Social Gatherings with Friends and Family: Not on file  . Attends Religious Services: Not on file  . Active Member of Clubs or Organizations: Not on file  . Attends Archivist Meetings: Not on file  . Marital Status: Not on file  Intimate Partner Violence:   . Fear of Current or Ex-Partner: Not on file  . Emotionally Abused: Not on file  . Physically Abused: Not on file  . Sexually Abused:  Not on file     Review of Systems  All other systems reviewed and are negative.      Objective:   Physical Exam Vitals reviewed.  Cardiovascular:     Rate and Rhythm: Normal rate and regular rhythm.     Pulses: Normal pulses.     Heart sounds: Normal heart sounds. No murmur heard.  No friction rub. No gallop.   Pulmonary:     Effort: Pulmonary effort is normal. No respiratory distress.     Breath sounds: No stridor. Wheezing present. No rales.  Abdominal:     General: Bowel sounds are normal. There is no distension.     Palpations: Abdomen is soft.     Tenderness: There is no abdominal tenderness. There is no guarding or rebound.  Musculoskeletal:     Cervical back: Neck supple.     Right lower leg: No edema.     Left lower leg: No edema.  Lymphadenopathy:     Cervical: No cervical adenopathy.           Assessment & Plan:  Malignant neoplasm of lower third of esophagus (HCC)  Screening cholesterol level - Plan: Lipid panel  Prostate cancer screening - Plan: PSA  Immunization due - Plan: Pneumococcal polysaccharide vaccine 23-valent greater than or equal to 2yo subcutaneous/IM  Blood pressure today is excellent.  Patient received Pneumovax 23.  The remainder of his immunizations are up-to-date.  I will complete his preventative care by checking a fasting lipid panel to screen for hyperlipidemia.  I will also screen a PSA to check for prostate cancer.  Otherwise begin annual follow-up with a normal physical exam.  Congratulated the patient on his recovery.  We did discuss switching to Symbicort from Flovent but at the present time he politely declines

## 2020-05-12 LAB — LIPID PANEL
Cholesterol: 224 mg/dL — ABNORMAL HIGH (ref <200)
HDL: 50 mg/dL (ref >=40)
LDL Cholesterol (Calc): 152 mg/dL (calc) — ABNORMAL HIGH
Non-HDL Cholesterol (Calc): 174 mg/dL (calc) — ABNORMAL HIGH (ref <130)
Total CHOL/HDL Ratio: 4.5 (calc) (ref <5.0)
Triglycerides: 107 mg/dL (ref <150)

## 2020-05-12 LAB — PSA: PSA: 0.43 ng/mL (ref <=4.0)

## 2020-05-13 ENCOUNTER — Other Ambulatory Visit: Payer: Self-pay

## 2020-05-13 DIAGNOSIS — E7800 Pure hypercholesterolemia, unspecified: Secondary | ICD-10-CM

## 2020-05-13 MED ORDER — ATORVASTATIN CALCIUM 20 MG PO TABS: 20.0000 mg | ORAL_TABLET | Freq: Every day | ORAL | 3 refills | Status: DC

## 2020-05-24 ENCOUNTER — Telehealth: Payer: Self-pay | Admitting: Family Medicine

## 2020-05-24 NOTE — Telephone Encounter (Signed)
10% of people report muscle aches. Otherwise, well tolerated.

## 2020-05-24 NOTE — Telephone Encounter (Signed)
Was prescribe Atorvastatin 20 mg by Dr.Pickard would like to know the side effects [phone]

## 2020-05-24 NOTE — Telephone Encounter (Signed)
Please advise 

## 2020-09-07 ENCOUNTER — Other Ambulatory Visit: Payer: Self-pay | Admitting: Family Medicine

## 2020-09-07 DIAGNOSIS — K219 Gastro-esophageal reflux disease without esophagitis: Secondary | ICD-10-CM

## 2020-11-02 ENCOUNTER — Other Ambulatory Visit: Payer: Self-pay | Admitting: Family Medicine

## 2020-11-02 DIAGNOSIS — K219 Gastro-esophageal reflux disease without esophagitis: Secondary | ICD-10-CM

## 2020-11-22 ENCOUNTER — Telehealth (INDEPENDENT_AMBULATORY_CARE_PROVIDER_SITE_OTHER): Payer: Medicaid Other | Admitting: Nurse Practitioner

## 2020-11-22 ENCOUNTER — Encounter: Payer: Self-pay | Admitting: Nurse Practitioner

## 2020-11-22 ENCOUNTER — Other Ambulatory Visit: Payer: Self-pay

## 2020-11-22 DIAGNOSIS — J441 Chronic obstructive pulmonary disease with (acute) exacerbation: Secondary | ICD-10-CM | POA: Diagnosis not present

## 2020-11-22 DIAGNOSIS — J301 Allergic rhinitis due to pollen: Secondary | ICD-10-CM

## 2020-11-22 MED ORDER — FLUTICASONE PROPIONATE 50 MCG/ACT NA SUSP: 1.0000 | Freq: Every day | NASAL | 2 refills | Status: DC | PRN

## 2020-11-22 MED ORDER — PREDNISONE 10 MG PO TABS: 20.0000 mg | ORAL_TABLET | Freq: Every day | ORAL | 0 refills | Status: AC

## 2020-11-22 MED ORDER — ALBUTEROL SULFATE HFA 108 (90 BASE) MCG/ACT IN AERS: 2.0000 | INHALATION_SPRAY | Freq: Four times a day (QID) | RESPIRATORY_TRACT | 0 refills | Status: DC | PRN

## 2020-11-22 MED ORDER — AZITHROMYCIN 250 MG PO TABS: ORAL_TABLET | ORAL | 0 refills | Status: AC
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
## 2020-11-25 LAB — SARS-COV-2 RNA (COVID-19) RESP VIRAL PNL QL NAAT

## 2020-12-10 NOTE — Progress Notes (Signed)
[place]   [phone] [phone]   Clinic Follow up Note   Patient Care Team: [name], MD as PCP - General (Family Medicine) [name], MD (Inactive) as Consulting Physician (Cardiothoracic Surgery)  Date of Service:  12/15/2020  CHIEF COMPLAINT: Follow up esophageal adenocarcinoma  SUMMARY OF ONCOLOGIC HISTORY: Oncology History Overview Note  Cancer Staging Esophageal cancer [facility]) Staging form: Esophagus - Adenocarcinoma, AJCC 8th Edition - Clinical stage from 10/26/2016: Stage III (cT3, cN1, cM0) - Signed by [name], MD on 11/05/2016     Malignant neoplasm of lower third of esophagus ([place])  09/21/2016 - 09/21/2016 Hospital Admission   esophageal pain and vomiting up blood   10/09/2016 Initial Diagnosis   Esophageal cancer ([place])   10/09/2016 Procedure   EGD 1. Partially obstructing, likely malignant esophageal tumor was found in the lower third of the esophagus. Multiple biopsies.  2. Mass visible during gastric retroflexion at GE junction 3. Otherwise normal stomach 4. Normal examined duodenum    10/09/2016 Pathology Results   Esophagus, biopsy, distal esophageal tumor (33-39) - SUSPICIOUS FOR ADENOCARCINOMA   10/12/2016 Imaging   CT CAP w Contrast IMPRESSION: Distal esophageal mass compatible with primary esophageal malignancy. There are 2 adjacent abnormal appearing subcentimeter paraesophageal lymph nodes which may represent nodal metastasis. Additionally there is a prominent nonspecific 8 mm upper abdominal lymph node.  No evidence for distant metastatic disease in the chest, abdomen or pelvis.   10/24/2016 PET scan   1. Markedly hypermetabolic distal esophageal lesion, compatible with malignancy. Adjacent small paraesophageal lymph nodes are abnormal by CT but cannot be resolved as separate structures from the hypermetabolic esophageal activity on the PET images. No hypermetabolism is demonstrated in  the upper abdominal/gastrohepatic ligament lymph node although the small size of this lymph node may be below threshold for detection on PET imaging. 2. No evidence for distant hypermetabolic metastatic disease in the neck, chest, abdomen, or pelvis.   10/26/2016 Pathology Results   Esophagogastric junction, biopsy, mass - INVASIVE ADENOCARCINOMA.   10/26/2016 Procedure   EUS showed uT3N1 disease, and biopsy confirmed adenocarcinoma    10/30/2016 - 12/07/2016 Radiation Therapy   Neoadjuvant radiation to esophageal cancer  Under the care of Dr. [name]   10/31/2016 - 12/07/2016 Neo-Adjuvant Chemotherapy   Weekly Carboplatin AUC 2 and taxol 50mg /m2 with concurrent radiation     12/14/2016 Imaging   CT AP W Contrast 12/14/16 IMPRESSION: 1. Mildly dilated short segment of proximal jejunum without bowel wall thickening or significant inflammatory changes. Findings may represent focal ileus or radiation enteritis. No evidence for obstruction. 2. Significantly improved appearance of the distal esophagus/proximal stomach with decreased wall thickening (1.1 cm from 2.3 cm), and smaller paraesophageal and gastrohepatic ligament lymph nodes. 3. No bowel obstruction.  Normal appendix.   01/18/2017 Imaging   CT CAP W Contrast 01/18/17 IMPRESSION: 1. Mild stable distal esophageal wall thickening likely due to radiation change. No findings for recurrent tumor. Small paraesophageal lymph nodes are also stable. 2. No findings for metastatic disease. 3. Stable mild/early emphysematous changes and age advanced atherosclerotic calcifications involving the thoracic and abdominal aorta and branch vessels.   04/12/2017 Imaging   CT Chest and Abdomen W Contrast 04/12/17  IMPRESSION: 1. Mild stable distal esophageal wall thickening. No findings for recurrent tumor. 2. Stable small mediastinal and left supraclavicular lymph nodes. 3. There is a tiny nodule within the medial right lower lobe measuring 3 mm.  New from previous exam. Nonspecific. Attention on  follow-up imaging advise. 4.  Aortic Atherosclerosis (ICD10-I70.0). 5. Three vessel coronary artery calcification   04/16/2017 Surgery   VIDEO BRONCHOSCOPY, Transhiatal Total Esophagectomy, Esophagogastrostomy, pyloromotomy, Feeding Jejunostomy ESOPHAGECTOMY COMPLETE,Transhiatal total esophagectomy JEJUNOSTOMY,Feeding by Dr. [name] 04/16/17   04/16/2017 Pathology Results   Diagnosis 04/16/17  1. Omentum, resection for tumor - BENIGN ADIPOSE TISSUE CONSISTENT WITH OMENTUM. - NO EVIDENCE OF MALIGNANCY. 2. Esophagus, resection, w/ GE junction - FIBROSIS WITH PATCHY CHRONIC INFLAMMATION. - NO RESIDUAL CARCINOMA IDENTIFIED. - MARGINS NOT INVOLVED. - TWELVE LYMPH NODES WITH NO METASTATIC CARCINOMA (0/12).    07/16/2017 Procedure   Upper GI Endoscopy Findings: per Dr. [name] - Food was found in the upper third of the esophagus. Removal of food was accomplished with Jabier Mutton net. - A partial esophagectomy anastomosis was found in the upper third of the esophagus (20 cm from incisors). This was characterized by severe stenosis, an intact staple line and visible sutures. The standard adult upper endoscope would not pass before dilation. A TTS dilator was passed through the scope. Dilation with an 8.5-9.5-10.5 mm and then 06-11-12 mm balloon dilator was performed to 12 mm (inspection after 9.5 mm, 10.5 mm, 11 mm and 12 mm). The dilation site was examined and showed moderate and significant improvement in luminal narrowing. Estimated blood loss was minimal. After dilation to 12 mm the endoscope was able to transverse the anastomosis with minimal pressure. - A medium amount of food (residue) was found in the gastric body. - The exam of the stomach was otherwise normal. - The examined duodenum was normal.   08/13/2017 Procedure   Upper GI Endoscopy Findings: per Dr. [name] - Food was found in the proximal esophagus. Removal of food was accomplished with  Jabier Mutton net. - One severe (stenosis; an endoscope cannot pass) benign-appearing, intrinsic stenosis was found 20 cm from the incisors. This measured 1 cm (in length) and was traversed after dilation. A TTS dilator was passed through the scope. Dilation with an 06-11-12 mm balloon dilator was performed to 13 mm (after 57mm and 12 mm). The dilation site was examined and showed moderate improvement in luminal narrowing. At the stricture there is a visible staple on the proximal side and visible suture material on the gastric side. - A medium amount of food (residue) -the duodenum was normal    08/31/2017 Procedure   Endoscopic needle-knife excision of anastomotic stricture by Dr. Lysle Rubens at [facility]   09/11/2017 Imaging   CT CAP W CONTRAST IMPRESSION: 1. Interval esophagectomy with gastric pull-through. No demonstrated complication. There is retained ingested material within the intrathoracic portion of the stomach. 2. No evidence of local recurrence or metastatic disease. 3. Stable small left supraclavicular and superior mediastinal lymph nodes. 4. Mild lower lobe paramediastinal pulmonary opacity bilaterally, likely atelectasis or sequela of prior radiation therapy. 5.  Aortic Atherosclerosis (ICD10-I70.0).    02/28/2018 Procedure   02/28/2018 Upper endoscopy Impression - Benign-appearing esophageal stenosis. Dilated. - A previous surgical anastomosis was found in the proximal stomach. - Normal examined duodenum. - No specimens collected.   04/10/2018 Imaging   04/10/2018 CT CA IMPRESSION: 1. No evidence of metastatic disease. 2. Aortic atherosclerosis (ICD10-170.0). Coronary artery calcification. 3.  Emphysema (ICD10-J43.9).   03/20/2019 Imaging   CT CAP W contrast 03/20/19  IMPRESSION: 1. Stable exam. No new or progressive interval findings to suggest recurrent/metastatic disease. 2.  Aortic Atherosclerois (ICD10-170.0)   03/30/2019 Procedure   EGD with Dr Fuller Plan 03/30/19  IMPRESSION - Food  impaction in the proximal esophagus. Removal was  successful. 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 - Ectopic gastric mucosa in the proximal esophagus. - Prior esophagectomy, gastric pull up. Sutures noted in gastric body. - A medium amount of food (residue) in the stomach that obscured visualization. - Normal duodenal bulb and second portion of the duodenum.   05/26/2019 Procedure   Upper Endoscopy by Dr. [name] 05/26/19  IMPRESSION - Benign-appearing esophageal stenosis at anastomosis. Dilated to 19 mm with balloon. Injected with steroid. 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 - Normal examined duodenum. - No specimens collected.   03/09/2020 Imaging   CT CAP w contrast  IMPRESSION: Stable exam. No evidence of recurrent or metastatic carcinoma within the chest, abdomen, or pelvis.   Aortic Atherosclerosis (ICD10-I70.0). Coronary artery atherosclerosis.      CURRENT THERAPY:  Surveillance  INTERVAL HISTORY:  Darrell Cunningham is here for a follow up of esophageal cancer. He was last seen by me 9 months ago. He presents to the clinic alone. He notes he has mild dysphagia and plans to see GI about esophageal stretching. He notes numbness in fingertips only. He notes his memory is "slipping" decreasing. He is able to do everything himself so far. He has lost weight since last visit. He notes intermittent epigastric pain or discomfort.  He notes he quit smoking in 2014 from the age of 83. He notes having cough after GI surgery with phlegm.  REVIEW OF SYSTEMS:   Constitutional: Denies fevers, chills or abnormal weight loss Eyes: Denies blurriness of vision Ears, nose, mouth, throat, and face: Denies mucositis or sore throat Respiratory: Denies cough, dyspnea or wheezes Cardiovascular: Denies palpitation, chest discomfort or lower extremity swelling Gastrointestinal:  Denies nausea, heartburn or change in bowel habits Skin: Denies abnormal skin  rashes Lymphatics: Denies new lymphadenopathy or easy bruising Neurological:Denies numbness, tingling or new weaknesses Behavioral/Psych: Mood is stable, no new changes  All other systems were reviewed with the patient and are negative.  MEDICAL HISTORY:  Past Medical History:  Diagnosis Date  . Allergy   . Arthritis   . Asthma    as a child  . Cancer ([place]) 10/09/2016   ESOPHAGUS CARCINOMA   . COPD (chronic obstructive pulmonary disease) ([place])   . 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 Dysphagia   . Dyspnea    with exersion  . Emphysema of lung ([place])   . Encounter for nonprocreative genetic counseling 10/31/2016   Mr. Nealy underwent genetic counseling for hereditary cancer syndromes on 10/31/2016. Though he is a candidate for genetic testing, he declines at this time.  Marland Kitchen GERD (gastroesophageal reflux disease)   . History of chemotherapy    03-2017  . History of radiation therapy    03-2017  . Hypertension   . Neuromuscular disorder ([place])    [name]  . Pneumonia 4 yrs ago    SURGICAL HISTORY: Past Surgical History:  Procedure Laterality Date  . COLONOSCOPY    . COMPLETE ESOPHAGECTOMY N/A 04/16/2017   Procedure: ESOPHAGECTOMY COMPLETE,Transhiatal total esophagectomy;  Surgeon: [name], MD;  Location: [facility] OR;  Service: Thoracic;  Laterality: N/A;  . ESOPHAGOGASTRODUODENOSCOPY (EGD) WITH PROPOFOL N/A 03/30/2019   Procedure: ESOPHAGOGASTRODUODENOSCOPY (EGD) WITH PROPOFOL;  Surgeon: Ladene Artist, MD;  Location: [facility] ENDOSCOPY;  Service: Endoscopy;  Laterality: N/A;  . EUS N/A 10/26/2016   Procedure: UPPER ENDOSCOPIC ULTRASOUND (EUS) LINEAR;  Surgeon: [name], MD;  Location: [facility] ENDOSCOPY;  Service: Endoscopy;  Laterality: N/A;  . EYE SURGERY  Bilateral age 60    for cross eyes  . FOREIGN BODY REMOVAL  03/30/2019   Procedure: FOREIGN BODY REMOVAL;  Surgeon: Ladene Artist, MD;  Location: [facility];  Service: Endoscopy;;  . IR FLUORO GUIDE PORT  INSERTION RIGHT  11/08/2016   right upper chest  . IR REMOVAL TUN ACCESS W/ PORT W/O FL MOD SED  04/12/2020  . IR US GUIDE VASC ACCESS RIGHT  11/08/2016  . JEJUNOSTOMY N/A 04/16/2017   Procedure: Donney Rankins;  Surgeon: [name], MD;  Location: [facility] OR;  Service: Thoracic;  Laterality: N/A;  . JEJUNOSTOMY     removal of feeding tube /pt unsure of date  . MOUTH SURGERY    . POLYPECTOMY    . UPPER GASTROINTESTINAL ENDOSCOPY    . VIDEO BRONCHOSCOPY N/A 04/16/2017   Procedure: VIDEO BRONCHOSCOPY, Transhiatal Total Esophagectomy, Esophagogastrostomy, pyloromotomy, Feeding Jejunostomy;  Surgeon: [name], MD;  Location: [facility] OR;  Service: Thoracic;  Laterality: N/A;    I have reviewed the social history and family history with the patient and they are unchanged from previous note.  ALLERGIES:  is allergic to penicillins.  MEDICATIONS:  Current Outpatient Medications  Medication Sig Dispense Refill  . acetaminophen (TYLENOL) 500 MG tablet Take 500 mg by mouth every 6 (six) hours as needed.    Marland Kitchen albuterol (VENTOLIN HFA) 108 (90 Base) MCG/ACT inhaler Inhale 2 puffs into the lungs every 6 (six) hours as needed for wheezing or shortness of breath. 8 g 0  . atorvastatin (LIPITOR) 20 MG tablet Take 1 tablet (20 mg total) by mouth daily. (Patient not taking: Reported on 11/22/2020) 90 tablet 3  . cyclobenzaprine (FLEXERIL) 5 MG tablet Take 1 tablet (5 mg total) by mouth 3 (three) times daily as needed for muscle spasms. 30 tablet 0  . fluticasone (FLONASE) 50 MCG/ACT nasal spray Place 1 spray into both nostrils daily as needed for allergies or rhinitis. 16 g 2  . pantoprazole (PROTONIX) 40 MG tablet TAKE 1 TABLET BY MOUTH TWICE A DAY 60 tablet 0   No current facility-administered medications for this visit.    PHYSICAL EXAMINATION: ECOG PERFORMANCE STATUS: 1 - Symptomatic but completely ambulatory  Vitals:   12/15/20 0927  BP: 117/77  Pulse: 79  Resp: 19  Temp: 97.7 F  (36.5 C)  SpO2: 100%   Filed Weights   12/15/20 0927  Weight: 124 lb 4.8 oz (56.4 kg)    GENERAL:alert, no distress and comfortable SKIN: skin color, texture, turgor are normal, no rashes or significant lesions EYES: normal, Conjunctiva are pink and non-injected, sclera clear  NECK: supple, thyroid normal size, non-tender, without nodularity LYMPH:  no palpable lymphadenopathy in the cervical, axillary  LUNGS: clear to auscultation and percussion with normal breathing effort HEART: regular rate & rhythm and no murmurs and no lower extremity edema ABDOMEN:abdomen soft, non-tender and normal bowel sounds (+) Surgical incision healed well  Musculoskeletal:no cyanosis of digits and no clubbing (+) Mildly prominent sternal bone NEURO: alert & oriented x 3 with fluent speech, no focal motor/sensory deficits  LABORATORY DATA:  I have reviewed the data as listed CBC Latest Ref Rng & Units 12/15/2020 04/12/2020 03/09/2020  WBC 4.0 - 10.5 K/uL 5.0 7.0 6.6  Hemoglobin 13.0 - 17.0 g/dL 13.4 14.4 13.8  Hematocrit 39.0 - 52.0 % 40.7 45.5 41.4  Platelets 150 - 400 K/uL 214 175 191     CMP Latest Ref Rng & Units 12/15/2020 03/09/2020 06/13/2019  Glucose 70 - 99 mg/dL  103(H) 102(H) 98  BUN 8 - 23 mg/dL 17 16 13   Creatinine 0.61 - 1.24 mg/dL 0.77 0.77 0.78  Sodium 135 - 145 mmol/L 140 138 139  Potassium 3.5 - 5.1 mmol/L 3.7 4.1 4.3  Chloride 98 - 111 mmol/L 104 106 104  CO2 22 - 32 mmol/L 28 24 25   Calcium 8.9 - 10.3 mg/dL 9.1 9.6 9.5  Total Protein 6.5 - 8.1 g/dL 7.1 7.3 7.8  Total Bilirubin 0.3 - 1.2 mg/dL 0.5 0.3 0.5  Alkaline Phos 38 - 126 U/L 85 78 84  AST 15 - 41 U/L 17 14(L) 17  ALT 0 - 44 U/L 15 12 15       RADIOGRAPHIC STUDIES: I have personally reviewed the radiological images as listed and agreed with the findings in the report. No results found.   ASSESSMENT & PLAN:  BRAYANT SOBH is a 60 y.o. male with    1. Low esophageal Adenocarcinoma, cT3N1M0, stage III,  ypT0N0 -He was diagnosed in 09/2016. He is s/p neoadjuvant ChemoRT andesophagectomy.he achieved complete pathological response to neoadjuvant therapy  -His risk of recurrence is low.I recommended cancer surveillance for 5 years. -He has had multiple EGD for dilatation. His last EGD with Dr [name] in 02/2020 was benign Last CT CAP in 02/2020 was NED.  -He is clinically doing well. Labs reviewed, CBC and CMP WNL. Physical exam unremarkable today. There is no clinical concern for recurrence.  -He is 4 years since his cancer diagnosis. His risk of recurrence has significantly decreased. Continue Surveillance. I do not plan to repeat surveillance CT scan. -f/u in one year    2. Malnutrition and weight loss, Esophageal stricture  -His nutrition and eating has much improved. He continues to try not to over eat which can lead to vomiting.  -He has hadmultipleesophageal stretch. He had esophageal obstruction due to food impaction in 03/2019. Last EGD in 02/2020. He will continue to f/u with Dr [name] as needed for Esophageal stretching. He may proceed soon.  -I encouraged him to continue nutritional supplement -He does continue to lose weight.  -S/p treatment and surgery he still has productive cough and will spit out phlegm as needed.    3. History of heavy smoking -He has quit smoking completely in 2014, history of 40 pack year since age 64.    Plan: -Lab and F/u in one year -He will call Dr. Blanch Media office to set up EGD and esophageal dilatation.   No problem-specific Assessment & Plan notes found for this encounter.   Orders Placed This Encounter  Procedures  . [facility] (IN HOUSE-[facility])   All questions were answered. The patient knows to call the clinic with any problems, questions or concerns. No barriers to learning was detected. The total time spent in the appointment was 25 minutes.     [name], MD 12/15/2020   I, Joslyn Devon, am acting as scribe for [name], MD.   I have  reviewed the above documentation for accuracy and completeness, and I agree with the above.

## 2020-12-15 ENCOUNTER — Inpatient Hospital Stay: Payer: Medicaid Other | Attending: Hematology

## 2020-12-15 ENCOUNTER — Other Ambulatory Visit: Payer: Self-pay

## 2020-12-15 ENCOUNTER — Inpatient Hospital Stay (HOSPITAL_BASED_OUTPATIENT_CLINIC_OR_DEPARTMENT_OTHER): Payer: Medicaid Other | Admitting: Hematology

## 2020-12-15 ENCOUNTER — Encounter: Payer: Self-pay | Admitting: Hematology

## 2020-12-15 VITALS — BP 117/77 | HR 79 | Temp 97.7°F | Resp 19 | Wt 124.3 lb

## 2020-12-15 DIAGNOSIS — Z9049 Acquired absence of other specified parts of digestive tract: Secondary | ICD-10-CM | POA: Diagnosis not present

## 2020-12-15 DIAGNOSIS — C155 Malignant neoplasm of lower third of esophagus: Secondary | ICD-10-CM | POA: Diagnosis not present

## 2020-12-15 DIAGNOSIS — K31819 Angiodysplasia of stomach and duodenum without bleeding: Secondary | ICD-10-CM | POA: Diagnosis not present

## 2020-12-15 DIAGNOSIS — R1013 Epigastric pain: Secondary | ICD-10-CM | POA: Diagnosis not present

## 2020-12-15 DIAGNOSIS — I2510 Atherosclerotic heart disease of native coronary artery without angina pectoris: Secondary | ICD-10-CM | POA: Insufficient documentation

## 2020-12-15 DIAGNOSIS — Z79899 Other long term (current) drug therapy: Secondary | ICD-10-CM | POA: Diagnosis not present

## 2020-12-15 DIAGNOSIS — Z87891 Personal history of nicotine dependence: Secondary | ICD-10-CM | POA: Insufficient documentation

## 2020-12-15 DIAGNOSIS — K222 Esophageal obstruction: Secondary | ICD-10-CM | POA: Insufficient documentation

## 2020-12-15 DIAGNOSIS — Z880 Allergy status to penicillin: Secondary | ICD-10-CM | POA: Diagnosis not present

## 2020-12-15 DIAGNOSIS — R200 Anesthesia of skin: Secondary | ICD-10-CM | POA: Insufficient documentation

## 2020-12-15 DIAGNOSIS — I700 Atherosclerosis of aorta: Secondary | ICD-10-CM | POA: Insufficient documentation

## 2020-12-15 DIAGNOSIS — E46 Unspecified protein-calorie malnutrition: Secondary | ICD-10-CM | POA: Diagnosis not present

## 2020-12-15 DIAGNOSIS — J439 Emphysema, unspecified: Secondary | ICD-10-CM | POA: Diagnosis not present

## 2020-12-15 LAB — COMPREHENSIVE METABOLIC PANEL
ALT: 15 U/L (ref 0–44)
AST: 17 U/L (ref 15–41)
Albumin: 3.3 g/dL — ABNORMAL LOW (ref 3.5–5.0)
Alkaline Phosphatase: 85 U/L (ref 38–126)
Anion gap: 8 (ref 5–15)
BUN: 17 mg/dL (ref 8–23)
CO2: 28 mmol/L (ref 22–32)
Calcium: 9.1 mg/dL (ref 8.9–10.3)
Chloride: 104 mmol/L (ref 98–111)
Creatinine, Ser: 0.77 mg/dL (ref 0.61–1.24)
GFR, Estimated: >60 mL/min (ref >60)
Glucose, Bld: 103 mg/dL — ABNORMAL HIGH (ref 70–99)
Potassium: 3.7 mmol/L (ref 3.5–5.1)
Sodium: 140 mmol/L (ref 135–145)
Total Bilirubin: 0.5 mg/dL (ref 0.3–1.2)
Total Protein: 7.1 g/dL (ref 6.5–8.1)

## 2020-12-15 LAB — CBC WITH DIFFERENTIAL/PLATELET
Abs Immature Granulocytes: 0.01 10*3/uL (ref 0.00–0.07)
Basophils Absolute: 0 10*3/uL (ref 0.0–0.1)
Basophils Relative: 1 %
Eosinophils Absolute: 0.3 10*3/uL (ref 0.0–0.5)
Eosinophils Relative: 6 %
HCT: 40.7 % (ref 39.0–52.0)
Hemoglobin: 13.4 g/dL (ref 13.0–17.0)
Immature Granulocytes: 0 %
Lymphocytes Relative: 31 %
Lymphs Abs: 1.6 10*3/uL (ref 0.7–4.0)
MCH: 31 pg (ref 26.0–34.0)
MCHC: 32.9 g/dL (ref 30.0–36.0)
MCV: 94.2 fL (ref 80.0–100.0)
Monocytes Absolute: 0.5 10*3/uL (ref 0.1–1.0)
Monocytes Relative: 10 %
Neutro Abs: 2.7 10*3/uL (ref 1.7–7.7)
Neutrophils Relative %: 52 %
Platelets: 214 10*3/uL (ref 150–400)
RBC: 4.32 MIL/uL (ref 4.22–5.81)
RDW: 13 % (ref 11.5–15.5)
WBC: 5 10*3/uL (ref 4.0–10.5)
nRBC: 0 % (ref 0.0–0.2)

## 2020-12-15 LAB — CEA (IN HOUSE-CHCC): CEA (CHCC-In House): 1.47 ng/mL (ref 0.00–5.00)

## 2021-01-03 ENCOUNTER — Other Ambulatory Visit: Payer: Self-pay | Admitting: Family Medicine

## 2021-01-03 DIAGNOSIS — K219 Gastro-esophageal reflux disease without esophagitis: Secondary | ICD-10-CM

## 2021-01-03 DIAGNOSIS — K222 Esophageal obstruction: Secondary | ICD-10-CM

## 2021-01-04 ENCOUNTER — Other Ambulatory Visit: Payer: Self-pay

## 2021-01-04 DIAGNOSIS — J441 Chronic obstructive pulmonary disease with (acute) exacerbation: Secondary | ICD-10-CM

## 2021-01-04 MED ORDER — ALBUTEROL SULFATE HFA 108 (90 BASE) MCG/ACT IN AERS: 2.0000 | INHALATION_SPRAY | Freq: Four times a day (QID) | RESPIRATORY_TRACT | 0 refills | Status: DC | PRN

## 2021-01-07 ENCOUNTER — Telehealth: Payer: Self-pay | Admitting: *Deleted

## 2021-01-07 NOTE — Telephone Encounter (Signed)
Agree with plan 

## 2021-01-07 NOTE — Telephone Encounter (Signed)
Received call from patient.   Reports that he has productive cough with green mucus, fever, and nasal congestion. States that he has known COPD, but is not having much SOB.   States that he has not been tested for COVID in months.   Advised to go to UC for COVID testing and evaluation.

## 2021-01-11 ENCOUNTER — Other Ambulatory Visit: Payer: Self-pay

## 2021-01-11 ENCOUNTER — Ambulatory Visit (INDEPENDENT_AMBULATORY_CARE_PROVIDER_SITE_OTHER): Payer: Medicaid Other | Admitting: Family Medicine

## 2021-01-11 ENCOUNTER — Encounter: Payer: Self-pay | Admitting: Family Medicine

## 2021-01-11 VITALS — BP 122/66 | HR 92 | Temp 97.2°F | Ht 61.0 in | Wt 121.0 lb

## 2021-01-11 DIAGNOSIS — Z125 Encounter for screening for malignant neoplasm of prostate: Secondary | ICD-10-CM

## 2021-01-11 DIAGNOSIS — Z0001 Encounter for general adult medical examination with abnormal findings: Secondary | ICD-10-CM

## 2021-01-11 DIAGNOSIS — J439 Emphysema, unspecified: Secondary | ICD-10-CM | POA: Diagnosis not present

## 2021-01-11 DIAGNOSIS — R1319 Other dysphagia: Secondary | ICD-10-CM | POA: Diagnosis not present

## 2021-01-11 DIAGNOSIS — Z0000 Encounter for general adult medical examination without abnormal findings: Secondary | ICD-10-CM

## 2021-01-11 DIAGNOSIS — C155 Malignant neoplasm of lower third of esophagus: Secondary | ICD-10-CM

## 2021-01-11 DIAGNOSIS — E7800 Pure hypercholesterolemia, unspecified: Secondary | ICD-10-CM | POA: Diagnosis not present

## 2021-01-11 MED ORDER — ROPINIROLE HCL 1 MG PO TABS: 1.0000 mg | ORAL_TABLET | Freq: Every day | ORAL | 5 refills | Status: DC

## 2021-01-11 NOTE — Progress Notes (Addendum)
Subjective:    Patient ID: Darrell Cunningham, male    DOB: 07-24-1961, 60 y.o.   MRN: [id number]  Patient has a history of esophageal adenocarcinoma, cT3N1M0, stage III, ypT0N0.  I have copied portions of his last oncology note below for my reference:   -He was diagnosed in 09/2016. He is s/p neoadjuvant ChemoRT and esophagectomy. he achieved complete pathological response to neoadjuvant therapy  -His risk of recurrence is low. I recommended cancer surveillance for 5 years. -He has had multiple EGD for dilatation. His last EGD in 03/2019 for obstruction, no evidence of local recurrence on EGD.  -I personally reviewed and discussed his CT CAP from 03/09/20 with pt which showed NED. He has aortic Atherosclerosis.  -He is clinically stable. Labs reviewed, except BG 102, albumin 3.4. There is no clinical concern for recurrence.  -He is ready to have PAC removed by IR.  -He is almost 3.5 years since cancer diagnosis. His risk of recurrence has significantly decreased. Continue Surveillance.  -f/u in 9 months. Continue to f/u with Dr. Pia Mau in the interim.   01/12/20 Patient is here today for complete physical exam.  He met with his oncologist and his CEA level was stable in May.  They also checked a CBC and a CMP which was normal.  His last colonoscopy and EGD was performed in August 2021.  He is due for his next colonoscopy in 7 years.  He is requesting a referral back to see Dr. [name] as he is again experiencing symptoms consistent with a stricture.  He reports that almost every morning his food will get stuck when he swallows.  At least once a week he has to regurgitate food because it becomes stuck in his upper esophagus.  He would like to see Dr. [name] for an EGD and dilatation if appropriate.  He is due for a PSA.  He is also due to recheck his cholesterol.  He reports wheezing and coughing more recently.  On exam today he has diminished breath sounds all throughout and faint expiratory  wheezing.  He is not smoking. 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 Faucett underwent genetic counseling for hereditary cancer syndromes on 10/31/2016. Though he is a candidate for genetic testing, he declines at this time.   GERD (gastroesophageal reflux disease)    History of chemotherapy    03-2017   History of radiation therapy    03-2017   Hypertension    Neuromuscular disorder (HCC)    [name]   Pneumonia 4 yrs ago   Past Surgical History:  Procedure Laterality Date   COLONOSCOPY     COMPLETE ESOPHAGECTOMY N/A 04/16/2017   Procedure: ESOPHAGECTOMY COMPLETE,Transhiatal total esophagectomy;  Surgeon: [name], MD;  Location: [facility] [facility];  Service: Thoracic;  Laterality: N/A;  ESOPHAGOGASTRODUODENOSCOPY (EGD) WITH PROPOFOL N/A 03/30/2019   Procedure: ESOPHAGOGASTRODUODENOSCOPY (EGD) WITH PROPOFOL;  Surgeon: Ladene Artist, MD;  Location: [facility];  Service: Endoscopy;  Laterality: N/A;   EUS N/A 10/26/2016   Procedure: UPPER ENDOSCOPIC ULTRASOUND (EUS) LINEAR;  Surgeon: [name], MD;  Location: [facility];  Service: Endoscopy;  Laterality: N/A;   EYE SURGERY Bilateral age 38    for cross eyes   FOREIGN BODY REMOVAL  03/30/2019   Procedure: FOREIGN BODY REMOVAL;  Surgeon: Ladene Artist, MD;   Location: [facility];  Service: Endoscopy;;   IR FLUORO GUIDE PORT INSERTION RIGHT  11/08/2016   right upper chest   IR REMOVAL TUN ACCESS W/ PORT W/O FL MOD SED  04/12/2020   IR US GUIDE VASC ACCESS RIGHT  11/08/2016   JEJUNOSTOMY N/A 04/16/2017   Procedure: Donney Rankins;  Surgeon: [name], MD;  Location: [facility];  Service: Thoracic;  Laterality: N/A;   JEJUNOSTOMY     removal of feeding tube /pt unsure of date   MOUTH SURGERY     POLYPECTOMY     UPPER GASTROINTESTINAL ENDOSCOPY     VIDEO BRONCHOSCOPY N/A 04/16/2017   Procedure: VIDEO BRONCHOSCOPY, Transhiatal Total Esophagectomy, Esophagogastrostomy, pyloromotomy, Feeding Jejunostomy;  Surgeon: [name], MD;  Location: [facility];  Service: Thoracic;  Laterality: N/A;   Current Outpatient Medications on File Prior to Visit  Medication Sig Dispense Refill   acetaminophen (TYLENOL) 500 MG tablet Take 500 mg by mouth every 6 (six) hours as needed.     albuterol (VENTOLIN HFA) 108 (90 Base) MCG/ACT inhaler Inhale 2 puffs into the lungs every 6 (six) hours as needed for wheezing or shortness of breath. 8 g 0   atorvastatin (LIPITOR) 20 MG tablet Take 1 tablet (20 mg total) by mouth daily. 90 tablet 3   cyclobenzaprine (FLEXERIL) 5 MG tablet Take 1 tablet (5 mg total) by mouth 3 (three) times daily as needed for muscle spasms. 30 tablet 0   fluticasone (FLONASE) 50 MCG/ACT nasal spray Place 1 spray into both nostrils daily as needed for allergies or rhinitis. 16 g 2   pantoprazole (PROTONIX) 40 MG tablet TAKE 1 TABLET BY MOUTH TWICE A DAY 60 tablet 0   No current facility-administered medications on file prior to visit.   Allergies  Allergen Reactions   Penicillins Hives, Itching and Swelling    Has patient had a PCN reaction causing immediate rash, facial/tongue/throat swelling, SOB or lightheadedness with hypotension: No Has patient had a PCN reaction causing severe rash involving mucus membranes or skin necrosis:  No Has patient had a PCN reaction that required hospitalization No Has patient had a PCN reaction occurring within the last 10 years: No If all of the above answers are "NO", then may proceed with Cephalosporin use. Unknown Childhood reaction   Social History   Socioeconomic History   Marital status: Legally Separated    Spouse name: Not on file   Number of children: Not on file   Years of education: Not on file   Highest education level: Not on file  Occupational History   Not on file  Tobacco Use   Smoking status: Former    Packs/day: 1.00    Years: 38.00    Pack years: 38.00    Types: Cigarettes    Start date: 10/30/2012    Quit date: 10/31/2012    Years since quitting: 8.2   Smokeless tobacco: Never  Vaping Use   Vaping Use: Never used  Substance and Sexual Activity   Alcohol use: No    Alcohol/week: 0.0 standard drinks   Drug use: No   Sexual activity: Not on file  Other Topics Concern   Not on file  Social History Narrative   Not on file   Social Determinants of Health   Financial Resource Strain: Not on file  Food Insecurity: Not on file  Transportation Needs: Not on file  Physical Activity: Not on file  Stress: Not on file  Social Connections: Not on file  Intimate Partner Violence: Not on file     Review of Systems  All other systems reviewed and are negative.     Objective:   Physical Exam Vitals reviewed.  Constitutional:      General: He is not in acute distress.    Appearance: Normal appearance. He is normal weight. He is not ill-appearing, toxic-appearing or diaphoretic.  HENT:     Head: Normocephalic.     Right Ear: Tympanic membrane, ear canal and external ear normal. There is no impacted cerumen.     Left Ear: Tympanic membrane, ear canal and external ear normal. There is no impacted cerumen.     Nose: Nose normal. No congestion or rhinorrhea.     Mouth/Throat:     Mouth: Mucous membranes are moist.  Eyes:     General: No scleral  icterus.       Right eye: No discharge.        Left eye: No discharge.     Extraocular Movements: Extraocular movements intact.     Conjunctiva/sclera: Conjunctivae normal.     Pupils: Pupils are equal, round, and reactive to light.  Neck:     Vascular: No carotid bruit.  Cardiovascular:     Rate and Rhythm: Normal rate and regular rhythm.     Pulses: Normal pulses.     Heart sounds: Normal heart sounds. No murmur heard.   No friction rub. No gallop.  Pulmonary:     Effort: Pulmonary effort is normal. No respiratory distress.     Breath sounds: No stridor. Wheezing present. No rales.  Abdominal:     General: Bowel sounds are normal. There is no distension.     Palpations: Abdomen is soft. There is no mass.     Tenderness: There is no abdominal tenderness. There is no guarding or rebound.     Hernia: No hernia is present.  Musculoskeletal:     Cervical back: Neck supple. No rigidity or tenderness.     Right lower leg: No edema.     Left lower leg: No edema.  Lymphadenopathy:     Cervical: No cervical adenopathy.  Skin:    Coloration: Skin is not jaundiced or pale.     Findings: No bruising, erythema, lesion or rash.  Neurological:     General: No focal deficit present.     Mental Status: He is alert and oriented to person, place, and time. Mental status is at baseline.     Cranial Nerves: No cranial nerve deficit.     Motor: No weakness.     Gait: Gait normal.     Deep Tendon Reflexes: Reflexes normal.  Psychiatric:        Mood and Affect: Mood normal.        Behavior: Behavior normal.        Thought Content: Thought content normal.        Judgment: Judgment normal.          Assessment &  Plan:  Prostate cancer screening - Plan: PSA  Pure hypercholesterolemia - Plan: Lipid panel  Malignant neoplasm of lower third of esophagus (HCC)  Pulmonary emphysema, unspecified emphysema type ([place])  General medical exam Recheck fasting lipid panel as part of his physical.   Also screen for prostate cancer with a PSA.  Most recent CBC and CMP were normal.  Colonoscopy is up-to-date.  I will consult GI for an EGD due to his dysphagia.  Immunizations are up-to-date although the patient is due for a booster on his shingles vaccine which I recommended.  I recommended starting the patient on Breo 1 inhalation a day and I gave him samples to try due to the wheezing and diminished breath sounds that I am experiencing on his exam.  His blood pressure today is excellent.  The remainder of his preventative care is up-to-date.  He also endorses pain in both legs.  This primarily occurs at night when he is trying to rest.  He feels this uncomfortable sensation in both legs.  He has to stand up and walk which alleviates the pain.  However once he gets still again, the pain will return.  He has a difficult time describing the pain.  It is an aching and throbbing and tenseness of his legs from his knees down.  However it certainly seems to worsen by staying still and improves with getting up and moving.  Therefore I believe it may be restless leg and so we will empirically try him on Requip 1 mg p.o. nightly and see if his symptoms improve

## 2021-01-12 LAB — LIPID PANEL
Cholesterol: 146 mg/dL (ref <200)
HDL: 27 mg/dL — ABNORMAL LOW (ref >=40)
LDL Cholesterol (Calc): 100 mg/dL (calc) — ABNORMAL HIGH
Non-HDL Cholesterol (Calc): 119 mg/dL (calc) (ref <130)
Total CHOL/HDL Ratio: 5.4 (calc) — ABNORMAL HIGH (ref <5.0)
Triglycerides: 93 mg/dL (ref <150)

## 2021-01-12 LAB — PSA: PSA: 0.35 ng/mL (ref <=4.00)

## 2021-01-19 ENCOUNTER — Telehealth: Payer: Self-pay | Admitting: *Deleted

## 2021-01-19 ENCOUNTER — Other Ambulatory Visit: Payer: Self-pay | Admitting: *Deleted

## 2021-01-19 MED ORDER — BREZTRI AEROSPHERE 160-9-4.8 MCG/ACT IN AERO
2.0000 | INHALATION_SPRAY | Freq: Two times a day (BID) | RESPIRATORY_TRACT | 11 refills | Status: DC
Start: 2021-01-19 — End: 2022-07-20

## 2021-01-19 MED ORDER — BREZTRI AEROSPHERE 160-9-4.8 MCG/ACT IN AERO: 2.0000 | INHALATION_SPRAY | Freq: Two times a day (BID) | RESPIRATORY_TRACT | 11 refills | Status: DC

## 2021-01-19 NOTE — Telephone Encounter (Signed)
Received request from pharmacy for [place] on Breztri.   PA submitted.   Dx: J43.9- emphysema   Received immediate determination.   PA [id number] approved 01/19/2021- 01/19/2022

## 2021-01-26 DIAGNOSIS — H5213 Myopia, bilateral: Secondary | ICD-10-CM | POA: Diagnosis not present

## 2021-01-28 ENCOUNTER — Encounter: Payer: Self-pay | Admitting: Hematology

## 2021-01-30 ENCOUNTER — Other Ambulatory Visit: Payer: Self-pay | Admitting: Family Medicine

## 2021-01-30 DIAGNOSIS — K222 Esophageal obstruction: Secondary | ICD-10-CM

## 2021-04-25 ENCOUNTER — Other Ambulatory Visit: Payer: Self-pay

## 2021-04-25 ENCOUNTER — Telehealth: Payer: Self-pay | Admitting: Family Medicine

## 2021-04-25 ENCOUNTER — Ambulatory Visit (INDEPENDENT_AMBULATORY_CARE_PROVIDER_SITE_OTHER): Payer: Medicaid Other | Admitting: *Deleted

## 2021-04-25 DIAGNOSIS — Z23 Encounter for immunization: Secondary | ICD-10-CM | POA: Diagnosis not present

## 2021-04-25 DIAGNOSIS — J441 Chronic obstructive pulmonary disease with (acute) exacerbation: Secondary | ICD-10-CM

## 2021-04-25 MED ORDER — ALBUTEROL SULFATE HFA 108 (90 BASE) MCG/ACT IN AERS: 2.0000 | INHALATION_SPRAY | Freq: Four times a day (QID) | RESPIRATORY_TRACT | 3 refills | Status: AC | PRN

## 2021-04-25 NOTE — Telephone Encounter (Signed)
Patient needs refill of albuterol. He has 10-20 puffs left.   Pharmacy confirmed as   [id number] - [place], [place]  [address] [place] [address]  Phone:  [phone]  Fax:  [phone]  DEA #:  [id number]  Please advise at [phone]

## 2021-04-25 NOTE — Telephone Encounter (Signed)
Prescription sent to pharmacy.

## 2021-05-12 ENCOUNTER — Other Ambulatory Visit: Payer: Self-pay | Admitting: Family Medicine

## 2021-05-12 DIAGNOSIS — E7800 Pure hypercholesterolemia, unspecified: Secondary | ICD-10-CM

## 2021-05-12 DIAGNOSIS — K219 Gastro-esophageal reflux disease without esophagitis: Secondary | ICD-10-CM

## 2021-05-12 DIAGNOSIS — K222 Esophageal obstruction: Secondary | ICD-10-CM

## 2021-05-31 ENCOUNTER — Encounter: Payer: Self-pay | Admitting: Physician Assistant

## 2021-05-31 ENCOUNTER — Ambulatory Visit: Payer: Medicaid Other | Admitting: Physician Assistant

## 2021-05-31 VITALS — BP 126/76 | HR 80 | Ht 59.75 in | Wt 127.1 lb

## 2021-05-31 DIAGNOSIS — Z9049 Acquired absence of other specified parts of digestive tract: Secondary | ICD-10-CM | POA: Diagnosis not present

## 2021-05-31 DIAGNOSIS — Z8501 Personal history of malignant neoplasm of esophagus: Secondary | ICD-10-CM

## 2021-05-31 DIAGNOSIS — Z9889 Other specified postprocedural states: Secondary | ICD-10-CM

## 2021-05-31 DIAGNOSIS — R1310 Dysphagia, unspecified: Secondary | ICD-10-CM | POA: Diagnosis not present

## 2021-05-31 NOTE — Progress Notes (Signed)
Addendum: Reviewed and agree with assessment and management plan. Tobin Cadiente M, MD  

## 2021-05-31 NOTE — Patient Instructions (Signed)
If you are age 62 or younger, your body mass index should be between 19-25. Your Body mass index is 25.04 kg/m. If this is out of the aformentioned range listed, please consider follow up with your Primary Care Provider.  ________________________________________________________  The [facility] providers would like to encourage you to use [facility] to communicate with providers for non-urgent requests or questions.  Due to long hold times on the telephone, sending your provider a message by [facility] may be a faster and more efficient way to get a response.  Please allow 48 business hours for a response.  Please remember that this is for non-urgent requests.  _______________________________________________________  Dennis Bast have been scheduled for an endoscopy. Please follow written instructions given to you at your visit today. If you use inhalers (even only as needed), please bring them with you on the day of your procedure.  Follow up pending the results of your Endoscopy.  Thank you for entrusting me with your care and choosing [facility].  Amy Esterwood, PA-C
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
## 2021-06-03 ENCOUNTER — Ambulatory Visit (AMBULATORY_SURGERY_CENTER): Payer: Medicaid Other | Admitting: Internal Medicine

## 2021-06-03 ENCOUNTER — Encounter: Payer: Self-pay | Admitting: Internal Medicine

## 2021-06-03 ENCOUNTER — Other Ambulatory Visit: Payer: Self-pay

## 2021-06-03 VITALS — BP 116/73 | HR 83 | Temp 98.9°F | Resp 14 | Ht 59.0 in | Wt 127.0 lb

## 2021-06-03 DIAGNOSIS — R1310 Dysphagia, unspecified: Secondary | ICD-10-CM | POA: Diagnosis not present

## 2021-06-03 DIAGNOSIS — K9189 Other postprocedural complications and disorders of digestive system: Secondary | ICD-10-CM

## 2021-06-03 DIAGNOSIS — K222 Esophageal obstruction: Secondary | ICD-10-CM

## 2021-06-03 DIAGNOSIS — Z8501 Personal history of malignant neoplasm of esophagus: Secondary | ICD-10-CM

## 2021-06-03 DIAGNOSIS — K31819 Angiodysplasia of stomach and duodenum without bleeding: Secondary | ICD-10-CM

## 2021-06-03 HISTORY — PX: UPPER GASTROINTESTINAL ENDOSCOPY: SHX188

## 2021-06-03 MED ORDER — SODIUM CHLORIDE 0.9 % IV SOLN: 500.0000 mL | Freq: Once | INTRAVENOUS | Status: DC

## 2021-06-03 NOTE — Progress Notes (Signed)
Pt's states no medical or surgical changes since previsit or office visit.   Vitals AG 

## 2021-06-03 NOTE — Progress Notes (Signed)
To PACU< VSS. Report to [email or web address].tb 

## 2021-06-03 NOTE — Progress Notes (Signed)
Called to room to assist during endoscopic procedure.  Patient ID and intended procedure confirmed with present staff. Received instructions for my participation in the procedure from the performing physician.  

## 2021-06-03 NOTE — Op Note (Signed)
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 The patient's tolerance of                            previous anesthesia was also reviewed. The risks                            and benefits of the procedure and the sedation                            options and risks were discussed with the patient.                            All questions were answered, and informed consent                            was obtained. Prior Anticoagulants: The patient has                            taken no previous anticoagulant or antiplatelet                            agents. ASA Grade Assessment: II - A patient with  mild systemic disease. After reviewing the risks                            and benefits, the patient was deemed in                            satisfactory condition to undergo the procedure.                           After obtaining informed consent, the endoscope was                            passed under direct vision. Throughout the                            procedure, the patient's blood pressure, pulse,  and                            oxygen saturations were monitored continuously. The                            GIF [id number] #[id number] was introduced through the                            mouth, and advanced to the second part of duodenum.                            The upper GI endoscopy was accomplished without                            difficulty. The patient tolerated the procedure                            well. Scope In: Scope Out: Findings:                 An esophagogastric anastomosis was found in the                            upper third of the esophagus at 20 cm from the                            incisors. There was mild narrowing at the                            anastomosis, but no resistance to passage of the                            adult endoscope. A TTS dilator was passed through                            the scope. Dilation with an 18-19-20 mm balloon  dilator was performed to 20 mm. The dilation site                            was examined and showed mild mucosal disruption and                            improvement in narrowing. No evidence of                            esophagitis or recurrent malignancy.                           A single small angioectasia with no bleeding was                            found in the gastric body.                           The exam of the stomach was otherwise normal.                           The examined duodenum was normal. Complications:            No immediate complications. Estimated Blood Loss:     Estimated blood loss was minimal. Impression:               - An esophagogastric anastomosis was found without                            esophagitis or recurrent malignancy. Dilated to 20                            mm with balloon.                           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                           - Normal examined duodenum.                           - No specimens  collected. Recommendation:           - Patient has a contact number available for                            emergencies. The signs and symptoms of potential                            delayed complications were discussed with the                            patient. Return to normal activities tomorrow.                            Written discharge instructions were provided to the  patient.                           - Resume previous diet. Soft diet, chew food well                            and take small bites.                           - Continue present medications.                           - Repeat upper endoscopy as needed for retreatment. [name], MD 06/03/2021 5:05:53 PM This report has been signed electronically.

## 2021-06-03 NOTE — Patient Instructions (Signed)
Resume previous diet and medications (Soft foods, chew food well and take small bites) Repeat upper endoscopy as needed for retreatment.  YOU HAD AN ENDOSCOPIC PROCEDURE TODAY AT [facility]:   Refer to the procedure report that was given to you for any specific questions about what was found during the examination.  If the procedure report does not answer your questions, please call your gastroenterologist to clarify.  If you requested that your care partner not be given the details of your procedure findings, then the procedure report has been included in a sealed envelope for you to review at your convenience later.  YOU SHOULD EXPECT: Some feelings of bloating in the abdomen. Passage of more gas than usual.  Walking can help get rid of the air that was put into your GI tract during the procedure and reduce the bloating. If you had a lower endoscopy (such as a colonoscopy or flexible sigmoidoscopy) you may notice spotting of blood in your stool or on the toilet paper. If you underwent a bowel prep for your procedure, you may not have a normal bowel movement for a few days.  Please Note:  You might notice some irritation and congestion in your nose or some drainage.  This is from the oxygen used during your procedure.  There is no need for concern and it should clear up in a day or so.  SYMPTOMS TO REPORT IMMEDIATELY:  Following upper endoscopy (EGD)  Vomiting of blood or coffee ground material  New chest pain or pain under the shoulder blades  Painful or persistently difficult swallowing  New shortness of breath  Fever of 100F or higher  Black, tarry-looking stools  For urgent or emergent issues, a gastroenterologist can be reached at any hour by calling [phone]. Do not use MyChart messaging for urgent concerns.    DIET:  We do recommend a small meal at first, but then you may proceed to your regular diet.  Drink plenty of fluids but you should avoid alcoholic  beverages for 24 hours.  ACTIVITY:  You should plan to take it easy for the rest of today and you should NOT DRIVE or use heavy machinery until tomorrow (because of the sedation medicines used during the test).    FOLLOW UP: Our staff will call the number listed on your records 48-72 hours following your procedure to check on you and address any questions or concerns that you may have regarding the information given to you following your procedure. If we do not reach you, we will leave a message.  We will attempt to reach you two times.  During this call, we will ask if you have developed any symptoms of COVID 19. If you develop any symptoms (ie: fever, flu-like symptoms, shortness of breath, cough etc.) before then, please call [phone].  If you test positive for Covid 19 in the 2 weeks post procedure, please call and report this information to [place].    If any biopsies were taken you will be contacted by phone or by letter within the next 1-3 weeks.  Please call us at [phone] if you have not heard about the biopsies in 3 weeks.    SIGNATURES/CONFIDENTIALITY: You and/or your care partner have signed paperwork which will be entered into your electronic medical record.  These signatures attest to the fact that that the information above on your After Visit Summary has been reviewed and is understood.  Full responsibility of the confidentiality of this discharge  information lies with you and/or your care-partner.

## 2021-06-03 NOTE — Progress Notes (Signed)
GASTROENTEROLOGY PROCEDURE H&P NOTE   Primary Care Physician: [name], MD    Reason for Procedure:  History of esophageal cancer status post esophagectomy and gastric pull-through, history of anastomotic stricture  Plan:    EGD with dilation  Patient is appropriate for endoscopic procedure(s) in the ambulatory ([place]) setting.  The nature of the procedure, as well as the risks, benefits, and alternatives were carefully and thoroughly reviewed with the patient. Ample time for discussion and questions allowed. The patient understood, was satisfied, and agreed to proceed.     HPI: Darrell Cunningham is a 60 y.o. male who presents for EGD.  Seen recently office.  See this note for details.  No change in condition since this time.  No chest pain or shortness of breath today.  Past Medical History:  Diagnosis Date   Allergy    Arthritis    Asthma    as a child   Cancer ([place]) 10/09/2016   ESOPHAGUS CARCINOMA    COPD (chronic obstructive pulmonary disease) (HCC)    CP (cerebral palsy), spastic ([place])    right   Dysphagia    Dyspnea    with exersion   Emphysema of lung ([place])    Encounter for nonprocreative genetic counseling 10/31/2016   Mr. Tucholski underwent genetic counseling for hereditary cancer syndromes on 10/31/2016. Though he is a candidate for genetic testing, he declines at this time.   GERD (gastroesophageal reflux disease)    History of chemotherapy    03-2017   History of radiation therapy    03-2017   Hypertension    Neuromuscular disorder (HCC)    [name]   Pneumonia 4 yrs ago    Past Surgical History:  Procedure Laterality Date   COLONOSCOPY     COMPLETE ESOPHAGECTOMY N/A 04/16/2017   Procedure: ESOPHAGECTOMY COMPLETE,Transhiatal total esophagectomy;  Surgeon: [name], MD;  Location: [facility] OR;  Service: Thoracic;  Laterality: N/A;   ESOPHAGOGASTRODUODENOSCOPY (EGD) WITH PROPOFOL N/A 03/30/2019   Procedure: ESOPHAGOGASTRODUODENOSCOPY (EGD)  WITH PROPOFOL;  Surgeon: Ladene Artist, MD;  Location: [facility] ENDOSCOPY;  Service: Endoscopy;  Laterality: N/A;   EUS N/A 10/26/2016   Procedure: UPPER ENDOSCOPIC ULTRASOUND (EUS) LINEAR;  Surgeon: [name], MD;  Location: [facility] ENDOSCOPY;  Service: Endoscopy;  Laterality: N/A;   EYE SURGERY Bilateral age 7    for cross eyes   FOREIGN BODY REMOVAL  03/30/2019   Procedure: FOREIGN BODY REMOVAL;  Surgeon: Ladene Artist, MD;  Location: [facility];  Service: Endoscopy;;   IR FLUORO GUIDE PORT INSERTION RIGHT  11/08/2016   right upper chest   IR REMOVAL TUN ACCESS W/ PORT W/O FL MOD SED  04/12/2020   IR US GUIDE VASC ACCESS RIGHT  11/08/2016   JEJUNOSTOMY N/A 04/16/2017   Procedure: Donney Rankins;  Surgeon: [name], MD;  Location: [place] [facility];  Service: Thoracic;  Laterality: N/A;   JEJUNOSTOMY     removal of feeding tube /pt unsure of date   [facility]  06/03/2021   hx of esoph ca   VIDEO BRONCHOSCOPY N/A 04/16/2017   Procedure: VIDEO BRONCHOSCOPY, Transhiatal Total Esophagectomy, Esophagogastrostomy, pyloromotomy, Feeding Jejunostomy;  Surgeon: [name], MD;  Location: [facility];  Service: Thoracic;  Laterality: N/A;    Prior to Admission medications   Medication Sig Start Date End Date Taking? Authorizing Provider  acetaminophen (TYLENOL) 500 MG  tablet Take 500 mg by mouth every 6 (six) hours as needed.   Yes [provider]  albuterol (VENTOLIN HFA) 108 (90 Base) MCG/ACT inhaler Inhale 2 puffs into the lungs every 6 (six) hours as needed for wheezing or shortness of breath. 04/25/21  Yes [name], MD  atorvastatin (LIPITOR) 20 MG tablet TAKE 1 TABLET BY MOUTH EVERY DAY 05/12/21  Yes [name], MD  pantoprazole (PROTONIX) 40 MG tablet TAKE 1 TABLET BY MOUTH TWICE A DAY 05/12/21  Yes [name], MD  rOPINIRole (REQUIP) 1 MG tablet Take 1  tablet (1 mg total) by mouth at bedtime. 01/11/21  Yes [name], MD  Budeson-Glycopyrrol-Formoterol (BREZTRI AEROSPHERE) 160-9-4.8 MCG/ACT AERO Inhale 2 puffs into the lungs 2 (two) times daily. 01/19/21   [name], MD  cyclobenzaprine (FLEXERIL) 5 MG tablet Take 1 tablet (5 mg total) by mouth 3 (three) times daily as needed for muscle spasms. 01/07/19   [name], MD  fluticasone (FLONASE) 50 MCG/ACT nasal spray Place 1 spray into both nostrils daily as needed for allergies or rhinitis. 11/22/20   Eulogio Bear, NP    Current Outpatient Medications  Medication Sig Dispense Refill   acetaminophen (TYLENOL) 500 MG tablet Take 500 mg by mouth every 6 (six) hours as needed.     albuterol (VENTOLIN HFA) 108 (90 Base) MCG/ACT inhaler Inhale 2 puffs into the lungs every 6 (six) hours as needed for wheezing or shortness of breath. 8 g 3   atorvastatin (LIPITOR) 20 MG tablet TAKE 1 TABLET BY MOUTH EVERY DAY 90 tablet 3   pantoprazole (PROTONIX) 40 MG tablet TAKE 1 TABLET BY MOUTH TWICE A DAY 60 tablet 0   rOPINIRole (REQUIP) 1 MG tablet Take 1 tablet (1 mg total) by mouth at bedtime. 30 tablet 5   Budeson-Glycopyrrol-Formoterol (BREZTRI AEROSPHERE) 160-9-4.8 MCG/ACT AERO Inhale 2 puffs into the lungs 2 (two) times daily. 10.7 g 11   cyclobenzaprine (FLEXERIL) 5 MG tablet Take 1 tablet (5 mg total) by mouth 3 (three) times daily as needed for muscle spasms. 30 tablet 0   fluticasone (FLONASE) 50 MCG/ACT nasal spray Place 1 spray into both nostrils daily as needed for allergies or rhinitis. 16 g 2   Current Facility-Administered Medications  Medication Dose Route Frequency Provider Last Rate Last Admin   0.9 %  sodium chloride infusion  500 mL Intravenous Once Huyen Perazzo, [name], MD        Allergies as of 06/03/2021 - Review Complete 06/03/2021  Allergen Reaction Noted   Penicillins Hives, Itching, and Swelling 11/01/2012    Family History  Problem Relation Age of Onset    COPD Father    Breast cancer Sister 41       Deceased at 57 of breast cancer   Ovarian cancer Maternal Aunt    Colon cancer Maternal Grandmother 61   Breast cancer Maternal Grandmother 70   Stomach cancer Neg Hx    Esophageal cancer Neg Hx    Colon polyps Neg Hx    Rectal cancer Neg Hx     Social History   Socioeconomic History   Marital status: Legally Separated    Spouse name: Not on file   Number of children: Not on file   Years of education: Not on file   Highest education level: Not on file  Occupational History   Not on file  Tobacco Use   Smoking status: Former    Packs/day: 1.00  Years: 38.00    Pack years: 38.00    Types: Cigarettes    Start date: 10/30/2012    Quit date: 10/31/2012    Years since quitting: 8.5   Smokeless tobacco: Never  Vaping Use   Vaping Use: Never used  Substance and Sexual Activity   Alcohol use: No    Alcohol/week: 0.0 standard drinks   Drug use: No   Sexual activity: Not on file  Other Topics Concern   Not on file  Social History Narrative   Not on file   Social Determinants of Health   Financial Resource Strain: Not on file  Food Insecurity: Not on file  Transportation Needs: Not on file  Physical Activity: Not on file  Stress: Not on file  Social Connections: Not on file  Intimate Partner Violence: Not on file    Physical Exam: Vital signs in last 24 hours: @BP  (!) 144/74   Pulse 82   Temp 98.9 F (37.2 C) (Temporal)   Ht 4\' 11"  (1.499 m)   Wt 127 lb (57.6 kg)   SpO2 98%   BMI 25.65 kg/m  GEN: NAD EYE: Sclerae anicteric ENT: MMM CV: Non-tachycardic Pulm: CTA b/l GI: Soft, NT/ND NEURO:  Alert & Oriented x 3   Zenovia Jarred, MD [facility]  06/03/2021 4:40 PM

## 2021-06-07 ENCOUNTER — Telehealth: Payer: Self-pay | Admitting: *Deleted

## 2021-06-07 NOTE — Telephone Encounter (Signed)
  Follow up Call-  Call back number 06/03/2021 03/17/2020 05/26/2019 09/24/2018  Post procedure Call Back phone  # [phone]  Permission to leave phone message Yes Yes Yes Yes  Some recent data might be hidden     Patient questions:  Do you have a fever, pain , or abdominal swelling? No. Pain Score  0 *  Have you tolerated food without any problems? Yes.    Have you been able to return to your normal activities? Yes.    Do you have any questions about your discharge instructions: Diet   No. Medications  No. Follow up visit  No.  Do you have questions or concerns about your Care? No.  Actions: * If pain score is 4 or above: No action needed, pain <4.  Have you developed a fever since your procedure? no  2.   Have you had an respiratory symptoms (SOB or cough) since your procedure? no  3.   Have you tested positive for COVID 19 since your procedure no  4.   Have you had any family members/close contacts diagnosed with the COVID 19 since your procedure?  no   If yes to any of these questions please route to Joylene John, RN and Joella Prince, RN

## 2021-06-18 DIAGNOSIS — R569 Unspecified convulsions: Secondary | ICD-10-CM | POA: Diagnosis not present

## 2021-06-18 DIAGNOSIS — R000 Tachycardia, unspecified: Secondary | ICD-10-CM | POA: Diagnosis not present

## 2021-06-19 ENCOUNTER — Emergency Department (HOSPITAL_COMMUNITY): Payer: Medicaid Other

## 2021-06-19 ENCOUNTER — Emergency Department (HOSPITAL_COMMUNITY)
Admission: EM | Admit: 2021-06-19 | Discharge: 2021-06-19 | Disposition: A | Discharge status: Home / Self Care | Payer: Medicaid Other | Attending: Emergency Medicine | Admitting: Emergency Medicine

## 2021-06-19 DIAGNOSIS — Z5321 Procedure and treatment not carried out due to patient leaving prior to being seen by health care provider: Secondary | ICD-10-CM | POA: Diagnosis not present

## 2021-06-19 DIAGNOSIS — R569 Unspecified convulsions: Secondary | ICD-10-CM | POA: Diagnosis not present

## 2021-06-19 DIAGNOSIS — R000 Tachycardia, unspecified: Secondary | ICD-10-CM | POA: Diagnosis not present

## 2021-06-19 LAB — CBC WITH DIFFERENTIAL/PLATELET
Abs Immature Granulocytes: 0.01 10*3/uL (ref 0.00–0.07)
Basophils Absolute: 0 10*3/uL (ref 0.0–0.1)
Basophils Relative: 0 %
Eosinophils Absolute: 0.3 10*3/uL (ref 0.0–0.5)
Eosinophils Relative: 3 %
HCT: 38.1 % — ABNORMAL LOW (ref 39.0–52.0)
Hemoglobin: 12.4 g/dL — ABNORMAL LOW (ref 13.0–17.0)
Immature Granulocytes: 0 %
Lymphocytes Relative: 15 %
Lymphs Abs: 1.4 10*3/uL (ref 0.7–4.0)
MCH: 31.2 pg (ref 26.0–34.0)
MCHC: 32.5 g/dL (ref 30.0–36.0)
MCV: 96 fL (ref 80.0–100.0)
Monocytes Absolute: 0.6 10*3/uL (ref 0.1–1.0)
Monocytes Relative: 7 %
Neutro Abs: 7.3 10*3/uL (ref 1.7–7.7)
Neutrophils Relative %: 75 %
Platelets: 228 10*3/uL (ref 150–400)
RBC: 3.97 MIL/uL — ABNORMAL LOW (ref 4.22–5.81)
RDW: 13.3 % (ref 11.5–15.5)
WBC: 9.6 10*3/uL (ref 4.0–10.5)
nRBC: 0 % (ref 0.0–0.2)

## 2021-06-19 LAB — COMPREHENSIVE METABOLIC PANEL
ALT: 23 U/L (ref 0–44)
AST: 19 U/L (ref 15–41)
Albumin: 3.3 g/dL — ABNORMAL LOW (ref 3.5–5.0)
Alkaline Phosphatase: 71 U/L (ref 38–126)
Anion gap: 6 (ref 5–15)
BUN: 12 mg/dL (ref 8–23)
CO2: 26 mmol/L (ref 22–32)
Calcium: 8.9 mg/dL (ref 8.9–10.3)
Chloride: 106 mmol/L (ref 98–111)
Creatinine, Ser: 0.85 mg/dL (ref 0.61–1.24)
GFR, Estimated: >60 mL/min (ref >60)
Glucose, Bld: 151 mg/dL — ABNORMAL HIGH (ref 70–99)
Potassium: 3.9 mmol/L (ref 3.5–5.1)
Sodium: 138 mmol/L (ref 135–145)
Total Bilirubin: 0.4 mg/dL (ref 0.3–1.2)
Total Protein: 6.6 g/dL (ref 6.5–8.1)

## 2021-06-19 LAB — ETHANOL: Alcohol, Ethyl (B): <10 mg/dL (ref <10)

## 2021-06-19 LAB — MAGNESIUM: Magnesium: 2.3 mg/dL (ref 1.7–2.4)

## 2021-06-19 MED ORDER — LORAZEPAM 2 MG/ML IJ SOLN: 4.0000 mg | INTRAMUSCULAR | Status: DC | PRN

## 2021-06-19 NOTE — ED Provider Notes (Signed)
Emergency Medicine Provider Triage Evaluation Note  Darrell Cunningham , a 60 y.o. male  was evaluated in triage.  Pt complains of new onset seizure.  Accompanied by family who states that he had an episode of unresponsiveness with full body shaking.  No hx of seizure.  Denies any complaints now.  Hx of cerebral palsy  Review of Systems  Positive: ?seizure Negative: Weakness, numbness  Physical Exam  BP 128/84 (BP Location: Right Arm)   Pulse (!) 109   Resp 18   SpO2 98%  Gen:   Awake, no distress   Resp:  Normal effort  MSK:   Moves extremities without difficulty  Other:    Medical Decision Making  Medically screening exam initiated at 1:03 AM.  Appropriate orders placed.  [name] was informed that the remainder of the evaluation will be completed by another provider, this initial triage assessment does not replace that evaluation, and the importance of remaining in the ED until their evaluation is complete.  ?new onset seizure   [name], PA-C 06/19/21 0105    [name], MD 06/20/21 [phone]

## 2021-06-19 NOTE — ED Notes (Signed)
PT leaving IV taken out.

## 2021-06-19 NOTE — ED Triage Notes (Signed)
Patient arrived with EMS from home witnessed seizure activity by family members this evening , no injury , alert and oriented at arrival/respirations unlabored .

## 2021-06-20 ENCOUNTER — Telehealth: Payer: Self-pay

## 2021-06-20 NOTE — Telephone Encounter (Signed)
Transition Care Management Follow-up Telephone Call Date of discharge and from where: 06/19/2021 from [facility] How have you been since you were released from the hospital? Pt stated that he is feeling better. Pt doesn't know what happened exactly.  Any questions or concerns? No  Items Reviewed: Did the pt receive and understand the discharge instructions provided? Yes  Medications obtained and verified? Yes  Other? No  Any new allergies since your discharge? No  Dietary orders reviewed? No Do you have support at home? Yes   Functional Questionnaire: (I = Independent and D = Dependent) ADLs: I Bathing/Dressing- I Meal Prep- I Eating- I Maintaining continence- I Transferring/Ambulation- I Managing Meds- I  Follow up appointments reviewed: PCP Hospital f/u appt confirmed? Yes  Scheduled to see Jenna Luo, MD on 06/28/2021 @ 12:30 pm. [facility] f/u appt confirmed? No  No recommendation for follow up to Neurology.  Are transportation arrangements needed? No  If their condition worsens, is the pt aware to call PCP or go to the Emergency Dept.? Yes Was the patient provided with contact information for the PCP's office or ED? Yes Was to pt encouraged to call back with questions or concerns? Yes

## 2021-06-28 ENCOUNTER — Encounter: Payer: Self-pay | Admitting: Family Medicine

## 2021-06-28 ENCOUNTER — Ambulatory Visit: Payer: Medicaid Other | Admitting: Family Medicine

## 2021-06-28 ENCOUNTER — Other Ambulatory Visit: Payer: Self-pay

## 2021-06-28 VITALS — BP 112/64 | HR 96 | Temp 97.0°F | Ht 59.0 in | Wt 127.6 lb

## 2021-06-28 DIAGNOSIS — R569 Unspecified convulsions: Secondary | ICD-10-CM | POA: Diagnosis not present

## 2021-06-28 MED ORDER — LEVETIRACETAM 500 MG PO TABS: 500.0000 mg | ORAL_TABLET | Freq: Two times a day (BID) | ORAL | 1 refills | Status: DC

## 2021-06-28 NOTE — Progress Notes (Signed)
Subjective:    Patient ID: Darrell Cunningham, male    DOB: October 03, 1960, 60 y.o.   MRN: [id number]  Patient has a history of esophageal adenocarcinoma, cT3N1M0, stage III, ypT0N0.  I have copied portions of his last oncology note below for my reference:   -He was diagnosed in 09/2016. He is s/p neoadjuvant ChemoRT and esophagectomy. he achieved complete pathological response to neoadjuvant therapy  -His risk of recurrence is low. I recommended cancer surveillance for 5 years. -He has had multiple EGD for dilatation. His last EGD in 03/2019 for obstruction, no evidence of local recurrence on EGD.  -I personally reviewed and discussed his CT CAP from 03/09/20 with pt which showed NED. He has aortic Atherosclerosis.  -He is clinically stable. Labs reviewed, except BG 102, albumin 3.4. There is no clinical concern for recurrence.  -He is ready to have PAC removed by IR.  -He is almost 3.5 years since cancer diagnosis. His risk of recurrence has significantly decreased. Continue Surveillance.  -f/u in 9 months. Continue to f/u with Dr. Pia Mau in the interim.   06/28/21  Patient was seen in the emergency room November 20 for seizure.  He was apparently laying on his bed at home watching TV.  His daughter noticed him shaking uncontrollably.  She states that he suddenly lost consciousness.  His eyes rolled back into his head.  He began to shake in his arms and his legs.  This lasted a minute or so.  There was no bowel or bladder incontinence.  Afterwards, the patient was confused and dazed.  He states that his only recollection was watching TV and then waking up surrounded by family members.  He went to the emergency room where they obtained a CBC and a CMP that were normal aside from an isolated glucose of 150.  Head CT showed: Focal encephalomalacia within the left parietal lobe. Associated neuro epithelial cyst, chronic in nature with evidence of remodeling of the inner table of the adjacent  calvarium. No acute intracranial abnormality. Patient eloped without being seen.  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 Balboni underwent genetic counseling for hereditary cancer syndromes on 10/31/2016. Though he is a candidate for genetic testing, he declines at this time.   GERD (gastroesophageal reflux disease)    History of chemotherapy    03-2017   History of radiation therapy    03-2017   Hypertension    Neuromuscular disorder (HCC)    [name]   Pneumonia 4 yrs ago   Past Surgical History:  Procedure Laterality Date   COLONOSCOPY     COMPLETE ESOPHAGECTOMY N/A 04/16/2017   Procedure: ESOPHAGECTOMY COMPLETE,Transhiatal total esophagectomy;  Surgeon: [name], MD;  Location: [facility] OR;  Service: Thoracic;  Laterality: N/A;   ESOPHAGOGASTRODUODENOSCOPY (EGD) WITH PROPOFOL N/A 03/30/2019   Procedure: ESOPHAGOGASTRODUODENOSCOPY (EGD) WITH PROPOFOL;  Surgeon: Ladene Artist, MD;  Location: [facility] ENDOSCOPY;  Service: Endoscopy;  Laterality: N/A;   EUS N/A 10/26/2016   Procedure: UPPER ENDOSCOPIC ULTRASOUND (EUS) LINEAR;  Surgeon: [name], MD;  Location:  [facility] ENDOSCOPY;  Service: Endoscopy;  Laterality: N/A;   EYE SURGERY Bilateral age 19    for cross eyes   FOREIGN BODY REMOVAL  03/30/2019   Procedure: FOREIGN BODY REMOVAL;  Surgeon: Ladene Artist, MD;  Location: [place];  Service: Endoscopy;;   IR FLUORO GUIDE PORT INSERTION RIGHT  11/08/2016   right upper chest   IR REMOVAL TUN ACCESS W/ PORT W/O FL MOD SED  04/12/2020   IR US GUIDE VASC ACCESS RIGHT  11/08/2016   JEJUNOSTOMY N/A 04/16/2017   Procedure: Donney Rankins;   Surgeon: [name], MD;  Location: [place];  Service: Thoracic;  Laterality: N/A;   JEJUNOSTOMY     removal of feeding tube /pt unsure of date   [place] ENDOSCOPY  06/03/2021   hx of esoph ca   VIDEO BRONCHOSCOPY N/A 04/16/2017   Procedure: VIDEO BRONCHOSCOPY, Transhiatal Total Esophagectomy, Esophagogastrostomy, pyloromotomy, Feeding Jejunostomy;  Surgeon: [name], MD;  Location: [place];  Service: Thoracic;  Laterality: N/A;   Current Outpatient Medications on File Prior to Visit  Medication Sig Dispense Refill   acetaminophen (TYLENOL) 500 MG tablet Take 500 mg by mouth every 6 (six) hours as needed.     albuterol (VENTOLIN HFA) 108 (90 Base) MCG/ACT inhaler Inhale 2 puffs into the lungs every 6 (six) hours as needed for wheezing or shortness of breath. 8 g 3   atorvastatin (LIPITOR) 20 MG tablet TAKE 1 TABLET BY MOUTH EVERY DAY 90 tablet 3   Budeson-Glycopyrrol-Formoterol (BREZTRI AEROSPHERE) 160-9-4.8 MCG/ACT AERO Inhale 2 puffs into the lungs 2 (two) times daily. 10.7 g 11   cyclobenzaprine (FLEXERIL) 5 MG tablet Take 1 tablet (5 mg total) by mouth 3 (three) times daily as needed for muscle spasms. 30 tablet 0   fluticasone (FLONASE) 50 MCG/ACT nasal spray Place 1 spray into both nostrils daily as needed for allergies or rhinitis. 16 g 2   pantoprazole (PROTONIX) 40 MG tablet TAKE 1 TABLET BY MOUTH TWICE A DAY 60 tablet 0   rOPINIRole (REQUIP) 1 MG tablet Take 1 tablet (1 mg total) by mouth at bedtime. 30 tablet 5   No current facility-administered medications on file prior to visit.   Allergies  Allergen Reactions   Penicillins Hives, Itching and Swelling    Has patient had a PCN reaction causing immediate rash, facial/tongue/throat swelling, SOB or lightheadedness with hypotension: No Has patient had a PCN reaction causing severe rash involving mucus membranes or skin necrosis:  No Has patient had a PCN reaction that required hospitalization No Has patient had a PCN reaction occurring within the last 10 years: No If all of the above answers are "NO", then may proceed with Cephalosporin use. Unknown Childhood reaction   Social History   Socioeconomic History   Marital status: Legally Separated    Spouse name: Not on file   Number of children: Not on file   Years of education: Not on file   Highest education level: Not on file  Occupational History   Not on file  Tobacco Use   Smoking status: Former    Packs/day: 1.00    Years: 38.00    Pack years: 38.00    Types: Cigarettes    Start date: 10/30/2012    Quit date: 10/31/2012    Years since quitting: 8.6   Smokeless tobacco: Never  Vaping Use   Vaping Use:  Never used  Substance and Sexual Activity   Alcohol use: No    Alcohol/week: 0.0 standard drinks   Drug use: No   Sexual activity: Not on file  Other Topics Concern   Not on file  Social History Narrative   Not on file   Social Determinants of Health   Financial Resource Strain: Not on file  Food Insecurity: Not on file  Transportation Needs: Not on file  Physical Activity: Not on file  Stress: Not on file  Social Connections: Not on file  Intimate Partner Violence: Not on file     Review of Systems  All other systems reviewed and are negative.     Objective:   Physical Exam Vitals reviewed.  Constitutional:      General: He is not in acute distress.    Appearance: Normal appearance. He is normal weight. He is not ill-appearing, toxic-appearing or diaphoretic.  HENT:     Head: Normocephalic.     Right Ear: Tympanic membrane, ear canal and external ear normal. There is no impacted cerumen.     Left Ear: Tympanic membrane, ear canal and external ear normal. There is no impacted cerumen.     Nose: Nose normal. No congestion or rhinorrhea.     Mouth/Throat:     Mouth: Mucous membranes are moist.  Eyes:     General: No scleral  icterus.       Right eye: No discharge.        Left eye: No discharge.     Extraocular Movements: Extraocular movements intact.     Conjunctiva/sclera: Conjunctivae normal.     Pupils: Pupils are equal, round, and reactive to light.  Neck:     Vascular: No carotid bruit.  Cardiovascular:     Rate and Rhythm: Normal rate and regular rhythm.     Pulses: Normal pulses.     Heart sounds: Normal heart sounds. No murmur heard.   No friction rub. No gallop.  Pulmonary:     Effort: Pulmonary effort is normal. No respiratory distress.     Breath sounds: No stridor. Wheezing present. No rales.  Abdominal:     General: Bowel sounds are normal. There is no distension.     Palpations: Abdomen is soft. There is no mass.     Tenderness: There is no abdominal tenderness. There is no guarding or rebound.     Hernia: No hernia is present.  Musculoskeletal:     Cervical back: Neck supple. No rigidity or tenderness.     Right lower leg: No edema.     Left lower leg: No edema.  Lymphadenopathy:     Cervical: No cervical adenopathy.  Skin:    Coloration: Skin is not jaundiced or pale.     Findings: No bruising, erythema, lesion or rash.  Neurological:     General: No focal deficit present.     Mental Status: He is alert and oriented to person, place, and time. Mental status is at baseline.     Cranial Nerves: No cranial nerve deficit.     Motor: No weakness.     Gait: Gait normal.     Deep Tendon Reflexes: Reflexes normal.  Psychiatric:        Mood and Affect: Mood normal.        Behavior: Behavior normal.        Thought Content: Thought content normal.        Judgment: Judgment normal.  Assessment & Plan:  New onset seizure [facility]) - Plan: Ambulatory referral to Neurology  I am concerned because this is a new onset seizure that was unprovoked.  He denies any recent fevers, chills, head trauma, medication changes, drug use, etc. that would have triggered the seizure.  It certainly  does not sound like a vasovagal episode.  Therefore [place] start the patient on Keppra 500 mg p.o. twice daily and schedule follow-up with neurology as soon as possible.  I counseled the patient to avoid driving for the next 6 months until he is seizure-free for 6 months.  Also recommended he avoid any activities where he would become injured if he were to have another seizure while performing them such as climbing ladders, etc.

## 2021-07-08 ENCOUNTER — Other Ambulatory Visit: Payer: Self-pay | Admitting: Family Medicine

## 2021-07-08 DIAGNOSIS — K219 Gastro-esophageal reflux disease without esophagitis: Secondary | ICD-10-CM

## 2021-08-03 DIAGNOSIS — G40802 Other epilepsy, not intractable, without status epilepticus: Secondary | ICD-10-CM | POA: Diagnosis not present

## 2021-08-03 DIAGNOSIS — G809 Cerebral palsy, unspecified: Secondary | ICD-10-CM | POA: Diagnosis not present

## 2021-08-05 ENCOUNTER — Other Ambulatory Visit: Payer: Self-pay

## 2021-08-05 ENCOUNTER — Ambulatory Visit (HOSPITAL_COMMUNITY)
Admission: RE | Admit: 2021-08-05 | Discharge: 2021-08-05 | Disposition: A | Discharge status: Home / Self Care | Payer: Medicaid Other | Source: Ambulatory Visit | Attending: Neurology | Admitting: Neurology

## 2021-08-05 DIAGNOSIS — R569 Unspecified convulsions: Secondary | ICD-10-CM | POA: Insufficient documentation

## 2021-08-05 NOTE — Procedures (Signed)
Patient Name: Darrell Cunningham  MRN: [id number]  Epilepsy Attending: Lora Havens  Referring Physician/Provider: Phillips Odor, MD Date: 08/05/2021 Duration: 22.23 mins  Patient history: 61yo m with new onset seizure like episode. EEG to evaluate for seizure  Level of alertness: Awake  AEDs during EEG study: LEV  Technical aspects: This EEG study was done with scalp electrodes positioned according to the 10-20 International system of electrode placement. Electrical activity was acquired at a sampling rate of 500Hz  and reviewed with a high frequency filter of 70Hz  and a low frequency filter of 1Hz . EEG data were recorded continuously and digitally stored.   Description: The posterior dominant rhythm consists of 9 Hz activity of moderate voltage (25-35 uV) seen predominantly in posterior head regions, symmetric and reactive to eye opening and eye closing. Physiologic photic driving was seen during photic stimulation.  Hyperventilation was not performed.     IMPRESSION: This study is within normal limits. No seizures or epileptiform discharges were seen throughout the recording.  Darrell Cunningham Darrell Cunningham

## 2021-08-05 NOTE — Progress Notes (Signed)
EEG complete - results pending 

## 2021-08-12 ENCOUNTER — Other Ambulatory Visit: Payer: Self-pay | Admitting: Family Medicine

## 2021-09-10 ENCOUNTER — Other Ambulatory Visit: Payer: Self-pay | Admitting: Family Medicine

## 2021-09-10 DIAGNOSIS — K219 Gastro-esophageal reflux disease without esophagitis: Secondary | ICD-10-CM

## 2021-09-19 ENCOUNTER — Other Ambulatory Visit (HOSPITAL_COMMUNITY): Payer: Self-pay | Admitting: Neurology

## 2021-09-19 DIAGNOSIS — G40802 Other epilepsy, not intractable, without status epilepticus: Secondary | ICD-10-CM

## 2021-09-26 ENCOUNTER — Encounter (HOSPITAL_COMMUNITY): Payer: Self-pay

## 2021-09-26 ENCOUNTER — Ambulatory Visit (HOSPITAL_COMMUNITY): Payer: Medicaid Other

## 2021-09-30 ENCOUNTER — Other Ambulatory Visit: Payer: Self-pay | Admitting: Nurse Practitioner

## 2021-09-30 DIAGNOSIS — J301 Allergic rhinitis due to pollen: Secondary | ICD-10-CM

## 2021-10-06 DIAGNOSIS — G40901 Epilepsy, unspecified, not intractable, with status epilepticus: Secondary | ICD-10-CM | POA: Diagnosis not present

## 2021-10-06 DIAGNOSIS — G9389 Other specified disorders of brain: Secondary | ICD-10-CM | POA: Diagnosis not present

## 2021-10-06 DIAGNOSIS — Z8673 Personal history of transient ischemic attack (TIA), and cerebral infarction without residual deficits: Secondary | ICD-10-CM | POA: Diagnosis not present

## 2021-10-12 DIAGNOSIS — G40802 Other epilepsy, not intractable, without status epilepticus: Secondary | ICD-10-CM | POA: Diagnosis not present

## 2021-10-12 DIAGNOSIS — G809 Cerebral palsy, unspecified: Secondary | ICD-10-CM | POA: Diagnosis not present

## 2021-10-15 ENCOUNTER — Other Ambulatory Visit: Payer: Self-pay | Admitting: Hematology

## 2021-10-15 DIAGNOSIS — C155 Malignant neoplasm of lower third of esophagus: Secondary | ICD-10-CM

## 2021-10-17 ENCOUNTER — Other Ambulatory Visit: Payer: Self-pay

## 2021-10-17 DIAGNOSIS — C155 Malignant neoplasm of lower third of esophagus: Secondary | ICD-10-CM

## 2021-10-18 ENCOUNTER — Other Ambulatory Visit: Payer: Self-pay | Admitting: Family Medicine

## 2021-11-08 ENCOUNTER — Telehealth: Payer: Self-pay | Admitting: Hematology

## 2021-11-08 NOTE — Telephone Encounter (Signed)
.  Called pt per 4/11 inbasket , Patient was unavailable, a message with appt time and date was left with number on file.   ?

## 2021-11-12 ENCOUNTER — Other Ambulatory Visit: Payer: Self-pay | Admitting: Family Medicine

## 2021-11-12 DIAGNOSIS — K219 Gastro-esophageal reflux disease without esophagitis: Secondary | ICD-10-CM

## 2021-11-21 NOTE — Progress Notes (Signed)
[place] ?OFFICE PROGRESS NOTE ? ?[name], MD ?[place] [id number] ? ?DIAGNOSIS:  Follow up esophageal adenocarcinoma  ? ?Oncology History Overview Note  ?Cancer Staging ?Esophageal cancer ([place]) ?Staging form: Esophagus - Adenocarcinoma, AJCC 8th Edition ?- Clinical stage from 10/26/2016: Stage III (cT3, cN1, cM0) - Signed by [name], MD on 11/05/2016 ? ? ?  ?Malignant neoplasm of lower third of esophagus ([place])  ?09/21/2016 - 09/21/2016 Hospital Admission  ? esophageal pain and vomiting up blood ?  ?10/09/2016 Initial Diagnosis  ? Esophageal cancer ([place]) ? ?  ?10/09/2016 Procedure  ? EGD ?1. Partially obstructing, likely malignant esophageal tumor was found in the lower third of the esophagus. Multiple biopsies.  ?2. Mass visible during gastric retroflexion at GE junction ?3. Otherwise normal stomach ?4. Normal examined duodenum  ? ?  ?10/09/2016 Pathology Results  ? Esophagus, biopsy, distal esophageal tumor ([id number]) ?- SUSPICIOUS FOR ADENOCARCINOMA ? ?  ?10/12/2016 Imaging  ? CT CAP w Contrast ?IMPRESSION: ?Distal esophageal mass compatible with primary esophageal ?malignancy. There are 2 adjacent abnormal appearing subcentimeter ?paraesophageal lymph nodes which may represent nodal metastasis. ?Additionally there is a prominent nonspecific 8 mm upper abdominal ?lymph node. ?  ?No evidence for distant metastatic disease in the chest, abdomen or ?pelvis. ? ?  ?10/24/2016 PET scan  ? 1. Markedly hypermetabolic distal esophageal lesion, compatible with ?malignancy. Adjacent small paraesophageal lymph nodes are abnormal ?by CT but cannot be resolved as separate structures from the ?hypermetabolic esophageal activity on the PET images. No ?hypermetabolism is demonstrated in the upper abdominal/gastrohepatic ?ligament lymph node although the small size of this lymph node may ?be below threshold for detection on PET imaging. ?2. No evidence for distant hypermetabolic metastatic  disease in the ?neck, chest, abdomen, or pelvis. ? ?  ?10/26/2016 Pathology Results  ? Esophagogastric junction, biopsy, mass ?- INVASIVE ADENOCARCINOMA. ? ?  ?10/26/2016 Procedure  ? EUS showed uT3N1 disease, and biopsy confirmed adenocarcinoma  ? ?  ?10/30/2016 - 12/07/2016 Radiation Therapy  ? Neoadjuvant radiation to esophageal cancer  Under the care of Dr. [name] ? ?  ?10/31/2016 - 12/07/2016 Neo-Adjuvant Chemotherapy  ? Weekly Carboplatin AUC 2 and taxol '50mg'$ /m2 with concurrent radiation  ? ?  ?12/14/2016 Imaging  ? CT AP W Contrast 12/14/16 ?IMPRESSION: ?1. Mildly dilated short segment of proximal jejunum without bowel ?wall thickening or significant inflammatory changes. Findings may ?represent focal ileus or radiation enteritis. No evidence for ?obstruction. ?2. Significantly improved appearance of the distal ?esophagus/proximal stomach with decreased wall thickening (1.1 cm ?from 2.3 cm), and smaller paraesophageal and gastrohepatic ligament ?lymph nodes. ?3. No bowel obstruction.  Normal appendix. ?  ?01/18/2017 Imaging  ? CT CAP W Contrast 01/18/17 ?IMPRESSION: ?1. Mild stable distal esophageal wall thickening likely due to ?radiation change. No findings for recurrent tumor. Small ?paraesophageal lymph nodes are also stable. ?2. No findings for metastatic disease. ?3. Stable mild/early emphysematous changes and age advanced ?atherosclerotic calcifications involving the thoracic and abdominal ?aorta and branch vessels. ?  ?04/12/2017 Imaging  ? CT Chest and Abdomen W Contrast 04/12/17  ?IMPRESSION: ?1. Mild stable distal esophageal wall thickening. No findings for ?recurrent tumor. ?2. Stable small mediastinal and left supraclavicular lymph nodes. ?3. There is a tiny nodule within the medial right lower lobe ?measuring 3 mm. New from previous exam. Nonspecific. Attention on ?follow-up imaging advise. ?4.  Aortic Atherosclerosis (ICD10-I70.0). ?5. Three vessel coronary artery calcification ? ?  ?04/16/2017 Surgery  ?  VIDEO BRONCHOSCOPY, Transhiatal Total Esophagectomy, Esophagogastrostomy, pyloromotomy, Feeding Jejunostomy ?ESOPHAGECTOMY COMPLETE,Transhiatal total esophagectomy ?[place] ?by Dr. [name] 04/16/17 ? ?  ?04/16/2017 Pathology Results  ? Diagnosis 04/16/17  ?1. Omentum, resection for tumor ?- BENIGN ADIPOSE TISSUE CONSISTENT WITH OMENTUM. ?- NO EVIDENCE OF MALIGNANCY. ?2. Esophagus, resection, w/ GE junction ?- FIBROSIS WITH PATCHY CHRONIC INFLAMMATION. ?- NO RESIDUAL CARCINOMA IDENTIFIED. ?- MARGINS NOT INVOLVED. ?- TWELVE LYMPH NODES WITH NO METASTATIC CARCINOMA (0/12). ? ? ?  ?07/16/2017 Procedure  ? Upper GI Endoscopy Findings: per Dr. [name] ?- Food was found in the upper third of the esophagus. Removal of food was accomplished with Jabier Mutton net. ?- A partial esophagectomy anastomosis was found in the upper third of the esophagus (20 cm from incisors). This was characterized by severe stenosis, an intact staple line and visible sutures. The standard adult upper endoscope would not pass before dilation. A TTS dilator ?was passed through the scope. Dilation with an 8.5-9.5-10.5 mm and then 06-11-12 mm balloon dilator was performed to 12 mm (inspection after 9.5 mm, 10.5 mm, 11 mm and 12 mm). The dilation site was examined and showed moderate and significant improvement in ?luminal narrowing. Estimated blood loss was minimal. After dilation to 12 mm the endoscope was able to transverse the anastomosis with minimal pressure. ?- A medium amount of food (residue) was found in the gastric body. ?- The exam of the stomach was otherwise normal. ?- The examined duodenum was normal. ? ?  ?08/13/2017 Procedure  ? Upper GI Endoscopy Findings: per Dr. [name] ?- Food was found in the proximal esophagus. Removal of food was accomplished with Jabier Mutton net. ?- One severe (stenosis; an endoscope cannot pass) benign-appearing, intrinsic stenosis was ?found 20 cm from the incisors. This measured 1 cm (in length) and was traversed  after dilation. A TTS dilator was passed through the scope. Dilation with an 06-11-12 mm balloon dilator was performed to 13 mm (after 4m and 12 mm). The dilation site was examined and showed moderate improvement in luminal narrowing. At the stricture there is a visible staple on the proximal side and visible suture material on the gastric side. ?- A medium amount of food (residue) ?-the duodenum was normal  ? ?  ?08/31/2017 Procedure  ? Endoscopic needle-knife excision [facility] anastomotic strict[facility]? ?  ?09/11/2017 Imaging  ? CT CAP W CONTRAST IMPRESSION: ?1. Interval esophagectomy with gastric pull-through. No demonstrated complication. There is retained ingested material within the intrathoracic portion of the stomach. ?2. No evidence of local recurrence or metastatic disease. ?3. Stable small left supraclavicular and superior mediastinal lymph nodes. ?4. Mild lower lobe paramediastinal pulmonary opacity bilaterally, likely atelectasis or sequela of prior radiation therapy. ?5.  Aortic Atherosclerosis (ICD10-I70.0). ?  ? ?  ?02/28/2018 Procedure  ? 02/28/2018 Upper endoscopy ?Impression ?- Benign-appearing esophageal stenosis. Dilated. ?- A previous surgical anastomosis was found in the proximal stomach. ?- Normal examined duodenum. ?- No specimens collected. ?  ?04/10/2018 Imaging  ? 04/10/2018 CT CA ?IMPRESSION: ?1. No evidence of metastatic disease. ?2. Aortic atherosclerosis (ICD10-170.0). Coronary artery calcification. ?3.  Emphysema (ICD10-J43.9). ?  ?03/20/2019 Imaging  ? CT CAP W contrast 03/20/19  ?IMPRESSION: ?1. Stable exam. No new or progressive interval findings to suggest ?recurrent/metastatic disease. ?2.  Aortic Atherosclerois (ICD10-170.0) ?  ?03/30/2019 Procedure  ? EGD with Dr SFuller Plan8/30/20  ?IMPRESSION ?- Food impaction in the proximal esophagus. Removal was successful. ?- Benign-appearing esophageal stenosis at 20 cm. ?- Ectopic gastric mucosa in the  proximal esophagus. ?- Prior  esophagectomy, gastric pull up. Sutures noted in gastric body. ?- A medium amount of food (residue) in the stomach that obscured visualization. ?- Normal duodenal bulb and second portion of the duodenum. ?  ?10/26/2

## 2021-11-22 ENCOUNTER — Inpatient Hospital Stay: Payer: Medicaid Other | Attending: Physician Assistant

## 2021-11-22 ENCOUNTER — Inpatient Hospital Stay (HOSPITAL_BASED_OUTPATIENT_CLINIC_OR_DEPARTMENT_OTHER): Payer: Medicaid Other | Admitting: Physician Assistant

## 2021-11-22 ENCOUNTER — Other Ambulatory Visit: Payer: Self-pay | Admitting: Physician Assistant

## 2021-11-22 ENCOUNTER — Other Ambulatory Visit: Payer: Self-pay

## 2021-11-22 VITALS — BP 111/71 | HR 97 | Temp 98.1°F | Resp 20 | Wt 128.5 lb

## 2021-11-22 DIAGNOSIS — C155 Malignant neoplasm of lower third of esophagus: Secondary | ICD-10-CM

## 2021-11-22 DIAGNOSIS — K2100 Gastro-esophageal reflux disease with esophagitis, without bleeding: Secondary | ICD-10-CM | POA: Insufficient documentation

## 2021-11-22 DIAGNOSIS — E46 Unspecified protein-calorie malnutrition: Secondary | ICD-10-CM | POA: Insufficient documentation

## 2021-11-22 DIAGNOSIS — Z79899 Other long term (current) drug therapy: Secondary | ICD-10-CM | POA: Insufficient documentation

## 2021-11-22 DIAGNOSIS — R590 Localized enlarged lymph nodes: Secondary | ICD-10-CM | POA: Diagnosis not present

## 2021-11-22 DIAGNOSIS — Z9049 Acquired absence of other specified parts of digestive tract: Secondary | ICD-10-CM | POA: Diagnosis not present

## 2021-11-22 DIAGNOSIS — I2510 Atherosclerotic heart disease of native coronary artery without angina pectoris: Secondary | ICD-10-CM | POA: Insufficient documentation

## 2021-11-22 DIAGNOSIS — I700 Atherosclerosis of aorta: Secondary | ICD-10-CM | POA: Insufficient documentation

## 2021-11-22 DIAGNOSIS — Z880 Allergy status to penicillin: Secondary | ICD-10-CM | POA: Insufficient documentation

## 2021-11-22 DIAGNOSIS — Z87891 Personal history of nicotine dependence: Secondary | ICD-10-CM | POA: Diagnosis not present

## 2021-11-22 LAB — CBC WITH DIFFERENTIAL (CANCER CENTER ONLY)
Abs Immature Granulocytes: 0.01 10*3/uL (ref 0.00–0.07)
Basophils Absolute: 0 10*3/uL (ref 0.0–0.1)
Basophils Relative: 1 %
Eosinophils Absolute: 0.2 10*3/uL (ref 0.0–0.5)
Eosinophils Relative: 3 %
HCT: 40.5 % (ref 39.0–52.0)
Hemoglobin: 13.5 g/dL (ref 13.0–17.0)
Immature Granulocytes: 0 %
Lymphocytes Relative: 27 %
Lymphs Abs: 2 10*3/uL (ref 0.7–4.0)
MCH: 30.6 pg (ref 26.0–34.0)
MCHC: 33.3 g/dL (ref 30.0–36.0)
MCV: 91.8 fL (ref 80.0–100.0)
Monocytes Absolute: 0.5 10*3/uL (ref 0.1–1.0)
Monocytes Relative: 7 %
Neutro Abs: 4.4 10*3/uL (ref 1.7–7.7)
Neutrophils Relative %: 62 %
Platelet Count: 221 10*3/uL (ref 150–400)
RBC: 4.41 MIL/uL (ref 4.22–5.81)
RDW: 13 % (ref 11.5–15.5)
WBC Count: 7.2 10*3/uL (ref 4.0–10.5)
nRBC: 0 % (ref 0.0–0.2)

## 2021-11-22 LAB — CMP (CANCER CENTER ONLY)
ALT: 13 U/L (ref 0–44)
AST: 17 U/L (ref 15–41)
Albumin: 4.1 g/dL (ref 3.5–5.0)
Alkaline Phosphatase: 83 U/L (ref 38–126)
Anion gap: 6 (ref 5–15)
BUN: 16 mg/dL (ref 8–23)
CO2: 28 mmol/L (ref 22–32)
Calcium: 9.1 mg/dL (ref 8.9–10.3)
Chloride: 104 mmol/L (ref 98–111)
Creatinine: 0.77 mg/dL (ref 0.61–1.24)
GFR, Estimated: >60 mL/min (ref >60)
Glucose, Bld: 104 mg/dL — ABNORMAL HIGH (ref 70–99)
Potassium: 3.7 mmol/L (ref 3.5–5.1)
Sodium: 138 mmol/L (ref 135–145)
Total Bilirubin: 0.5 mg/dL (ref 0.3–1.2)
Total Protein: 7.4 g/dL (ref 6.5–8.1)

## 2021-11-23 LAB — CEA (IN HOUSE-CHCC): CEA (CHCC-In House): 1.77 ng/mL (ref 0.00–5.00)

## 2021-12-09 ENCOUNTER — Ambulatory Visit (HOSPITAL_COMMUNITY)
Admission: RE | Admit: 2021-12-09 | Discharge: 2021-12-09 | Disposition: A | Discharge status: Home / Self Care | Payer: Medicaid Other | Source: Ambulatory Visit | Attending: Physician Assistant | Admitting: Physician Assistant

## 2021-12-09 ENCOUNTER — Encounter (HOSPITAL_COMMUNITY): Payer: Self-pay

## 2021-12-09 DIAGNOSIS — I6521 Occlusion and stenosis of right carotid artery: Secondary | ICD-10-CM | POA: Diagnosis not present

## 2021-12-09 DIAGNOSIS — R590 Localized enlarged lymph nodes: Secondary | ICD-10-CM | POA: Insufficient documentation

## 2021-12-09 DIAGNOSIS — J9809 Other diseases of bronchus, not elsewhere classified: Secondary | ICD-10-CM | POA: Diagnosis not present

## 2021-12-09 DIAGNOSIS — I700 Atherosclerosis of aorta: Secondary | ICD-10-CM | POA: Diagnosis not present

## 2021-12-09 DIAGNOSIS — C159 Malignant neoplasm of esophagus, unspecified: Secondary | ICD-10-CM | POA: Diagnosis not present

## 2021-12-09 DIAGNOSIS — C155 Malignant neoplasm of lower third of esophagus: Secondary | ICD-10-CM | POA: Insufficient documentation

## 2021-12-09 MED ORDER — IOHEXOL 300 MG/ML  SOLN
100.0000 mL | Freq: Once | INTRAMUSCULAR | Status: AC | PRN
  Administered 2021-12-09: 100 mL via INTRAVENOUS

## 2021-12-09 MED ORDER — SODIUM CHLORIDE (PF) 0.9 % IJ SOLN
INTRAMUSCULAR | Status: AC
  Filled 2021-12-09: qty 50

## 2021-12-12 ENCOUNTER — Other Ambulatory Visit: Payer: Self-pay | Admitting: Family Medicine

## 2021-12-12 ENCOUNTER — Other Ambulatory Visit: Payer: Self-pay | Admitting: Physician Assistant

## 2021-12-12 DIAGNOSIS — C155 Malignant neoplasm of lower third of esophagus: Secondary | ICD-10-CM

## 2021-12-12 DIAGNOSIS — R590 Localized enlarged lymph nodes: Secondary | ICD-10-CM

## 2021-12-12 NOTE — Progress Notes (Signed)
I spoke to Dr. Burr Medico about his CT scan. His scan noted large heterogenous left level 2A lymph node, consistent with metastatic disease. We will biopsy this. I placed the order for stat. Hopefully we can get the biopsy this week and see the patient back next week to review the results. I will remove the appointment on 12/15/21. The patient feels that this area is enlarging and becoming uncomfortable, especially with moving his neck. He is going to take tylenol. I would be happy to send something stronger such as norco or tramadol if needed. He is going to try tylenol but will call us back if he feels like he needs prescription pain medication. I will plan on seeing him back next week ~5/24. Hopefully we will have the biopsy by that time. He is aware of the above.  ?

## 2021-12-14 ENCOUNTER — Encounter: Payer: Self-pay | Admitting: *Deleted

## 2021-12-14 NOTE — Progress Notes (Unsigned)
Sandi Mariscal, MD  [facility] ?OK for US guided L CX LN Bx.  ? ?Neck CT - image 46, series 4.  ? ?Sedation per pt request.  ? ?Thx,  ?Ulice Dash ?

## 2021-12-14 NOTE — Telephone Encounter (Signed)
Requested Prescriptions  ?Pending Prescriptions Disp Refills  ?? levETIRAcetam (KEPPRA) 500 MG tablet [Pharmacy Med Name: LEVETIRACETAM 500 MG TABLET] 60 tablet 1  ?  Sig: TAKE 1 TABLET BY MOUTH TWICE A DAY  ?  ? Neurology:  Anticonvulsants - levetiracetam Passed - 12/12/2021  8:47 PM  ?  ?  Passed - Cr in normal range and within 360 days  ?  Creatinine  ?Date Value Ref Range Status  ?11/22/2021 0.77 0.61 - 1.24 mg/dL Final  ?06/14/2017 0.7 0.7 - 1.3 mg/dL Final  ?   ?  ?  Passed - Completed PHQ-2 or PHQ-9 in the last 360 days  ?  ?  Passed - Valid encounter within last 12 months  ?  Recent Outpatient Visits   ?      ? 5 months ago New onset seizure [facility])  ? [facility] [place], [name], MD  ? 11 months ago Prostate cancer screening  ? [facility] [place], [name], MD  ? 1 year ago COPD exacerbation [facility])  ? [place], NP  ? 1 year ago Malignant neoplasm of lower third of esophagus ([place])  ? [facility] [place], [name], MD  ? 4 years ago Hypotension due to drugs  ? [facility] [place], [name], MD  ?  ?  ? ?  ?  ?  ? ?

## 2021-12-15 ENCOUNTER — Inpatient Hospital Stay: Payer: Medicaid Other | Admitting: Hematology

## 2021-12-15 ENCOUNTER — Inpatient Hospital Stay: Payer: Medicaid Other

## 2021-12-19 ENCOUNTER — Ambulatory Visit (HOSPITAL_COMMUNITY)
Admission: RE | Admit: 2021-12-19 | Discharge: 2021-12-19 | Disposition: A | Discharge status: Home / Self Care | Payer: Medicaid Other | Source: Ambulatory Visit | Attending: Physician Assistant | Admitting: Physician Assistant

## 2021-12-19 DIAGNOSIS — C159 Malignant neoplasm of esophagus, unspecified: Secondary | ICD-10-CM | POA: Insufficient documentation

## 2021-12-19 DIAGNOSIS — C155 Malignant neoplasm of lower third of esophagus: Secondary | ICD-10-CM

## 2021-12-19 DIAGNOSIS — R590 Localized enlarged lymph nodes: Secondary | ICD-10-CM | POA: Diagnosis not present

## 2021-12-19 DIAGNOSIS — C770 Secondary and unspecified malignant neoplasm of lymph nodes of head, face and neck: Secondary | ICD-10-CM | POA: Diagnosis not present

## 2021-12-19 MED ORDER — LIDOCAINE HCL 1 % IJ SOLN
INTRAMUSCULAR | Status: AC
Start: 2021-12-19 — End: 2021-12-19
  Administered 2021-12-19: 10 mL
  Filled 2021-12-19: qty 20

## 2021-12-20 NOTE — Progress Notes (Unsigned)
[place] OFFICE PROGRESS NOTE  [name], MD [address] [place] [address]  DIAGNOSIS: Follow up left neck node biopsy result   Oncology History Overview Note  Cancer Staging Esophageal cancer [facility]) Staging form: Esophagus - Adenocarcinoma, AJCC 8th Edition - Clinical stage from 10/26/2016: Stage III (cT3, cN1, cM0) - Signed by [name], MD on 11/05/2016     Malignant neoplasm of lower third of esophagus ([place])  09/21/2016 - 09/21/2016 Hospital Admission   esophageal pain and vomiting up blood   10/09/2016 Initial Diagnosis   Esophageal cancer ([place])    10/09/2016 Procedure   EGD 1. Partially obstructing, likely malignant esophageal tumor was found in the lower third of the esophagus. Multiple biopsies.  2. Mass visible during gastric retroflexion at GE junction 3. Otherwise normal stomach 4. Normal examined duodenum     10/09/2016 Pathology Results   Esophagus, biopsy, distal esophageal tumor ([id number]) - SUSPICIOUS FOR ADENOCARCINOMA    10/12/2016 Imaging   CT CAP w Contrast IMPRESSION: Distal esophageal mass compatible with primary esophageal malignancy. There are 2 adjacent abnormal appearing subcentimeter paraesophageal lymph nodes which may represent nodal metastasis. Additionally there is a prominent nonspecific 8 mm upper abdominal lymph node.   No evidence for distant metastatic disease in the chest, abdomen or pelvis.    10/24/2016 PET scan   1. Markedly hypermetabolic distal esophageal lesion, compatible with malignancy. Adjacent small paraesophageal lymph nodes are abnormal by CT but cannot be resolved as separate structures from the hypermetabolic esophageal activity on the PET images. No hypermetabolism is demonstrated in the upper abdominal/gastrohepatic ligament lymph node although the small size of this lymph node may be below threshold for detection on PET imaging. 2. No evidence for distant hypermetabolic metastatic  disease in the neck, chest, abdomen, or pelvis.    10/26/2016 Pathology Results   Esophagogastric junction, biopsy, mass - INVASIVE ADENOCARCINOMA.    10/26/2016 Procedure   EUS showed uT3N1 disease, and biopsy confirmed adenocarcinoma     10/30/2016 - 12/07/2016 Radiation Therapy   Neoadjuvant radiation to esophageal cancer  Under the care of Dr. [name]    10/31/2016 - 12/07/2016 Neo-Adjuvant Chemotherapy   Weekly Carboplatin AUC 2 and taxol '50mg'$ /m2 with concurrent radiation     12/14/2016 Imaging   CT AP W Contrast 12/14/16 IMPRESSION: 1. Mildly dilated short segment of proximal jejunum without bowel wall thickening or significant inflammatory changes. Findings may represent focal ileus or radiation enteritis. No evidence for obstruction. 2. Significantly improved appearance of the distal esophagus/proximal stomach with decreased wall thickening (1.1 cm from 2.3 cm), and smaller paraesophageal and gastrohepatic ligament lymph nodes. 3. No bowel obstruction.  Normal appendix.   01/18/2017 Imaging   CT CAP W Contrast 01/18/17 IMPRESSION: 1. Mild stable distal esophageal wall thickening likely due to radiation change. No findings for recurrent tumor. Small paraesophageal lymph nodes are also stable. 2. No findings for metastatic disease. 3. Stable mild/early emphysematous changes and age advanced atherosclerotic calcifications involving the thoracic and abdominal aorta and branch vessels.   04/12/2017 Imaging   CT Chest and Abdomen W Contrast 04/12/17  IMPRESSION: 1. Mild stable distal esophageal wall thickening. No findings for recurrent tumor. 2. Stable small mediastinal and left supraclavicular lymph nodes. 3. There is a tiny nodule within the medial right lower lobe measuring 3 mm. New from previous exam. Nonspecific. Attention on follow-up imaging advise. 4.  Aortic Atherosclerosis (ICD10-I70.0). 5. Three vessel coronary artery calcification    04/16/2017 Surgery  VIDEO BRONCHOSCOPY, Transhiatal Total Esophagectomy, Esophagogastrostomy, pyloromotomy, Feeding Jejunostomy ESOPHAGECTOMY COMPLETE,Transhiatal total esophagectomy JEJUNOSTOMY,Feeding by Dr. [name] 04/16/17    04/16/2017 Pathology Results   Diagnosis 04/16/17  1. Omentum, resection for tumor - BENIGN ADIPOSE TISSUE CONSISTENT WITH OMENTUM. - NO EVIDENCE OF MALIGNANCY. 2. Esophagus, resection, w/ GE junction - FIBROSIS WITH PATCHY CHRONIC INFLAMMATION. - NO RESIDUAL CARCINOMA IDENTIFIED. - MARGINS NOT INVOLVED. - TWELVE LYMPH NODES WITH NO METASTATIC CARCINOMA (0/12).     07/16/2017 Procedure   Upper GI Endoscopy Findings: per Dr. [name] - Food was found in the upper third of the esophagus. Removal of food was accomplished with Jabier Mutton net. - A partial esophagectomy anastomosis was found in the upper third of the esophagus (20 cm from incisors). This was characterized by severe stenosis, an intact staple line and visible sutures. The standard adult upper endoscope would not pass before dilation. A TTS dilator was passed through the scope. Dilation with an 8.5-9.5-10.5 mm and then 06-11-12 mm balloon dilator was performed to 12 mm (inspection after 9.5 mm, 10.5 mm, 11 mm and 12 mm). The dilation site was examined and showed moderate and significant improvement in luminal narrowing. Estimated blood loss was minimal. After dilation to 12 mm the endoscope was able to transverse the anastomosis with minimal pressure. - A medium amount of food (residue) was found in the gastric body. - The exam of the stomach was otherwise normal. - The examined duodenum was normal.    08/13/2017 Procedure   Upper GI Endoscopy Findings: per Dr. [name] - Food was found in the proximal esophagus. Removal of food was accomplished with Jabier Mutton net. - One severe (stenosis; an endoscope cannot pass) benign-appearing, intrinsic stenosis was found 20 cm from the incisors. This measured 1 cm (in length) and was traversed  after dilation. A TTS dilator was passed through the scope. Dilation with an 06-11-12 mm balloon dilator was performed to 13 mm (after 40m and 12 mm). The dilation site was examined and showed moderate improvement in luminal narrowing. At the stricture there is a visible staple on the proximal side and visible suture material on the gastric side. - A medium amount of food (residue) -the duodenum was normal     08/31/2017 Procedure   Endoscopic [facility]knife [facility]   09/11/2017 Imaging   CT CAP W CONTRAST IMPRESSION: 1. Interval esophagectomy with gastric pull-through. No demonstrated complication. There is retained ingested material within the intrathoracic portion of the stomach. 2. No evidence of local recurrence or metastatic disease. 3. Stable small left supraclavicular and superior mediastinal lymph nodes. 4. Mild lower lobe paramediastinal pulmonary opacity bilaterally, likely atelectasis or sequela of prior radiation therapy. 5.  Aortic Atherosclerosis (ICD10-I70.0).      02/28/2018 Procedure   02/28/2018 Upper endoscopy Impression - Benign-appearing esophageal stenosis. Dilated. - A previous surgical anastomosis was found in the proximal stomach. - Normal examined duodenum. - No specimens collected.   04/10/2018 Imaging   04/10/2018 CT CA IMPRESSION: 1. No evidence of metastatic disease. 2. Aortic atherosclerosis (ICD10-170.0). Coronary artery calcification. 3.  Emphysema (ICD10-J43.9).   03/20/2019 Imaging   CT CAP W contrast 03/20/19  IMPRESSION: 1. Stable exam. No new or progressive interval findings to suggest recurrent/metastatic disease. 2.  Aortic Atherosclerois (ICD10-170.0)   03/30/2019 Procedure   EGD with Dr SFuller Plan8/30/20  IMPRESSION - Food impaction in the proximal esophagus. Removal was successful. 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 - Ectopic gastric mucosa in the  proximal esophagus. - Prior  esophagectomy, gastric pull up. Sutures noted in gastric body. - A medium amount of food (residue) in the stomach that obscured visualization. - Normal duodenal bulb and second portion of the duodenum.   05/26/2019 Procedure   Upper Endoscopy by Dr. [name] 05/26/19  IMPRESSION - Benign-appearing esophageal stenosis at anastomosis. Dilated to 19 mm with balloon. Injected with steroid. 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 - Normal examined duodenum. - No specimens collected.   03/09/2020 Imaging   CT CAP w contrast  IMPRESSION: Stable exam. No evidence of recurrent or metastatic carcinoma within the chest, abdomen, or pelvis.   Aortic Atherosclerosis (ICD10-I70.0). Coronary artery atherosclerosis.   06/03/2021 Procedure   Dr. [name]  IMPRESSION: An esophagogastric anastomosis was found without esophagitis or recurrent malignancy. Dilated to 20 mm with balloon. 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 - Normal examined duodenum. - No specimens collected.   Head and neck cancer ([place])  12/09/2021 Imaging   CT Soft Tissue Neck W Contrast  IMPRESSION: 1. Large, heterogeneous left level 2A lymph node, consistent with metastatic disease. 2. Hyperenhancing appearance of the salivary glands may be a sequela of radiation treatment. 3. Please see the report of the CT of the chest, abdomen and pelvis performed the same day for evaluation of findings below the thoracic inlet.   12/09/2021 Imaging   CT Chest W Contrast, Abdomen, and pelvis   IMPRESSION: 1. Status post pull-through esophagectomy. 2. No evidence of metastatic disease within the chest, abdomen, or pelvis. Specifically, no additional lymphadenopathy identified. 3. Mild, diffuse bilateral bronchial wall thickening and background of fine centrilobular pulmonary nodularity, most commonly seen in smoking-related respiratory bronchiolitis. 4. Coronary artery disease.      12/19/2021 Procedure    [place] CORE BIOPSY     12/19/2021 Pathology Results   CASE: [id number]  A. 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     12/22/2021 Initial Diagnosis   Head and neck cancer ([place])     CURRENT THERAPY: Pending workup for newly diagnosed head/neck squamous cell carcinoma   INTERVAL HISTORY: Darrell Cunningham 61 y.o. male returns to the clinic today for a follow up visit accompanied by his daughter. The patient was last seen in the clinic by Dr. Burr Medico on 11/22/21. To review his history, the patient was diagnosed with stage III esophageal cancer in 2018. The patient has been on observation for several years and feeling fairly well until about 2 months ago. More recently, the patient reported lymphadenopathy in the left cervical region. The area was not initially erythematous or sore in general to palpation. The patient denies any recent travel, tick bites, cat scratches, recent infections. Particularly, he denies recent URI, sinus infections, dental infections (he has dentures), skin infections, sore throat, or shortness of breath. He denies any other palpable adenopathy. He denies any oral lesions. He is a former smoker having quit in 2014. Denies ever chewing tobacco. He has dentures.   The patient is followed closely by Dr. [name] from gastroenterology and  his most recent EGD was in November 2022 which did not show any evidence of disease recurrence at that time.  The patient reports intermittent dysphagia  sometimes "regurgitates" foods since undergoing surgery if he overeats. He denies any changes with this recently. He sometimes has left sided abdominal discomfort if he moves a certain way since the surgery but denies any changes with this recently. Denies any nausea, vomiting, diarrhea, or constipation. Denies any fever, chills, or night sweats.  He denies any recent weight loss and has been stable around 128 lbs for several months.  A restaging CT scan was performed. His CAP did not show any concern for disease progression from his esophageal cancer, however, the left cervical node is concerning for metastatic nodal disease. Therefore, this was biopsied on 12/19/21. He is here for evaluation and to review the pathology results and for a more detailed discussion about his current condition and recommended treatment options.   Since last being seen on 11/22/2021 of surgical lymph node has significantly enlarged.  The area is sore since having a biopsy.  It is now causing significant discomfort when the patient tries to move his neck.  He has not tried taking any Tylenol for this.  He is not sure what he was able to take with his seizure medicine.  I offered pain medication but he would like to try Tylenol for now.  He thinks the adenopathy is pressing on his esophagus and he occasionally feels like his throat is irritated her food is getting stuck.  He also reports that he is having more phlegm and mucus production/cough compared to his baseline over the last 3 weeks or so.  He has not tried taking anything for cough.  His COPD is managed by his PCP.  He is compliant with his inhalers.   MEDICAL HISTORY: Past Medical History:  Diagnosis Date   Allergy    Arthritis    Asthma    as a child   Cancer ([place]) 10/09/2016   ESOPHAGUS CARCINOMA    COPD (chronic obstructive pulmonary disease) (HCC)    CP (cerebral palsy), spastic (HCC)    right   Dysphagia    Dyspnea    with exersion   Emphysema of lung  ([place])    Encounter for nonprocreative genetic counseling 10/31/2016   Mr. Farias underwent genetic counseling for hereditary cancer syndromes on 10/31/2016. Though he is a candidate for genetic testing, he declines at this time.   GERD (gastroesophageal reflux disease)    History of chemotherapy    03-2017   History of radiation therapy    03-2017   Hypertension    Neuromuscular disorder (HCC)    [name]   Pneumonia 4 yrs ago    ALLERGIES:  is allergic to penicillins.  MEDICATIONS:  Current Outpatient Medications  Medication Sig Dispense Refill   benzonatate (TESSALON) 100 MG capsule Take 1 capsule (100 mg total) by mouth 3 (three) times daily as needed for cough. 30 capsule 2   acetaminophen (TYLENOL) 500 MG tablet Take 500 mg by mouth every 6 (six) hours as needed.     albuterol (VENTOLIN HFA) 108 (90 Base) MCG/ACT inhaler Inhale 2 puffs into the lungs every 6 (six) hours as needed for wheezing or shortness of breath. 8 g 3   atorvastatin (LIPITOR) 20 MG tablet TAKE 1 TABLET BY MOUTH EVERY DAY 90 tablet 3   Budeson-Glycopyrrol-Formoterol (BREZTRI AEROSPHERE) 160-9-4.8 MCG/ACT AERO Inhale 2 puffs into the lungs 2 (two) times daily. 10.7 g  11   cyclobenzaprine (FLEXERIL) 5 MG tablet Take 1 tablet (5 mg total) by mouth 3 (three) times daily as needed for muscle spasms. 30 tablet 0   fluticasone (FLONASE) 50 MCG/ACT nasal spray SPRAY 1 SPRAY INTO EACH NOSTRIL EVERY DAY AS NEEDED FOR ALLERGIES/RHINITIS 16 mL 2   levETIRAcetam (KEPPRA) 500 MG tablet TAKE 1 TABLET BY MOUTH TWICE A DAY 60 tablet 1   pantoprazole (PROTONIX) 40 MG tablet TAKE 1 TABLET BY MOUTH TWICE A DAY 60 tablet 0   rOPINIRole (REQUIP) 1 MG tablet Take 1 tablet (1 mg total) by mouth at bedtime. 30 tablet 5   No current facility-administered medications for this visit.    SURGICAL HISTORY:  Past Surgical History:  Procedure Laterality Date   COLONOSCOPY     COMPLETE ESOPHAGECTOMY N/A 04/16/2017   Procedure:  ESOPHAGECTOMY COMPLETE,Transhiatal total esophagectomy;  Surgeon: [name], MD;  Location: [facility] OR;  Service: Thoracic;  Laterality: N/A;   ESOPHAGOGASTRODUODENOSCOPY (EGD) WITH PROPOFOL N/A 03/30/2019   Procedure: ESOPHAGOGASTRODUODENOSCOPY (EGD) WITH PROPOFOL;  Surgeon: Ladene Artist, MD;  Location: [facility] ENDOSCOPY;  Service: Endoscopy;  Laterality: N/A;   EUS N/A 10/26/2016   Procedure: UPPER ENDOSCOPIC ULTRASOUND (EUS) LINEAR;  Surgeon: [name], MD;  Location: [facility] ENDOSCOPY;  Service: Endoscopy;  Laterality: N/A;   EYE SURGERY Bilateral age 89    for cross eyes   FOREIGN BODY REMOVAL  03/30/2019   Procedure: FOREIGN BODY REMOVAL;  Surgeon: Ladene Artist, MD;  Location: [place];  Service: Endoscopy;;   IR FLUORO GUIDE PORT INSERTION RIGHT  11/08/2016   right upper chest   IR REMOVAL TUN ACCESS W/ PORT W/O FL MOD SED  04/12/2020   IR US GUIDE VASC ACCESS RIGHT  11/08/2016   JEJUNOSTOMY N/A 04/16/2017   Procedure: Donney Rankins;  Surgeon: [name], MD;  Location: [place];  Service: Thoracic;  Laterality: N/A;   JEJUNOSTOMY     removal of feeding tube /pt unsure of date   [facility] ENDOSCOPY  06/03/2021   hx of esoph ca   VIDEO BRONCHOSCOPY N/A 04/16/2017   Procedure: VIDEO BRONCHOSCOPY, Transhiatal Total Esophagectomy, Esophagogastrostomy, pyloromotomy, Feeding Jejunostomy;  Surgeon: [name], MD;  Location: [facility] OR;  Service: Thoracic;  Laterality: N/A;    REVIEW OF SYSTEMS:   Constitutional: Negative for appetite change, chills, fatigue, fever and unexpected weight change.  HENT: Positive for occasional dysphagia since surgery. Negative for mouth sores, nosebleeds, sore throat . Eyes: Negative for eye problems and icterus.  Respiratory: Positive for stable dyspnea on exertion and slightly worsening chronic cough.  Negative for hemoptysis and wheezing.    Cardiovascular: Negative for chest pain and leg swelling.  Gastrointestinal: Positive for mild intermittent LUQ pain with certain movements. Positive for occasional dysphagia/"regurgitation". Negative for constipation, diarrhea, nausea and vomiting.  Genitourinary: Negative for bladder incontinence, difficulty urinating, dysuria, frequency and hematuria.   Musculoskeletal: Negative for back pain, gait problem, neck pain and neck stiffness.  Skin: Negative for itching and rash.  Neurological: Negative for dizziness, extremity weakness, gait problem, headaches, light-headedness and seizures.  Hematological: Positive for left cervical lymph node enlargement. Does not bruise/bleed easily.  Psychiatric/Behavioral: Negative for confusion, depression and sleep disturbance. The patient is not nervous/anxious.    PHYSICAL EXAMINATION:  Blood pressure 126/75, pulse 89, temperature 97.7 F (36.5 C), temperature source Temporal, resp. rate 17, height '4\' 11"'$  (  1.499 m), weight 126 lb 12.8 oz (57.5 kg), SpO2 99 %.  ECOG PERFORMANCE STATUS: 1  Physical Exam  Constitutional: Oriented to person, place, and time and well-developed, well-nourished, and in no distress.  HENT:  Head: Normocephalic and atraumatic.  Mouth/Throat: Oropharynx is clear and moist. No oropharyngeal exudate.  Eyes: Conjunctivae are normal. Right eye exhibits no discharge. Left eye exhibits no discharge. No scleral icterus.  Neck: Normal range of motion. Neck supple.  Cardiovascular: Normal rate, regular rhythm, normal heart sounds and intact distal pulses.   Pulmonary/Chest: Effort normal and breath sounds normal. No respiratory distress. No wheezes. No rales.  Abdominal: Soft. Bowel sounds are normal. Exhibits no distension and no mass. There is no tenderness.  Musculoskeletal: Normal range of motion. Exhibits no edema.  Lymphadenopathy:  Large left cervical adenopathy Neurological: Alert and oriented to person, place, and time.  Exhibits normal muscle tone. Gait normal. Coordination normal.  Skin: Skin is warm and dry. No rash noted. Not diaphoretic. No erythema. No pallor.  Psychiatric: Mood, memory and judgment normal.  Vitals reviewed.  LABORATORY DATA: Lab Results  Component Value Date   WBC 7.2 11/22/2021   HGB 13.5 11/22/2021   HCT 40.5 11/22/2021   MCV 91.8 11/22/2021   PLT 221 11/22/2021      Chemistry      Component Value Date/Time   NA 138 11/22/2021 1456   NA 137 06/14/2017 0840   K 3.7 11/22/2021 1456   K 3.9 06/14/2017 0840   CL 104 11/22/2021 1456   CO2 28 11/22/2021 1456   CO2 22 06/14/2017 0840   BUN 16 11/22/2021 1456   BUN 10.2 06/14/2017 0840   CREATININE 0.77 11/22/2021 1456   CREATININE 0.7 06/14/2017 0840      Component Value Date/Time   CALCIUM 9.1 11/22/2021 1456   CALCIUM 9.5 06/14/2017 0840   ALKPHOS 83 11/22/2021 1456   ALKPHOS 132 06/14/2017 0840   AST 17 11/22/2021 1456   AST 22 06/14/2017 0840   ALT 13 11/22/2021 1456   ALT 26 06/14/2017 0840   BILITOT 0.5 11/22/2021 1456   BILITOT 0.38 06/14/2017 0840       RADIOGRAPHIC STUDIES:  CT Soft Tissue Neck W Contrast  Result Date: 12/11/2021 CLINICAL DATA:  Stage 3 esophageal carcinoma. Malignant neoplasm of lower third of esophagus. Cervical adenopathy. EXAM: CT NECK WITH CONTRAST TECHNIQUE: Multidetector CT imaging of the neck was performed using the standard protocol following the bolus administration of intravenous contrast. RADIATION DOSE REDUCTION: This exam was performed according to the departmental dose-optimization program which includes automated exposure control, adjustment of the mA and/or kV according to patient size and/or use of iterative reconstruction technique. CONTRAST:  116m OMNIPAQUE IOHEXOL 300 MG/ML  SOLN COMPARISON:  None Available. FINDINGS: Pharynx and larynx: No pharyngeal or laryngeal abnormality. The cervical esophagus is unremarkable. Salivary glands: Hyperenhancing appearance of the  salivary glands may be a sequela of radiation treatment. Thyroid: Normal Lymph nodes: There is a large, heterogeneous left level 2A lymph node measuring 1.9 cm. There are no other enlarged or abnormal density cervical lymph nodes. Vascular: Mild atherosclerotic calcification of the right carotid bifurcation. Limited intracranial: Normal Visualized orbits: Normal Mastoids and visualized paranasal sinuses: Clear Skeleton: No osseous lesions. IMPRESSION: 1. Large, heterogeneous left level 2A lymph node, consistent with metastatic disease. 2. Hyperenhancing appearance of the salivary glands may be a sequela of radiation treatment. 3. Please see the report of the CT of the chest, abdomen and pelvis performed the same  day for evaluation of findings below the thoracic inlet. Electronically Signed   By: Ulyses Jarred M.D.   On: 12/11/2021 20:05   CT Chest W Contrast  Result Date: 12/12/2021 CLINICAL DATA:  Esophageal cancer, restaging, newly enlarged cervical lymph nodes, status post chemotherapy and XRT * Tracking Code: BO * EXAM: CT CHEST, ABDOMEN, AND PELVIS WITH CONTRAST TECHNIQUE: Multidetector CT imaging of the chest, abdomen and pelvis was performed following the standard protocol during bolus administration of intravenous contrast. RADIATION DOSE REDUCTION: This exam was performed according to the departmental dose-optimization program which includes automated exposure control, adjustment of the mA and/or kV according to patient size and/or use of iterative reconstruction technique. CONTRAST:  182m OMNIPAQUE IOHEXOL 300 MG/ML SOLN, additional oral enteric contrast COMPARISON:  03/09/2020 FINDINGS: CT CHEST FINDINGS Cardiovascular: Aortic atherosclerosis. Normal heart size. Three-vessel coronary artery calcifications. Trace pericardial effusion. Mediastinum/Nodes: No enlarged mediastinal, hilar, or axillary lymph nodes. Status post pull-through esophagectomy. Hiatal hernia containing transverse colon and  pancreas. Thyroid gland and trachea demonstrate no significant findings. Lungs/Pleura: Mild, diffuse bilateral bronchial wall thickening. Background of fine centrilobular pulmonary nodularity, most concentrated in the lung apices. No pleural effusion or pneumothorax. Musculoskeletal: No chest wall mass or suspicious osseous lesions identified. CT ABDOMEN PELVIS FINDINGS Hepatobiliary: No solid liver abnormality is seen. No gallstones, gallbladder wall thickening, or biliary dilatation. Pancreas: Unremarkable. No pancreatic ductal dilatation or surrounding inflammatory changes. Spleen: Normal in size without significant abnormality. Adrenals/Urinary Tract: Adrenal glands are unremarkable. Kidneys are normal, without renal calculi, solid lesion, or hydronephrosis. Bladder is unremarkable. Stomach/Bowel: Stomach is within normal limits. Appendix appears normal. No evidence of bowel wall thickening, distention, or inflammatory changes. Descending and sigmoid diverticulosis. Vascular/Lymphatic: Aortic atherosclerosis. No enlarged abdominal or pelvic lymph nodes. Reproductive: No mass or other abnormality. Other: No abdominal wall hernia or abnormality. No ascites. Musculoskeletal: No acute osseous findings. IMPRESSION: 1. Status post pull-through esophagectomy. 2. No evidence of metastatic disease within the chest, abdomen, or pelvis. Specifically, no additional lymphadenopathy identified. 3. Mild, diffuse bilateral bronchial wall thickening and background of fine centrilobular pulmonary nodularity, most commonly seen in smoking-related respiratory bronchiolitis. 4. Coronary artery disease. Aortic Atherosclerosis (ICD10-I70.0). Electronically Signed   By: ADelanna AhmadiM.D.   On: 12/12/2021 14:34   CT Abdomen Pelvis W Contrast  Result Date: 12/12/2021 CLINICAL DATA:  Esophageal cancer, restaging, newly enlarged cervical lymph nodes, status post chemotherapy and XRT * Tracking Code: BO * EXAM: CT CHEST, ABDOMEN, AND  PELVIS WITH CONTRAST TECHNIQUE: Multidetector CT imaging of the chest, abdomen and pelvis was performed following the standard protocol during bolus administration of intravenous contrast. RADIATION DOSE REDUCTION: This exam was performed according to the departmental dose-optimization program which includes automated exposure control, adjustment of the mA and/or kV according to patient size and/or use of iterative reconstruction technique. CONTRAST:  1063mOMNIPAQUE IOHEXOL 300 MG/ML SOLN, additional oral enteric contrast COMPARISON:  03/09/2020 FINDINGS: CT CHEST FINDINGS Cardiovascular: Aortic atherosclerosis. Normal heart size. Three-vessel coronary artery calcifications. Trace pericardial effusion. Mediastinum/Nodes: No enlarged mediastinal, hilar, or axillary lymph nodes. Status post pull-through esophagectomy. Hiatal hernia containing transverse colon and pancreas. Thyroid gland and trachea demonstrate no significant findings. Lungs/Pleura: Mild, diffuse bilateral bronchial wall thickening. Background of fine centrilobular pulmonary nodularity, most concentrated in the lung apices. No pleural effusion or pneumothorax. Musculoskeletal: No chest wall mass or suspicious osseous lesions identified. CT ABDOMEN PELVIS FINDINGS Hepatobiliary: No solid liver abnormality is seen. No gallstones, gallbladder wall thickening, or biliary dilatation. Pancreas: Unremarkable. No pancreatic  ductal dilatation or surrounding inflammatory changes. Spleen: Normal in size without significant abnormality. Adrenals/Urinary Tract: Adrenal glands are unremarkable. Kidneys are normal, without renal calculi, solid lesion, or hydronephrosis. Bladder is unremarkable. Stomach/Bowel: Stomach is within normal limits. Appendix appears normal. No evidence of bowel wall thickening, distention, or inflammatory changes. Descending and sigmoid diverticulosis. Vascular/Lymphatic: Aortic atherosclerosis. No enlarged abdominal or pelvic lymph nodes.  Reproductive: No mass or other abnormality. Other: No abdominal wall hernia or abnormality. No ascites. Musculoskeletal: No acute osseous findings. IMPRESSION: 1. Status post pull-through esophagectomy. 2. No evidence of metastatic disease within the chest, abdomen, or pelvis. Specifically, no additional lymphadenopathy identified. 3. Mild, diffuse bilateral bronchial wall thickening and background of fine centrilobular pulmonary nodularity, most commonly seen in smoking-related respiratory bronchiolitis. 4. Coronary artery disease. Aortic Atherosclerosis (ICD10-I70.0). Electronically Signed   By: Delanna Ahmadi M.D.   On: 12/12/2021 14:34   [place] CORE BIOPSY (LYMPH NODES)  Result Date: 12/19/2021 INDICATION: Enlarged cervical lymph node, history of esophageal cancer EXAM: Ultrasound-guided core needle biopsy of left cervical lymph node MEDICATIONS: None. ANESTHESIA/SEDATION: Local analgesia COMPLICATIONS: None immediate. PROCEDURE: Informed written consent was obtained from the patient after a thorough discussion of the procedural risks, benefits and alternatives. All questions were addressed. Maximal Sterile Barrier Technique was utilized including caps, mask, sterile gowns, sterile gloves, sterile drape, hand hygiene and skin antiseptic. A timeout was performed prior to the initiation of the procedure. The patient was placed supine on the exam table. Limited ultrasound of the left neck was performed for planning purposes. This again demonstrated an enlarged left cervical lymph node, consistent with recent CT of the neck. Skin entry site was marked, and the overlying skin was prepped and draped in the standard sterile fashion. Local analgesia was obtained with 1% lidocaine. Under ultrasound guidance, core needle biopsy was performed of the target left cervical lymph node using an 18 gauge core biopsy device x5 passes. Specimens were submitted in saline to pathology for further handling. Limited postprocedure  imaging demonstrated no enlarging hematoma. Clean dressing was placed after hemostasis. The patient tolerated the procedure well without immediate complication. IMPRESSION: Successful ultrasound-guided core needle biopsy of enlarged left cervical lymph node. Electronically Signed   By: Albin Felling M.D.   On: 12/19/2021 14:48     ASSESSMENT/PLAN:  MICHAIL BOYTE is a 61 y.o. male with    1. Low esophageal Adenocarcinoma, cT3N1M0, stage III, ypT0N0  -He was diagnosed in 09/2016. He is s/p neoadjuvant ChemoRT and esophagectomy. he achieved complete pathological response to neoadjuvant therapy  -His risk of recurrence is low. Dr. Burr Medico recommended cancer surveillance for 5 years. -He has had multiple EGD for dilatation. His last EGD with Dr [name] in 05/2021 was benign. Last CT CAP in 02/2020 was NED.  - Labs reviewed, CBC and CMP WNL.  -Patient endorsing new left cervical adenopathy that has been present for about 2 months from today's appointment. No other adenopathy appreciated on exam. No recent infections, tick bites, cat scratches, sick contacts, or travel.  -He had a CT of the neck, chest, abdomen/pelvis to assess for local recurrence. CT of the neck was suspicious for nodal metastasis. His salivary glands were noted to me abnormal but felt to be sequela of radiation treatment. The left cervical lymph node was biopsied and is consistent with squamous cell carcinoma. Therefore, this is not disease recurrence from his previous cancer. Otherwise, no other signs of disease in CAP.   -Presently, no evidence of disease recurrence from his prior  esophageal adenocarcinoma.   2. 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  -The patient endorsed a new left cervical lymph node enlarged in March 2023 -CT of the neck noted large heterogeneous left level 2A lymph node consistent with metastatic disease.  There is also hyperenhancing appearance of the salivary glands which was reported to be  sequela of radiation treatment most likely -The patient underwent a ultrasound-guided biopsy of this lymph node and the pathology was consistent with keratinizing squamous cell carcinoma. This was also noted to be p16 positive.  -The patient was seen with Dr. Burr Medico today.  Dr. Burr Medico had a lengthy discussion with the patient today about his current condition and treatment options. -Dr. Burr Medico discussed with the patient that this is likely a second primary malignancy and is not related to his history of esophageal cancer. -She discussed that this malignancy is likely head and neck in origin. Dr. Burr Medico also discussed that generally, p16 positive tends to be oropharyngeal in origin. No clear oral lesion was identified on exam today. The CT of the neck also did not appreciate the primary lesion. Therefore, we also placed a referral to ENT for further evaluation.  -I have reached out to the head and neck nurse navigator to have this patient discussed that the head and neck cancer conference on 01/04/22. -I will arrange for PET scan to identify the primary lesion and to complete staging.   -Dr. Burr Medico discussed at this point in time, it appears to be locally advanced. Treatment will likely involve chemo/radiation; however, we will wait until he completes the staging workup before finalizing the treatment plan.  -We have placed the referral to Dr. Isidore Moos in radiation oncology.  -Dr. Burr Medico is going to check with Dr. Chryl Heck who specializes in head and neck cancer to see if she would be willing to assume the care of this patient -Navigation will keep on eye out on his appointments and arrange follow up with medical oncology after PET scan and ENT evaluation.  -Referrals faxed to ENT on 12/21/21.  -The patient does not like taking medications. He will take tylenol if needed for the discomfort with moving his neck from the cervical lymph node. I encouraged him to please call me if he requires stronger pain medication. We  discussed I would likely recommend norco if he required stronger pain medication.      2. Malnutrition and weight loss, Esophageal stricture  -His nutrition and eating has much improved. He continues to try not to over eat which can lead to vomiting.  -He has had multiple esophageal stretch. He had esophageal obstruction due to food impaction in 03/2019.  -Last EGD in 05/2021. There was no evidence of diseases recurrence.  -He was previously encouraged him to continue nutritional supplement -His weight has been stable for the last several months at 127 lbs.  -S/p treatment and surgery he still has productive cough and will spit out phlegm as needed.      3. History of heavy smoking -He has quit smoking completely in 2014, history of 40 pack year since age 71.  -PCP manages his COPD. Compliant with inhalers. Tessalon was prescribed for his cough. If unable to pick this up due to expenses, then discussed other OTC medications include delsym or robitussin. May also take mucinex for phlegm.      Plan: -Referral to ENT to assess for primary lesion  -Discuss at cancer conference on 01/04/22 -Order PET scan (stat) -Referral to Dr. Isidore Moos from radiation  oncology -Check with Dr. Chryl Heck to transfer care given new head/neck cancer diagnosis.  -Advised him to please let us know if he ever needs assistance with transportation to his appointments in the future.    Orders Placed This Encounter  Procedures   NM PET Image Initial (PI) Skull Base To Thigh    Standing Status:   Future    Standing Expiration Date:   12/22/2022    Scheduling Instructions:     Willing to go to other location to get done ASAP    Order Specific Question:   If indicated for the ordered procedure, I authorize the administration of a radiopharmaceutical per Radiology protocol    Answer:   Yes    Order Specific Question:   Preferred imaging location?    Answer:   [facility]   Ambulatory referral to Radiation Oncology     Referral Priority:   Routine    Referral Type:   Consultation    Referral Reason:   Specialty Services Required    Requested Specialty:   Radiation Oncology    Number of Visits Requested:   [place], PA-C 12/22/21  Addendum I have seen the patient, examined him. I agree with the assessment and and plan and have edited the notes.   Mr. Knupp unfortunately developed a new head neck cancer with left cervical adenopathy.  The primary is unknown at this point, her oral exam was unremarkable today.  Given the p16 positive disease, this is likely oropharyngeal or nasopharyngeal primary.  CT scan was negative for distant metastasis.  We will order a PET scan for staging and looking for primary site.  We will refer her to ENT, radiation oncologist Dr. Isidore Moos.  I will transfer his care to my partner Dr. Chryl Heck who is specialized in head neck cancer treatment.  I briefly discussed the role of concurrent chemoradiation, he is open to the treatment we recommend.  All questions were answered.  [name]  12/22/2021

## 2021-12-21 ENCOUNTER — Other Ambulatory Visit: Payer: Self-pay | Admitting: Physician Assistant

## 2021-12-21 DIAGNOSIS — C779 Secondary and unspecified malignant neoplasm of lymph node, unspecified: Secondary | ICD-10-CM

## 2021-12-21 NOTE — Progress Notes (Signed)
Referral sent to [facility], DO at [facility] E[facility].  PH: [phone] FX: [phone]  Referral Progress note Imaging Facesheet  Confirmation was received.

## 2021-12-22 ENCOUNTER — Ambulatory Visit: Payer: Medicaid Other | Admitting: Hematology

## 2021-12-22 ENCOUNTER — Inpatient Hospital Stay: Payer: Medicaid Other | Attending: Physician Assistant | Admitting: Physician Assistant

## 2021-12-22 ENCOUNTER — Telehealth: Payer: Self-pay

## 2021-12-22 ENCOUNTER — Other Ambulatory Visit: Payer: Self-pay

## 2021-12-22 VITALS — BP 126/75 | HR 89 | Temp 97.7°F | Resp 17 | Ht 59.0 in | Wt 126.8 lb

## 2021-12-22 DIAGNOSIS — K449 Diaphragmatic hernia without obstruction or gangrene: Secondary | ICD-10-CM | POA: Diagnosis not present

## 2021-12-22 DIAGNOSIS — C155 Malignant neoplasm of lower third of esophagus: Secondary | ICD-10-CM | POA: Insufficient documentation

## 2021-12-22 DIAGNOSIS — R059 Cough, unspecified: Secondary | ICD-10-CM | POA: Diagnosis not present

## 2021-12-22 DIAGNOSIS — Z9049 Acquired absence of other specified parts of digestive tract: Secondary | ICD-10-CM | POA: Insufficient documentation

## 2021-12-22 DIAGNOSIS — I2510 Atherosclerotic heart disease of native coronary artery without angina pectoris: Secondary | ICD-10-CM | POA: Diagnosis not present

## 2021-12-22 DIAGNOSIS — K5730 Diverticulosis of large intestine without perforation or abscess without bleeding: Secondary | ICD-10-CM | POA: Insufficient documentation

## 2021-12-22 DIAGNOSIS — I700 Atherosclerosis of aorta: Secondary | ICD-10-CM | POA: Insufficient documentation

## 2021-12-22 DIAGNOSIS — I3139 Other pericardial effusion (noninflammatory): Secondary | ICD-10-CM | POA: Insufficient documentation

## 2021-12-22 DIAGNOSIS — Z79899 Other long term (current) drug therapy: Secondary | ICD-10-CM | POA: Diagnosis not present

## 2021-12-22 DIAGNOSIS — C760 Malignant neoplasm of head, face and neck: Secondary | ICD-10-CM | POA: Diagnosis not present

## 2021-12-22 DIAGNOSIS — K920 Hematemesis: Secondary | ICD-10-CM | POA: Insufficient documentation

## 2021-12-22 DIAGNOSIS — R590 Localized enlarged lymph nodes: Secondary | ICD-10-CM | POA: Insufficient documentation

## 2021-12-22 DIAGNOSIS — Z880 Allergy status to penicillin: Secondary | ICD-10-CM | POA: Diagnosis not present

## 2021-12-22 DIAGNOSIS — Z9221 Personal history of antineoplastic chemotherapy: Secondary | ICD-10-CM | POA: Diagnosis not present

## 2021-12-22 DIAGNOSIS — K2100 Gastro-esophageal reflux disease with esophagitis, without bleeding: Secondary | ICD-10-CM | POA: Diagnosis not present

## 2021-12-22 DIAGNOSIS — R634 Abnormal weight loss: Secondary | ICD-10-CM | POA: Insufficient documentation

## 2021-12-22 DIAGNOSIS — R918 Other nonspecific abnormal finding of lung field: Secondary | ICD-10-CM | POA: Diagnosis not present

## 2021-12-22 DIAGNOSIS — Z7189 Other specified counseling: Secondary | ICD-10-CM | POA: Diagnosis not present

## 2021-12-22 DIAGNOSIS — Z87891 Personal history of nicotine dependence: Secondary | ICD-10-CM | POA: Insufficient documentation

## 2021-12-22 MED ORDER — BENZONATATE 100 MG PO CAPS: 100.0000 mg | ORAL_CAPSULE | Freq: Three times a day (TID) | ORAL | 2 refills | Status: DC | PRN

## 2021-12-22 NOTE — Progress Notes (Signed)
Oncology Nurse Navigator Documentation   I met with patient after his follow up with Dr. Burr Medico today at the request of Cassie PA. He was accompanied by his daughter. I  introduced myself as his/their Navigator, explained my role as a member of the Care Team. Provided New Patient Information packet: Contact information for physician, this navigator, other members of the Care Team Advance Directive information ([place] blue pamphlet with LCSW insert); provided [facility] AD booklet at his request,  Fall Prevention Patient [place] Information sheet Symptom Management Clinic information [facility] campus map with highlight of [place] [facility] Information sheet Provided educational handouts for PEG and PAC. Assisted with post-consult appt scheduling.   I explained that the next steps were to have the PET scan completed and see an ENT MD for consult. He would then see Dr. Isidore Moos and Dr. Chryl Heck for consultation.  They verbalized understanding of information provided. I encouraged them to call with questions/concerns moving forward.  [name], RN, BSN, OCN Head & Neck Oncology Nurse [place] at [place] [phone]

## 2021-12-22 NOTE — Telephone Encounter (Signed)
Referral has been sent to [facility] ENT   [facility]: [phone], Option 1 FX: [phone]   Referral Progress note Labs Cytology report Surgical path report CT scans Facesheet  Confirmation was received.

## 2021-12-22 NOTE — Telephone Encounter (Signed)
Authorization # [id number] Valid 12/22/21 - 02/19/22

## 2021-12-22 NOTE — Patient Instructions (Addendum)
Summary:  --The sample (biopsy) that they took of your tumor was consistent with Squamous Cell Carcinoma. This probably originated somewhere in the head/neck.   -We covered a lot of important information at your appointment today regarding what the treatment plan is moving forward.  -We need to identify where this originated from.  The spot in your lymph node in your neck is likely a metastatic lymph node.  Fortunately the CT scan the chest abdomen and pelvis did not show any other sites of disease.  But we need a special scan called a PET scan to evaluate to ensure there are no other locations involved besides wherever the primary lesion is. -As of right now, it appears that this is locally advanced disease.  We will need to finish the work-up before deciding what the final treatment plan is but it may include chemoradiation.  More to discuss on this in the future after you have your scans and appointments with ENT.  Medications:  -I sent a prescription for Tessalon [place] for your cough to the pharmacy.  If you are unable to obtain this medication, you can use Robitussin or Delsym. -Please let our office know if you are pain gets worse and if you need stronger pain medication than Tylenol.  I would be happy to send in a prescription for Norco if needed.  Referrals or Imaging: -I have placed a referral to the ENT (Ears, Nose, and Throat) doctor to try to find where this cancer came from -I also ordered a PET scan. This will help [place] stage the cancer and find out where it came from.  -Eventually, we will probably have you see Dr. Isidore Moos, a radiation doctor.  -Dr. Chryl Heck is a medical oncologist that works at the clinic that specializes in head and neck cancers.  We will likely transition your care to her.   Follow up:  -We will see you back once you see ENT and have your PET scan for more detailed discussion about the recommended treatment options.

## 2021-12-29 ENCOUNTER — Ambulatory Visit (HOSPITAL_COMMUNITY)
Admission: RE | Admit: 2021-12-29 | Discharge: 2021-12-29 | Disposition: A | Discharge status: Home / Self Care | Payer: Medicaid Other | Source: Ambulatory Visit | Attending: Physician Assistant | Admitting: Physician Assistant

## 2021-12-29 ENCOUNTER — Telehealth: Payer: Self-pay | Admitting: Physician Assistant

## 2021-12-29 ENCOUNTER — Telehealth: Payer: Self-pay | Admitting: Medical Oncology

## 2021-12-29 DIAGNOSIS — C760 Malignant neoplasm of head, face and neck: Secondary | ICD-10-CM | POA: Diagnosis not present

## 2021-12-29 DIAGNOSIS — I2510 Atherosclerotic heart disease of native coronary artery without angina pectoris: Secondary | ICD-10-CM | POA: Diagnosis not present

## 2021-12-29 DIAGNOSIS — I700 Atherosclerosis of aorta: Secondary | ICD-10-CM | POA: Diagnosis not present

## 2021-12-29 MED ORDER — FLUDEOXYGLUCOSE F - 18 (FDG) INJECTION
7.0200 | Freq: Once | INTRAVENOUS | Status: AC | PRN
  Administered 2021-12-29: 7.02 via INTRAVENOUS

## 2021-12-29 NOTE — Telephone Encounter (Signed)
Attempted to call the patient to review his PET scan and to check to see if he has upcoming appointments with ENT. Unable to reach him. Call went straight to voicemail. Left our call back number. I will also try calling him back later.

## 2021-12-29 NOTE — Telephone Encounter (Signed)
Returning [place] call. He does not have ENT appts.

## 2021-12-30 ENCOUNTER — Telehealth: Payer: Self-pay | Admitting: Physician Assistant

## 2021-12-30 NOTE — Progress Notes (Signed)
Head and Neck Cancer Location of Tumor / Histology:  Squamous cell carcinoma of the neck, metastatic to cervical lyph nodes, p16(+)  PET Scan 12/29/2021 --IMPRESSION: Hypermetabolic mass or lymphadenopathy in the left upper neck, levels 2A and 2B. No other sites of malignancy identified.  CT Neck w/ Contrast 12/09/2021 --IMPRESSION: Large, heterogeneous left level 2A lymph node, consistent with metastatic disease. Hyperenhancing appearance of the salivary glands may be a sequela of radiation treatment. Please see the report of the CT of the chest, abdomen and pelvis performed the same day for evaluation of findings below the thoracic inlet.  Biopsies revealed:  12/19/2021 FINAL MICROSCOPIC DIAGNOSIS:  A. LEFT CERVICAL LYMPH NODE, BIOPSY:  Keratinizing moderately differentiated squamous cell carcinoma (p16 positive)  COMMENT:  An immunohistochemical stain for the HPV surrogate marker p16 is positive with adequate control  Nutrition Status Yes No Comments  Weight changes? '[]'$  '[x]'$    Swallowing concerns? '[]'$  '[x]'$  Denies, but does report soreness to the left side of his neck  PEG? '[]'$  '[x]'$     Referrals Yes No Comments  Social Work? '[x]'$  '[]'$    Dentistry? '[x]'$  '[]'$    Swallowing therapy? '[x]'$  '[]'$    Nutrition? '[x]'$  '[]'$    Med/Onc? '[x]'$  '[]'$     Safety Issues Yes No Comments  Prior radiation? '[x]'$  '[]'$  10/30/16-12/07/16 Esophagus/ 45 Gy in 25 fractions Esophagus boost/ 5.4 Gy in 3 fractions  Pacemaker/ICD? '[]'$  '[x]'$    Possible current pregnancy? '[]'$  '[x]'$  N/A  Is the patient on methotrexate? '[]'$  '[x]'$     Tobacco/Marijuana/Snuff/ETOH use: Patient is a former smoker (1 pack/day for ~38 years; quit in 2014). Denies any alcohol consumption or recreational drug use  Past/Anticipated interventions by otolaryngology, if any:  Scheduled for consultation with Dr. [name] on 01/10/2022  Past/Anticipated interventions by medical oncology, if any:  Scheduled for consultation with Dr. Benay Pike on  01/06/2022  Was under the care of Dr. [name] for esophageal adenocarcinoma  He was diagnosed in 09/2016. He is s/p neoadjuvant ChemoRT and esophagectomy.  He achieved complete pathological response to neoadjuvant therapy  His risk of recurrence is low. Dr. Burr Medico recommended cancer surveillance for 5 years. He has had multiple EGD for dilatation.  His last EGD with Dr [name] in 05/2021 was benign.  Last CT CAP in 02/2020 was NED.  Labs reviewed, CBC and CMP WNL.  Patient endorsing new left cervical adenopathy that has been present for about 2 months from today's appointment. No other adenopathy appreciated on exam. No recent infections, tick bites, cat scratches, sick contacts, or travel.  He had a CT of the neck, chest, abdomen/pelvis to assess for local recurrence. CT of the neck was suspicious for nodal metastasis.  His salivary glands were noted to me abnormal but felt to be sequela of radiation treatment.  The left cervical lymph node was biopsied and is consistent with squamous cell carcinoma. Therefore, this is not disease recurrence from his previous cancer.  Otherwise, no other signs of disease in CAP.   Presently, no evidence of disease recurrence from his prior esophageal adenocarcinoma.   Current Complaints / other details:  Mother reports cerebral palsy effecting patient's right side

## 2021-12-30 NOTE — Telephone Encounter (Signed)
I called the patient to review the results of his PET scan. He mentions that the metastatic left cervical lymph node is enlarging rather quickly.  PET scan from yesterday measures this at 4.2 cm at the largest dimensions compared to 1.9 cm a few weeks ago.  He does not want to take any pain medication until he can "absolutely cannot stand it", he was diagnosed with seizures a few months ago and is worried about taking any new medications.  I let him know that I received a message this morning that ENT is going to call him today to make an appointment.  I will also reach out to check on the status for an appointment with radiation oncology/Dr. Isidore Moos to try and expedite his appointments.  He expressed understanding with and was appreciative for the call.

## 2022-01-01 NOTE — Progress Notes (Signed)
Radiation Oncology         [phone] ________________________________  Initial Outpatient Consultation  Name: Darrell Cunningham MRN: [id number]  Date: 01/02/2022  DOB: [birth year]  NU:[id number], [name], MD  [name], MD   REFERRING PHYSICIAN: [name], MD  DIAGNOSIS: No diagnosis found.  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 male who presents today for consideration of radiation therapy in management of his recently diagnosed neck cancer . The patient has a history significant for stage III esophageal cancer diagnosed in 2018: s/p concurrent chemoradiation (Dr. Burr Medico and Dr. [name]) and esophagectomy. He achieved a complete pathological response to neoadjuvant therapy and continued on under surveillance with Dr. Burr Medico and Dr. [name] (gastro). The patient required multiple EGD's for dilation, his last of which was in November of 2022 and did not show any evidence of disease recurrence. His last CT of the chest abdomen and pelvis in August of 2021 showed NED.    In recent history, the patient presented to [facility] on 11/22/21 for evaluation of new left cervical lymphadenopathy x 1 month. The patient noted no change in size since onset, and denied any palpable pain or other associated symptoms.   Soft tissue neck CT w/ contrast on 12/09/21 for further evaluation revealed a large, heterogeneous left level 2A lymph node, presumably consistent with metastatic disease. No other enlarged or abnormal cervical  lymph nodes were appreciated. Other findings of potential clinical significance noted on CT included a hyperenhancing appearance of the salivary glands, which likely reflects a sequela of radiation treatment.  CT of chest abdomen and pelvis also performed on 12/09/21 demonstrated no evidence of metastatic disease or lymphadenopathy within the chest, abdomen, or pelvis.   Biopsies of the abnormal left cervical lymph node on 12/19/21 revealed keratinizing moderately differentiated squamous cell carcinoma (p16  Positive).   During his most recent follow up visit with Cassandra Heilingoetter PA-C on 12/22/21, the left cervical lymph node appeared significantly enlarged on examination, and the patient endorsed soreness to the area and discomfort when he moves his neck. The patient also felt as though the adenopathy was pressing on his esophagus and causing throat irritation and food impaction in his throat. Additionally, he reported more phlegm and mucus production/cough compared to his baseline over the last 3 weeks or so. Dr. Burr Medico met with the patient during this visit and explained the nature of his condition, specifically that his recent diagnosis is likely a second primary and not related to his history of esophageal cancer. Given that there is no clear primary for his new SCC, Dr. Burr Medico has accordingly placed a referral to ENT for further evaluation. Dr. Burr Medico also informed the patient that his treatment plan will depend on further staging work-up.  Pertinent imaging thus far includes a PET scan on 12/29/21 which demonstrated the the left level 2A cervical mass as hypermetabolic with an SUV max of 8.3, and measuring 4.2 x 2.2 cm. No other hypermetabolic masses or lymphadenopathy were seen within the neck, chest, abdomen,  or pelvis, or findings of osseous metastatic disease.  Swallowing issues, if any: some throat irritation and trouble swallowing food post biopsy, he did not endorse any swallowing issues  prior to his biopsy  Weight Changes: denies any recent weight loss and has been stable at around 128 lbs for several months (had weight loss with esophageal cancer)  Pain status: some neck discomfort with movement and soreness to his biopsy site  Other symptoms: post biopsy, the patient has endorsed soreness and discomfort when he moves his neck, throat irritation, some trouble swallowing his food, and an increase in phlegm and mucus production/cough compared to his baseline over the last 3 weeks  Tobacco history, if any: he quit smoking about 9 years ago, prior to that he had a 38 pack year history   ETOH abuse, if any: none   Prior cancers, if any: esophageal cancer   PREVIOUS RADIATION THERAPY: Yes - Dr. [name]   Diagnosis:  Probable distal esophageal cancer    Indication for treatment:  Curative      Radiation treatment dates:  10/30/16-12/07/16 Site/dose: 1) Esophagus/ 45 Gy in 25 fractions                    2) Esophagus boost/ 5.4 Gy in 3 fractions Beams/energy:  1) 3D/ 6xFFF                             2) 3D/ 6xFFF  OTHER PREVIOUS THERAPY:  -- Weekly Carboplatin AUC 2 and taxol 32m/m2 with concurrent radiation 10/31/2016 - 12/07/2016  PAST MEDICAL HISTORY:  has a past medical history of Allergy, Arthritis, Asthma, [place]), COPD (chronic obstructive pulmonary disease) ([place], CP (cerebral palsy), spastic ([place], Dysphagia, Dyspnea, [place]se[place] of lung ([place], Encounter for nonprocreative genetic counseling (10/31/2016), GERD (gastroesophageal reflux disease), History of chemotherapy, History of radiation therapy, Hypertension, Neuromuscular disorder ([place], and Pneumonia (4 yrs ago).    PAST SURGICAL HISTORY: Past Surgical History:  Procedure Laterality Date   COLONOSCOPY     COMPLETE ESOPHAGECTOMY N/A 04/16/2017   Procedure: ESOPHAGECTOMY COMPLETE,Transhiatal total esophagectomy;  Surgeon: GGrace Isaac MD;  [facility];  Service: Thoracic;  Laterality:  N/A;   ESOPHAGOGASTRODUODENOSCOPY (EGD) WITH PROPOFOL N/A 03/30/2019   Procedure: ESOPHAGOGASTRODUODENOSCOPY (EGD) WITH PROPOFOL;  Surgeon: SLadene Artist MD;  [facility];  Service: Endoscopy;  Laterality: N/A;   EUS N/A 10/26/2016   Procedure: UPPER ENDOSCOPIC ULTRASOUND (EUS) LINEAR;  Surgeon: DMilus Banister MD;  Location[facility];  Service: Endoscopy;  Laterality: N/A;   EYE SURGERY Bilateral age 61   for cross eyes   FOREIGN BODY REMOVAL  03/30/2019   Procedure: FOREIGN BODY REMOVAL;  Surgeon: SLadene Artist MD;  [place]  Service: Endoscopy;;   IR FLUORO GUIDE PORT INSERTION RIGHT  11/08/2016   right upper chest   IR REMOVAL TUN ACCESS W/ PORT W/O FL MOD SED  04/12/2020   IR [place] VASC ACCESS RIGHT  11/08/2016   JEJUNOSTOMY N/A 04/16/2017   Procedure: JDonney Rankins  Surgeon: GGrace Isaac MD;  [place]  Service: Thoracic;  Laterality: N/A;   JEJUNOSTOMY     removal of feeding tube /pt unsure of date   MOUTH SURGERY     POLYPECTOMY     UPPER GASTROINTESTINAL ENDOSCOPY     UPPER GASTROINTESTINAL ENDOSCOPY  06/03/2021   hx of esoph ca   VIDEO BRONCHOSCOPY N/A 04/16/2017  Procedure: VIDEO BRONCHOSCOPY, Transhiatal Total Esophagectomy, Esophagogastrostomy, pyloromotomy, Feeding Jejunostomy;  Surgeon: [name], MD;  Location: [facility] OR;  Service: Thoracic;  Laterality: N/A;    FAMILY HISTORY: family history includes Breast cancer (age of onset: 86) in his sister; Breast cancer (age of onset: 80) in his maternal grandmother; COPD in his father; Colon cancer (age of onset: 87) in his maternal grandmother; Ovarian cancer in his maternal aunt.  SOCIAL HISTORY:  reports that he quit smoking about 9 years ago. His smoking use included cigarettes. He started smoking about 9 years ago. He has a 38.00 pack-year smoking history. He has never used smokeless tobacco. He reports that he does not drink alcohol and does not use  drugs.  ALLERGIES: Penicillins  MEDICATIONS:  Current Outpatient Medications  Medication Sig Dispense Refill   acetaminophen (TYLENOL) 500 MG tablet Take 500 mg by mouth every 6 (six) hours as needed.     albuterol (VENTOLIN HFA) 108 (90 Base) MCG/ACT inhaler Inhale 2 puffs into the lungs every 6 (six) hours as needed for wheezing or shortness of breath. 8 g 3   atorvastatin (LIPITOR) 20 MG tablet TAKE 1 TABLET BY MOUTH EVERY DAY 90 tablet 3   benzonatate (TESSALON) 100 MG capsule Take 1 capsule (100 mg total) by mouth 3 (three) times daily as needed for cough. 30 capsule 2   Budeson-Glycopyrrol-Formoterol (BREZTRI AEROSPHERE) 160-9-4.8 MCG/ACT AERO Inhale 2 puffs into the lungs 2 (two) times daily. 10.7 g 11   cyclobenzaprine (FLEXERIL) 5 MG tablet Take 1 tablet (5 mg total) by mouth 3 (three) times daily as needed for muscle spasms. 30 tablet 0   fluticasone (FLONASE) 50 MCG/ACT nasal spray SPRAY 1 SPRAY INTO EACH NOSTRIL EVERY DAY AS NEEDED FOR ALLERGIES/RHINITIS 16 mL 2   levETIRAcetam (KEPPRA) 500 MG tablet TAKE 1 TABLET BY MOUTH TWICE A DAY 60 tablet 1   pantoprazole (PROTONIX) 40 MG tablet TAKE 1 TABLET BY MOUTH TWICE A DAY 60 tablet 0   rOPINIRole (REQUIP) 1 MG tablet Take 1 tablet (1 mg total) by mouth at bedtime. 30 tablet 5   No current facility-administered medications for this encounter.    REVIEW OF SYSTEMS:  Notable for that above.   PHYSICAL EXAM:  vitals were not taken for this visit.   General: Alert and oriented, in no acute distress HEENT: Head is normocephalic. Extraocular movements are intact. Oropharynx is notable for ***. Neck: Neck is notable for *** Heart: Regular in rate and rhythm with no murmurs, rubs, or gallops. Chest: Clear to auscultation bilaterally, with no rhonchi, wheezes, or rales. Abdomen: Soft, nontender, nondistended, with no rigidity or guarding. Extremities: No cyanosis or edema. Lymphatics: see Neck Exam Skin: No concerning  lesions. Musculoskeletal: symmetric strength and muscle tone throughout. Neurologic: Cranial nerves II through XII are grossly intact. No obvious focalities. Speech is fluent. Coordination is intact. Psychiatric: Judgment and insight are intact. Affect is appropriate.   ECOG = ***  0 - Asymptomatic (Fully active, able to carry on all predisease activities without restriction)  1 - Symptomatic but completely ambulatory (Restricted in physically strenuous activity but ambulatory and able to carry out work of a light or sedentary nature. For example, light housework, office work)  2 - Symptomatic, <50% in bed during the day (Ambulatory and capable of all self care but unable to carry out any work activities. Up and about more than 50% of waking hours)  3 - Symptomatic, >50% in bed, but not bedbound (Capable of only  limited self-care, confined to bed or chair 50% or more of waking hours)  4 - Bedbound (Completely disabled. Cannot carry on any self-care. Totally confined to bed or chair)  5 - Death   Eustace Pen MM, Creech RH, Tormey DC, et al. [phone]). "Toxicity and response criteria of the [facility]". [place] Oncol. 5 (6): 649-55   LABORATORY DATA:  Lab Results  Component Value Date   WBC 7.2 11/22/2021   HGB 13.5 11/22/2021   HCT 40.5 11/22/2021   MCV 91.8 11/22/2021   PLT 221 11/22/2021   CMP     Component Value Date/Time   NA 138 11/22/2021 1456   NA 137 06/14/2017 0840   K 3.7 11/22/2021 1456   K 3.9 06/14/2017 0840   CL 104 11/22/2021 1456   CO2 28 11/22/2021 1456   CO2 22 06/14/2017 0840   GLUCOSE 104 (H) 11/22/2021 1456   GLUCOSE 101 06/14/2017 0840   BUN 16 11/22/2021 1456   BUN 10.2 06/14/2017 0840   CREATININE 0.77 11/22/2021 1456   CREATININE 0.7 06/14/2017 0840   CALCIUM 9.1 11/22/2021 1456   CALCIUM 9.5 06/14/2017 0840   PROT 7.4 11/22/2021 1456   PROT 7.4 06/14/2017 0840   ALBUMIN 4.1 11/22/2021 1456   ALBUMIN 3.2 (L) 06/14/2017  0840   AST 17 11/22/2021 1456   AST 22 06/14/2017 0840   ALT 13 11/22/2021 1456   ALT 26 06/14/2017 0840   ALKPHOS 83 11/22/2021 1456   ALKPHOS 132 06/14/2017 0840   BILITOT 0.5 11/22/2021 1456   BILITOT 0.38 06/14/2017 0840   GFRNONAA >60 11/22/2021 1456   GFRNONAA >89 09/21/2016 1137   GFRAA >60 03/09/2020 0818   GFRAA >60 12/11/2018 0941   GFRAA >89 09/21/2016 1137      No results found for: TSH   RADIOGRAPHY: CT Soft Tissue Neck W Contrast  Result Date: 12/11/2021 CLINICAL DATA:  Stage 3 esophageal carcinoma. Malignant neoplasm of lower third of esophagus. Cervical adenopathy. EXAM: CT NECK WITH CONTRAST TECHNIQUE: Multidetector CT imaging of the neck was performed using the standard protocol following the bolus administration of intravenous contrast. RADIATION DOSE REDUCTION: This exam was performed according to the departmental dose-optimization program which includes automated exposure control, adjustment of the mA and/or kV according to patient size and/or use of iterative reconstruction technique. CONTRAST:  14m OMNIPAQUE IOHEXOL 300 MG/ML  SOLN COMPARISON:  None Available. FINDINGS: Pharynx and larynx: No pharyngeal or laryngeal abnormality. The cervical esophagus is unremarkable. Salivary glands: Hyperenhancing appearance of the salivary glands may be a sequela of radiation treatment. Thyroid: Normal Lymph nodes: There is a large, heterogeneous left level 2A lymph node measuring 1.9 cm. There are no other enlarged or abnormal density cervical lymph nodes. Vascular: Mild atherosclerotic calcification of the right carotid bifurcation. Limited intracranial: Normal Visualized orbits: Normal Mastoids and visualized paranasal sinuses: Clear Skeleton: No osseous lesions. IMPRESSION: 1. Large, heterogeneous left level 2A lymph node, consistent with metastatic disease. 2. Hyperenhancing appearance of the salivary glands may be a sequela of radiation treatment. 3. Please see the report of the  CT of the chest, abdomen and pelvis performed the same day for evaluation of findings below the thoracic inlet. Electronically Signed   By: KUlyses JarredM.D.   On: 12/11/2021 20:05   CT Chest W Contrast  Result Date: 12/12/2021 CLINICAL DATA:  Esophageal cancer, restaging, newly enlarged cervical lymph nodes, status post chemotherapy and XRT * Tracking Code: BO * EXAM: CT CHEST, ABDOMEN, AND  PELVIS WITH CONTRAST TECHNIQUE: Multidetector CT imaging of the chest, abdomen and pelvis was performed following the standard protocol during bolus administration of intravenous contrast. RADIATION DOSE REDUCTION: This exam was performed according to the departmental dose-optimization program which includes automated exposure control, adjustment of the mA and/or kV according to patient size and/or use of iterative reconstruction technique. CONTRAST:  13m OMNIPAQUE IOHEXOL 300 MG/ML SOLN, additional oral enteric contrast COMPARISON:  03/09/2020 FINDINGS: CT CHEST FINDINGS Cardiovascular: Aortic atherosclerosis. Normal heart size. Three-vessel coronary artery calcifications. Trace pericardial effusion. Mediastinum/Nodes: No enlarged mediastinal, hilar, or axillary lymph nodes. Status post pull-through esophagectomy. Hiatal hernia containing transverse colon and pancreas. Thyroid gland and trachea demonstrate no significant findings. Lungs/Pleura: Mild, diffuse bilateral bronchial wall thickening. Background of fine centrilobular pulmonary nodularity, most concentrated in the lung apices. No pleural effusion or pneumothorax. Musculoskeletal: No chest wall mass or suspicious osseous lesions identified. CT ABDOMEN PELVIS FINDINGS Hepatobiliary: No solid liver abnormality is seen. No gallstones, gallbladder wall thickening, or biliary dilatation. Pancreas: Unremarkable. No pancreatic ductal dilatation or surrounding inflammatory changes. Spleen: Normal in size without significant abnormality. Adrenals/Urinary Tract: Adrenal  glands are unremarkable. Kidneys are normal, without renal calculi, solid lesion, or hydronephrosis. Bladder is unremarkable. Stomach/Bowel: Stomach is within normal limits. Appendix appears normal. No evidence of bowel wall thickening, distention, or inflammatory changes. Descending and sigmoid diverticulosis. Vascular/Lymphatic: Aortic atherosclerosis. No enlarged abdominal or pelvic lymph nodes. Reproductive: No mass or other abnormality. Other: No abdominal wall hernia or abnormality. No ascites. Musculoskeletal: No acute osseous findings. IMPRESSION: 1. Status post pull-through esophagectomy. 2. No evidence of metastatic disease within the chest, abdomen, or pelvis. Specifically, no additional lymphadenopathy identified. 3. Mild, diffuse bilateral bronchial wall thickening and background of fine centrilobular pulmonary nodularity, most commonly seen in smoking-related respiratory bronchiolitis. 4. Coronary artery disease. Aortic Atherosclerosis (ICD10-I70.0). Electronically Signed   By: ADelanna AhmadiM.D.   On: 12/12/2021 14:34   CT Abdomen Pelvis W Contrast  Result Date: 12/12/2021 CLINICAL DATA:  Esophageal cancer, restaging, newly enlarged cervical lymph nodes, status post chemotherapy and XRT * Tracking Code: BO * EXAM: CT CHEST, ABDOMEN, AND PELVIS WITH CONTRAST TECHNIQUE: Multidetector CT imaging of the chest, abdomen and pelvis was performed following the standard protocol during bolus administration of intravenous contrast. RADIATION DOSE REDUCTION: This exam was performed according to the departmental dose-optimization program which includes automated exposure control, adjustment of the mA and/or kV according to patient size and/or use of iterative reconstruction technique. CONTRAST:  1040mOMNIPAQUE IOHEXOL 300 MG/ML SOLN, additional oral enteric contrast COMPARISON:  03/09/2020 FINDINGS: CT CHEST FINDINGS Cardiovascular: Aortic atherosclerosis. Normal heart size. Three-vessel coronary artery  calcifications. Trace pericardial effusion. Mediastinum/Nodes: No enlarged mediastinal, hilar, or axillary lymph nodes. Status post pull-through esophagectomy. Hiatal hernia containing transverse colon and pancreas. Thyroid gland and trachea demonstrate no significant findings. Lungs/Pleura: Mild, diffuse bilateral bronchial wall thickening. Background of fine centrilobular pulmonary nodularity, most concentrated in the lung apices. No pleural effusion or pneumothorax. Musculoskeletal: No chest wall mass or suspicious osseous lesions identified. CT ABDOMEN PELVIS FINDINGS Hepatobiliary: No solid liver abnormality is seen. No gallstones, gallbladder wall thickening, or biliary dilatation. Pancreas: Unremarkable. No pancreatic ductal dilatation or surrounding inflammatory changes. Spleen: Normal in size without significant abnormality. Adrenals/Urinary Tract: Adrenal glands are unremarkable. Kidneys are normal, without renal calculi, solid lesion, or hydronephrosis. Bladder is unremarkable. Stomach/Bowel: Stomach is within normal limits. Appendix appears normal. No evidence of bowel wall thickening, distention, or inflammatory changes. Descending and sigmoid diverticulosis. Vascular/Lymphatic: Aortic atherosclerosis. No  enlarged abdominal or pelvic lymph nodes. Reproductive: No mass or other abnormality. Other: No abdominal wall hernia or abnormality. No ascites. Musculoskeletal: No acute osseous findings. IMPRESSION: 1. Status post pull-through esophagectomy. 2. No evidence of metastatic disease within the chest, abdomen, or pelvis. Specifically, no additional lymphadenopathy identified. 3. Mild, diffuse bilateral bronchial wall thickening and background of fine centrilobular pulmonary nodularity, most commonly seen in smoking-related respiratory bronchiolitis. 4. Coronary artery disease. Aortic Atherosclerosis (ICD10-I70.0). Electronically Signed   By: Delanna Ahmadi M.D.   On: 12/12/2021 14:34   NM PET Image  Initial (PI) Skull Base To Thigh  Result Date: 12/29/2021 CLINICAL DATA:  Initial treatment strategy for head and neck squamous cell carcinoma. Personal history of esophageal carcinoma. EXAM: NUCLEAR MEDICINE PET SKULL BASE TO THIGH TECHNIQUE: 7.0 mCi F-18 FDG was injected intravenously. Full-ring PET imaging was performed from the skull base to thigh after the radiotracer. CT data was obtained and used for attenuation correction and anatomic localization. Fasting blood glucose: 90 mg/dl COMPARISON:  CT on 12/09/2021 FINDINGS: Mediastinal blood-pool activity (background): SUV max = 2.2 Liver activity (reference): SUV max = N/A NECK: Hypermetabolic mass or lymphadenopathy is seen in the left upper neck in levels 2A and 2B, which measures 4.2 x 2.2 cm on image 74/3. This is hypermetabolic, with SUV max of 8.3. No other hypermetabolic masses or lymphadenopathy seen within the neck. Incidental CT findings:  None. CHEST: No hypermetabolic lymph nodes. No suspicious pulmonary nodules seen on CT images. Incidental CT findings: Prior esophagectomy with gastric pull-through noted. Aortic and coronary atherosclerotic calcification incidentally noted. ABDOMEN/PELVIS: No abnormal hypermetabolic activity within the liver, pancreas, adrenal glands, or spleen. No hypermetabolic lymph nodes in the abdomen or pelvis. Incidental CT findings: Aortic atherosclerotic calcification incidentally noted. SKELETON: No focal hypermetabolic bone lesions to suggest skeletal metastasis. Incidental CT findings:  None. IMPRESSION: Hypermetabolic mass or lymphadenopathy in the left upper neck, levels 2A and 2B. No other sites of malignancy identified. Aortic Atherosclerosis (ICD10-I70.0). Electronically Signed   By: Marlaine Hind M.D.   On: 12/29/2021 11:12   [place] CORE BIOPSY (LYMPH NODES)  Result Date: 12/19/2021 INDICATION: Enlarged cervical lymph node, history of esophageal cancer EXAM: Ultrasound-guided core needle biopsy of left cervical  lymph node MEDICATIONS: None. ANESTHESIA/SEDATION: Local analgesia COMPLICATIONS: None immediate. PROCEDURE: Informed written consent was obtained from the patient after a thorough discussion of the procedural risks, benefits and alternatives. All questions were addressed. Maximal Sterile Barrier Technique was utilized including caps, mask, sterile gowns, sterile gloves, sterile drape, hand hygiene and skin antiseptic. A timeout was performed prior to the initiation of the procedure. The patient was placed supine on the exam table. Limited ultrasound of the left neck was performed for planning purposes. This again demonstrated an enlarged left cervical lymph node, consistent with recent CT of the neck. Skin entry site was marked, and the overlying skin was prepped and draped in the standard sterile fashion. Local analgesia was obtained with 1% lidocaine. Under ultrasound guidance, core needle biopsy was performed of the target left cervical lymph node using an 18 gauge core biopsy device x5 passes. Specimens were submitted in saline to pathology for further handling. Limited postprocedure imaging demonstrated no enlarging hematoma. Clean dressing was placed after hemostasis. The patient tolerated the procedure well without immediate complication. IMPRESSION: Successful ultrasound-guided core needle biopsy of enlarged left cervical lymph node. Electronically Signed   By: Albin Felling M.D.   On: 12/19/2021 14:48      IMPRESSION/PLAN:  This is a  delightful patient with head and neck cancer. I *** recommend radiotherapy for this patient.  We discussed the potential risks, benefits, and side effects of radiotherapy. We talked in detail about acute and late effects. We discussed that some of the most bothersome acute effects may be mucositis, dysgeusia, salivary changes, skin irritation, hair loss, dehydration, weight loss and fatigue. We talked about late effects which include but are not necessarily limited to  dysphagia, hypothyroidism, nerve injury, vascular injury, spinal cord injury, xerostomia, trismus, neck edema, and potential injury to any of the tissues in the head and neck region. No guarantees of treatment were given. A consent form was signed and placed in the patient's medical record. The patient is enthusiastic about proceeding with treatment. I look forward to participating in the patient's care.    Simulation (treatment planning) will take place ***  We also discussed that the treatment of head and neck cancer is a multidisciplinary process to maximize treatment outcomes and quality of life. For this reason the following referrals have been or will be made:  *** Medical oncology to discuss chemotherapy   *** Dentistry for dental evaluation, possible extractions in the radiation fields, and /or advice on reducing risk of cavities, osteoradionecrosis, or other oral issues.  *** Nutritionist for nutrition support during and after treatment.  *** Speech language pathology for swallowing and/or speech therapy.  *** Social work for social support.   *** Physical therapy due to risk of lymphedema in neck and deconditioning.  *** Baseline labs including TSH.  On date of service, in total, I spent *** minutes on this encounter. Patient was seen in person.  __________________________________________   Eppie Gibson, MD  This document serves as a record of services personally performed by Eppie Gibson, MD. It was created on her behalf by Roney Mans, a trained medical scribe. The creation of this record is based on the scribe's personal observations and the provider's statements to them. This document has been checked and approved by the attending provider.

## 2022-01-02 ENCOUNTER — Other Ambulatory Visit: Payer: Self-pay

## 2022-01-02 ENCOUNTER — Ambulatory Visit
Admission: RE | Admit: 2022-01-02 | Discharge: 2022-01-02 | Disposition: A | Discharge status: Home / Self Care | Payer: Medicaid Other | Source: Ambulatory Visit | Attending: Radiation Oncology | Admitting: Radiation Oncology

## 2022-01-02 ENCOUNTER — Encounter: Payer: Self-pay | Admitting: Radiation Oncology

## 2022-01-02 VITALS — BP 134/82 | HR 76 | Temp 97.3°F | Resp 18 | Ht 59.0 in | Wt 126.4 lb

## 2022-01-02 DIAGNOSIS — G809 Cerebral palsy, unspecified: Secondary | ICD-10-CM | POA: Insufficient documentation

## 2022-01-02 DIAGNOSIS — C760 Malignant neoplasm of head, face and neck: Secondary | ICD-10-CM | POA: Diagnosis not present

## 2022-01-02 DIAGNOSIS — Z9221 Personal history of antineoplastic chemotherapy: Secondary | ICD-10-CM | POA: Insufficient documentation

## 2022-01-02 DIAGNOSIS — I10 Essential (primary) hypertension: Secondary | ICD-10-CM | POA: Insufficient documentation

## 2022-01-02 DIAGNOSIS — K5730 Diverticulosis of large intestine without perforation or abscess without bleeding: Secondary | ICD-10-CM | POA: Diagnosis not present

## 2022-01-02 DIAGNOSIS — C155 Malignant neoplasm of lower third of esophagus: Secondary | ICD-10-CM | POA: Insufficient documentation

## 2022-01-02 DIAGNOSIS — Z87891 Personal history of nicotine dependence: Secondary | ICD-10-CM | POA: Diagnosis not present

## 2022-01-02 DIAGNOSIS — K449 Diaphragmatic hernia without obstruction or gangrene: Secondary | ICD-10-CM | POA: Insufficient documentation

## 2022-01-02 DIAGNOSIS — J439 Emphysema, unspecified: Secondary | ICD-10-CM | POA: Insufficient documentation

## 2022-01-02 DIAGNOSIS — I700 Atherosclerosis of aorta: Secondary | ICD-10-CM | POA: Diagnosis not present

## 2022-01-02 DIAGNOSIS — K219 Gastro-esophageal reflux disease without esophagitis: Secondary | ICD-10-CM | POA: Insufficient documentation

## 2022-01-02 DIAGNOSIS — Z79899 Other long term (current) drug therapy: Secondary | ICD-10-CM | POA: Diagnosis not present

## 2022-01-02 DIAGNOSIS — C770 Secondary and unspecified malignant neoplasm of lymph nodes of head, face and neck: Secondary | ICD-10-CM | POA: Insufficient documentation

## 2022-01-02 DIAGNOSIS — R590 Localized enlarged lymph nodes: Secondary | ICD-10-CM | POA: Insufficient documentation

## 2022-01-02 DIAGNOSIS — I2510 Atherosclerotic heart disease of native coronary artery without angina pectoris: Secondary | ICD-10-CM | POA: Insufficient documentation

## 2022-01-02 DIAGNOSIS — Z923 Personal history of irradiation: Secondary | ICD-10-CM | POA: Diagnosis not present

## 2022-01-02 NOTE — Progress Notes (Signed)
Oncology Nurse Navigator Documentation   Met with patient during initial consult with Dr. Squire. He was accompanied by his daughter and his mother.  Further introduced myself as his/their Navigator, explained my role as a member of the Care Team. Assisted with post-consult appt scheduling. They verbalized understanding of information provided. I encouraged them to call with questions/concerns moving forward.  Jennifer Malmfelt, RN, BSN, OCN Head & Neck Oncology Nurse Navigator [facility] at [place] [phone]  

## 2022-01-03 ENCOUNTER — Other Ambulatory Visit: Payer: Self-pay

## 2022-01-03 ENCOUNTER — Encounter: Payer: Self-pay | Admitting: Hematology

## 2022-01-03 DIAGNOSIS — C760 Malignant neoplasm of head, face and neck: Secondary | ICD-10-CM

## 2022-01-03 LAB — SURGICAL PATHOLOGY

## 2022-01-03 NOTE — Progress Notes (Signed)
Oncology Nurse Navigator Documentation   I sent a request to pathology on 12/30/21 to add EBV testing to his 12/19/21 pathology at the request of Dr. Eppie Gibson. I will follow for results.  [name] RN, BSN, OCN Head & Neck Oncology Nurse [place] [facility] Phone # [phone]  Fax # [phone]

## 2022-01-06 ENCOUNTER — Encounter: Payer: Self-pay | Admitting: Hematology and Oncology

## 2022-01-06 ENCOUNTER — Inpatient Hospital Stay: Payer: Medicaid Other | Admitting: Hematology and Oncology

## 2022-01-06 ENCOUNTER — Other Ambulatory Visit: Payer: Self-pay

## 2022-01-06 ENCOUNTER — Ambulatory Visit
Admission: RE | Admit: 2022-01-06 | Discharge: 2022-01-06 | Disposition: A | Discharge status: Home / Self Care | Payer: Medicaid Other | Source: Ambulatory Visit | Attending: Hematology and Oncology | Admitting: Hematology and Oncology

## 2022-01-06 VITALS — BP 110/69 | HR 84 | Temp 97.9°F | Resp 16 | Ht 59.0 in | Wt 124.9 lb

## 2022-01-06 DIAGNOSIS — K5730 Diverticulosis of large intestine without perforation or abscess without bleeding: Secondary | ICD-10-CM | POA: Insufficient documentation

## 2022-01-06 DIAGNOSIS — E46 Unspecified protein-calorie malnutrition: Secondary | ICD-10-CM | POA: Insufficient documentation

## 2022-01-06 DIAGNOSIS — C155 Malignant neoplasm of lower third of esophagus: Secondary | ICD-10-CM | POA: Insufficient documentation

## 2022-01-06 DIAGNOSIS — Z9221 Personal history of antineoplastic chemotherapy: Secondary | ICD-10-CM | POA: Insufficient documentation

## 2022-01-06 DIAGNOSIS — C760 Malignant neoplasm of head, face and neck: Secondary | ICD-10-CM

## 2022-01-06 DIAGNOSIS — Z79899 Other long term (current) drug therapy: Secondary | ICD-10-CM | POA: Insufficient documentation

## 2022-01-06 DIAGNOSIS — R590 Localized enlarged lymph nodes: Secondary | ICD-10-CM | POA: Diagnosis not present

## 2022-01-06 DIAGNOSIS — I700 Atherosclerosis of aorta: Secondary | ICD-10-CM | POA: Diagnosis not present

## 2022-01-06 DIAGNOSIS — Z87891 Personal history of nicotine dependence: Secondary | ICD-10-CM | POA: Insufficient documentation

## 2022-01-06 DIAGNOSIS — C770 Secondary and unspecified malignant neoplasm of lymph nodes of head, face and neck: Secondary | ICD-10-CM | POA: Insufficient documentation

## 2022-01-06 DIAGNOSIS — Z9049 Acquired absence of other specified parts of digestive tract: Secondary | ICD-10-CM | POA: Insufficient documentation

## 2022-01-06 DIAGNOSIS — Z880 Allergy status to penicillin: Secondary | ICD-10-CM | POA: Insufficient documentation

## 2022-01-06 DIAGNOSIS — K222 Esophageal obstruction: Secondary | ICD-10-CM | POA: Insufficient documentation

## 2022-01-06 DIAGNOSIS — I3139 Other pericardial effusion (noninflammatory): Secondary | ICD-10-CM | POA: Insufficient documentation

## 2022-01-06 DIAGNOSIS — C779 Secondary and unspecified malignant neoplasm of lymph node, unspecified: Secondary | ICD-10-CM | POA: Diagnosis not present

## 2022-01-06 DIAGNOSIS — K449 Diaphragmatic hernia without obstruction or gangrene: Secondary | ICD-10-CM | POA: Insufficient documentation

## 2022-01-06 DIAGNOSIS — I2510 Atherosclerotic heart disease of native coronary artery without angina pectoris: Secondary | ICD-10-CM | POA: Diagnosis not present

## 2022-01-06 LAB — TSH: TSH: 1.578 u[IU]/mL (ref 0.350–4.500)

## 2022-01-06 LAB — BUN & CREATININE (CHCC)
BUN: 18 mg/dL (ref 8–23)
Creatinine: 0.83 mg/dL (ref 0.61–1.24)
GFR, Estimated: >60 mL/min (ref >60)

## 2022-01-06 NOTE — Progress Notes (Signed)
Oncology Nurse Navigator Documentation   I met with Mr. Clendenen, his daughter, and his mother after his appointment with Dr. Iruku today. He will see Dr. Rosen on 6/13 to discuss further biopsies. He is currently scheduled for CT simulation on 6/19 and he knows that I will call him on 6/19 to confirm that appointment. He knows to call me with any questions regarding appointments or his care at anytime.   Jennifer Malmfelt RN, BSN, OCN Head & Neck Oncology Nurse Navigator [facility] at [facility] Phone # [phone]  Fax # [phone]  

## 2022-01-06 NOTE — Progress Notes (Signed)
[facility] OFFICE PROGRESS NOTE  Darrell Frizzle, MD [address] [place] [address]  DIAGNOSIS: Follow up left neck node biopsy result   Oncology History Overview Note  Cancer Staging Esophageal cancer [facility]) Staging form: Esophagus - Adenocarcinoma, AJCC 8th Edition - Clinical stage from 10/26/2016: Stage III (cT3, cN1, cM0) - Signed by [name], MD on 11/05/2016     Malignant neoplasm of lower third of esophagus ([place])  09/21/2016 - 09/21/2016 Hospital Admission   esophageal pain and vomiting up blood   10/09/2016 Initial Diagnosis   Esophageal cancer ([place])   10/09/2016 Procedure   EGD 1. Partially obstructing, likely malignant esophageal tumor was found in the lower third of the esophagus. Multiple biopsies.  2. Mass visible during gastric retroflexion at GE junction 3. Otherwise normal stomach 4. Normal examined duodenum    10/09/2016 Pathology Results   Esophagus, biopsy, distal esophageal tumor ([id number]) - SUSPICIOUS FOR ADENOCARCINOMA   10/12/2016 Imaging   CT CAP w Contrast IMPRESSION: Distal esophageal mass compatible with primary esophageal malignancy. There are 2 adjacent abnormal appearing subcentimeter paraesophageal lymph nodes which may represent nodal metastasis. Additionally there is a prominent nonspecific 8 mm upper abdominal lymph node.   No evidence for distant metastatic disease in the chest, abdomen or pelvis.   10/24/2016 PET scan   1. Markedly hypermetabolic distal esophageal lesion, compatible with malignancy. Adjacent small paraesophageal lymph nodes are abnormal by CT but cannot be resolved as separate structures from the hypermetabolic esophageal activity on the PET images. No hypermetabolism is demonstrated in the upper abdominal/gastrohepatic ligament lymph node although the small size of this lymph node may be below threshold for detection on PET imaging. 2. No evidence for distant hypermetabolic metastatic disease  in the neck, chest, abdomen, or pelvis.   10/26/2016 Pathology Results   Esophagogastric junction, biopsy, mass - INVASIVE ADENOCARCINOMA.   10/26/2016 Procedure   EUS showed uT3N1 disease, and biopsy confirmed adenocarcinoma    10/30/2016 - 12/07/2016 Radiation Therapy   Neoadjuvant radiation to esophageal cancer  Under the care of Dr. [name]   10/31/2016 - 12/07/2016 Neo-Adjuvant Chemotherapy   Weekly Carboplatin AUC 2 and taxol '50mg'$ /m2 with concurrent radiation     12/14/2016 Imaging   CT AP W Contrast 12/14/16 IMPRESSION: 1. Mildly dilated short segment of proximal jejunum without bowel wall thickening or significant inflammatory changes. Findings may represent focal ileus or radiation enteritis. No evidence for obstruction. 2. Significantly improved appearance of the distal esophagus/proximal stomach with decreased wall thickening (1.1 cm from 2.3 cm), and smaller paraesophageal and gastrohepatic ligament lymph nodes. 3. No bowel obstruction.  Normal appendix.   01/18/2017 Imaging   CT CAP W Contrast 01/18/17 IMPRESSION: 1. Mild stable distal esophageal wall thickening likely due to radiation change. No findings for recurrent tumor. Small paraesophageal lymph nodes are also stable. 2. No findings for metastatic disease. 3. Stable mild/early emphysematous changes and age advanced atherosclerotic calcifications involving the thoracic and abdominal aorta and branch vessels.   04/12/2017 Imaging   CT Chest and Abdomen W Contrast 04/12/17  IMPRESSION: 1. Mild stable distal esophageal wall thickening. No findings for recurrent tumor. 2. Stable small mediastinal and left supraclavicular lymph nodes. 3. There is a tiny nodule within the medial right lower lobe measuring 3 mm. New from previous exam. Nonspecific. Attention on follow-up imaging advise. 4.  Aortic Atherosclerosis (ICD10-I70.0). 5. Three vessel coronary artery calcification   04/16/2017 Surgery   VIDEO BRONCHOSCOPY,  Transhiatal Total Esophagectomy, Esophagogastrostomy,  pyloromotomy, Feeding Jejunostomy ESOPHAGECTOMY COMPLETE,Transhiatal total esophagectomy JEJUNOSTOMY,Feeding by Dr. [name] 04/16/17   04/16/2017 Pathology Results   Diagnosis 04/16/17  1. Omentum, resection for tumor - BENIGN ADIPOSE TISSUE CONSISTENT WITH OMENTUM. - NO EVIDENCE OF MALIGNANCY. 2. Esophagus, resection, w/ GE junction - FIBROSIS WITH PATCHY CHRONIC INFLAMMATION. - NO RESIDUAL CARCINOMA IDENTIFIED. - MARGINS NOT INVOLVED. - TWELVE LYMPH NODES WITH NO METASTATIC CARCINOMA (0/12).    07/16/2017 Procedure   Upper GI Endoscopy Findings: per Dr. [name] - Food was found in the upper third of the esophagus. Removal of food was accomplished with Jabier Mutton net. - A partial esophagectomy anastomosis was found in the upper third of the esophagus (20 cm from incisors). This was characterized by severe stenosis, an intact staple line and visible sutures. The standard adult upper endoscope would not pass before dilation. A TTS dilator was passed through the scope. Dilation with an 8.5-9.5-10.5 mm and then 06-11-12 mm balloon dilator was performed to 12 mm (inspection after 9.5 mm, 10.5 mm, 11 mm and 12 mm). The dilation site was examined and showed moderate and significant improvement in luminal narrowing. Estimated blood loss was minimal. After dilation to 12 mm the endoscope was able to transverse the anastomosis with minimal pressure. - A medium amount of food (residue) was found in the gastric body. - The exam of the stomach was otherwise normal. - The examined duodenum was normal.   08/13/2017 Procedure   Upper GI Endoscopy Findings: per Dr. [name] - Food was found in the proximal esophagus. Removal of food was accomplished with Jabier Mutton net. - One severe (stenosis; an endoscope cannot pass) benign-appearing, intrinsic stenosis was found 20 cm from the incisors. This measured 1 cm (in length) and was traversed after dilation. A TTS  dilator was passed through the scope. Dilation with an 06-11-12 mm balloon dilator was performed to 13 mm (after 8m and 12 mm). The dilation site was examined and showed moderate improvement in luminal narrowing. At the stricture there is a visible staple on the proximal side and visible suture material on the gastric side. - A medium amount of food (residue) -the duodenum was normal    08/31/2017 Procedure   Endoscopic needle-knife excision of anastomotic stricture [facility]  09/11/2017 Imaging   CT CAP W CONTRAST IMPRESSION: 1. Interval esophagectomy with gastric pull-through. No demonstrated complication. There is retained ingested material within the intrathoracic portion of the stomach. 2. No evidence of local recurrence or metastatic disease. 3. Stable small left supraclavicular and superior mediastinal lymph nodes. 4. Mild lower lobe paramediastinal pulmonary opacity bilaterally, likely atelectasis or sequela of prior radiation therapy. 5.  Aortic Atherosclerosis (ICD10-I70.0).     02/28/2018 Procedure   02/28/2018 Upper endoscopy Impression - Benign-appearing esophageal stenosis. Dilated. - A previous surgical anastomosis was found in the proximal stomach. - Normal examined duodenum. - No specimens collected.   04/10/2018 Imaging   04/10/2018 CT CA IMPRESSION: 1. No evidence of metastatic disease. 2. Aortic atherosclerosis (ICD10-170.0). Coronary artery calcification. 3.  Emphysema (ICD10-J43.9).   03/20/2019 Imaging   CT CAP W contrast 03/20/19  IMPRESSION: 1. Stable exam. No new or progressive interval findings to suggest recurrent/metastatic disease. 2.  Aortic Atherosclerois (ICD10-170.0)   03/30/2019 Procedure   EGD with Dr SFuller Plan8/30/20  IMPRESSION - Food impaction in the proximal esophagus. Removal was successful. 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 - Ectopic gastric mucosa in the proximal esophagus. - Prior esophagectomy, gastric pull up.  Sutures noted in  gastric body. - A medium amount of food (residue) in the stomach that obscured visualization. - Normal duodenal bulb and second portion of the duodenum.   05/26/2019 Procedure   Upper Endoscopy by Dr. [name] 05/26/19  IMPRESSION - Benign-appearing esophageal stenosis at anastomosis. Dilated to 19 mm with balloon. Injected with steroid. 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 - Normal examined duodenum. - No specimens collected.   03/09/2020 Imaging   CT CAP w contrast  IMPRESSION: Stable exam. No evidence of recurrent or metastatic carcinoma within the chest, abdomen, or pelvis.   Aortic Atherosclerosis (ICD10-I70.0). Coronary artery atherosclerosis.   06/03/2021 Procedure   Dr. [name]  IMPRESSION: An esophagogastric anastomosis was found without esophagitis or recurrent malignancy. Dilated to 20 mm with balloon. 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 - Normal examined duodenum. - No specimens collected.   Head and neck cancer ([place])  12/09/2021 Imaging   CT Soft Tissue Neck W Contrast  IMPRESSION: 1. Large, heterogeneous left level 2A lymph node, consistent with metastatic disease. 2. Hyperenhancing appearance of the salivary glands may be a sequela of radiation treatment. 3. Please see the report of the CT of the chest, abdomen and pelvis performed the same day for evaluation of findings below the thoracic inlet.   12/09/2021 Imaging   CT Chest W Contrast, Abdomen, and pelvis   IMPRESSION: 1. Status post pull-through esophagectomy. 2. No evidence of metastatic disease within the chest, abdomen, or pelvis. Specifically, no additional lymphadenopathy identified. 3. Mild, diffuse bilateral bronchial wall thickening and background of fine centrilobular pulmonary nodularity, most commonly seen in smoking-related respiratory bronchiolitis. 4. Coronary artery disease.      12/19/2021 Procedure   [place] CORE BIOPSY     12/19/2021  Pathology Results   CASE: [id number]  A. 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     12/22/2021 Initial Diagnosis   Head and neck cancer ([place])     CURRENT THERAPY: Pending workup for newly diagnosed head/neck squamous cell carcinoma   Oncologic History  To review his history, the patient was diagnosed with stage III esophageal cancer in 2018, underwent neoadjuvant chemoradiation followed by surgery with no residual disease now undergoes surveillance endoscopies, return with the palpable lymphadenopathy in left cervical region.   The area was not initially erythematous or sore in general to palpation.  He denies any other palpable adenopathy. He denies any oral lesions. He is a former smoker having quit in 2014. Denies ever chewing tobacco. He has dentures.   A restaging CT scan was performed. His CAP did not show any concern for disease progression from his esophageal cancer, however, the left cervical node is concerning for metastatic nodal disease. Therefore, this was biopsied on 12/19/21.   Pathology from the lymph node biopsy showed a keratinizing moderately differentiated squamous cell carcinoma p16 positive He had PET/CT in June 2023 which did not show any evidence of primary, hypermetabolic mass or lymphadenopathy in the left upper neck levels 2A and 2B, no other sites of malignancy identified.  MEDICAL HISTORY: Past Medical History:  Diagnosis Date   Allergy    Arthritis     Asthma    as a child   Cancer ([place]) 10/09/2016   ESOPHAGUS CARCINOMA    COPD (chronic obstructive pulmonary disease) (HCC)    CP (cerebral palsy), spastic (HCC)    right   Dysphagia    Dyspnea    with exersion   Emphysema of lung ([place])    Encounter for nonprocreative genetic counseling 10/31/2016   Mr. Viets underwent genetic counseling for hereditary cancer syndromes on 10/31/2016. Though he is a candidate for genetic testing, he declines at this time.   GERD (gastroesophageal reflux disease)    History of chemotherapy    03-2017   History of radiation therapy    03-2017   Hypertension    Neuromuscular disorder (HCC)    [name]   Pneumonia 4 yrs ago    ALLERGIES:  is allergic to penicillins.  MEDICATIONS:  Current Outpatient Medications  Medication Sig Dispense Refill   acetaminophen (TYLENOL) 500 MG tablet Take 500 mg by mouth every 6 (six) hours as needed.     albuterol (VENTOLIN HFA) 108 (90 Base) MCG/ACT inhaler Inhale 2 puffs into the lungs every 6 (six) hours as needed for wheezing or shortness of breath. 8 g 3   atorvastatin (LIPITOR) 20 MG tablet TAKE 1 TABLET BY MOUTH EVERY DAY 90 tablet 3   benzonatate (TESSALON) 100 MG capsule Take 1 capsule (100 mg total) by mouth 3 (three) times daily as needed for cough. 30 capsule 2   Budeson-Glycopyrrol-Formoterol (BREZTRI AEROSPHERE) 160-9-4.8 MCG/ACT AERO Inhale 2 puffs into the lungs 2 (two) times daily. 10.7 g 11   cyclobenzaprine (FLEXERIL) 5 MG tablet Take 1 tablet (5 mg total) by mouth 3 (three) times daily as needed for muscle spasms. 30 tablet 0   fluticasone (FLONASE) 50 MCG/ACT nasal spray SPRAY 1 SPRAY INTO EACH NOSTRIL EVERY DAY AS NEEDED FOR ALLERGIES/RHINITIS 16 mL 2   levETIRAcetam (KEPPRA) 500 MG tablet TAKE 1 TABLET BY MOUTH TWICE A DAY 60 tablet 1   pantoprazole (PROTONIX) 40 MG tablet TAKE 1 TABLET BY MOUTH TWICE A DAY 60 tablet 0   rOPINIRole (REQUIP) 1 MG tablet Take 1 tablet (1 mg total) by mouth at  bedtime. 30 tablet 5   No current facility-administered medications for this visit.    SURGICAL HISTORY:  Past Surgical History:  Procedure Laterality Date   COLONOSCOPY     COMPLETE ESOPHAGECTOMY N/A 04/16/2017   Procedure: ESOPHAGECTOMY COMPLETE,Transhiatal total esophagectomy;  Surgeon: [name], MD;  Location: [facility] OR;  Service: Thoracic;  Laterality: N/A;   ESOPHAGOGASTRODUODENOSCOPY (EGD) WITH PROPOFOL N/A 03/30/2019   Procedure: ESOPHAGOGASTRODUODENOSCOPY (EGD) WITH PROPOFOL;  Surgeon: Ladene Artist, MD;  Location: [facility] ENDOSCOPY;  Service: Endoscopy;  Laterality: N/A;   EUS N/A 10/26/2016   Procedure: UPPER ENDOSCOPIC ULTRASOUND (EUS) LINEAR;  Surgeon: [name], MD;  Location: [facility] ENDOSCOPY;  Service: Endoscopy;  Laterality: N/A;   EYE SURGERY Bilateral age 71    for cross eyes   FOREIGN BODY REMOVAL  03/30/2019   Procedure: FOREIGN BODY REMOVAL;  Surgeon: Ladene Artist, MD;  Location: [facility];  Service: Endoscopy;;   IR FLUORO GUIDE PORT INSERTION RIGHT  11/08/2016   right upper chest   IR REMOVAL TUN ACCESS W/ PORT W/O FL MOD SED  04/12/2020   IR US GUIDE VASC ACCESS RIGHT  11/08/2016   JEJUNOSTOMY N/A 04/16/2017   Procedure: Donney Rankins;  Surgeon: [name], MD;  Location: [facility];  Service: Thoracic;  Laterality: N/A;   JEJUNOSTOMY     removal of feeding tube /pt unsure of date   [facility] ENDOSCOPY  06/03/2021   hx of esoph ca   VIDEO BRONCHOSCOPY N/A 04/16/2017   Procedure: VIDEO BRONCHOSCOPY, Transhiatal Total Esophagectomy, Esophagogastrostomy, pyloromotomy, Feeding Jejunostomy;  Surgeon: [name], MD;  Location: [place] [facility];  Service: Thoracic;  Laterality: N/A;    REVIEW OF SYSTEMS:   Constitutional: Negative for appetite change, chills, fatigue, fever and unexpected weight change.  HENT: Positive for occasional dysphagia since surgery.  Negative for mouth sores, nosebleeds, sore throat . Eyes: Negative for eye problems and icterus.  Respiratory: Positive for stable dyspnea on exertion and slightly worsening chronic cough.  Negative for hemoptysis and wheezing.   Cardiovascular: Negative for chest pain and leg swelling.  Gastrointestinal: Positive for mild intermittent LUQ pain with certain movements. Positive for occasional dysphagia/"regurgitation". Negative for constipation, diarrhea, nausea and vomiting.  Genitourinary: Negative for bladder incontinence, difficulty urinating, dysuria, frequency and hematuria.   Musculoskeletal: Negative for back pain, gait problem, neck pain and neck stiffness.  Skin: Negative for itching and rash.  Neurological: Negative for dizziness, extremity weakness, gait problem, headaches, light-headedness and seizures.  Hematological: Positive for left cervical lymph node enlargement. Does not bruise/bleed easily.  Psychiatric/Behavioral: Negative for confusion, depression and sleep disturbance. The patient is not nervous/anxious.    PHYSICAL EXAMINATION:  Blood pressure 110/69, pulse 84, temperature 97.9 F (36.6 C), temperature source Temporal, resp. rate 16, height '4\' 11"'$  (1.499 m), weight 124 lb 14.4 oz (56.7 kg), SpO2 100 %.  ECOG PERFORMANCE STATUS: 1  Physical Exam  Constitutional: Oriented to person, place, and time and well-developed, well-nourished, and in no distress.  HENT:  Head: Normocephalic and atraumatic.  Mouth/Throat: Oropharynx is clear and moist. No oropharyngeal exudate.  Eyes: Conjunctivae are normal. Right eye exhibits no discharge. Left eye exhibits no discharge. No scleral icterus.  Neck: Normal range of motion. Neck supple.  Cardiovascular: Normal rate, regular rhythm, normal heart sounds and intact distal pulses.   Pulmonary/Chest: Effort normal and breath sounds normal. No respiratory distress. No wheezes. No rales.  Abdominal: Soft. Bowel sounds are normal.  Exhibits no distension and no mass. There is no tenderness.  Musculoskeletal: Normal range of motion. Exhibits no edema.  Lymphadenopathy:  Left level II LN, measuring about 3 cms in largest dimension. Neurological: Alert and oriented to person, place, and time. Exhibits normal muscle tone. Gait normal. Coordination normal.  Skin: Skin is warm and dry. No rash noted. Not diaphoretic. No erythema. No pallor.  Psychiatric: Mood, memory and judgment normal.  Vitals reviewed.  LABORATORY DATA: Lab Results  Component Value Date   WBC 7.2 11/22/2021   HGB 13.5 11/22/2021   HCT 40.5 11/22/2021   MCV 91.8 11/22/2021   PLT 221 11/22/2021      Chemistry      Component Value Date/Time   NA 138 11/22/2021 1456   NA 137 06/14/2017 0840   K 3.7 11/22/2021 1456   K 3.9 06/14/2017 0840   CL 104 11/22/2021 1456   CO2 28 11/22/2021 1456   CO2 22 06/14/2017 0840   BUN 16 11/22/2021 1456   BUN 10.2 06/14/2017 0840   CREATININE 0.77 11/22/2021 1456   CREATININE 0.7 06/14/2017 0840      Component Value  Date/Time   CALCIUM 9.1 11/22/2021 1456   CALCIUM 9.5 06/14/2017 0840   ALKPHOS 83 11/22/2021 1456   ALKPHOS 132 06/14/2017 0840   AST 17 11/22/2021 1456   AST 22 06/14/2017 0840   ALT 13 11/22/2021 1456   ALT 26 06/14/2017 0840   BILITOT 0.5 11/22/2021 1456   BILITOT 0.38 06/14/2017 0840       RADIOGRAPHIC STUDIES:  NM PET Image Initial (PI) Skull Base To Thigh  Result Date: 12/29/2021 CLINICAL DATA:  Initial treatment strategy for head and neck squamous cell carcinoma. Personal history of esophageal carcinoma. EXAM: NUCLEAR MEDICINE PET SKULL BASE TO THIGH TECHNIQUE: 7.0 mCi F-18 FDG was injected intravenously. Full-ring PET imaging was performed from the skull base to thigh after the radiotracer. CT data was obtained and used for attenuation correction and anatomic localization. Fasting blood glucose: 90 mg/dl COMPARISON:  CT on 12/09/2021 FINDINGS: Mediastinal blood-pool activity  (background): SUV max = 2.2 Liver activity (reference): SUV max = N/A NECK: Hypermetabolic mass or lymphadenopathy is seen in the left upper neck in levels 2A and 2B, which measures 4.2 x 2.2 cm on image 74/3. This is hypermetabolic, with SUV max of 8.3. No other hypermetabolic masses or lymphadenopathy seen within the neck. Incidental CT findings:  None. CHEST: No hypermetabolic lymph nodes. No suspicious pulmonary nodules seen on CT images. Incidental CT findings: Prior esophagectomy with gastric pull-through noted. Aortic and coronary atherosclerotic calcification incidentally noted. ABDOMEN/PELVIS: No abnormal hypermetabolic activity within the liver, pancreas, adrenal glands, or spleen. No hypermetabolic lymph nodes in the abdomen or pelvis. Incidental CT findings: Aortic atherosclerotic calcification incidentally noted. SKELETON: No focal hypermetabolic bone lesions to suggest skeletal metastasis. Incidental CT findings:  None. IMPRESSION: Hypermetabolic mass or lymphadenopathy in the left upper neck, levels 2A and 2B. No other sites of malignancy identified. Aortic Atherosclerosis (ICD10-I70.0). Electronically Signed   By: Marlaine Hind M.D.   On: 12/29/2021 11:12   [place] CORE BIOPSY (LYMPH NODES)  Result Date: 12/19/2021 INDICATION: Enlarged cervical lymph node, history of esophageal cancer EXAM: Ultrasound-guided core needle biopsy of left cervical lymph node MEDICATIONS: None. ANESTHESIA/SEDATION: Local analgesia COMPLICATIONS: None immediate. PROCEDURE: Informed written consent was obtained from the patient after a thorough discussion of the procedural risks, benefits and alternatives. All questions were addressed. Maximal Sterile Barrier Technique was utilized including caps, mask, sterile gowns, sterile gloves, sterile drape, hand hygiene and skin antiseptic. A timeout was performed prior to the initiation of the procedure. The patient was placed supine on the exam table. Limited ultrasound of the left  neck was performed for planning purposes. This again demonstrated an enlarged left cervical lymph node, consistent with recent CT of the neck. Skin entry site was marked, and the overlying skin was prepped and draped in the standard sterile fashion. Local analgesia was obtained with 1% lidocaine. Under ultrasound guidance, core needle biopsy was performed of the target left cervical lymph node using an 18 gauge core biopsy device x5 passes. Specimens were submitted in saline to pathology for further handling. Limited postprocedure imaging demonstrated no enlarging hematoma. Clean dressing was placed after hemostasis. The patient tolerated the procedure well without immediate complication. IMPRESSION: Successful ultrasound-guided core needle biopsy of enlarged left cervical lymph node. Electronically Signed   By: Albin Felling M.D.   On: 12/19/2021 14:48   CT Chest W Contrast  Result Date: 12/12/2021 CLINICAL DATA:  Esophageal cancer, restaging, newly enlarged cervical lymph nodes, status post chemotherapy and XRT * Tracking Code: BO * EXAM: CT  CHEST, ABDOMEN, AND PELVIS WITH CONTRAST TECHNIQUE: Multidetector CT imaging of the chest, abdomen and pelvis was performed following the standard protocol during bolus administration of intravenous contrast. RADIATION DOSE REDUCTION: This exam was performed according to the departmental dose-optimization program which includes automated exposure control, adjustment of the mA and/or kV according to patient size and/or use of iterative reconstruction technique. CONTRAST:  139m OMNIPAQUE IOHEXOL 300 MG/ML SOLN, additional oral enteric contrast COMPARISON:  03/09/2020 FINDINGS: CT CHEST FINDINGS Cardiovascular: Aortic atherosclerosis. Normal heart size. Three-vessel coronary artery calcifications. Trace pericardial effusion. Mediastinum/Nodes: No enlarged mediastinal, hilar, or axillary lymph nodes. Status post pull-through esophagectomy. Hiatal hernia containing transverse  colon and pancreas. Thyroid gland and trachea demonstrate no significant findings. Lungs/Pleura: Mild, diffuse bilateral bronchial wall thickening. Background of fine centrilobular pulmonary nodularity, most concentrated in the lung apices. No pleural effusion or pneumothorax. Musculoskeletal: No chest wall mass or suspicious osseous lesions identified. CT ABDOMEN PELVIS FINDINGS Hepatobiliary: No solid liver abnormality is seen. No gallstones, gallbladder wall thickening, or biliary dilatation. Pancreas: Unremarkable. No pancreatic ductal dilatation or surrounding inflammatory changes. Spleen: Normal in size without significant abnormality. Adrenals/Urinary Tract: Adrenal glands are unremarkable. Kidneys are normal, without renal calculi, solid lesion, or hydronephrosis. Bladder is unremarkable. Stomach/Bowel: Stomach is within normal limits. Appendix appears normal. No evidence of bowel wall thickening, distention, or inflammatory changes. Descending and sigmoid diverticulosis. Vascular/Lymphatic: Aortic atherosclerosis. No enlarged abdominal or pelvic lymph nodes. Reproductive: No mass or other abnormality. Other: No abdominal wall hernia or abnormality. No ascites. Musculoskeletal: No acute osseous findings. IMPRESSION: 1. Status post pull-through esophagectomy. 2. No evidence of metastatic disease within the chest, abdomen, or pelvis. Specifically, no additional lymphadenopathy identified. 3. Mild, diffuse bilateral bronchial wall thickening and background of fine centrilobular pulmonary nodularity, most commonly seen in smoking-related respiratory bronchiolitis. 4. Coronary artery disease. Aortic Atherosclerosis (ICD10-I70.0). Electronically Signed   By: ADelanna AhmadiM.D.   On: 12/12/2021 14:34   CT Abdomen Pelvis W Contrast  Result Date: 12/12/2021 CLINICAL DATA:  Esophageal cancer, restaging, newly enlarged cervical lymph nodes, status post chemotherapy and XRT * Tracking Code: BO * EXAM: CT CHEST,  ABDOMEN, AND PELVIS WITH CONTRAST TECHNIQUE: Multidetector CT imaging of the chest, abdomen and pelvis was performed following the standard protocol during bolus administration of intravenous contrast. RADIATION DOSE REDUCTION: This exam was performed according to the departmental dose-optimization program which includes automated exposure control, adjustment of the mA and/or kV according to patient size and/or use of iterative reconstruction technique. CONTRAST:  1035mOMNIPAQUE IOHEXOL 300 MG/ML SOLN, additional oral enteric contrast COMPARISON:  03/09/2020 FINDINGS: CT CHEST FINDINGS Cardiovascular: Aortic atherosclerosis. Normal heart size. Three-vessel coronary artery calcifications. Trace pericardial effusion. Mediastinum/Nodes: No enlarged mediastinal, hilar, or axillary lymph nodes. Status post pull-through esophagectomy. Hiatal hernia containing transverse colon and pancreas. Thyroid gland and trachea demonstrate no significant findings. Lungs/Pleura: Mild, diffuse bilateral bronchial wall thickening. Background of fine centrilobular pulmonary nodularity, most concentrated in the lung apices. No pleural effusion or pneumothorax. Musculoskeletal: No chest wall mass or suspicious osseous lesions identified. CT ABDOMEN PELVIS FINDINGS Hepatobiliary: No solid liver abnormality is seen. No gallstones, gallbladder wall thickening, or biliary dilatation. Pancreas: Unremarkable. No pancreatic ductal dilatation or surrounding inflammatory changes. Spleen: Normal in size without significant abnormality. Adrenals/Urinary Tract: Adrenal glands are unremarkable. Kidneys are normal, without renal calculi, solid lesion, or hydronephrosis. Bladder is unremarkable. Stomach/Bowel: Stomach is within normal limits. Appendix appears normal. No evidence of bowel wall thickening, distention, or inflammatory changes. Descending and sigmoid diverticulosis. Vascular/Lymphatic:  Aortic atherosclerosis. No enlarged abdominal or pelvic  lymph nodes. Reproductive: No mass or other abnormality. Other: No abdominal wall hernia or abnormality. No ascites. Musculoskeletal: No acute osseous findings. IMPRESSION: 1. Status post pull-through esophagectomy. 2. No evidence of metastatic disease within the chest, abdomen, or pelvis. Specifically, no additional lymphadenopathy identified. 3. Mild, diffuse bilateral bronchial wall thickening and background of fine centrilobular pulmonary nodularity, most commonly seen in smoking-related respiratory bronchiolitis. 4. Coronary artery disease. Aortic Atherosclerosis (ICD10-I70.0). Electronically Signed   By: Delanna Ahmadi M.D.   On: 12/12/2021 14:34   CT Soft Tissue Neck W Contrast  Result Date: 12/11/2021 CLINICAL DATA:  Stage 3 esophageal carcinoma. Malignant neoplasm of lower third of esophagus. Cervical adenopathy. EXAM: CT NECK WITH CONTRAST TECHNIQUE: Multidetector CT imaging of the neck was performed using the standard protocol following the bolus administration of intravenous contrast. RADIATION DOSE REDUCTION: This exam was performed according to the departmental dose-optimization program which includes automated exposure control, adjustment of the mA and/or kV according to patient size and/or use of iterative reconstruction technique. CONTRAST:  175m OMNIPAQUE IOHEXOL 300 MG/ML  SOLN COMPARISON:  None Available. FINDINGS: Pharynx and larynx: No pharyngeal or laryngeal abnormality. The cervical esophagus is unremarkable. Salivary glands: Hyperenhancing appearance of the salivary glands may be a sequela of radiation treatment. Thyroid: Normal Lymph nodes: There is a large, heterogeneous left level 2A lymph node measuring 1.9 cm. There are no other enlarged or abnormal density cervical lymph nodes. Vascular: Mild atherosclerotic calcification of the right carotid bifurcation. Limited intracranial: Normal Visualized orbits: Normal Mastoids and visualized paranasal sinuses: Clear Skeleton: No osseous  lesions. IMPRESSION: 1. Large, heterogeneous left level 2A lymph node, consistent with metastatic disease. 2. Hyperenhancing appearance of the salivary glands may be a sequela of radiation treatment. 3. Please see the report of the CT of the chest, abdomen and pelvis performed the same day for evaluation of findings below the thoracic inlet. Electronically Signed   By: KUlyses JarredM.D.   On: 12/11/2021 20:05     ASSESSMENT/PLAN:  DEQUAN COGBILLis a 61y.o. male with    1. Low esophageal Adenocarcinoma, cT3N1M0, stage III, ypT0N0  -He was diagnosed in 09/2016. He is s/p neoadjuvant ChemoRT and esophagectomy. he achieved complete pathological response to neoadjuvant therapy  -His risk of recurrence is low. Dr. FBurr Medicorecommended cancer surveillance for 5 years. -He has had multiple EGD for dilatation. His last EGD with Dr PHilarie Fredricksonin 05/2021 was benign. Last CT CAP in 02/2020 was NED.  -Presently, no evidence of disease recurrence from his prior esophageal adenocarcinoma.   2. 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  -The patient endorsed a new left cervical lymph node enlarged in March 2023 -CT of the neck noted large heterogeneous left level 2A lymph node consistent with metastatic disease.  There is also hyperenhancing appearance of the salivary glands which was reported to be sequela of radiation treatment most likely -The patient underwent a ultrasound-guided biopsy of this lymph node and the pathology was consistent with keratinizing squamous cell carcinoma. This was also noted to be p16 positive.  PET scan with no evidence of primary, no metastatic disease.   He will go through targeted biopsies of the mucosal axis and possibly neck dissection.  If the primary is not determined, he might receive radiation to the upper throat and bilateral neck.  If the primary is determined, he may receive more focal radiation. If he is T0N1 HPV positive/ Stage I HPV positive,  recommendation  would be upfront surgery vs def RT. There is no significant role for concurrent CRT in this setting unless he is found to have any adverse pathologic features after surgery. If he does not need any chemotherapy for management of his head and neck cancer, I might refer him back to Dr. Burr Medico for long-term follow-up of the adenocarcinoma of his esophagus.  2. Malnutrition and weight loss, Esophageal stricture  --He has had multiple esophageal stretch. He had esophageal obstruction due to food impaction in 03/2019.  -Last EGD in 05/2021. There was no evidence of diseases recurrence.  -His weight today is about 125 lbs, he may have lost a couple lbs. -S/p treatment and surgery he still has productive cough and will spit out phlegm as needed.    3. History of heavy smoking -He has quit smoking completely in 2014, history of 40 pack year since age 74.  -PCP manages his COPD. Compliant with inhalers.     Plan:As mentioned above   No orders of the defined types were placed in this encounter.   Yerachmiel Spinney, PA-C 01/06/22  I spent 45 minutes in the care of this patient including History, review of records, counseling, documentation and coordination of care. Discussed plan of care with Dr Isidore Moos.

## 2022-01-10 ENCOUNTER — Telehealth: Payer: Self-pay | Admitting: Hematology and Oncology

## 2022-01-10 DIAGNOSIS — C4442 Squamous cell carcinoma of skin of scalp and neck: Secondary | ICD-10-CM | POA: Diagnosis not present

## 2022-01-10 DIAGNOSIS — C7989 Secondary malignant neoplasm of other specified sites: Secondary | ICD-10-CM | POA: Insufficient documentation

## 2022-01-10 NOTE — Telephone Encounter (Signed)
Scheduled appointment per 06/09 los. Patient aware.

## 2022-01-13 ENCOUNTER — Other Ambulatory Visit: Payer: Self-pay | Admitting: Family Medicine

## 2022-01-13 DIAGNOSIS — K219 Gastro-esophageal reflux disease without esophagitis: Secondary | ICD-10-CM

## 2022-01-13 NOTE — Telephone Encounter (Signed)
Requested Prescriptions  Pending Prescriptions Disp Refills  . pantoprazole (PROTONIX) 40 MG tablet [Pharmacy Med Name: PANTOPRAZOLE SOD DR 40 MG TAB] 60 tablet 4    Sig: TAKE 1 TABLET BY MOUTH TWICE A DAY     Gastroenterology: Proton Pump Inhibitors Passed - 01/13/2022 11:13 AM      Passed - Valid encounter within last 12 months    Recent Outpatient Visits          6 months ago New onset seizure ([place])   [place] Pickard, [name], MD   1 year ago Prostate cancer screening   [place] [name], MD   1 year ago COPD exacerbation [facility])   [facility] Eulogio Bear, NP   1 year ago Malignant neoplasm of lower third of esophagus ([place])   [place] [name], MD   4 years ago Hypotension due to drugs   [place] Pickard, [name], MD

## 2022-01-16 ENCOUNTER — Ambulatory Visit: Payer: Medicaid Other

## 2022-01-16 ENCOUNTER — Ambulatory Visit: Payer: Medicaid Other | Admitting: Radiation Oncology

## 2022-01-16 ENCOUNTER — Telehealth: Payer: Self-pay

## 2022-01-16 NOTE — Telephone Encounter (Signed)
Called and spoke with patient to let him know that per Dr. Isidore Moos, his CT simulation appointment this afternoon is canceled until after his biopsies (and their results) are completed by Dr. Constance Holster. Patient verbalized understanding and appreciation of call.

## 2022-01-16 NOTE — H&P (Signed)
HPI:   Darrell Cunningham is a 61 y.o. male who presents as a consult Patient.   Referring Provider: Ronal Cunningham *  Chief complaint: Neck mass with cancer.  HPI: Presented with a left neck mass. He underwent imaging and needle aspiration biopsy which revealed squamous cell carcinoma. PET scan positive at the neck mass but no other activity noted in the head neck. CT scan reveals potential for infiltrating extranodal extension. Here for work-up and assistance with treatment planning. He was discussed at tumor board last week. He does not smoke or drink. He is edentulous. History of esophageal cancer treated about 5 years ago with chemo/XRT followed by esophagectomy with gastric pull-up.  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 No pertinent surgical history.  No family history of bleeding disorders, wound healing problems or difficulty with anesthesia.   Social History   Socioeconomic History   Marital status: Single  Spouse name: Not on file   Number of children: Not on file   Years of education: Not on file   Highest education level: Not on file  Occupational History   Not on file  Tobacco Use   Smoking status: Former  Packs/day: 1.00  Years: 49.00  Pack years: 49.00  Types: Cigarettes  Start date: 59  Quit date: 10/31/2012  Years since quitting: 9.2   Smokeless tobacco: Never  Substance and Sexual Activity   Alcohol use: Not on file  Comment: stopped 23 years ago   Drug use: Not on file   Sexual activity: Not on file  Other Topics Concern   Not on file  Social History Narrative   Not on file   Social Determinants of Health   Financial Resource Strain: Not on file  Food Insecurity: Not on file  Transportation Needs: Not on file  Physical Activity: Not on file  Stress: Not on file  Social Connections: Not on file  Housing Stability: Not  on file   Current Outpatient Medications:   albuterol 90 mcg/actuation inhaler, Inhale into the lungs., Disp: , Rfl:   budesonide-glycopyr-formoterol (BREZTRI AEROSPHERE) 160-9-4.8 mcg/actuation inhaler, Inhale into the lungs., Disp: , Rfl:   acetaminophen (TYLENOL) 500 MG tablet, Take 2 tablets (1,000 mg total) by mouth every 6 (six) hours as needed., Disp: , Rfl:   acetaminophen (TYLENOL) 500 MG tablet, Take 1 tablet (500 mg total) by mouth., Disp: , Rfl:   atorvastatin (LIPITOR) 20 MG tablet, Take 1 tablet (20 mg total) by mouth daily., Disp: , Rfl:   cyclobenzaprine (FLEXERIL) 5 MG tablet, Take 1 tablet (5 mg total) by mouth., Disp: , Rfl:   fluticasone propionate (FLONASE) 50 mcg/actuation nasal spray, SPRAY 1 SPRAY INTO EACH NOSTRIL EVERY DAY AS NEEDED FOR ALLERGIES/RHINITIS, Disp: , Rfl:   levETIRAcetam (KEPPRA) 500 MG tablet, Take 1 tablet (500 mg total) by mouth 2 times daily., Disp: , Rfl:   pantoprazole (PROTONIX) 40 MG tablet, Take 1 tablet (40 mg total) by mouth 2 times daily., Disp: , Rfl:   rOPINIRole (REQUIP) 1 MG tablet, Take 1 tablet (1 mg total) by mouth nightly., Disp: , Rfl:   VENTOLIN HFA 90 mcg/actuation inhaler, TAKE 2 PUFFS BY MOUTH EVERY 6 HOURS AS NEEDED FOR WHEEZE OR SHORTNESS OF BREATH, Disp: , Rfl:   A complete ROS was performed with pertinent positives/negatives noted in the HPI. The remainder of the ROS are negative.   Physical  Exam:   Temp 98.1 F (36.7 C) (Temporal)  Wt 56.7 kg (125 lb)   General: Healthy and alert, in no distress, breathing easily. Normal affect. In a pleasant mood. Head: Normocephalic, atraumatic. No masses, or scars. Eyes: Pupils are equal, and reactive to light. Vision is grossly intact. No spontaneous or gaze nystagmus. Ears: Ear canals are clear. Tympanic membranes are intact, with normal landmarks and the middle ears are clear and healthy. Hearing: Grossly normal. Nose: Nasal cavities are clear with healthy mucosa, no polyps or  exudate. Airways are patent. Face: No masses or scars, facial nerve function is symmetric. Oral Cavity: No mucosal abnormalities are noted. Tongue with normal mobility. Dentition appears healthy. Oropharynx: Tonsils are symmetric. There are no mucosal masses identified. Tongue base appears normal and healthy. Larynx/Hypopharynx: indirect exam reveals healthy, mobile vocal cords, without mucosal lesions in the hypopharynx or larynx. Chest: Deferred Neck: No palpable masses, no cervical adenopathy, no thyroid nodules or enlargement. Neuro: Cranial nerves II-XII with normal function. Balance: Normal gate. Other findings: none.  Independent Review of Additional Tests or Records:  PET scan:  IMPRESSION:  Hypermetabolic mass or lymphadenopathy in the left upper neck,  levels 2A and 2B.   No other sites of malignancy identified.   Aortic Atherosclerosis (ICD10-I70.0).   CT neck:  IMPRESSION:  1. Large, heterogeneous left level 2A lymph node, consistent with  metastatic disease.  2. Hyperenhancing appearance of the salivary glands may be a sequela  of radiation treatment.  3. Please see the report of the CT of the chest, abdomen and pelvis  performed the same day for evaluation of findings below the thoracic  inlet.   Core biopsy:  SURGICAL PATHOLOGY  * THIS IS AN ADDENDUM REPORT *  CASE: [id number]  PATIENT: Darrell Cunningham  Surgical Pathology Report  *Addendum *   Reason for Addendum #1:  Molecular Genetic Test Results, FISH   Clinical History: Esophageal cancer ([facility])   FINAL MICROSCOPIC DIAGNOSIS:   A. LEFT CERVICAL LYMPH NODE, BIOPSY:  Keratinizing moderately differentiated squamous cell carcinoma (p16  positive)   CT chest:  IMPRESSION:  1. Status post pull-through esophagectomy.  2. No evidence of metastatic disease within the chest, abdomen, or  pelvis. Specifically, no additional lymphadenopathy identified.  3. Mild, diffuse bilateral bronchial wall  thickening and background  of fine centrilobular pulmonary nodularity, most commonly seen in  smoking-related respiratory bronchiolitis.  4. Coronary artery disease.   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 We will schedule him for exam under anesthesia with endoscopy and biopsies including possible tonsillectomy as soon as possible so that we can identify the primary site and then initiate treatment. He understands and agrees.

## 2022-01-23 ENCOUNTER — Telehealth: Payer: Self-pay

## 2022-01-25 ENCOUNTER — Telehealth: Payer: Self-pay

## 2022-01-25 ENCOUNTER — Inpatient Hospital Stay: Payer: Medicaid Other | Admitting: Hematology and Oncology

## 2022-01-25 NOTE — Telephone Encounter (Signed)
Per pt call stated that he thinks that he has a UTI because he sometimes get a burning sensation when urinate then it goes away. Per pt he tried taking some over the counter meds for UTI.   Would like to know if he can can get a Rx for UTI due he will be having surgery on Friday at 11am.   Please advice

## 2022-01-26 ENCOUNTER — Other Ambulatory Visit: Payer: Self-pay | Admitting: Family Medicine

## 2022-01-26 ENCOUNTER — Other Ambulatory Visit: Payer: Self-pay

## 2022-01-26 ENCOUNTER — Encounter (HOSPITAL_COMMUNITY): Payer: Self-pay | Admitting: Otolaryngology

## 2022-01-26 MED ORDER — SULFAMETHOXAZOLE-TRIMETHOPRIM 800-160 MG PO TABS: 1.0000 | ORAL_TABLET | Freq: Two times a day (BID) | ORAL | 0 refills | Status: DC

## 2022-01-26 NOTE — Progress Notes (Addendum)
PCP - Dr. Jenna Luo Cardiologist - denies  PPM/ICD - denies   Chest x-ray - 11/08/17 EKG - 06/20/21 Stress Test - denies ECHO - denies Cardiac Cath - denies  CPAP - denies  DM- denies  ASA/Blood Thinner Instructions: n/a   ERAS Protcol - no, NPO  COVID TEST- pt will be tested on day of surgery  Anesthesia review: no  Patient verbally denies any shortness of breath, fever, cough and chest pain during phone call   -------------  SDW INSTRUCTIONS given:  Your procedure is scheduled on 6/30.  Report to [facility] Main Entrance "A" at 08:00 A.M., and check in at the Admitting office.  Call this number if you have problems the morning of surgery:  [phone]   Remember:  Do not eat or drink after midnight the night before your surgery     Take these medicines the morning of surgery with A SIP OF WATER  acetaminophen (TYLENOL) PRN albuterol (VENTOLIN HFA) 108 PRN- bring with you atorvastatin (LIPITOR) Budeson-Glycopyrrol-Formoterol (BREZTRI AEROSPHERE)  cyclobenzaprine (FLEXERIL) PRN fluticasone (FLONASE) PRN levETIRAcetam (KEPPRA) pantoprazole (PROTONIX) sulfamethoxazole-trimethoprim (BACTRIM DS)  As of today, STOP taking any Aspirin (unless otherwise instructed by your surgeon) Aleve, Naproxen, Ibuprofen, Motrin, Advil, Goody's, BC's, all herbal medications, fish oil, and all vitamins.                      Do not wear jewelry            Do not wear lotions, powders, colognes, or deodorant.            Men may shave face and neck.            Do not bring valuables to the hospital.            [facility] is not responsible for any belongings or valuables.  Do NOT Smoke (Tobacco/Vaping) 24 hours prior to your procedure If you use a CPAP at night, you may bring all equipment for your overnight stay.   Contacts, glasses, dentures or bridgework may not be worn into surgery.      For patients admitted to the hospital, discharge time will be determined by your  treatment team.   Patients discharged the day of surgery will not be allowed to drive home, and someone needs to stay with them for 24 hours.    Special instructions:   [place]- Preparing For Surgery  Before surgery, you can play an important role. Because skin is not sterile, your skin needs to be as free of germs as possible. You can reduce the number of germs on your skin by washing with CHG (chlorahexidine gluconate) Soap before surgery.  CHG is an antiseptic cleaner which kills germs and bonds with the skin to continue killing germs even after washing.    Oral Hygiene is also important to reduce your risk of infection.  Remember - BRUSH YOUR TEETH THE MORNING OF SURGERY WITH YOUR REGULAR TOOTHPASTE  Please do not use if you have an allergy to CHG or antibacterial soaps. If your skin becomes reddened/irritated stop using the CHG.  Do not shave (including legs and underarms) for at least 48 hours prior to first CHG shower. It is OK to shave your face.  Please follow these instructions carefully.   Shower the NIGHT BEFORE SURGERY and the MORNING OF SURGERY with DIAL Soap.   Pat yourself dry with a CLEAN TOWEL.  Wear CLEAN PAJAMAS to bed the night before surgery  Place CLEAN SHEETS on your bed the night of your first shower and DO NOT SLEEP WITH PETS.   Day of Surgery: Please shower morning of surgery  Wear Clean/Comfortable clothing the morning of surgery Do not apply any deodorants/lotions.   Remember to brush your teeth WITH YOUR REGULAR TOOTHPASTE.   Questions were answered. Patient verbalized understanding of instructions.

## 2022-01-26 NOTE — Progress Notes (Signed)
Pt states he began feeling symptoms of a UTI on Tues 6/27. PCP prescribed abx and pt took OTC AZO. Symptoms have improved. Per Dr. Constance Holster, as long as symptoms do not worsen and pt does not have fever on DOS, surgery will continue as scheduled. Pt made aware

## 2022-01-27 ENCOUNTER — Ambulatory Visit (HOSPITAL_BASED_OUTPATIENT_CLINIC_OR_DEPARTMENT_OTHER): Payer: Medicaid Other | Admitting: Anesthesiology

## 2022-01-27 ENCOUNTER — Ambulatory Visit (HOSPITAL_COMMUNITY)
Admission: RE | Admit: 2022-01-27 | Discharge: 2022-01-27 | Disposition: A | Discharge status: Home / Self Care | Payer: Medicaid Other | Attending: Otolaryngology | Admitting: Otolaryngology

## 2022-01-27 ENCOUNTER — Encounter (HOSPITAL_COMMUNITY)
Admission: RE | Disposition: A | Discharge status: Home / Self Care | Payer: Self-pay | Source: Home / Self Care | Attending: Otolaryngology

## 2022-01-27 ENCOUNTER — Other Ambulatory Visit: Payer: Self-pay

## 2022-01-27 ENCOUNTER — Encounter (HOSPITAL_COMMUNITY): Payer: Self-pay | Admitting: Otolaryngology

## 2022-01-27 ENCOUNTER — Ambulatory Visit (HOSPITAL_COMMUNITY): Payer: Medicaid Other | Admitting: Anesthesiology

## 2022-01-27 DIAGNOSIS — G709 Myoneural disorder, unspecified: Secondary | ICD-10-CM | POA: Diagnosis not present

## 2022-01-27 DIAGNOSIS — Z9049 Acquired absence of other specified parts of digestive tract: Secondary | ICD-10-CM | POA: Insufficient documentation

## 2022-01-27 DIAGNOSIS — J449 Chronic obstructive pulmonary disease, unspecified: Secondary | ICD-10-CM | POA: Diagnosis not present

## 2022-01-27 DIAGNOSIS — C01 Malignant neoplasm of base of tongue: Secondary | ICD-10-CM | POA: Diagnosis not present

## 2022-01-27 DIAGNOSIS — Z87891 Personal history of nicotine dependence: Secondary | ICD-10-CM | POA: Insufficient documentation

## 2022-01-27 DIAGNOSIS — I10 Essential (primary) hypertension: Secondary | ICD-10-CM

## 2022-01-27 DIAGNOSIS — K219 Gastro-esophageal reflux disease without esophagitis: Secondary | ICD-10-CM | POA: Diagnosis not present

## 2022-01-27 DIAGNOSIS — Z8501 Personal history of malignant neoplasm of esophagus: Secondary | ICD-10-CM | POA: Diagnosis not present

## 2022-01-27 DIAGNOSIS — C7989 Secondary malignant neoplasm of other specified sites: Secondary | ICD-10-CM | POA: Insufficient documentation

## 2022-01-27 DIAGNOSIS — C4442 Squamous cell carcinoma of skin of scalp and neck: Secondary | ICD-10-CM

## 2022-01-27 DIAGNOSIS — Z20822 Contact with and (suspected) exposure to covid-19: Secondary | ICD-10-CM | POA: Insufficient documentation

## 2022-01-27 DIAGNOSIS — C799 Secondary malignant neoplasm of unspecified site: Secondary | ICD-10-CM | POA: Diagnosis not present

## 2022-01-27 DIAGNOSIS — Z923 Personal history of irradiation: Secondary | ICD-10-CM | POA: Insufficient documentation

## 2022-01-27 DIAGNOSIS — C801 Malignant (primary) neoplasm, unspecified: Secondary | ICD-10-CM | POA: Diagnosis not present

## 2022-01-27 HISTORY — PX: DIRECT LARYNGOSCOPY: SHX5326

## 2022-01-27 HISTORY — PX: TONSILLECTOMY: SHX5217

## 2022-01-27 LAB — BASIC METABOLIC PANEL
Anion gap: 8 (ref 5–15)
BUN: 13 mg/dL (ref 8–23)
CO2: 22 mmol/L (ref 22–32)
Calcium: 9 mg/dL (ref 8.9–10.3)
Chloride: 108 mmol/L (ref 98–111)
Creatinine, Ser: 0.88 mg/dL (ref 0.61–1.24)
GFR, Estimated: >60 mL/min (ref >60)
Glucose, Bld: 89 mg/dL (ref 70–99)
Potassium: 4 mmol/L (ref 3.5–5.1)
Sodium: 138 mmol/L (ref 135–145)

## 2022-01-27 LAB — CBC
HCT: 39.7 % (ref 39.0–52.0)
Hemoglobin: 13.1 g/dL (ref 13.0–17.0)
MCH: 31.2 pg (ref 26.0–34.0)
MCHC: 33 g/dL (ref 30.0–36.0)
MCV: 94.5 fL (ref 80.0–100.0)
Platelets: 233 10*3/uL (ref 150–400)
RBC: 4.2 MIL/uL — ABNORMAL LOW (ref 4.22–5.81)
RDW: 13.2 % (ref 11.5–15.5)
WBC: 7.3 10*3/uL (ref 4.0–10.5)
nRBC: 0 % (ref 0.0–0.2)

## 2022-01-27 LAB — SARS CORONAVIRUS 2 BY RT PCR: SARS Coronavirus 2 by RT PCR: NEGATIVE

## 2022-01-27 SURGERY — LARYNGOSCOPY, DIRECT
Anesthesia: General

## 2022-01-27 MED ORDER — ONDANSETRON HCL 4 MG/2ML IJ SOLN: 4.0000 mg | Freq: Once | INTRAMUSCULAR | Status: DC | PRN

## 2022-01-27 MED ORDER — PROPOFOL 10 MG/ML IV BOLUS
INTRAVENOUS | Status: DC | PRN
  Administered 2022-01-27: 100 mg via INTRAVENOUS

## 2022-01-27 MED ORDER — LACTATED RINGERS IV SOLN: INTRAVENOUS | Status: DC

## 2022-01-27 MED ORDER — ALBUTEROL SULFATE HFA 108 (90 BASE) MCG/ACT IN AERS
INHALATION_SPRAY | RESPIRATORY_TRACT | Status: DC | PRN
  Administered 2022-01-27 (×2): 4 via RESPIRATORY_TRACT

## 2022-01-27 MED ORDER — PHENYLEPHRINE 80 MCG/ML (10ML) SYRINGE FOR IV PUSH (FOR BLOOD PRESSURE SUPPORT)
PREFILLED_SYRINGE | INTRAVENOUS | Status: DC | PRN
  Administered 2022-01-27: 160 ug via INTRAVENOUS

## 2022-01-27 MED ORDER — ONDANSETRON HCL 4 MG/2ML IJ SOLN
INTRAMUSCULAR | Status: AC
  Filled 2022-01-27: qty 2

## 2022-01-27 MED ORDER — DEXAMETHASONE SODIUM PHOSPHATE 10 MG/ML IJ SOLN
INTRAMUSCULAR | Status: DC | PRN
  Administered 2022-01-27: 5 mg via INTRAVENOUS

## 2022-01-27 MED ORDER — DEXAMETHASONE SODIUM PHOSPHATE 10 MG/ML IJ SOLN
INTRAMUSCULAR | Status: AC
Start: 2022-01-27 — End: ?
  Filled 2022-01-27: qty 1

## 2022-01-27 MED ORDER — ONDANSETRON HCL 4 MG/2ML IJ SOLN
INTRAMUSCULAR | Status: DC | PRN
  Administered 2022-01-27: 4 mg via INTRAVENOUS

## 2022-01-27 MED ORDER — FENTANYL CITRATE (PF) 100 MCG/2ML IJ SOLN: 25.0000 ug | INTRAMUSCULAR | Status: DC | PRN

## 2022-01-27 MED ORDER — CHLORHEXIDINE GLUCONATE 0.12 % MT SOLN
OROMUCOSAL | Status: AC
  Administered 2022-01-27: 15 mL via OROMUCOSAL
  Filled 2022-01-27: qty 15

## 2022-01-27 MED ORDER — LIDOCAINE 2% (20 MG/ML) 5 ML SYRINGE
INTRAMUSCULAR | Status: AC
  Filled 2022-01-27: qty 5

## 2022-01-27 MED ORDER — SUGAMMADEX SODIUM 200 MG/2ML IV SOLN
INTRAVENOUS | Status: DC | PRN
  Administered 2022-01-27: 50 mg via INTRAVENOUS
  Administered 2022-01-27: 150 mg via INTRAVENOUS

## 2022-01-27 MED ORDER — PROPOFOL 10 MG/ML IV BOLUS
INTRAVENOUS | Status: AC
  Filled 2022-01-27: qty 20

## 2022-01-27 MED ORDER — CHLORHEXIDINE GLUCONATE 0.12 % MT SOLN
15.0000 mL | Freq: Once | OROMUCOSAL | Status: AC
Start: 2022-01-27 — End: 2022-01-27

## 2022-01-27 MED ORDER — MIDAZOLAM HCL 2 MG/2ML IJ SOLN
INTRAMUSCULAR | Status: DC | PRN
  Administered 2022-01-27: 2 mg via INTRAVENOUS

## 2022-01-27 MED ORDER — FENTANYL CITRATE (PF) 250 MCG/5ML IJ SOLN
INTRAMUSCULAR | Status: AC
  Filled 2022-01-27: qty 5

## 2022-01-27 MED ORDER — KETOROLAC TROMETHAMINE 15 MG/ML IJ SOLN: 15.0000 mg | Freq: Once | INTRAMUSCULAR | Status: DC | PRN

## 2022-01-27 MED ORDER — 0.9 % SODIUM CHLORIDE (POUR BTL) OPTIME
TOPICAL | Status: DC | PRN
  Administered 2022-01-27: 1000 mL

## 2022-01-27 MED ORDER — AMISULPRIDE (ANTIEMETIC) 5 MG/2ML IV SOLN: 10.0000 mg | Freq: Once | INTRAVENOUS | Status: DC | PRN

## 2022-01-27 MED ORDER — ROCURONIUM BROMIDE 10 MG/ML (PF) SYRINGE
PREFILLED_SYRINGE | INTRAVENOUS | Status: DC | PRN
  Administered 2022-01-27: 40 mg via INTRAVENOUS

## 2022-01-27 MED ORDER — LIDOCAINE 2% (20 MG/ML) 5 ML SYRINGE
INTRAMUSCULAR | Status: DC | PRN
  Administered 2022-01-27: 60 mg via INTRAVENOUS

## 2022-01-27 MED ORDER — ORAL CARE MOUTH RINSE
15.0000 mL | Freq: Once | OROMUCOSAL | Status: AC
Start: 2022-01-27 — End: 2022-01-27

## 2022-01-27 MED ORDER — MIDAZOLAM HCL 2 MG/2ML IJ SOLN
INTRAMUSCULAR | Status: AC
  Filled 2022-01-27: qty 2

## 2022-01-27 MED ORDER — PHENYLEPHRINE 80 MCG/ML (10ML) SYRINGE FOR IV PUSH (FOR BLOOD PRESSURE SUPPORT)
PREFILLED_SYRINGE | INTRAVENOUS | Status: AC
Start: 2022-01-27 — End: ?
  Filled 2022-01-27: qty 10

## 2022-01-27 MED ORDER — ROCURONIUM BROMIDE 10 MG/ML (PF) SYRINGE
PREFILLED_SYRINGE | INTRAVENOUS | Status: AC
  Filled 2022-01-27: qty 10

## 2022-01-27 MED ORDER — PROPOFOL 500 MG/50ML IV EMUL
INTRAVENOUS | Status: DC | PRN
  Administered 2022-01-27: 25 ug/kg/min via INTRAVENOUS

## 2022-01-27 MED ORDER — FENTANYL CITRATE (PF) 250 MCG/5ML IJ SOLN
INTRAMUSCULAR | Status: DC | PRN
  Administered 2022-01-27: 150 ug via INTRAVENOUS

## 2022-01-27 MED ORDER — ACETAMINOPHEN 500 MG PO TABS
1000.0000 mg | ORAL_TABLET | Freq: Once | ORAL | Status: AC
  Administered 2022-01-27: 1000 mg via ORAL
  Filled 2022-01-27: qty 2

## 2022-01-27 SURGICAL SUPPLY — 41 items
BAG COUNTER SPONGE SURGICOUNT (BAG) ×3 IMPLANT
BAG SPNG CNTER NS LX DISP (BAG) ×2
BLADE SURG 15 STRL LF DISP TIS (BLADE) IMPLANT
BLADE SURG 15 STRL SS (BLADE)
CANISTER SUCT 3000ML PPV (MISCELLANEOUS) ×3 IMPLANT
CATH ROBINSON RED A/P 10FR (CATHETERS) ×3 IMPLANT
CLEANER TIP ELECTROSURG 2X2 (MISCELLANEOUS) ×3 IMPLANT
COAGULATOR SUCT SWTCH 10FR 6 (ELECTROSURGICAL) ×3 IMPLANT
COVER BACK TABLE 60X90IN (DRAPES) ×3 IMPLANT
COVER MAYO STAND STRL (DRAPES) ×3 IMPLANT
DRAPE HALF SHEET 40X57 (DRAPES) ×3 IMPLANT
ELECT COATED BLADE 2.86 ST (ELECTRODE) ×3 IMPLANT
ELECT REM PT RETURN 9FT ADLT (ELECTROSURGICAL)
ELECT REM PT RETURN 9FT PED (ELECTROSURGICAL)
ELECTRODE REM PT RETRN 9FT PED (ELECTROSURGICAL) IMPLANT
ELECTRODE REM PT RTRN 9FT ADLT (ELECTROSURGICAL) IMPLANT
GAUZE 4X4 16PLY ~~LOC~~+RFID DBL (SPONGE) ×3 IMPLANT
GLOVE ECLIPSE 7.5 STRL STRAW (GLOVE) ×3 IMPLANT
GOWN STRL REUS W/ TWL LRG LVL3 (GOWN DISPOSABLE) ×4 IMPLANT
GOWN STRL REUS W/TWL LRG LVL3 (GOWN DISPOSABLE) ×6
GUARD TEETH (MISCELLANEOUS) IMPLANT
KIT BASIN OR (CUSTOM PROCEDURE TRAY) ×3 IMPLANT
KIT TURNOVER KIT B (KITS) ×3 IMPLANT
NS IRRIG 1000ML POUR BTL (IV SOLUTION) ×3 IMPLANT
PACK SURGICAL SETUP 50X90 (CUSTOM PROCEDURE TRAY) ×3 IMPLANT
PAD ARMBOARD 7.5X6 YLW CONV (MISCELLANEOUS) ×6 IMPLANT
PATTIES SURGICAL .5 X3 (DISPOSABLE) IMPLANT
PENCIL FOOT CONTROL (ELECTRODE) ×3 IMPLANT
SOL ANTI FOG 6CC (MISCELLANEOUS) IMPLANT
SOLUTION ANTI FOG 6CC (MISCELLANEOUS)
SPECIMEN JAR SMALL (MISCELLANEOUS) ×6 IMPLANT
SPONGE TONSIL TAPE 1 RFD (DISPOSABLE) ×3 IMPLANT
SYR BULB EAR ULCER 3OZ GRN STR (SYRINGE) ×3 IMPLANT
TOWEL GREEN STERILE (TOWEL DISPOSABLE) ×3 IMPLANT
TOWEL GREEN STERILE FF (TOWEL DISPOSABLE) ×6 IMPLANT
TUBE CONNECTING 12X1/4 (SUCTIONS) ×3 IMPLANT
TUBE SALEM SUMP 10F W/ARV (TUBING) IMPLANT
TUBE SALEM SUMP 12R W/ARV (TUBING) IMPLANT
TUBE SALEM SUMP 14F W/ARV (TUBING) IMPLANT
TUBE SALEM SUMP 16 FR W/ARV (TUBING) IMPLANT
WATER STERILE IRR 1000ML POUR (IV SOLUTION) ×3 IMPLANT

## 2022-01-27 NOTE — Transfer of Care (Signed)
Immediate Anesthesia Transfer of Care Note  Patient: Darrell Cunningham  Procedure(s) Performed: DIRECT LARYNGOSCOPY WITH BIOPSIES; FROZEN SECTION POSSIBLE TONSILLECTOMY (Bilateral)  Patient Location: PACU  Anesthesia Type:General  Level of Consciousness: drowsy, patient cooperative and responds to stimulation  Airway & Oxygen Therapy: Patient Spontanous Breathing and Patient connected to nasal cannula oxygen  Post-op Assessment: Report given to RN and Post -op Vital signs reviewed and stable  Post vital signs: Reviewed and stable  Last Vitals:  Vitals Value Taken Time  BP    Temp    Pulse    Resp 18 01/27/22 1115  SpO2    Vitals shown include unvalidated device data.  Last Pain:  Vitals:   01/27/22 0928  TempSrc:   PainSc: 0-No pain         Complications: No notable events documented.

## 2022-01-27 NOTE — Anesthesia Preprocedure Evaluation (Addendum)
Anesthesia Evaluation  Patient identified by MRN, date of birth, ID band Patient awake    Reviewed: Allergy & Precautions, NPO status , Patient's Chart, lab work & pertinent test results  History of Anesthesia Complications (+) Family history of anesthesia reaction  Airway Mallampati: II  TM Distance: >3 FB Neck ROM: Full    Dental  (+) Edentulous Upper, Edentulous Lower   Pulmonary asthma , COPD,  COPD inhaler, former smoker,    Pulmonary exam normal        Cardiovascular hypertension, Normal cardiovascular exam     Neuro/Psych CP (cerebral palsy)  Neuromuscular disease negative psych ROS   GI/Hepatic Neg liver ROS, GERD  Medicated and Controlled,S/p COMPLETE ESOPHAGECTOMY   Endo/Other  negative endocrine ROS  Renal/GU negative Renal ROS     Musculoskeletal  (+) Arthritis ,   Abdominal   Peds  Hematology negative hematology ROS (+)   Anesthesia Other Findings Metastatic squamous neck cancer with occult primary  Reproductive/Obstetrics                            Anesthesia Physical Anesthesia Plan  ASA: 3  Anesthesia Plan: General   Post-op Pain Management:    Induction: Intravenous  PONV Risk Score and Plan: 2 and Dexamethasone, Midazolam, Treatment may vary due to age or medical condition, Ondansetron and Propofol infusion  Airway Management Planned: Oral ETT  Additional Equipment:   Intra-op Plan:   Post-operative Plan: Extubation in OR  Informed Consent: I have reviewed the patients History and Physical, chart, labs and discussed the procedure including the risks, benefits and alternatives for the proposed anesthesia with the patient or authorized representative who has indicated his/her understanding and acceptance.       Plan Discussed with: CRNA and Surgeon  Anesthesia Plan Comments:       Anesthesia Quick Evaluation

## 2022-01-27 NOTE — Anesthesia Procedure Notes (Signed)
Procedure Name: Intubation Date/Time: 01/27/2022 10:24 AM  Performed by: Michele Rockers, CRNAPre-anesthesia Checklist: Patient identified, Patient being monitored, Timeout performed, Emergency Drugs available and Suction available Patient Re-evaluated:Patient Re-evaluated prior to induction Oxygen Delivery Method: Circle System Utilized Preoxygenation: Pre-oxygenation with 100% oxygen Induction Type: IV induction Ventilation: Mask ventilation without difficulty Laryngoscope Size: Miller and 2 Grade View: Grade I Tube type: Oral Tube size: 7.5 mm Number of attempts: 1 Airway Equipment and Method: Stylet Placement Confirmation: ETT inserted through vocal cords under direct vision, positive ETCO2 and breath sounds checked- equal and bilateral Secured at: 21 cm Tube secured with: Tape Dental Injury: Teeth and Oropharynx as per pre-operative assessment

## 2022-01-27 NOTE — Interval H&P Note (Signed)
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  The various methods of treatment have been discussed with the patient and family. After consideration of risks, benefits and other options for treatment, the patient has consented to  Procedure(s): DIRECT LARYNGOSCOPY WITH BIOPSIES; FROZEN SECTION (N/A) POSSIBLE TONSILLECTOMY (Bilateral) as a surgical intervention.  The patient's history has been reviewed, patient examined, no change in status, stable for surgery.  I have reviewed the patient's chart and labs.  Questions were answered to the patient's satisfaction.     [name]

## 2022-01-27 NOTE — Op Note (Signed)
OPERATIVE REPORT  DATE OF SURGERY: 01/27/2022  PATIENT:  Darrell Cunningham,  61 y.o. 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  COUNTS:  Correct  PROCEDURE DETAILS: The patient was taken to the operating room and placed on the operating table in the supine position. Following induction of general endotracheal anesthesia, the table was turned 90 and the patient was draped in a standard fashion.  A Jako laryngoscope was used to evaluate the oropharynx, hypopharynx and larynx.  Larynx was clear.  No lesions identified.  The esophageal introitus and the gastric pull-up anastomosis site was all clear and the mucosa was healthy.  Piriform sinuses were healthy and clear bilaterally.  Supraglottic larynx clear bilaterally.  Vallecula clear.  A flat nonpalpable lesion less than 2 cm was identified along the left base of tongue.  Multiple biopsies were taken and sent for frozen section analysis.  Bleeding was controlled with packing and suction.  No other abnormalities were identified.  The scope was removed after the results return from pathology and the patient was awakened extubated and transferred to recovery in stable condition.    PATIENT DISPOSITION:  To PACU, stable

## 2022-01-28 NOTE — Anesthesia Postprocedure Evaluation (Signed)
Anesthesia Post Note  Patient: Darrell Cunningham  Procedure(s) Performed: DIRECT LARYNGOSCOPY WITH BIOPSIES; FROZEN SECTION POSSIBLE TONSILLECTOMY (Bilateral)     Patient location during evaluation: PACU Anesthesia Type: General Level of consciousness: awake Pain management: pain level controlled Vital Signs Assessment: post-procedure vital signs reviewed and stable Respiratory status: spontaneous breathing, nonlabored ventilation, respiratory function stable and patient connected to nasal cannula oxygen Cardiovascular status: blood pressure returned to baseline and stable Postop Assessment: no apparent nausea or vomiting Anesthetic complications: no   No notable events documented.  Last Vitals:  Vitals:   01/27/22 1130 01/27/22 1145  BP: 122/66 118/82  Pulse: 85 75  Resp: 15 19  Temp:  36.6 C  SpO2: 97% 96%    Last Pain:  Vitals:   01/27/22 1145  TempSrc:   PainSc: 0-No pain                 Raffi Milstein P Ayodele Hartsock

## 2022-01-29 ENCOUNTER — Encounter (HOSPITAL_COMMUNITY): Payer: Self-pay | Admitting: Otolaryngology

## 2022-01-30 LAB — SURGICAL PATHOLOGY

## 2022-02-01 ENCOUNTER — Ambulatory Visit
Admission: RE | Admit: 2022-02-01 | Discharge: 2022-02-01 | Disposition: A | Discharge status: Home / Self Care | Payer: Medicaid Other | Source: Ambulatory Visit | Attending: Hematology and Oncology | Admitting: Hematology and Oncology

## 2022-02-01 ENCOUNTER — Other Ambulatory Visit: Payer: Self-pay

## 2022-02-01 ENCOUNTER — Ambulatory Visit
Admission: RE | Admit: 2022-02-01 | Discharge: 2022-02-01 | Disposition: A | Discharge status: Home / Self Care | Payer: Medicaid Other | Source: Ambulatory Visit | Attending: Radiation Oncology | Admitting: Radiation Oncology

## 2022-02-01 VITALS — BP 107/71 | HR 80 | Temp 97.7°F | Resp 20 | Wt 123.0 lb

## 2022-02-01 DIAGNOSIS — C770 Secondary and unspecified malignant neoplasm of lymph nodes of head, face and neck: Secondary | ICD-10-CM | POA: Diagnosis present

## 2022-02-01 DIAGNOSIS — C760 Malignant neoplasm of head, face and neck: Secondary | ICD-10-CM

## 2022-02-01 MED ORDER — SODIUM CHLORIDE 0.9% FLUSH
10.0000 mL | Freq: Once | INTRAVENOUS | Status: AC
  Administered 2022-02-01: 10 mL via INTRAVENOUS

## 2022-02-01 NOTE — Progress Notes (Signed)
Has armband been applied?  Yes.    Does patient have an allergy to IV contrast dye?: No.   Has patient ever received premedication for IV contrast dye?: No.   Date of lab work: January 27, 2022 BUN: 13 CR: 0.88 eGFR: >60  Does patient take metformin?: No.  IV site: wrist right, condition patent and no redness  Has IV site been added to flowsheet?  Yes.    Vitals:   02/01/22 1215  BP: 107/71  Pulse: 80  Resp: 20  Temp: 97.7 F (36.5 C)  SpO2: 100%    

## 2022-02-02 ENCOUNTER — Other Ambulatory Visit: Payer: Self-pay

## 2022-02-02 ENCOUNTER — Encounter: Payer: Self-pay | Admitting: Hematology

## 2022-02-02 ENCOUNTER — Encounter: Payer: Self-pay | Admitting: Licensed Clinical Social Worker

## 2022-02-02 DIAGNOSIS — C760 Malignant neoplasm of head, face and neck: Secondary | ICD-10-CM

## 2022-02-02 NOTE — Progress Notes (Signed)
Oncology Nurse Navigator Documentation   To provide support, encouragement and care continuity, met with Darrell Cunningham before and after his CT SIM. He was accompanied by his mother and daughter. He tolerated procedure without difficulty, denied questions/concerns.   I explained procedures for lobby registration, arrival to Radiation Waiting, and arrival to treatment area.  He voiced understanding.   I encouraged him to call me prior to 02/13/22 [place].   I have also contacted Darrell Cunningham regarding the Darrell Cunningham at the patients request.  Darrell Asa RN, BSN, OCN Head & Neck Oncology Nurse [place] at [facility] Phone # [phone]  Fax # [phone]

## 2022-02-02 NOTE — Progress Notes (Signed)
[place] Work  Clinical Social Work was referred by Art therapist for assessment of psychosocial needs.  Clinical Social Worker contacted patient by phone  to offer support and assess for needs.   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 He has spoken with Vincente Liberty about the [employer] and will bring in his paperwork. CSW shared information on his Medicaid transportation benefit and grants for H&N cancer. Pt will come in tomorrow for printed information.     Kathrynn Backstrom E Jonice Cerra, LCSW  Clinical Social Worker [place]        Patient is participating in a Managed Medicaid Plan:  Yes

## 2022-02-02 NOTE — Telephone Encounter (Addendum)
PA has been approved 01/24/22-01/24/2003 Pt is aware

## 2022-02-03 ENCOUNTER — Inpatient Hospital Stay: Payer: Medicaid Other | Admitting: Licensed Clinical Social Worker

## 2022-02-03 DIAGNOSIS — R590 Localized enlarged lymph nodes: Secondary | ICD-10-CM | POA: Insufficient documentation

## 2022-02-03 DIAGNOSIS — C155 Malignant neoplasm of lower third of esophagus: Secondary | ICD-10-CM | POA: Insufficient documentation

## 2022-02-03 DIAGNOSIS — Z87891 Personal history of nicotine dependence: Secondary | ICD-10-CM | POA: Insufficient documentation

## 2022-02-03 DIAGNOSIS — R1310 Dysphagia, unspecified: Secondary | ICD-10-CM | POA: Insufficient documentation

## 2022-02-03 DIAGNOSIS — J439 Emphysema, unspecified: Secondary | ICD-10-CM | POA: Insufficient documentation

## 2022-02-03 DIAGNOSIS — I700 Atherosclerosis of aorta: Secondary | ICD-10-CM | POA: Insufficient documentation

## 2022-02-03 DIAGNOSIS — Z9049 Acquired absence of other specified parts of digestive tract: Secondary | ICD-10-CM | POA: Insufficient documentation

## 2022-02-03 DIAGNOSIS — E46 Unspecified protein-calorie malnutrition: Secondary | ICD-10-CM | POA: Insufficient documentation

## 2022-02-03 DIAGNOSIS — Z880 Allergy status to penicillin: Secondary | ICD-10-CM | POA: Insufficient documentation

## 2022-02-03 DIAGNOSIS — I2510 Atherosclerotic heart disease of native coronary artery without angina pectoris: Secondary | ICD-10-CM | POA: Insufficient documentation

## 2022-02-03 DIAGNOSIS — Z923 Personal history of irradiation: Secondary | ICD-10-CM | POA: Insufficient documentation

## 2022-02-03 DIAGNOSIS — C01 Malignant neoplasm of base of tongue: Secondary | ICD-10-CM | POA: Insufficient documentation

## 2022-02-03 DIAGNOSIS — Z79899 Other long term (current) drug therapy: Secondary | ICD-10-CM | POA: Insufficient documentation

## 2022-02-03 DIAGNOSIS — C760 Malignant neoplasm of head, face and neck: Secondary | ICD-10-CM | POA: Diagnosis not present

## 2022-02-03 DIAGNOSIS — Z9221 Personal history of antineoplastic chemotherapy: Secondary | ICD-10-CM | POA: Insufficient documentation

## 2022-02-03 NOTE — Progress Notes (Signed)
[place] Work  Initial Assessment   Darrell Cunningham is a 61 y.o. year old male presenting alone. Clinical Social Work was referred by nurse navigator for assessment of psychosocial needs.   SDOH (Social Determinants of Health) assessments performed: Yes SDOH Interventions    Flowsheet Row Most Recent Value  SDOH Interventions   Financial Strain Interventions Development worker, community, Other (Comment)  [cancer foundations]  Housing Interventions Intervention Not Indicated  Transportation Interventions Payor Benefit       SDOH Screenings   Alcohol Screen: Not on file  Depression (PHQ2-9): Low Risk  (01/11/2021)   Depression (PHQ2-9)    PHQ-2 Score: 0  Financial Resource Strain: Medium Risk (02/03/2022)   Overall Financial Resource Strain (CARDIA)    Difficulty of Paying Living Expenses: Somewhat hard  Food Insecurity: Not on file  Housing: Low Risk  (02/03/2022)   Housing    Last Housing Risk Score: 0  Physical Activity: Not on file  Social Connections: Not on file  Stress: Not on file  Tobacco Use: Medium Risk (01/29/2022)   Patient History    Smoking Tobacco Use: Former    Smokeless Tobacco Use: Never    Passive Exposure: Not on file  Transportation Needs: No Transportation Needs (02/03/2022)   PRAPARE - Hydrologist (Medical): No    Lack of Transportation (Non-Medical): No     Distress Screen completed: No   Family/Social Information:  Housing Arrangement: patient lives with daughter who is 2yo Family members/support persons in your life? Family- daughter helps at home (doesn't drive) and mom helps some Transportation concerns: yes, cost of gas and rides if he cannot drive himself  Employment: Legally disabled. 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  Income source: Theatre stage manager Income Financial concerns: Yes, due to illness and/or loss of work during treatment Type of concern: Biomedical scientist access concerns: no Religious or spiritual practice: Not known Services Currently in place:  SS[facility]. Healthy blue medicaid  Coping/ Adjustment to diagnosis: Patient understands treatment plan and what happens next? yes, he has been through cancer before although it was a different type. 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 Seizures from other medical concerns Hopes and/or priorities: be treated and be cancer free again Patient enjoys being outside and time with family/ friends Current coping skills/ strengths: Capable of independent living  and Supportive family/friends     SUMMARY: Current SDOH Barriers:  Financial constraints related to increased costs during treatment  Clinical Social Work Clinical Goal(s):  Explore community resource options for unmet needs related to:  Curator Strain   Interventions: Discussed common feeling and emotions when being diagnosed with cancer, and the importance of support during treatment Informed patient of the support team roles and support services at [facility] Provided [place] contact information and encouraged patient to call with any questions or concerns Completed application for gas card from [facility] [place] up for and gave first [place] card Provided information on transportation benefit through insurance, including gas reimbursement, and on application for assistance from [employer] information to [place]. for [employer]   Follow Up Plan: CSW will see patient on 7/17 to work on [facility] Living application Patient verbalizes understanding of plan: Yes    Deauna Yaw E Roney Youtz,  LCSW   Patient is participating in a Managed Medicaid Plan:  Yes

## 2022-02-06 ENCOUNTER — Ambulatory Visit: Payer: Medicaid Other

## 2022-02-07 ENCOUNTER — Ambulatory Visit: Payer: Medicaid Other

## 2022-02-08 ENCOUNTER — Ambulatory Visit: Payer: Medicaid Other

## 2022-02-09 ENCOUNTER — Ambulatory Visit: Payer: Medicaid Other

## 2022-02-10 ENCOUNTER — Ambulatory Visit: Payer: Medicaid Other

## 2022-02-13 ENCOUNTER — Other Ambulatory Visit: Payer: Self-pay

## 2022-02-13 ENCOUNTER — Inpatient Hospital Stay: Payer: Medicaid Other | Admitting: Licensed Clinical Social Worker

## 2022-02-13 ENCOUNTER — Ambulatory Visit: Payer: Medicaid Other

## 2022-02-13 ENCOUNTER — Ambulatory Visit
Admission: RE | Admit: 2022-02-13 | Discharge: 2022-02-13 | Disposition: A | Discharge status: Home / Self Care | Payer: Medicaid Other | Source: Ambulatory Visit | Attending: Radiation Oncology | Admitting: Radiation Oncology

## 2022-02-13 ENCOUNTER — Other Ambulatory Visit: Payer: Self-pay | Admitting: Radiation Oncology

## 2022-02-13 DIAGNOSIS — C760 Malignant neoplasm of head, face and neck: Secondary | ICD-10-CM | POA: Diagnosis not present

## 2022-02-13 DIAGNOSIS — C770 Secondary and unspecified malignant neoplasm of lymph nodes of head, face and neck: Secondary | ICD-10-CM

## 2022-02-13 LAB — RAD ONC ARIA SESSION SUMMARY
Course Elapsed Days: 0
Plan Fractions Treated to Date: 1
Plan Prescribed Dose Per Fraction: 2 Gy
Plan Total Fractions Prescribed: 35
Plan Total Prescribed Dose: 70 Gy
Reference Point Dosage Given to Date: 2 Gy
Reference Point Session Dosage Given: 2 Gy
Session Number: 1

## 2022-02-13 MED ORDER — SONAFINE EX EMUL
1.0000 | Freq: Two times a day (BID) | CUTANEOUS | Status: DC
  Administered 2022-02-13: 1 via TOPICAL

## 2022-02-13 MED ORDER — LIDOCAINE VISCOUS HCL 2 % MT SOLN: OROMUCOSAL | 3 refills | Status: DC

## 2022-02-13 NOTE — Progress Notes (Signed)
[place] CSW Progress Note  Holiday representative met with patient, daughter, and mom to complete [facility] application. Submitted to foundation for potential assistance with gas for treatment.  Also provided [employer] and bag of house items from food pantry.    Scott Fix E Ivy Puryear, LCSW    Patient is participating in a Managed Medicaid Plan:  Yes

## 2022-02-13 NOTE — Progress Notes (Signed)
Pt here for patient teaching.    Pt given Radiation and You booklet, Managing Acute Radiation Side Effects for Head and Neck Cancer handout, skin care instructions, and Sonafine.    Reviewed areas of pertinence such as fatigue, hair loss in treatment field, mouth changes, throat changes, earaches, and taste changes .   Pt able to give teach back of to pat skin, use unscented/gentle soap, and drink plenty of water,apply Sonafine bid, avoid applying anything to skin within 4 hours of treatment, and to use an electric razor if they must shave.   Pt demonstrated understanding and verbalizes understanding of information given and will contact nursing with any questions or concerns.    [email or web address]

## 2022-02-13 NOTE — Progress Notes (Signed)
Oncology Nurse Navigator Documentation   To provide support, encouragement and care continuity, met with Darrell Cunningham for his initial RT.  He was accompanied by his mother and daughter. I reviewed the 2-step treatment process, answered questions.  Darrell Cunningham completed treatment without difficulty, denied questions/concerns. I reviewed the registration/arrival procedure for subsequent treatments. I provided him with a calendar of upcoming appointments including nutrition, PT, SLP.  I encouraged them to call me with questions/concerns as tmts proceed.   [name] RN, BSN, OCN Head & Neck Oncology Nurse [place] at [facility] Phone # [phone]  Fax # [phone]

## 2022-02-14 ENCOUNTER — Ambulatory Visit: Payer: Medicaid Other

## 2022-02-14 ENCOUNTER — Encounter: Payer: Medicaid Other | Admitting: Dietician

## 2022-02-14 ENCOUNTER — Ambulatory Visit
Admission: RE | Admit: 2022-02-14 | Discharge: 2022-02-14 | Disposition: A | Discharge status: Home / Self Care | Payer: Medicaid Other | Source: Ambulatory Visit | Attending: Radiation Oncology | Admitting: Radiation Oncology

## 2022-02-14 ENCOUNTER — Other Ambulatory Visit: Payer: Self-pay

## 2022-02-14 DIAGNOSIS — C760 Malignant neoplasm of head, face and neck: Secondary | ICD-10-CM | POA: Diagnosis not present

## 2022-02-14 LAB — RAD ONC ARIA SESSION SUMMARY
Course Elapsed Days: 1
Plan Fractions Treated to Date: 2
Plan Prescribed Dose Per Fraction: 2 Gy
Plan Total Fractions Prescribed: 35
Plan Total Prescribed Dose: 70 Gy
Reference Point Dosage Given to Date: 4 Gy
Reference Point Session Dosage Given: 2 Gy
Session Number: 2

## 2022-02-15 ENCOUNTER — Other Ambulatory Visit: Payer: Self-pay

## 2022-02-15 ENCOUNTER — Ambulatory Visit
Admission: RE | Admit: 2022-02-15 | Discharge: 2022-02-15 | Disposition: A | Discharge status: Home / Self Care | Payer: Medicaid Other | Source: Ambulatory Visit | Attending: Radiation Oncology | Admitting: Radiation Oncology

## 2022-02-15 DIAGNOSIS — C760 Malignant neoplasm of head, face and neck: Secondary | ICD-10-CM | POA: Diagnosis not present

## 2022-02-15 LAB — RAD ONC ARIA SESSION SUMMARY
Course Elapsed Days: 2
Plan Fractions Treated to Date: 3
Plan Prescribed Dose Per Fraction: 2 Gy
Plan Total Fractions Prescribed: 35
Plan Total Prescribed Dose: 70 Gy
Reference Point Dosage Given to Date: 6 Gy
Reference Point Session Dosage Given: 2 Gy
Session Number: 3
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
## 2022-02-16 ENCOUNTER — Inpatient Hospital Stay: Payer: Medicaid Other | Admitting: Nutrition

## 2022-02-16 ENCOUNTER — Encounter: Payer: Self-pay | Admitting: Physical Therapy

## 2022-02-16 ENCOUNTER — Ambulatory Visit: Payer: Medicaid Other | Attending: Radiation Oncology | Admitting: Physical Therapy

## 2022-02-16 ENCOUNTER — Ambulatory Visit
Admission: RE | Admit: 2022-02-16 | Discharge: 2022-02-16 | Disposition: A | Discharge status: Home / Self Care | Payer: Medicaid Other | Source: Ambulatory Visit | Attending: Radiation Oncology | Admitting: Radiation Oncology

## 2022-02-16 ENCOUNTER — Other Ambulatory Visit: Payer: Self-pay

## 2022-02-16 ENCOUNTER — Ambulatory Visit: Payer: Medicaid Other | Attending: Radiation Oncology

## 2022-02-16 DIAGNOSIS — C760 Malignant neoplasm of head, face and neck: Secondary | ICD-10-CM | POA: Diagnosis not present

## 2022-02-16 DIAGNOSIS — R293 Abnormal posture: Secondary | ICD-10-CM | POA: Diagnosis not present

## 2022-02-16 DIAGNOSIS — R1310 Dysphagia, unspecified: Secondary | ICD-10-CM | POA: Diagnosis not present

## 2022-02-16 LAB — RAD ONC ARIA SESSION SUMMARY
Course Elapsed Days: 3
Plan Fractions Treated to Date: 4
Plan Prescribed Dose Per Fraction: 2 Gy
Plan Total Fractions Prescribed: 35
Plan Total Prescribed Dose: 70 Gy
Reference Point Dosage Given to Date: 8 Gy
Reference Point Session Dosage Given: 2 Gy
Session Number: 4

## 2022-02-16 NOTE — Progress Notes (Signed)
Oncology Nurse Navigator Documentation   Met with Mr. Wixom, his daughter, and his mother before his meeting with SLP and PT today during head and neck clinic. He is tolerating treatment well at this time and knows to call me if they have any concerns or questions as his treatment progresses.   Jennifer Malmfelt RN, BSN, OCN Head & Neck Oncology Nurse Navigator [facility] Phone # [phone]  Fax # [phone]  

## 2022-02-16 NOTE — Progress Notes (Signed)
61 year old male diagnosed with left cervical lymph node cancer being followed by Dr. Isidore Moos and Dr. Chryl Heck.  Patient was diagnosed with esophageal cancer in 2018 and is status post pull-through esophagectomy.  PMH also includes dysphagia, GERD, and hypertension.  Medications include Lipitor, Protonix.  Labs were reviewed.  Height: 4 feet 11 inches. Weight: 123 pounds on July 5. Usual body weight: 125-128 pounds per patient. BMI: 24.83.  Patient reports sometimes he skips breakfast or he may eat something like grits with cheese.  He avoids spicy foods.  Reports tolerating sandwiches and snacks such as popsicles.  States it "depends on the day" as to what he can swallow.  Reports he will do anything he needs to do to avoid a feeding tube.  Nutrition diagnosis: Predicted sub optimal energy intake related to head and neck cancer as evidenced by a condition for which research shows sub optimal oral intake.  Intervention: Educated patient on importance of smaller more frequent meals and snacks consisting of soft, moist foods.  Encourage sips of water after bites of food to ease swallowing. Recommended 1 Ensure Plus high-protein daily.  Provided 1 complementary case of Ensure Plus high-protein. Encouraged weight maintenance.  Nutrition facts sheets provided.  Patient has RD contact information.  Monitoring, evaluation, goals: Patient will tolerate increased calories and protein to minimize weight loss throughout treatment.  Next visit: Tuesday, July 25 after radiation therapy with Vinnie Level.
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
## 2022-02-16 NOTE — Patient Instructions (Signed)
SWALLOWING EXERCISES Do these until 6 months after your last day of radiation, then 2 times per week afterwards  Effortful Swallows - Press your tongue against the roof of your mouth for 3 seconds, then squeeze the muscles in your neck while you swallow your saliva or a sip of water - Repeat 10-15 times, 2-3 times a day, and use whenever you eat or drink  [place] Swallow - swallow with your tongue sticking out - Stick tongue out past your lips and gently bite tongue with your teeth - Swallow, while holding your tongue with your teeth - Repeat 10-15 times, 2-3 times a day *use a wet spoon if your mouth gets dry*  Shaker Exercise - head lift - Lie flat on your back in your bed or on a couch without pillows - Raise your head and look at your feet - KEEP YOUR SHOULDERS DOWN - HOLD FOR 45-60 SECONDS, then lower your head back down - Repeat 3 times, 2-3 times a day  [employer] - "half swallow" exercise - Start to swallow, and keep your Adam's apple up by squeezing hard with the muscles of the throat - Hold the squeeze for 5-7 seconds and then relax - Repeat 10-15 times, 2-3 times a day *use a wet spoon if your mouth gets dry*       5.   "Super Swallow"  - Take a breath and hold it  - Bear down (like pushing your bowels)  - Swallow then IMMEDIATELY cough  - Repeat 10 times, 2-3 times a day

## 2022-02-17 ENCOUNTER — Ambulatory Visit
Admission: RE | Admit: 2022-02-17 | Discharge: 2022-02-17 | Disposition: A | Discharge status: Home / Self Care | Payer: Medicaid Other | Source: Ambulatory Visit | Attending: Radiation Oncology | Admitting: Radiation Oncology

## 2022-02-17 ENCOUNTER — Other Ambulatory Visit: Payer: Self-pay

## 2022-02-17 ENCOUNTER — Other Ambulatory Visit: Payer: Self-pay | Admitting: Family Medicine

## 2022-02-17 DIAGNOSIS — C760 Malignant neoplasm of head, face and neck: Secondary | ICD-10-CM | POA: Diagnosis not present

## 2022-02-17 LAB — RAD ONC ARIA SESSION SUMMARY
Course Elapsed Days: 4
Plan Fractions Treated to Date: 5
Plan Prescribed Dose Per Fraction: 2 Gy
Plan Total Fractions Prescribed: 35
Plan Total Prescribed Dose: 70 Gy
Reference Point Dosage Given to Date: 10 Gy
Reference Point Session Dosage Given: 2 Gy
Session Number: 5

## 2022-02-20 ENCOUNTER — Ambulatory Visit
Admission: RE | Admit: 2022-02-20 | Discharge: 2022-02-20 | Disposition: A | Discharge status: Home / Self Care | Payer: Medicaid Other | Source: Ambulatory Visit | Attending: Radiation Oncology | Admitting: Radiation Oncology

## 2022-02-20 ENCOUNTER — Other Ambulatory Visit: Payer: Self-pay

## 2022-02-20 ENCOUNTER — Telehealth: Payer: Self-pay | Admitting: Physician Assistant

## 2022-02-20 ENCOUNTER — Ambulatory Visit: Payer: Medicaid Other

## 2022-02-20 ENCOUNTER — Inpatient Hospital Stay: Payer: Medicaid Other | Admitting: Hematology and Oncology

## 2022-02-20 ENCOUNTER — Encounter: Payer: Self-pay | Admitting: Radiation Oncology

## 2022-02-20 VITALS — BP 119/74 | HR 70 | Temp 97.7°F | Resp 19 | Ht 59.0 in | Wt 122.2 lb

## 2022-02-20 DIAGNOSIS — C779 Secondary and unspecified malignant neoplasm of lymph node, unspecified: Secondary | ICD-10-CM

## 2022-02-20 DIAGNOSIS — C760 Malignant neoplasm of head, face and neck: Secondary | ICD-10-CM | POA: Diagnosis not present

## 2022-02-20 DIAGNOSIS — C01 Malignant neoplasm of base of tongue: Secondary | ICD-10-CM | POA: Insufficient documentation

## 2022-02-20 LAB — RAD ONC ARIA SESSION SUMMARY
Course Elapsed Days: 7
Plan Fractions Treated to Date: 6
Plan Prescribed Dose Per Fraction: 2 Gy
Plan Total Fractions Prescribed: 35
Plan Total Prescribed Dose: 70 Gy
Reference Point Dosage Given to Date: 12 Gy
Reference Point Session Dosage Given: 2 Gy
Session Number: 6

## 2022-02-20 NOTE — Telephone Encounter (Signed)
Patient called today to state his cancer has returned and he will be starting radiation therapy.  He has an appointment with Estill Bamberg on 8/16 to discuss "stretching his esophagus."  He needs to have his care coordinated with all his doctors to make sure one thing isn't interfering with something else.  He didn't know if Estill Bamberg or Dr. [name] would be able to call Dr. Lanell Persons and discuss patient's treatment.  Please call patient and advise.  Thank you.

## 2022-02-20 NOTE — Progress Notes (Signed)
[place] OFFICE PROGRESS NOTE  [name], MD [address] [place] [address]  DIAGNOSIS: Follow up left neck node biopsy result   Oncology History Overview Note  Cancer Staging Esophageal cancer [facility]) Staging form: Esophagus - Adenocarcinoma, AJCC 8th Edition - Clinical stage from 10/26/2016: Stage III (cT3, cN1, cM0) - Signed by [name], MD on 11/05/2016     Malignant neoplasm of lower third of esophagus ([place])  09/21/2016 - 09/21/2016 Hospital Admission   esophageal pain and vomiting up blood   10/09/2016 Initial Diagnosis   Esophageal cancer ([place])   10/09/2016 Procedure   EGD 1. Partially obstructing, likely malignant esophageal tumor was found in the lower third of the esophagus. Multiple biopsies.  2. Mass visible during gastric retroflexion at GE junction 3. Otherwise normal stomach 4. Normal examined duodenum    10/09/2016 Pathology Results   Esophagus, biopsy, distal esophageal tumor ([id number]) - SUSPICIOUS FOR ADENOCARCINOMA   10/12/2016 Imaging   CT CAP w Contrast IMPRESSION: Distal esophageal mass compatible with primary esophageal malignancy. There are 2 adjacent abnormal appearing subcentimeter paraesophageal lymph nodes which may represent nodal metastasis. Additionally there is a prominent nonspecific 8 mm upper abdominal lymph node.   No evidence for distant metastatic disease in the chest, abdomen or pelvis.   10/24/2016 PET scan   1. Markedly hypermetabolic distal esophageal lesion, compatible with malignancy. Adjacent small paraesophageal lymph nodes are abnormal by CT but cannot be resolved as separate structures from the hypermetabolic esophageal activity on the PET images. No hypermetabolism is demonstrated in the upper abdominal/gastrohepatic ligament lymph node although the small size of this lymph node may be below threshold for detection on PET imaging. 2. No evidence for distant hypermetabolic metastatic disease  in the neck, chest, abdomen, or pelvis.   10/26/2016 Pathology Results   Esophagogastric junction, biopsy, mass - INVASIVE ADENOCARCINOMA.   10/26/2016 Procedure   EUS showed uT3N1 disease, and biopsy confirmed adenocarcinoma    10/30/2016 - 12/07/2016 Radiation Therapy   Neoadjuvant radiation to esophageal cancer  Under the care of Dr. [name]   10/31/2016 - 12/07/2016 Neo-Adjuvant Chemotherapy   Weekly Carboplatin AUC 2 and taxol '50mg'$ /m2 with concurrent radiation     12/14/2016 Imaging   CT AP W Contrast 12/14/16 IMPRESSION: 1. Mildly dilated short segment of proximal jejunum without bowel wall thickening or significant inflammatory changes. Findings may represent focal ileus or radiation enteritis. No evidence for obstruction. 2. Significantly improved appearance of the distal esophagus/proximal stomach with decreased wall thickening (1.1 cm from 2.3 cm), and smaller paraesophageal and gastrohepatic ligament lymph nodes. 3. No bowel obstruction.  Normal appendix.   01/18/2017 Imaging   CT CAP W Contrast 01/18/17 IMPRESSION: 1. Mild stable distal esophageal wall thickening likely due to radiation change. No findings for recurrent tumor. Small paraesophageal lymph nodes are also stable. 2. No findings for metastatic disease. 3. Stable mild/early emphysematous changes and age advanced atherosclerotic calcifications involving the thoracic and abdominal aorta and branch vessels.   04/12/2017 Imaging   CT Chest and Abdomen W Contrast 04/12/17  IMPRESSION: 1. Mild stable distal esophageal wall thickening. No findings for recurrent tumor. 2. Stable small mediastinal and left supraclavicular lymph nodes. 3. There is a tiny nodule within the medial right lower lobe measuring 3 mm. New from previous exam. Nonspecific. Attention on follow-up imaging advise. 4.  Aortic Atherosclerosis (ICD10-I70.0). 5. Three vessel coronary artery calcification   04/16/2017 Surgery   VIDEO BRONCHOSCOPY,  Transhiatal Total Esophagectomy, Esophagogastrostomy,  pyloromotomy, Feeding Jejunostomy ESOPHAGECTOMY COMPLETE,Transhiatal total esophagectomy JEJUNOSTOMY,Feeding by Dr. [name] 04/16/17   04/16/2017 Pathology Results   Diagnosis 04/16/17  1. Omentum, resection for tumor - BENIGN ADIPOSE TISSUE CONSISTENT WITH OMENTUM. - NO EVIDENCE OF MALIGNANCY. 2. Esophagus, resection, w/ GE junction - FIBROSIS WITH PATCHY CHRONIC INFLAMMATION. - NO RESIDUAL CARCINOMA IDENTIFIED. - MARGINS NOT INVOLVED. - TWELVE LYMPH NODES WITH NO METASTATIC CARCINOMA (0/12).    07/16/2017 Procedure   Upper GI Endoscopy Findings: per Dr. [name] - Food was found in the upper third of the esophagus. Removal of food was accomplished with Jabier Mutton net. - A partial esophagectomy anastomosis was found in the upper third of the esophagus (20 cm from incisors). This was characterized by severe stenosis, an intact staple line and visible sutures. The standard adult upper endoscope would not pass before dilation. A TTS dilator was passed through the scope. Dilation with an 8.5-9.5-10.5 mm and then 06-11-12 mm balloon dilator was performed to 12 mm (inspection after 9.5 mm, 10.5 mm, 11 mm and 12 mm). The dilation site was examined and showed moderate and significant improvement in luminal narrowing. Estimated blood loss was minimal. After dilation to 12 mm the endoscope was able to transverse the anastomosis with minimal pressure. - A medium amount of food (residue) was found in the gastric body. - The exam of the stomach was otherwise normal. - The examined duodenum was normal.   08/13/2017 Procedure   Upper GI Endoscopy Findings: per Dr. [name] - Food was found in the proximal esophagus. Removal of food was accomplished with Jabier Mutton net. - One severe (stenosis; an endoscope cannot pass) benign-appearing, intrinsic stenosis was found 20 cm from the incisors. This measured 1 cm (in length) and was traversed after dilation. A TTS  dilator was passed through the scope. Dilation with an 06-11-12 mm balloon dilator was performed to 13 mm (after 8m and 12 mm). The dilation site was examined and showed moderate improvement in luminal narrowing. At the stricture there is a visible staple on the proximal side and visible suture material on the gastric side. - A medium amount of food (residue) -the duodenum was normal    08/31/2017 Procedure   Endoscopic needle-knife excision of anastomotic stricture [facility]  09/11/2017 Imaging   CT CAP W CONTRAST IMPRESSION: 1. Interval esophagectomy with gastric pull-through. No demonstrated complication. There is retained ingested material within the intrathoracic portion of the stomach. 2. No evidence of local recurrence or metastatic disease. 3. Stable small left supraclavicular and superior mediastinal lymph nodes. 4. Mild lower lobe paramediastinal pulmonary opacity bilaterally, likely atelectasis or sequela of prior radiation therapy. 5.  Aortic Atherosclerosis (ICD10-I70.0).     02/28/2018 Procedure   02/28/2018 Upper endoscopy Impression - Benign-appearing esophageal stenosis. Dilated. - A previous surgical anastomosis was found in the proximal stomach. - Normal examined duodenum. - No specimens collected.   04/10/2018 Imaging   04/10/2018 CT CA IMPRESSION: 1. No evidence of metastatic disease. 2. Aortic atherosclerosis (ICD10-170.0). Coronary artery calcification. 3.  Emphysema (ICD10-J43.9).   03/20/2019 Imaging   CT CAP W contrast 03/20/19  IMPRESSION: 1. Stable exam. No new or progressive interval findings to suggest recurrent/metastatic disease. 2.  Aortic Atherosclerois (ICD10-170.0)   03/30/2019 Procedure   EGD with Dr SFuller Plan8/30/20  IMPRESSION - Food impaction in the proximal esophagus. Removal was successful. 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 - Ectopic gastric mucosa in the proximal esophagus. - Prior esophagectomy, gastric pull up.  Sutures noted in  gastric body. - A medium amount of food (residue) in the stomach that obscured visualization. - Normal duodenal bulb and second portion of the duodenum.   05/26/2019 Procedure   Upper Endoscopy by Dr. [name] 05/26/19  IMPRESSION - Benign-appearing esophageal stenosis at anastomosis. Dilated to 19 mm with balloon. Injected with steroid. 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 - Normal examined duodenum. - No specimens collected.   03/09/2020 Imaging   CT CAP w contrast  IMPRESSION: Stable exam. No evidence of recurrent or metastatic carcinoma within the chest, abdomen, or pelvis.   Aortic Atherosclerosis (ICD10-I70.0). Coronary artery atherosclerosis.   06/03/2021 Procedure   Dr. [name]  IMPRESSION: An esophagogastric anastomosis was found without esophagitis or recurrent malignancy. Dilated to 20 mm with balloon. 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 - Normal examined duodenum. - No specimens collected.   Head and neck cancer ([place])  12/09/2021 Imaging   CT Soft Tissue Neck W Contrast  IMPRESSION: 1. Large, heterogeneous left level 2A lymph node, consistent with metastatic disease. 2. Hyperenhancing appearance of the salivary glands may be a sequela of radiation treatment. 3. Please see the report of the CT of the chest, abdomen and pelvis performed the same day for evaluation of findings below the thoracic inlet.   12/09/2021 Imaging   CT Chest W Contrast, Abdomen, and pelvis   IMPRESSION: 1. Status post pull-through esophagectomy. 2. No evidence of metastatic disease within the chest, abdomen, or pelvis. Specifically, no additional lymphadenopathy identified. 3. Mild, diffuse bilateral bronchial wall thickening and background of fine centrilobular pulmonary nodularity, most commonly seen in smoking-related respiratory bronchiolitis. 4. Coronary artery disease.      12/19/2021 Procedure   [place] CORE BIOPSY     12/19/2021  Pathology Results   CASE: [id number]  A. 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     12/22/2021 Initial Diagnosis   Head and neck cancer ([place])   Malignant neoplasm of base of tongue ([place])  02/20/2022 Initial Diagnosis   Malignant neoplasm of base of tongue ([place])   02/20/2022 Cancer Staging   Staging form: Pharynx - HPV-Mediated Oropharynx, AJCC 8th Edition - Clinical stage from 02/20/2022: Stage I (cT1, cN1, cM0, p16+) - Signed by Eppie [place], MD on 02/20/2022 Stage prefix: Initial diagnosis     CURRENT THERAPY: Pending workup for newly diagnosed head/neck squamous cell carcinoma   Oncologic History  To review his history, the patient was diagnosed with stage III esophageal cancer in 2018, underwent neoadjuvant chemoradiation followed by surgery with no residual disease now undergoes surveillance endoscopies, return with the palpable lymphadenopathy in left cervical region.   The area was not initially erythematous or sore in general to palpation.  He denies any other palpable adenopathy. He denies any oral lesions. He is a former smoker having quit in 2014. Denies ever chewing tobacco. He has dentures.   A restaging CT scan was performed. His CAP did not show any concern for disease progression from his esophageal cancer, however, the left cervical node is concerning for metastatic  nodal disease. Therefore, this was biopsied on 12/19/21.   Pathology from the lymph node biopsy showed a  keratinizing moderately differentiated squamous cell carcinoma p16 positive He had PET/CT in June 2023 which did not show any evidence of primary, hypermetabolic mass or lymphadenopathy in the left upper neck levels 2A and 2B, no other sites of malignancy identified.   Interval history  Patient is here for follow-up.  Since his last visit he started radiation about a week ago.  He has been tolerating it well except for some pain in his throat.  He was last discussed in the ENT tumor board and was deemed to be T1 N1 M0 or stage I. Rest of the pertinent 10 point ROS reviewed and negative  MEDICAL HISTORY: Past Medical History:  Diagnosis Date   Allergy    Arthritis    Asthma    as a child   Cancer ([place]) 10/09/2016   ESOPHAGUS CARCINOMA    COPD (chronic obstructive pulmonary disease) (HCC)    CP (cerebral palsy), spastic (HCC)    right   Dysphagia    Dyspnea    with exersion   Emphysema of lung ([place])    Encounter for nonprocreative genetic counseling 10/31/2016   Mr. Roadcap underwent genetic counseling for hereditary cancer syndromes on 10/31/2016. Though he is a candidate for genetic testing, he declines at this time.   GERD (gastroesophageal reflux disease)    History of chemotherapy    03-2017   History of radiation therapy    03-2017   Hypertension    Neuromuscular disorder (HCC)    [name]   Pneumonia 4 yrs ago    ALLERGIES:  is allergic to penicillins.  MEDICATIONS:  Current Outpatient Medications  Medication Sig Dispense Refill   acetaminophen (TYLENOL) 500 MG tablet Take 500 mg by mouth every 6 (six) hours as needed for moderate pain.     albuterol (VENTOLIN HFA) 108 (90 Base) MCG/ACT inhaler Inhale 2 puffs into the lungs every 6 (six) hours as needed for wheezing or shortness of breath. 8 g 3   atorvastatin (LIPITOR) 20 MG tablet TAKE 1 TABLET BY MOUTH EVERY DAY 90 tablet 3   benzonatate (TESSALON) 100 MG capsule Take 1 capsule (100 mg total) by mouth 3 (three) times  daily as needed for cough. (Patient not taking: Reported on 01/19/2022) 30 capsule 2   Budeson-Glycopyrrol-Formoterol (BREZTRI AEROSPHERE) 160-9-4.8 MCG/ACT AERO Inhale 2 puffs into the lungs 2 (two) times daily. 10.7 g 11   cyclobenzaprine (FLEXERIL) 5 MG tablet Take 1 tablet (5 mg total) by mouth 3 (three) times daily as needed for muscle spasms. 30 tablet 0   fluticasone (FLONASE) 50 MCG/ACT nasal spray SPRAY 1 SPRAY INTO EACH NOSTRIL EVERY DAY AS NEEDED FOR ALLERGIES/RHINITIS 16 mL 2   levETIRAcetam (KEPPRA) 500 MG tablet TAKE 1 TABLET BY MOUTH TWICE A DAY 60 tablet 1   lidocaine (XYLOCAINE) 2 % solution Patient: Mix 1part 2% viscous lidocaine, 1part H20. Swish & swallow 76m of diluted mixture, 360m before meals and at bedtime, up to QID 200 mL 3   pantoprazole (PROTONIX) 40 MG tablet TAKE 1 TABLET BY MOUTH TWICE A DAY 180 tablet 0   rOPINIRole (REQUIP) 1 MG tablet Take 1 tablet (1 mg total) by mouth at bedtime. 30 tablet 5   sulfamethoxazole-trimethoprim (BACTRIM DS) 800-160 MG tablet Take 1 tablet by mouth 2 (two) times daily. 14 tablet 0   No current facility-administered medications for this visit.    SURGICAL HISTORY:  Past Surgical History:  Procedure Laterality Date   COLONOSCOPY     COMPLETE ESOPHAGECTOMY N/A 04/16/2017   Procedure: ESOPHAGECTOMY COMPLETE,Transhiatal total esophagectomy;  Surgeon: [name], MD;  Location: [facility] OR;  Service: Thoracic;  Laterality: N/A;   DIRECT LARYNGOSCOPY N/A 01/27/2022   Procedure: DIRECT LARYNGOSCOPY WITH BIOPSIES; FROZEN SECTION;  Surgeon: [name], MD;  Location: [facility];  Service: ENT;  Laterality: N/A;   ESOPHAGOGASTRODUODENOSCOPY (EGD) WITH PROPOFOL N/A 03/30/2019   Procedure: ESOPHAGOGASTRODUODENOSCOPY (EGD) WITH PROPOFOL;  Surgeon: Ladene Artist, MD;  Location: [facility] ENDOSCOPY;  Service: Endoscopy;  Laterality: N/A;   EUS N/A 10/26/2016   Procedure: UPPER ENDOSCOPIC ULTRASOUND (EUS) LINEAR;  Surgeon: [name], MD;   Location: [facility] ENDOSCOPY;  Service: Endoscopy;  Laterality: N/A;   EYE SURGERY Bilateral age 73    for cross eyes   FOREIGN BODY REMOVAL  03/30/2019   Procedure: FOREIGN BODY REMOVAL;  Surgeon: Ladene Artist, MD;  Location: [facility];  Service: Endoscopy;;   IR FLUORO GUIDE PORT INSERTION RIGHT  11/08/2016   right upper chest   IR REMOVAL TUN ACCESS W/ PORT W/O FL MOD SED  04/12/2020   IR US GUIDE VASC ACCESS RIGHT  11/08/2016   JEJUNOSTOMY N/A 04/16/2017   Procedure: Donney Rankins;  Surgeon: [name], MD;  Location: [facility];  Service: Thoracic;  Laterality: N/A;   JEJUNOSTOMY     removal of feeding tube /pt unsure of date   MOUTH SURGERY     POLYPECTOMY     TONSILLECTOMY Bilateral 01/27/2022   Procedure: POSSIBLE TONSILLECTOMY;  Surgeon: [name], MD;  Location: [facility];  Service: ENT;  Laterality: Bilateral;   UPPER GASTROINTESTINAL ENDOSCOPY     UPPER GASTROINTESTINAL ENDOSCOPY  06/03/2021   hx of esoph ca   VIDEO BRONCHOSCOPY N/A 04/16/2017   Procedure: VIDEO BRONCHOSCOPY, Transhiatal Total Esophagectomy, Esophagogastrostomy, pyloromotomy, Feeding Jejunostomy;  Surgeon: [name], MD;  Location: [facility] OR;  Service: Thoracic;  Laterality: N/A;    REVIEW OF SYSTEMS:   Constitutional: Negative for appetite change, chills, fatigue, fever and unexpected weight change.  HENT: Positive for occasional dysphagia since surgery. Negative for mouth sores, nosebleeds, sore throat . Eyes: Negative for eye problems and icterus.  Respiratory: Positive for stable dyspnea on exertion and slightly worsening chronic cough.  Negative for hemoptysis and wheezing.   Cardiovascular: Negative for chest pain and leg swelling.  Gastrointestinal: Positive for mild intermittent LUQ pain with certain movements. Positive for occasional dysphagia/"regurgitation". Negative for constipation, diarrhea, nausea and vomiting.  Genitourinary: Negative for bladder incontinence, difficulty  urinating, dysuria, frequency and hematuria.   Musculoskeletal: Negative for back pain, gait problem, neck pain and neck stiffness.  Skin: Negative for itching and rash.  Neurological: Negative for dizziness, extremity weakness, gait problem, headaches, light-headedness and seizures.  Hematological: Positive for left cervical lymph node enlargement. Does not bruise/bleed easily.  Psychiatric/Behavioral: Negative for confusion, depression and sleep disturbance. The patient is not nervous/anxious.    PHYSICAL EXAMINATION:  Blood pressure 119/74, pulse 70, temperature 97.7 F (36.5 C), temperature source Oral, resp. rate 19, height '4\' 11"'$  (1.499 m), weight 122 lb 3.2 oz (55.4 kg), SpO2 99 %.  ECOG PERFORMANCE STATUS: 1  Physical Exam   Constitutional: Oriented to person, place, and time and well-developed, well-nourished, and in no distress.  Psychiatric: Mood, memory and judgment normal.   LABORATORY DATA: Lab Results  Component Value Date   WBC 7.3 01/27/2022   HGB 13.1 01/27/2022   HCT 39.7  01/27/2022   MCV 94.5 01/27/2022   PLT 233 01/27/2022      Chemistry      Component Value Date/Time   NA 138 01/27/2022 0906   NA 137 06/14/2017 0840   K 4.0 01/27/2022 0906   K 3.9 06/14/2017 0840   CL 108 01/27/2022 0906   CO2 22 01/27/2022 0906   CO2 22 06/14/2017 0840   BUN 13 01/27/2022 0906   BUN 10.2 06/14/2017 0840   CREATININE 0.88 01/27/2022 0906   CREATININE 0.83 01/06/2022 1314   CREATININE 0.7 06/14/2017 0840      Component Value Date/Time   CALCIUM 9.0 01/27/2022 0906   CALCIUM 9.5 06/14/2017 0840   ALKPHOS 83 11/22/2021 1456   ALKPHOS 132 06/14/2017 0840   AST 17 11/22/2021 1456   AST 22 06/14/2017 0840   ALT 13 11/22/2021 1456   ALT 26 06/14/2017 0840   BILITOT 0.5 11/22/2021 1456   BILITOT 0.38 06/14/2017 0840       RADIOGRAPHIC STUDIES:  No results found.   ASSESSMENT/PLAN:  Darrell Cunningham is a 61 y.o. male with    1. Low esophageal  Adenocarcinoma, cT3N1M0, stage III, ypT0N0   -He was diagnosed in 09/2016. He is s/p neoadjuvant ChemoRT and esophagectomy.  -He achieved complete pathological response to neoadjuvant therapy  -His risk of recurrence is low. Dr. Burr Medico recommended cancer surveillance for 5 years. -He has had multiple EGD for dilatation. His last EGD with Dr [name] in 05/2021 was benign. Last CT CAP in 02/2020 was NED.  --Most recent PET scan from December 29, 2021 without any evidence of esophageal carcinoma. -Presently, no evidence of disease recurrence from his prior esophageal adenocarcinoma.   2. 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  -The patient endorsed a new left cervical lymph node enlarged in March 2023 -CT of the neck noted large heterogeneous left level 2A lymph node consistent with metastatic disease.  There is also hyperenhancing appearance of the salivary glands which was reported to be sequela of radiation treatment most likely -The patient underwent a ultrasound-guided biopsy of this lymph node and the pathology was consistent with keratinizing squamous cell carcinoma. This was also noted to be p16 positive.  PET scan with no evidence of primary, no metastatic disease.   Targeted biopsy showed squamous cell carcinoma from the base of the tongue.  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  He started radiation last week, definitive RT for 7 weeks.  He is here for follow-up to see if he will need any additional chemotherapy.  Since his lymph node is over 3 cm on imaging, we have discussed about considering chemotherapy concurrent with radiation however patient is really reluctant especially with his past medical history of chemoradiation for esophageal cancer.  He would like to think about this.  He understands adverse effects of chemotherapy.  He will let us know by tomorrow if he wishes to proceed with  chemo along with radiation or radiation therapy alone.  2. Malnutrition and weight loss, Esophageal stricture  --He has had multiple esophageal stretch. He had esophageal obstruction due to food impaction in 03/2019.  -Last EGD in 05/2021. There was no evidence of diseases recurrence.  -His weight today is about 122 lbs,  -He should continue follow-up with nutrition   3. History of heavy smoking -He has quit smoking completely in 2014, history of 40  pack year since age 55.  -PCP manages his COPD. Compliant with inhalers.     Plan:As mentioned above   No orders of the defined types were placed in this encounter.   Shep Porter, PA-C 02/20/22  I spent 40 minutes in the care of this patient including History, review of records, counseling, documentation and coordination of care. Discussed plan of care with Dr Isidore Moos.  Patient agreed to concurrent chemotherapy, treatment plan added, message sent to scheduling.

## 2022-02-20 NOTE — Telephone Encounter (Signed)
Requested Prescriptions  Pending Prescriptions Disp Refills  . levETIRAcetam (KEPPRA) 500 MG tablet [Pharmacy Med Name: LEVETIRACETAM 500 MG TABLET] 60 tablet 1    Sig: TAKE 1 TABLET BY MOUTH TWICE A DAY     Neurology:  Anticonvulsants - levetiracetam Failed - 02/17/2022  8:48 PM      Failed - Completed PHQ-2 or PHQ-9 in the last 360 days      Passed - Cr in normal range and within 360 days    Creatinine  Date Value Ref Range Status  01/06/2022 0.83 0.61 - 1.24 mg/dL Final  06/14/2017 0.7 0.7 - 1.3 mg/dL Final   Creatinine, Ser  Date Value Ref Range Status  01/27/2022 0.88 0.61 - 1.24 mg/dL Final         Passed - Valid encounter within last 12 months    Recent Outpatient Visits          7 months ago New onset seizure ([place])   [place] Pickard, [name], MD   1 year ago Prostate cancer screening   [place] [name], MD   1 year ago COPD exacerbation [facility])   [place] Eulogio Bear, NP   1 year ago Malignant neoplasm of lower third of esophagus ([place])   [place] [name], MD   4 years ago Hypotension due to drugs   [place] Pickard, [name], MD

## 2022-02-21 ENCOUNTER — Ambulatory Visit
Admission: RE | Admit: 2022-02-21 | Discharge: 2022-02-21 | Disposition: A | Discharge status: Home / Self Care | Payer: Medicaid Other | Source: Ambulatory Visit | Attending: Radiation Oncology | Admitting: Radiation Oncology

## 2022-02-21 ENCOUNTER — Inpatient Hospital Stay: Payer: Medicaid Other | Admitting: Dietician

## 2022-02-21 ENCOUNTER — Other Ambulatory Visit: Payer: Self-pay

## 2022-02-21 ENCOUNTER — Encounter: Payer: Self-pay | Admitting: Hematology and Oncology

## 2022-02-21 ENCOUNTER — Other Ambulatory Visit: Payer: Self-pay | Admitting: Hematology and Oncology

## 2022-02-21 DIAGNOSIS — C760 Malignant neoplasm of head, face and neck: Secondary | ICD-10-CM | POA: Diagnosis not present

## 2022-02-21 LAB — RAD ONC ARIA SESSION SUMMARY
Course Elapsed Days: 8
Plan Fractions Treated to Date: 7
Plan Prescribed Dose Per Fraction: 2 Gy
Plan Total Fractions Prescribed: 35
Plan Total Prescribed Dose: 70 Gy
Reference Point Dosage Given to Date: 14 Gy
Reference Point Session Dosage Given: 2 Gy
Session Number: 7

## 2022-02-21 NOTE — Progress Notes (Signed)
Patient wants to proceed with chemo concurrent with radiation. Treatment plan placed  Darrell Cunningham

## 2022-02-21 NOTE — Progress Notes (Signed)
START ON PATHWAY REGIMEN - Head and Neck     A cycle is every 7 days:     Cisplatin   **Always confirm dose/schedule in your pharmacy ordering system**  Patient Characteristics: Oropharynx, HPV Positive, Preoperative or Nonsurgical Candidate (Clinical Staging), cT0-4, cN1-3 or cT3-4, cN0 Disease Classification: Oropharynx HPV Status: Positive (+) Therapeutic Status: Preoperative or Nonsurgical Candidate (Clinical Staging) AJCC T Category: cT1 AJCC 8 Stage Grouping: I AJCC N Category: cN1 AJCC M Category: cM0 Intent of Therapy: Curative Intent, Discussed with Patient

## 2022-02-21 NOTE — Progress Notes (Signed)
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 He does not have feeding tube.   Met with patient and family in office. Patient reports mild neck pain. He has not tried taking tylenol. Patient expresses concern about taking prescription pain relief due to daily seizure medication. He will discuss this with provider. Patient reports episode of vomiting after getting out of bed yesterday morning. He endorses thick saliva. Patient unsure if emesis related to saliva or dinner the night before. He is unable to recall what he ate. Patient is drinking Ensure a couple times week. Patient and family feeling overwhelmed today. He says Dr. Chryl Heck discussed option for concurrent chemoradiation therapy at visit yesterday. He is unsure of what to do. Nurse navigator [facility]) contacted via secure chat. RN available to join appointment to address initial questions.   Medications: reviewed   Labs: no new labs   Anthropometrics: Last weight (aria) 122.2 lb on 7/24  7/5 - 123 lb    NUTRITION DIAGNOSIS: Predicted suboptimal intake    INTERVENTION:  Reinforced education on smaller more frequent meals with soft moist foods Discussed ways to add calories and protein to foods (fortified milk, adding cheese, adding ONS to ice cream for high calorie/high protein shake) - shake recipes provided  Drink one Ensure Plus/equivalent daily Recommend baking soda salt water rinses several times daily - pt has recipe Monitor for possible updated treatment plan pending further discussions with pt and providers. Discussed rationale for feeding tube (J-tube) with concurrent treatment Provided support and active listening  Contact information provided    MONITORING, EVALUATION, GOAL: weight trends, intake, treatment plan   NEXT VISIT: Tuesday August 1 after radiation

## 2022-02-22 ENCOUNTER — Other Ambulatory Visit: Payer: Self-pay | Admitting: *Deleted

## 2022-02-22 ENCOUNTER — Ambulatory Visit (HOSPITAL_COMMUNITY): Payer: Medicaid Other

## 2022-02-22 ENCOUNTER — Telehealth: Payer: Self-pay

## 2022-02-22 ENCOUNTER — Other Ambulatory Visit: Payer: Self-pay

## 2022-02-22 ENCOUNTER — Ambulatory Visit
Admission: RE | Admit: 2022-02-22 | Discharge: 2022-02-22 | Disposition: A | Discharge status: Home / Self Care | Payer: Medicaid Other | Source: Ambulatory Visit | Attending: Radiation Oncology | Admitting: Radiation Oncology

## 2022-02-22 DIAGNOSIS — C01 Malignant neoplasm of base of tongue: Secondary | ICD-10-CM

## 2022-02-22 DIAGNOSIS — C760 Malignant neoplasm of head, face and neck: Secondary | ICD-10-CM | POA: Diagnosis not present

## 2022-02-22 LAB — RAD ONC ARIA SESSION SUMMARY
Course Elapsed Days: 9
Plan Fractions Treated to Date: 8
Plan Prescribed Dose Per Fraction: 2 Gy
Plan Total Fractions Prescribed: 35
Plan Total Prescribed Dose: 70 Gy
Reference Point Dosage Given to Date: 16 Gy
Reference Point Session Dosage Given: 2 Gy
Session Number: 8

## 2022-02-22 NOTE — Telephone Encounter (Signed)
Called pt and LVM for call back; offering chemo education appt for this Friday, 7/28 at either 9, 11 or 2.

## 2022-02-23 ENCOUNTER — Other Ambulatory Visit: Payer: Self-pay

## 2022-02-23 ENCOUNTER — Other Ambulatory Visit: Payer: Self-pay | Admitting: Radiology

## 2022-02-23 ENCOUNTER — Ambulatory Visit
Admission: RE | Admit: 2022-02-23 | Discharge: 2022-02-23 | Disposition: A | Discharge status: Home / Self Care | Payer: Medicaid Other | Source: Ambulatory Visit | Attending: Radiation Oncology | Admitting: Radiation Oncology

## 2022-02-23 DIAGNOSIS — C760 Malignant neoplasm of head, face and neck: Secondary | ICD-10-CM | POA: Diagnosis not present

## 2022-02-23 LAB — RAD ONC ARIA SESSION SUMMARY
Course Elapsed Days: 10
Plan Fractions Treated to Date: 9
Plan Prescribed Dose Per Fraction: 2 Gy
Plan Total Fractions Prescribed: 35
Plan Total Prescribed Dose: 70 Gy
Reference Point Dosage Given to Date: 18 Gy
Reference Point Session Dosage Given: 2 Gy
Session Number: 9

## 2022-02-24 ENCOUNTER — Ambulatory Visit
Admission: RE | Admit: 2022-02-24 | Discharge: 2022-02-24 | Disposition: A | Discharge status: Home / Self Care | Payer: Medicaid Other | Source: Ambulatory Visit | Attending: Radiation Oncology | Admitting: Radiation Oncology

## 2022-02-24 ENCOUNTER — Ambulatory Visit (HOSPITAL_COMMUNITY)
Admission: RE | Admit: 2022-02-24 | Discharge: 2022-02-24 | Disposition: A | Discharge status: Home / Self Care | Payer: Medicaid Other | Source: Ambulatory Visit | Attending: Hematology and Oncology | Admitting: Hematology and Oncology

## 2022-02-24 ENCOUNTER — Telehealth: Payer: Self-pay | Admitting: *Deleted

## 2022-02-24 ENCOUNTER — Other Ambulatory Visit: Payer: Self-pay | Admitting: *Deleted

## 2022-02-24 ENCOUNTER — Encounter (HOSPITAL_COMMUNITY): Payer: Self-pay

## 2022-02-24 ENCOUNTER — Inpatient Hospital Stay: Payer: Medicaid Other

## 2022-02-24 ENCOUNTER — Other Ambulatory Visit: Payer: Self-pay

## 2022-02-24 ENCOUNTER — Other Ambulatory Visit: Payer: Self-pay | Admitting: Hematology and Oncology

## 2022-02-24 DIAGNOSIS — C01 Malignant neoplasm of base of tongue: Secondary | ICD-10-CM

## 2022-02-24 DIAGNOSIS — C760 Malignant neoplasm of head, face and neck: Secondary | ICD-10-CM | POA: Diagnosis not present

## 2022-02-24 DIAGNOSIS — Z452 Encounter for adjustment and management of vascular access device: Secondary | ICD-10-CM | POA: Diagnosis not present

## 2022-02-24 DIAGNOSIS — C155 Malignant neoplasm of lower third of esophagus: Secondary | ICD-10-CM

## 2022-02-24 HISTORY — PX: IR IMAGING GUIDED PORT INSERTION: IMG5740

## 2022-02-24 LAB — RAD ONC ARIA SESSION SUMMARY
Course Elapsed Days: 11
Plan Fractions Treated to Date: 10
Plan Prescribed Dose Per Fraction: 2 Gy
Plan Total Fractions Prescribed: 35
Plan Total Prescribed Dose: 70 Gy
Reference Point Dosage Given to Date: 20 Gy
Reference Point Session Dosage Given: 2 Gy
Session Number: 10

## 2022-02-24 MED ORDER — DEXAMETHASONE 4 MG PO TABS: 8.0000 mg | ORAL_TABLET | Freq: Every day | ORAL | 1 refills | Status: DC

## 2022-02-24 MED ORDER — LIDOCAINE-PRILOCAINE 2.5-2.5 % EX CREA: TOPICAL_CREAM | CUTANEOUS | 3 refills | Status: DC

## 2022-02-24 MED ORDER — ONDANSETRON HCL 8 MG PO TABS: 8.0000 mg | ORAL_TABLET | Freq: Two times a day (BID) | ORAL | 1 refills | Status: DC | PRN

## 2022-02-24 MED ORDER — FENTANYL CITRATE (PF) 100 MCG/2ML IJ SOLN
INTRAMUSCULAR | Status: AC
  Filled 2022-02-24: qty 2

## 2022-02-24 MED ORDER — MIDAZOLAM HCL 2 MG/2ML IJ SOLN
INTRAMUSCULAR | Status: AC | PRN
  Administered 2022-02-24: 1 mg via INTRAVENOUS

## 2022-02-24 MED ORDER — FENTANYL CITRATE (PF) 100 MCG/2ML IJ SOLN
INTRAMUSCULAR | Status: AC | PRN
  Administered 2022-02-24: 50 ug via INTRAVENOUS

## 2022-02-24 MED ORDER — HEPARIN SOD (PORK) LOCK FLUSH 100 UNIT/ML IV SOLN
INTRAVENOUS | Status: AC | PRN
  Administered 2022-02-24: 500 [IU] via INTRAVENOUS

## 2022-02-24 MED ORDER — LIDOCAINE HCL 1 % IJ SOLN
INTRAMUSCULAR | Status: AC
  Filled 2022-02-24: qty 20

## 2022-02-24 MED ORDER — PROCHLORPERAZINE MALEATE 10 MG PO TABS: 10.0000 mg | ORAL_TABLET | Freq: Four times a day (QID) | ORAL | 1 refills | Status: DC | PRN

## 2022-02-24 MED ORDER — MIDAZOLAM HCL 2 MG/2ML IJ SOLN
INTRAMUSCULAR | Status: AC
  Filled 2022-02-24: qty 2

## 2022-02-24 MED ORDER — LIDOCAINE HCL 1 % IJ SOLN
INTRAMUSCULAR | Status: AC | PRN
  Administered 2022-02-24: 10 mL

## 2022-02-24 MED ORDER — LIDOCAINE HCL 1 % IJ SOLN
INTRAMUSCULAR | Status: AC | PRN
  Administered 2022-02-24: 10 mL via INTRADERMAL

## 2022-02-24 MED ORDER — HEPARIN SOD (PORK) LOCK FLUSH 100 UNIT/ML IV SOLN
INTRAVENOUS | Status: AC
  Filled 2022-02-24: qty 5

## 2022-02-24 MED ORDER — SODIUM CHLORIDE 0.9 % IV SOLN: INTRAVENOUS | Status: DC

## 2022-02-24 MED ORDER — IPRATROPIUM-ALBUTEROL 0.5-2.5 (3) MG/3ML IN SOLN
3.0000 mL | Freq: Once | RESPIRATORY_TRACT | Status: AC
  Administered 2022-02-24: 3 mL via RESPIRATORY_TRACT
  Filled 2022-02-24: qty 3

## 2022-02-24 NOTE — Telephone Encounter (Signed)
Per chat copied communication regarding G tube   Per Dr Kathlene Cote in [facility] his port in today. We noticed there is an order for gastrostomy. He is not a candidate for gastrostomy after prior esophagectomy and gastric pull-through. If he were to have problems with swallowing/oral intake during treatment, he would need a surgical jejunostomy. He had one after his esophagectomy in 2018.  Per Dr Chryl Heck I see, we got a phone call from your office saying an order is needed=  we didn't place it because of his history. Thanks for letting me know

## 2022-02-24 NOTE — Discharge Instructions (Signed)
Moderate Conscious Sedation, Adult, Care After This sheet gives you information about how to care for yourself after your procedure. Your health care provider may also give you more specific instructions. If you have problems or questions, contact your health care provider. What can I expect after the procedure? After the procedure, it is common to have: Sleepiness for several hours. Impaired judgment for several hours. Difficulty with balance. Vomiting if you eat too soon. Follow these instructions at home: For the time period you were told by your health care provider:     Rest. Do not participate in activities where you could fall or become injured. Do not drive or use machinery. Do not drink alcohol. Do not take sleeping pills or medicines that cause drowsiness. Do not make important decisions or sign legal documents. Do not take care of children on your own. Eating and drinking  Follow the diet recommended by your health care provider. Drink enough fluid to keep your urine pale yellow. If you vomit: Drink water, juice, or soup when you can drink without vomiting. Make sure you have little or no nausea before eating solid foods. General instructions Take over-the-counter and prescription medicines only as told by your health care provider. Have a responsible adult stay with you for the time you are told. It is important to have someone help care for you until you are awake and alert. Do not smoke. Keep all follow-up visits as told by your health care provider. This is important. Contact a health care provider if: You are still sleepy or having trouble with balance after 24 hours. You feel light-headed. You keep feeling nauseous or you keep vomiting. You develop a rash. You have a fever. You have redness or swelling around the IV site. Get help right away if: You have trouble breathing. You have new-onset confusion at home. Summary After the procedure, it is common to  feel sleepy, have impaired judgment, or feel nauseous if you eat too soon. Rest after you get home. Know the things you should not do after the procedure. Follow the diet recommended by your health care provider and drink enough fluid to keep your urine pale yellow. Get help right away if you have trouble breathing or new-onset confusion at home. This information is not intended to replace advice given to you by your health care provider. Make sure you discuss any questions you have with your health care provider. Document Revised: 11/14/2019 Document Reviewed: 06/12/2019 Elsevier Patient Education  [place].     For questions /concerns may call Interventional Radiology at [phone] or  Interventional Radiology clinic [phone]   You may remove your dressing and shower tomorrow afternoon  DO NOT use EMLA cream for 2 weeks after port placement as the cream will remove surgical glue on your incision.     Implanted Port Insertion, Care After The following information offers guidance on how to care for yourself after your procedure. Your health care provider may also give you more specific instructions. If you have problems or questions, contact your health care provider.  Urgent needs- Interventional Radiology clinic- [phone] Wound- May remove dressing in 24-48 hours and shower. Otherwise keep site clean and dry. May replace dressing with clean bandaids as needed.  Your Provider should set up monthly appointments for Port flush.  What can I expect after the procedure? After the procedure, it is common to have: Discomfort at the port insertion site. Bruising on the skin over the port. This should improve over  3-4 days. Follow these instructions at home: [facility] care After your port is placed, you will get a manufacturer's information card. The card has information about your port. Keep this card with you at all times. Take care of the port as told by your health care  provider. Ask your health care provider if you or a family member can get training for taking care of the port at home. A home health care nurse will be be available to help care for the port. Make sure to remember what type of port you have. Incision care     Follow instructions from your health care provider about how to take care of your port insertion site. Make sure you: Wash your hands with soap and water for at least 20 seconds before and after you change your bandage (dressing). If soap and water are not available, use hand sanitizer. Change your dressing as told by your health care provider. Leave stitches (sutures), skin glue, or adhesive strips in place. These skin closures may need to stay in place for 2 weeks or longer. If adhesive strip edges start to loosen and curl up, you may trim the loose edges. Do not remove adhesive strips completely unless your health care provider tells you to do that. Check your port insertion site every day for signs of infection. Check for: Redness, swelling, or pain. Fluid or blood. Warmth. Pus or a bad smell. Activity Return to your normal activities as told by your health care provider. Ask your health care provider what activities are safe for you. You may have to avoid lifting. Ask your health care provider how much you can safely lift. General instructions Take over-the-counter and prescription medicines only as told by your health care provider. Do not take baths, swim, or use a hot tub until site healed. Ask your health care provider if you may take showers. You may only be allowed to take sponge baths. If you were given a sedative during the procedure, it can affect you for several hours. Do not drive or operate machinery until your health care provider says that it is safe. Wear a medical alert bracelet in case of an emergency. This will tell any health care providers that you have a port. Keep all follow-up visits. This is  important. Contact a health care provider if: You cannot flush your port with saline as directed, or you cannot draw blood from the port. You have a fever or chills. You have redness, swelling, or pain around your port insertion site. You have fluid or blood coming from your port insertion site. Your port insertion site feels warm to the touch. You have pus or a bad smell coming from the port insertion site. Get help right away if: You have chest pain or shortness of breath. You have bleeding from your port that you cannot control. These symptoms may be an emergency. Get help right away. Call 911. Do not wait to see if the symptoms will go away. Do not drive yourself to the hospital. Summary Take care of the port as told by your health care provider. Keep the manufacturer's information card with you at all times. Change your dressing as told by your health care provider. Contact a health care provider if you have a fever or chills or if you have redness, swelling, or pain around your port insertion site. Keep all follow-up visits. This information is not intended to replace advice given to you by your health care provider. Make sure  you discuss any questions you have with your health care provider. Document Revised: 01/18/2021 Document Reviewed: 01/18/2021 Elsevier Patient Education  [place].

## 2022-02-24 NOTE — Procedures (Signed)
Interventional Radiology Procedure Note  Procedure: Single Lumen Power Port Placement    Access:  Left IJ vein.  Findings: Catheter tip positioned at SVC/RA junction. Port is ready for immediate use.   Complications: None  EBL: < 10 mL  Recommendations:  - Ok to shower in 24 hours - Do not submerge for 7 days - Routine line care   Masiyah Jorstad T. Mahlik Lenn, M.D Pager:  [phone]   

## 2022-02-24 NOTE — Progress Notes (Signed)
ga

## 2022-02-24 NOTE — H&P (Signed)
Referring Physician(s): Benay Pike  Supervising Physician: Aletta Edouard  Patient Status:  [facility] OP  Chief Complaint:  "I'm getting a port a cath"  Subjective: Patient known to IR service from Port-A-Cath placement in 2018 with removal in 2021 along with left cervical lymph node biopsy on 12/19/2021.  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  Past medical history also significant for arthritis, childhood asthma, COPD, spastic CP, GERD,  hypertension.  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  He denies fever, chest pain, abdominal/back pain, nausea, vomiting or bleeding.  He is an ex smoker and does have some chronic dyspnea, occasional cough, some left-sided neck /head discomfort.   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 Darrell Cunningham underwent genetic counseling for hereditary cancer syndromes on 10/31/2016. Though he is a candidate for genetic testing, he declines at this time.   GERD (gastroesophageal reflux disease)    History of chemotherapy    03-2017   History of radiation therapy    03-2017   Hypertension    Neuromuscular disorder (HCC)    [name]   Pneumonia 4 yrs ago   Past Surgical History:  Procedure Laterality Date   COLONOSCOPY     COMPLETE ESOPHAGECTOMY N/A 04/16/2017   Procedure: ESOPHAGECTOMY COMPLETE,Transhiatal total esophagectomy;  Surgeon: [name], MD;  Location: [facility] OR;  Service: Thoracic;  Laterality: N/A;   DIRECT LARYNGOSCOPY N/A 01/27/2022   Procedure: DIRECT LARYNGOSCOPY  WITH BIOPSIES; FROZEN SECTION;  Surgeon: [name], MD;  Location: [place];  Service: ENT;  Laterality: N/A;   ESOPHAGOGASTRODUODENOSCOPY (EGD) WITH PROPOFOL N/A 03/30/2019   Procedure: ESOPHAGOGASTRODUODENOSCOPY (EGD) WITH PROPOFOL;  Surgeon: Ladene Artist, MD;  Location: [facility] ENDOSCOPY;  Service: Endoscopy;  Laterality: N/A;   EUS N/A 10/26/2016   Procedure: UPPER ENDOSCOPIC ULTRASOUND (EUS) LINEAR;  Surgeon: [name], MD;  Location: [facility] ENDOSCOPY;  Service: Endoscopy;  Laterality: N/A;   EYE SURGERY Bilateral age 54    for cross eyes   FOREIGN BODY REMOVAL  03/30/2019   Procedure: FOREIGN BODY REMOVAL;  Surgeon: Ladene Artist, MD;  Location: [place];  Service: Endoscopy;;   IR FLUORO GUIDE PORT INSERTION RIGHT  11/08/2016   right upper chest   IR REMOVAL TUN ACCESS W/ PORT W/O FL MOD SED  04/12/2020   IR US GUIDE VASC ACCESS RIGHT  11/08/2016   JEJUNOSTOMY N/A 04/16/2017   Procedure: Darrell Cunningham;  Surgeon: [name], MD;  Location: [place];  Service: Thoracic;  Laterality: N/A;   JEJUNOSTOMY     removal of feeding tube /pt unsure of date   MOUTH SURGERY     POLYPECTOMY     TONSILLECTOMY Bilateral 01/27/2022   Procedure: POSSIBLE TONSILLECTOMY;  Surgeon: [name], MD;  Location: [place];  Service: ENT;  Laterality: Bilateral;   UPPER GASTROINTESTINAL ENDOSCOPY     UPPER GASTROINTESTINAL ENDOSCOPY  06/03/2021   hx of esoph ca   VIDEO BRONCHOSCOPY  N/A 04/16/2017   Procedure: VIDEO BRONCHOSCOPY, Transhiatal Total Esophagectomy, Esophagogastrostomy, pyloromotomy, Feeding Jejunostomy;  Surgeon: [name], MD;  Location: [place];  Service: Thoracic;  Laterality: N/A;            Past Medical History:  Diagnosis Date   Allergy    Arthritis    Asthma    as a child   Cancer ([place]) 10/09/2016   ESOPHAGUS CARCINOMA    COPD (chronic obstructive pulmonary disease) (HCC)    CP (cerebral palsy), 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 Darrell Cunningham underwent genetic counseling for hereditary cancer syndromes on 10/31/2016. Though he is a candidate for genetic testing, he declines at this time.   GERD (gastroesophageal reflux disease)    History of chemotherapy    03-2017   History of radiation therapy    03-2017   Hypertension    Neuromuscular disorder (HCC)    [name]   Pneumonia 4 yrs ago   Past Surgical History:  Procedure Laterality Date   COLONOSCOPY     COMPLETE ESOPHAGECTOMY N/A 04/16/2017   Procedure: ESOPHAGECTOMY COMPLETE,Transhiatal total esophagectomy;  Surgeon: [name], MD;  Location: [facility] OR;  Service: Thoracic;  Laterality: N/A;   DIRECT LARYNGOSCOPY N/A 01/27/2022   Procedure: DIRECT LARYNGOSCOPY WITH BIOPSIES; FROZEN SECTION;  Surgeon: [name], MD;  Location: [place];  Service: ENT;  Laterality: N/A;   ESOPHAGOGASTRODUODENOSCOPY (EGD) WITH PROPOFOL N/A 03/30/2019   Procedure: ESOPHAGOGASTRODUODENOSCOPY (EGD) WITH PROPOFOL;  Surgeon: Ladene Artist, MD;  Location: [facility] ENDOSCOPY;  Service: Endoscopy;  Laterality: N/A;   EUS N/A 10/26/2016   Procedure: UPPER ENDOSCOPIC ULTRASOUND (EUS) LINEAR;  Surgeon: [name], MD;  [facility];  Service: Endoscopy;  Laterality: N/A;   EYE SURGERY Bilateral age 51    for cross eyes   FOREIGN BODY REMOVAL  03/30/2019   Procedure: FOREIGN BODY REMOVAL;  Surgeon: Ladene Artist, MD;  Location: [place];  Service: Endoscopy;;   IR FLUORO GUIDE PORT INSERTION RIGHT  11/08/2016   right upper chest   IR REMOVAL TUN ACCESS W/ PORT W/O FL MOD SED  04/12/2020   IR US GUIDE VASC ACCESS RIGHT  11/08/2016   JEJUNOSTOMY N/A 04/16/2017   Procedure: Darrell Cunningham;  Surgeon: [name], MD;  Location: [place];  Service: Thoracic;  Laterality: N/A;   JEJUNOSTOMY     removal of feeding tube /pt unsure of date   MOUTH SURGERY      POLYPECTOMY     TONSILLECTOMY Bilateral 01/27/2022   Procedure: POSSIBLE TONSILLECTOMY;  Surgeon: [name], MD;  Location: [place];  Service: ENT;  Laterality: Bilateral;   UPPER GASTROINTESTINAL ENDOSCOPY     UPPER GASTROINTESTINAL ENDOSCOPY  06/03/2021   hx of esoph ca   VIDEO BRONCHOSCOPY N/A 04/16/2017   Procedure: VIDEO BRONCHOSCOPY, Transhiatal Total Esophagectomy, Esophagogastrostomy, pyloromotomy, Feeding Jejunostomy;  Surgeon: [name], MD;  Location: [facility] OR;  Service: Thoracic;  Laterality: N/A;      Allergies: Penicillins  Medications: Prior to Admission medications   Medication Sig Start Date End Date Taking? Authorizing Provider  acetaminophen (TYLENOL) 500 MG tablet Take 500 mg by mouth every 6 (six) hours as needed for moderate pain.   Yes [provider]  albuterol (VENTOLIN HFA) 108 (90 Base) MCG/ACT inhaler Inhale 2 puffs into the lungs  every 6 (six) hours as needed for wheezing or shortness of breath. 04/25/21  Yes [name], MD  atorvastatin (LIPITOR) 20 MG tablet TAKE 1 TABLET BY MOUTH EVERY DAY 05/12/21  Yes [name], MD  Budeson-Glycopyrrol-Formoterol (BREZTRI AEROSPHERE) 160-9-4.8 MCG/ACT AERO Inhale 2 puffs into the lungs 2 (two) times daily. 01/19/21  Yes [name], MD  cyclobenzaprine (FLEXERIL) 5 MG tablet Take 1 tablet (5 mg total) by mouth 3 (three) times daily as needed for muscle spasms. 01/07/19  Yes [name], MD  dexamethasone (DECADRON) 4 MG tablet Take 2 tablets (8 mg total) by mouth daily. Take daily x 3 days starting the day after cisplatin chemotherapy. Take with food. 02/24/22  Yes Iruku, Arletha Pili, MD  fluticasone (FLONASE) 50 MCG/ACT nasal spray SPRAY 1 SPRAY INTO EACH NOSTRIL EVERY DAY AS NEEDED FOR ALLERGIES/RHINITIS 10/03/21  Yes [name], MD  levETIRAcetam (KEPPRA) 500 MG tablet TAKE 1 TABLET BY MOUTH TWICE A DAY 02/20/22  Yes [name], MD  ondansetron (ZOFRAN) 8 MG tablet Take 1  tablet (8 mg total) by mouth 2 (two) times daily as needed. Start on the third day after cisplatin chemotherapy. 02/24/22  Yes Iruku, Arletha Pili, MD  pantoprazole (PROTONIX) 40 MG tablet TAKE 1 TABLET BY MOUTH TWICE A DAY 01/13/22  Yes [name], MD  prochlorperazine (COMPAZINE) 10 MG tablet Take 1 tablet (10 mg total) by mouth every 6 (six) hours as needed (Nausea or vomiting). 02/24/22  Yes Iruku, Arletha Pili, MD  rOPINIRole (REQUIP) 1 MG tablet Take 1 tablet (1 mg total) by mouth at bedtime. 01/11/21  Yes [name], MD  benzonatate (TESSALON) 100 MG capsule Take 1 capsule (100 mg total) by mouth 3 (three) times daily as needed for cough. Patient not taking: Reported on 01/19/2022 12/22/21   Heilingoetter, Cassandra L, PA-C  lidocaine (XYLOCAINE) 2 % solution Patient: Mix 1part 2% viscous lidocaine, 1part H20. Swish & swallow 26m of diluted mixture, 348m before meals and at bedtime, up to QID 02/13/22   SqEppie GibsonMD  lidocaine-prilocaine (EMLA) cream Apply to affected area once 02/24/22   IrBenay PikeMD  sulfamethoxazole-trimethoprim (BACTRIM DS) 800-160 MG tablet Take 1 tablet by mouth 2 (two) times daily. 01/26/22   PiSusy FrizzleMD     Vital Signs: BP 126/72   Pulse 83   Temp 97.7 F (36.5 C) (Oral)   Resp 18   Ht '4\' 11"'$  (1.499 m)   Wt 122 lb 3.2 oz (55.4 kg)   SpO2 100%   BMI 24.68 kg/m   Physical Exam awake, alert.  Chest with scattered rhonchi/wheezes.  Heart with regular rate and rhythm.  Abdomen soft, positive bowel sounds, nontender.  No lower extremity edema.  Left-sided cervical adenopathy noted, sl tender to palpation.  Imaging: No results found.  Labs:  CBC: Recent Labs    06/19/21 0112 11/22/21 1456 01/27/22 0906  WBC 9.6 7.2 7.3  HGB 12.4* 13.5 13.1  HCT 38.1* 40.5 39.7  PLT 228 221 233    COAGS: No results for input(s): "INR", "APTT" in the last 8760 hours.  BMP: Recent Labs    06/19/21 0112 11/22/21 1456 01/06/22 1314  01/27/22 0906  NA 138 138  --  138  K 3.9 3.7  --  4.0  CL 106 104  --  108  CO2 26 28  --  22  GLUCOSE 151* 104*  --  89  BUN '12 16 18 13  '$ CALCIUM 8.9  9.1  --  9.0  CREATININE 0.85 0.77 0.83 0.88  GFRNONAA >60 >60 >60 >60    LIVER FUNCTION TESTS: Recent Labs    06/19/21 0112 11/22/21 1456  BILITOT 0.4 0.5  AST 19 17  ALT 23 13  ALKPHOS 71 83  PROT 6.6 7.4  ALBUMIN 3.3* 4.1    Assessment and Plan: Patient known to IR service from Port-A-Cath placement in 2018 with removal in 2021 along with left cervical lymph node biopsy on 12/19/2021.  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  Past medical history also significant for arthritis, childhood asthma, COPD, spastic CP, GERD,  hypertension.  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 Risks and benefits of image guided port-a-catheter placement was discussed with the patient including, but not limited to bleeding, infection, pneumothorax, or fibrin sheath development and need for additional procedures.  All of the patient's questions were answered, patient is agreeable to proceed. Consent signed and in chart.    Electronically Signed: D. Rowe Robert, PA-C 02/24/2022, 1:09 PM   I spent a total of 20 minutes at the the patient's bedside AND on the patient's hospital floor or unit, greater than 50% of which was counseling/coordinating care for Port-A-Cath placement

## 2022-02-27 ENCOUNTER — Other Ambulatory Visit: Payer: Self-pay

## 2022-02-27 ENCOUNTER — Ambulatory Visit
Admission: RE | Admit: 2022-02-27 | Discharge: 2022-02-27 | Disposition: A | Discharge status: Home / Self Care | Payer: Medicaid Other | Source: Ambulatory Visit | Attending: Radiation Oncology | Admitting: Radiation Oncology

## 2022-02-27 DIAGNOSIS — C760 Malignant neoplasm of head, face and neck: Secondary | ICD-10-CM | POA: Diagnosis not present

## 2022-02-27 LAB — RAD ONC ARIA SESSION SUMMARY
Course Elapsed Days: 14
Plan Fractions Treated to Date: 11
Plan Prescribed Dose Per Fraction: 2 Gy
Plan Total Fractions Prescribed: 35
Plan Total Prescribed Dose: 70 Gy
Reference Point Dosage Given to Date: 22 Gy
Reference Point Session Dosage Given: 2 Gy
Session Number: 11

## 2022-02-28 ENCOUNTER — Ambulatory Visit
Admission: RE | Admit: 2022-02-28 | Discharge: 2022-02-28 | Disposition: A | Discharge status: Home / Self Care | Payer: Medicaid Other | Source: Ambulatory Visit | Attending: Radiation Oncology | Admitting: Radiation Oncology

## 2022-02-28 ENCOUNTER — Inpatient Hospital Stay: Payer: Medicaid Other | Admitting: Hematology and Oncology

## 2022-02-28 ENCOUNTER — Inpatient Hospital Stay: Payer: Medicaid Other | Attending: Physician Assistant | Admitting: Dietician

## 2022-02-28 ENCOUNTER — Other Ambulatory Visit: Payer: Self-pay

## 2022-02-28 ENCOUNTER — Inpatient Hospital Stay: Payer: Medicaid Other

## 2022-02-28 ENCOUNTER — Encounter: Payer: Self-pay | Admitting: Hematology and Oncology

## 2022-02-28 VITALS — BP 111/73 | HR 88 | Resp 18

## 2022-02-28 VITALS — BP 116/68 | HR 112 | Temp 97.9°F | Resp 16 | Ht 59.0 in | Wt 123.9 lb

## 2022-02-28 DIAGNOSIS — Z8041 Family history of malignant neoplasm of ovary: Secondary | ICD-10-CM | POA: Diagnosis not present

## 2022-02-28 DIAGNOSIS — C770 Secondary and unspecified malignant neoplasm of lymph nodes of head, face and neck: Secondary | ICD-10-CM | POA: Diagnosis not present

## 2022-02-28 DIAGNOSIS — Z803 Family history of malignant neoplasm of breast: Secondary | ICD-10-CM | POA: Diagnosis not present

## 2022-02-28 DIAGNOSIS — K5900 Constipation, unspecified: Secondary | ICD-10-CM | POA: Diagnosis not present

## 2022-02-28 DIAGNOSIS — Z9221 Personal history of antineoplastic chemotherapy: Secondary | ICD-10-CM | POA: Insufficient documentation

## 2022-02-28 DIAGNOSIS — Z9049 Acquired absence of other specified parts of digestive tract: Secondary | ICD-10-CM | POA: Insufficient documentation

## 2022-02-28 DIAGNOSIS — C760 Malignant neoplasm of head, face and neck: Secondary | ICD-10-CM | POA: Insufficient documentation

## 2022-02-28 DIAGNOSIS — R591 Generalized enlarged lymph nodes: Secondary | ICD-10-CM | POA: Diagnosis not present

## 2022-02-28 DIAGNOSIS — C01 Malignant neoplasm of base of tongue: Secondary | ICD-10-CM | POA: Diagnosis present

## 2022-02-28 DIAGNOSIS — Z79899 Other long term (current) drug therapy: Secondary | ICD-10-CM | POA: Insufficient documentation

## 2022-02-28 DIAGNOSIS — Z880 Allergy status to penicillin: Secondary | ICD-10-CM | POA: Insufficient documentation

## 2022-02-28 DIAGNOSIS — I700 Atherosclerosis of aorta: Secondary | ICD-10-CM | POA: Insufficient documentation

## 2022-02-28 DIAGNOSIS — I2510 Atherosclerotic heart disease of native coronary artery without angina pectoris: Secondary | ICD-10-CM | POA: Insufficient documentation

## 2022-02-28 DIAGNOSIS — Z923 Personal history of irradiation: Secondary | ICD-10-CM | POA: Insufficient documentation

## 2022-02-28 DIAGNOSIS — Z95828 Presence of other vascular implants and grafts: Secondary | ICD-10-CM

## 2022-02-28 DIAGNOSIS — J449 Chronic obstructive pulmonary disease, unspecified: Secondary | ICD-10-CM | POA: Diagnosis not present

## 2022-02-28 DIAGNOSIS — E46 Unspecified protein-calorie malnutrition: Secondary | ICD-10-CM | POA: Insufficient documentation

## 2022-02-28 DIAGNOSIS — Z800 Family history of malignant neoplasm of digestive organs: Secondary | ICD-10-CM | POA: Diagnosis not present

## 2022-02-28 DIAGNOSIS — C155 Malignant neoplasm of lower third of esophagus: Secondary | ICD-10-CM

## 2022-02-28 DIAGNOSIS — R590 Localized enlarged lymph nodes: Secondary | ICD-10-CM

## 2022-02-28 DIAGNOSIS — Z5111 Encounter for antineoplastic chemotherapy: Secondary | ICD-10-CM | POA: Diagnosis present

## 2022-02-28 DIAGNOSIS — C779 Secondary and unspecified malignant neoplasm of lymph node, unspecified: Secondary | ICD-10-CM

## 2022-02-28 DIAGNOSIS — Z87891 Personal history of nicotine dependence: Secondary | ICD-10-CM | POA: Insufficient documentation

## 2022-02-28 DIAGNOSIS — Z7952 Long term (current) use of systemic steroids: Secondary | ICD-10-CM | POA: Diagnosis not present

## 2022-02-28 DIAGNOSIS — Z5986 Financial insecurity: Secondary | ICD-10-CM | POA: Diagnosis not present

## 2022-02-28 DIAGNOSIS — Z836 Family history of other diseases of the respiratory system: Secondary | ICD-10-CM | POA: Insufficient documentation

## 2022-02-28 LAB — RAD ONC ARIA SESSION SUMMARY
Course Elapsed Days: 15
Plan Fractions Treated to Date: 12
Plan Prescribed Dose Per Fraction: 2 Gy
Plan Total Fractions Prescribed: 35
Plan Total Prescribed Dose: 70 Gy
Reference Point Dosage Given to Date: 24 Gy
Reference Point Session Dosage Given: 2 Gy
Session Number: 12

## 2022-02-28 LAB — CBC WITH DIFFERENTIAL (CANCER CENTER ONLY)
Abs Immature Granulocytes: 0.02 10*3/uL (ref 0.00–0.07)
Basophils Absolute: 0 10*3/uL (ref 0.0–0.1)
Basophils Relative: 1 %
Eosinophils Absolute: 0.2 10*3/uL (ref 0.0–0.5)
Eosinophils Relative: 3 %
HCT: 38.5 % — ABNORMAL LOW (ref 39.0–52.0)
Hemoglobin: 13.1 g/dL (ref 13.0–17.0)
Immature Granulocytes: 0 %
Lymphocytes Relative: 9 %
Lymphs Abs: 0.5 10*3/uL — ABNORMAL LOW (ref 0.7–4.0)
MCH: 31 pg (ref 26.0–34.0)
MCHC: 34 g/dL (ref 30.0–36.0)
MCV: 91 fL (ref 80.0–100.0)
Monocytes Absolute: 0.4 10*3/uL (ref 0.1–1.0)
Monocytes Relative: 7 %
Neutro Abs: 4.5 10*3/uL (ref 1.7–7.7)
Neutrophils Relative %: 80 %
Platelet Count: 190 10*3/uL (ref 150–400)
RBC: 4.23 MIL/uL (ref 4.22–5.81)
RDW: 13.1 % (ref 11.5–15.5)
WBC Count: 5.5 10*3/uL (ref 4.0–10.5)
nRBC: 0 % (ref 0.0–0.2)

## 2022-02-28 LAB — BASIC METABOLIC PANEL - CANCER CENTER ONLY
Anion gap: 6 (ref 5–15)
BUN: 17 mg/dL (ref 8–23)
CO2: 27 mmol/L (ref 22–32)
Calcium: 9.3 mg/dL (ref 8.9–10.3)
Chloride: 106 mmol/L (ref 98–111)
Creatinine: 0.63 mg/dL (ref 0.61–1.24)
GFR, Estimated: >60 mL/min (ref >60)
Glucose, Bld: 99 mg/dL (ref 70–99)
Potassium: 3.6 mmol/L (ref 3.5–5.1)
Sodium: 139 mmol/L (ref 135–145)

## 2022-02-28 LAB — MAGNESIUM: Magnesium: 1.8 mg/dL (ref 1.7–2.4)

## 2022-02-28 MED ORDER — SODIUM CHLORIDE 0.9 % IV SOLN
40.0000 mg/m2 | Freq: Once | INTRAVENOUS | Status: AC
  Administered 2022-02-28: 61 mg via INTRAVENOUS
  Filled 2022-02-28: qty 61

## 2022-02-28 MED ORDER — PALONOSETRON HCL INJECTION 0.25 MG/5ML
0.2500 mg | Freq: Once | INTRAVENOUS | Status: AC
  Administered 2022-02-28: 0.25 mg via INTRAVENOUS
  Filled 2022-02-28: qty 5

## 2022-02-28 MED ORDER — SODIUM CHLORIDE 0.9 % IV SOLN
150.0000 mg | Freq: Once | INTRAVENOUS | Status: AC
  Administered 2022-02-28: 150 mg via INTRAVENOUS
  Filled 2022-02-28: qty 150

## 2022-02-28 MED ORDER — SODIUM CHLORIDE 0.9% FLUSH
10.0000 mL | Freq: Once | INTRAVENOUS | Status: AC
  Administered 2022-02-28: 10 mL

## 2022-02-28 MED ORDER — HEPARIN SOD (PORK) LOCK FLUSH 100 UNIT/ML IV SOLN
500.0000 [IU] | Freq: Once | INTRAVENOUS | Status: AC | PRN
  Administered 2022-02-28: 500 [IU]

## 2022-02-28 MED ORDER — SODIUM CHLORIDE 0.9 % IV SOLN
10.0000 mg | Freq: Once | INTRAVENOUS | Status: AC
  Administered 2022-02-28: 10 mg via INTRAVENOUS
  Filled 2022-02-28: qty 10

## 2022-02-28 MED ORDER — POTASSIUM CHLORIDE IN NACL 20-0.9 MEQ/L-% IV SOLN
Freq: Once | INTRAVENOUS | Status: AC
  Filled 2022-02-28: qty 1000

## 2022-02-28 MED ORDER — SODIUM CHLORIDE 0.9% FLUSH
10.0000 mL | INTRAVENOUS | Status: DC | PRN
  Administered 2022-02-28: 10 mL

## 2022-02-28 MED ORDER — SODIUM CHLORIDE 0.9 % IV SOLN: Freq: Once | INTRAVENOUS | Status: AC

## 2022-02-28 MED ORDER — MAGNESIUM SULFATE 2 GM/50ML IV SOLN
2.0000 g | Freq: Once | INTRAVENOUS | Status: AC
  Administered 2022-02-28: 2 g via INTRAVENOUS
  Filled 2022-02-28: qty 50

## 2022-02-28 NOTE — Progress Notes (Signed)
[facility] OFFICE PROGRESS NOTE  Darrell Frizzle, MD [address] [place] [address]  DIAGNOSIS: Follow up left neck node biopsy result   Oncology History Overview Note  Cancer Staging Esophageal cancer [facility]) Staging form: Esophagus - Adenocarcinoma, AJCC 8th Edition - Clinical stage from 10/26/2016: Stage III (cT3, cN1, cM0) - Signed by [name], MD on 11/05/2016     Malignant neoplasm of lower third of esophagus ([place])  09/21/2016 - 09/21/2016 Hospital Admission   esophageal pain and vomiting up blood   10/09/2016 Initial Diagnosis   Esophageal cancer ([place])   10/09/2016 Procedure   EGD 1. Partially obstructing, likely malignant esophageal tumor was found in the lower third of the esophagus. Multiple biopsies.  2. Mass visible during gastric retroflexion at GE junction 3. Otherwise normal stomach 4. Normal examined duodenum    10/09/2016 Pathology Results   Esophagus, biopsy, distal esophageal tumor (33-39) - SUSPICIOUS FOR ADENOCARCINOMA   10/12/2016 Imaging   CT CAP w Contrast IMPRESSION: Distal esophageal mass compatible with primary esophageal malignancy. There are 2 adjacent abnormal appearing subcentimeter paraesophageal lymph nodes which may represent nodal metastasis. Additionally there is a prominent nonspecific 8 mm upper abdominal lymph node.   No evidence for distant metastatic disease in the chest, abdomen or pelvis.   10/24/2016 PET scan   1. Markedly hypermetabolic distal esophageal lesion, compatible with malignancy. Adjacent small paraesophageal lymph nodes are abnormal by CT but cannot be resolved as separate structures from the hypermetabolic esophageal activity on the PET images. No hypermetabolism is demonstrated in the upper abdominal/gastrohepatic ligament lymph node although the small size of this lymph node may be below threshold for detection on PET imaging. 2. No evidence for distant hypermetabolic metastatic disease  in the neck, chest, abdomen, or pelvis.   10/26/2016 Pathology Results   Esophagogastric junction, biopsy, mass - INVASIVE ADENOCARCINOMA.   10/26/2016 Procedure   EUS showed uT3N1 disease, and biopsy confirmed adenocarcinoma    10/30/2016 - 12/07/2016 Radiation Therapy   Neoadjuvant radiation to esophageal cancer  Under the care of Dr. [name]   10/31/2016 - 12/07/2016 Neo-Adjuvant Chemotherapy   Weekly Carboplatin AUC 2 and taxol '50mg'$ /m2 with concurrent radiation     12/14/2016 Imaging   CT AP W Contrast 12/14/16 IMPRESSION: 1. Mildly dilated short segment of proximal jejunum without bowel wall thickening or significant inflammatory changes. Findings may represent focal ileus or radiation enteritis. No evidence for obstruction. 2. Significantly improved appearance of the distal esophagus/proximal stomach with decreased wall thickening (1.1 cm from 2.3 cm), and smaller paraesophageal and gastrohepatic ligament lymph nodes. 3. No bowel obstruction.  Normal appendix.   01/18/2017 Imaging   CT CAP W Contrast 01/18/17 IMPRESSION: 1. Mild stable distal esophageal wall thickening likely due to radiation change. No findings for recurrent tumor. Small paraesophageal lymph nodes are also stable. 2. No findings for metastatic disease. 3. Stable mild/early emphysematous changes and age advanced atherosclerotic calcifications involving the thoracic and abdominal aorta and branch vessels.   04/12/2017 Imaging   CT Chest and Abdomen W Contrast 04/12/17  IMPRESSION: 1. Mild stable distal esophageal wall thickening. No findings for recurrent tumor. 2. Stable small mediastinal and left supraclavicular lymph nodes. 3. There is a tiny nodule within the medial right lower lobe measuring 3 mm. New from previous exam. Nonspecific. Attention on follow-up imaging advise. 4.  Aortic Atherosclerosis (ICD10-I70.0). 5. Three vessel coronary artery calcification   04/16/2017 Surgery   VIDEO BRONCHOSCOPY,  Transhiatal Total Esophagectomy, Esophagogastrostomy,  pyloromotomy, Feeding Jejunostomy ESOPHAGECTOMY COMPLETE,Transhiatal total esophagectomy JEJUNOSTOMY,Feeding by Dr. [name] 04/16/17   04/16/2017 Pathology Results   Diagnosis 04/16/17  1. Omentum, resection for tumor - BENIGN ADIPOSE TISSUE CONSISTENT WITH OMENTUM. - NO EVIDENCE OF MALIGNANCY. 2. Esophagus, resection, w/ GE junction - FIBROSIS WITH PATCHY CHRONIC INFLAMMATION. - NO RESIDUAL CARCINOMA IDENTIFIED. - MARGINS NOT INVOLVED. - TWELVE LYMPH NODES WITH NO METASTATIC CARCINOMA (0/12).    07/16/2017 Procedure   Upper GI Endoscopy Findings: per Dr. [name] - Food was found in the upper third of the esophagus. Removal of food was accomplished with Jabier Mutton net. - A partial esophagectomy anastomosis was found in the upper third of the esophagus (20 cm from incisors). This was characterized by severe stenosis, an intact staple line and visible sutures. The standard adult upper endoscope would not pass before dilation. A TTS dilator was passed through the scope. Dilation with an 8.5-9.5-10.5 mm and then 06-11-12 mm balloon dilator was performed to 12 mm (inspection after 9.5 mm, 10.5 mm, 11 mm and 12 mm). The dilation site was examined and showed moderate and significant improvement in luminal narrowing. Estimated blood loss was minimal. After dilation to 12 mm the endoscope was able to transverse the anastomosis with minimal pressure. - A medium amount of food (residue) was found in the gastric body. - The exam of the stomach was otherwise normal. - The examined duodenum was normal.   08/13/2017 Procedure   Upper GI Endoscopy Findings: per Dr. [name] - Food was found in the proximal esophagus. Removal of food was accomplished with Jabier Mutton net. - One severe (stenosis; an endoscope cannot pass) benign-appearing, intrinsic stenosis was found 20 cm from the incisors. This measured 1 cm (in length) and was traversed after dilation. A TTS  dilator was passed through the scope. Dilation with an 06-11-12 mm balloon dilator was performed to 13 mm (after 46m and 12 mm). The dilation site was examined and showed moderate improvement in luminal narrowing. At the stricture there is a visible staple on the proximal side and visible suture material on the gastric side. - A medium amount of food (residue) -the duodenum was normal    08/31/2017 Procedure   Endoscopic needle-knife excision of anastomotic strict[facility]  09/11/2017 Imaging   CT CAP W CONTRAST IMPRESSION: 1. Interval esophagectomy with gastric pull-through. No demonstrated complication. There is retained ingested material within the intrathoracic portion of the stomach. 2. No evidence of local recurrence or metastatic disease. 3. Stable small left supraclavicular and superior mediastinal lymph nodes. 4. Mild lower lobe paramediastinal pulmonary opacity bilaterally, likely atelectasis or sequela of prior radiation therapy. 5.  Aortic Atherosclerosis (ICD10-I70.0).     02/28/2018 Procedure   02/28/2018 Upper endoscopy Impression - Benign-appearing esophageal stenosis. Dilated. - A previous surgical anastomosis was found in the proximal stomach. - Normal examined duodenum. - No specimens collected.   04/10/2018 Imaging   04/10/2018 CT CA IMPRESSION: 1. No evidence of metastatic disease. 2. Aortic atherosclerosis (ICD10-170.0). Coronary artery calcification. 3.  Emphysema (ICD10-J43.9).   03/20/2019 Imaging   CT CAP W contrast 03/20/19  IMPRESSION: 1. Stable exam. No new or progressive interval findings to suggest recurrent/metastatic disease. 2.  Aortic Atherosclerois (ICD10-170.0)   03/30/2019 Procedure   EGD with Dr SFuller Plan8/30/20  IMPRESSION - Food impaction in the proximal esophagus. Removal was successful. 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 - Ectopic gastric mucosa in the proximal esophagus. - Prior esophagectomy, gastric pull up.  Sutures noted in  gastric body. - A medium amount of food (residue) in the stomach that obscured visualization. - Normal duodenal bulb and second portion of the duodenum.   05/26/2019 Procedure   Upper Endoscopy by Dr. [name] 05/26/19  IMPRESSION - Benign-appearing esophageal stenosis at anastomosis. Dilated to 19 mm with balloon. Injected with steroid. 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 - Normal examined duodenum. - No specimens collected.   03/09/2020 Imaging   CT CAP w contrast  IMPRESSION: Stable exam. No evidence of recurrent or metastatic carcinoma within the chest, abdomen, or pelvis.   Aortic Atherosclerosis (ICD10-I70.0). Coronary artery atherosclerosis.   06/03/2021 Procedure   Dr. [name]  IMPRESSION: An esophagogastric anastomosis was found without esophagitis or recurrent malignancy. Dilated to 20 mm with balloon. 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 - Normal examined duodenum. - No specimens collected.   02/27/2022 -  Chemotherapy   Patient is on Treatment Plan : HEAD/NECK Cisplatin q7d     Head and neck cancer ([place])  12/09/2021 Imaging   CT Soft Tissue Neck W Contrast  IMPRESSION: 1. Large, heterogeneous left level 2A lymph node, consistent with metastatic disease. 2. Hyperenhancing appearance of the salivary glands may be a sequela of radiation treatment. 3. Please see the report of the CT of the chest, abdomen and pelvis performed the same day for evaluation of findings below the thoracic inlet.   12/09/2021 Imaging   CT Chest W Contrast, Abdomen, and pelvis   IMPRESSION: 1. Status post pull-through esophagectomy. 2. No evidence of metastatic disease within the chest, abdomen, or pelvis. Specifically, no additional lymphadenopathy identified. 3. Mild, diffuse bilateral bronchial wall thickening and background of fine centrilobular pulmonary nodularity, most commonly seen in smoking-related respiratory  bronchiolitis. 4. Coronary artery disease.      12/19/2021 Procedure   [place] CORE BIOPSY     12/19/2021 Pathology Results   CASE: [id number]  A. 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     12/22/2021 Initial Diagnosis   Head and neck cancer ([place])   Malignant neoplasm of base of tongue ([place])  02/20/2022 Initial Diagnosis   Malignant neoplasm of base of tongue ([place])   02/20/2022 Cancer Staging   Staging form: Pharynx - HPV-Mediated Oropharynx, AJCC 8th Edition - Clinical stage from 02/20/2022: Stage I (cT1, cN1, cM0, p16+) - Signed by Eppie Gibson, MD on 02/20/2022 Stage prefix: Initial diagnosis   02/27/2022 -  Chemotherapy   Patient is on Treatment Plan : HEAD/NECK Cisplatin q7d       CURRENT THERAPY: Pending workup for newly diagnosed head/neck squamous cell carcinoma   Oncologic History  To review his history, the patient was diagnosed with stage III esophageal cancer in 2018, underwent neoadjuvant chemoradiation followed by surgery with no residual disease now undergoes surveillance endoscopies, return with the palpable lymphadenopathy in left cervical region.   The area was not initially erythematous or sore in general to palpation.  He denies any other palpable adenopathy. He denies any oral lesions. He is a former smoker having quit in 2014.  Denies ever chewing tobacco. He has dentures.   A restaging CT scan was performed. His CAP did not show any concern for disease progression  from his esophageal cancer, however, the left cervical node is concerning for metastatic nodal disease. Therefore, this was biopsied on 12/19/21.   Pathology from the lymph node biopsy showed a keratinizing moderately differentiated squamous cell carcinoma p16 positive He had PET/CT in June 2023 which did not show any evidence of primary, hypermetabolic mass or lymphadenopathy in the left upper neck levels 2A and 2B, no other sites of malignancy identified.   Interval history  Patient is here for follow-up.  He started noticing some pain in the throat but he still able to eat and drink everything.  He has been able to maintain his weight.  His left-sided lymphadenopathy has started to shrink.  He is getting his first cycle of chemotherapy today. No change in breathing or bowel habits or urinary habits.  No new neurological complaints. Rest of the pertinent 10 point ROS reviewed and negative  MEDICAL HISTORY: Past Medical History:  Diagnosis Date   Allergy    Arthritis    Asthma    as a child   Cancer ([place]) 10/09/2016   ESOPHAGUS CARCINOMA    COPD (chronic obstructive pulmonary disease) (HCC)    CP (cerebral palsy), spastic (HCC)    right   Dysphagia    Dyspnea    with exersion   Emphysema of lung ([place])    Encounter for nonprocreative genetic counseling 10/31/2016   Mr. Biermann underwent genetic counseling for hereditary cancer syndromes on 10/31/2016. Though he is a candidate for genetic testing, he declines at this time.   GERD (gastroesophageal reflux disease)    History of chemotherapy    03-2017   History of radiation therapy    03-2017   Hypertension    Neuromuscular disorder (HCC)    [name]   Pneumonia 4 yrs ago    ALLERGIES:  is allergic to penicillins.  MEDICATIONS:  Current Outpatient Medications  Medication Sig Dispense Refill   acetaminophen (TYLENOL) 500 MG tablet Take 500 mg by mouth every 6 (six) hours as needed for moderate pain.     albuterol (VENTOLIN  HFA) 108 (90 Base) MCG/ACT inhaler Inhale 2 puffs into the lungs every 6 (six) hours as needed for wheezing or shortness of breath. 8 g 3   atorvastatin (LIPITOR) 20 MG tablet TAKE 1 TABLET BY MOUTH EVERY DAY 90 tablet 3   benzonatate (TESSALON) 100 MG capsule Take 1 capsule (100 mg total) by mouth 3 (three) times daily as needed for cough. (Patient not taking: Reported on 01/19/2022) 30 capsule 2   Budeson-Glycopyrrol-Formoterol (BREZTRI AEROSPHERE) 160-9-4.8 MCG/ACT AERO Inhale 2 puffs into the lungs 2 (two) times daily. 10.7 g 11   cyclobenzaprine (FLEXERIL) 5 MG tablet Take 1 tablet (5 mg total) by mouth 3 (three) times daily as needed for muscle spasms. 30 tablet 0   dexamethasone (DECADRON) 4 MG tablet Take 2 tablets (8 mg total) by mouth daily. Take daily x 3 days starting the day after cisplatin chemotherapy. Take with food. 30 tablet 1   fluticasone (FLONASE) 50 MCG/ACT nasal spray SPRAY 1 SPRAY INTO EACH NOSTRIL EVERY DAY AS NEEDED FOR ALLERGIES/RHINITIS 16 mL 2   levETIRAcetam (KEPPRA) 500 MG tablet TAKE 1 TABLET BY MOUTH TWICE A DAY 60 tablet 1   lidocaine (XYLOCAINE) 2 % solution Patient: Mix 1part 2% viscous lidocaine, 1part H20. Swish & swallow 3m of diluted mixture,  56mn before meals and at bedtime, up to QID 200 mL 3   lidocaine-prilocaine (EMLA) cream Apply to affected area once 30 g 3   ondansetron (ZOFRAN) 8 MG tablet Take 1 tablet (8 mg total) by mouth 2 (two) times daily as needed. Start on the third day after cisplatin chemotherapy. 30 tablet 1   pantoprazole (PROTONIX) 40 MG tablet TAKE 1 TABLET BY MOUTH TWICE A DAY 180 tablet 0   prochlorperazine (COMPAZINE) 10 MG tablet Take 1 tablet (10 mg total) by mouth every 6 (six) hours as needed (Nausea or vomiting). 30 tablet 1   rOPINIRole (REQUIP) 1 MG tablet Take 1 tablet (1 mg total) by mouth at bedtime. 30 tablet 5   sulfamethoxazole-trimethoprim (BACTRIM DS) 800-160 MG tablet Take 1 tablet by mouth 2 (two) times daily. 14  tablet 0   No current facility-administered medications for this visit.    SURGICAL HISTORY:  Past Surgical History:  Procedure Laterality Date   COLONOSCOPY     COMPLETE ESOPHAGECTOMY N/A 04/16/2017   Procedure: ESOPHAGECTOMY COMPLETE,Transhiatal total esophagectomy;  Surgeon: GGrace Isaac MD;  [place]  Service: Thoracic;  Laterality: N/A;   DIRECT LARYNGOSCOPY N/A 01/27/2022   Procedure: DIRECT LARYNGOSCOPY WITH BIOPSIES; FROZEN SECTION;  Surgeon: RIzora Gala MD;  [facility]  Service: ENT;  Laterality: N/A;   ESOPHAGOGASTRODUODENOSCOPY (EGD) WITH PROPOFOL N/A 03/30/2019   Procedure: ESOPHAGOGASTRODUODENOSCOPY (EGD) WITH PROPOFOL;  Surgeon: SLadene Artist MD;  [facility];  Service: Endoscopy;  Laterality: N/A;   EUS N/A 10/26/2016   Procedure: UPPER ENDOSCOPIC ULTRASOUND (EUS) LINEAR;  Surgeon: DMilus Banister MD;  Location: [facility];  Service: Endoscopy;  Laterality: N/A;   EYE SURGERY Bilateral age 61   for cross eyes   FOREIGN BODY REMOVAL  03/30/2019   Procedure: FOREIGN BODY REMOVAL;  Surgeon: SLadene Artist MD;  [place] [facility]  Service: Endoscopy;;   IR FLUORO GUIDE PORT INSERTION RIGHT  11/08/2016   right upper chest   IR IMAGING GUIDED PORT INSERTION  02/24/2022   IR REMOVAL TUN ACCESS W/ PORT W/O FL MOD SED  04/12/2020   IR [place] VASC ACCESS RIGHT  11/08/2016   JEJUNOSTOMY N/A 04/16/2017   Procedure: JDonney Rankins  Surgeon: GGrace Isaac MD;  [facility] [facility]  Service: Thoracic;  Laterality: N/A;   JEJUNOSTOMY     removal of feeding tube /pt unsure of date   MOUTH SURGERY     POLYPECTOMY     TONSILLECTOMY Bilateral 01/27/2022   Procedure: POSSIBLE TONSILLECTOMY;  Surgeon: RIzora Gala MD;  [facility]  Service: ENT;  Laterality: Bilateral;   UPPER GASTROINTESTINAL ENDOSCOPY     UPPER GASTROINTESTINAL ENDOSCOPY  06/03/2021   hx of esoph ca   VIDEO BRONCHOSCOPY N/A 04/16/2017   Procedure: VIDEO  BRONCHOSCOPY, Transhiatal Total Esophagectomy, Esophagogastrostomy, pyloromotomy, Feeding Jejunostomy;  Surgeon: GGrace Isaac MD;  [facility];  Service: Thoracic;  Laterality: N/A;    REVIEW OF SYSTEMS:    Constitutional: Negative for appetite change, chills, fatigue, fever and unexpected weight change.  HENT: Positive for occasional dysphagia since surgery. Negative for mouth sores, nosebleeds, sore throat . Eyes: Negative for eye problems and icterus.  Respiratory: Positive for stable dyspnea on exertion and slightly worsening chronic cough.  Negative for hemoptysis and wheezing.   Cardiovascular: Negative for chest pain and leg swelling.  Gastrointestinal: Positive for mild intermittent LUQ pain with certain movements. Positive for occasional dysphagia/"regurgitation". Negative for constipation, diarrhea, nausea  and vomiting.  Genitourinary: Negative for bladder incontinence, difficulty urinating, dysuria, frequency and hematuria.   Musculoskeletal: Negative for back pain, gait problem, neck pain and neck stiffness.  Skin: Negative for itching and rash.  Neurological: Negative for dizziness, extremity weakness, gait problem, headaches, light-headedness and seizures.  Hematological: Positive for left cervical lymph node enlargement. Does not bruise/bleed easily.  Psychiatric/Behavioral: Negative for confusion, depression and sleep disturbance. The patient is not nervous/anxious.    PHYSICAL EXAMINATION:   Blood pressure 116/68, pulse (!) 112, temperature 97.9 F (36.6 C), temperature source Temporal, resp. rate 16, height '4\' 11"'$  (1.499 m), weight 123 lb 14.4 oz (56.2 kg), SpO2 96 %.  ECOG PERFORMANCE STATUS: 1  Physical Exam Constitutional:      Appearance: Normal appearance.  Cardiovascular:     Rate and Rhythm: Normal rate and regular rhythm.     Pulses: Normal pulses.     Heart sounds: Normal heart sounds.  Chest:     Comments: Left-sided port site appears  well Musculoskeletal:        General: No swelling or tenderness.     Cervical back: Normal range of motion and neck supple. No rigidity.  Lymphadenopathy:     Cervical: Cervical adenopathy (Left-sided cervical lymphadenopathy improved compared to last week) present.  Skin:    General: Skin is warm and dry.  Neurological:     General: No focal deficit present.     Mental Status: He is alert.  Psychiatric:        Mood and Affect: Mood normal.    LABORATORY DATA: Lab Results  Component Value Date   WBC 5.5 02/28/2022   HGB 13.1 02/28/2022   HCT 38.5 (L) 02/28/2022   MCV 91.0 02/28/2022   PLT 190 02/28/2022      Chemistry      Component Value Date/Time   NA 139 02/28/2022 0821   NA 137 06/14/2017 0840   K 3.6 02/28/2022 0821   K 3.9 06/14/2017 0840   CL 106 02/28/2022 0821   CO2 27 02/28/2022 0821   CO2 22 06/14/2017 0840   BUN 17 02/28/2022 0821   BUN 10.2 06/14/2017 0840   CREATININE 0.63 02/28/2022 0821   CREATININE 0.7 06/14/2017 0840      Component Value Date/Time   CALCIUM 9.3 02/28/2022 0821   CALCIUM 9.5 06/14/2017 0840   ALKPHOS 83 11/22/2021 1456   ALKPHOS 132 06/14/2017 0840   AST 17 11/22/2021 1456   AST 22 06/14/2017 0840   ALT 13 11/22/2021 1456   ALT 26 06/14/2017 0840   BILITOT 0.5 11/22/2021 1456   BILITOT 0.38 06/14/2017 0840       RADIOGRAPHIC STUDIES:  IR IMAGING GUIDED PORT INSERTION  Result Date: 02/24/2022 CLINICAL DATA:  Squamous carcinoma of base of tongue and history of prior esophageal carcinoma and previous port placement fee of right internal jugular vein in 2018. That port was removed in 2021. He now requires a new Port-A-Cath for chemotherapy for squamous carcinoma. EXAM: IMPLANTED PORT A CATH PLACEMENT WITH ULTRASOUND AND FLUOROSCOPIC GUIDANCE ANESTHESIA/SEDATION: Moderate (conscious) sedation was employed during this procedure. A total of Versed 2.0 mg and Fentanyl 100 mcg was administered intravenously. Moderate Sedation Time:  36 minutes. The patient's level of consciousness and vital signs were monitored continuously by radiology nursing throughout the procedure under my direct supervision. FLUOROSCOPY: 1.0 mGy. PROCEDURE: The procedure, risks, benefits, and alternatives were explained to the patient. Questions regarding the procedure were encouraged and answered. The patient understands and consents  to the procedure. A time-out was performed prior to initiating the procedure. Ultrasound was performed of both right and left neck to assess jugular patency. Ultrasound was utilized to confirm patency of the left internal jugular vein. A permanent ultrasound image was recorded. The left neck and chest were prepped with chlorhexidine in a sterile fashion, and a sterile drape was applied covering the operative field. Maximum barrier sterile technique with sterile gowns and gloves were used for the procedure. Local anesthesia was provided with 1% lidocaine. After creating a small venotomy incision, a 21 gauge needle was advanced into the left internal jugular vein under direct, real-time ultrasound guidance. Ultrasound image documentation was performed. After securing guidewire access, an 8 Fr dilator was placed. A J-wire was kinked to measure appropriate catheter length. A subcutaneous port pocket was then created along the upper chest wall utilizing sharp and blunt dissection. Portable cautery was utilized. The pocket was irrigated with sterile saline. A single lumen power injectable port was chosen for placement. The 8 Fr catheter was tunneled from the port pocket site to the venotomy incision. The port was placed in the pocket. External catheter was trimmed to appropriate length based on guidewire measurement. At the venotomy, an 8 Fr peel-away sheath was placed over a guidewire. The catheter was then placed through the sheath and the sheath removed. Final catheter positioning was confirmed and documented with a fluoroscopic spot image. The  port was accessed with a needle and aspirated and flushed with heparinized saline. The access needle was removed. The venotomy and port pocket incisions were closed with subcutaneous 3-0 Monocryl and subcuticular 4-0 Vicryl. Dermabond was applied to both incisions. COMPLICATIONS: COMPLICATIONS None FINDINGS: Initial ultrasound demonstrates diminutive jugular vein on the right which is either stenotic from prior catheter placement or chronically occluded. Due to this appearance, decision was made to place the new port via the left jugular vein. After catheter placement, the tip lies at the cavo-atrial junction. The catheter aspirates normally and is ready for immediate use. IMPRESSION: Placement of single lumen port a cath via left internal jugular vein. The catheter tip lies at the cavo-atrial junction. A power injectable port a cath was placed and is ready for immediate use. Electronically Signed   By: Aletta Edouard M.D.   On: 02/24/2022 16:35     ASSESSMENT/PLAN:  ALY HAUSER is a 61 y.o. male with    1. Low esophageal Adenocarcinoma, cT3N1M0, stage III, ypT0N0   -He was diagnosed in 09/2016. He is s/p neoadjuvant ChemoRT and esophagectomy.  -He achieved complete pathological response to neoadjuvant therapy  -His risk of recurrence is low. Dr. Burr Medico recommended cancer surveillance for 5 years. -He has had multiple EGD for dilatation. His last EGD with Dr [name] in 05/2021 was benign. Last CT CAP in 02/2020 was NED.  --Most recent PET scan from December 29, 2021 without any evidence of esophageal carcinoma. -Presently, no evidence of disease recurrence from his prior esophageal adenocarcinoma.   2. 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  -The patient endorsed a new left cervical lymph node enlarged in March 2023 -CT of the neck noted large heterogeneous left level 2A lymph node consistent with metastatic disease.  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 This was also noted to be p16 positive.  PET scan with no evidence of primary, no metastatic disease.   Targeted biopsy showed squamous cell carcinoma from the base of the tongue.  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  He started radiation last week, definitive RT for 7 weeks.  He is here for follow-up to see if he will need any additional chemotherapy.  Since his lymph node is over 3 cm on imaging, we have discussed about considering chemotherapy concurrent with radiation however patient is really reluctant especially with his past medical history of chemoradiation for esophageal cancer.  After some discussion with family, he agreed to try chemo. We will at least attempt 5 weeks concurrent with radiation of weekly cisplatin 40 mg/m2. Discussed mechanism of action, adverse effects in detail. -Okay to proceed with treatment as planned if labs are all within parameters.  He will return to clinic in 1 week as scheduled.  2. Malnutrition and weight loss, Esophageal stricture  --He has had multiple esophageal stretch. He had esophageal obstruction due to food impaction in 03/2019.  -Last EGD in 05/2021. There was no evidence of diseases recurrence.  -His weight today is about 123 -He should continue follow-up with nutrition -We have once again discussed about small meals multiple times.  3. History of heavy smoking -He has quit smoking completely in 2014, history of 40 pack year since age 24.     Plan:As mentioned above   No orders of the defined types were placed in this encounter.   Benay Pike, PA-C 02/28/22

## 2022-02-28 NOTE — Progress Notes (Signed)
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 Patient does not have feeding tube.   Met with patient and daughter in infusion. Patient reports things have been "chaotic" the last week. Says he has been stressed trying to get everything done in order to start chemotherapy. Patient reports throat is mildly sore. He is tolerating regular textures at this time. Patient eating pack of peanut butter crackers at visit. He reports drinking one Ensure Enlive. Daughter says he drinks this too quickly and sometimes this makes him vomit. Patient agreeable to drinking Ensure today, he would like to try strawberry. Patient likes this flavor. Reports the smell of vanilla Ensure makes him feel nauseas. Patient has not tried shake recipes yet. Reports eating bowl of ice cream in the evenings.    Medications: reviewed   Labs: reviewed   Anthropometrics: Last (aria) weight 124.6 lb on 7/31 increased   7/5 - 123 lb    NUTRITION DIAGNOSIS: Predicted suboptimal intake ongoing    INTERVENTION:  Reinforced education on adequate calories and protein to minimize weight loss and decrease need for Jtube  Encouraged smaller more frequent meals with soft smooth foods - handout with soft moist high protein foods provided Reviewed ways to add calories to foods Suggested adding syrup to vanilla Ensure to change flavor as well as pouring into cup with lid to mask off putting smell - recommend 2 Ensure Plus/equivalent daily   MONITORING, EVALUATION, GOAL: weight trends, intake    NEXT VISIT: Tuesday August 8 during infusion

## 2022-02-28 NOTE — Patient Instructions (Signed)
[facility]  Discharge Instructions: Thank you for choosing [facility] to provide your oncology and hematology care.   If you have a lab appointment with the [facility], please go directly to the [facility] and check in at the registration area.   Wear comfortable clothing and clothing appropriate for easy access to any Portacath or PICC line.   We strive to give you quality time with your provider. You may need to reschedule your appointment if you arrive late (15 or more minutes).  Arriving late affects you and other patients whose appointments are after yours.  Also, if you miss three or more appointments without notifying the office, you may be dismissed from the clinic at the provider's discretion.      For prescription refill requests, have your pharmacy contact our office and allow 72 hours for refills to be completed.    Today you received the following chemotherapy and/or immunotherapy agents: cisplatin      To help prevent nausea and vomiting after your treatment, we encourage you to take your nausea medication as directed.  BELOW ARE SYMPTOMS THAT SHOULD BE REPORTED IMMEDIATELY: *FEVER GREATER THAN 100.4 F (38 C) OR HIGHER *CHILLS OR SWEATING *NAUSEA AND VOMITING THAT IS NOT CONTROLLED WITH YOUR NAUSEA MEDICATION *UNUSUAL SHORTNESS OF BREATH *UNUSUAL BRUISING OR BLEEDING *URINARY PROBLEMS (pain or burning when urinating, or frequent urination) *BOWEL PROBLEMS (unusual diarrhea, constipation, pain near the anus) TENDERNESS IN MOUTH AND THROAT WITH OR WITHOUT PRESENCE OF ULCERS (sore throat, sores in mouth, or a toothache) UNUSUAL RASH, SWELLING OR PAIN  UNUSUAL VAGINAL DISCHARGE OR ITCHING   Items with * indicate a potential emergency and should be followed up as soon as possible or go to the Emergency Department if any problems should occur.  Please show the CHEMOTHERAPY ALERT CARD or IMMUNOTHERAPY ALERT CARD at check-in to  the Emergency Department and triage nurse.  Should you have questions after your visit or need to cancel or reschedule your appointment, please contact [facility]  Dept: [phone]  and follow the prompts.  Office hours are 8:00 a.m. to 4:30 p.m. Monday - Friday. Please note that voicemails left after 4:00 p.m. may not be returned until the following business day.  We are closed weekends and major holidays. You have access to a nurse at all times for urgent questions. Please call the main number to the clinic Dept: [phone] and follow the prompts.   For any non-urgent questions, you may also contact your provider using MyChart. We now offer e-Visits for anyone 5 and older to request care online for non-urgent symptoms. For details visit [email or web address].   Also download the MyChart app! Go to the app store, search "MyChart", open the app, select [facility], and log in with your MyChart username and password.  Masks are optional in the cancer centers. If you would like for your care team to wear a mask while they are taking care of you, please let them know. You may have one support person who is at least 63 years old accompany you for your appointments.   Cisplatin injection What is this medication? CISPLATIN (SIS pla tin) is a chemotherapy drug. It targets fast dividing cells, like cancer cells, and causes these cells to die. This medicine is used to treat many types of cancer like bladder, ovarian, and testicular cancers. This medicine may be used for other purposes; ask your health care provider or pharmacist if  you have questions. COMMON BRAND NAME(S): Platinol, Platinol -AQ What should I tell my care team before I take this medication? They need to know if you have any of these conditions: eye disease, vision problems hearing problems kidney disease low blood counts, like white cells, platelets, or red blood cells tingling of the fingers or  toes, or other nerve disorder an unusual or allergic reaction to cisplatin, carboplatin, oxaliplatin, other medicines, foods, dyes, or preservatives pregnant or trying to get pregnant breast-feeding How should I use this medication? This drug is given as an infusion into a vein. It is administered in a hospital or clinic by a specially trained health care professional. Talk to your pediatrician regarding the use of this medicine in children. Special care may be needed. Overdosage: If you think you have taken too much of this medicine contact a poison control center or emergency room at once. NOTE: This medicine is only for you. Do not share this medicine with others. What if I miss a dose? It is important not to miss a dose. Call your doctor or health care professional if you are unable to keep an appointment. What may interact with this medication? This medicine may interact with the following medications: foscarnet certain antibiotics like amikacin, gentamicin, neomycin, polymyxin B, streptomycin, tobramycin, vancomycin This list may not describe all possible interactions. Give your health care provider a list of all the medicines, herbs, non-prescription drugs, or dietary supplements you use. Also tell them if you smoke, drink alcohol, or use illegal drugs. Some items may interact with your medicine. What should I watch for while using this medication? Your condition will be monitored carefully while you are receiving this medicine. You will need important blood work done while you are taking this medicine. This drug may make you feel generally unwell. This is not uncommon, as chemotherapy can affect healthy cells as well as cancer cells. Report any side effects. Continue your course of treatment even though you feel ill unless your doctor tells you to stop. This medicine may increase your risk of getting an infection. Call your healthcare professional for advice if you get a fever, chills, or  sore throat, or other symptoms of a cold or flu. Do not treat yourself. Try to avoid being around people who are sick. Avoid taking medicines that contain aspirin, acetaminophen, ibuprofen, naproxen, or ketoprofen unless instructed by your healthcare professional. These medicines may hide a fever. This medicine may increase your risk to bruise or bleed. Call your doctor or health care professional if you notice any unusual bleeding. Be careful brushing and flossing your teeth or using a toothpick because you may get an infection or bleed more easily. If you have any dental work done, tell your dentist you are receiving this medicine. Do not become pregnant while taking this medicine or for 14 months after stopping it. Women should inform their healthcare professional if they wish to become pregnant or think they might be pregnant. Men should not father a child while taking this medicine and for 11 months after stopping it. There is potential for serious side effects to an unborn child. Talk to your healthcare professional for more information. Do not breast-feed an infant while taking this medicine. This medicine has caused ovarian failure in some women. This medicine may make it more difficult to get pregnant. Talk to your healthcare professional if you are concerned about your fertility. This medicine has caused decreased sperm counts in some men. This may make it more  difficult to father a child. Talk to your healthcare professional if you are concerned about your fertility. Drink fluids as directed while you are taking this medicine. This will help protect your kidneys. Call your doctor or health care professional if you get diarrhea. Do not treat yourself. What side effects may I notice from receiving this medication? Side effects that you should report to your doctor or health care professional as soon as possible: allergic reactions like skin rash, itching or hives, swelling of the face, lips, or  tongue blurred vision changes in vision decreased hearing or ringing of the ears nausea, vomiting pain, redness, or irritation at site where injected pain, tingling, numbness in the hands or feet signs and symptoms of bleeding such as bloody or black, tarry stools; red or dark brown urine; spitting up blood or brown material that looks like coffee grounds; red spots on the skin; unusual bruising or bleeding from the eyes, gums, or nose signs and symptoms of infection like fever; chills; cough; sore throat; pain or trouble passing urine signs and symptoms of kidney injury like trouble passing urine or change in the amount of urine signs and symptoms of low red blood cells or anemia such as unusually weak or tired; feeling faint or lightheaded; falls; breathing problems Side effects that usually do not require medical attention (report to your doctor or health care professional if they continue or are bothersome): loss of appetite mouth sores muscle cramps This list may not describe all possible side effects. Call your doctor for medical advice about side effects. You may report side effects to FDA at 1-800-FDA-1088. Where should I keep my medication? This drug is given in a hospital or clinic and will not be stored at home. NOTE: This sheet is a summary. It may not cover all possible information. If you have questions about this medicine, talk to your doctor, pharmacist, or health care provider.  2023 Elsevier/Gold Standard (2021-06-17 00:00:00)

## 2022-03-01 ENCOUNTER — Encounter: Payer: Self-pay | Admitting: Hematology and Oncology

## 2022-03-01 ENCOUNTER — Ambulatory Visit
Admission: RE | Admit: 2022-03-01 | Discharge: 2022-03-01 | Disposition: A | Discharge status: Home / Self Care | Payer: Medicaid Other | Source: Ambulatory Visit | Attending: Radiation Oncology | Admitting: Radiation Oncology

## 2022-03-01 ENCOUNTER — Other Ambulatory Visit: Payer: Self-pay

## 2022-03-01 DIAGNOSIS — Z5111 Encounter for antineoplastic chemotherapy: Secondary | ICD-10-CM | POA: Diagnosis not present

## 2022-03-01 LAB — RAD ONC ARIA SESSION SUMMARY
Course Elapsed Days: 16
Plan Fractions Treated to Date: 13
Plan Prescribed Dose Per Fraction: 2 Gy
Plan Total Fractions Prescribed: 35
Plan Total Prescribed Dose: 70 Gy
Reference Point Dosage Given to Date: 26 Gy
Reference Point Session Dosage Given: 2 Gy
Session Number: 13

## 2022-03-01 NOTE — Progress Notes (Signed)
Pt's ins should pay for his treatment at 100%.  I emailed Ailene Ravel and Vincente Liberty in the radiation dept requesting they reach out to the pt regarding the [employer].

## 2022-03-02 ENCOUNTER — Ambulatory Visit
Admission: RE | Admit: 2022-03-02 | Discharge: 2022-03-02 | Disposition: A | Discharge status: Home / Self Care | Payer: Medicaid Other | Source: Ambulatory Visit | Attending: Radiation Oncology | Admitting: Radiation Oncology

## 2022-03-02 ENCOUNTER — Other Ambulatory Visit: Payer: Self-pay

## 2022-03-02 DIAGNOSIS — Z5111 Encounter for antineoplastic chemotherapy: Secondary | ICD-10-CM | POA: Diagnosis not present

## 2022-03-02 LAB — RAD ONC ARIA SESSION SUMMARY
Course Elapsed Days: 17
Plan Fractions Treated to Date: 14
Plan Prescribed Dose Per Fraction: 2 Gy
Plan Total Fractions Prescribed: 35
Plan Total Prescribed Dose: 70 Gy
Reference Point Dosage Given to Date: 28 Gy
Reference Point Session Dosage Given: 2 Gy
Session Number: 14

## 2022-03-03 ENCOUNTER — Other Ambulatory Visit: Payer: Self-pay

## 2022-03-03 ENCOUNTER — Encounter: Payer: Self-pay | Admitting: Hematology and Oncology

## 2022-03-03 ENCOUNTER — Encounter: Payer: Self-pay | Admitting: Hematology

## 2022-03-03 ENCOUNTER — Ambulatory Visit
Admission: RE | Admit: 2022-03-03 | Discharge: 2022-03-03 | Disposition: A | Discharge status: Home / Self Care | Payer: Medicaid Other | Source: Ambulatory Visit | Attending: Radiation Oncology | Admitting: Radiation Oncology

## 2022-03-03 ENCOUNTER — Other Ambulatory Visit: Payer: Self-pay | Admitting: *Deleted

## 2022-03-03 ENCOUNTER — Telehealth: Payer: Self-pay | Admitting: *Deleted

## 2022-03-03 DIAGNOSIS — Z5111 Encounter for antineoplastic chemotherapy: Secondary | ICD-10-CM | POA: Diagnosis not present

## 2022-03-03 DIAGNOSIS — C155 Malignant neoplasm of lower third of esophagus: Secondary | ICD-10-CM

## 2022-03-03 LAB — RAD ONC ARIA SESSION SUMMARY
Course Elapsed Days: 18
Plan Fractions Treated to Date: 15
Plan Prescribed Dose Per Fraction: 2 Gy
Plan Total Fractions Prescribed: 35
Plan Total Prescribed Dose: 70 Gy
Reference Point Dosage Given to Date: 30 Gy
Reference Point Session Dosage Given: 2 Gy
Session Number: 15

## 2022-03-03 NOTE — Telephone Encounter (Addendum)
This RN contacted [facility] - requested a consult with Dr Catalina Antigua for consult for probable J tube placement- message left on VM for Mya - intake scheduler for Dr Catalina Antigua. This RN's name and return call number given. Amb ref placed though noted as incomplete due to needed demographics not available in system.  Will await return call.  ----- Message from Benay Pike, MD sent at 03/02/2022  5:11 PM EDT ----- Yes we will send a referral. Adding val to the conversation ----- Message ----- From: Eppie Gibson, MD Sent: 03/01/2022   4:55 PM EDT To: Gery Pray, MD; Zola Button, RN; #  Hi weight is stable so far... I think there is a ~60% chance he will need one - he is pretty tough.  Can you refer him to a surgeon for a consultation but ask them to hold the procedure until about 3 weeks from now? By then I think we'll have a good idea of whether it's necessary or not. I am ccing Kinard and Katelin, as I'll be out next week. If either of them get feedback from the pt that he is struggling with PO, they can also let you know. Sarah  ----- Message ----- From: Benay Pike, MD Sent: 03/01/2022   4:12 PM EDT To: Eppie Gibson, MD  Elmer Bales you think Mr Laswell will need J tube?  Thanks ----- Message ----- From: [name], MD Sent: 02/28/2022  11:38 AM EDT To: Benay Pike, MD  Any surgeon can do it, depends on how urgent it needs. You can just call the on-call surgeon if it's urgent, or urgent office referral if does not have to be done this week. Any breast surgeon can do that too.   Krista Blue  ----- Message ----- From: Benay Pike, MD Sent: 02/28/2022   9:39 AM EDT To: [name], MD  Dr Burr Medico  Who is the surgeon of choice if Mr Kroon needs a J tube?  Thanks,

## 2022-03-06 ENCOUNTER — Inpatient Hospital Stay: Payer: Medicaid Other

## 2022-03-06 ENCOUNTER — Other Ambulatory Visit: Payer: Self-pay

## 2022-03-06 ENCOUNTER — Encounter: Payer: Self-pay | Admitting: Hematology

## 2022-03-06 ENCOUNTER — Encounter: Payer: Self-pay | Admitting: Hematology and Oncology

## 2022-03-06 ENCOUNTER — Encounter: Payer: Self-pay | Admitting: Adult Health

## 2022-03-06 ENCOUNTER — Inpatient Hospital Stay (HOSPITAL_BASED_OUTPATIENT_CLINIC_OR_DEPARTMENT_OTHER): Payer: Medicaid Other | Admitting: Adult Health

## 2022-03-06 ENCOUNTER — Other Ambulatory Visit (HOSPITAL_COMMUNITY): Payer: Self-pay

## 2022-03-06 ENCOUNTER — Ambulatory Visit
Admission: RE | Admit: 2022-03-06 | Discharge: 2022-03-06 | Disposition: A | Discharge status: Home / Self Care | Payer: Medicaid Other | Source: Ambulatory Visit | Attending: Radiation Oncology | Admitting: Radiation Oncology

## 2022-03-06 VITALS — BP 126/73 | HR 91 | Temp 97.9°F | Resp 16 | Ht 59.0 in | Wt 121.3 lb

## 2022-03-06 DIAGNOSIS — C01 Malignant neoplasm of base of tongue: Secondary | ICD-10-CM | POA: Diagnosis not present

## 2022-03-06 DIAGNOSIS — C155 Malignant neoplasm of lower third of esophagus: Secondary | ICD-10-CM

## 2022-03-06 DIAGNOSIS — Z5111 Encounter for antineoplastic chemotherapy: Secondary | ICD-10-CM | POA: Diagnosis not present

## 2022-03-06 DIAGNOSIS — Z95828 Presence of other vascular implants and grafts: Secondary | ICD-10-CM

## 2022-03-06 LAB — BASIC METABOLIC PANEL - CANCER CENTER ONLY
Anion gap: 6 (ref 5–15)
BUN: 16 mg/dL (ref 8–23)
CO2: 29 mmol/L (ref 22–32)
Calcium: 8.7 mg/dL — ABNORMAL LOW (ref 8.9–10.3)
Chloride: 100 mmol/L (ref 98–111)
Creatinine: 0.61 mg/dL (ref 0.61–1.24)
GFR, Estimated: >60 mL/min (ref >60)
Glucose, Bld: 102 mg/dL — ABNORMAL HIGH (ref 70–99)
Potassium: 3.9 mmol/L (ref 3.5–5.1)
Sodium: 135 mmol/L (ref 135–145)

## 2022-03-06 LAB — RAD ONC ARIA SESSION SUMMARY
Course Elapsed Days: 21
Plan Fractions Treated to Date: 16
Plan Prescribed Dose Per Fraction: 2 Gy
Plan Total Fractions Prescribed: 35
Plan Total Prescribed Dose: 70 Gy
Reference Point Dosage Given to Date: 32 Gy
Reference Point Session Dosage Given: 2 Gy
Session Number: 16

## 2022-03-06 LAB — MAGNESIUM: Magnesium: 2 mg/dL (ref 1.7–2.4)

## 2022-03-06 LAB — CBC WITH DIFFERENTIAL (CANCER CENTER ONLY)
Abs Immature Granulocytes: 0.03 10*3/uL (ref 0.00–0.07)
Basophils Absolute: 0 10*3/uL (ref 0.0–0.1)
Basophils Relative: 0 %
Eosinophils Absolute: 0.2 10*3/uL (ref 0.0–0.5)
Eosinophils Relative: 3 %
HCT: 36.7 % — ABNORMAL LOW (ref 39.0–52.0)
Hemoglobin: 13 g/dL (ref 13.0–17.0)
Immature Granulocytes: 1 %
Lymphocytes Relative: 5 %
Lymphs Abs: 0.3 10*3/uL — ABNORMAL LOW (ref 0.7–4.0)
MCH: 31.3 pg (ref 26.0–34.0)
MCHC: 35.4 g/dL (ref 30.0–36.0)
MCV: 88.4 fL (ref 80.0–100.0)
Monocytes Absolute: 0.7 10*3/uL (ref 0.1–1.0)
Monocytes Relative: 11 %
Neutro Abs: 5.3 10*3/uL (ref 1.7–7.7)
Neutrophils Relative %: 80 %
Platelet Count: 195 10*3/uL (ref 150–400)
RBC: 4.15 MIL/uL — ABNORMAL LOW (ref 4.22–5.81)
RDW: 13 % (ref 11.5–15.5)
WBC Count: 6.5 10*3/uL (ref 4.0–10.5)
nRBC: 0.3 % — ABNORMAL HIGH (ref 0.0–0.2)

## 2022-03-06 MED ORDER — FLUCONAZOLE 100 MG PO TABS
100.0000 mg | ORAL_TABLET | Freq: Every day | ORAL | 0 refills | Status: DC
  Filled 2022-03-06: qty 30, 30d supply, fill #0

## 2022-03-06 MED ORDER — NYSTATIN 100000 UNIT/ML MT SUSP
OROMUCOSAL | 1 refills | Status: DC
  Filled 2022-03-06: qty 120, 6d supply, fill #0
  Filled 2022-04-04: qty 120, 6d supply, fill #1

## 2022-03-06 MED ORDER — LEVETIRACETAM 100 MG/ML PO SOLN
500.0000 mg | Freq: Two times a day (BID) | ORAL | 2 refills | Status: DC
  Filled 2022-03-06: qty 473, 47d supply, fill #0
  Filled 2022-04-04: qty 473, 47d supply, fill #1

## 2022-03-06 MED ORDER — HEPARIN SOD (PORK) LOCK FLUSH 100 UNIT/ML IV SOLN
500.0000 [IU] | Freq: Once | INTRAVENOUS | Status: AC
  Administered 2022-03-06: 500 [IU]

## 2022-03-06 MED ORDER — SODIUM CHLORIDE 0.9% FLUSH
10.0000 mL | Freq: Once | INTRAVENOUS | Status: AC
  Administered 2022-03-06: 10 mL

## 2022-03-06 MED FILL — Dexamethasone Sodium Phosphate Inj 100 MG/10ML: INTRAMUSCULAR | Qty: 1 | Status: AC

## 2022-03-06 NOTE — Assessment & Plan Note (Signed)
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  He is scheduled to receive his second weekly dose of cisplatin tomorrow and he will proceed with this therapy.  We reviewed his labs which are stable.  He is experiencing some side effects of treatment therefore we will do the following:  1.  Constipation I recommended that he take Senokot as 1-2 times as much is twice a day in addition to MiraLAX daily. 2.  He does have thrush and mucositis on exam I placed orders for Magic mouthwash and fluconazole for him to take. 3.  The Keppra tablet is a little bit larger and he has noted some mild discomfort with swallowing this therefore I placed an order for him to get the liquid form of this which is in stock at the [facility]. 4.  His weight is stable and we talked with our nutritionist who will follow-up with him tomorrow about giving him some more Ensure.  He will return next week for labs, follow-up, and his next treatment.  He will continue with his daily radiation.

## 2022-03-06 NOTE — Progress Notes (Signed)
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 (communications with other health care  professionals or caregivers) and documentation in the medical record.

## 2022-03-06 NOTE — Patient Instructions (Signed)
For your constipation I recommend that you take the following Senokot-S 1-2 tablets twice a day MiraLAX per bottle directions daily  Call us if you are still having difficulty after doing the above

## 2022-03-07 ENCOUNTER — Inpatient Hospital Stay: Payer: Medicaid Other | Admitting: Dietician

## 2022-03-07 ENCOUNTER — Inpatient Hospital Stay: Payer: Medicaid Other

## 2022-03-07 ENCOUNTER — Ambulatory Visit: Payer: Medicaid Other

## 2022-03-07 ENCOUNTER — Other Ambulatory Visit: Payer: Self-pay

## 2022-03-07 ENCOUNTER — Other Ambulatory Visit: Payer: Self-pay | Admitting: Adult Health

## 2022-03-07 ENCOUNTER — Ambulatory Visit
Admission: RE | Admit: 2022-03-07 | Discharge: 2022-03-07 | Disposition: A | Discharge status: Home / Self Care | Payer: Medicaid Other | Source: Ambulatory Visit | Attending: Radiation Oncology | Admitting: Radiation Oncology

## 2022-03-07 VITALS — BP 113/68 | HR 90 | Temp 97.9°F | Resp 16 | Wt 120.8 lb

## 2022-03-07 DIAGNOSIS — C155 Malignant neoplasm of lower third of esophagus: Secondary | ICD-10-CM

## 2022-03-07 DIAGNOSIS — C01 Malignant neoplasm of base of tongue: Secondary | ICD-10-CM

## 2022-03-07 DIAGNOSIS — Z5111 Encounter for antineoplastic chemotherapy: Secondary | ICD-10-CM | POA: Diagnosis not present

## 2022-03-07 LAB — RAD ONC ARIA SESSION SUMMARY
Course Elapsed Days: 22
Plan Fractions Treated to Date: 17
Plan Prescribed Dose Per Fraction: 2 Gy
Plan Total Fractions Prescribed: 35
Plan Total Prescribed Dose: 70 Gy
Reference Point Dosage Given to Date: 34 Gy
Reference Point Session Dosage Given: 2 Gy
Session Number: 17

## 2022-03-07 MED ORDER — SENNOSIDES-DOCUSATE SODIUM 8.6-50 MG PO TABS: 2.0000 | ORAL_TABLET | Freq: Two times a day (BID) | ORAL | 0 refills | Status: DC

## 2022-03-07 MED ORDER — HEPARIN SOD (PORK) LOCK FLUSH 100 UNIT/ML IV SOLN
500.0000 [IU] | Freq: Once | INTRAVENOUS | Status: AC | PRN
  Administered 2022-03-07: 500 [IU]

## 2022-03-07 MED ORDER — SODIUM CHLORIDE 0.9 % IV SOLN
40.0000 mg/m2 | Freq: Once | INTRAVENOUS | Status: AC
  Administered 2022-03-07: 61 mg via INTRAVENOUS
  Filled 2022-03-07: qty 61

## 2022-03-07 MED ORDER — SODIUM CHLORIDE 0.9 % IV SOLN
150.0000 mg | Freq: Once | INTRAVENOUS | Status: AC
  Administered 2022-03-07: 150 mg via INTRAVENOUS
  Filled 2022-03-07: qty 5

## 2022-03-07 MED ORDER — SODIUM CHLORIDE 0.9 % IV SOLN: Freq: Once | INTRAVENOUS | Status: AC

## 2022-03-07 MED ORDER — SODIUM CHLORIDE 0.9 % IV SOLN
10.0000 mg | Freq: Once | INTRAVENOUS | Status: AC
  Administered 2022-03-07: 10 mg via INTRAVENOUS
  Filled 2022-03-07: qty 10

## 2022-03-07 MED ORDER — PALONOSETRON HCL INJECTION 0.25 MG/5ML
0.2500 mg | Freq: Once | INTRAVENOUS | Status: AC
  Administered 2022-03-07: 0.25 mg via INTRAVENOUS
  Filled 2022-03-07: qty 5

## 2022-03-07 MED ORDER — POTASSIUM CHLORIDE IN NACL 20-0.9 MEQ/L-% IV SOLN
Freq: Once | INTRAVENOUS | Status: AC
  Filled 2022-03-07: qty 1000

## 2022-03-07 MED ORDER — MAGNESIUM SULFATE 2 GM/50ML IV SOLN
2.0000 g | Freq: Once | INTRAVENOUS | Status: AC
  Administered 2022-03-07: 2 g via INTRAVENOUS
  Filled 2022-03-07: qty 50

## 2022-03-07 MED ORDER — SODIUM CHLORIDE 0.9% FLUSH
10.0000 mL | INTRAVENOUS | Status: DC | PRN
  Administered 2022-03-07: 10 mL

## 2022-03-07 NOTE — Progress Notes (Signed)
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 S/p esophagectomy in 2018 for esophageal cancer. Patient does not have a feeding tube.   Met with patient in infusion. He reports decreased appetite. His mouth/throat are sore. Patient is doing baking soda salt water rinses several times daily. He started magic mouthwash for thrush. He endorses some pain with swallow. He denies nausea or vomiting. Patient is constipated, says his last bowel movement was 6 days ago. He took one dose of Miralax late yesterday afternoon. Patient reports not picking up senna-s as this is too expensive over the counter. MD to order as medication. Patient reports ~20 ounces of water daily. He is drinking one Ensure. Yesterday patient ate mashed potato/gravy, beanie weenies, tuna salad, bowl of ice cream.    Medications: reviewed   Labs: glucose   Anthropometrics: Weight today 121.2 lb (per [facility]) decreased 2.7% (3.4 lbs) in 7 days. This is severe  7/31 - 124.6 lb 7/5 - 123 lb   Estimated Energy Needs  Kcals: 1540-0867 Protein: 82-99 Fluid: > 1.6 L  NUTRITION DIAGNOSIS: Predicted suboptimal intake has evolved into inadequate oral intake related to cancer and associated treatment side effects as evidenced by sore throat/painful swallow, oral thrush, constipation, ~3% wt loss in one week which is severe for time frame.    INTERVENTION:  Recommend placement of J-tube - surgery consult currently scheduled 8/24 Reviewed soft moist foods high in calories and protein - pt has handout + shake recipes Continue bowel regimen per MD Continue baking soda salt water rinses several times daily Continue magic mouth wash for oral thrush - educated pt to get new toothbrush once infection clears Recommend increasing Ensure Plus - 3x/day One complimentary case Ensure Plus HP provided today   MONITORING, EVALUATION, GOAL: weight trends, intake   NEXT  VISIT: Monday August 14 with Pamala Hurry (pt aware)

## 2022-03-07 NOTE — Progress Notes (Signed)
Ok to treat today -per Dr. Chryl Heck- with no BM since 03/02/22. Prescription to be sent for something to aid GI progress. Patient does have bowel sounds per auscultation and states he has been passing flatus.

## 2022-03-07 NOTE — Patient Instructions (Signed)
[facility]  Discharge Instructions: Thank you for choosing [facility] to provide your oncology and hematology care.   If you have a lab appointment with the [facility], please go directly to the [facility] and check in at the registration area.   Wear comfortable clothing and clothing appropriate for easy access to any Portacath or PICC line.   We strive to give you quality time with your provider. You may need to reschedule your appointment if you arrive late (15 or more minutes).  Arriving late affects you and other patients whose appointments are after yours.  Also, if you miss three or more appointments without notifying the office, you may be dismissed from the clinic at the provider's discretion.      For prescription refill requests, have your pharmacy contact our office and allow 72 hours for refills to be completed.    Today you received the following chemotherapy and/or immunotherapy agents: Cisplatin      To help prevent nausea and vomiting after your treatment, we encourage you to take your nausea medication as directed.  BELOW ARE SYMPTOMS THAT SHOULD BE REPORTED IMMEDIATELY: *FEVER GREATER THAN 100.4 F (38 C) OR HIGHER *CHILLS OR SWEATING *NAUSEA AND VOMITING THAT IS NOT CONTROLLED WITH YOUR NAUSEA MEDICATION *UNUSUAL SHORTNESS OF BREATH *UNUSUAL BRUISING OR BLEEDING *URINARY PROBLEMS (pain or burning when urinating, or frequent urination) *BOWEL PROBLEMS (unusual diarrhea, constipation, pain near the anus) TENDERNESS IN MOUTH AND THROAT WITH OR WITHOUT PRESENCE OF ULCERS (sore throat, sores in mouth, or a toothache) UNUSUAL RASH, SWELLING OR PAIN  UNUSUAL VAGINAL DISCHARGE OR ITCHING   Items with * indicate a potential emergency and should be followed up as soon as possible or go to the Emergency Department if any problems should occur.  Please show the CHEMOTHERAPY ALERT CARD or IMMUNOTHERAPY ALERT CARD at check-in to  the Emergency Department and triage nurse.  Should you have questions after your visit or need to cancel or reschedule your appointment, please contact [facility]  Dept: [phone]  and follow the prompts.  Office hours are 8:00 a.m. to 4:30 p.m. Monday - Friday. Please note that voicemails left after 4:00 p.m. may not be returned until the following business day.  We are closed weekends and major holidays. You have access to a nurse at all times for urgent questions. Please call the main number to the clinic Dept: [phone] and follow the prompts.   For any non-urgent questions, you may also contact your provider using MyChart. We now offer e-Visits for anyone 14 and older to request care online for non-urgent symptoms. For details visit [email or web address].   Also download the MyChart app! Go to the app store, search "MyChart", open the app, select , and log in with your MyChart username and password.  Masks are optional in the cancer centers. If you would like for your care team to wear a mask while they are taking care of you, please let them know. You may have one support person who is at least 64 years old accompany you for your appointments.   Cisplatin injection What is this medication? CISPLATIN (SIS pla tin) is a chemotherapy drug. It targets fast dividing cells, like cancer cells, and causes these cells to die. This medicine is used to treat many types of cancer like bladder, ovarian, and testicular cancers. This medicine may be used for other purposes; ask your health care provider or pharmacist if  you have questions. COMMON BRAND NAME(S): Platinol, Platinol -AQ What should I tell my care team before I take this medication? They need to know if you have any of these conditions: eye disease, vision problems hearing problems kidney disease low blood counts, like white cells, platelets, or red blood cells tingling of the fingers or  toes, or other nerve disorder an unusual or allergic reaction to cisplatin, carboplatin, oxaliplatin, other medicines, foods, dyes, or preservatives pregnant or trying to get pregnant breast-feeding How should I use this medication? This drug is given as an infusion into a vein. It is administered in a hospital or clinic by a specially trained health care professional. Talk to your pediatrician regarding the use of this medicine in children. Special care may be needed. Overdosage: If you think you have taken too much of this medicine contact a poison control center or emergency room at once. NOTE: This medicine is only for you. Do not share this medicine with others. What if I miss a dose? It is important not to miss a dose. Call your doctor or health care professional if you are unable to keep an appointment. What may interact with this medication? This medicine may interact with the following medications: foscarnet certain antibiotics like amikacin, gentamicin, neomycin, polymyxin B, streptomycin, tobramycin, vancomycin This list may not describe all possible interactions. Give your health care provider a list of all the medicines, herbs, non-prescription drugs, or dietary supplements you use. Also tell them if you smoke, drink alcohol, or use illegal drugs. Some items may interact with your medicine. What should I watch for while using this medication? Your condition will be monitored carefully while you are receiving this medicine. You will need important blood work done while you are taking this medicine. This drug may make you feel generally unwell. This is not uncommon, as chemotherapy can affect healthy cells as well as cancer cells. Report any side effects. Continue your course of treatment even though you feel ill unless your doctor tells you to stop. This medicine may increase your risk of getting an infection. Call your healthcare professional for advice if you get a fever, chills, or  sore throat, or other symptoms of a cold or flu. Do not treat yourself. Try to avoid being around people who are sick. Avoid taking medicines that contain aspirin, acetaminophen, ibuprofen, naproxen, or ketoprofen unless instructed by your healthcare professional. These medicines may hide a fever. This medicine may increase your risk to bruise or bleed. Call your doctor or health care professional if you notice any unusual bleeding. Be careful brushing and flossing your teeth or using a toothpick because you may get an infection or bleed more easily. If you have any dental work done, tell your dentist you are receiving this medicine. Do not become pregnant while taking this medicine or for 14 months after stopping it. Women should inform their healthcare professional if they wish to become pregnant or think they might be pregnant. Men should not father a child while taking this medicine and for 11 months after stopping it. There is potential for serious side effects to an unborn child. Talk to your healthcare professional for more information. Do not breast-feed an infant while taking this medicine. This medicine has caused ovarian failure in some women. This medicine may make it more difficult to get pregnant. Talk to your healthcare professional if you are concerned about your fertility. This medicine has caused decreased sperm counts in some men. This may make it more  difficult to father a child. Talk to your healthcare professional if you are concerned about your fertility. Drink fluids as directed while you are taking this medicine. This will help protect your kidneys. Call your doctor or health care professional if you get diarrhea. Do not treat yourself. What side effects may I notice from receiving this medication? Side effects that you should report to your doctor or health care professional as soon as possible: allergic reactions like skin rash, itching or hives, swelling of the face, lips, or  tongue blurred vision changes in vision decreased hearing or ringing of the ears nausea, vomiting pain, redness, or irritation at site where injected pain, tingling, numbness in the hands or feet signs and symptoms of bleeding such as bloody or black, tarry stools; red or dark brown urine; spitting up blood or brown material that looks like coffee grounds; red spots on the skin; unusual bruising or bleeding from the eyes, gums, or nose signs and symptoms of infection like fever; chills; cough; sore throat; pain or trouble passing urine signs and symptoms of kidney injury like trouble passing urine or change in the amount of urine signs and symptoms of low red blood cells or anemia such as unusually weak or tired; feeling faint or lightheaded; falls; breathing problems Side effects that usually do not require medical attention (report to your doctor or health care professional if they continue or are bothersome): loss of appetite mouth sores muscle cramps This list may not describe all possible side effects. Call your doctor for medical advice about side effects. You may report side effects to FDA at 1-800-FDA-1088. Where should I keep my medication? This drug is given in a hospital or clinic and will not be stored at home. NOTE: This sheet is a summary. It may not cover all possible information. If you have questions about this medicine, talk to your doctor, pharmacist, or health care provider.  2023 Elsevier/Gold Standard (2021-06-17 00:00:00)

## 2022-03-08 ENCOUNTER — Ambulatory Visit
Admission: RE | Admit: 2022-03-08 | Discharge: 2022-03-08 | Disposition: A | Discharge status: Home / Self Care | Payer: Medicaid Other | Source: Ambulatory Visit | Attending: Radiation Oncology | Admitting: Radiation Oncology

## 2022-03-08 ENCOUNTER — Other Ambulatory Visit: Payer: Self-pay | Admitting: *Deleted

## 2022-03-08 ENCOUNTER — Other Ambulatory Visit: Payer: Self-pay

## 2022-03-08 DIAGNOSIS — Z5111 Encounter for antineoplastic chemotherapy: Secondary | ICD-10-CM | POA: Diagnosis not present

## 2022-03-08 DIAGNOSIS — C155 Malignant neoplasm of lower third of esophagus: Secondary | ICD-10-CM

## 2022-03-08 DIAGNOSIS — C01 Malignant neoplasm of base of tongue: Secondary | ICD-10-CM

## 2022-03-08 LAB — RAD ONC ARIA SESSION SUMMARY
Course Elapsed Days: 23
Plan Fractions Treated to Date: 18
Plan Prescribed Dose Per Fraction: 2 Gy
Plan Total Fractions Prescribed: 35
Plan Total Prescribed Dose: 70 Gy
Reference Point Dosage Given to Date: 36 Gy
Reference Point Session Dosage Given: 2 Gy
Session Number: 18

## 2022-03-08 MED ORDER — DEXAMETHASONE 4 MG PO TABS: ORAL_TABLET | ORAL | 1 refills | Status: DC

## 2022-03-08 NOTE — Telephone Encounter (Signed)
This RN spoke with pt per clarification of use of dexamethasone with instructions to use as 2 tabs ('8mg'$ ) daily for 3 days post each cisplatin therapy.  MAR corrected to above.

## 2022-03-09 ENCOUNTER — Ambulatory Visit
Admission: RE | Admit: 2022-03-09 | Discharge: 2022-03-09 | Disposition: A | Discharge status: Home / Self Care | Payer: Medicaid Other | Source: Ambulatory Visit | Attending: Radiation Oncology | Admitting: Radiation Oncology

## 2022-03-09 ENCOUNTER — Other Ambulatory Visit: Payer: Self-pay

## 2022-03-09 DIAGNOSIS — Z5111 Encounter for antineoplastic chemotherapy: Secondary | ICD-10-CM | POA: Diagnosis not present

## 2022-03-09 LAB — RAD ONC ARIA SESSION SUMMARY
Course Elapsed Days: 24
Plan Fractions Treated to Date: 19
Plan Prescribed Dose Per Fraction: 2 Gy
Plan Total Fractions Prescribed: 35
Plan Total Prescribed Dose: 70 Gy
Reference Point Dosage Given to Date: 38 Gy
Reference Point Session Dosage Given: 2 Gy
Session Number: 19

## 2022-03-10 ENCOUNTER — Other Ambulatory Visit: Payer: Self-pay

## 2022-03-10 ENCOUNTER — Ambulatory Visit
Admission: RE | Admit: 2022-03-10 | Discharge: 2022-03-10 | Disposition: A | Discharge status: Home / Self Care | Payer: Medicaid Other | Source: Ambulatory Visit | Attending: Radiation Oncology | Admitting: Radiation Oncology

## 2022-03-10 DIAGNOSIS — Z5111 Encounter for antineoplastic chemotherapy: Secondary | ICD-10-CM | POA: Diagnosis not present

## 2022-03-10 LAB — RAD ONC ARIA SESSION SUMMARY
Course Elapsed Days: 25
Plan Fractions Treated to Date: 20
Plan Prescribed Dose Per Fraction: 2 Gy
Plan Total Fractions Prescribed: 35
Plan Total Prescribed Dose: 70 Gy
Reference Point Dosage Given to Date: 40 Gy
Reference Point Session Dosage Given: 2 Gy
Session Number: 20

## 2022-03-13 ENCOUNTER — Inpatient Hospital Stay: Payer: Medicaid Other | Admitting: Nutrition

## 2022-03-13 ENCOUNTER — Inpatient Hospital Stay: Payer: Medicaid Other

## 2022-03-13 ENCOUNTER — Encounter: Payer: Self-pay | Admitting: Hematology and Oncology

## 2022-03-13 ENCOUNTER — Other Ambulatory Visit: Payer: Self-pay

## 2022-03-13 ENCOUNTER — Ambulatory Visit: Payer: Medicaid Other

## 2022-03-13 ENCOUNTER — Ambulatory Visit
Admission: RE | Admit: 2022-03-13 | Discharge: 2022-03-13 | Disposition: A | Discharge status: Home / Self Care | Payer: Medicaid Other | Source: Ambulatory Visit | Attending: Radiation Oncology | Admitting: Radiation Oncology

## 2022-03-13 ENCOUNTER — Other Ambulatory Visit: Payer: Self-pay | Admitting: Family Medicine

## 2022-03-13 ENCOUNTER — Inpatient Hospital Stay: Payer: Medicaid Other | Admitting: Hematology and Oncology

## 2022-03-13 DIAGNOSIS — Z5111 Encounter for antineoplastic chemotherapy: Secondary | ICD-10-CM | POA: Diagnosis not present

## 2022-03-13 DIAGNOSIS — K1231 Oral mucositis (ulcerative) due to antineoplastic therapy: Secondary | ICD-10-CM | POA: Insufficient documentation

## 2022-03-13 DIAGNOSIS — E7800 Pure hypercholesterolemia, unspecified: Secondary | ICD-10-CM

## 2022-03-13 DIAGNOSIS — C01 Malignant neoplasm of base of tongue: Secondary | ICD-10-CM | POA: Diagnosis not present

## 2022-03-13 DIAGNOSIS — C155 Malignant neoplasm of lower third of esophagus: Secondary | ICD-10-CM

## 2022-03-13 DIAGNOSIS — Z95828 Presence of other vascular implants and grafts: Secondary | ICD-10-CM

## 2022-03-13 DIAGNOSIS — R634 Abnormal weight loss: Secondary | ICD-10-CM | POA: Diagnosis not present

## 2022-03-13 LAB — RAD ONC ARIA SESSION SUMMARY
Course Elapsed Days: 28
Plan Fractions Treated to Date: 21
Plan Prescribed Dose Per Fraction: 2 Gy
Plan Total Fractions Prescribed: 35
Plan Total Prescribed Dose: 70 Gy
Reference Point Dosage Given to Date: 42 Gy
Reference Point Session Dosage Given: 2 Gy
Session Number: 21

## 2022-03-13 LAB — CBC WITH DIFFERENTIAL (CANCER CENTER ONLY)
Abs Immature Granulocytes: 0.03 10*3/uL (ref 0.00–0.07)
Basophils Absolute: 0 10*3/uL (ref 0.0–0.1)
Basophils Relative: 0 %
Eosinophils Absolute: 0.1 10*3/uL (ref 0.0–0.5)
Eosinophils Relative: 1 %
HCT: 39.5 % (ref 39.0–52.0)
Hemoglobin: 14.2 g/dL (ref 13.0–17.0)
Immature Granulocytes: 0 %
Lymphocytes Relative: 3 %
Lymphs Abs: 0.3 10*3/uL — ABNORMAL LOW (ref 0.7–4.0)
MCH: 31.6 pg (ref 26.0–34.0)
MCHC: 35.9 g/dL (ref 30.0–36.0)
MCV: 87.8 fL (ref 80.0–100.0)
Monocytes Absolute: 0.9 10*3/uL (ref 0.1–1.0)
Monocytes Relative: 8 %
Neutro Abs: 9.3 10*3/uL — ABNORMAL HIGH (ref 1.7–7.7)
Neutrophils Relative %: 88 %
Platelet Count: 192 10*3/uL (ref 150–400)
RBC: 4.5 MIL/uL (ref 4.22–5.81)
RDW: 13.2 % (ref 11.5–15.5)
WBC Count: 10.6 10*3/uL — ABNORMAL HIGH (ref 4.0–10.5)
nRBC: 0 % (ref 0.0–0.2)

## 2022-03-13 LAB — CMP (CANCER CENTER ONLY)
ALT: 26 U/L (ref 0–44)
AST: 18 U/L (ref 15–41)
Albumin: 3.9 g/dL (ref 3.5–5.0)
Alkaline Phosphatase: 87 U/L (ref 38–126)
Anion gap: 4 — ABNORMAL LOW (ref 5–15)
BUN: 19 mg/dL (ref 8–23)
CO2: 29 mmol/L (ref 22–32)
Calcium: 8.8 mg/dL — ABNORMAL LOW (ref 8.9–10.3)
Chloride: 98 mmol/L (ref 98–111)
Creatinine: 0.66 mg/dL (ref 0.61–1.24)
GFR, Estimated: >60 mL/min (ref >60)
Glucose, Bld: 103 mg/dL — ABNORMAL HIGH (ref 70–99)
Potassium: 3.8 mmol/L (ref 3.5–5.1)
Sodium: 131 mmol/L — ABNORMAL LOW (ref 135–145)
Total Bilirubin: 0.6 mg/dL (ref 0.3–1.2)
Total Protein: 6.8 g/dL (ref 6.5–8.1)

## 2022-03-13 LAB — MAGNESIUM: Magnesium: 1.9 mg/dL (ref 1.7–2.4)

## 2022-03-13 LAB — CEA (IN HOUSE-CHCC): CEA (CHCC-In House): 1.4 ng/mL (ref 0.00–5.00)

## 2022-03-13 MED ORDER — HEPARIN SOD (PORK) LOCK FLUSH 100 UNIT/ML IV SOLN
500.0000 [IU] | Freq: Once | INTRAVENOUS | Status: AC
  Administered 2022-03-13: 500 [IU]

## 2022-03-13 MED ORDER — SODIUM CHLORIDE 0.9% FLUSH
10.0000 mL | Freq: Once | INTRAVENOUS | Status: AC
  Administered 2022-03-13: 10 mL

## 2022-03-13 MED FILL — Dexamethasone Sodium Phosphate Inj 100 MG/10ML: INTRAMUSCULAR | Qty: 1 | Status: AC

## 2022-03-13 MED FILL — Fosaprepitant Dimeglumine For IV Infusion 150 MG (Base Eq): INTRAVENOUS | Qty: 5 | Status: AC

## 2022-03-13 NOTE — Assessment & Plan Note (Addendum)
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  He is scheduled to receive his cycle 3-day 1 of cisplatin tomorrow and he is here for follow-up. No concerning review of system findings today.  Physical examination with remarkable improvement in left cervical lymphadenopathy consistent with response.  He does have some evidence of mucositis but he is still able to eat and swallow.  He is working on increasing his fluid intake.  Currently is scheduled with surgery consults at the end of August 24 for future J-tube needs.  He is able to drink boost about twice a day. He has lost 3 pounds since his last visit.  We have recommended continuing at least 1500-calorie intake to maintain weight.  He expressed understanding. No other dose-limiting toxicity noted from cisplatin.  Okay to proceed with treatment as planned if labs are all within parameters.

## 2022-03-13 NOTE — Progress Notes (Signed)
Nutrition follow-up completed with patient and daughter after treatment for left cervical lymph node cancer.  He is status post esophagectomy in 2018 for esophageal cancer.  Patient reports he is dehydrated and has not been eating enough. He continues to have difficulty swallowing but does better with liquids.  Reports he is very cautious with swallowing. States he is only drinking Ensure plus high-protein 1-2 cartons daily. Yesterday he had a bowl of soup.  Today he had eggs.  Other than this he is not eating enough.  He also does not drink enough fluids. He denies nausea. He does report constipation but is hesitant to take laxatives for fear he will not be able to make it to the bathroom.  Weight documented on August 14 was 116 pounds 6 ounces this is decreased from 121.2 ounces August 8.  Severe weight loss continues.  Estimated nutrition needs:  1644-1918 cal, 82-99 g protein, greater than 1.6 L fluid.  Nutrition diagnosis: Inadequate oral intake continues.  Intervention: Patient educated on importance of increasing liquid supplements and consider adding blenderized foods. Increase Ensure Plus high-protein to minimum of 4 cartons daily. Provided samples of boost very high-calorie and encouraged minimum of 3 cartons daily. Reviewed specific foods for patient to add at mealtimes. Patient has refused moving J-tube placement up before scheduled procedure on August 24.  Monitoring, evaluation, goals: Push calories and fluids until patient can receive J-tube placement.  Patient continues to be high risk for furthering malnutrition. Provided 1 complementary case of Ensure Plus high-protein. Provided additional high-calorie high-protein samples. Encouraged bowel regimen.  Stressed importance of maintaining fluids and adequate nutrients.  Next visit: Tuesday, August 22 during infusion.  **Disclaimer: This note was dictated with voice recognition software. Similar sounding words can  inadvertently be transcribed and this note may contain transcription errors which may not have been corrected upon publication of note.**
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
## 2022-03-13 NOTE — Assessment & Plan Note (Signed)
Mucositis from antineoplastic therapy He currently is still able to eat and swallow. He start using the Magic mouthwash and will let us know if he needs any further pain medication.

## 2022-03-13 NOTE — Assessment & Plan Note (Signed)
He has lost about 3 pounds since his last visit.  We have once again discussed the caloric needs.  He will also continue to be monitored by nutrition team.  We will also arrange IV fluids 3 times a week as requested by radiation oncology team.

## 2022-03-14 ENCOUNTER — Ambulatory Visit
Admission: RE | Admit: 2022-03-14 | Discharge: 2022-03-14 | Disposition: A | Discharge status: Home / Self Care | Payer: Medicaid Other | Source: Ambulatory Visit | Attending: Radiation Oncology | Admitting: Radiation Oncology

## 2022-03-14 ENCOUNTER — Inpatient Hospital Stay: Payer: Medicaid Other

## 2022-03-14 ENCOUNTER — Other Ambulatory Visit: Payer: Self-pay | Admitting: Hematology

## 2022-03-14 ENCOUNTER — Other Ambulatory Visit: Payer: Self-pay

## 2022-03-14 VITALS — BP 92/62 | HR 87 | Temp 98.5°F | Resp 20 | Ht 59.0 in | Wt 117.5 lb

## 2022-03-14 DIAGNOSIS — Z5111 Encounter for antineoplastic chemotherapy: Secondary | ICD-10-CM | POA: Diagnosis not present

## 2022-03-14 DIAGNOSIS — C155 Malignant neoplasm of lower third of esophagus: Secondary | ICD-10-CM

## 2022-03-14 DIAGNOSIS — C01 Malignant neoplasm of base of tongue: Secondary | ICD-10-CM

## 2022-03-14 LAB — RAD ONC ARIA SESSION SUMMARY
Course Elapsed Days: 29
Plan Fractions Treated to Date: 22
Plan Prescribed Dose Per Fraction: 2 Gy
Plan Total Fractions Prescribed: 35
Plan Total Prescribed Dose: 70 Gy
Reference Point Dosage Given to Date: 44 Gy
Reference Point Session Dosage Given: 2 Gy
Session Number: 22

## 2022-03-14 MED ORDER — PALONOSETRON HCL INJECTION 0.25 MG/5ML
0.2500 mg | Freq: Once | INTRAVENOUS | Status: AC
  Administered 2022-03-14: 0.25 mg via INTRAVENOUS

## 2022-03-14 MED ORDER — SODIUM CHLORIDE 0.9% FLUSH
10.0000 mL | INTRAVENOUS | Status: DC | PRN
  Administered 2022-03-14: 10 mL

## 2022-03-14 MED ORDER — SODIUM CHLORIDE 0.9 % IV SOLN
40.0000 mg/m2 | Freq: Once | INTRAVENOUS | Status: AC
  Administered 2022-03-14: 61 mg via INTRAVENOUS
  Filled 2022-03-14: qty 61

## 2022-03-14 MED ORDER — HEPARIN SOD (PORK) LOCK FLUSH 100 UNIT/ML IV SOLN
500.0000 [IU] | Freq: Once | INTRAVENOUS | Status: AC | PRN
  Administered 2022-03-14: 500 [IU]

## 2022-03-14 MED ORDER — SODIUM CHLORIDE 0.9 % IV SOLN: Freq: Once | INTRAVENOUS | Status: AC

## 2022-03-14 MED ORDER — SODIUM CHLORIDE 0.9 % IV SOLN
10.0000 mg | Freq: Once | INTRAVENOUS | Status: AC
  Administered 2022-03-14: 10 mg via INTRAVENOUS
  Filled 2022-03-14: qty 10

## 2022-03-14 MED ORDER — MAGNESIUM SULFATE 2 GM/50ML IV SOLN
INTRAVENOUS | Status: AC
  Filled 2022-03-14: qty 50

## 2022-03-14 MED ORDER — POTASSIUM CHLORIDE IN NACL 20-0.9 MEQ/L-% IV SOLN
Freq: Once | INTRAVENOUS | Status: AC
  Filled 2022-03-14: qty 1000

## 2022-03-14 MED ORDER — MAGNESIUM SULFATE 2 GM/50ML IV SOLN
2.0000 g | Freq: Once | INTRAVENOUS | Status: AC
  Administered 2022-03-14: 2 g via INTRAVENOUS

## 2022-03-14 MED ORDER — SODIUM CHLORIDE 0.9 % IV SOLN
150.0000 mg | Freq: Once | INTRAVENOUS | Status: AC
  Administered 2022-03-14: 150 mg via INTRAVENOUS
  Filled 2022-03-14: qty 150

## 2022-03-14 MED ORDER — PALONOSETRON HCL INJECTION 0.25 MG/5ML
INTRAVENOUS | Status: AC
  Filled 2022-03-14: qty 5

## 2022-03-14 NOTE — Patient Instructions (Signed)
[facility]COL[facility]  Discharge Instructions: Thank you for choosing [facility] to provide your oncology and hematology care.   If you have a lab appointment with the [facility], please go directly to the [facility] and check in at the registration area.   Wear comfortable clothing and clothing appropriate for easy access to any Portacath or PICC line.   We strive to give you quality time with your provider. You may need to reschedule your appointment if you arrive late (15 or more minutes).  Arriving late affects you and other patients whose appointments are after yours.  Also, if you miss three or more appointments without notifying the office, you may be dismissed from the clinic at the provider's discretion.      For prescription refill requests, have your pharmacy contact our office and allow 72 hours for refills to be completed.    Today you received the following chemotherapy and/or immunotherapy agent: Cisplatin   To help prevent nausea and vomiting after your treatment, we encourage you to take your nausea medication as directed.  BELOW ARE SYMPTOMS THAT SHOULD BE REPORTED IMMEDIATELY: *FEVER GREATER THAN 100.4 F (38 C) OR HIGHER *CHILLS OR SWEATING *NAUSEA AND VOMITING THAT IS NOT CONTROLLED WITH YOUR NAUSEA MEDICATION *UNUSUAL SHORTNESS OF BREATH *UNUSUAL BRUISING OR BLEEDING *URINARY PROBLEMS (pain or burning when urinating, or frequent urination) *BOWEL PROBLEMS (unusual diarrhea, constipation, pain near the anus) TENDERNESS IN MOUTH AND THROAT WITH OR WITHOUT PRESENCE OF ULCERS (sore throat, sores in mouth, or a toothache) UNUSUAL RASH, SWELLING OR PAIN  UNUSUAL VAGINAL DISCHARGE OR ITCHING   Items with * indicate a potential emergency and should be followed up as soon as possible or go to the Emergency Department if any problems should occur.  Please show the CHEMOTHERAPY ALERT CARD or IMMUNOTHERAPY ALERT CARD at check-in to the  Emergency Department and triage nurse.  Should you have questions after your visit or need to cancel or reschedule your appointment, please contact [facility]  Dept: [phone]  and follow the prompts.  Office hours are 8:00 a.m. to 4:30 p.m. Monday - Friday. Please note that voicemails left after 4:00 p.m. may not be returned until the following business day.  We are closed weekends and major holidays. You have access to a nurse at all times for urgent questions. Please call the main number to the clinic Dept: [phone] and follow the prompts.   For any non-urgent questions, you may also contact your provider using MyChart. We now offer e-Visits for anyone 39 and older to request care online for non-urgent symptoms. For details visit [email or web address].   Also download the MyChart app! Go to the app store, search "MyChart", open the app, select [facility], and log in with your MyChart username and password.  Masks are optional in the cancer centers. If you would like for your care team to wear a mask while they are taking care of you, please let them know. You may have one support person who is at least 63 years old accompany you for your appointments. Cisplatin Injection What is this medication? CISPLATIN (SIS pla tin) treats some types of cancer. It works by slowing down the growth of cancer cells. This medicine may be used for other purposes; ask your health care provider or pharmacist if you have questions. COMMON BRAND NAME(S): Platinol, Platinol -AQ What should I tell my care team before I take this medication? They need to  know if you have any of these conditions: Eye disease, vision problems Hearing problems Kidney disease Low blood counts, such as low white cells, platelets, or red blood cells Tingling of the fingers or toes, or other nerve disorder An unusual or allergic reaction to cisplatin, carboplatin, oxaliplatin, other medications,  foods, dyes, or preservatives If you or your partner are pregnant or trying to get pregnant Breast-feeding How should I use this medication? This medication is injected into a vein. It is given by your care team in a hospital or clinic setting. Talk to your care team about the use of this medication in children. Special care may be needed. Overdosage: If you think you have taken too much of this medicine contact a poison control center or emergency room at once. NOTE: This medicine is only for you. Do not share this medicine with others. What if I miss a dose? Keep appointments for follow-up doses. It is important not to miss your dose. Call your care team if you are unable to keep an appointment. What may interact with this medication? Do not take this medication with any of the following: Live virus vaccines This medication may also interact with the following: Certain antibiotics, such as amikacin, gentamicin, neomycin, polymyxin B, streptomycin, tobramycin, vancomycin Foscarnet This list may not describe all possible interactions. Give your health care provider a list of all the medicines, herbs, non-prescription drugs, or dietary supplements you use. Also tell them if you smoke, drink alcohol, or use illegal drugs. Some items may interact with your medicine. What should I watch for while using this medication? Your condition will be monitored carefully while you are receiving this medication. You may need blood work done while taking this medication. This medication may make you feel generally unwell. This is not uncommon, as chemotherapy can affect healthy cells as well as cancer cells. Report any side effects. Continue your course of treatment even though you feel ill unless your care team tells you to stop. This medication may increase your risk of getting an infection. Call your care team for advice if you get a fever, chills, sore throat, or other symptoms of a cold or flu. Do not treat  yourself. Try to avoid being around people who are sick. Avoid taking medications that contain aspirin, acetaminophen, ibuprofen, naproxen, or ketoprofen unless instructed by your care team. These medications may hide a fever. This medication may increase your risk to bruise or bleed. Call your care team if you notice any unusual bleeding. Be careful brushing or flossing your teeth or using a toothpick because you may get an infection or bleed more easily. If you have any dental work done, tell your dentist you are receiving this medication. Drink fluids as directed while you are taking this medication. This will help protect your kidneys. Call your care team if you get diarrhea. Do not treat yourself. Talk to your care team if you or your partner wish to become pregnant or think you might be pregnant. This medication can cause serious birth defects if taken during pregnancy and for 14 months after the last dose. A negative pregnancy test is required before starting this medication. A reliable form of contraception is recommended while taking this medication and for 14 months after the last dose. Talk to your care team about effective forms of contraception. Do not father a child while taking this medication and for 11 months after the last dose. Use a condom during sex during this time period. Do not  breast-feed while taking this medication. This medication may cause infertility. Talk to your care team if you are concerned about your fertility. What side effects may I notice from receiving this medication? Side effects that you should report to your care team as soon as possible: Allergic reactions--skin rash, itching, hives, swelling of the face, lips, tongue, or throat Eye pain, change in vision, vision loss Hearing loss, ringing in ears Infection--fever, chills, cough, sore throat, wounds that don't heal, pain or trouble when passing urine, general feeling of discomfort or being unwell Kidney  injury--decrease in the amount of urine, swelling of the ankles, hands, or feet Low red blood cell level--unusual weakness or fatigue, dizziness, headache, trouble breathing Painful swelling, warmth, or redness of the skin, blisters or sores at the infusion site Pain, tingling, or numbness in the hands or feet Unusual bruising or bleeding Side effects that usually do not require medical attention (report to your care team if they continue or are bothersome): Hair loss Nausea Vomiting This list may not describe all possible side effects. Call your doctor for medical advice about side effects. You may report side effects to FDA at 1-800-FDA-1088. Where should I keep my medication? This medication is given in a hospital or clinic. It will not be stored at home. NOTE: This sheet is a summary. It may not cover all possible information. If you have questions about this medicine, talk to your doctor, pharmacist, or health care provider.  2023 Elsevier/Gold Standard (2021-11-14 00:00:00)

## 2022-03-14 NOTE — Progress Notes (Signed)
03/15/2022 Darrell Cunningham [id number] May 25, 1961  Referring provider: [name], MD Primary GI doctor: Dr. Hilarie Cunningham  ASSESSMENT AND PLAN:   Assessment: 61 y.o. male here for assessment of the following: 1. Malignant neoplasm of base of tongue ([place])   2. Hx of esophagectomy   3. Dysphagia, unspecified type   4. Pulmonary emphysema, unspecified emphysema type ([place])   5. Weight loss, unintentional   Esophageal cancer s/p esophagectomy 2018 with reoccurrence 11/2021, now getting radiation and chemotherapy.  Swallowing is okay for right now per patient, has had about 8 lbs weight loss.  Denies GERD  Plan: Lifestyle changes discussed, avoid NSAIDS Continue current medications with PPI BID Continue follow up with speech therapist, has OV tomorrow, given information about swallowing techniques Consider evaluation for PEG tube for nutritional support Will follow up 6-8 weeks after radiation therapy to be evaluate, patient has very little esophagus left, we do not want to evaluate with EGD until after proper healing to reduce the chance of perforation    History of Present Illness:  61 y.o. male  with a past medical history of COPD and others listed below, returns to clinic today for evaluation of recurrent esophageal cancer and dysphagia. History of esophageal cancer history 2018 stage 3, s/p  esophagectomy and  history of anastomotic stricture.  12/09/2021 CT soft tissue neck with contrast showed reoccurrence, confirmed with biopsy, started radiation and concurrent chemotherapy.  He has very little esophagus left with his esophagogastric anastomosis at about 19 cm from the incisors.  Dr. Hilarie Cunningham discussed case with Dr. Isidore Cunningham who states they prefer to wait until after he has healed from chemoradiation prior to EGD/dilatation.   06/03/2021 EGD with Dr. Hilarie Cunningham IMPRESSION: An esophagogastric anastomosis was found without esophagitis or recurrent malignancy. Dilated to 20 mm  with balloon. 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 - Normal examined duodenum. - No specimens collected.  He is still getting radiation Monday- Friday and chemotherapy once a week.  He has fatigue.  He was having odynophagia, treated for thrush and states this is helping.  He is able to get stuff down, rare dysphagia, can do hard swallow with liquid.  Has office visit with speech therapy tomorrow.  He states he had thrush that was treated, has improved his swallowing.  He is on protonix 40 mg twice a day, states very rare GERD.  Down about 8 lbs, he is seeing a dietician.  Last treatment will be sept 5th.   He  reports that he quit smoking about 9 years ago. His smoking use included cigarettes. He started smoking about 9 years ago. He has a 38.00 pack-year smoking history. He has never used smokeless tobacco. He reports that he does not drink alcohol and does not use drugs. His family history includes Breast cancer (age of onset: 5) in his sister; Breast cancer (age of onset: 51) in his maternal grandmother; COPD in his father; Colon cancer (age of onset: 68) in his maternal grandmother; Ovarian cancer in his maternal aunt.   Current Medications:   Current Outpatient Medications (Endocrine & Metabolic):    dexamethasone (DECADRON) 4 MG tablet, Take 8 mg daily x 3 days starting the day after cisplatin chemotherapy. Take with food.   Current Outpatient Medications (Cardiovascular):    atorvastatin (LIPITOR) 20 MG tablet, TAKE 1 TABLET BY MOUTH EVERY DAY   Current Outpatient Medications (Respiratory):    albuterol (VENTOLIN HFA) 108 (90 Base) MCG/ACT inhaler, Inhale 2 puffs into the lungs  every 6 (six) hours as needed for wheezing or shortness of breath.   benzonatate (TESSALON) 100 MG capsule, Take 1 capsule (100 mg total) by mouth 3 (three) times daily as needed for cough.   Budeson-Glycopyrrol-Formoterol (BREZTRI AEROSPHERE) 160-9-4.8 MCG/ACT AERO, Inhale 2 puffs into  the lungs 2 (two) times daily.   fluticasone (FLONASE) 50 MCG/ACT nasal spray, SPRAY 1 SPRAY INTO EACH NOSTRIL EVERY DAY AS NEEDED FOR ALLERGIES/RHINITIS   magic mouthwash (nystatin, lidocaine, diphenhydrAMINE, alum & mag hydroxide) suspension*, Take 38ms 4 times a day as needed as directed   Current Outpatient Medications (Analgesics):    acetaminophen (TYLENOL) 500 MG tablet, Take 500 mg by mouth every 6 (six) hours as needed for moderate pain.     Current Outpatient Medications (Other):    cyclobenzaprine (FLEXERIL) 5 MG tablet, Take 1 tablet (5 mg total) by mouth 3 (three) times daily as needed for muscle spasms.   fluconazole (DIFLUCAN) 100 MG tablet, Take 1 tablet (100 mg total) by mouth daily.   levETIRAcetam (KEPPRA) 100 MG/ML solution, Take 5 mLs (500 mg total) by mouth 2 (two) times daily.   lidocaine (XYLOCAINE) 2 % solution, Patient: Mix 1part 2% viscous lidocaine, 1part H20. Swish & swallow 17mof diluted mixture, 3023mbefore meals and at bedtime, up to QID   lidocaine-prilocaine (EMLA) cream, Apply to affected area once   magic mouthwash (nystatin, lidocaine, diphenhydrAMINE, alum & mag hydroxide) suspension*, Take 5ml57m times a day as needed as directed   ondansetron (ZOFRAN) 8 MG tablet, Take 1 tablet (8 mg total) by mouth 2 (two) times daily as needed. Start on the third day after cisplatin chemotherapy.   pantoprazole (PROTONIX) 40 MG tablet, TAKE 1 TABLET BY MOUTH TWICE A DAY   prochlorperazine (COMPAZINE) 10 MG tablet, Take 1 tablet (10 mg total) by mouth every 6 (six) hours as needed (Nausea or vomiting).   rOPINIRole (REQUIP) 1 MG tablet, Take 1 tablet (1 mg total) by mouth at bedtime.   senna-docusate (SENOKOT-S) 8.6-50 MG tablet, Take 2 tablets by mouth 2 (two) times daily.   sulfamethoxazole-trimethoprim (BACTRIM DS) 800-160 MG tablet, Take 1 tablet by mouth 2 (two) times daily.   Facility-Administered Medications Ordered in Other Visits (Other):    palonosetron  (ALOXI) 0.25 MG/5ML injection * These medications belong to multiple therapeutic classes and are listed under each applicable group. No current facility-administered medications for this visit.  Surgical History:  He  has a past surgical history that includes Mouth surgery; Eye surgery (Bilateral, age 1 );22EUS (N/A, 10/26/2016); IR FLUORO GUIDE PORT INSERTION RIGHT (11/08/2016); IR US GKoreade Vasc Access Right (11/08/2016); Video bronchoscopy (N/A, 04/16/2017); Complete esophagectomy (N/A, 04/16/2017); Jejunostomy (N/A, 04/16/2017); Upper gastrointestinal endoscopy; Colonoscopy; Jejunostomy; Esophagogastroduodenoscopy (egd) with propofol (N/A, 03/30/2019); Foreign Body Removal (03/30/2019); Polypectomy; IR REMOVAL TUN ACCESS W/ PORT W/O FL MOD SED (04/12/2020); Upper gastrointestinal endoscopy (06/03/2021); Direct laryngoscopy (N/A, 01/27/2022); Tonsillectomy (Bilateral, 01/27/2022); and IR IMAGING GUIDED PORT INSERTION (02/24/2022).  Current Medications, Allergies, Past Medical History, Past Surgical History, Family History and Social History were reviewed in [employer].  Physical Exam: BP 106/60 (BP Location: Left Arm, Patient Position: Sitting, Cuff Size: Normal)   Pulse 73   Ht '5\' 1"'$  (1.549 m)   Wt 120 lb 6.4 oz (54.6 kg)   SpO2 98%   BMI 22.75 kg/m  General:   Thin, chronically ill appearing male in no acute distress Heart : Regular rate and rhythm; no murmurs Pulm: Clear anteriorly; no wheezing  Abdomen:  Soft, Obese AB, Active bowel sounds. No tenderness . , No organomegaly appreciated. Rectal: Not evaluated Extremities:  without  edema. Neurologic:  Alert and  oriented x4;  No focal deficits.  Psych:  Cooperative. Normal mood and affect.   Vladimir Crofts, PA-C 03/15/22

## 2022-03-15 ENCOUNTER — Other Ambulatory Visit: Payer: Self-pay

## 2022-03-15 ENCOUNTER — Encounter: Payer: Self-pay | Admitting: Physician Assistant

## 2022-03-15 ENCOUNTER — Ambulatory Visit: Payer: Medicaid Other | Admitting: Physician Assistant

## 2022-03-15 ENCOUNTER — Ambulatory Visit
Admission: RE | Admit: 2022-03-15 | Discharge: 2022-03-15 | Disposition: A | Discharge status: Home / Self Care | Payer: Medicaid Other | Source: Ambulatory Visit | Attending: Radiation Oncology | Admitting: Radiation Oncology

## 2022-03-15 VITALS — BP 106/60 | HR 73 | Ht 61.0 in | Wt 120.4 lb

## 2022-03-15 DIAGNOSIS — R1310 Dysphagia, unspecified: Secondary | ICD-10-CM

## 2022-03-15 DIAGNOSIS — Z9049 Acquired absence of other specified parts of digestive tract: Secondary | ICD-10-CM | POA: Diagnosis not present

## 2022-03-15 DIAGNOSIS — R634 Abnormal weight loss: Secondary | ICD-10-CM

## 2022-03-15 DIAGNOSIS — C01 Malignant neoplasm of base of tongue: Secondary | ICD-10-CM | POA: Diagnosis not present

## 2022-03-15 DIAGNOSIS — J439 Emphysema, unspecified: Secondary | ICD-10-CM | POA: Diagnosis not present

## 2022-03-15 DIAGNOSIS — Z9889 Other specified postprocedural states: Secondary | ICD-10-CM

## 2022-03-15 DIAGNOSIS — Z5111 Encounter for antineoplastic chemotherapy: Secondary | ICD-10-CM | POA: Diagnosis not present

## 2022-03-15 LAB — RAD ONC ARIA SESSION SUMMARY
Course Elapsed Days: 30
Plan Fractions Treated to Date: 23
Plan Prescribed Dose Per Fraction: 2 Gy
Plan Total Fractions Prescribed: 35
Plan Total Prescribed Dose: 70 Gy
Reference Point Dosage Given to Date: 46 Gy
Reference Point Session Dosage Given: 2 Gy
Session Number: 23

## 2022-03-15 NOTE — Patient Instructions (Addendum)
If you are age 63 or younger, your body mass index should be between 19-25. Your Body mass index is 22.75 kg/m. If this is out of the aformentioned range listed, please consider follow up with your Primary Care Provider.  ________________________________________________________  The [facility] providers would like to encourage you to use [facility] to communicate with providers for non-urgent requests or questions.  Due to long hold times on the telephone, sending your provider a message by [facility] may be a faster and more efficient way to get a response.  Please allow 48 business hours for a response.  Please remember that this is for non-urgent requests.  _______________________________________________________  Dennis Bast are scheduled to follow up with Dr [name] on 05-11-22 at 11am.  Please take your proton pump inhibitor medication, protonix twice a day  Please take this medication 30 minutes to 1 hour before meals- this makes it more effective.  Can add pepcid or famotidine if it gets worse at night or as needed Avoid spicy and acidic foods Avoid fatty foods Limit your intake of coffee, tea, alcohol, and carbonated drinks Work to maintain a healthy weight Keep the head of the bed elevated at least 3 inches with blocks or a wedge pillow if you are having any nighttime symptoms Stay upright for 2 hours after eating Avoid meals and snacks three to four hours before bedtime  Thank you for entrusting me with your care and choosing [facility].  Vicie Mutters, PA-C

## 2022-03-15 NOTE — Progress Notes (Signed)
Addendum: Reviewed and agree with assessment and management plan. Deboraha Goar M, MD  

## 2022-03-16 ENCOUNTER — Ambulatory Visit
Admission: RE | Admit: 2022-03-16 | Discharge: 2022-03-16 | Disposition: A | Discharge status: Home / Self Care | Payer: Medicaid Other | Source: Ambulatory Visit | Attending: Radiation Oncology | Admitting: Radiation Oncology

## 2022-03-16 ENCOUNTER — Other Ambulatory Visit: Payer: Self-pay

## 2022-03-16 ENCOUNTER — Inpatient Hospital Stay: Payer: Medicaid Other | Admitting: Licensed Clinical Social Worker

## 2022-03-16 ENCOUNTER — Inpatient Hospital Stay: Payer: Medicaid Other

## 2022-03-16 ENCOUNTER — Ambulatory Visit: Payer: Medicaid Other | Attending: Radiation Oncology

## 2022-03-16 VITALS — BP 100/56 | HR 89 | Temp 98.2°F | Resp 17

## 2022-03-16 DIAGNOSIS — Z95828 Presence of other vascular implants and grafts: Secondary | ICD-10-CM

## 2022-03-16 DIAGNOSIS — C01 Malignant neoplasm of base of tongue: Secondary | ICD-10-CM

## 2022-03-16 DIAGNOSIS — R1310 Dysphagia, unspecified: Secondary | ICD-10-CM | POA: Diagnosis present

## 2022-03-16 DIAGNOSIS — Z5111 Encounter for antineoplastic chemotherapy: Secondary | ICD-10-CM | POA: Diagnosis not present

## 2022-03-16 DIAGNOSIS — C155 Malignant neoplasm of lower third of esophagus: Secondary | ICD-10-CM

## 2022-03-16 LAB — RAD ONC ARIA SESSION SUMMARY
Course Elapsed Days: 31
Plan Fractions Treated to Date: 24
Plan Prescribed Dose Per Fraction: 2 Gy
Plan Total Fractions Prescribed: 35
Plan Total Prescribed Dose: 70 Gy
Reference Point Dosage Given to Date: 48 Gy
Reference Point Session Dosage Given: 2 Gy
Session Number: 24

## 2022-03-16 MED ORDER — SODIUM CHLORIDE 0.9 % IV SOLN: Freq: Once | INTRAVENOUS | Status: AC

## 2022-03-16 MED ORDER — SODIUM CHLORIDE 0.9% FLUSH
10.0000 mL | Freq: Once | INTRAVENOUS | Status: AC
  Administered 2022-03-16: 10 mL

## 2022-03-16 MED ORDER — HEPARIN SOD (PORK) LOCK FLUSH 100 UNIT/ML IV SOLN
500.0000 [IU] | Freq: Once | INTRAVENOUS | Status: AC
  Administered 2022-03-16: 500 [IU]
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
## 2022-03-16 NOTE — Patient Instructions (Signed)
Rehydration, Adult Rehydration is the replacement of body fluids, salts, and minerals (electrolytes) that are lost during dehydration. Dehydration is when there is not enough water or other fluids in the body. This happens when you lose more fluids than you take in. Common causes of dehydration include: Not drinking enough fluids. This can occur when you are ill or doing activities that require a lot of energy, especially in hot weather. Conditions that cause loss of water or other fluids, such as diarrhea, vomiting, sweating, or urinating a lot. Other illnesses, such as fever or infection. Certain medicines, such as those that remove excess fluid from the body (diuretics). Symptoms of mild or moderate dehydration may include thirst, dry lips and mouth, and dizziness. Symptoms of severe dehydration may include increased heart rate, confusion, fainting, and not urinating. For severe dehydration, you may need to get fluids through an IV at the hospital. For mild or moderate dehydration, you can usually rehydrate at home by drinking certain fluids as told by your health care provider. What are the risks? Generally, rehydration is safe. However, taking in too much fluid (overhydration) can be a problem. This is rare. Overhydration can cause an electrolyte imbalance, kidney failure, or a decrease in salt (sodium) levels in the body. Supplies needed You will need an oral rehydration solution (ORS) if your health care provider tells you to use one. This is a drink to treat dehydration. It can be found in pharmacies and retail stores. How to rehydrate Fluids Follow instructions from your health care provider for rehydration. The kind of fluid and the amount you should drink depend on your condition. In general, you should choose drinks that you prefer. If told by your health care provider, drink an ORS. Make an ORS by following instructions on the package. Start by drinking small amounts, about  cup (120  mL) every 5-10 minutes. Slowly increase how much you drink until you have taken the amount recommended by your health care provider. Drink enough clear fluids to keep your urine pale yellow. If you were told to drink an ORS, finish it first, then start slowly drinking other clear fluids. Drink fluids such as: Water. This includes sparkling water and flavored water. Drinking only water can lead to having too little sodium in your body (hyponatremia). Follow the advice of your health care provider. Water from ice chips you suck on. Fruit juice with water you add to it (diluted). Sports drinks. Hot or cold herbal teas. Broth-based soups. Milk or milk products. Food Follow instructions from your health care provider about what to eat while you rehydrate. Your health care provider may recommend that you slowly begin eating regular foods in small amounts. Eat foods that contain a healthy balance of electrolytes, such as bananas, oranges, potatoes, tomatoes, and spinach. Avoid foods that are greasy or contain a lot of sugar. In some cases, you may get nutrition through a feeding tube that is passed through your nose and into your stomach (nasogastric tube, or NG tube). This may be done if you have uncontrolled vomiting or diarrhea. Beverages to avoid  Certain beverages may make dehydration worse. While you rehydrate, avoid drinking alcohol. How to tell if you are recovering from dehydration You may be recovering from dehydration if: You are urinating more often than before you started rehydrating. Your urine is pale yellow. Your energy level improves. You vomit less frequently. You have diarrhea less frequently. Your appetite improves or returns to normal. You feel less dizzy or less light-headed.   Your skin tone and color start to look more normal. Follow these instructions at home: Take over-the-counter and prescription medicines only as told by your health care provider. Do not take sodium  tablets. Doing this can lead to having too much sodium in your body (hypernatremia). Contact a health care provider if: You continue to have symptoms of mild or moderate dehydration, such as: Thirst. Dry lips. Slightly dry mouth. Dizziness. Dark urine or less urine than normal. Muscle cramps. You continue to vomit or have diarrhea. Get help right away if you: Have symptoms of dehydration that get worse. Have a fever. Have a severe headache. Have been vomiting and the following happens: Your vomiting gets worse or does not go away. Your vomit includes blood or green matter (bile). You cannot eat or drink without vomiting. Have problems with urination or bowel movements, such as: Diarrhea that gets worse or does not go away. Blood in your stool (feces). This may cause stool to look black and tarry. Not urinating, or urinating only a small amount of very dark urine, within 6-8 hours. Have trouble breathing. Have symptoms that get worse with treatment. These symptoms may represent a serious problem that is an emergency. Do not wait to see if the symptoms will go away. Get medical help right away. Call your local emergency services (911 in the [place] Do not drive yourself to the hospital. Summary Rehydration is the replacement of body fluids and minerals (electrolytes) that are lost during dehydration. Follow instructions from your health care provider for rehydration. The kind of fluid and amount you should drink depend on your condition. Slowly increase how much you drink until you have taken the amount recommended by your health care provider. Contact your health care provider if you continue to show signs of mild or moderate dehydration. This information is not intended to replace advice given to you by your health care provider. Make sure you discuss any questions you have with your health care provider. Document Revised: 09/17/2019 Document Reviewed: 07/28/2019 Elsevier Patient  Education  2023 [employer] Inc[employer]  

## 2022-03-16 NOTE — Progress Notes (Signed)
Darrell Cunningham Progress Note  Holiday representative met with patient and daughter to follow-up on resources. Pt has had financial constraints with extra co-pays for medications and driving to appts. Cunningham provided 3rd [employer] fund card today and bag of household items from food pantry.  Pt does not remember being signed up for [employer]. Cunningham messaged [employer] to determine is pt was approved.  Follow-up: Next week for final SF card   Olivier Frayre E Torris House, LCSW    Patient is participating in a Managed Medicaid Plan:  Yes

## 2022-03-17 ENCOUNTER — Ambulatory Visit
Admission: RE | Admit: 2022-03-17 | Discharge: 2022-03-17 | Disposition: A | Discharge status: Home / Self Care | Payer: Medicaid Other | Source: Ambulatory Visit | Attending: Radiation Oncology | Admitting: Radiation Oncology

## 2022-03-17 ENCOUNTER — Other Ambulatory Visit: Payer: Self-pay

## 2022-03-17 DIAGNOSIS — Z5111 Encounter for antineoplastic chemotherapy: Secondary | ICD-10-CM | POA: Diagnosis not present

## 2022-03-17 LAB — RAD ONC ARIA SESSION SUMMARY
Course Elapsed Days: 32
Plan Fractions Treated to Date: 25
Plan Prescribed Dose Per Fraction: 2 Gy
Plan Total Fractions Prescribed: 35
Plan Total Prescribed Dose: 70 Gy
Reference Point Dosage Given to Date: 50 Gy
Reference Point Session Dosage Given: 2 Gy
Session Number: 25

## 2022-03-18 ENCOUNTER — Inpatient Hospital Stay: Payer: Medicaid Other

## 2022-03-20 ENCOUNTER — Other Ambulatory Visit: Payer: Self-pay

## 2022-03-20 ENCOUNTER — Other Ambulatory Visit: Payer: Self-pay | Admitting: *Deleted

## 2022-03-20 ENCOUNTER — Ambulatory Visit: Payer: Medicaid Other

## 2022-03-20 ENCOUNTER — Inpatient Hospital Stay (HOSPITAL_BASED_OUTPATIENT_CLINIC_OR_DEPARTMENT_OTHER): Payer: Medicaid Other | Admitting: Hematology and Oncology

## 2022-03-20 ENCOUNTER — Inpatient Hospital Stay: Payer: Medicaid Other

## 2022-03-20 ENCOUNTER — Encounter: Payer: Self-pay | Admitting: Hematology and Oncology

## 2022-03-20 ENCOUNTER — Ambulatory Visit
Admission: RE | Admit: 2022-03-20 | Discharge: 2022-03-20 | Disposition: A | Discharge status: Home / Self Care | Payer: Medicaid Other | Source: Ambulatory Visit | Attending: Radiation Oncology | Admitting: Radiation Oncology

## 2022-03-20 VITALS — BP 100/58 | HR 72 | Temp 98.1°F | Resp 18

## 2022-03-20 VITALS — BP 113/65 | HR 76 | Temp 97.9°F | Resp 16 | Ht 61.0 in | Wt 118.4 lb

## 2022-03-20 DIAGNOSIS — C01 Malignant neoplasm of base of tongue: Secondary | ICD-10-CM

## 2022-03-20 DIAGNOSIS — C155 Malignant neoplasm of lower third of esophagus: Secondary | ICD-10-CM

## 2022-03-20 DIAGNOSIS — Z5111 Encounter for antineoplastic chemotherapy: Secondary | ICD-10-CM | POA: Diagnosis not present

## 2022-03-20 DIAGNOSIS — Z95828 Presence of other vascular implants and grafts: Secondary | ICD-10-CM

## 2022-03-20 LAB — BASIC METABOLIC PANEL - CANCER CENTER ONLY
Anion gap: 7 (ref 5–15)
BUN: 21 mg/dL (ref 8–23)
CO2: 26 mmol/L (ref 22–32)
Calcium: 8.6 mg/dL — ABNORMAL LOW (ref 8.9–10.3)
Chloride: 102 mmol/L (ref 98–111)
Creatinine: 0.64 mg/dL (ref 0.61–1.24)
GFR, Estimated: >60 mL/min (ref >60)
Glucose, Bld: 99 mg/dL (ref 70–99)
Potassium: 3.7 mmol/L (ref 3.5–5.1)
Sodium: 135 mmol/L (ref 135–145)

## 2022-03-20 LAB — CBC WITH DIFFERENTIAL (CANCER CENTER ONLY)
Abs Immature Granulocytes: 0.02 10*3/uL (ref 0.00–0.07)
Basophils Absolute: 0 10*3/uL (ref 0.0–0.1)
Basophils Relative: 0 %
Eosinophils Absolute: 0 10*3/uL (ref 0.0–0.5)
Eosinophils Relative: 1 %
HCT: 37.4 % — ABNORMAL LOW (ref 39.0–52.0)
Hemoglobin: 13 g/dL (ref 13.0–17.0)
Immature Granulocytes: 0 %
Lymphocytes Relative: 3 %
Lymphs Abs: 0.2 10*3/uL — ABNORMAL LOW (ref 0.7–4.0)
MCH: 31 pg (ref 26.0–34.0)
MCHC: 34.8 g/dL (ref 30.0–36.0)
MCV: 89.3 fL (ref 80.0–100.0)
Monocytes Absolute: 0.4 10*3/uL (ref 0.1–1.0)
Monocytes Relative: 6 %
Neutro Abs: 5.6 10*3/uL (ref 1.7–7.7)
Neutrophils Relative %: 90 %
Platelet Count: 164 10*3/uL (ref 150–400)
RBC: 4.19 MIL/uL — ABNORMAL LOW (ref 4.22–5.81)
RDW: 13.2 % (ref 11.5–15.5)
WBC Count: 6.2 10*3/uL (ref 4.0–10.5)
nRBC: 0 % (ref 0.0–0.2)

## 2022-03-20 LAB — RAD ONC ARIA SESSION SUMMARY
Course Elapsed Days: 35
Plan Fractions Treated to Date: 26
Plan Prescribed Dose Per Fraction: 2 Gy
Plan Total Fractions Prescribed: 35
Plan Total Prescribed Dose: 70 Gy
Reference Point Dosage Given to Date: 52 Gy
Reference Point Session Dosage Given: 2 Gy
Session Number: 26

## 2022-03-20 LAB — MAGNESIUM: Magnesium: 2 mg/dL (ref 1.7–2.4)

## 2022-03-20 MED ORDER — HEPARIN SOD (PORK) LOCK FLUSH 100 UNIT/ML IV SOLN
500.0000 [IU] | Freq: Once | INTRAVENOUS | Status: AC
  Administered 2022-03-20: 500 [IU]

## 2022-03-20 MED ORDER — SODIUM CHLORIDE 0.9 % IV SOLN: INTRAVENOUS | Status: DC

## 2022-03-20 MED ORDER — SODIUM CHLORIDE 0.9% FLUSH
10.0000 mL | Freq: Once | INTRAVENOUS | Status: AC
  Administered 2022-03-20: 10 mL

## 2022-03-20 MED FILL — Dexamethasone Sodium Phosphate Inj 100 MG/10ML: INTRAMUSCULAR | Qty: 1 | Status: AC

## 2022-03-20 MED FILL — Fosaprepitant Dimeglumine For IV Infusion 150 MG (Base Eq): INTRAVENOUS | Qty: 5 | Status: AC

## 2022-03-20 NOTE — Assessment & Plan Note (Signed)
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  He is scheduled to receive his cycle 4-day 1 of cisplatin tomorrow and he is here for follow-up. Patient is doing well today.  He continues to be able to eat well, has gained some weight.  Pain is tolerable.  He does not need any pain medication, using baking soda rinses. Physical examination today consistent with mild mucositis and erythema from ongoing treatment. He will continue treatment as planned.  Anticipate 5 cycles of weekly cisplatin, he will finish radiation on 03/31/2022. Okay to proceed with treatment tomorrow if labs are in within parameters. Appears to be no dose-limiting toxicity from chemo.
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
## 2022-03-20 NOTE — Patient Instructions (Signed)
Rehydration, Adult Rehydration is the replacement of body fluids, salts, and minerals (electrolytes) that are lost during dehydration. Dehydration is when there is not enough water or other fluids in the body. This happens when you lose more fluids than you take in. Common causes of dehydration include: Not drinking enough fluids. This can occur when you are ill or doing activities that require a lot of energy, especially in hot weather. Conditions that cause loss of water or other fluids, such as diarrhea, vomiting, sweating, or urinating a lot. Other illnesses, such as fever or infection. Certain medicines, such as those that remove excess fluid from the body (diuretics). Symptoms of mild or moderate dehydration may include thirst, dry lips and mouth, and dizziness. Symptoms of severe dehydration may include increased heart rate, confusion, fainting, and not urinating. For severe dehydration, you may need to get fluids through an IV at the hospital. For mild or moderate dehydration, you can usually rehydrate at home by drinking certain fluids as told by your health care provider. What are the risks? Generally, rehydration is safe. However, taking in too much fluid (overhydration) can be a problem. This is rare. Overhydration can cause an electrolyte imbalance, kidney failure, or a decrease in salt (sodium) levels in the body. Supplies needed You will need an oral rehydration solution (ORS) if your health care provider tells you to use one. This is a drink to treat dehydration. It can be found in pharmacies and retail stores. How to rehydrate Fluids Follow instructions from your health care provider for rehydration. The kind of fluid and the amount you should drink depend on your condition. In general, you should choose drinks that you prefer. If told by your health care provider, drink an ORS. Make an ORS by following instructions on the package. Start by drinking small amounts, about  cup (120  mL) every 5-10 minutes. Slowly increase how much you drink until you have taken the amount recommended by your health care provider. Drink enough clear fluids to keep your urine pale yellow. If you were told to drink an ORS, finish it first, then start slowly drinking other clear fluids. Drink fluids such as: Water. This includes sparkling water and flavored water. Drinking only water can lead to having too little sodium in your body (hyponatremia). Follow the advice of your health care provider. Water from ice chips you suck on. Fruit juice with water you add to it (diluted). Sports drinks. Hot or cold herbal teas. Broth-based soups. Milk or milk products. Food Follow instructions from your health care provider about what to eat while you rehydrate. Your health care provider may recommend that you slowly begin eating regular foods in small amounts. Eat foods that contain a healthy balance of electrolytes, such as bananas, oranges, potatoes, tomatoes, and spinach. Avoid foods that are greasy or contain a lot of sugar. In some cases, you may get nutrition through a feeding tube that is passed through your nose and into your stomach (nasogastric tube, or NG tube). This may be done if you have uncontrolled vomiting or diarrhea. Beverages to avoid  Certain beverages may make dehydration worse. While you rehydrate, avoid drinking alcohol. How to tell if you are recovering from dehydration You may be recovering from dehydration if: You are urinating more often than before you started rehydrating. Your urine is pale yellow. Your energy level improves. You vomit less frequently. You have diarrhea less frequently. Your appetite improves or returns to normal. You feel less dizzy or less light-headed.   Your skin tone and color start to look more normal. Follow these instructions at home: Take over-the-counter and prescription medicines only as told by your health care provider. Do not take sodium  tablets. Doing this can lead to having too much sodium in your body (hypernatremia). Contact a health care provider if: You continue to have symptoms of mild or moderate dehydration, such as: Thirst. Dry lips. Slightly dry mouth. Dizziness. Dark urine or less urine than normal. Muscle cramps. You continue to vomit or have diarrhea. Get help right away if you: Have symptoms of dehydration that get worse. Have a fever. Have a severe headache. Have been vomiting and the following happens: Your vomiting gets worse or does not go away. Your vomit includes blood or green matter (bile). You cannot eat or drink without vomiting. Have problems with urination or bowel movements, such as: Diarrhea that gets worse or does not go away. Blood in your stool (feces). This may cause stool to look black and tarry. Not urinating, or urinating only a small amount of very dark urine, within 6-8 hours. Have trouble breathing. Have symptoms that get worse with treatment. These symptoms may represent a serious problem that is an emergency. Do not wait to see if the symptoms will go away. Get medical help right away. Call your local emergency services (911 in the [place] Do not drive yourself to the hospital. Summary Rehydration is the replacement of body fluids and minerals (electrolytes) that are lost during dehydration. Follow instructions from your health care provider for rehydration. The kind of fluid and amount you should drink depend on your condition. Slowly increase how much you drink until you have taken the amount recommended by your health care provider. Contact your health care provider if you continue to show signs of mild or moderate dehydration. This information is not intended to replace advice given to you by your health care provider. Make sure you discuss any questions you have with your health care provider. Document Revised: 09/17/2019 Document Reviewed: 07/28/2019 Elsevier Patient  Education  2023 [employer] Inc[employer]  

## 2022-03-20 NOTE — Progress Notes (Signed)
Per Dr. Melene Muller to run IVF over 1 hour today.

## 2022-03-21 ENCOUNTER — Other Ambulatory Visit: Payer: Self-pay

## 2022-03-21 ENCOUNTER — Inpatient Hospital Stay: Payer: Medicaid Other | Admitting: Licensed Clinical Social Worker

## 2022-03-21 ENCOUNTER — Inpatient Hospital Stay: Payer: Medicaid Other | Admitting: Nutrition

## 2022-03-21 ENCOUNTER — Inpatient Hospital Stay: Payer: Medicaid Other

## 2022-03-21 ENCOUNTER — Ambulatory Visit
Admission: RE | Admit: 2022-03-21 | Discharge: 2022-03-21 | Disposition: A | Discharge status: Home / Self Care | Payer: Medicaid Other | Source: Ambulatory Visit | Attending: Radiation Oncology | Admitting: Radiation Oncology

## 2022-03-21 VITALS — BP 111/69 | HR 73 | Temp 98.4°F | Resp 16

## 2022-03-21 DIAGNOSIS — C155 Malignant neoplasm of lower third of esophagus: Secondary | ICD-10-CM

## 2022-03-21 DIAGNOSIS — Z5111 Encounter for antineoplastic chemotherapy: Secondary | ICD-10-CM | POA: Diagnosis not present

## 2022-03-21 DIAGNOSIS — C01 Malignant neoplasm of base of tongue: Secondary | ICD-10-CM

## 2022-03-21 LAB — RAD ONC ARIA SESSION SUMMARY
Course Elapsed Days: 36
Plan Fractions Treated to Date: 27
Plan Prescribed Dose Per Fraction: 2 Gy
Plan Total Fractions Prescribed: 35
Plan Total Prescribed Dose: 70 Gy
Reference Point Dosage Given to Date: 54 Gy
Reference Point Session Dosage Given: 2 Gy
Session Number: 27

## 2022-03-21 MED ORDER — SODIUM CHLORIDE 0.9 % IV SOLN
10.0000 mg | Freq: Once | INTRAVENOUS | Status: AC
  Administered 2022-03-21: 10 mg via INTRAVENOUS
  Filled 2022-03-21: qty 10

## 2022-03-21 MED ORDER — SODIUM CHLORIDE 0.9 % IV SOLN: Freq: Once | INTRAVENOUS | Status: AC

## 2022-03-21 MED ORDER — SODIUM CHLORIDE 0.9 % IV SOLN
150.0000 mg | Freq: Once | INTRAVENOUS | Status: AC
  Administered 2022-03-21: 150 mg via INTRAVENOUS
  Filled 2022-03-21: qty 150

## 2022-03-21 MED ORDER — HEPARIN SOD (PORK) LOCK FLUSH 100 UNIT/ML IV SOLN
500.0000 [IU] | Freq: Once | INTRAVENOUS | Status: AC | PRN
  Administered 2022-03-21: 500 [IU]

## 2022-03-21 MED ORDER — SODIUM CHLORIDE 0.9% FLUSH
10.0000 mL | INTRAVENOUS | Status: DC | PRN
  Administered 2022-03-21: 10 mL

## 2022-03-21 MED ORDER — MAGNESIUM SULFATE 2 GM/50ML IV SOLN
2.0000 g | Freq: Once | INTRAVENOUS | Status: AC
  Administered 2022-03-21: 2 g via INTRAVENOUS
  Filled 2022-03-21: qty 50

## 2022-03-21 MED ORDER — PALONOSETRON HCL INJECTION 0.25 MG/5ML
0.2500 mg | Freq: Once | INTRAVENOUS | Status: AC
  Administered 2022-03-21: 0.25 mg via INTRAVENOUS
  Filled 2022-03-21: qty 5

## 2022-03-21 MED ORDER — POTASSIUM CHLORIDE IN NACL 20-0.9 MEQ/L-% IV SOLN
Freq: Once | INTRAVENOUS | Status: AC
  Filled 2022-03-21: qty 1000

## 2022-03-21 MED ORDER — SODIUM CHLORIDE 0.9 % IV SOLN
40.0000 mg/m2 | Freq: Once | INTRAVENOUS | Status: AC
  Administered 2022-03-21: 61 mg via INTRAVENOUS
  Filled 2022-03-21: qty 61

## 2022-03-21 NOTE — Progress Notes (Signed)
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  He is status post esophagectomy in 2018 for esophageal cancer.  Reports he does have a consultation to learn more about a J-tube feeding tube but does not plan on getting one at this time.  He is eating better.  He tolerates hotdogs, egg salad, soup, scrambled eggs, and oatmeal.  He is drinking 2 Ensure plus high-protein daily.  Patient denies nutrition impact symptoms.  Noted thrush.  Weight documented as 118 pounds 6.4 ounces on August 21.  This is improved from 116 pounds 6 ounces August 14.  Labs were reviewed.  Nutrition diagnosis: Inadequate oral intake, ongoing.  Intervention: Educated patient to continue strategies for eating soft foods and liquid supplements. Continue swallowing exercises per speech therapy. Educated to increase Ensure Plus high-protein to 3 bottles daily.  Monitoring, evaluation, goals: Patient will tolerate adequate calories and protein to minimize weight loss.  Next visit: Tuesday, August 29 during infusion.  **Disclaimer: This note was dictated with voice recognition software. Similar sounding words can inadvertently be transcribed and this note may contain transcription errors which may not have been corrected upon publication of note.**

## 2022-03-21 NOTE — Progress Notes (Signed)
[facility] CSW Progress Note  Holiday representative met with patient to provide last [place] card. Provided contact info for [employer] to discuss [employer]. Provided bag of household items from food pantry. No other needs today.    [facility] Mom E Johnice Riebe, LCSW    Patient is participating in a Managed Medicaid Plan:  Yes

## 2022-03-21 NOTE — Patient Instructions (Signed)
[facility]  Discharge Instructions: Thank you for choosing [facility] to provide your oncology and hematology care.   If you have a lab appointment with the [facility], please go directly to the [facility] and check in at the registration area.   Wear comfortable clothing and clothing appropriate for easy access to any Portacath or PICC line.   We strive to give you quality time with your provider. You may need to reschedule your appointment if you arrive late (15 or more minutes).  Arriving late affects you and other patients whose appointments are after yours.  Also, if you miss three or more appointments without notifying the office, you may be dismissed from the clinic at the provider's discretion.      For prescription refill requests, have your pharmacy contact our office and allow 72 hours for refills to be completed.    Today you received the following chemotherapy and/or immunotherapy agent: Cisplatin   To help prevent nausea and vomiting after your treatment, we encourage you to take your nausea medication as directed.  BELOW ARE SYMPTOMS THAT SHOULD BE REPORTED IMMEDIATELY: *FEVER GREATER THAN 100.4 F (38 C) OR HIGHER *CHILLS OR SWEATING *NAUSEA AND VOMITING THAT IS NOT CONTROLLED WITH YOUR NAUSEA MEDICATION *UNUSUAL SHORTNESS OF BREATH *UNUSUAL BRUISING OR BLEEDING *URINARY PROBLEMS (pain or burning when urinating, or frequent urination) *BOWEL PROBLEMS (unusual diarrhea, constipation, pain near the anus) TENDERNESS IN MOUTH AND THROAT WITH OR WITHOUT PRESENCE OF ULCERS (sore throat, sores in mouth, or a toothache) UNUSUAL RASH, SWELLING OR PAIN  UNUSUAL VAGINAL DISCHARGE OR ITCHING   Items with * indicate a potential emergency and should be followed up as soon as possible or go to the Emergency Department if any problems should occur.  Please show the CHEMOTHERAPY ALERT CARD or IMMUNOTHERAPY ALERT CARD at check-in to the  Emergency Department and triage nurse.  Should you have questions after your visit or need to cancel or reschedule your appointment, please contact [facility]  Dept: [phone]  and follow the prompts.  Office hours are 8:00 a.m. to 4:30 p.m. Monday - Friday. Please note that voicemails left after 4:00 p.m. may not be returned until the following business day.  We are closed weekends and major holidays. You have access to a nurse at all times for urgent questions. Please call the main number to the clinic Dept: [phone] and follow the prompts.   For any non-urgent questions, you may also contact your provider using MyChart. We now offer e-Visits for anyone 39 and older to request care online for non-urgent symptoms. For details visit [email or web address].   Also download the MyChart app! Go to the app store, search "MyChart", open the app, select [facility], and log in with your MyChart username and password.  Masks are optional in the cancer centers. If you would like for your care team to wear a mask while they are taking care of you, please let them know. You may have one support person who is at least 63 years old accompany you for your appointments. Cisplatin Injection What is this medication? CISPLATIN (SIS pla tin) treats some types of cancer. It works by slowing down the growth of cancer cells. This medicine may be used for other purposes; ask your health care provider or pharmacist if you have questions. COMMON BRAND NAME(S): Platinol, Platinol -AQ What should I tell my care team before I take this medication? They need to  know if you have any of these conditions: Eye disease, vision problems Hearing problems Kidney disease Low blood counts, such as low white cells, platelets, or red blood cells Tingling of the fingers or toes, or other nerve disorder An unusual or allergic reaction to cisplatin, carboplatin, oxaliplatin, other medications,  foods, dyes, or preservatives If you or your partner are pregnant or trying to get pregnant Breast-feeding How should I use this medication? This medication is injected into a vein. It is given by your care team in a hospital or clinic setting. Talk to your care team about the use of this medication in children. Special care may be needed. Overdosage: If you think you have taken too much of this medicine contact a poison control center or emergency room at once. NOTE: This medicine is only for you. Do not share this medicine with others. What if I miss a dose? Keep appointments for follow-up doses. It is important not to miss your dose. Call your care team if you are unable to keep an appointment. What may interact with this medication? Do not take this medication with any of the following: Live virus vaccines This medication may also interact with the following: Certain antibiotics, such as amikacin, gentamicin, neomycin, polymyxin B, streptomycin, tobramycin, vancomycin Foscarnet This list may not describe all possible interactions. Give your health care provider a list of all the medicines, herbs, non-prescription drugs, or dietary supplements you use. Also tell them if you smoke, drink alcohol, or use illegal drugs. Some items may interact with your medicine. What should I watch for while using this medication? Your condition will be monitored carefully while you are receiving this medication. You may need blood work done while taking this medication. This medication may make you feel generally unwell. This is not uncommon, as chemotherapy can affect healthy cells as well as cancer cells. Report any side effects. Continue your course of treatment even though you feel ill unless your care team tells you to stop. This medication may increase your risk of getting an infection. Call your care team for advice if you get a fever, chills, sore throat, or other symptoms of a cold or flu. Do not treat  yourself. Try to avoid being around people who are sick. Avoid taking medications that contain aspirin, acetaminophen, ibuprofen, naproxen, or ketoprofen unless instructed by your care team. These medications may hide a fever. This medication may increase your risk to bruise or bleed. Call your care team if you notice any unusual bleeding. Be careful brushing or flossing your teeth or using a toothpick because you may get an infection or bleed more easily. If you have any dental work done, tell your dentist you are receiving this medication. Drink fluids as directed while you are taking this medication. This will help protect your kidneys. Call your care team if you get diarrhea. Do not treat yourself. Talk to your care team if you or your partner wish to become pregnant or think you might be pregnant. This medication can cause serious birth defects if taken during pregnancy and for 14 months after the last dose. A negative pregnancy test is required before starting this medication. A reliable form of contraception is recommended while taking this medication and for 14 months after the last dose. Talk to your care team about effective forms of contraception. Do not father a child while taking this medication and for 11 months after the last dose. Use a condom during sex during this time period. Do not  breast-feed while taking this medication. This medication may cause infertility. Talk to your care team if you are concerned about your fertility. What side effects may I notice from receiving this medication? Side effects that you should report to your care team as soon as possible: Allergic reactions--skin rash, itching, hives, swelling of the face, lips, tongue, or throat Eye pain, change in vision, vision loss Hearing loss, ringing in ears Infection--fever, chills, cough, sore throat, wounds that don't heal, pain or trouble when passing urine, general feeling of discomfort or being unwell Kidney  injury--decrease in the amount of urine, swelling of the ankles, hands, or feet Low red blood cell level--unusual weakness or fatigue, dizziness, headache, trouble breathing Painful swelling, warmth, or redness of the skin, blisters or sores at the infusion site Pain, tingling, or numbness in the hands or feet Unusual bruising or bleeding Side effects that usually do not require medical attention (report to your care team if they continue or are bothersome): Hair loss Nausea Vomiting This list may not describe all possible side effects. Call your doctor for medical advice about side effects. You may report side effects to FDA at 1-800-FDA-1088. Where should I keep my medication? This medication is given in a hospital or clinic. It will not be stored at home. NOTE: This sheet is a summary. It may not cover all possible information. If you have questions about this medicine, talk to your doctor, pharmacist, or health care provider.  2023 Elsevier/Gold Standard (2021-11-14 00:00:00)

## 2022-03-22 ENCOUNTER — Other Ambulatory Visit: Payer: Self-pay

## 2022-03-22 ENCOUNTER — Ambulatory Visit
Admission: RE | Admit: 2022-03-22 | Discharge: 2022-03-22 | Disposition: A | Discharge status: Home / Self Care | Payer: Medicaid Other | Source: Ambulatory Visit | Attending: Radiation Oncology | Admitting: Radiation Oncology

## 2022-03-22 ENCOUNTER — Inpatient Hospital Stay: Payer: Medicaid Other

## 2022-03-22 VITALS — BP 112/61 | HR 75 | Temp 98.2°F | Resp 17

## 2022-03-22 DIAGNOSIS — Z95828 Presence of other vascular implants and grafts: Secondary | ICD-10-CM

## 2022-03-22 DIAGNOSIS — C155 Malignant neoplasm of lower third of esophagus: Secondary | ICD-10-CM

## 2022-03-22 DIAGNOSIS — Z5111 Encounter for antineoplastic chemotherapy: Secondary | ICD-10-CM | POA: Diagnosis not present

## 2022-03-22 LAB — RAD ONC ARIA SESSION SUMMARY
Course Elapsed Days: 37
Plan Fractions Treated to Date: 28
Plan Prescribed Dose Per Fraction: 2 Gy
Plan Total Fractions Prescribed: 35
Plan Total Prescribed Dose: 70 Gy
Reference Point Dosage Given to Date: 56 Gy
Reference Point Session Dosage Given: 2 Gy
Session Number: 28

## 2022-03-22 MED ORDER — SODIUM CHLORIDE 0.9% FLUSH
10.0000 mL | Freq: Once | INTRAVENOUS | Status: AC
  Administered 2022-03-22: 10 mL

## 2022-03-22 MED ORDER — SODIUM CHLORIDE 0.9 % IV SOLN: INTRAVENOUS | Status: DC

## 2022-03-22 MED ORDER — HEPARIN SOD (PORK) LOCK FLUSH 100 UNIT/ML IV SOLN
500.0000 [IU] | Freq: Once | INTRAVENOUS | Status: AC
  Administered 2022-03-22: 500 [IU]

## 2022-03-22 NOTE — Patient Instructions (Signed)
Rehydration, Adult Rehydration is the replacement of body fluids, salts, and minerals (electrolytes) that are lost during dehydration. Dehydration is when there is not enough water or other fluids in the body. This happens when you lose more fluids than you take in. Common causes of dehydration include: Not drinking enough fluids. This can occur when you are ill or doing activities that require a lot of energy, especially in hot weather. Conditions that cause loss of water or other fluids, such as diarrhea, vomiting, sweating, or urinating a lot. Other illnesses, such as fever or infection. Certain medicines, such as those that remove excess fluid from the body (diuretics). Symptoms of mild or moderate dehydration may include thirst, dry lips and mouth, and dizziness. Symptoms of severe dehydration may include increased heart rate, confusion, fainting, and not urinating. For severe dehydration, you may need to get fluids through an IV at the hospital. For mild or moderate dehydration, you can usually rehydrate at home by drinking certain fluids as told by your health care provider. What are the risks? Generally, rehydration is safe. However, taking in too much fluid (overhydration) can be a problem. This is rare. Overhydration can cause an electrolyte imbalance, kidney failure, or a decrease in salt (sodium) levels in the body. Supplies needed You will need an oral rehydration solution (ORS) if your health care provider tells you to use one. This is a drink to treat dehydration. It can be found in pharmacies and retail stores. How to rehydrate Fluids Follow instructions from your health care provider for rehydration. The kind of fluid and the amount you should drink depend on your condition. In general, you should choose drinks that you prefer. If told by your health care provider, drink an ORS. Make an ORS by following instructions on the package. Start by drinking small amounts, about  cup (120  mL) every 5-10 minutes. Slowly increase how much you drink until you have taken the amount recommended by your health care provider. Drink enough clear fluids to keep your urine pale yellow. If you were told to drink an ORS, finish it first, then start slowly drinking other clear fluids. Drink fluids such as: Water. This includes sparkling water and flavored water. Drinking only water can lead to having too little sodium in your body (hyponatremia). Follow the advice of your health care provider. Water from ice chips you suck on. Fruit juice with water you add to it (diluted). Sports drinks. Hot or cold herbal teas. Broth-based soups. Milk or milk products. Food Follow instructions from your health care provider about what to eat while you rehydrate. Your health care provider may recommend that you slowly begin eating regular foods in small amounts. Eat foods that contain a healthy balance of electrolytes, such as bananas, oranges, potatoes, tomatoes, and spinach. Avoid foods that are greasy or contain a lot of sugar. In some cases, you may get nutrition through a feeding tube that is passed through your nose and into your stomach (nasogastric tube, or NG tube). This may be done if you have uncontrolled vomiting or diarrhea. Beverages to avoid  Certain beverages may make dehydration worse. While you rehydrate, avoid drinking alcohol. How to tell if you are recovering from dehydration You may be recovering from dehydration if: You are urinating more often than before you started rehydrating. Your urine is pale yellow. Your energy level improves. You vomit less frequently. You have diarrhea less frequently. Your appetite improves or returns to normal. You feel less dizzy or less light-headed.   Your skin tone and color start to look more normal. Follow these instructions at home: Take over-the-counter and prescription medicines only as told by your health care provider. Do not take sodium  tablets. Doing this can lead to having too much sodium in your body (hypernatremia). Contact a health care provider if: You continue to have symptoms of mild or moderate dehydration, such as: Thirst. Dry lips. Slightly dry mouth. Dizziness. Dark urine or less urine than normal. Muscle cramps. You continue to vomit or have diarrhea. Get help right away if you: Have symptoms of dehydration that get worse. Have a fever. Have a severe headache. Have been vomiting and the following happens: Your vomiting gets worse or does not go away. Your vomit includes blood or green matter (bile). You cannot eat or drink without vomiting. Have problems with urination or bowel movements, such as: Diarrhea that gets worse or does not go away. Blood in your stool (feces). This may cause stool to look black and tarry. Not urinating, or urinating only a small amount of very dark urine, within 6-8 hours. Have trouble breathing. Have symptoms that get worse with treatment. These symptoms may represent a serious problem that is an emergency. Do not wait to see if the symptoms will go away. Get medical help right away. Call your local emergency services (911 in the [place] Do not drive yourself to the hospital. Summary Rehydration is the replacement of body fluids and minerals (electrolytes) that are lost during dehydration. Follow instructions from your health care provider for rehydration. The kind of fluid and amount you should drink depend on your condition. Slowly increase how much you drink until you have taken the amount recommended by your health care provider. Contact your health care provider if you continue to show signs of mild or moderate dehydration. This information is not intended to replace advice given to you by your health care provider. Make sure you discuss any questions you have with your health care provider. Document Revised: 09/17/2019 Document Reviewed: 07/28/2019 Elsevier Patient  Education  2023 [employer] Inc[employer]  

## 2022-03-23 ENCOUNTER — Other Ambulatory Visit: Payer: Self-pay

## 2022-03-23 ENCOUNTER — Ambulatory Visit
Admission: RE | Admit: 2022-03-23 | Discharge: 2022-03-23 | Disposition: A | Discharge status: Home / Self Care | Payer: Medicaid Other | Source: Ambulatory Visit | Attending: Radiation Oncology | Admitting: Radiation Oncology

## 2022-03-23 DIAGNOSIS — Z5111 Encounter for antineoplastic chemotherapy: Secondary | ICD-10-CM | POA: Diagnosis not present

## 2022-03-23 LAB — RAD ONC ARIA SESSION SUMMARY
Course Elapsed Days: 38
Plan Fractions Treated to Date: 29
Plan Prescribed Dose Per Fraction: 2 Gy
Plan Total Fractions Prescribed: 35
Plan Total Prescribed Dose: 70 Gy
Reference Point Dosage Given to Date: 58 Gy
Reference Point Session Dosage Given: 2 Gy
Session Number: 29

## 2022-03-24 ENCOUNTER — Other Ambulatory Visit: Payer: Self-pay

## 2022-03-24 ENCOUNTER — Inpatient Hospital Stay: Payer: Medicaid Other

## 2022-03-24 ENCOUNTER — Ambulatory Visit: Payer: Medicaid Other

## 2022-03-24 ENCOUNTER — Ambulatory Visit
Admission: RE | Admit: 2022-03-24 | Discharge: 2022-03-24 | Disposition: A | Discharge status: Home / Self Care | Payer: Medicaid Other | Source: Ambulatory Visit | Attending: Radiation Oncology | Admitting: Radiation Oncology

## 2022-03-24 VITALS — BP 125/73 | HR 72 | Temp 97.9°F | Resp 16

## 2022-03-24 DIAGNOSIS — Z5111 Encounter for antineoplastic chemotherapy: Secondary | ICD-10-CM | POA: Diagnosis not present

## 2022-03-24 DIAGNOSIS — Z95828 Presence of other vascular implants and grafts: Secondary | ICD-10-CM

## 2022-03-24 DIAGNOSIS — C155 Malignant neoplasm of lower third of esophagus: Secondary | ICD-10-CM

## 2022-03-24 LAB — RAD ONC ARIA SESSION SUMMARY
Course Elapsed Days: 39
Plan Fractions Treated to Date: 30
Plan Prescribed Dose Per Fraction: 2 Gy
Plan Total Fractions Prescribed: 35
Plan Total Prescribed Dose: 70 Gy
Reference Point Dosage Given to Date: 60 Gy
Reference Point Session Dosage Given: 2 Gy
Session Number: 30

## 2022-03-24 MED ORDER — SODIUM CHLORIDE 0.9 % IV SOLN: INTRAVENOUS | Status: DC

## 2022-03-24 MED ORDER — SODIUM CHLORIDE 0.9% FLUSH
10.0000 mL | Freq: Once | INTRAVENOUS | Status: AC
  Administered 2022-03-24: 10 mL

## 2022-03-24 MED ORDER — HEPARIN SOD (PORK) LOCK FLUSH 100 UNIT/ML IV SOLN
500.0000 [IU] | Freq: Once | INTRAVENOUS | Status: AC
  Administered 2022-03-24: 500 [IU]

## 2022-03-24 NOTE — Progress Notes (Signed)
Pt does not feel that he needs fluids tomorrow. Appointment cancelled per request.

## 2022-03-24 NOTE — Patient Instructions (Signed)
Rehydration, Adult Rehydration is the replacement of body fluids, salts, and minerals (electrolytes) that are lost during dehydration. Dehydration is when there is not enough water or other fluids in the body. This happens when you lose more fluids than you take in. Common causes of dehydration include: Not drinking enough fluids. This can occur when you are ill or doing activities that require a lot of energy, especially in hot weather. Conditions that cause loss of water or other fluids, such as diarrhea, vomiting, sweating, or urinating a lot. Other illnesses, such as fever or infection. Certain medicines, such as those that remove excess fluid from the body (diuretics). Symptoms of mild or moderate dehydration may include thirst, dry lips and mouth, and dizziness. Symptoms of severe dehydration may include increased heart rate, confusion, fainting, and not urinating. For severe dehydration, you may need to get fluids through an IV at the hospital. For mild or moderate dehydration, you can usually rehydrate at home by drinking certain fluids as told by your health care provider. What are the risks? Generally, rehydration is safe. However, taking in too much fluid (overhydration) can be a problem. This is rare. Overhydration can cause an electrolyte imbalance, kidney failure, or a decrease in salt (sodium) levels in the body. Supplies needed You will need an oral rehydration solution (ORS) if your health care provider tells you to use one. This is a drink to treat dehydration. It can be found in pharmacies and retail stores. How to rehydrate Fluids Follow instructions from your health care provider for rehydration. The kind of fluid and the amount you should drink depend on your condition. In general, you should choose drinks that you prefer. If told by your health care provider, drink an ORS. Make an ORS by following instructions on the package. Start by drinking small amounts, about  cup (120  mL) every 5-10 minutes. Slowly increase how much you drink until you have taken the amount recommended by your health care provider. Drink enough clear fluids to keep your urine pale yellow. If you were told to drink an ORS, finish it first, then start slowly drinking other clear fluids. Drink fluids such as: Water. This includes sparkling water and flavored water. Drinking only water can lead to having too little sodium in your body (hyponatremia). Follow the advice of your health care provider. Water from ice chips you suck on. Fruit juice with water you add to it (diluted). Sports drinks. Hot or cold herbal teas. Broth-based soups. Milk or milk products. Food Follow instructions from your health care provider about what to eat while you rehydrate. Your health care provider may recommend that you slowly begin eating regular foods in small amounts. Eat foods that contain a healthy balance of electrolytes, such as bananas, oranges, potatoes, tomatoes, and spinach. Avoid foods that are greasy or contain a lot of sugar. In some cases, you may get nutrition through a feeding tube that is passed through your nose and into your stomach (nasogastric tube, or NG tube). This may be done if you have uncontrolled vomiting or diarrhea. Beverages to avoid  Certain beverages may make dehydration worse. While you rehydrate, avoid drinking alcohol. How to tell if you are recovering from dehydration You may be recovering from dehydration if: You are urinating more often than before you started rehydrating. Your urine is pale yellow. Your energy level improves. You vomit less frequently. You have diarrhea less frequently. Your appetite improves or returns to normal. You feel less dizzy or less light-headed.   Your skin tone and color start to look more normal. Follow these instructions at home: Take over-the-counter and prescription medicines only as told by your health care provider. Do not take sodium  tablets. Doing this can lead to having too much sodium in your body (hypernatremia). Contact a health care provider if: You continue to have symptoms of mild or moderate dehydration, such as: Thirst. Dry lips. Slightly dry mouth. Dizziness. Dark urine or less urine than normal. Muscle cramps. You continue to vomit or have diarrhea. Get help right away if you: Have symptoms of dehydration that get worse. Have a fever. Have a severe headache. Have been vomiting and the following happens: Your vomiting gets worse or does not go away. Your vomit includes blood or green matter (bile). You cannot eat or drink without vomiting. Have problems with urination or bowel movements, such as: Diarrhea that gets worse or does not go away. Blood in your stool (feces). This may cause stool to look black and tarry. Not urinating, or urinating only a small amount of very dark urine, within 6-8 hours. Have trouble breathing. Have symptoms that get worse with treatment. These symptoms may represent a serious problem that is an emergency. Do not wait to see if the symptoms will go away. Get medical help right away. Call your local emergency services (911 in the [place] Do not drive yourself to the hospital. Summary Rehydration is the replacement of body fluids and minerals (electrolytes) that are lost during dehydration. Follow instructions from your health care provider for rehydration. The kind of fluid and amount you should drink depend on your condition. Slowly increase how much you drink until you have taken the amount recommended by your health care provider. Contact your health care provider if you continue to show signs of mild or moderate dehydration. This information is not intended to replace advice given to you by your health care provider. Make sure you discuss any questions you have with your health care provider. Document Revised: 09/17/2019 Document Reviewed: 07/28/2019 Elsevier Patient  Education  2023 [employer] Inc[employer]  

## 2022-03-25 ENCOUNTER — Inpatient Hospital Stay: Payer: Medicaid Other

## 2022-03-27 ENCOUNTER — Other Ambulatory Visit: Payer: Self-pay

## 2022-03-27 ENCOUNTER — Ambulatory Visit: Payer: Medicaid Other

## 2022-03-27 ENCOUNTER — Ambulatory Visit
Admission: RE | Admit: 2022-03-27 | Discharge: 2022-03-27 | Disposition: A | Discharge status: Home / Self Care | Payer: Medicaid Other | Source: Ambulatory Visit | Attending: Radiation Oncology | Admitting: Radiation Oncology

## 2022-03-27 DIAGNOSIS — Z5111 Encounter for antineoplastic chemotherapy: Secondary | ICD-10-CM | POA: Diagnosis not present

## 2022-03-27 LAB — RAD ONC ARIA SESSION SUMMARY
Course Elapsed Days: 42
Plan Fractions Treated to Date: 31
Plan Prescribed Dose Per Fraction: 2 Gy
Plan Total Fractions Prescribed: 35
Plan Total Prescribed Dose: 70 Gy
Reference Point Dosage Given to Date: 62 Gy
Reference Point Session Dosage Given: 2 Gy
Session Number: 31

## 2022-03-27 MED FILL — Dexamethasone Sodium Phosphate Inj 100 MG/10ML: INTRAMUSCULAR | Qty: 1 | Status: AC

## 2022-03-27 MED FILL — Fosaprepitant Dimeglumine For IV Infusion 150 MG (Base Eq): INTRAVENOUS | Qty: 5 | Status: AC
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
## 2022-03-28 ENCOUNTER — Encounter: Payer: Self-pay | Admitting: Hematology and Oncology

## 2022-03-28 ENCOUNTER — Inpatient Hospital Stay: Payer: Medicaid Other | Admitting: Hematology and Oncology

## 2022-03-28 ENCOUNTER — Inpatient Hospital Stay: Payer: Medicaid Other

## 2022-03-28 ENCOUNTER — Inpatient Hospital Stay: Payer: Medicaid Other | Admitting: Dietician

## 2022-03-28 ENCOUNTER — Other Ambulatory Visit: Payer: Self-pay

## 2022-03-28 ENCOUNTER — Ambulatory Visit
Admission: RE | Admit: 2022-03-28 | Discharge: 2022-03-28 | Disposition: A | Discharge status: Home / Self Care | Payer: Medicaid Other | Source: Ambulatory Visit | Attending: Radiation Oncology | Admitting: Radiation Oncology

## 2022-03-28 DIAGNOSIS — Z5111 Encounter for antineoplastic chemotherapy: Secondary | ICD-10-CM | POA: Diagnosis not present

## 2022-03-28 DIAGNOSIS — R634 Abnormal weight loss: Secondary | ICD-10-CM

## 2022-03-28 DIAGNOSIS — K1231 Oral mucositis (ulcerative) due to antineoplastic therapy: Secondary | ICD-10-CM

## 2022-03-28 DIAGNOSIS — C01 Malignant neoplasm of base of tongue: Secondary | ICD-10-CM

## 2022-03-28 DIAGNOSIS — C155 Malignant neoplasm of lower third of esophagus: Secondary | ICD-10-CM

## 2022-03-28 DIAGNOSIS — Z95828 Presence of other vascular implants and grafts: Secondary | ICD-10-CM

## 2022-03-28 LAB — RAD ONC ARIA SESSION SUMMARY
Course Elapsed Days: 43
Plan Fractions Treated to Date: 32
Plan Prescribed Dose Per Fraction: 2 Gy
Plan Total Fractions Prescribed: 35
Plan Total Prescribed Dose: 70 Gy
Reference Point Dosage Given to Date: 64 Gy
Reference Point Session Dosage Given: 2 Gy
Session Number: 32

## 2022-03-28 LAB — MAGNESIUM: Magnesium: 1.9 mg/dL (ref 1.7–2.4)

## 2022-03-28 LAB — BASIC METABOLIC PANEL - CANCER CENTER ONLY
Anion gap: 4 — ABNORMAL LOW (ref 5–15)
BUN: 19 mg/dL (ref 8–23)
CO2: 30 mmol/L (ref 22–32)
Calcium: 9.2 mg/dL (ref 8.9–10.3)
Chloride: 102 mmol/L (ref 98–111)
Creatinine: 0.53 mg/dL — ABNORMAL LOW (ref 0.61–1.24)
GFR, Estimated: >60 mL/min (ref >60)
Glucose, Bld: 105 mg/dL — ABNORMAL HIGH (ref 70–99)
Potassium: 4 mmol/L (ref 3.5–5.1)
Sodium: 136 mmol/L (ref 135–145)

## 2022-03-28 LAB — CBC WITH DIFFERENTIAL (CANCER CENTER ONLY)
Abs Immature Granulocytes: 0.02 10*3/uL (ref 0.00–0.07)
Basophils Absolute: 0 10*3/uL (ref 0.0–0.1)
Basophils Relative: 0 %
Eosinophils Absolute: 0 10*3/uL (ref 0.0–0.5)
Eosinophils Relative: 1 %
HCT: 34.9 % — ABNORMAL LOW (ref 39.0–52.0)
Hemoglobin: 12.3 g/dL — ABNORMAL LOW (ref 13.0–17.0)
Immature Granulocytes: 1 %
Lymphocytes Relative: 6 %
Lymphs Abs: 0.2 10*3/uL — ABNORMAL LOW (ref 0.7–4.0)
MCH: 31.5 pg (ref 26.0–34.0)
MCHC: 35.2 g/dL (ref 30.0–36.0)
MCV: 89.5 fL (ref 80.0–100.0)
Monocytes Absolute: 0.3 10*3/uL (ref 0.1–1.0)
Monocytes Relative: 9 %
Neutro Abs: 2.4 10*3/uL (ref 1.7–7.7)
Neutrophils Relative %: 83 %
Platelet Count: 125 10*3/uL — ABNORMAL LOW (ref 150–400)
RBC: 3.9 MIL/uL — ABNORMAL LOW (ref 4.22–5.81)
RDW: 14 % (ref 11.5–15.5)
WBC Count: 2.9 10*3/uL — ABNORMAL LOW (ref 4.0–10.5)
nRBC: 0 % (ref 0.0–0.2)

## 2022-03-28 MED ORDER — PALONOSETRON HCL INJECTION 0.25 MG/5ML
0.2500 mg | Freq: Once | INTRAVENOUS | Status: AC
  Administered 2022-03-28: 0.25 mg via INTRAVENOUS
  Filled 2022-03-28: qty 5

## 2022-03-28 MED ORDER — MAGNESIUM SULFATE 2 GM/50ML IV SOLN
2.0000 g | Freq: Once | INTRAVENOUS | Status: AC
  Administered 2022-03-28: 2 g via INTRAVENOUS
  Filled 2022-03-28: qty 50

## 2022-03-28 MED ORDER — SODIUM CHLORIDE 0.9% FLUSH
10.0000 mL | INTRAVENOUS | Status: DC | PRN
  Administered 2022-03-28: 10 mL

## 2022-03-28 MED ORDER — SODIUM CHLORIDE 0.9 % IV SOLN
10.0000 mg | Freq: Once | INTRAVENOUS | Status: AC
  Administered 2022-03-28: 10 mg via INTRAVENOUS
  Filled 2022-03-28: qty 10

## 2022-03-28 MED ORDER — POTASSIUM CHLORIDE IN NACL 20-0.9 MEQ/L-% IV SOLN
Freq: Once | INTRAVENOUS | Status: AC
  Filled 2022-03-28: qty 1000

## 2022-03-28 MED ORDER — SODIUM CHLORIDE 0.9 % IV SOLN
150.0000 mg | Freq: Once | INTRAVENOUS | Status: AC
  Administered 2022-03-28: 150 mg via INTRAVENOUS
  Filled 2022-03-28: qty 150

## 2022-03-28 MED ORDER — HEPARIN SOD (PORK) LOCK FLUSH 100 UNIT/ML IV SOLN
500.0000 [IU] | Freq: Once | INTRAVENOUS | Status: AC | PRN
  Administered 2022-03-28: 500 [IU]

## 2022-03-28 MED ORDER — SODIUM CHLORIDE 0.9 % IV SOLN
40.0000 mg/m2 | Freq: Once | INTRAVENOUS | Status: AC
  Administered 2022-03-28: 61 mg via INTRAVENOUS
  Filled 2022-03-28: qty 61

## 2022-03-28 MED ORDER — SODIUM CHLORIDE 0.9 % IV SOLN: Freq: Once | INTRAVENOUS | Status: AC

## 2022-03-28 MED ORDER — SODIUM CHLORIDE 0.9% FLUSH
10.0000 mL | Freq: Once | INTRAVENOUS | Status: AC
  Administered 2022-03-28: 10 mL

## 2022-03-28 NOTE — Patient Instructions (Addendum)
[facility] MEDICAL ONCOLOGY  Discharge Instructions: Thank you for choosing [facility] to provide your oncology and hematology care.   If you have a lab appointment with the Cancer Center, please go directly to the Cancer Center and check in at the registration area.   Wear comfortable clothing and clothing appropriate for easy access to any Portacath or PICC line.   We strive to give you quality time with your provider. You may need to reschedule your appointment if you arrive late (15 or more minutes).  Arriving late affects you and other patients whose appointments are after yours.  Also, if you miss three or more appointments without notifying the office, you may be dismissed from the clinic at the provider's discretion.      For prescription refill requests, have your pharmacy contact our office and allow 72 hours for refills to be completed.    Today you received the following chemotherapy and/or immunotherapy agents: Cisplatin.       To help prevent nausea and vomiting after your treatment, we encourage you to take your nausea medication as directed.  BELOW ARE SYMPTOMS THAT SHOULD BE REPORTED IMMEDIATELY: *FEVER GREATER THAN 100.4 F (38 C) OR HIGHER *CHILLS OR SWEATING *NAUSEA AND VOMITING THAT IS NOT CONTROLLED WITH YOUR NAUSEA MEDICATION *UNUSUAL SHORTNESS OF BREATH *UNUSUAL BRUISING OR BLEEDING *URINARY PROBLEMS (pain or burning when urinating, or frequent urination) *BOWEL PROBLEMS (unusual diarrhea, constipation, pain near the anus) TENDERNESS IN MOUTH AND THROAT WITH OR WITHOUT PRESENCE OF ULCERS (sore throat, sores in mouth, or a toothache) UNUSUAL RASH, SWELLING OR PAIN  UNUSUAL VAGINAL DISCHARGE OR ITCHING   Items with * indicate a potential emergency and should be followed up as soon as possible or go to the Emergency Department if any problems should occur.  Please show the CHEMOTHERAPY ALERT CARD or IMMUNOTHERAPY ALERT CARD at check-in  to the Emergency Department and triage nurse.  Should you have questions after your visit or need to cancel or reschedule your appointment, please contact [facility]  Dept: [phone]  and follow the prompts.  Office hours are 8:00 a.m. to 4:30 p.m. Monday - Friday. Please note that voicemails left after 4:00 p.m. may not be returned until the following business day.  We are closed weekends and major holidays. You have access to a nurse at all times for urgent questions. Please call the main number to the clinic Dept: [phone] and follow the prompts.   For any non-urgent questions, you may also contact your provider using MyChart. We now offer e-Visits for anyone 18 and older to request care online for non-urgent symptoms. For details visit [email or web address].   Also download the MyChart app! Go to the app store, search "MyChart", open the app, select [facility], and log in with your MyChart username and password.  Masks are optional in the cancer centers. If you would like for your care team to wear a mask while they are taking care of you, please let them know. You may have one support Kiasia Chou who is at least 63 years old accompany you for your appointments. 

## 2022-03-28 NOTE — Progress Notes (Signed)
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 S/p esophagectomy in 2018 for esophageal cancer.  8/24 - pt met with Dr. Bobbye Morton for consult/discussion of J-tube placement. Noted pt preference to delay placement   Met with patient in waiting area prior to infusion. He reports altered taste. Says he is having a hard time finding foods that taste good. He has been eating ice cream. This is bland. Patient is drinking 1-2 Ensure, reports this makes him feel nauseas at times. He is drinking ~1500 ml water. Patient is asking for Pedialyte hydration powder samples. He likes these. Patient reports his neck is red and irritated.   Medications: reviewed   Labs: glucose 105, Cr 0.53  Anthropometrics: Weight 115 lb 11.2 oz today decreased from 118 lbs 6.4 oz on 8/21.   2.5% in 7 days; significant   NUTRITION DIAGNOSIS: Inadequate oral intake ongoing   INTERVENTION:  Reinforced education on soft moist foods and drinking supplements to meet needs Patient will work to increase Ensure, recommend 3 Ensure Plus HP  Reviewed strategies for altered taste. Continue baking soda salt water rinses several times daily before meals     MONITORING, EVALUATION, GOAL: weight trends, intake   NEXT VISIT: Tuesday September 5 during infusion

## 2022-03-28 NOTE — Assessment & Plan Note (Signed)
He has lost about 3 pounds of weight since his last visit.  I encouraged him to take some energy drinks such as Ensure or boost as tolerated along with soft foods.  We will continue to monitor his weight.

## 2022-03-28 NOTE — Assessment & Plan Note (Signed)
Moderate mucositis with ulceration.  I recommended as needed pain medication.  He is currently able to manage it without having to take any pain medication.  We will continue to monitor this.

## 2022-03-28 NOTE — Assessment & Plan Note (Signed)
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  He is scheduled to receive his cycle 5 he is overall doing very well.  Today is his last weekly cisplatin.  No dose-limiting toxicity noted.  He reports some pain in the area of radiation with some skin desquamation.  He was advised to apply triple antibiotic cream.  He also has some mucositis but denies need for pain medication.  He will proceed with radiation as planned, last day of radiation is this Friday.  He will return to clinic in about 4 weeks for follow-up.  We will not plan J-tube for him since he is almost done with treatment and he has overall maintained his weight well.

## 2022-03-29 ENCOUNTER — Other Ambulatory Visit: Payer: Self-pay

## 2022-03-29 ENCOUNTER — Inpatient Hospital Stay: Payer: Medicaid Other

## 2022-03-29 ENCOUNTER — Ambulatory Visit
Admission: RE | Admit: 2022-03-29 | Discharge: 2022-03-29 | Disposition: A | Discharge status: Home / Self Care | Payer: Medicaid Other | Source: Ambulatory Visit | Attending: Radiation Oncology | Admitting: Radiation Oncology

## 2022-03-29 VITALS — BP 123/74 | HR 77 | Temp 98.3°F | Resp 17

## 2022-03-29 DIAGNOSIS — Z95828 Presence of other vascular implants and grafts: Secondary | ICD-10-CM

## 2022-03-29 DIAGNOSIS — Z5111 Encounter for antineoplastic chemotherapy: Secondary | ICD-10-CM | POA: Diagnosis not present

## 2022-03-29 DIAGNOSIS — C155 Malignant neoplasm of lower third of esophagus: Secondary | ICD-10-CM

## 2022-03-29 LAB — RAD ONC ARIA SESSION SUMMARY
Course Elapsed Days: 44
Plan Fractions Treated to Date: 33
Plan Prescribed Dose Per Fraction: 2 Gy
Plan Total Fractions Prescribed: 35
Plan Total Prescribed Dose: 70 Gy
Reference Point Dosage Given to Date: 66 Gy
Reference Point Session Dosage Given: 2 Gy
Session Number: 33

## 2022-03-29 MED ORDER — SODIUM CHLORIDE 0.9% FLUSH
10.0000 mL | Freq: Once | INTRAVENOUS | Status: AC
  Administered 2022-03-29: 10 mL

## 2022-03-29 MED ORDER — SODIUM CHLORIDE 0.9 % IV SOLN: INTRAVENOUS | Status: DC

## 2022-03-29 MED ORDER — HEPARIN SOD (PORK) LOCK FLUSH 100 UNIT/ML IV SOLN
500.0000 [IU] | Freq: Once | INTRAVENOUS | Status: AC
  Administered 2022-03-29: 500 [IU]

## 2022-03-29 NOTE — Patient Instructions (Signed)
Rehydration, Adult Rehydration is the replacement of body fluids, salts, and minerals (electrolytes) that are lost during dehydration. Dehydration is when there is not enough water or other fluids in the body. This happens when you lose more fluids than you take in. Common causes of dehydration include: Not drinking enough fluids. This can occur when you are ill or doing activities that require a lot of energy, especially in hot weather. Conditions that cause loss of water or other fluids, such as diarrhea, vomiting, sweating, or urinating a lot. Other illnesses, such as fever or infection. Certain medicines, such as those that remove excess fluid from the body (diuretics). Symptoms of mild or moderate dehydration may include thirst, dry lips and mouth, and dizziness. Symptoms of severe dehydration may include increased heart rate, confusion, fainting, and not urinating. For severe dehydration, you may need to get fluids through an IV at the hospital. For mild or moderate dehydration, you can usually rehydrate at home by drinking certain fluids as told by your health care provider. What are the risks? Generally, rehydration is safe. However, taking in too much fluid (overhydration) can be a problem. This is rare. Overhydration can cause an electrolyte imbalance, kidney failure, or a decrease in salt (sodium) levels in the body. Supplies needed You will need an oral rehydration solution (ORS) if your health care provider tells you to use one. This is a drink to treat dehydration. It can be found in pharmacies and retail stores. How to rehydrate Fluids Follow instructions from your health care provider for rehydration. The kind of fluid and the amount you should drink depend on your condition. In general, you should choose drinks that you prefer. If told by your health care provider, drink an ORS. Make an ORS by following instructions on the package. Start by drinking small amounts, about  cup (120  mL) every 5-10 minutes. Slowly increase how much you drink until you have taken the amount recommended by your health care provider. Drink enough clear fluids to keep your urine pale yellow. If you were told to drink an ORS, finish it first, then start slowly drinking other clear fluids. Drink fluids such as: Water. This includes sparkling water and flavored water. Drinking only water can lead to having too little sodium in your body (hyponatremia). Follow the advice of your health care provider. Water from ice chips you suck on. Fruit juice with water you add to it (diluted). Sports drinks. Hot or cold herbal teas. Broth-based soups. Milk or milk products. Food Follow instructions from your health care provider about what to eat while you rehydrate. Your health care provider may recommend that you slowly begin eating regular foods in small amounts. Eat foods that contain a healthy balance of electrolytes, such as bananas, oranges, potatoes, tomatoes, and spinach. Avoid foods that are greasy or contain a lot of sugar. In some cases, you may get nutrition through a feeding tube that is passed through your nose and into your stomach (nasogastric tube, or NG tube). This may be done if you have uncontrolled vomiting or diarrhea. Beverages to avoid  Certain beverages may make dehydration worse. While you rehydrate, avoid drinking alcohol. How to tell if you are recovering from dehydration You may be recovering from dehydration if: You are urinating more often than before you started rehydrating. Your urine is pale yellow. Your energy level improves. You vomit less frequently. You have diarrhea less frequently. Your appetite improves or returns to normal. You feel less dizzy or less light-headed.   Your skin tone and color start to look more normal. Follow these instructions at home: Take over-the-counter and prescription medicines only as told by your health care provider. Do not take sodium  tablets. Doing this can lead to having too much sodium in your body (hypernatremia). Contact a health care provider if: You continue to have symptoms of mild or moderate dehydration, such as: Thirst. Dry lips. Slightly dry mouth. Dizziness. Dark urine or less urine than normal. Muscle cramps. You continue to vomit or have diarrhea. Get help right away if you: Have symptoms of dehydration that get worse. Have a fever. Have a severe headache. Have been vomiting and the following happens: Your vomiting gets worse or does not go away. Your vomit includes blood or green matter (bile). You cannot eat or drink without vomiting. Have problems with urination or bowel movements, such as: Diarrhea that gets worse or does not go away. Blood in your stool (feces). This may cause stool to look black and tarry. Not urinating, or urinating only a small amount of very dark urine, within 6-8 hours. Have trouble breathing. Have symptoms that get worse with treatment. These symptoms may represent a serious problem that is an emergency. Do not wait to see if the symptoms will go away. Get medical help right away. Call your local emergency services (911 in the [place] Do not drive yourself to the hospital. Summary Rehydration is the replacement of body fluids and minerals (electrolytes) that are lost during dehydration. Follow instructions from your health care provider for rehydration. The kind of fluid and amount you should drink depend on your condition. Slowly increase how much you drink until you have taken the amount recommended by your health care provider. Contact your health care provider if you continue to show signs of mild or moderate dehydration. This information is not intended to replace advice given to you by your health care provider. Make sure you discuss any questions you have with your health care provider. Document Revised: 09/17/2019 Document Reviewed: 07/28/2019 Elsevier Patient  Education  2023 [employer] Inc[employer]  

## 2022-03-30 ENCOUNTER — Inpatient Hospital Stay: Payer: Medicaid Other

## 2022-03-30 ENCOUNTER — Other Ambulatory Visit: Payer: Self-pay

## 2022-03-30 ENCOUNTER — Telehealth: Payer: Self-pay | Admitting: *Deleted

## 2022-03-30 ENCOUNTER — Ambulatory Visit
Admission: RE | Admit: 2022-03-30 | Discharge: 2022-03-30 | Disposition: A | Discharge status: Home / Self Care | Payer: Medicaid Other | Source: Ambulatory Visit | Attending: Radiation Oncology | Admitting: Radiation Oncology

## 2022-03-30 DIAGNOSIS — Z5111 Encounter for antineoplastic chemotherapy: Secondary | ICD-10-CM | POA: Diagnosis not present

## 2022-03-30 LAB — RAD ONC ARIA SESSION SUMMARY
Course Elapsed Days: 45
Plan Fractions Treated to Date: 34
Plan Prescribed Dose Per Fraction: 2 Gy
Plan Total Fractions Prescribed: 35
Plan Total Prescribed Dose: 70 Gy
Reference Point Dosage Given to Date: 68 Gy
Reference Point Session Dosage Given: 2 Gy
Session Number: 34

## 2022-03-31 ENCOUNTER — Ambulatory Visit
Admission: RE | Admit: 2022-03-31 | Discharge: 2022-03-31 | Disposition: A | Discharge status: Home / Self Care | Payer: Medicaid Other | Source: Ambulatory Visit | Attending: Radiation Oncology | Admitting: Radiation Oncology

## 2022-03-31 ENCOUNTER — Encounter: Payer: Self-pay | Admitting: Radiation Oncology

## 2022-03-31 ENCOUNTER — Other Ambulatory Visit: Payer: Self-pay

## 2022-03-31 ENCOUNTER — Inpatient Hospital Stay: Payer: Medicaid Other

## 2022-03-31 ENCOUNTER — Ambulatory Visit
Admission: RE | Admit: 2022-03-31 | Discharge: 2022-03-31 | Disposition: A | Discharge status: Home / Self Care | Payer: Medicaid Other | Source: Ambulatory Visit | Attending: Hematology and Oncology | Admitting: Hematology and Oncology

## 2022-03-31 DIAGNOSIS — C770 Secondary and unspecified malignant neoplasm of lymph nodes of head, face and neck: Secondary | ICD-10-CM | POA: Insufficient documentation

## 2022-03-31 DIAGNOSIS — C760 Malignant neoplasm of head, face and neck: Secondary | ICD-10-CM | POA: Insufficient documentation

## 2022-03-31 LAB — RAD ONC ARIA SESSION SUMMARY
Course Elapsed Days: 46
Plan Fractions Treated to Date: 35
Plan Prescribed Dose Per Fraction: 2 Gy
Plan Total Fractions Prescribed: 35
Plan Total Prescribed Dose: 70 Gy
Reference Point Dosage Given to Date: 70 Gy
Reference Point Session Dosage Given: 2 Gy
Session Number: 35

## 2022-03-31 NOTE — Progress Notes (Signed)
Oncology Nurse Navigator Documentation   Met with Darrell Cunningham, his daughter, and his mother after final RT to offer support and to celebrate end of radiation treatment.   Provided verbal/written post-RT guidance: Importance of keeping all follow-up appts, especially those with Nutrition and SLP. Importance of protecting treatment area from sun. Continuation of Sonafine application 2-3 times daily, application of antibiotic ointment to areas of raw skin; when supply of Sonafine exhausted transition to OTC lotion with vitamin E. Provided/reviewed Epic calendar of upcoming appts. Explained my role as navigator will continue for several more months, encouraged him to call me with needs/concerns.    [name] RN, BSN, OCN Head & Neck Oncology Nurse [facility] Phone # [phone]  Fax # [phone]
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
## 2022-04-04 ENCOUNTER — Inpatient Hospital Stay: Payer: Medicaid Other | Admitting: Dietician

## 2022-04-04 ENCOUNTER — Inpatient Hospital Stay: Payer: Medicaid Other | Admitting: Adult Health

## 2022-04-04 ENCOUNTER — Inpatient Hospital Stay: Payer: Medicaid Other | Attending: Physician Assistant

## 2022-04-04 ENCOUNTER — Other Ambulatory Visit (HOSPITAL_COMMUNITY): Payer: Self-pay

## 2022-04-04 ENCOUNTER — Other Ambulatory Visit: Payer: Self-pay

## 2022-04-04 ENCOUNTER — Encounter: Payer: Self-pay | Admitting: Adult Health

## 2022-04-04 ENCOUNTER — Encounter: Payer: Self-pay | Admitting: Hematology

## 2022-04-04 ENCOUNTER — Encounter: Payer: Self-pay | Admitting: Hematology and Oncology

## 2022-04-04 ENCOUNTER — Inpatient Hospital Stay: Payer: Medicaid Other

## 2022-04-04 VITALS — BP 105/56 | HR 119 | Temp 97.3°F | Resp 17 | Wt 109.7 lb

## 2022-04-04 VITALS — BP 101/57 | HR 109 | Resp 18

## 2022-04-04 DIAGNOSIS — Z880 Allergy status to penicillin: Secondary | ICD-10-CM | POA: Insufficient documentation

## 2022-04-04 DIAGNOSIS — Z5986 Financial insecurity: Secondary | ICD-10-CM | POA: Insufficient documentation

## 2022-04-04 DIAGNOSIS — Z95828 Presence of other vascular implants and grafts: Secondary | ICD-10-CM

## 2022-04-04 DIAGNOSIS — Z87891 Personal history of nicotine dependence: Secondary | ICD-10-CM | POA: Diagnosis not present

## 2022-04-04 DIAGNOSIS — R5383 Other fatigue: Secondary | ICD-10-CM | POA: Diagnosis not present

## 2022-04-04 DIAGNOSIS — Z923 Personal history of irradiation: Secondary | ICD-10-CM | POA: Insufficient documentation

## 2022-04-04 DIAGNOSIS — J029 Acute pharyngitis, unspecified: Secondary | ICD-10-CM | POA: Insufficient documentation

## 2022-04-04 DIAGNOSIS — R634 Abnormal weight loss: Secondary | ICD-10-CM | POA: Diagnosis not present

## 2022-04-04 DIAGNOSIS — C155 Malignant neoplasm of lower third of esophagus: Secondary | ICD-10-CM

## 2022-04-04 DIAGNOSIS — C01 Malignant neoplasm of base of tongue: Secondary | ICD-10-CM | POA: Diagnosis present

## 2022-04-04 DIAGNOSIS — Z803 Family history of malignant neoplasm of breast: Secondary | ICD-10-CM | POA: Diagnosis not present

## 2022-04-04 DIAGNOSIS — T451X5A Adverse effect of antineoplastic and immunosuppressive drugs, initial encounter: Secondary | ICD-10-CM | POA: Diagnosis not present

## 2022-04-04 DIAGNOSIS — I700 Atherosclerosis of aorta: Secondary | ICD-10-CM | POA: Insufficient documentation

## 2022-04-04 DIAGNOSIS — Z9049 Acquired absence of other specified parts of digestive tract: Secondary | ICD-10-CM | POA: Insufficient documentation

## 2022-04-04 DIAGNOSIS — Z79899 Other long term (current) drug therapy: Secondary | ICD-10-CM | POA: Insufficient documentation

## 2022-04-04 DIAGNOSIS — Z800 Family history of malignant neoplasm of digestive organs: Secondary | ICD-10-CM | POA: Diagnosis not present

## 2022-04-04 DIAGNOSIS — Z8041 Family history of malignant neoplasm of ovary: Secondary | ICD-10-CM | POA: Insufficient documentation

## 2022-04-04 DIAGNOSIS — Z5111 Encounter for antineoplastic chemotherapy: Secondary | ICD-10-CM | POA: Diagnosis present

## 2022-04-04 DIAGNOSIS — J449 Chronic obstructive pulmonary disease, unspecified: Secondary | ICD-10-CM | POA: Diagnosis not present

## 2022-04-04 DIAGNOSIS — Z836 Family history of other diseases of the respiratory system: Secondary | ICD-10-CM | POA: Diagnosis not present

## 2022-04-04 DIAGNOSIS — K1231 Oral mucositis (ulcerative) due to antineoplastic therapy: Secondary | ICD-10-CM | POA: Insufficient documentation

## 2022-04-04 DIAGNOSIS — Z9221 Personal history of antineoplastic chemotherapy: Secondary | ICD-10-CM | POA: Diagnosis not present

## 2022-04-04 DIAGNOSIS — E860 Dehydration: Secondary | ICD-10-CM | POA: Diagnosis not present

## 2022-04-04 LAB — CBC WITH DIFFERENTIAL (CANCER CENTER ONLY)
Abs Immature Granulocytes: 0.01 10*3/uL (ref 0.00–0.07)
Basophils Absolute: 0 10*3/uL (ref 0.0–0.1)
Basophils Relative: 1 %
Eosinophils Absolute: 0 10*3/uL (ref 0.0–0.5)
Eosinophils Relative: 1 %
HCT: 34.8 % — ABNORMAL LOW (ref 39.0–52.0)
Hemoglobin: 12.3 g/dL — ABNORMAL LOW (ref 13.0–17.0)
Immature Granulocytes: 1 %
Lymphocytes Relative: 9 %
Lymphs Abs: 0.1 10*3/uL — ABNORMAL LOW (ref 0.7–4.0)
MCH: 31.5 pg (ref 26.0–34.0)
MCHC: 35.3 g/dL (ref 30.0–36.0)
MCV: 89.2 fL (ref 80.0–100.0)
Monocytes Absolute: 0.2 10*3/uL (ref 0.1–1.0)
Monocytes Relative: 16 %
Neutro Abs: 1 10*3/uL — ABNORMAL LOW (ref 1.7–7.7)
Neutrophils Relative %: 72 %
Platelet Count: 145 10*3/uL — ABNORMAL LOW (ref 150–400)
RBC: 3.9 MIL/uL — ABNORMAL LOW (ref 4.22–5.81)
RDW: 14.1 % (ref 11.5–15.5)
WBC Count: 1.4 10*3/uL — ABNORMAL LOW (ref 4.0–10.5)
nRBC: 0 % (ref 0.0–0.2)

## 2022-04-04 LAB — BASIC METABOLIC PANEL - CANCER CENTER ONLY
Anion gap: 5 (ref 5–15)
BUN: 21 mg/dL (ref 8–23)
CO2: 29 mmol/L (ref 22–32)
Calcium: 9.2 mg/dL (ref 8.9–10.3)
Chloride: 100 mmol/L (ref 98–111)
Creatinine: 0.65 mg/dL (ref 0.61–1.24)
GFR, Estimated: >60 mL/min (ref >60)
Glucose, Bld: 136 mg/dL — ABNORMAL HIGH (ref 70–99)
Potassium: 4.5 mmol/L (ref 3.5–5.1)
Sodium: 134 mmol/L — ABNORMAL LOW (ref 135–145)

## 2022-04-04 LAB — MAGNESIUM: Magnesium: 1.8 mg/dL (ref 1.7–2.4)

## 2022-04-04 MED ORDER — SODIUM CHLORIDE 0.9% FLUSH
10.0000 mL | Freq: Once | INTRAVENOUS | Status: AC
  Administered 2022-04-04: 10 mL

## 2022-04-04 MED ORDER — HEPARIN SOD (PORK) LOCK FLUSH 100 UNIT/ML IV SOLN
500.0000 [IU] | Freq: Once | INTRAVENOUS | Status: AC
  Administered 2022-04-04: 500 [IU]

## 2022-04-04 MED ORDER — SODIUM CHLORIDE 0.9 % IV SOLN: INTRAVENOUS | Status: DC

## 2022-04-04 NOTE — Patient Instructions (Signed)
Rehydration, Adult Rehydration is the replacement of body fluids, salts, and minerals (electrolytes) that are lost during dehydration. Dehydration is when there is not enough water or other fluids in the body. This happens when you lose more fluids than you take in. Common causes of dehydration include: Not drinking enough fluids. This can occur when you are ill or doing activities that require a lot of energy, especially in hot weather. Conditions that cause loss of water or other fluids, such as diarrhea, vomiting, sweating, or urinating a lot. Other illnesses, such as fever or infection. Certain medicines, such as those that remove excess fluid from the body (diuretics). Symptoms of mild or moderate dehydration may include thirst, dry lips and mouth, and dizziness. Symptoms of severe dehydration may include increased heart rate, confusion, fainting, and not urinating. For severe dehydration, you may need to get fluids through an IV at the hospital. For mild or moderate dehydration, you can usually rehydrate at home by drinking certain fluids as told by your health care provider. What are the risks? Generally, rehydration is safe. However, taking in too much fluid (overhydration) can be a problem. This is rare. Overhydration can cause an electrolyte imbalance, kidney failure, or a decrease in salt (sodium) levels in the body. Supplies needed You will need an oral rehydration solution (ORS) if your health care provider tells you to use one. This is a drink to treat dehydration. It can be found in pharmacies and retail stores. How to rehydrate Fluids Follow instructions from your health care provider for rehydration. The kind of fluid and the amount you should drink depend on your condition. In general, you should choose drinks that you prefer. If told by your health care provider, drink an ORS. Make an ORS by following instructions on the package. Start by drinking small amounts, about  cup (120  mL) every 5-10 minutes. Slowly increase how much you drink until you have taken the amount recommended by your health care provider. Drink enough clear fluids to keep your urine pale yellow. If you were told to drink an ORS, finish it first, then start slowly drinking other clear fluids. Drink fluids such as: Water. This includes sparkling water and flavored water. Drinking only water can lead to having too little sodium in your body (hyponatremia). Follow the advice of your health care provider. Water from ice chips you suck on. Fruit juice with water you add to it (diluted). Sports drinks. Hot or cold herbal teas. Broth-based soups. Milk or milk products. Food Follow instructions from your health care provider about what to eat while you rehydrate. Your health care provider may recommend that you slowly begin eating regular foods in small amounts. Eat foods that contain a healthy balance of electrolytes, such as bananas, oranges, potatoes, tomatoes, and spinach. Avoid foods that are greasy or contain a lot of sugar. In some cases, you may get nutrition through a feeding tube that is passed through your nose and into your stomach (nasogastric tube, or NG tube). This may be done if you have uncontrolled vomiting or diarrhea. Beverages to avoid  Certain beverages may make dehydration worse. While you rehydrate, avoid drinking alcohol. How to tell if you are recovering from dehydration You may be recovering from dehydration if: You are urinating more often than before you started rehydrating. Your urine is pale yellow. Your energy level improves. You vomit less frequently. You have diarrhea less frequently. Your appetite improves or returns to normal. You feel less dizzy or less light-headed.   Your skin tone and color start to look more normal. Follow these instructions at home: Take over-the-counter and prescription medicines only as told by your health care provider. Do not take sodium  tablets. Doing this can lead to having too much sodium in your body (hypernatremia). Contact a health care provider if: You continue to have symptoms of mild or moderate dehydration, such as: Thirst. Dry lips. Slightly dry mouth. Dizziness. Dark urine or less urine than normal. Muscle cramps. You continue to vomit or have diarrhea. Get help right away if you: Have symptoms of dehydration that get worse. Have a fever. Have a severe headache. Have been vomiting and the following happens: Your vomiting gets worse or does not go away. Your vomit includes blood or green matter (bile). You cannot eat or drink without vomiting. Have problems with urination or bowel movements, such as: Diarrhea that gets worse or does not go away. Blood in your stool (feces). This may cause stool to look black and tarry. Not urinating, or urinating only a small amount of very dark urine, within 6-8 hours. Have trouble breathing. Have symptoms that get worse with treatment. These symptoms may represent a serious problem that is an emergency. Do not wait to see if the symptoms will go away. Get medical help right away. Call your local emergency services (911 in the [place] Do not drive yourself to the hospital. Summary Rehydration is the replacement of body fluids and minerals (electrolytes) that are lost during dehydration. Follow instructions from your health care provider for rehydration. The kind of fluid and amount you should drink depend on your condition. Slowly increase how much you drink until you have taken the amount recommended by your health care provider. Contact your health care provider if you continue to show signs of mild or moderate dehydration. This information is not intended to replace advice given to you by your health care provider. Make sure you discuss any questions you have with your health care provider. Document Revised: 09/17/2019 Document Reviewed: 07/28/2019 Elsevier Patient  Education  2023 [employer] Inc[employer]  

## 2022-04-04 NOTE — Assessment & Plan Note (Signed)
Waldron is here today for f/u after completing concurrent chemoradiation with cisplatin.  He is struggling with sore throat and fatigue.    He is slightly dehydrated and I recommend that he receive one liter of IV fluids.    He has a decreased WBC of 1.4, and I reviewed neutropenic precautions with him today.  He has not had any signs of infection.  He will f/u with Dory Peru, Garald Balding, Dr. Isidore Moos as scheduled.  His f/u with Dr. Chryl Heck is on 04/26/2022.

## 2022-04-04 NOTE — Progress Notes (Signed)
Nutrition Follow-up:  Patient has completed concurrent chemoradiation with weekly cisplatin for left cervical lymph node cancer (final radiation on 9/1). 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   Patient does not have J-tube.   Met with patient in infusion. He is receiving IVF today. Daughter is present for visit. Patient reports sore throat continues. Says he is coughing up stuff all day and night. He is doing baking soda salt water rinses several times daily. Patient is tired. He is eating soft moist foods (cereal with whole milk, hamburger in brown gravy, ice cream, soups, eggs). Patient unable to taste anything. He is drinking one Ensure. Patient reports this makes him nauseas and unable to drink more of these. Patient denies vomiting. His last bowel movement was Sunday after "days" of nothing. He is  drinking a "jug" of water daily. Patient adds a couple packets of Pedialyte hydration powders to this. He is asking for more samples. Patient appreciative of popsicle brought and agreeable to drink strawberry Ensure Complete during infusion.    Medications: reviewed   Labs: Na 134, Glucose 136  Anthropometrics: Weight 109 lb 11.2 oz today decreased 5% in one week. This is severe  8/29 - 115 lb 11.2 oz 8/21 - 118 lb 6.4 oz    NUTRITION DIAGNOSIS: Inadequate oral intake continues    INTERVENTION:  Encouraged soft moist foods that are high in calories and protein Patient has poor tolerance to Ensure/Boost. Provided samples of CIB - instructed to mix with 1 cup whole milk TID Discussed strategies for dry mouth/thick saliva - suggested trying ginger ale rinse, handout with tips provided Continue baking soda salt water rinses  Discussed strategies for constipation - handout with tips provided     MONITORING, EVALUATION, GOAL: Weight trends, intake   NEXT VISIT: Monday September 18 after MD with Pamala Hurry

## 2022-04-10 ENCOUNTER — Other Ambulatory Visit: Payer: Self-pay | Admitting: *Deleted

## 2022-04-10 ENCOUNTER — Inpatient Hospital Stay (HOSPITAL_BASED_OUTPATIENT_CLINIC_OR_DEPARTMENT_OTHER): Payer: Medicaid Other

## 2022-04-10 ENCOUNTER — Encounter: Payer: Medicaid Other | Admitting: Nutrition

## 2022-04-10 ENCOUNTER — Other Ambulatory Visit: Payer: Self-pay

## 2022-04-10 ENCOUNTER — Telehealth: Payer: Self-pay | Admitting: *Deleted

## 2022-04-10 VITALS — BP 97/66 | HR 97 | Temp 98.0°F | Resp 17

## 2022-04-10 DIAGNOSIS — Z95828 Presence of other vascular implants and grafts: Secondary | ICD-10-CM

## 2022-04-10 DIAGNOSIS — C155 Malignant neoplasm of lower third of esophagus: Secondary | ICD-10-CM

## 2022-04-10 MED ORDER — SODIUM CHLORIDE 0.9% FLUSH
10.0000 mL | Freq: Once | INTRAVENOUS | Status: AC
  Administered 2022-04-10: 10 mL

## 2022-04-10 MED ORDER — SODIUM CHLORIDE 0.9 % IV SOLN: INTRAVENOUS | Status: DC

## 2022-04-10 MED ORDER — HEPARIN SOD (PORK) LOCK FLUSH 100 UNIT/ML IV SOLN
500.0000 [IU] | Freq: Once | INTRAVENOUS | Status: AC
  Administered 2022-04-10: 500 [IU]

## 2022-04-10 NOTE — Telephone Encounter (Signed)
Trung left a VM stating request to come in for IVF's today- upon returning his call he stated he is "on his way up here".  This RN was able per discussion with infusion room charge nurse for appt today for fluids.  This RN verified orders are present under his Supportive Treatment plan.  This RN will follow up with pt for possible further appts needed this week for fluids.

## 2022-04-11 ENCOUNTER — Encounter: Payer: Self-pay | Admitting: Hematology

## 2022-04-11 NOTE — Progress Notes (Signed)
                                                                                                                                                             Patient Name: Darrell Cunningham MRN: [id number] DOB: [birth year] Referring Physician: [name] (Profile Not Attached) Date of Service: 03/31/2022 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  Cancer Staging:  Cancer Staging  Malignant neoplasm of base of tongue ([place]) Staging form: Pharynx - HPV-Mediated Oropharynx, AJCC 8th Edition - Clinical stage from 02/20/2022: Stage I (cT1, cN1, cM0, p16+) - Signed by Eppie Gibson, MD on 02/20/2022 Stage prefix: Initial diagnosis  Malignant neoplasm of lower third of esophagus [facility]) Staging form: Esophagus - Adenocarcinoma, AJCC 8th Edition - Clinical stage from 10/26/2016: Stage III (cT3, cN1, cM0) - Signed by [name], MD on 11/05/2016 - Pathologic stage from 04/20/2017: Stage I (ypT0, pN0, cM0, GX) - Signed by [name], MD on 04/23/2017 Stage prefix: Post-therapy Neoadjuvant therapy: Yes Response to neoadjuvant therapy: Complete response Total positive nodes: 0 Total nodes examined: 12 Histologic grading system: 3 grade system   Intent: Curative  Radiation Treatment Dates: 02/13/2022 through 03/31/2022 Site Technique Total Dose (Gy) Dose per Fx (Gy) Completed Fx Beam Energies  Oropharynx: [id number] IMRT 70/70 2 35/35 6X   Narrative: The patient tolerated radiation therapy relatively well.   Plan: The patient will follow-up with radiation oncology in 2-3wks.  -----------------------------------  Eppie Gibson, MD

## 2022-04-12 NOTE — Progress Notes (Signed)
Darrell Cunningham is here for a follow-up appointment with Dr. 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 Last treatment was 03-31-2022    Pain issues, if any: Numbness in his  rt fingers and states that his head is hurting in the back of his head. Using a feeding tube?: no Weight changes, if any: lost 5 pounds ,but has gained his weight back. Swallowing issues, if any: no Smoking or chewing tobacco? no Using fluoride trays daily? no Last ENT visit was on: Has  an appointment in October to see Dr. Constance Holster Other notable issues, if any:  Vitals:   04/17/22 1251  BP: 95/67  Pulse: 98  Resp: 16  Temp: 98.1 F (36.7 C)  TempSrc: Oral  SpO2: 100%  Weight: 49.2 kg  Height: '5\' 1"'$  (1.549 m)

## 2022-04-13 ENCOUNTER — Encounter: Payer: Self-pay | Admitting: Hematology

## 2022-04-13 NOTE — Telephone Encounter (Signed)
No entry 

## 2022-04-17 ENCOUNTER — Encounter: Payer: Self-pay | Admitting: Physical Therapy

## 2022-04-17 ENCOUNTER — Inpatient Hospital Stay: Payer: Medicaid Other | Admitting: Nutrition

## 2022-04-17 ENCOUNTER — Other Ambulatory Visit: Payer: Self-pay

## 2022-04-17 ENCOUNTER — Ambulatory Visit
Admission: RE | Admit: 2022-04-17 | Discharge: 2022-04-17 | Disposition: A | Discharge status: Home / Self Care | Payer: Medicaid Other | Source: Ambulatory Visit | Attending: Radiation Oncology | Admitting: Radiation Oncology

## 2022-04-17 ENCOUNTER — Ambulatory Visit: Payer: Medicaid Other

## 2022-04-17 ENCOUNTER — Ambulatory Visit: Payer: Medicaid Other | Attending: Radiation Oncology | Admitting: Physical Therapy

## 2022-04-17 ENCOUNTER — Encounter: Payer: Self-pay | Admitting: Radiation Oncology

## 2022-04-17 VITALS — BP 95/67 | HR 98 | Temp 98.1°F | Resp 16 | Ht 61.0 in | Wt 108.4 lb

## 2022-04-17 DIAGNOSIS — R1310 Dysphagia, unspecified: Secondary | ICD-10-CM | POA: Diagnosis present

## 2022-04-17 DIAGNOSIS — R293 Abnormal posture: Secondary | ICD-10-CM | POA: Insufficient documentation

## 2022-04-17 DIAGNOSIS — C760 Malignant neoplasm of head, face and neck: Secondary | ICD-10-CM | POA: Insufficient documentation

## 2022-04-17 DIAGNOSIS — Z923 Personal history of irradiation: Secondary | ICD-10-CM | POA: Diagnosis not present

## 2022-04-17 DIAGNOSIS — C01 Malignant neoplasm of base of tongue: Secondary | ICD-10-CM | POA: Diagnosis not present

## 2022-04-17 DIAGNOSIS — Z79899 Other long term (current) drug therapy: Secondary | ICD-10-CM | POA: Diagnosis not present

## 2022-04-17 DIAGNOSIS — Z8501 Personal history of malignant neoplasm of esophagus: Secondary | ICD-10-CM | POA: Diagnosis not present

## 2022-04-17 NOTE — Progress Notes (Signed)
Oncology Nurse Navigator Documentation   I met with Darrell Cunningham and his daughter before and during his appointment with Dr. Isidore Moos today. He is recovering well from his treatment for his head and neck cancer. He denies any concerns at this time. He has been scheduled to see Dr. Isidore Moos on 12/1 to receive results of a post treatment PET scan completed before that appointment. He knows to call me if he has any needs or questions.  [name] RN, BSN, OCN Head & Neck Oncology Nurse [place] at [facility] Phone # [phone]  Fax # [phone]
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
## 2022-04-17 NOTE — Patient Instructions (Signed)
Signs of Aspiration Pneumonia   Chest pain/tightness Fever (can be low grade) Cough  With foul-smelling phlegm (sputum) With sputum containing pus or blood With greenish sputum Fatigue  Shortness of breath  Wheezing   **IF YOU HAVE THESE SIGNS, CONTACT YOUR DOCTOR OR GO TO THE EMERGENCY DEPARTMENT OR URGENT CARE AS SOON AS POSSIBLE**   ====================================== If you start coughing more at meals or with food and liquids, or it's harder to pass food through the throat than is has been, contact your doctor Isidore Moos, [place], or [place]).

## 2022-04-17 NOTE — Progress Notes (Signed)
Nutrition follow up completed with patient and daughter. Patient does not have a feeding tube.  He has completed radiation treatment for esophageal cancer. Wt documented as 108 pounds 6.4 oz. decreased slightly from 109 pounds 11.2 oz. on Sept 11. He weighed 128.5 pounds April 25. He has lost 15% of his body weight in less than 6 months which is severe.   He reports he is swallowing easier. He still has thickened saliva over night and fills an 8 oz cup. He denies N, V, C and D. He thinks he is drinking about 64 oz water. He does not drink Ensure anymore because he says it doesn't agree with him. He will drink milkshakes. He states he can eat anything he wants. He is determined to gain weight.  Nutrition Diagnosis: Inadequate oral intake, continues but improved.  Intervention: Reviewed importance of increasing oral intake consuming high calorie, high protein foods. Drink one milkshake daily for extra calories. Continue baking soda and salt water rinses.  Monitoring, evaluation, goals: Patient will work to increase calories and protein to minimize weight loss and promote weight gain.  Patient will contact RD with questions or concerns.

## 2022-04-18 ENCOUNTER — Encounter: Payer: Self-pay | Admitting: Radiation Oncology

## 2022-04-18 ENCOUNTER — Encounter: Payer: Self-pay | Admitting: Hematology

## 2022-04-18 NOTE — Progress Notes (Signed)
Radiation Oncology         ([phone]) [phone] ________________________________  Name: JAVID KEMLER MRN: [id number]  Date: 04/17/2022  DOB: 1961/05/24  Follow-Up Visit Note  CC: [name], MD  [name], MD  Diagnosis and Prior Radiotherapy:       ICD-10-CM   1. Head and neck cancer ([place])  C76.0     2. Malignant neoplasm of base of tongue (HCC)  C01       Cancer Staging  Malignant neoplasm of base of tongue (HCC) Staging form: Pharynx - HPV-Mediated Oropharynx, AJCC 8th Edition - Clinical stage from 02/20/2022: Stage I (cT1, cN1, cM0, p16+) - Signed by Eppie Gibson, MD on 02/20/2022 Stage prefix: Initial diagnosis  Malignant neoplasm of lower third of esophagus [facility]) Staging form: Esophagus - Adenocarcinoma, AJCC 8th Edition - Clinical stage from 10/26/2016: Stage III (cT3, cN1, cM0) - Signed by [name], MD on 11/05/2016 - Pathologic stage from 04/20/2017: Stage I (ypT0, pN0, cM0, GX) - Signed by [name], MD on 04/23/2017 Stage prefix: Post-therapy Neoadjuvant therapy: Yes Response to neoadjuvant therapy: Complete response Total positive nodes: 0 Total nodes examined: 12 Histologic grading system: 3 grade system   Radiation Treatment Dates: 02/13/2022 through 03/31/2022 Site Technique Total Dose (Gy) Dose per Fx (Gy) Completed Fx Beam Energies  Oropharynx: [id number] IMRT 70/70 2 35/35 6X   Narrative: The patient tolerated radiation therapy relatively well.   CHIEF COMPLAINT:  Here for follow-up and surveillance of throat cancer  Narrative:  The patient returns today for routine follow-up.  Mr. Vanblarcom is here for a follow-up appointment with Dr. 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 Last treatment was 03-31-2022    Pain issues, if any: Numbness in his  rt fingers and states that his head is hurting in the back of his head. Using a feeding tube?: no Weight changes,  if any: lost 5 pounds ,but has gained his weight back. Swallowing issues, if any: no Smoking or chewing tobacco? no Using fluoride trays daily? no Last ENT visit was on: Has  an appointment in October to see Dr. Constance Holster Other notable issues, if any:  Vitals:   04/17/22 1251  BP: 95/67  Pulse: 98  Resp: 16  Temp: 98.1 F (36.7 C)  TempSrc: Oral  SpO2: 100%  Weight: 108 lb 6.4 oz (49.2 kg)  Height: '5\' 1"'$  (1.549 m)                        ALLERGIES:  is allergic to penicillins.  Meds: Current Outpatient Medications  Medication Sig Dispense Refill   acetaminophen (TYLENOL) 500 MG tablet Take 500 mg by mouth every 6 (six) hours as needed for moderate pain.     albuterol (VENTOLIN HFA) 108 (90 Base) MCG/ACT inhaler Inhale 2 puffs into the lungs every 6 (six) hours as needed for wheezing or shortness of breath. 8 g 3   atorvastatin (LIPITOR) 20 MG tablet TAKE 1 TABLET BY MOUTH EVERY DAY 90 tablet 3   benzonatate (TESSALON) 100 MG capsule Take 1 capsule (100 mg total) by mouth 3 (three) times daily as needed for cough. 30 capsule 2   Budeson-Glycopyrrol-Formoterol (BREZTRI AEROSPHERE) 160-9-4.8 MCG/ACT AERO Inhale 2 puffs into the lungs 2 (two) times daily. 10.7 g 11   cyclobenzaprine (FLEXERIL) 5 MG tablet Take 1 tablet (5 mg total) by mouth 3 (three) times daily as needed  for muscle spasms. 30 tablet 0   fluticasone (FLONASE) 50 MCG/ACT nasal spray SPRAY 1 SPRAY INTO EACH NOSTRIL EVERY DAY AS NEEDED FOR ALLERGIES/RHINITIS 16 mL 2   levETIRAcetam (KEPPRA) 100 MG/ML solution Take 5 mLs (500 mg total) by mouth 2 (two) times daily. 473 mL 2   pantoprazole (PROTONIX) 40 MG tablet TAKE 1 TABLET BY MOUTH TWICE A DAY 180 tablet 0   rOPINIRole (REQUIP) 1 MG tablet Take 1 tablet (1 mg total) by mouth at bedtime. 30 tablet 5   senna-docusate (SENOKOT-S) 8.6-50 MG tablet Take 2 tablets by mouth 2 (two) times daily. 120 tablet 0   sulfamethoxazole-trimethoprim (BACTRIM DS) 800-160 MG tablet Take 1  tablet by mouth 2 (two) times daily. 14 tablet 0   fluconazole (DIFLUCAN) 100 MG tablet Take 1 tablet (100 mg total) by mouth daily. (Patient not taking: Reported on 04/17/2022) 30 tablet 0   lidocaine (XYLOCAINE) 2 % solution Patient: Mix 1part 2% viscous lidocaine, 1part H20. Swish & swallow 12m of diluted mixture, 316m before meals and at bedtime, up to QID (Patient not taking: Reported on 04/17/2022) 200 mL 3   magic mouthwash (nystatin, lidocaine, diphenhydrAMINE, alum & mag hydroxide) suspension Take 24m32m4 times a day as needed as directed (Patient not taking: Reported on 04/17/2022) 240 mL 1   No current facility-administered medications for this encounter.   Facility-Administered Medications Ordered in Other Encounters  Medication Dose Route Frequency Provider Last Rate Last Admin   palonosetron (ALOXI) 0.25 MG/5ML injection             Physical Findings: The patient is in no acute distress. Patient is alert and oriented. Wt Readings from Last 3 Encounters:  04/17/22 108 lb 6.4 oz (49.2 kg)  04/04/22 109 lb 11.2 oz (49.8 kg)  03/28/22 115 lb 11.2 oz (52.5 kg)    height is '5\' 1"'$  (1.549 m) and weight is 108 lb 6.4 oz (49.2 kg). His oral temperature is 98.1 F (36.7 C). His blood pressure is 95/67 and his pulse is 98. His respiration is 16 and oxygen saturation is 100%. .  General: Alert and oriented, in no acute distress HEENT: Head is normocephalic. Extraocular movements are intact. Oropharynx is notable for no lesions or thrush Neck: Neck is notable for no obvious masses to palpation Skin: Skin in treatment fields shows satisfactory healing with dryness Lymphatics: see Neck Exam Psychiatric: Judgment and insight are intact. Affect is appropriate.   Lab Findings: Lab Results  Component Value Date   WBC 1.4 (L) 04/04/2022   HGB 12.3 (L) 04/04/2022   HCT 34.8 (L) 04/04/2022   MCV 89.2 04/04/2022   PLT 145 (L) 04/04/2022    Lab Results  Component Value Date   TSH 1.578  01/06/2022    Radiographic Findings: No results found.  Impression/Plan:    1) Head and Neck Cancer Status: Healing from chemoradiation  2) Nutritional Status: Overall he is doing quite well  Wt Readings from Last 3 Encounters:  04/17/22 108 lb 6.4 oz (49.2 kg)  04/04/22 109 lb 11.2 oz (49.8 kg)  03/28/22 115 lb 11.2 oz (52.5 kg)   He has avoided any form of feeding tube.  He is stabilizing his weight now on oral intake  3) Risk Factors: The patient has been educated about risk factors including alcohol and tobacco abuse; they understand that avoidance of alcohol and tobacco is important to prevent recurrences as well as other cancers  4) Swallowing: Functional.  He will continue to  follow with gastroenterology for dilatation as needed in the future.  Note history of esophagectomy for previous esophageal cancer  5)  Thyroid function: Check annually with PCP or medical oncology Lab Results  Component Value Date   TSH 1.578 01/06/2022     6) follow-up in late November or early October with restaging PET scan.  The patient is pleased with this plan  On date of service, in total, I spent 25 minutes on this encounter. Patient was seen in person.  Minutes pertaining to date of service only.  This was signed after date of service _____________________________________   Eppie Gibson, MD

## 2022-04-26 ENCOUNTER — Other Ambulatory Visit: Payer: Self-pay

## 2022-04-26 ENCOUNTER — Inpatient Hospital Stay: Payer: Medicaid Other

## 2022-04-26 ENCOUNTER — Encounter: Payer: Self-pay | Admitting: Hematology and Oncology

## 2022-04-26 ENCOUNTER — Inpatient Hospital Stay: Payer: Medicaid Other | Admitting: Hematology and Oncology

## 2022-04-26 VITALS — BP 109/80 | HR 120 | Temp 98.1°F | Resp 18 | Ht 61.0 in | Wt 113.9 lb

## 2022-04-26 DIAGNOSIS — C155 Malignant neoplasm of lower third of esophagus: Secondary | ICD-10-CM

## 2022-04-26 DIAGNOSIS — C01 Malignant neoplasm of base of tongue: Secondary | ICD-10-CM | POA: Diagnosis not present

## 2022-04-26 LAB — CBC WITH DIFFERENTIAL/PLATELET
Abs Immature Granulocytes: 0.01 10*3/uL (ref 0.00–0.07)
Basophils Absolute: 0 10*3/uL (ref 0.0–0.1)
Basophils Relative: 1 %
Eosinophils Absolute: 0 10*3/uL (ref 0.0–0.5)
Eosinophils Relative: 1 %
HCT: 29.9 % — ABNORMAL LOW (ref 39.0–52.0)
Hemoglobin: 10.5 g/dL — ABNORMAL LOW (ref 13.0–17.0)
Immature Granulocytes: 0 %
Lymphocytes Relative: 8 %
Lymphs Abs: 0.4 10*3/uL — ABNORMAL LOW (ref 0.7–4.0)
MCH: 32.5 pg (ref 26.0–34.0)
MCHC: 35.1 g/dL (ref 30.0–36.0)
MCV: 92.6 fL (ref 80.0–100.0)
Monocytes Absolute: 0.7 10*3/uL (ref 0.1–1.0)
Monocytes Relative: 15 %
Neutro Abs: 3.8 10*3/uL (ref 1.7–7.7)
Neutrophils Relative %: 75 %
Platelets: 256 10*3/uL (ref 150–400)
RBC: 3.23 MIL/uL — ABNORMAL LOW (ref 4.22–5.81)
RDW: 16.1 % — ABNORMAL HIGH (ref 11.5–15.5)
WBC: 5 10*3/uL (ref 4.0–10.5)
nRBC: 0 % (ref 0.0–0.2)

## 2022-04-26 LAB — COMPREHENSIVE METABOLIC PANEL
ALT: 19 U/L (ref 0–44)
AST: 18 U/L (ref 15–41)
Albumin: 3.2 g/dL — ABNORMAL LOW (ref 3.5–5.0)
Alkaline Phosphatase: 74 U/L (ref 38–126)
Anion gap: 6 (ref 5–15)
BUN: 14 mg/dL (ref 8–23)
CO2: 26 mmol/L (ref 22–32)
Calcium: 9 mg/dL (ref 8.9–10.3)
Chloride: 109 mmol/L (ref 98–111)
Creatinine, Ser: 0.62 mg/dL (ref 0.61–1.24)
GFR, Estimated: >60 mL/min (ref >60)
Glucose, Bld: 90 mg/dL (ref 70–99)
Potassium: 3.7 mmol/L (ref 3.5–5.1)
Sodium: 141 mmol/L (ref 135–145)
Total Bilirubin: 0.4 mg/dL (ref 0.3–1.2)
Total Protein: 6.4 g/dL — ABNORMAL LOW (ref 6.5–8.1)

## 2022-04-26 NOTE — Assessment & Plan Note (Signed)
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  He is here for follow-up after completing chemoradiation.  He is doing quite well, fatigue has gotten better, appetite has gotten better.  He is quite pleased with improvement in symptoms so far.  We have discussed about repeating PET/CT in December to assess response.  If complete response, he can at that point follow-up with [place] once a year.  We will repeat labs today.  I have encouraged him to reach out to [place] if there is worsening numbness or tingling in his right hand.  He expressed understanding.  This is unlikely related to cisplatin.
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
## 2022-05-01 ENCOUNTER — Ambulatory Visit: Payer: Medicaid Other

## 2022-05-01 ENCOUNTER — Encounter: Payer: Self-pay | Admitting: *Deleted

## 2022-05-01 ENCOUNTER — Telehealth: Payer: Self-pay

## 2022-05-01 NOTE — Telephone Encounter (Signed)
Pt called in wanting to check his vaccinations. Pt was unsure of what he is due for. Pt would like a cb please.  Cb#: [phone]

## 2022-05-11 ENCOUNTER — Ambulatory Visit: Payer: Medicaid Other | Admitting: Internal Medicine

## 2022-05-11 ENCOUNTER — Encounter: Payer: Self-pay | Admitting: Internal Medicine

## 2022-05-11 VITALS — BP 112/86 | HR 79 | Ht 61.0 in | Wt 111.0 lb

## 2022-05-11 DIAGNOSIS — Z9889 Other specified postprocedural states: Secondary | ICD-10-CM | POA: Diagnosis not present

## 2022-05-11 DIAGNOSIS — K219 Gastro-esophageal reflux disease without esophagitis: Secondary | ICD-10-CM

## 2022-05-11 DIAGNOSIS — C01 Malignant neoplasm of base of tongue: Secondary | ICD-10-CM | POA: Diagnosis not present

## 2022-05-11 DIAGNOSIS — Z8501 Personal history of malignant neoplasm of esophagus: Secondary | ICD-10-CM | POA: Diagnosis not present

## 2022-05-11 DIAGNOSIS — Z9049 Acquired absence of other specified parts of digestive tract: Secondary | ICD-10-CM

## 2022-05-11 NOTE — Patient Instructions (Signed)
If you are age 63 or younger, your body mass index should be between 19-25. Your Body mass index is 20.97 kg/m. If this is out of the aformentioned range listed, please consider follow up with your Primary Care Provider.  ________________________________________________________  The [facility] providers would like to encourage you to use [facility] to communicate with providers for non-urgent requests or questions.  Due to long hold times on the telephone, sending your provider a message by [facility] may be a faster and more efficient way to get a response.  Please allow 48 business hours for a response.  Please remember that this is for non-urgent requests.  _______________________________________________________  CONTINUE: pantoprazole  You will need a follow up appointment in January 2024.  We will contact you to get this appointment scheduled.   Thank you for entrusting me with your care and choosing [facility].  Dr [name]

## 2022-05-11 NOTE — Progress Notes (Signed)
Subjective:    Patient ID: Darrell Cunningham, male    DOB: May 21, 1961, 62 y.o.   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  He is here today with his daughter and his mother.  He was last here in November 2022 for upper endoscopy.  He had dilation with balloon of the esophagogastric anastomosis to 20 mm.  A single small angiectasia was found in the gastric body and the exam was otherwise unremarkable.  He has completed planned therapy for base of tongue cancer and will be having a surveillance PET scan in December.  This treatment process was more difficult for him than his esophageal treatment but he attributes a lot of this to anxiety associated with the diagnosis.  He is feeling better now.  He is eating well.  He can eat hamburgers and "normal" food though he avoids pork and chicken due to rare dysphagia type symptoms.  No heartburn though he continues on his pantoprazole therapy 40 mg twice daily.  Bowel movements have been mostly regular though he cannot get constipated.  If this happens he uses MiraLAX.  He is having numbness in the second and third finger on his right hand.  This is scheduled to be evaluated by neurology.   Review of Systems As per HPI, otherwise negative  Current Medications, Allergies, Past Medical History, Past Surgical History, Family History and Social History were reviewed in [employer] record.    Objective:   Physical Exam BP 112/86   Pulse 79   Ht '5\' 1"'$  (1.549 m)   Wt 111 lb (50.3 kg)   BMI 20.97 kg/m  Gen: awake, alert, NAD HEENT: anicteric, op  clear CV: RRR, no mrg Pulm: CTA b/l Abd: soft, NT/ND, +BS throughout, well-healed abdominal scar Ext: no c/c/e Neuro: nonfocal   NUCLEAR MEDICINE PET SKULL BASE TO THIGH   TECHNIQUE: 7.0 mCi F-18 FDG was injected intravenously. Full-ring PET imaging was performed from the skull base to thigh after the radiotracer. CT data was obtained and used for attenuation correction and anatomic localization.   Fasting blood glucose: 90 mg/dl   COMPARISON:  CT on 12/09/2021   FINDINGS: Mediastinal blood-pool activity (background): SUV max = 2.2   Liver activity (reference): SUV max = N/A   NECK: Hypermetabolic mass or lymphadenopathy is seen in the left upper neck in levels 2A and 2B, which measures 4.2 x 2.2 cm on image 74/3. This is hypermetabolic, with SUV max of 8.3. No other hypermetabolic masses or lymphadenopathy seen within the neck.   Incidental CT findings:  None.   CHEST: No hypermetabolic lymph nodes. No suspicious pulmonary nodules seen on CT images.   Incidental CT findings: Prior esophagectomy with gastric pull-through noted. Aortic and coronary atherosclerotic calcification incidentally noted.   ABDOMEN/PELVIS: No abnormal hypermetabolic activity within the liver, pancreas, adrenal glands, or spleen. No hypermetabolic lymph nodes in the abdomen or pelvis.   Incidental CT findings: Aortic atherosclerotic calcification incidentally noted.   SKELETON: No focal hypermetabolic bone lesions to suggest skeletal metastasis.   Incidental CT findings:  None.   IMPRESSION: Hypermetabolic mass or lymphadenopathy in the left upper neck, levels 2A and 2B.   No other sites of malignancy identified.  Aortic Atherosclerosis (ICD10-I70.0).     Electronically Signed   By: Marlaine Hind M.D.   On: 12/29/2021 11:12       Latest Ref Rng & Units 04/26/2022    2:31 PM 04/04/2022    8:44 AM 03/28/2022    8:54 AM  CBC  WBC 4.0 - 10.5 K/uL 5.0  1.4  2.9   Hemoglobin 13.0 -  17.0 g/dL 10.5  12.3  12.3   Hematocrit 39.0 - 52.0 % 29.9  34.8  34.9   Platelets 150 - 400 K/uL 256  145  125    CMP     Component Value Date/Time   NA 141 04/26/2022 1431   NA 137 06/14/2017 0840   K 3.7 04/26/2022 1431   K 3.9 06/14/2017 0840   CL 109 04/26/2022 1431   CO2 26 04/26/2022 1431   CO2 22 06/14/2017 0840   GLUCOSE 90 04/26/2022 1431   GLUCOSE 101 06/14/2017 0840   BUN 14 04/26/2022 1431   BUN 10.2 06/14/2017 0840   CREATININE 0.62 04/26/2022 1431   CREATININE 0.65 04/04/2022 0844   CREATININE 0.7 06/14/2017 0840   CALCIUM 9.0 04/26/2022 1431   CALCIUM 9.5 06/14/2017 0840   PROT 6.4 (L) 04/26/2022 1431   PROT 7.4 06/14/2017 0840   ALBUMIN 3.2 (L) 04/26/2022 1431   ALBUMIN 3.2 (L) 06/14/2017 0840   AST 18 04/26/2022 1431   AST 18 03/13/2022 1035   AST 22 06/14/2017 0840   ALT 19 04/26/2022 1431   ALT 26 03/13/2022 1035   ALT 26 06/14/2017 0840   ALKPHOS 74 04/26/2022 1431   ALKPHOS 132 06/14/2017 0840   BILITOT 0.4 04/26/2022 1431   BILITOT 0.6 03/13/2022 1035   BILITOT 0.38 06/14/2017 0840   GFRNONAA >60 04/26/2022 1431   GFRNONAA >60 04/04/2022 0844   GFRNONAA >89 09/21/2016 1137   GFRAA >60 03/09/2020 0818   GFRAA >60 12/11/2018 0941   GFRAA >89 09/21/2016 1137        Assessment & Plan:   61 year old male 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  GERD/history of esophageal cancer/esophageal dysphagia --he is done very well from esophageal cancer standpoint and he is now past 5 years.  Very low likelihood that this will recur.  PET scan was reassuring in regard to this prior malignancy.  We will consider EGD in the future based  on symptoms and likely for repeat dilation.  I recommended we wait till at least after his next PET scan to ensure that his tongue cancer remains in remission -- Continue pantoprazole 40 mg twice daily -- Soft diet -- Follow-up with me in January or February 2024, will discuss repeat upper endoscopy at that time based on symptoms and hopeful remission of tongue cancer  2.  Base of tongue cancer --he has completed chemo and radiation therapy.  Follow-up PET scan scheduled for December.  3.  History of colon polyps --small adenoma removed in August 2021, repeat colonoscopy recommended August 2028  30 minutes total spent today including patient facing time, coordination of care, reviewing medical history/procedures/pertinent radiology studies, and documentation of the encounter.

## 2022-05-16 ENCOUNTER — Other Ambulatory Visit: Payer: Self-pay | Admitting: Family Medicine

## 2022-05-16 DIAGNOSIS — K219 Gastro-esophageal reflux disease without esophagitis: Secondary | ICD-10-CM

## 2022-05-16 NOTE — Telephone Encounter (Signed)
Requested Prescriptions  Pending Prescriptions Disp Refills  . pantoprazole (PROTONIX) 40 MG tablet [Pharmacy Med Name: PANTOPRAZOLE SOD DR 40 MG TAB] 180 tablet 0    Sig: TAKE 1 TABLET BY MOUTH TWICE A DAY     Gastroenterology: Proton Pump Inhibitors Passed - 05/16/2022  5:34 PM      Passed - Valid encounter within last 12 months    Recent Outpatient Visits          10 months ago New onset seizure ([place])   [place] Pickard, [name], MD   1 year ago Prostate cancer screening   [place] [name], MD   1 year ago COPD exacerbation [facility])   [facility] Eulogio Bear, NP   2 years ago Malignant neoplasm of lower third of esophagus ([place])   [place] [name], MD   4 years ago Hypotension due to drugs   [place] Pickard, [name], MD

## 2022-05-18 ENCOUNTER — Other Ambulatory Visit: Payer: Self-pay | Admitting: Family Medicine

## 2022-05-19 ENCOUNTER — Other Ambulatory Visit: Payer: Self-pay | Admitting: Family Medicine

## 2022-05-19 ENCOUNTER — Telehealth: Payer: Self-pay | Admitting: Family Medicine

## 2022-05-19 DIAGNOSIS — C01 Malignant neoplasm of base of tongue: Secondary | ICD-10-CM

## 2022-05-19 MED ORDER — LEVETIRACETAM 100 MG/ML PO SOLN: 500.0000 mg | Freq: Two times a day (BID) | ORAL | 2 refills | Status: DC

## 2022-05-19 NOTE — Telephone Encounter (Signed)
Patient called to request refill of  levETIRAcetam (pills)   Pharmacy:   [id number] [place] [address] [address] [id number] Phone:  [phone] Fax:  [phone] DEA #:  [id number] LOV: 01/11/2021  Patient has enough for 10 days; making sure he doesn't have a lapse in doses.   Please advise at [phone]

## 2022-05-19 NOTE — Telephone Encounter (Signed)
Entered in Error

## 2022-05-19 NOTE — Telephone Encounter (Signed)
Refused Keppra 500 mg tablets because it was discontinued 03/06/2022.   Put on the solution.

## 2022-05-20 ENCOUNTER — Other Ambulatory Visit: Payer: Self-pay | Admitting: Family Medicine

## 2022-05-22 ENCOUNTER — Telehealth: Payer: Self-pay

## 2022-05-22 ENCOUNTER — Other Ambulatory Visit: Payer: Self-pay | Admitting: Family Medicine

## 2022-05-22 MED ORDER — LEVETIRACETAM 500 MG PO TABS: 500.0000 mg | ORAL_TABLET | Freq: Two times a day (BID) | ORAL | 11 refills | Status: AC

## 2022-05-22 NOTE — Telephone Encounter (Signed)
MEDICATION MANAGEMENT: Pt called and states the Keppra that was sent in for the patient was sent in as a liquid. Pt asks if the pill form can be sent in instead? Thank you.

## 2022-06-05 ENCOUNTER — Inpatient Hospital Stay: Payer: Medicaid Other | Attending: Physician Assistant

## 2022-06-05 ENCOUNTER — Other Ambulatory Visit: Payer: Self-pay

## 2022-06-05 VITALS — BP 107/69 | HR 79 | Temp 98.3°F | Resp 18

## 2022-06-05 DIAGNOSIS — C155 Malignant neoplasm of lower third of esophagus: Secondary | ICD-10-CM | POA: Insufficient documentation

## 2022-06-05 DIAGNOSIS — Z5111 Encounter for antineoplastic chemotherapy: Secondary | ICD-10-CM | POA: Diagnosis present

## 2022-06-05 DIAGNOSIS — Z95828 Presence of other vascular implants and grafts: Secondary | ICD-10-CM

## 2022-06-05 DIAGNOSIS — C01 Malignant neoplasm of base of tongue: Secondary | ICD-10-CM | POA: Diagnosis present

## 2022-06-05 LAB — CBC WITH DIFFERENTIAL/PLATELET
Abs Immature Granulocytes: 0.01 10*3/uL (ref 0.00–0.07)
Basophils Absolute: 0 10*3/uL (ref 0.0–0.1)
Basophils Relative: 1 %
Eosinophils Absolute: 0.3 10*3/uL (ref 0.0–0.5)
Eosinophils Relative: 6 %
HCT: 34.3 % — ABNORMAL LOW (ref 39.0–52.0)
Hemoglobin: 12 g/dL — ABNORMAL LOW (ref 13.0–17.0)
Immature Granulocytes: 0 %
Lymphocytes Relative: 8 %
Lymphs Abs: 0.4 10*3/uL — ABNORMAL LOW (ref 0.7–4.0)
MCH: 33.8 pg (ref 26.0–34.0)
MCHC: 35 g/dL (ref 30.0–36.0)
MCV: 96.6 fL (ref 80.0–100.0)
Monocytes Absolute: 0.7 10*3/uL (ref 0.1–1.0)
Monocytes Relative: 12 %
Neutro Abs: 4.3 10*3/uL (ref 1.7–7.7)
Neutrophils Relative %: 73 %
Platelets: 195 10*3/uL (ref 150–400)
RBC: 3.55 MIL/uL — ABNORMAL LOW (ref 4.22–5.81)
RDW: 14.3 % (ref 11.5–15.5)
WBC: 5.8 10*3/uL (ref 4.0–10.5)
nRBC: 0 % (ref 0.0–0.2)

## 2022-06-05 LAB — COMPREHENSIVE METABOLIC PANEL
ALT: 10 U/L (ref 0–44)
AST: 13 U/L — ABNORMAL LOW (ref 15–41)
Albumin: 3.9 g/dL (ref 3.5–5.0)
Alkaline Phosphatase: 78 U/L (ref 38–126)
Anion gap: 5 (ref 5–15)
BUN: 17 mg/dL (ref 8–23)
CO2: 28 mmol/L (ref 22–32)
Calcium: 9.1 mg/dL (ref 8.9–10.3)
Chloride: 105 mmol/L (ref 98–111)
Creatinine, Ser: 0.65 mg/dL (ref 0.61–1.24)
GFR, Estimated: >60 mL/min (ref >60)
Glucose, Bld: 92 mg/dL (ref 70–99)
Potassium: 4.3 mmol/L (ref 3.5–5.1)
Sodium: 138 mmol/L (ref 135–145)
Total Bilirubin: 0.5 mg/dL (ref 0.3–1.2)
Total Protein: 6.6 g/dL (ref 6.5–8.1)

## 2022-06-05 MED ORDER — SODIUM CHLORIDE 0.9% FLUSH
10.0000 mL | Freq: Once | INTRAVENOUS | Status: AC
  Administered 2022-06-05: 10 mL

## 2022-06-05 MED ORDER — HEPARIN SOD (PORK) LOCK FLUSH 100 UNIT/ML IV SOLN
500.0000 [IU] | Freq: Once | INTRAVENOUS | Status: AC
  Administered 2022-06-05: 500 [IU]

## 2022-06-05 MED ORDER — ALTEPLASE 2 MG IJ SOLR: 2.0000 mg | Freq: Once | INTRAMUSCULAR | Status: DC

## 2022-06-14 ENCOUNTER — Ambulatory Visit: Payer: Medicaid Other | Attending: Radiation Oncology

## 2022-06-14 ENCOUNTER — Other Ambulatory Visit: Payer: Self-pay

## 2022-06-14 DIAGNOSIS — R1310 Dysphagia, unspecified: Secondary | ICD-10-CM | POA: Insufficient documentation

## 2022-06-14 DIAGNOSIS — C01 Malignant neoplasm of base of tongue: Secondary | ICD-10-CM
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
## 2022-06-20 ENCOUNTER — Other Ambulatory Visit: Payer: Self-pay | Admitting: Family Medicine

## 2022-06-20 DIAGNOSIS — K219 Gastro-esophageal reflux disease without esophagitis: Secondary | ICD-10-CM

## 2022-06-20 NOTE — Telephone Encounter (Signed)
Unable to refill per protocol, Rx request is too soon. Last refill 05/16/22 for 90. Will refuse.  Requested Prescriptions  Pending Prescriptions Disp Refills   pantoprazole (PROTONIX) 40 MG tablet [Pharmacy Med Name: PANTOPRAZOLE SOD DR 40 MG TAB] 180 tablet 0    Sig: TAKE 1 TABLET BY MOUTH TWICE A DAY     Gastroenterology: Proton Pump Inhibitors Passed - 06/20/2022 11:47 AM      Passed - Valid encounter within last 12 months    Recent Outpatient Visits           11 months ago New onset seizure ([place])   [place] Pickard, [name], MD   1 year ago Prostate cancer screening   [place] [name], MD   1 year ago COPD exacerbation [facility])   [facility] Eulogio Bear, NP   2 years ago Malignant neoplasm of lower third of esophagus ([place])   [place] [name], MD   4 years ago Hypotension due to drugs   [place] Pickard, [name], MD

## 2022-06-21 NOTE — Progress Notes (Signed)
Darrell Cunningham is here for a follow-up appointment with Dr. 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 Last treatment was 03-31-2022   Pain issues, if any: Denies any mouth or throat pain Using a feeding tube?: N/A Weight changes, if any:  Wt Readings from Last 3 Encounters:  06/30/22 111 lb 6.4 oz (50.5 kg)  05/11/22 111 lb (50.3 kg)  04/26/22 113 lb 14.4 oz (51.7 kg)   Swallowing issues, if any: Overall denies any issues or concerns. Reports food bolus will occasionally get stuck, but he is able to get down with liquids or coughing to clear throat Smoking or chewing tobacco? None Using fluoride trays daily? N/A--has partial dentures (uppers and lowers). Denies any new mouth sores Last ENT visit was on: 04/25/2022 Saw Dr. [name] "--Impression & Plans:  Stable post treatment. He is starting to gain weight back. He is eating and drinking well. Posttreatment PET scan scheduled for December. Recheck 3 months or sooner as needed." Other notable issues, if any: Reports a new rash under his left axilla that has been present for more than a week. He reports it occasionally itches or is uncomfortable, as well as occasionally feels swollen. Continuing to work with PT to help manage neck lymphedema.

## 2022-06-26 ENCOUNTER — Ambulatory Visit (HOSPITAL_COMMUNITY)
Admission: RE | Admit: 2022-06-26 | Discharge: 2022-06-26 | Disposition: A | Discharge status: Home / Self Care | Payer: Medicaid Other | Source: Ambulatory Visit | Attending: Radiation Oncology | Admitting: Radiation Oncology

## 2022-06-26 DIAGNOSIS — C01 Malignant neoplasm of base of tongue: Secondary | ICD-10-CM | POA: Insufficient documentation

## 2022-06-26 LAB — GLUCOSE, CAPILLARY: Glucose-Capillary: 85 mg/dL (ref 70–99)

## 2022-06-26 MED ORDER — FLUDEOXYGLUCOSE F - 18 (FDG) INJECTION
5.5700 | Freq: Once | INTRAVENOUS | Status: AC | PRN
  Administered 2022-06-26: 5.57 via INTRAVENOUS

## 2022-06-27 ENCOUNTER — Telehealth: Payer: Self-pay | Admitting: Hematology and Oncology
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
## 2022-06-27 NOTE — Telephone Encounter (Signed)
Called patient to r/s dec. 22 appt to dec. 21. Patient notified of new appt. time

## 2022-06-28 ENCOUNTER — Telehealth: Payer: Self-pay

## 2022-06-28 ENCOUNTER — Encounter: Payer: Self-pay | Admitting: Physical Therapy

## 2022-06-28 ENCOUNTER — Ambulatory Visit: Payer: Medicaid Other | Attending: Radiation Oncology | Admitting: Physical Therapy

## 2022-06-28 ENCOUNTER — Other Ambulatory Visit: Payer: Self-pay

## 2022-06-28 DIAGNOSIS — C01 Malignant neoplasm of base of tongue: Secondary | ICD-10-CM | POA: Insufficient documentation

## 2022-06-28 DIAGNOSIS — I890 Lymphedema, not elsewhere classified: Secondary | ICD-10-CM | POA: Diagnosis present

## 2022-06-28 DIAGNOSIS — M542 Cervicalgia: Secondary | ICD-10-CM | POA: Insufficient documentation

## 2022-06-28 DIAGNOSIS — C760 Malignant neoplasm of head, face and neck: Secondary | ICD-10-CM | POA: Insufficient documentation

## 2022-06-28 NOTE — Telephone Encounter (Signed)
  Prescription Request  06/28/2022  Is this a "Controlled Substance" medicine? Yes  LOV: 06/20/2022   What is the name of the medication or equipment? cyclobenzaprine (FLEXERIL) 5 MG tablet [[id number]]   Have you contacted your pharmacy to request a refill? No   Which pharmacy would you like this sent to?   [id number] [place] - [address]  Patient notified that their request is being sent to the clinical staff for review and that they should receive a response within 2 business days.   Please advise at [phone]

## 2022-06-29 ENCOUNTER — Other Ambulatory Visit: Payer: Self-pay | Admitting: Family Medicine

## 2022-06-29 MED ORDER — CYCLOBENZAPRINE HCL 5 MG PO TABS: 5.0000 mg | ORAL_TABLET | Freq: Three times a day (TID) | ORAL | 2 refills | Status: DC | PRN

## 2022-06-30 ENCOUNTER — Ambulatory Visit
Admission: RE | Admit: 2022-06-30 | Discharge: 2022-06-30 | Disposition: A | Discharge status: Home / Self Care | Payer: Medicaid Other | Source: Ambulatory Visit | Attending: Radiation Oncology | Admitting: Radiation Oncology

## 2022-06-30 ENCOUNTER — Encounter: Payer: Self-pay | Admitting: Radiation Oncology

## 2022-06-30 ENCOUNTER — Other Ambulatory Visit: Payer: Self-pay

## 2022-06-30 VITALS — BP 113/83 | HR 106 | Temp 97.7°F | Resp 20 | Ht 60.0 in | Wt 111.4 lb

## 2022-06-30 DIAGNOSIS — C770 Secondary and unspecified malignant neoplasm of lymph nodes of head, face and neck: Secondary | ICD-10-CM

## 2022-06-30 DIAGNOSIS — Z923 Personal history of irradiation: Secondary | ICD-10-CM | POA: Insufficient documentation

## 2022-06-30 DIAGNOSIS — C01 Malignant neoplasm of base of tongue: Secondary | ICD-10-CM

## 2022-06-30 DIAGNOSIS — Z79899 Other long term (current) drug therapy: Secondary | ICD-10-CM | POA: Diagnosis not present

## 2022-06-30 DIAGNOSIS — R21 Rash and other nonspecific skin eruption: Secondary | ICD-10-CM | POA: Diagnosis not present

## 2022-06-30 DIAGNOSIS — Z8501 Personal history of malignant neoplasm of esophagus: Secondary | ICD-10-CM | POA: Insufficient documentation

## 2022-06-30 DIAGNOSIS — Z85810 Personal history of malignant neoplasm of tongue: Secondary | ICD-10-CM | POA: Diagnosis not present

## 2022-06-30 NOTE — Progress Notes (Signed)
Radiation Oncology         [phone] ________________________________  Name: Darrell Cunningham MRN: [id number]  Date: 06/30/2022  DOB: [birth year]  Follow-Up Visit Note  CC: [name], MD  [name], MD  Diagnosis and Prior Radiotherapy:       ICD-10-CM   1. Malignant neoplasm of base of tongue ([place])  C01       Cancer Staging  Malignant neoplasm of base of tongue ([place]) Staging form: Pharynx - HPV-Mediated Oropharynx, AJCC 8th Edition - Clinical stage from 02/20/2022: Stage I (cT1, cN1, cM0, p16+) - Signed by Eppie Gibson, MD on 02/20/2022 Stage prefix: Initial diagnosis  Malignant neoplasm of lower third of esophagus [facility]) Staging form: Esophagus - Adenocarcinoma, AJCC 8th Edition - Clinical stage from 10/26/2016: Stage III (cT3, cN1, cM0) - Signed by [name], MD on 11/05/2016 - Pathologic stage from 04/20/2017: Stage I (ypT0, pN0, cM0, GX) - Signed by [name], MD on 04/23/2017 Stage prefix: Post-therapy Neoadjuvant therapy: Yes Response to neoadjuvant therapy: Complete response Total positive nodes: 0 Total nodes examined: 12 Histologic grading system: 3 grade system   Radiation Treatment Dates: 02/13/2022 through 03/31/2022 Site Technique Total Dose (Gy) Dose per Fx (Gy) Completed Fx Beam Energies  Oropharynx: [id number] IMRT 70/70 2 35/35 6X   Narrative: The patient tolerated radiation therapy relatively well.   CHIEF COMPLAINT:  Here for follow-up and surveillance of throat cancer  Narrative:  Mr. Seiden is here for a follow-up appointment with Dr. 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 Last treatment was 03-31-2022   Pain issues, if any: Denies any mouth or throat pain Using a feeding tube?: N/A Weight changes, if any:  Wt Readings from Last 3 Encounters:  06/30/22 111 lb 6.4 oz (50.5 kg)  05/11/22 111 lb (50.3 kg)  04/26/22 113 lb 14.4 oz (51.7 kg)    Swallowing issues, if any: Overall denies any issues or concerns. Reports food bolus will occasionally get stuck, but he is able to get down with liquids or coughing to clear throat Smoking or chewing tobacco? None Using fluoride trays daily? N/A--has partial dentures (uppers and lowers). Denies any new mouth sores Last ENT visit was on: 04/25/2022 Saw Dr. [name] "--Impression & Plans:  Stable post treatment. He is starting to gain weight back. He is eating and drinking well. Posttreatment PET scan scheduled for December. Recheck 3 months or sooner as needed." Other notable issues, if any: Reports a new rash under his left axilla that has been present for more than a week. He reports it occasionally itches or is uncomfortable, as well as occasionally feels swollen. Continuing to work with PT to help manage neck lymphedema.                       ALLERGIES:  is allergic to penicillins.  Meds: Current Outpatient Medications  Medication Sig Dispense Refill   acetaminophen (TYLENOL) 500 MG tablet Take 500 mg by mouth every 6 (six) hours as needed for moderate pain.     albuterol (VENTOLIN HFA) 108 (90 Base) MCG/ACT inhaler Inhale 2 puffs into the lungs every 6 (six) hours as needed for wheezing or shortness of breath. 8 g 3   atorvastatin (LIPITOR) 20 MG tablet TAKE 1 TABLET BY MOUTH EVERY DAY 90 tablet 3   benzonatate (TESSALON) 100 MG capsule Take 1 capsule (100 mg total) by mouth 3 (three)  times daily as needed for cough. 30 capsule 2   Budeson-Glycopyrrol-Formoterol (BREZTRI AEROSPHERE) 160-9-4.8 MCG/ACT AERO Inhale 2 puffs into the lungs 2 (two) times daily. 10.7 g 11   cyclobenzaprine (FLEXERIL) 5 MG tablet Take 1 tablet (5 mg total) by mouth 3 (three) times daily as needed for muscle spasms. 30 tablet 2   fluconazole (DIFLUCAN) 100 MG tablet Take 1 tablet (100 mg total) by mouth daily. (Patient not taking: Reported on 04/17/2022) 30 tablet 0   fluticasone (FLONASE) 50 MCG/ACT nasal  spray SPRAY 1 SPRAY INTO EACH NOSTRIL EVERY DAY AS NEEDED FOR ALLERGIES/RHINITIS 16 mL 2   levETIRAcetam (KEPPRA) 500 MG tablet Take 1 tablet (500 mg total) by mouth 2 (two) times daily. 60 tablet 11   lidocaine (XYLOCAINE) 2 % solution Patient: Mix 1part 2% viscous lidocaine, 1part H20. Swish & swallow 64m of diluted mixture, 311m before meals and at bedtime, up to QID (Patient not taking: Reported on 04/17/2022) 200 mL 3   magic mouthwash (nystatin, lidocaine, diphenhydrAMINE, alum & mag hydroxide) suspension Take 71m39m4 times a day as needed as directed (Patient not taking: Reported on 04/17/2022) 240 mL 1   pantoprazole (PROTONIX) 40 MG tablet TAKE 1 TABLET BY MOUTH TWICE A DAY 180 tablet 0   rOPINIRole (REQUIP) 1 MG tablet Take 1 tablet (1 mg total) by mouth at bedtime. 30 tablet 5   senna-docusate (SENOKOT-S) 8.6-50 MG tablet Take 2 tablets by mouth 2 (two) times daily. 120 tablet 0   sulfamethoxazole-trimethoprim (BACTRIM DS) 800-160 MG tablet Take 1 tablet by mouth 2 (two) times daily. 14 tablet 0   No current facility-administered medications for this encounter.   Facility-Administered Medications Ordered in Other Encounters  Medication Dose Route Frequency Provider Last Rate Last Admin   palonosetron (ALOXI) 0.25 MG/5ML injection             Physical Findings: The patient is in no acute distress. Patient is alert and oriented. Wt Readings from Last 3 Encounters:  06/30/22 111 lb 6.4 oz (50.5 kg)  05/11/22 111 lb (50.3 kg)  04/26/22 113 lb 14.4 oz (51.7 kg)    height is 5' (1.524 m) and weight is 111 lb 6.4 oz (50.5 kg). His temperature is 97.7 F (36.5 C). His blood pressure is 113/83 and his pulse is 106 (abnormal). His respiration is 20 and oxygen saturation is 100%. .  General: Alert and oriented, in no acute distress HEENT: Head is normocephalic. Extraocular movements are intact. Oropharynx is notable for no lesions or thrush Neck: Neck is notable for no masses to  palpation Skin: Skin in treatment fields shows satisfactory healing.  He has a patchy erythematous rash in the left axilla.  There are some cutaneous erythematous nodules that are slightly raised with this rash Lymphatics: see Neck Exam.  No palpable axillary adenopathy on the left Psychiatric: Judgment and insight are intact. Affect is appropriate.   Lab Findings: Lab Results  Component Value Date   WBC 5.8 06/05/2022   HGB 12.0 (L) 06/05/2022   HCT 34.3 (L) 06/05/2022   MCV 96.6 06/05/2022   PLT 195 06/05/2022    Lab Results  Component Value Date   TSH 1.578 01/06/2022    Radiographic Findings: NM PET Image Restag (PS) Skull Base To Thigh  Result Date: 06/28/2022 CLINICAL DATA:  Subsequent treatment strategy for head and neck cancer (base of the tongue). Prior history of esophageal cancer also. EXAM: NUCLEAR MEDICINE PET SKULL BASE TO THIGH TECHNIQUE: 5.56 mCi F-18  FDG was injected intravenously. Full-ring PET imaging was performed from the skull base to thigh after the radiotracer. CT data was obtained and used for attenuation correction and anatomic localization. Fasting blood glucose: 85 mg/dl COMPARISON:  PET-CT 12/29/2021 FINDINGS: Mediastinal blood pool activity: SUV max 2.21 Liver activity: SUV max NA NECK: No residual measurable left neck adenopathy. Some residual matted soft tissue density in faint calcification consistent with treated disease. SUV max is 2.75. No base of the tongue lesion is demonstrated. No new adenopathy. Findings suggest a good response to treatment. Incidental CT findings: Left IJ Port-A-Cath noted. The left submandibular gland was enlarged on the previous study but is now. CHEST: No hypermetabolic mediastinal or hilar nodes. No suspicious pulmonary nodules on the CT scan. Incidental CT findings: Stable surgical changes from esophagectomy and gastric pull-through procedure. No findings suspicious for recurrent tumor. Mild hypermetabolism at the GE junction is  a normal finding. ABDOMEN/PELVIS: No abnormal hypermetabolic activity within the liver, pancreas, adrenal glands, or spleen. No hypermetabolic lymph nodes in the abdomen or pelvis. Incidental CT findings: Age advanced atherosclerotic calcifications involving the abdominal aorta and iliac arteries but no aneurysm. SKELETON: No findings suspicious for osseous metastatic disease. Incidental CT findings: None. IMPRESSION: 1. PET-CT findings suggest a good response to treatment. No residual measurable left neck adenopathy. Minimal residual treated disease with faint calcifications and very low level FDG uptake. 2. No findings for metastatic disease. 3. Stable surgical changes from esophagectomy and gastric pull-through. No findings for recurrent tumor. 4. Stable age advanced vascular disease. Electronically Signed   By: Marijo Sanes M.D.   On: 06/28/2022 07:32    Impression/Plan:    1) Head and Neck Cancer Status: I personally reviewed his PET scan.  He is in remission.  This is great news!  I will see him back in 1 year with restaging CT scan of neck and chest at that time.  He will continue to follow intermittently with otolaryngology and medical oncology in the interim.  2) Nutritional Status: Overall he is doing quite well  Wt Readings from Last 3 Encounters:  06/30/22 111 lb 6.4 oz (50.5 kg)  05/11/22 111 lb (50.3 kg)  04/26/22 113 lb 14.4 oz (51.7 kg)   He has avoided any form of feeding tube.    3) Risk Factors: The patient has been educated about risk factors including alcohol and tobacco abuse; they understand that avoidance of alcohol and tobacco is important to prevent recurrences as well as other cancers  4) Swallowing: Functional.  He will continue to follow with gastroenterology for dilatation as needed in the future.  Note history of esophagectomy for previous esophageal cancer  5)  Thyroid function: Check annually with PCP or medical oncology Lab Results  Component Value Date   TSH  1.578 01/06/2022   6) Patient will contact his PCP for management of his rash; I do not think this is consistent with cancer recurrence.  He has no palpable adenopathy nor any visible adenopathy in that region on his PET scan.    On date of service, in total, I spent 30 minutes on this encounter. Patient was seen in person.     Eppie Gibson, MD

## 2022-07-05 ENCOUNTER — Encounter: Payer: Self-pay | Admitting: Physical Therapy

## 2022-07-05 ENCOUNTER — Ambulatory Visit: Payer: Medicaid Other | Attending: Radiation Oncology | Admitting: Physical Therapy

## 2022-07-05 DIAGNOSIS — C760 Malignant neoplasm of head, face and neck: Secondary | ICD-10-CM | POA: Diagnosis present

## 2022-07-05 DIAGNOSIS — R293 Abnormal posture: Secondary | ICD-10-CM | POA: Insufficient documentation

## 2022-07-05 DIAGNOSIS — M542 Cervicalgia: Secondary | ICD-10-CM | POA: Insufficient documentation

## 2022-07-05 DIAGNOSIS — I890 Lymphedema, not elsewhere classified: Secondary | ICD-10-CM | POA: Insufficient documentation
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
## 2022-07-10 ENCOUNTER — Encounter: Payer: Self-pay | Admitting: Physical Therapy

## 2022-07-10 ENCOUNTER — Ambulatory Visit: Payer: Medicaid Other | Admitting: Physical Therapy

## 2022-07-10 DIAGNOSIS — I890 Lymphedema, not elsewhere classified: Secondary | ICD-10-CM

## 2022-07-10 DIAGNOSIS — M542 Cervicalgia: Secondary | ICD-10-CM
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
## 2022-07-12 ENCOUNTER — Encounter: Payer: Self-pay | Admitting: Physical Therapy

## 2022-07-12 ENCOUNTER — Ambulatory Visit: Payer: Medicaid Other | Admitting: Physical Therapy

## 2022-07-12 DIAGNOSIS — C760 Malignant neoplasm of head, face and neck: Secondary | ICD-10-CM

## 2022-07-12 DIAGNOSIS — I890 Lymphedema, not elsewhere classified: Secondary | ICD-10-CM | POA: Diagnosis not present

## 2022-07-12 DIAGNOSIS — M542 Cervicalgia: Secondary | ICD-10-CM
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
## 2022-07-17 ENCOUNTER — Ambulatory Visit: Payer: Medicaid Other | Admitting: Physical Therapy

## 2022-07-17 ENCOUNTER — Encounter: Payer: Self-pay | Admitting: Physical Therapy

## 2022-07-17 DIAGNOSIS — C760 Malignant neoplasm of head, face and neck: Secondary | ICD-10-CM

## 2022-07-17 DIAGNOSIS — I890 Lymphedema, not elsewhere classified: Secondary | ICD-10-CM

## 2022-07-17 DIAGNOSIS — M542 Cervicalgia: Secondary | ICD-10-CM

## 2022-07-17 DIAGNOSIS — R293 Abnormal posture: Secondary | ICD-10-CM
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
## 2022-07-20 ENCOUNTER — Inpatient Hospital Stay: Payer: Medicaid Other | Attending: Physician Assistant

## 2022-07-20 ENCOUNTER — Other Ambulatory Visit: Payer: Self-pay

## 2022-07-20 ENCOUNTER — Inpatient Hospital Stay: Payer: Medicaid Other | Admitting: Hematology and Oncology

## 2022-07-20 DIAGNOSIS — Z825 Family history of asthma and other chronic lower respiratory diseases: Secondary | ICD-10-CM | POA: Insufficient documentation

## 2022-07-20 DIAGNOSIS — Z79899 Other long term (current) drug therapy: Secondary | ICD-10-CM | POA: Diagnosis not present

## 2022-07-20 DIAGNOSIS — K1231 Oral mucositis (ulcerative) due to antineoplastic therapy: Secondary | ICD-10-CM | POA: Insufficient documentation

## 2022-07-20 DIAGNOSIS — Z9221 Personal history of antineoplastic chemotherapy: Secondary | ICD-10-CM | POA: Diagnosis not present

## 2022-07-20 DIAGNOSIS — Z9049 Acquired absence of other specified parts of digestive tract: Secondary | ICD-10-CM | POA: Diagnosis not present

## 2022-07-20 DIAGNOSIS — Z8041 Family history of malignant neoplasm of ovary: Secondary | ICD-10-CM | POA: Insufficient documentation

## 2022-07-20 DIAGNOSIS — Z880 Allergy status to penicillin: Secondary | ICD-10-CM | POA: Diagnosis not present

## 2022-07-20 DIAGNOSIS — C01 Malignant neoplasm of base of tongue: Secondary | ICD-10-CM

## 2022-07-20 DIAGNOSIS — I700 Atherosclerosis of aorta: Secondary | ICD-10-CM | POA: Insufficient documentation

## 2022-07-20 DIAGNOSIS — T451X5A Adverse effect of antineoplastic and immunosuppressive drugs, initial encounter: Secondary | ICD-10-CM | POA: Diagnosis not present

## 2022-07-20 DIAGNOSIS — I890 Lymphedema, not elsewhere classified: Secondary | ICD-10-CM | POA: Diagnosis not present

## 2022-07-20 DIAGNOSIS — Z803 Family history of malignant neoplasm of breast: Secondary | ICD-10-CM | POA: Diagnosis not present

## 2022-07-20 DIAGNOSIS — I2510 Atherosclerotic heart disease of native coronary artery without angina pectoris: Secondary | ICD-10-CM | POA: Diagnosis not present

## 2022-07-20 DIAGNOSIS — K117 Disturbances of salivary secretion: Secondary | ICD-10-CM | POA: Diagnosis not present

## 2022-07-20 DIAGNOSIS — Z923 Personal history of irradiation: Secondary | ICD-10-CM | POA: Insufficient documentation

## 2022-07-20 DIAGNOSIS — R682 Dry mouth, unspecified: Secondary | ICD-10-CM | POA: Insufficient documentation

## 2022-07-20 DIAGNOSIS — C155 Malignant neoplasm of lower third of esophagus: Secondary | ICD-10-CM | POA: Diagnosis present

## 2022-07-20 DIAGNOSIS — Z5111 Encounter for antineoplastic chemotherapy: Secondary | ICD-10-CM | POA: Diagnosis present

## 2022-07-20 DIAGNOSIS — Z95828 Presence of other vascular implants and grafts: Secondary | ICD-10-CM

## 2022-07-20 DIAGNOSIS — J4489 Other specified chronic obstructive pulmonary disease: Secondary | ICD-10-CM | POA: Diagnosis not present

## 2022-07-20 MED ORDER — SODIUM CHLORIDE 0.9% FLUSH
10.0000 mL | Freq: Once | INTRAVENOUS | Status: AC
  Administered 2022-07-20: 10 mL

## 2022-07-20 MED ORDER — HEPARIN SOD (PORK) LOCK FLUSH 100 UNIT/ML IV SOLN
500.0000 [IU] | Freq: Once | INTRAVENOUS | Status: AC
  Administered 2022-07-20: 500 [IU]
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
## 2022-07-20 NOTE — Assessment & Plan Note (Signed)
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  He completed concurrent CRT, EOT PET CT with complete response. No concerns on exam today. He will continue with ENT surveillance. He will RTC with [place] in one yr.  Benay Pike MD

## 2022-07-21 ENCOUNTER — Inpatient Hospital Stay: Payer: Medicaid Other | Admitting: Hematology and Oncology

## 2022-07-21 ENCOUNTER — Ambulatory Visit: Payer: Medicaid Other

## 2022-07-21 ENCOUNTER — Inpatient Hospital Stay: Payer: Medicaid Other

## 2022-07-21 DIAGNOSIS — I890 Lymphedema, not elsewhere classified: Secondary | ICD-10-CM | POA: Diagnosis not present

## 2022-07-21 DIAGNOSIS — M542 Cervicalgia: Secondary | ICD-10-CM

## 2022-07-21 DIAGNOSIS — R293 Abnormal posture: Secondary | ICD-10-CM

## 2022-07-21 DIAGNOSIS — C760 Malignant neoplasm of head, face and neck: Secondary | ICD-10-CM
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
## 2022-07-26 ENCOUNTER — Encounter: Payer: Self-pay | Admitting: Physical Therapy

## 2022-07-26 ENCOUNTER — Ambulatory Visit: Payer: Medicaid Other | Admitting: Physical Therapy

## 2022-07-26 DIAGNOSIS — I890 Lymphedema, not elsewhere classified: Secondary | ICD-10-CM | POA: Diagnosis not present

## 2022-07-26 DIAGNOSIS — C760 Malignant neoplasm of head, face and neck: Secondary | ICD-10-CM

## 2022-07-26 DIAGNOSIS — R293 Abnormal posture: Secondary | ICD-10-CM

## 2022-07-26 DIAGNOSIS — M542 Cervicalgia: Secondary | ICD-10-CM
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
## 2022-07-28 ENCOUNTER — Encounter: Payer: Self-pay | Admitting: Physical Therapy

## 2022-07-28 ENCOUNTER — Ambulatory Visit: Payer: Medicaid Other | Admitting: Physical Therapy

## 2022-07-28 DIAGNOSIS — I890 Lymphedema, not elsewhere classified: Secondary | ICD-10-CM | POA: Diagnosis not present

## 2022-07-28 DIAGNOSIS — R293 Abnormal posture: Secondary | ICD-10-CM

## 2022-07-28 DIAGNOSIS — M542 Cervicalgia: Secondary | ICD-10-CM

## 2022-07-28 DIAGNOSIS — C760 Malignant neoplasm of head, face and neck: Secondary | ICD-10-CM
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
## 2022-08-03 ENCOUNTER — Ambulatory Visit: Payer: Medicaid Other | Attending: Radiation Oncology

## 2022-08-03 DIAGNOSIS — M542 Cervicalgia: Secondary | ICD-10-CM | POA: Insufficient documentation

## 2022-08-03 DIAGNOSIS — R1310 Dysphagia, unspecified: Secondary | ICD-10-CM | POA: Diagnosis present

## 2022-08-03 DIAGNOSIS — C760 Malignant neoplasm of head, face and neck: Secondary | ICD-10-CM | POA: Diagnosis present

## 2022-08-03 DIAGNOSIS — I890 Lymphedema, not elsewhere classified: Secondary | ICD-10-CM | POA: Diagnosis present

## 2022-08-03 DIAGNOSIS — R293 Abnormal posture: Secondary | ICD-10-CM | POA: Insufficient documentation
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
## 2022-08-08 ENCOUNTER — Other Ambulatory Visit (HOSPITAL_COMMUNITY): Payer: Medicaid Other

## 2022-08-09 ENCOUNTER — Ambulatory Visit (HOSPITAL_COMMUNITY)
Admission: RE | Admit: 2022-08-09 | Discharge: 2022-08-09 | Disposition: A | Discharge status: Home / Self Care | Payer: Medicaid Other | Source: Ambulatory Visit | Attending: Hematology and Oncology | Admitting: Hematology and Oncology

## 2022-08-09 ENCOUNTER — Encounter: Payer: Self-pay | Admitting: Physical Therapy

## 2022-08-09 ENCOUNTER — Ambulatory Visit: Payer: Medicaid Other | Admitting: Physical Therapy

## 2022-08-09 DIAGNOSIS — I890 Lymphedema, not elsewhere classified: Secondary | ICD-10-CM

## 2022-08-09 DIAGNOSIS — C760 Malignant neoplasm of head, face and neck: Secondary | ICD-10-CM

## 2022-08-09 DIAGNOSIS — C01 Malignant neoplasm of base of tongue: Secondary | ICD-10-CM

## 2022-08-09 DIAGNOSIS — Z452 Encounter for adjustment and management of vascular access device: Secondary | ICD-10-CM | POA: Insufficient documentation

## 2022-08-09 DIAGNOSIS — M542 Cervicalgia: Secondary | ICD-10-CM

## 2022-08-09 DIAGNOSIS — R293 Abnormal posture: Secondary | ICD-10-CM

## 2022-08-09 HISTORY — PX: IR REMOVAL TUN ACCESS W/ PORT W/O FL MOD SED: IMG2290

## 2022-08-09 MED ORDER — LIDOCAINE-EPINEPHRINE 1 %-1:100000 IJ SOLN
INTRAMUSCULAR | Status: AC
  Administered 2022-08-09: 10 mL
  Filled 2022-08-09: qty 1

## 2022-08-09 MED ORDER — LIDOCAINE-EPINEPHRINE 1 %-1:100000 IJ SOLN: 20.0000 mL | Freq: Once | INTRAMUSCULAR | Status: DC

## 2022-08-09 NOTE — Procedures (Signed)
Vascular and Interventional Radiology Procedure Note  Patient: Darrell Cunningham DOB: Dec 24, 1960 Medical Record Number: [id number] Note Date/Time: 08/09/22 8:56 AM   Performing Physician: Michaelle Birks, MD Assistant(s): None  Diagnosis:  Rx completion  Procedure: PORT REMOVAL  Anesthesia: Local Anesthetic Complications: None Estimated Blood Loss: Minimal Specimens:  None  Findings:  Successful removal of a left-sided venous port. Primary incision closure. Dermabond at skin.  See detailed procedure note with images in PACS. The patient tolerated the procedure well without incident or complication and was returned to Recovery in stable condition.    Michaelle Birks, MD [facility]   Pager. [place]

## 2022-08-09 NOTE — Therapy (Signed)
OUTPATIENT PHYSICAL THERAPY ONCOLOGY TREATMENT Patient Name: Darrell Cunningham MRN: [id number] DOB:October 23, 1960, 62 y.o., male Today's Date: 08/09/2022  END OF SESSION:  PT End of Session - 08/09/22 1007     Visit Number 10    Number of Visits 16    Date for PT Re-Evaluation 08/25/22    PT Start Time 1006    PT Stop Time 1053    PT Time Calculation (min) 47 min    Activity Tolerance Patient tolerated treatment well    Behavior During Therapy [facility] for tasks assessed/performed               Past Medical History:  Diagnosis Date   Allergy    Arthritis    Asthma    as a child   Cancer ([place]) 10/09/2016   ESOPHAGUS CARCINOMA    COPD (chronic obstructive pulmonary disease) (HCC)    CP (cerebral palsy), spastic ([place])    right   Dysphagia    Dyspnea    with exersion   Emphysema of lung ([place])    Encounter for nonprocreative genetic counseling 10/31/2016   Mr. Jaquith underwent genetic counseling for hereditary cancer syndromes on 10/31/2016. Though he is a candidate for genetic testing, he declines at this time.   GERD (gastroesophageal reflux disease)    History of chemotherapy    03-2017   History of radiation therapy    03-2017   Hypertension    Malignant neoplasm of base of tongue (HCC)    Neuromuscular disorder (HCC)    [name]   Pneumonia 4 yrs ago   Past Surgical History:  Procedure Laterality Date   COLONOSCOPY     COMPLETE ESOPHAGECTOMY N/A 04/16/2017   Procedure: ESOPHAGECTOMY COMPLETE,Transhiatal total esophagectomy;  Surgeon: [name], MD;  Location: [facility] OR;  Service: Thoracic;  Laterality: N/A;   DIRECT LARYNGOSCOPY N/A 01/27/2022   Procedure: DIRECT LARYNGOSCOPY WITH BIOPSIES; FROZEN SECTION;  Surgeon: [name], MD;  Location: [place];  Service: ENT;  Laterality: N/A;   ESOPHAGOGASTRODUODENOSCOPY (EGD) WITH PROPOFOL N/A 03/30/2019   Procedure: ESOPHAGOGASTRODUODENOSCOPY (EGD) WITH PROPOFOL;  Surgeon: Ladene Artist, MD;  Location:  [facility] ENDOSCOPY;  Service: Endoscopy;  Laterality: N/A;   EUS N/A 10/26/2016   Procedure: UPPER ENDOSCOPIC ULTRASOUND (EUS) LINEAR;  Surgeon: [name], MD;  Location: [facility] ENDOSCOPY;  Service: Endoscopy;  Laterality: N/A;   EYE SURGERY Bilateral age 22    for cross eyes   FOREIGN BODY REMOVAL  03/30/2019   Procedure: FOREIGN BODY REMOVAL;  Surgeon: Ladene Artist, MD;  Location: [place];  Service: Endoscopy;;   IR FLUORO GUIDE PORT INSERTION RIGHT  11/08/2016   right upper chest   IR IMAGING GUIDED PORT INSERTION  02/24/2022   IR REMOVAL TUN ACCESS W/ PORT W/O FL MOD SED  04/12/2020   IR US GUIDE VASC ACCESS RIGHT  11/08/2016   JEJUNOSTOMY N/A 04/16/2017   Procedure: Donney Rankins;  Surgeon: [name], MD;  Location: [place];  Service: Thoracic;  Laterality: N/A;   JEJUNOSTOMY     removal of feeding tube /pt unsure of date   MOUTH SURGERY     POLYPECTOMY     TONSILLECTOMY Bilateral 01/27/2022   Procedure: POSSIBLE TONSILLECTOMY;  Surgeon: [name], MD;  Location: [place];  Service: ENT;  Laterality: Bilateral;   UPPER GASTROINTESTINAL ENDOSCOPY     UPPER GASTROINTESTINAL ENDOSCOPY  06/03/2021   hx of esoph ca  VIDEO BRONCHOSCOPY N/A 04/16/2017   Procedure: VIDEO BRONCHOSCOPY, Transhiatal Total Esophagectomy, Esophagogastrostomy, pyloromotomy, Feeding Jejunostomy;  Surgeon: [name], MD;  Location: [place] OR;  Service: Thoracic;  Laterality: N/A;   Patient Active Problem List   Diagnosis Date Noted   Mucositis due to antineoplastic therapy 03/13/2022   Weight loss, unintentional 03/13/2022   Malignant neoplasm of base of tongue ([place]) 02/20/2022   Counseling regarding goals of care 12/22/2021   Food impaction of esophagus 03/30/2019   Esophageal obstruction due to food impaction    Anemia of chronic disease 01/03/2017   SIRS (systemic inflammatory response syndrome) ([place]) 01/01/2017   Port catheter in place 12/18/2016   Hypersensitivity reaction  11/01/2016   Extravasation accident 11/01/2016   Encounter for nonprocreative genetic counseling 10/31/2016   Malignant neoplasm of lower third of esophagus ([place]) 10/15/2016   COPD (chronic obstructive pulmonary disease) with emphysema ([place]) 07/14/2015   Prediabetes    Asthma    Cerebral palsy with gross motor function classification system level I ([place]) 26-Jan-1961    PCP: Jenna Luo, MD  REFERRING PROVIDER: Eppie Gibson, MD  REFERRING DIAG: C01 (ICD-10-CM) - Malignant neoplasm of base of tongue ([place])  THERAPY DIAG:  Cervicalgia  Lymphedema, not elsewhere classified  Abnormal posture  Malignant tumor of head and neck ([place])  ONSET DATE: 05/24/22  Rationale for Evaluation and Treatment: Rehabilitation  SUBJECTIVE:                                                                                                                                                                                           SUBJECTIVE STATEMENT: I got my port taken out today.   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 He presented on 11/22/21 for evaluation of new left cervical lymphadenopathy X 1 month. 12/09/21 CT neck revealed a large, heterogeneous left level 2A lymph node, presumably consistent with metastatic disease. No other enlarged or abnormal cervical lymph nodes were appreciated. Other findings of potential clinical significance noted on CT included a hyperenhancing appearance of the salivary glands, which likely reflects a sequela of radiation treatment. 12/19/21 Biopsy of abnormal left cervical lymph node revealed keratinizing moderately differentiated SCC, p 16 +. 12/29/21 PET demonstrated the the left level 2A cervical mass as hypermetabolic with an SUV max of 8.3, and measuring 4.2 x 2.2 cm. No other hypermetabolic masses or lymphadenopathy were seen within the neck, chest, abdomen, or pelvis, or findings of osseous metastatic disease. 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  He will receive 35 fractions of radiation to his  Left tongue base and bilateral neck. He started on 02/13/22 and will complete on 03/31/22. He has a history of mild cerebral palsy. 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 He was treated with concurrent chemoradiation and esophagectomy. He achieved a complete pathological response and continued on surveillance. He has required multiple EGD for dilation. Pt also has COPD.    PAIN:  Are you having pain? 5/10 in chest where port was removed earlier this morning  PRECAUTIONS: Other: COPD, mild CP  WEIGHT BEARING RESTRICTIONS: No  FALLS:  Has patient fallen in last 6 months? No  LIVING ENVIRONMENT: Lives with: lives with their daughter Lives in: Mobile home Has following equipment at home: None  OCCUPATION: unemployed, disabled  LEISURE: pt reports he is active outside  [place]: left   PRIOR LEVEL OF FUNCTION: Independent  PATIENT GOALS: to get the swelling down   OBJECTIVE:  COGNITION: Overall cognitive status: Within functional limits for tasks assessed   PALPATION: Thickened edema at anterior neck  OBSERVATIONS / OTHER ASSESSMENTS: fullness at anterior neck  POSTURE: Forward head and rounded shoulders  SHOULDER AROM:   Impaired  R shoulder is more limited due to cerebral palsy   CERVICAL AROM:     Percent limited at eval 04/17/22 06/28/22  Flexion WFL WFL WFL  Extension [facility] WFL WFL  Right lateral flexion WFL [facility] WFL  Left lateral flexion WFL with pain WFL WFL  Right rotation [facility] WFL 25% limited  Left rotation 25% limited with pain WFL 25% limited                          (Blank rows=not tested)         LYMPHEDEMA ASSESSMENT:      Circumference in cm 06/28/22 07/17/22 07/28/22 08/09/22  4 cm superior to sternal notch around neck 37 39.5 38.5 39.2 39  6 cm superior to sternal notch around neck 36 40.4 39.3 38.8 39.1  8 cm superior to  sternal notch around neck 36.5 41.3 40.4 39.9 41  10 cm superior to sternal notch around neck   42.2 40.9 41.5   L lateral nostril from base of nose to medial tragus        R corner of mouth to where ear lobe meets face        L corner of mouth to where ear lobe meets face                          (Blank rows=not tested)  LYMPHEDEMA ASSESSMENTS:   RADIATION: completed 03/31/22 to base of tongue and bilateral neck  LYMPHEDEMA LIFE IMPACT SCALE:      TODAY'S TREATMENT:  DATE:  08/09/22: Therapeutic Exercises Sitting in chair near wall with head against purple ball on wall and performed cervical retraction into that, pt was able to perform 10x, 5 sec holds but had to stop due to increased discomfort in area where pt had port removed this morning Manual Therapy STM: In supine with HOB elevated and 2 pillows: To bilateral neck, with focus on lateral and posterior cervical muscles and bilateral SCM while performing PROM; then at end of session seated in chair with cocoa butter to posterior neck muscles MLD: using the [employer] anterior approach handout as follows: short neck, lateral cervical chain, bil shoulder collectors, 5 diaphragmatic breaths, bilateral axillary nodes, bilateral pectoral nodes, bil shoulder collectors, anterior chest, short neck, posterior neck moving fluid towards pathway aimed at lateral neck, then lateral and anterior neck moving fluid towards pathway aimed at lateral neck then retracing all steps.  STM to bilateral posterior and lateral neck and suboccipital area to help decrease tightness and pain 08/03/22: Therapeutic Exercises Tried Meeks Decompression Exercises: Shoulder press 5x, 5 sec holds, unable to perform head press without coughing due to throat feeling compressed with chin tuck/cervical retraction Then sitting in chair near wall  with head against purple ball on wall and performed cervical retraction into that, pt was able to perform 20x, 5 sec holds without triggering coughing; then holding cervical retraction with bil UE er x10; next bil cerv rot with head press on ball 3x each side Manual Therapy STM: In supine with HOB elevated and 2 pillows: To bilateral neck, with focus on lateral and posterior cervical muscles and bilateral SCM while performing PROM; then at end of session seated in chair with cocoa butter to posterior neck muscles P/ROM: into bilateral rotation and side bend being mindful of port on L side MLD: using the [place] anterior approach handout as follows: short neck, lateral cervical chain, bil shoulder collectors, 5 diaphragmatic breaths, bilateral axillary nodes, bilateral pectoral nodes, bil shoulder collectors, anterior chest, short neck, posterior neck moving fluid towards pathway aimed at lateral neck, then lateral and anterior neck moving fluid towards pathway aimed at lateral neck then retracing all steps.   07/28/22: Manual Therapy STM: Started in R sidelying but pt was uncomfortable so tried supine but pt still uncomfortable and having increased neck pain so moved pt to sitting: To bilateral neck with cocoa butter, with focus on lateral and posterior cervical muscles and bilateral SCM while having pt move through AROM  07/26/22: Manual Therapy STM: In sitting: To bilateral neck with focus on lateral and posterior cervical muscles and bilateral SCM MLD: using the [place] anterior approach handout as follows: short neck, 5 diaphragmatic breaths, bilateral axillary nodes, bilateral pectoral nodes, bil shoulder collectors, anterior chest, short neck, posterior neck moving fluid towards pathway aimed at lateral neck, then lateral and anterior neck moving fluid towards pathway aimed at lateral neck then retracing all steps.     PATIENT EDUCATION:  Education details: anatomy and physiology of the  lymphatic system, need for compression, chip pack, basic MLD  Person educated: Patient and Child(ren) Education method: Customer service manager Education comprehension: verbalized understanding and returned demonstration  HOME EXERCISE PROGRAM: Wear chip pack as much as possible Do self MLD daily   ASSESSMENT:  CLINICAL IMPRESSION: Remeasured pt's neck circumferences. His neck circumferences have increased since last session. Pt reports he has been massaging his anterior neck but has not been doing the entire sequence. Pt was educated on importance of doing the entire sequence for  proper lymphatic drainage. He is still having pain with neck ROM especially rotation. Was unable to focus on exercise today secondary to pain from port removal this morning. Pt would benefit form continued skilled PT services to decrease neck pain and lymphedema.   OBJECTIVE IMPAIRMENTS: decreased knowledge of condition, decreased knowledge of use of DME, decreased ROM, increased edema, increased fascial restrictions, postural dysfunction, and pain.   ACTIVITY LIMITATIONS:  sleeping  PARTICIPATION LIMITATIONS:  none  PERSONAL FACTORS:  none  are also affecting patient's functional outcome.   REHAB POTENTIAL: Good  CLINICAL DECISION MAKING: Stable/uncomplicated  EVALUATION COMPLEXITY: Low  GOALS: Goals reviewed with patient? Yes  SHORT TERM GOALS=SHORT TERM GOALS Target date: 07/26/22  Pt will demonstrate a 3 cm decrease at 8 cm proximal to sternal notch to decrease risk of infection.  Baseline: Goal status: IN PROGRESS 07/28/22- 1.4 cm decrease; 08/09/22- 0.3 cm decrease  2.  Pt will obtain compression garment for long term management of lymphedema.  Baseline:  Goal status: MET 07/17/22  3.  Pt will report he is able to look over either shoulder without increased pain in lateral neck to allow improved comfort and function.  Baseline:  Goal status: IN PROGRESS 07/28/22- pt still has increased  pain when looking over shoulder; 08/09/22- pt still having pain rated 3 or 4/10 when looking over shoulders  4.  Pt and/or daughter will be independent in self MLD for long term management of edema.  Baseline:  Goal status: MET 07/28/22- both are independent    PLAN:  PT FREQUENCY: 2x/week  PT DURATION: 4 weeks  PLANNED INTERVENTIONS: Therapeutic exercises, Therapeutic activity, Patient/Family education, Self Care, Joint mobilization, Orthotic/Fit training, Manual lymph drainage, Compression bandaging, Vasopneumatic device, Manual therapy, and Re-evaluation  PLAN FOR NEXT SESSION: Cont working to progress gentle postural strengthening exercises as tolerated; Cont STM to bilateral lateral neck, MLD to anterior neck   Erminia Mcnew Breedlove Blue, PT 08/09/2022, 11:00 AM

## 2022-08-14 ENCOUNTER — Ambulatory Visit: Payer: Medicaid Other

## 2022-08-15 ENCOUNTER — Ambulatory Visit: Payer: Medicaid Other | Admitting: Rehabilitation

## 2022-08-15 ENCOUNTER — Ambulatory Visit: Payer: Medicaid Other | Attending: Radiation Oncology

## 2022-08-15 ENCOUNTER — Encounter: Payer: Self-pay | Admitting: Rehabilitation

## 2022-08-15 DIAGNOSIS — R293 Abnormal posture: Secondary | ICD-10-CM

## 2022-08-15 DIAGNOSIS — R1310 Dysphagia, unspecified: Secondary | ICD-10-CM | POA: Diagnosis not present

## 2022-08-15 DIAGNOSIS — I890 Lymphedema, not elsewhere classified: Secondary | ICD-10-CM

## 2022-08-15 DIAGNOSIS — M542 Cervicalgia: Secondary | ICD-10-CM | POA: Diagnosis not present

## 2022-08-15 DIAGNOSIS — C760 Malignant neoplasm of head, face and neck: Secondary | ICD-10-CM

## 2022-08-15 NOTE — Patient Instructions (Signed)
    When eating something that is a little drier than normal:  TAKE SMALLER BITES AND CHEW WELL SWALLOW DRY SWALLOW AFTER YOU SWALLOW THE FOOD DOWN TAKE A  *L I T T L E* BIT OF WATER TO CLEAR IT DOWN IF YOU STILL FEEL SOME THERE  If you still have difficulty after you get your esophagus stretched, Dr. [name], Dr. Isidore Moos, or Dr. Constance Holster. You may need to go for a swallow test called a MODIFIED BARIUM SWALLOW test.

## 2022-08-15 NOTE — Therapy (Signed)
[facility] [address], [place], [place], [address] Phone: [phone]   Fax:  [phone]  Speech Language Pathology Treatment  Patient Details  Name: Darrell Cunningham MRN: [id number] Date of Birth: 14-Oct-1960 No data recorded  Encounter Date: 08/15/2022   End of Session - 08/15/22 1016     Visit Number 5    Number of Visits 6    Date for SLP Re-Evaluation 09/12/22    Authorization Type medicaid healthy blue    SLP Start Time 1019    SLP Stop Time  1103    SLP Time Calculation (min) 44 min    Activity Tolerance Patient tolerated treatment well               Past Medical History:  Diagnosis Date   Allergy    Arthritis    Asthma    as a child   Cancer ([place]) 10/09/2016   ESOPHAGUS CARCINOMA    COPD (chronic obstructive pulmonary disease) ([place])    CP (cerebral palsy), spastic ([place])    right   Dysphagia    Dyspnea    with exersion   Emphysema of lung ([place])    Encounter for nonprocreative genetic counseling 10/31/2016   Mr. Chatwin underwent genetic counseling for hereditary cancer syndromes on 10/31/2016. Though he is a candidate for genetic testing, he declines at this time.   GERD (gastroesophageal reflux disease)    History of chemotherapy    03-2017   History of radiation therapy    03-2017   Hypertension    Malignant neoplasm of base of tongue (HCC)    Neuromuscular disorder (HCC)    [name]   Pneumonia 4 yrs ago    Past Surgical History:  Procedure Laterality Date   COLONOSCOPY     COMPLETE ESOPHAGECTOMY N/A 04/16/2017   Procedure: ESOPHAGECTOMY COMPLETE,Transhiatal total esophagectomy;  Surgeon: [name], MD;  Location: [facility] OR;  Service: Thoracic;  Laterality: N/A;   DIRECT LARYNGOSCOPY N/A 01/27/2022   Procedure: DIRECT LARYNGOSCOPY WITH BIOPSIES; FROZEN SECTION;  Surgeon: [name], MD;  Location: [place];  Service: ENT;  Laterality: N/A;   ESOPHAGOGASTRODUODENOSCOPY (EGD) WITH PROPOFOL  N/A 03/30/2019   Procedure: ESOPHAGOGASTRODUODENOSCOPY (EGD) WITH PROPOFOL;  Surgeon: Ladene Artist, MD;  Location: [facility] ENDOSCOPY;  Service: Endoscopy;  Laterality: N/A;   EUS N/A 10/26/2016   Procedure: UPPER ENDOSCOPIC ULTRASOUND (EUS) LINEAR;  Surgeon: [name], MD;  Location: [facility] ENDOSCOPY;  Service: Endoscopy;  Laterality: N/A;   EYE SURGERY Bilateral age 74    for cross eyes   FOREIGN BODY REMOVAL  03/30/2019   Procedure: FOREIGN BODY REMOVAL;  Surgeon: Ladene Artist, MD;  Location: [place];  Service: Endoscopy;;   IR FLUORO GUIDE PORT INSERTION RIGHT  11/08/2016   right upper chest   IR IMAGING GUIDED PORT INSERTION  02/24/2022   IR REMOVAL TUN ACCESS W/ PORT W/O FL MOD SED  04/12/2020   IR REMOVAL TUN ACCESS W/ PORT W/O FL MOD SED  08/09/2022   IR US GUIDE VASC ACCESS RIGHT  11/08/2016   JEJUNOSTOMY N/A 04/16/2017   Procedure: Donney Rankins;  Surgeon: [name], MD;  Location: [place];  Service: Thoracic;  Laterality: N/A;   JEJUNOSTOMY     removal of feeding tube /pt unsure of date   MOUTH SURGERY     POLYPECTOMY     TONSILLECTOMY Bilateral 01/27/2022   Procedure: POSSIBLE TONSILLECTOMY;  Surgeon: [name], MD;  Location: [facility]  OR;  Service: ENT;  Laterality: Bilateral;   UPPER GASTROINTESTINAL ENDOSCOPY     UPPER GASTROINTESTINAL ENDOSCOPY  06/03/2021   hx of esoph ca   VIDEO BRONCHOSCOPY N/A 04/16/2017   Procedure: VIDEO BRONCHOSCOPY, Transhiatal Total Esophagectomy, Esophagogastrostomy, pyloromotomy, Feeding Jejunostomy;  Surgeon: [name], MD;  Location: [place];  Service: Thoracic;  Laterality: N/A;    There were no vitals filed for this visit.  Subjecitve: "I have to go see "Dr. [name] and get that done. I feel like it's time." (Pt, re: esophageal stretching)  Pt states bil pain in parathyroid area today, 3/10 "It always stays sore," pt stated.   Objective: 08/15/22: Pt had absent seizure last Friday. No reported incr'd difficulty  with eating since Friday. Pt attempted steak and this was more difficult in that he had to "regurgitate it back up and swallow it again after I chewed it up." Has had meatloaf and potatoes, cereal all successfully. Pt now cont to use liquid wash for clearance. With POs (fig bar and water) pt coughed with 2/5 bites of fig bar. Pt took large bites. SLP cued pt for smaller bites, chew well, double swallow, and liquid wash and he did not cough on next 5 bites. SLP provided education about this to pt after visit. SLP had to provide mod cues usually for procedure for HEP. SLP printed off HEP and wrote simplified language which pt then performed HEP with modified independence. SLP and pt agreed it would be best to see pt again in 2 months. SLP STRONGLY encouraged pt perform at least 20 reps of each swallowing exercise at LEAST 5 days/week and explained rationale to pt.  06/14/22: Pt has eaten steak (small bites), grilled cheese, chinese buffet, PB and J sandwich in past 48 hours. Suboptimal completion of HEP (pt is doing effortful swallow when he eats). Because of this SLP reiterated at least 20 reps of all exercises except [place] (x3 BID) until mid-March (approx 4 months from today). HEP procedure was WNL. Told pt s/sx of decr'd swallow ability and to contact Dr. Isidore Moos, Constance Holster, or Pyrtle if this should occur. With POs, pt did not demo any overt s/sx of oral or pharyngeal deficits. He denies coughing during POs, but can cough randomly on thickened saliva. SLP encouraged him to drink at least 64 oz.H2O daily to thin saliva/secretions/phlegm.             CLINICAL IMPRESSION: Patient is a 62 y.o. male who was seen today for swallowing as the have completed radiation therapy on 03-31-22. Today pt ate cereal bar and drank thin liquids with overt s/s oral or pharyngeal difficulty as described above. At this time pt swallowing is deemed WNL/WFL with these POs, given precautions given to pt today. There are no  overt s/s aspiration PNA observed by SLP nor any reported by pt at this time. Pt cont to not complete HEP as directed. SEE NOTE ABOVE for more details. Data indicate that pt's swallow ability will likely decrease over the course of radiation/chemoradiation therapy and could very well decline over time following the conclusion of that therapy due to muscle disuse atrophy and/or muscle fibrosis. Pt will cont to need to be seen by SLP in order to assess safety of PO intake, assess the need for recommending any objective swallow assessment, and ensuring pt is correctly completing the individualized HEP.   OBJECTIVE IMPAIRMENTS include dysphagia. These impairments are limiting patient from safety when swallowing. Factors affecting potential to achieve goals and functional  outcome are previous level of function. Patient will benefit from skilled SLP services to address above impairments and improve overall function.   REHAB POTENTIAL: Good     GOALS: Goals reviewed with patient? No   SHORT TERM GOALS: Target completion:  4 total sessions       pt will complete HEP with modified indpendence in 2 sessions  Baseline: usual mod A     04/17/22 Goal status: met   2.  pt will tell SLP why pt is completing HEP with modified independence Baseline: Total A Goal status: partially met   3.  pt will describe 3 overt s/s aspiration PNA with modified independence Baseline: Not completed yet Goal status: Met   4.  pt will tell SLP how a food journal could hasten return to a more normalized diet Baseline: Not completed yet Goal status: Deferred - due to returning to pre-rad diet     LONG TERM GOALS: Target completion:  7 total sessions      pt will complete HEP with modified independence over four visits Baseline: Usual mod A  06-14-22 Goal status: Ongoing, and cont   2.  pt will describe how to modify HEP over time, and the timeline associated with reduction in HEP frequency with modified independence  over two sessions Baseline: total A Goal status: Ongoing, and cont   PLAN: SLP FREQUENCY:  approx every four weeks   SLP DURATION:  7 total sessions   PLANNED INTERVENTIONS: Aspiration precaution training, Pharyngeal strengthening exercises, Diet toleration management , Trials of upgraded texture/liquids, Internal/external aids, SLP instruction and feedback, Compensatory strategies, and Patient/family education   Patient will benefit from skilled therapeutic intervention in order to improve the following deficits and impairments:   Dysphagia, unspecified type    Problem List Patient Active Problem List   Diagnosis Date Noted   Mucositis due to antineoplastic therapy 03/13/2022   Weight loss, unintentional 03/13/2022   Malignant neoplasm of base of tongue ([place]) 02/20/2022   Counseling regarding goals of care 12/22/2021   Food impaction of esophagus 03/30/2019   Esophageal obstruction due to food impaction    Anemia of chronic disease 01/03/2017   SIRS (systemic inflammatory response syndrome) ([place]) 01/01/2017   Port catheter in place 12/18/2016   Hypersensitivity reaction 11/01/2016   Extravasation accident 11/01/2016   Encounter for nonprocreative genetic counseling 10/31/2016   Malignant neoplasm of lower third of esophagus ([place]) 10/15/2016   COPD (chronic obstructive pulmonary disease) with emphysema ([place]) 07/14/2015   Prediabetes    Asthma    Cerebral palsy with gross motor function classification system level I ([place]) 07/28/59    [place], [place] 08/15/2022, 11:08 AM  [facility] [address] [place], [place], [address] Phone: [phone]   Fax:  [phone]   Name: Darrell Cunningham MRN: [id number] Date of Birth: 12/14/1960
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
## 2022-08-17 ENCOUNTER — Ambulatory Visit: Payer: Medicaid Other

## 2022-08-17 DIAGNOSIS — M542 Cervicalgia: Secondary | ICD-10-CM | POA: Diagnosis not present

## 2022-08-17 DIAGNOSIS — I890 Lymphedema, not elsewhere classified: Secondary | ICD-10-CM

## 2022-08-17 DIAGNOSIS — C760 Malignant neoplasm of head, face and neck: Secondary | ICD-10-CM

## 2022-08-17 DIAGNOSIS — R293 Abnormal posture: Secondary | ICD-10-CM
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
## 2022-08-17 NOTE — Patient Instructions (Signed)
Over Head Pull: Narrow and Wide Grip   Cancer Rehab [phone]   On back, knees bent, feet flat, band across thighs, elbows straight but relaxed. Pull hands apart (start). Keeping elbows straight, bring arms up and over head, hands toward floor. Keep pull steady on band. Hold momentarily. Return slowly, keeping pull steady, back to start. Then do same with a wider grip on the band (past shoulder width) Repeat _5-10__ times. Band color __red____   Side Pull: Double Arm   On back, knees bent, feet flat. Arms perpendicular to body, shoulder level, elbows straight but relaxed. Pull arms out to sides, elbows straight. Resistance band comes across collarbones, hands toward floor. Hold momentarily. Slowly return to starting position. Repeat _5-10__ times. Band color _red____   Sword   On back, knees bent, feet flat, left hand on left hip, right hand above left. Pull right arm DIAGONALLY (hip to shoulder) across chest. Bring right arm along head toward floor. Hold momentarily. Slowly return to starting position. Repeat _5-10__ times. Do with left arm. Band color _red_____   Shoulder Rotation: Double Arm   On back, knees bent, feet flat, elbows tucked at sides, bent 90, hands palms up. Pull hands apart and down toward floor, keeping elbows near sides. Hold momentarily. Slowly return to starting position. Repeat _5-10__ times. Band color __red____

## 2022-08-18 ENCOUNTER — Ambulatory Visit: Payer: Medicaid Other | Admitting: Family Medicine

## 2022-08-18 VITALS — BP 110/62 | HR 105 | Temp 98.1°F | Ht 60.0 in | Wt 115.0 lb

## 2022-08-18 DIAGNOSIS — L304 Erythema intertrigo: Secondary | ICD-10-CM | POA: Diagnosis not present

## 2022-08-18 MED ORDER — CLOTRIMAZOLE-BETAMETHASONE 1-0.05 % EX CREA: 1.0000 | TOPICAL_CREAM | Freq: Two times a day (BID) | CUTANEOUS | 0 refills | Status: DC

## 2022-08-18 NOTE — Progress Notes (Signed)
Subjective:    Patient ID: Darrell Cunningham, male    DOB: [birth year], 62 y.o.   MRN: [id number]  Patient has a history of esophageal adenocarcinoma, cT3N1M0, stage III, ypT0N0.  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  Starting last year while he was undergoing radiation he developed a painful rash in his left axilla.  It is an elliptical shaped rash with crease of his axilla in the center of the ellipse.  The rash is pink with sharp serpiginous borders.  There is also surrounding patches of erythema around this.  Rash appears to be consistent with intertrigo in the left axilla.  No rash exist in his right axilla.  Rash has been there for 4 months.  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 Though he is a candidate for genetic testing, he declines at this time.   GERD (gastroesophageal reflux disease)    History of chemotherapy    03-2017   History of radiation therapy    03-2017   Hypertension    Malignant neoplasm of base of tongue (HCC)    Neuromuscular disorder (HCC)    [name]   Pneumonia 4 yrs ago   Past Surgical History:  Procedure Laterality Date   COLONOSCOPY     COMPLETE ESOPHAGECTOMY N/A 04/16/2017   Procedure: ESOPHAGECTOMY COMPLETE,Transhiatal total esophagectomy;  Surgeon: [name], MD;  Location: [facility] OR;  Service: Thoracic;  Laterality: N/A;   DIRECT LARYNGOSCOPY N/A 01/27/2022   Procedure: DIRECT LARYNGOSCOPY WITH BIOPSIES; FROZEN SECTION;  Surgeon: [name], MD;   Location: [place];  Service: ENT;  Laterality: N/A;   ESOPHAGOGASTRODUODENOSCOPY (EGD) WITH PROPOFOL N/A 03/30/2019   Procedure: ESOPHAGOGASTRODUODENOSCOPY (EGD) WITH PROPOFOL;  Surgeon: Ladene Artist, MD;  Location[facility] ENDOSCOPY;  Service: Endoscopy;  Laterality: N/A;   EUS N/A 10/26/2016   Procedure: UPPER ENDOSCOPIC ULTRASOUND (EUS) LINEAR;  Surgeon: [name], MD;  Location: [facility] ENDOSCOPY;  Service: Endoscopy;  Laterality: N/A;   EYE SURGERY Bilateral age 66    for cross eyes   FOREIGN BODY REMOVAL  03/30/2019   Procedure: FOREIGN BODY REMOVAL;  Surgeon: Ladene Artist, MD;  Location: [place];  Service: Endoscopy;;   IR FLUORO GUIDE PORT INSERTION RIGHT  11/08/2016   right upper chest   IR IMAGING GUIDED PORT INSERTION  02/24/2022   IR REMOVAL TUN ACCESS W/ PORT W/O FL MOD SED  04/12/2020   IR REMOVAL TUN ACCESS W/ PORT W/O FL MOD SED  08/09/2022   IR US GUIDE VASC ACCESS RIGHT  11/08/2016   JEJUNOSTOMY N/A 04/16/2017   Procedure: Donney Rankins;  Surgeon: [name], MD;  Location: [place];  Service: Thoracic;  Laterality: N/A;   JEJUNOSTOMY     removal of feeding tube /pt unsure of date   MOUTH SURGERY     POLYPECTOMY     TONSILLECTOMY Bilateral 01/27/2022   Procedure: POSSIBLE TONSILLECTOMY;  Surgeon: [name], MD;  Location: [place];  Service: ENT;  Laterality: Bilateral;   UPPER GASTROINTESTINAL ENDOSCOPY     UPPER GASTROINTESTINAL ENDOSCOPY  06/03/2021   hx of esoph ca   VIDEO BRONCHOSCOPY N/A 04/16/2017   Procedure: VIDEO BRONCHOSCOPY, Transhiatal Total Esophagectomy, Esophagogastrostomy, pyloromotomy, Feeding Jejunostomy;  Surgeon: [name], MD;  Location: [place];  Service: Thoracic;  Laterality: N/A;   Current Outpatient Medications on File Prior to Visit  Medication Sig Dispense Refill   acetaminophen (TYLENOL) 500 MG tablet Take 500 mg by mouth every 6 (six) hours as needed for moderate pain.     albuterol (VENTOLIN HFA) 108 (90 Base)  MCG/ACT inhaler Inhale 2 puffs into the lungs every 6 (six) hours as needed for wheezing or shortness of breath. 8 g 3   atorvastatin (LIPITOR) 20 MG tablet TAKE 1 TABLET BY MOUTH EVERY DAY 90 tablet 3   benzonatate (TESSALON) 100 MG capsule Take 1 capsule (100 mg total) by mouth 3 (three) times daily as needed for cough. 30 capsule 2   cyclobenzaprine (FLEXERIL) 5 MG tablet Take 1 tablet (5 mg total) by mouth 3 (three) times daily as needed for muscle spasms. 30 tablet 2   fluconazole (DIFLUCAN) 100 MG tablet Take 1 tablet (100 mg total) by mouth daily. (Patient not taking: Reported on 04/17/2022) 30 tablet 0   fluticasone (FLONASE) 50 MCG/ACT nasal spray SPRAY 1 SPRAY INTO EACH NOSTRIL EVERY DAY AS NEEDED FOR ALLERGIES/RHINITIS 16 mL 2   levETIRAcetam (KEPPRA) 500 MG tablet Take 1 tablet (500 mg total) by mouth 2 (two) times daily. 60 tablet 11   pantoprazole (PROTONIX) 40 MG tablet TAKE 1 TABLET BY MOUTH TWICE A DAY 180 tablet 0   rOPINIRole (REQUIP) 1 MG tablet Take 1 tablet (1 mg total) by mouth at bedtime. 30 tablet 5   senna-docusate (SENOKOT-S) 8.6-50 MG tablet Take 2 tablets by mouth 2 (two) times daily. 120 tablet 0   [DISCONTINUED] prochlorperazine (COMPAZINE) 10 MG tablet Take 1 tablet (10 mg total) by mouth every 6 (six) hours as needed (Nausea or vomiting). 30 tablet 1   Current Facility-Administered Medications on File Prior to Visit  Medication Dose Route Frequency Provider Last Rate Last Admin   palonosetron (ALOXI) 0.25 MG/5ML injection            Allergies  Allergen Reactions   Penicillins Hives, Itching and Swelling    Has patient had a PCN reaction causing immediate rash, facial/tongue/throat swelling, SOB or lightheadedness with hypotension: No Has patient had a PCN reaction causing severe rash involving mucus membranes or skin necrosis: No Has patient had a PCN reaction that required hospitalization No Has patient had a PCN reaction occurring within the last 10 years:  No If all of the above answers are "NO", then may proceed with Cephalosporin use. Unknown Childhood reaction   Social History   Socioeconomic History   Marital status: Legally Separated    Spouse name: Not on file   Number of children: Not on file   Years of education: Not on file   Highest education level: Not on file  Occupational History   Not on file  Tobacco Use   Smoking status: Former    Packs/day: 1.00    Years: 38.00    Total pack years: 38.00    Types: Cigarettes    Start date: 10/30/2012    Quit date: 10/31/2012    Years since quitting: 9.8   Smokeless tobacco: Never  Vaping Use  Vaping Use: Never used  Substance and Sexual Activity   Alcohol use: No    Alcohol/week: 0.0 standard drinks of alcohol   Drug use: No   Sexual activity: Not on file  Other Topics Concern   Not on file  Social History Narrative   Not on file   Social Determinants of Health   Financial Resource Strain: Medium Risk (02/03/2022)   Overall Financial Resource Strain (CARDIA)    Difficulty of Paying Living Expenses: Somewhat hard  Food Insecurity: Not on file  Transportation Needs: No Transportation Needs (02/03/2022)   PRAPARE - Hydrologist (Medical): No    Lack of Transportation (Non-Medical): No  Physical Activity: Not on file  Stress: Not on file  Social Connections: Not on file  Intimate Partner Violence: Not on file     Review of Systems  All other systems reviewed and are negative.      Objective:   Physical Exam Vitals reviewed.  Constitutional:      General: He is not in acute distress.    Appearance: Normal appearance. He is normal weight. He is not ill-appearing, toxic-appearing or diaphoretic.  HENT:     Head: Normocephalic.     Mouth/Throat:     Mouth: Mucous membranes are moist.  Eyes:     General: No scleral icterus.       Right eye: No discharge.        Left eye: No discharge.     Conjunctiva/sclera: Conjunctivae normal.      Pupils: Pupils are equal, round, and reactive to light.  Neck:     Vascular: No carotid bruit.  Cardiovascular:     Rate and Rhythm: Normal rate and regular rhythm.     Pulses: Normal pulses.     Heart sounds: Normal heart sounds. No murmur heard.    No friction rub. No gallop.  Pulmonary:     Effort: Pulmonary effort is normal. No respiratory distress.     Breath sounds: No stridor. No wheezing or rales.  Musculoskeletal:     Cervical back: Neck supple. No rigidity or tenderness.     Right lower leg: No edema.     Left lower leg: No edema.  Lymphadenopathy:     Cervical: No cervical adenopathy.  Skin:    Coloration: Skin is not jaundiced or pale.     Findings: Erythema and rash present. No bruising or lesion.  Neurological:     Mental Status: He is alert.           Assessment & Plan:  Intertrigo I believe this is intertrigo.  Trial Lotrisone cream twice daily for 14 days.  Recheck if not improving or sooner if worsening.

## 2022-08-21 ENCOUNTER — Ambulatory Visit: Payer: Medicaid Other | Admitting: Physical Therapy

## 2022-08-23 ENCOUNTER — Ambulatory Visit: Payer: Medicaid Other

## 2022-08-23 DIAGNOSIS — R293 Abnormal posture: Secondary | ICD-10-CM

## 2022-08-23 DIAGNOSIS — I890 Lymphedema, not elsewhere classified: Secondary | ICD-10-CM

## 2022-08-23 DIAGNOSIS — C760 Malignant neoplasm of head, face and neck: Secondary | ICD-10-CM

## 2022-08-23 DIAGNOSIS — M542 Cervicalgia: Secondary | ICD-10-CM

## 2022-08-23 NOTE — Patient Instructions (Signed)
3 Way Raises:      Starting Position:  Leaning against wall, walk feet a few inches away from the wall and make tummy tight (tuck hips underneath you) Press back/shoulders/head against wall as much as possible. Keep thumbs up to ceiling, elbows straight and shoulders relaxed/down throughout.  1. Lift arms in front to shoulder height 2. Lift arms a little wider into a "V" to shoulder height 3. Lift arms out to sides in a "T" to shoulder height  Perform 10 times in each direction. Hold 1-2 lbs to start with and work up to 2-3 sets of 10/day. Perform 3-4 times/week. Increase weight as able, decreasing sets of 10 each time you increase weights, then slowly working your way back up to 2-3 sets each time.    Cancer Rehab [phone]
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
## 2022-08-29 ENCOUNTER — Encounter: Payer: Medicaid Other | Admitting: Physical Therapy

## 2022-08-31 DIAGNOSIS — G930 Cerebral cysts: Secondary | ICD-10-CM

## 2022-08-31 HISTORY — DX: Cerebral cysts: G93.0

## 2022-09-05 ENCOUNTER — Encounter: Payer: Medicaid Other | Admitting: Physical Therapy

## 2022-09-07 ENCOUNTER — Encounter: Payer: Medicaid Other | Admitting: Physical Therapy

## 2022-09-12 ENCOUNTER — Ambulatory Visit: Payer: Medicaid Other | Admitting: Nurse Practitioner

## 2022-09-12 ENCOUNTER — Encounter: Payer: Self-pay | Admitting: Nurse Practitioner

## 2022-09-12 ENCOUNTER — Other Ambulatory Visit (INDEPENDENT_AMBULATORY_CARE_PROVIDER_SITE_OTHER): Payer: Medicaid Other

## 2022-09-12 VITALS — BP 100/60 | HR 95 | Ht 61.0 in | Wt 115.8 lb

## 2022-09-12 DIAGNOSIS — Z8501 Personal history of malignant neoplasm of esophagus: Secondary | ICD-10-CM | POA: Diagnosis not present

## 2022-09-12 DIAGNOSIS — R1310 Dysphagia, unspecified: Secondary | ICD-10-CM

## 2022-09-12 LAB — COMPREHENSIVE METABOLIC PANEL
ALT: 15 U/L (ref 0–53)
AST: 17 U/L (ref 0–37)
Albumin: 4.5 g/dL (ref 3.5–5.2)
Alkaline Phosphatase: 81 U/L (ref 39–117)
BUN: 14 mg/dL (ref 6–23)
CO2: 31 mEq/L (ref 19–32)
Calcium: 10 mg/dL (ref 8.4–10.5)
Chloride: 99 mEq/L (ref 96–112)
Creatinine, Ser: 0.91 mg/dL (ref 0.40–1.50)
GFR: 89.7 mL/min (ref >60.00)
Glucose, Bld: 95 mg/dL (ref 70–99)
Potassium: 4.8 mEq/L (ref 3.5–5.1)
Sodium: 136 mEq/L (ref 135–145)
Total Bilirubin: 0.5 mg/dL (ref 0.2–1.2)
Total Protein: 7.6 g/dL (ref 6.0–8.3)

## 2022-09-12 LAB — CBC WITH DIFFERENTIAL/PLATELET
Basophils Absolute: 0 10*3/uL (ref 0.0–0.1)
Basophils Relative: 0.7 % (ref 0.0–3.0)
Eosinophils Absolute: 0.2 10*3/uL (ref 0.0–0.7)
Eosinophils Relative: 3.5 % (ref 0.0–5.0)
HCT: 42.7 % (ref 39.0–52.0)
Hemoglobin: 14.6 g/dL (ref 13.0–17.0)
Lymphocytes Relative: 13.2 % (ref 12.0–46.0)
Lymphs Abs: 0.8 10*3/uL (ref 0.7–4.0)
MCHC: 34.2 g/dL (ref 30.0–36.0)
MCV: 92.1 fl (ref 78.0–100.0)
Monocytes Absolute: 0.6 10*3/uL (ref 0.1–1.0)
Monocytes Relative: 11.1 % (ref 3.0–12.0)
Neutro Abs: 4.1 10*3/uL (ref 1.4–7.7)
Neutrophils Relative %: 71.5 % (ref 43.0–77.0)
Platelets: 230 10*3/uL (ref 150.0–400.0)
RBC: 4.64 Mil/uL (ref 4.22–5.81)
RDW: 13.7 % (ref 11.5–15.5)
WBC: 5.7 10*3/uL (ref 4.0–10.5)

## 2022-09-12 NOTE — Patient Instructions (Addendum)
Our office will contact you to schedule an EGD.  Contact Colleen,NP with the name of your neurologist.  Your provider has requested that you go to the basement level for lab work before leaving today. Press "B" on the elevator. The lab is located at the first door on the left as you exit the elevator.  Due to recent changes in healthcare laws, you may see the results of your imaging and laboratory studies on MyChart before your provider has had a chance to review them.  We understand that in some cases there may be results that are confusing or concerning to you. Not all laboratory results come back in the same time frame and the provider may be waiting for multiple results in order to interpret others.  Please give [place] 48 hours in order for your provider to thoroughly review all the results before contacting the office for clarification of your results.   Thank you for trusting me with your gastrointestinal care!   Carl Best, CRNP

## 2022-09-12 NOTE — Progress Notes (Addendum)
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 He is known by Dr. [name]. He is accompanied by his daughter, Urban Gibson. He endorses having recurrent dysphagia which comes and goes and has occurred 4-5 times over the past 2 to 3 months.  He describes having food which gets stuck to the upper esophagus, he drinks soda to force the food down and sometimes he regurgitates the food out.  He denies having any difficulty swallowing his saliva during these episodes.  No heartburn.  No upper or lower abdominal pain. He is taking Pantoprazole 40 mg twice daily. His most recent EGD was 06/03/2021 which identified an intact esophagogastric anastomosis without recurrent malignancy which was dilated to 20 mm and a single nonbleeding gastric AVM. He was last seen in office by Dr. [name] 05/11/2022. At that time, the patient completed his therapy for base of tongue cancer and his dysphagia symptoms were infrequent. Dr. [name] wanted to wait for his PET scan to ensure his tongue cancer was completely resolved prior to pursuing a repeat EGD with esophageal dilatation. Fortunately, his PET scan 06/26/2022 showed a good response to treatment without evidence of residual malignancy or metastatic disease.  He is passing a normal formed brown stool most days.  No rectal bleeding or black stools.  He has a history of absence seizure's which occurs "every once in a while" followed by neurology.  His most recent absence seizure occurred Sunday 09/03/2022 while he was driving to church.   He was seen by his neurologist and his Keppra dose was increased.  He is scheduled to see his neurologist for follow-up later this month.      Latest Ref Rng & Units 06/05/2022    9:57 AM 04/26/2022    2:31 PM 04/04/2022    8:44 AM  CBC  WBC 4.0 - 10.5 K/uL 5.8  5.0  1.4   Hemoglobin 13.0 - 17.0 g/dL 12.0  10.5  12.3   Hematocrit 39.0 - 52.0 % 34.3  29.9  34.8   Platelets 150 - 400 K/uL 195  256  145        Latest Ref Rng & Units 06/05/2022    9:57 AM 04/26/2022    2:31 PM 04/04/2022    8:44 AM  CMP  Glucose 70 - 99 mg/dL 92  90  136   BUN 8 - 23 mg/dL 17  14  21   $ Creatinine 0.61 - 1.24 mg/dL 0.65  0.62  0.65   Sodium 135 - 145 mmol/L 138  141  134   Potassium 3.5 - 5.1 mmol/L 4.3  3.7  4.5   Chloride 98 - 111 mmol/L 105  109  100   CO2 22 - 32 mmol/L 28  26  29   $ Calcium 8.9 - 10.3 mg/dL 9.1  9.0  9.2   Total Protein 6.5 - 8.1 g/dL 6.6  6.4    Total Bilirubin 0.3 - 1.2 mg/dL 0.5  0.4    Alkaline Phos 38 - 126 U/L 78  74    AST 15 - 41 U/L 13  18    ALT 0 - 44 U/L 10  19       MOST RECENT GI PROCEDURES:  EGD 06/03/2021:  An esophagogastric anastomosis was found without esophagitis or recurrent malignancy Dilated to 20 mm with balloon A single nonbleeding angiectasia in the stomach Normal examined duodenum No specimens collected  Colonoscopy 03/17/2020: One 57m polyp at the splenic flexure, removed with a cold snare.  Complete resection.  Polyp tissue not retrieved. Diverticulosis in the sigmoid colon and in the descending colon.  Current Outpatient Medications on File Prior to Visit  Medication Sig Dispense Refill   acetaminophen (TYLENOL) 500 MG tablet Take 500 mg by mouth every 6 (six) hours as needed for moderate pain.     albuterol (VENTOLIN HFA) 108 (90 Base) MCG/ACT inhaler Inhale 2 puffs into the lungs every 6 (six) hours as needed for wheezing or shortness of breath. 8 g 3   atorvastatin (LIPITOR) 20 MG tablet TAKE 1 TABLET BY MOUTH EVERY DAY 90 tablet 3    clotrimazole-betamethasone (LOTRISONE) cream Apply 1 Application topically 2 (two) times daily. 30 g 0   cyclobenzaprine (FLEXERIL) 5 MG tablet Take 1 tablet (5 mg total) by mouth 3 (three) times daily as needed for muscle spasms. 30 tablet 2   fluticasone (FLONASE) 50 MCG/ACT nasal spray SPRAY 1 SPRAY INTO EACH NOSTRIL EVERY DAY AS NEEDED FOR ALLERGIES/RHINITIS 16 mL 2   levETIRAcetam (KEPPRA) 500 MG tablet Take 1 tablet (500 mg total) by mouth 2 (two) times daily. 60 tablet 11   pantoprazole (PROTONIX) 40 MG tablet TAKE 1 TABLET BY MOUTH TWICE A DAY 180 tablet 0   [DISCONTINUED] prochlorperazine (COMPAZINE) 10 MG tablet Take 1 tablet (10 mg total) by mouth every 6 (six) hours as needed (Nausea or vomiting). 30 tablet 1   Current Facility-Administered Medications on File Prior to Visit  Medication Dose Route Frequency Provider Last Rate Last Admin   palonosetron (ALOXI) 0.25 MG/5ML injection            Allergies  Allergen Reactions   Penicillins Hives, Itching and Swelling    Has patient had a PCN reaction causing immediate rash, facial/tongue/throat swelling, SOB or lightheadedness with hypotension: No Has patient had a PCN reaction causing severe rash involving mucus membranes or skin necrosis: No Has patient had a PCN reaction that required hospitalization No Has patient had a PCN reaction occurring within the last 10 years: No If all of the above answers are "NO", then may proceed with Cephalosporin use. Unknown Childhood reaction     Current Medications, Allergies, Past Medical History, Past Surgical History, Family History and Social History were reviewed in [employer].  Review of Systems:   Constitutional: Negative for fever, sweats, chills or weight loss.  Respiratory: Negative for shortness of breath.   Cardiovascular: Negative for chest pain, palpitations and leg swelling.  Gastrointestinal: See HPI.  Musculoskeletal: Negative for back pain or  muscle aches.  Neurological: Negative for dizziness, headaches or paresthesias.   Physical Exam: BP 100/60   Pulse 95   Ht 5' 1"$  (1.549 m)   Wt 115 lb 12.8 oz (52.5 kg)   SpO2 96%   BMI 21.88 kg/m   Wt Readings from Last 3 Encounters:  09/12/22 115 lb 12.8 oz (52.5 kg)  08/18/22 115 lb (52.2 kg)  07/20/22 113 lb 1.6 oz (51.3 kg)    General: 62year old male in no acute distress. Head: Normocephalic and atraumatic. Eyes: No scleral icterus. Conjunctiva pink . Ears: Normal auditory acuity. Mouth: Dentures intact. No ulcers  or lesions.  Lungs: Clear throughout to auscultation. Heart: Regular rate and rhythm, no murmur. Abdomen: Soft, nontender and nondistended. No masses or hepatomegaly. Normal bowel sounds x 4 quadrants. Midline abdominal scar and LUQ (G-tube site) scar intact.  Rectal: Deferred.  Musculoskeletal: Symmetrical with no gross deformities. Extremities: No edema. Neurological: Alert oriented x 4. No focal deficits.  Psychological: Alert and cooperative. Normal mood and affect  Assessment and Recommendations:  65) 62 year old male with a history of esophageal adenocarcinoma s/p chemo and esophagectomy 2018, gastroesophageal anastomotic stricture last dilated per EGD 05/2021 with recurrent dysphagia for the past 2 to 3 months.  -EGD benefits and risks discussed including risk with sedation, risk of bleeding, perforation and infection. -Due to recent absence seizure 09/03/2022, EGD to be scheduled at [facility]. ADDENDUM: Patient cleared to have EGD at [facility] per Dr. [name] and Osvaldo Angst CRNA. Neuro clearance received 09/28/2022 by Dr. Audelia Acton " From neurology standpoint there is no absolute contradiction to the planned endoscopic procedure. Refer to letter scanned under media file.  -Continue Pantoprazole '40mg'$  p.o. twice daily -Patient instructed to cut food into small pieces and chew food thoroughly.  Avoid eating large pieces of bread/meat or rice. -Patient to contact office if  symptoms worsen   2) Base of tongue cancer, treated with chemo and radiation, PET scan 06/26/2022 showed a good response to treatment without evidence of residual malignancy or metastatic disease.   3) History of colon polyps, small adenoma removed in August 2021, repeat colonoscopy recommended August 2028  4) History of absence seizure's since 05/2021 which last occurred while he was driving on [id number], Keppra dose increased per neurology -Patient to follow-up with neurology in 2 weeks as scheduled -Patient instructed to contact office with the name of his neurologist, I will request neuro clearance prior to patient proceeding with an EGD

## 2022-09-13 ENCOUNTER — Telehealth: Payer: Self-pay

## 2022-09-13 ENCOUNTER — Other Ambulatory Visit: Payer: Self-pay

## 2022-09-13 NOTE — Telephone Encounter (Signed)
Message Received: Darrell Cunningham, Patrecia Pour, NP  Pyrtle, [name], MD; Gillermina Hu, RN Dr. [name], I will request a neuro clearance as patient has a hx of absence seizures since 05/2021, last occurred 09/03/2022. I believe his procedure will need to be scheduled at [facility] but let me know if you advise otherwise.  Darrell Cunningham, patient was unable to provide me with the name of his neurologist.  Please contact the patient tomorrow and obtain the name of his neurologist and send an official request for neuro clearance as the patient is scheduled to see his neurologist in 2 weeks.  Thank you.

## 2022-09-16 ENCOUNTER — Other Ambulatory Visit: Payer: Self-pay | Admitting: Family Medicine

## 2022-09-16 DIAGNOSIS — K219 Gastro-esophageal reflux disease without esophagitis: Secondary | ICD-10-CM

## 2022-09-18 NOTE — Telephone Encounter (Signed)
Requested Prescriptions  Pending Prescriptions Disp Refills   pantoprazole (PROTONIX) 40 MG tablet [Pharmacy Med Name: PANTOPRAZOLE SOD DR 40 MG TAB] 180 tablet 3    Sig: TAKE 1 TABLET BY MOUTH TWICE A DAY     Gastroenterology: Proton Pump Inhibitors Failed - 09/16/2022 12:34 PM      Failed - Valid encounter within last 12 months    Recent Outpatient Visits           1 year ago New onset seizure ([place])   [place] Pickard, [name], MD   1 year ago Prostate cancer screening   [place] [name], MD   1 year ago COPD exacerbation [facility])   [facility] Eulogio Bear, NP   2 years ago Malignant neoplasm of lower third of esophagus ([place])   [facility] [name], MD   4 years ago Hypotension due to drugs   [place] [facility], [name], MD

## 2022-09-18 NOTE — Progress Notes (Signed)
Addendum: Reviewed and agree with assessment and management plan. He has had many EGD at [facility], which should be okay assuming seizure disorder is felt to be controlled.  This would require neuro opinion  Lacreasha Hinds, [name], MD

## 2022-09-20 NOTE — Progress Notes (Signed)
Dr. [name] and Jenny Reichmann, Patient has undergone numerous procedures in [place] without any problems, last procedure was 06/03/2021. However, he developed new onset seizures 06/19/2021 for which he was seen in the ED and subsequently underwent neuro evaluation.   John, I have asked for neuro clearance because patient had recent absence seizure 09/03/2022 and Keppra was increased. Once neuro clearance received, is patient a candidate for procedure at [facility]?  Thank you, Jaclyn Shaggy

## 2022-09-21 NOTE — Progress Notes (Signed)
Darrell Cunningham, I previously requested neuro clearance. Since I did request it, pls contact neurologist to verify if patient ok to proceed with EGD from neuro perspective. If so, ok to schedule EGD with Dr. [name] at [facility]. THX

## 2022-09-25 ENCOUNTER — Telehealth: Payer: Self-pay

## 2022-09-25 NOTE — Telephone Encounter (Signed)
Message Received: 4 days ago Noralyn Pick, NP  Gillermina Hu, RN      Previous Messages  Routed Note  Author: Noralyn Pick, NP Service: Gastroenterology Author Type: Nurse Practitioner  Filed: 09/21/2022  6:39 PM Encounter Date: 09/12/2022 Status: Signed  Editor: Noralyn Pick, NP (Nurse Practitioner)   Darrell Cunningham, I previously requested neuro clearance. Since I did request it, pls contact neurologist to verify if patient ok to proceed with EGD from neuro perspective. If so, ok to schedule EGD with Dr. [name] at [facility]. THX

## 2022-09-26 NOTE — Telephone Encounter (Signed)
Pt stated that he recently seen his Neurologist and that we can reach out to him to receive neuro  clearance for his EGD.  Previous letter created for Dr. Audelia Acton faxed to his office at [phone] to receive clearance . Conformation page received. Awaiting response.

## 2022-09-26 NOTE — Telephone Encounter (Signed)
Please see note below [facility]

## 2022-09-26 NOTE — Telephone Encounter (Signed)
Patient is calling states he had his appointment with his Neurologist so is looking for the nurse to get in touch with them to discuss procedure clearance. Please advise

## 2022-09-27 NOTE — Telephone Encounter (Signed)
Letter faxed to [facility].

## 2022-09-28 ENCOUNTER — Telehealth: Payer: Self-pay

## 2022-09-28 ENCOUNTER — Other Ambulatory Visit: Payer: Self-pay

## 2022-09-28 DIAGNOSIS — Z8501 Personal history of malignant neoplasm of esophagus: Secondary | ICD-10-CM

## 2022-09-28 DIAGNOSIS — R1310 Dysphagia, unspecified: Secondary | ICD-10-CM

## 2022-09-28 NOTE — Telephone Encounter (Signed)
Spoke with patient and informed pt that his neurologist stated "From neuology standpoint, there is no absolute contradiction to the planned Endoscopic procedure.'

## 2022-09-29 NOTE — Telephone Encounter (Signed)
Informed by  CMA Denyzha that Neuro clearance received and pt scheduled for EGD.

## 2022-10-16 ENCOUNTER — Ambulatory Visit: Payer: Medicaid Other | Attending: Radiation Oncology

## 2022-10-16 DIAGNOSIS — R1310 Dysphagia, unspecified: Secondary | ICD-10-CM | POA: Insufficient documentation
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
## 2022-11-06 ENCOUNTER — Encounter: Payer: Self-pay | Admitting: Internal Medicine

## 2022-11-06 ENCOUNTER — Ambulatory Visit (AMBULATORY_SURGERY_CENTER): Payer: Medicaid Other | Admitting: Internal Medicine

## 2022-11-06 VITALS — BP 111/72 | HR 74 | Temp 96.8°F | Resp 18 | Ht 61.0 in | Wt 115.0 lb

## 2022-11-06 DIAGNOSIS — K31819 Angiodysplasia of stomach and duodenum without bleeding: Secondary | ICD-10-CM

## 2022-11-06 DIAGNOSIS — K222 Esophageal obstruction: Secondary | ICD-10-CM | POA: Diagnosis not present

## 2022-11-06 DIAGNOSIS — R1310 Dysphagia, unspecified: Secondary | ICD-10-CM

## 2022-11-06 DIAGNOSIS — Z8501 Personal history of malignant neoplasm of esophagus: Secondary | ICD-10-CM

## 2022-11-06 MED ORDER — SODIUM CHLORIDE 0.9 % IV SOLN: 500.0000 mL | Freq: Once | INTRAVENOUS | Status: DC

## 2022-11-06 NOTE — Progress Notes (Signed)
Called to room to assist during endoscopic procedure.  Patient ID and intended procedure confirmed with present staff. Received instructions for my participation in the procedure from the performing physician.  

## 2022-11-06 NOTE — Op Note (Signed)
[facility] Patient Name: Darrell Cunningham Procedure Date: 11/06/2022 10:23 AM MRN: [id number] Endoscopist: Beverley Fiedler , MD, [id number] Age: 62 Referring MD:  Date of Birth: 01/26/1961 Gender: Male Account #: [id number] Procedure:                Upper GI endoscopy Indications:              Dysphagia, Malignant esophageal adenocarcinoma                            (2018 s/p esophagectomy with known anastomotic                            stricture s/p multiple dilations and needle-knife                            therapy); more recent base of tongue cancer with                            chemo/XRT Medicines:                Monitored Anesthesia Care Procedure:                Pre-Anesthesia Assessment:                           - Prior to the procedure, a History and Physical                            was performed, and patient medications and                            allergies were reviewed. The patient's tolerance of                            previous anesthesia was also reviewed. The risks                            and benefits of the procedure and the sedation                            options and risks were discussed with the patient.                            All questions were answered, and informed consent                            was obtained. Prior Anticoagulants: The patient has                            taken no anticoagulant or antiplatelet agents. ASA                            Grade Assessment: III - A patient with severe  systemic disease. After reviewing the risks and                            benefits, the patient was deemed in satisfactory                            condition to undergo the procedure.                           After obtaining informed consent, the endoscope was                            passed under direct vision. Throughout the                            procedure, the patient's blood pressure, pulse, and                             oxygen saturations were monitored continuously. The                            GIF [id number] #[id number] was introduced through the                            mouth, and advanced to the second part of duodenum.                            The upper GI endoscopy was accomplished without                            difficulty. The patient tolerated the procedure                            well. Scope In: Scope Out: Findings:                 A subtotal esophagectomy/gastric pull-through                            anastomosis was found in the upper third of the                            esophagus.                           One benign-appearing, intrinsic moderate                            (circumferential scarring or stenosis; an endoscope                            may pass) stenosis was found 21 cm from the                            incisors. This stenosis measured 1.4 cm (inner  diameter) x 1 cm (in length). The stenosis was                            traversed. A TTS dilator was passed through the                            scope. Dilation with an 18-19-20 mm balloon dilator                            was performed to 20 mm. The dilation site was                            examined and showed moderate mucosal disruption.                           A single 3 mm angioectasia with no bleeding was                            found in the gastric body.                           The examined duodenum was normal. Complications:            No immediate complications. Estimated Blood Loss:     Estimated blood loss was minimal. Impression:               - A subtotal esophagectomy anastomosis was found at                            21 cm.                           - Benign-appearing esophageal stenosis at                            gastroesophageal anastomosis. Dilated to 20 mm with                            balloon.                           - A  single non-bleeding angioectasia in the stomach.                           - Normal examined duodenum.                           - No specimens collected. Recommendation:           - Patient has a contact number available for                            emergencies. The signs and symptoms of potential                            delayed complications were discussed with the  patient. Return to normal activities tomorrow.                            Written discharge instructions were provided to the                            patient.                           - Resume previous diet.                           - Continue present medications.                           - Repeat upper endoscopy PRN for retreatment. Beverley Fiedler, MD 11/06/2022 10:45:40 AM This report has been signed electronically.

## 2022-11-06 NOTE — Progress Notes (Signed)
GASTROENTEROLOGY PROCEDURE H&P NOTE   Primary Care Physician: Donita Brooks, MD    Reason for Procedure:  Dysphagia and patient with personal history of esophageal cancer  Plan:    EGD  Patient is appropriate for endoscopic procedure(s) in the ambulatory ([facility]) setting.  The nature of the procedure, as well as the risks, benefits, and alternatives were carefully and thoroughly reviewed with the patient. Ample time for discussion and questions allowed. The patient understood, was satisfied, and agreed to proceed.     HPI: Darrell Cunningham is a 62 y.o. male who presents for EGD with probable dilation.  Medical history as below.  No recent chest pain or shortness of breath.  No abdominal pain today.  Past Medical History:  Diagnosis Date   Allergy    Arthritis    Asthma    as a child   Brain cyst 08/2022   dx by neurosurgeion   Cancer 10/09/2016   ESOPHAGUS CARCINOMA    COPD (chronic obstructive pulmonary disease)    CP (cerebral palsy), spastic    right   Dysphagia    Dyspnea    with exersion   Emphysema of lung    Encounter for nonprocreative genetic counseling 10/31/2016   Darrell Cunningham underwent genetic counseling for hereditary cancer syndromes on 10/31/2016. Though he is a candidate for genetic testing, he declines at this time.   GERD (gastroesophageal reflux disease)    History of chemotherapy    03-2017   History of radiation therapy    03-2017   Hypertension    Malignant neoplasm of base of tongue    Neuromuscular disorder    [name]   Pneumonia 4 yrs ago   Seizure     Past Surgical History:  Procedure Laterality Date   COLONOSCOPY     COMPLETE ESOPHAGECTOMY N/A 04/16/2017   Procedure: ESOPHAGECTOMY COMPLETE,Transhiatal total esophagectomy;  Surgeon: Delight Ovens, MD;  Location: [facility] OR;  Service: Thoracic;  Laterality: N/A;   DIRECT LARYNGOSCOPY N/A 01/27/2022   Procedure: DIRECT LARYNGOSCOPY WITH BIOPSIES; FROZEN SECTION;  Surgeon: Serena Colonel, MD;  Location: [facility] OR;  Service: ENT;  Laterality: N/A;   ESOPHAGOGASTRODUODENOSCOPY (EGD) WITH PROPOFOL N/A 03/30/2019   Procedure: ESOPHAGOGASTRODUODENOSCOPY (EGD) WITH PROPOFOL;  Surgeon: Meryl Dare, MD;  Location: [facility] ENDOSCOPY;  Service: Endoscopy;  Laterality: N/A;   EUS N/A 10/26/2016   Procedure: UPPER ENDOSCOPIC ULTRASOUND (EUS) LINEAR;  Surgeon: Rachael Fee, MD;  Location: [facility] ENDOSCOPY;  Service: Endoscopy;  Laterality: N/A;   EYE SURGERY Bilateral age 33    for cross eyes   FOREIGN BODY REMOVAL  03/30/2019   Procedure: FOREIGN BODY REMOVAL;  Surgeon: Meryl Dare, MD;  Location: [facility] ENDOSCOPY;  Service: Endoscopy;;   IR FLUORO GUIDE PORT INSERTION RIGHT  11/08/2016   right upper chest   IR IMAGING GUIDED PORT INSERTION  02/24/2022   IR REMOVAL TUN ACCESS W/ PORT W/O FL MOD SED  04/12/2020   IR REMOVAL TUN ACCESS W/ PORT W/O FL MOD SED  08/09/2022   IR US GUIDE VASC ACCESS RIGHT  11/08/2016   JEJUNOSTOMY N/A 04/16/2017   Procedure: Darrell Cunningham;  Surgeon: Delight Ovens, MD;  Location: [facility] OR;  Service: Thoracic;  Laterality: N/A;   JEJUNOSTOMY     removal of feeding tube /pt unsure of date   MOUTH SURGERY     POLYPECTOMY     TONSILLECTOMY Bilateral 01/27/2022   Procedure: POSSIBLE TONSILLECTOMY;  Surgeon: Serena Colonel, MD;  Location: [facility]  OR;  Service: ENT;  Laterality: Bilateral;   UPPER GASTROINTESTINAL ENDOSCOPY     UPPER GASTROINTESTINAL ENDOSCOPY  06/03/2021   hx of esoph ca   VIDEO BRONCHOSCOPY N/A 04/16/2017   Procedure: VIDEO BRONCHOSCOPY, Transhiatal Total Esophagectomy, Esophagogastrostomy, pyloromotomy, Feeding Jejunostomy;  Surgeon: Delight OvensGerhardt, Edward B, MD;  Location: [facility] OR;  Service: Thoracic;  Laterality: N/A;    Prior to Admission medications   Medication Sig Start Date End Date Taking? Authorizing Provider  atorvastatin (LIPITOR) 20 MG tablet TAKE 1 TABLET BY MOUTH EVERY DAY 03/13/22  Yes Donita BrooksPickard, Warren T, MD  levETIRAcetam (KEPPRA) 500  MG tablet Take 1 tablet (500 mg total) by mouth 2 (two) times daily. 05/22/22  Yes Donita BrooksPickard, Warren T, MD  pantoprazole (PROTONIX) 40 MG tablet TAKE 1 TABLET BY MOUTH TWICE A DAY 09/18/22  Yes Donita BrooksPickard, Warren T, MD  acetaminophen (TYLENOL) 500 MG tablet Take 500 mg by mouth every 6 (six) hours as needed for moderate pain.    [provider]  albuterol (VENTOLIN HFA) 108 (90 Base) MCG/ACT inhaler Inhale 2 puffs into the lungs every 6 (six) hours as needed for wheezing or shortness of breath. 04/25/21   Donita BrooksPickard, Warren T, MD  Budeson-Glycopyrrol-Formoterol (BREZTRI AEROSPHERE) 160-9-4.8 MCG/ACT AERO Inhale 1 puff into the lungs daily as needed. 01/19/21   [provider]  clotrimazole-betamethasone (LOTRISONE) cream Apply 1 Application topically 2 (two) times daily. 08/18/22   Donita BrooksPickard, Warren T, MD  cyclobenzaprine (FLEXERIL) 5 MG tablet Take 1 tablet (5 mg total) by mouth 3 (three) times daily as needed for muscle spasms. 06/29/22   Donita BrooksPickard, Warren T, MD  fluticasone (FLONASE) 50 MCG/ACT nasal spray SPRAY 1 SPRAY INTO EACH NOSTRIL EVERY DAY AS NEEDED FOR ALLERGIES/RHINITIS 10/03/21   Donita BrooksPickard, Warren T, MD  rOPINIRole (REQUIP) 1 MG tablet Take 1 mg by mouth at bedtime. 01/10/22   [provider]  prochlorperazine (COMPAZINE) 10 MG tablet Take 1 tablet (10 mg total) by mouth every 6 (six) hours as needed (Nausea or vomiting). 02/24/22 04/04/22  Rachel MouldsIruku, Praveena, MD    Current Outpatient Medications  Medication Sig Dispense Refill   atorvastatin (LIPITOR) 20 MG tablet TAKE 1 TABLET BY MOUTH EVERY DAY 90 tablet 3   levETIRAcetam (KEPPRA) 500 MG tablet Take 1 tablet (500 mg total) by mouth 2 (two) times daily. 60 tablet 11   pantoprazole (PROTONIX) 40 MG tablet TAKE 1 TABLET BY MOUTH TWICE A DAY 180 tablet 3   acetaminophen (TYLENOL) 500 MG tablet Take 500 mg by mouth every 6 (six) hours as needed for moderate pain.     albuterol (VENTOLIN HFA) 108 (90 Base) MCG/ACT inhaler Inhale 2 puffs  into the lungs every 6 (six) hours as needed for wheezing or shortness of breath. 8 g 3   Budeson-Glycopyrrol-Formoterol (BREZTRI AEROSPHERE) 160-9-4.8 MCG/ACT AERO Inhale 1 puff into the lungs daily as needed.     clotrimazole-betamethasone (LOTRISONE) cream Apply 1 Application topically 2 (two) times daily. 30 g 0   cyclobenzaprine (FLEXERIL) 5 MG tablet Take 1 tablet (5 mg total) by mouth 3 (three) times daily as needed for muscle spasms. 30 tablet 2   fluticasone (FLONASE) 50 MCG/ACT nasal spray SPRAY 1 SPRAY INTO EACH NOSTRIL EVERY DAY AS NEEDED FOR ALLERGIES/RHINITIS 16 mL 2   rOPINIRole (REQUIP) 1 MG tablet Take 1 mg by mouth at bedtime.     Current Facility-Administered Medications  Medication Dose Route Frequency Provider Last Rate Last Admin   0.9 %  sodium chloride infusion  500  mL Intravenous Once Harleigh Civello, Carie Caddy, MD       Facility-Administered Medications Ordered in Other Visits  Medication Dose Route Frequency Provider Last Rate Last Admin   palonosetron (ALOXI) 0.25 MG/5ML injection             Allergies as of 11/06/2022 - Review Complete 11/06/2022  Allergen Reaction Noted   Penicillins Hives, Itching, and Swelling 11/01/2012    Family History  Problem Relation Age of Onset   COPD Father    Breast cancer Sister 24       Deceased at 92 of breast cancer   Ovarian cancer Maternal Aunt    Colon cancer Maternal Grandmother 6   Breast cancer Maternal Grandmother 35   Stomach cancer Neg Hx    Esophageal cancer Neg Hx    Colon polyps Neg Hx    Rectal cancer Neg Hx     Social History   Socioeconomic History   Marital status: Legally Separated    Spouse name: Not on file   Number of children: Not on file   Years of education: Not on file   Highest education level: Not on file  Occupational History   Not on file  Tobacco Use   Smoking status: Former    Packs/day: 1.00    Years: 38.00    Additional pack years: 0.00    Total pack years: 38.00    Types: Cigarettes     Start date: 10/30/2012    Quit date: 10/31/2012    Years since quitting: 10.0   Smokeless tobacco: Never  Vaping Use   Vaping Use: Never used  Substance and Sexual Activity   Alcohol use: No    Alcohol/week: 0.0 standard drinks of alcohol   Drug use: No   Sexual activity: Not on file  Other Topics Concern   Not on file  Social History Narrative   Not on file   Social Determinants of Health   Financial Resource Strain: Medium Risk (02/03/2022)   Overall Financial Resource Strain (CARDIA)    Difficulty of Paying Living Expenses: Somewhat hard  Food Insecurity: Not on file  Transportation Needs: No Transportation Needs (02/03/2022)   PRAPARE - Transportation    Lack of Transportation (Medical): No    Lack of Transportation (Non-Medical): No  Physical Activity: Not on file  Stress: Not on file  Social Connections: Not on file  Intimate Partner Violence: Not on file    Physical Exam: Vital signs in last 24 hours: @BP  115/65   Pulse 75   Temp (!) 96.8 F (36 C) (Temporal)   Ht 5\' 1"  (1.549 m)   Wt 115 lb (52.2 kg)   SpO2 100%   BMI 21.73 kg/m  GEN: NAD EYE: Sclerae anicteric ENT: MMM CV: Non-tachycardic Pulm: CTA b/l GI: Soft, NT/ND NEURO:  Alert & Oriented x 3   Erick Blinks, MD [facility]  11/06/2022 10:18 AM

## 2022-11-06 NOTE — Patient Instructions (Addendum)
Clear liquids until 11:45, soft foods for the rest of today. Tomorrow resume normal diet.   YOU HAD AN ENDOSCOPIC PROCEDURE TODAY AT THE [facility]:   Refer to the procedure report that was given to you for any specific questions about what was found during the examination.  If the procedure report does not answer your questions, please call your gastroenterologist to clarify.  If you requested that your care partner not be given the details of your procedure findings, then the procedure report has been included in a sealed envelope for you to review at your convenience later.  YOU SHOULD EXPECT: Some feelings of bloating in the abdomen. Passage of more gas than usual.  Walking can help get rid of the air that was put into your GI tract during the procedure and reduce the bloating. If you had a lower endoscopy (such as a colonoscopy or flexible sigmoidoscopy) you may notice spotting of blood in your stool or on the toilet paper. If you underwent a bowel prep for your procedure, you may not have a normal bowel movement for a few days.  Please Note:  You might notice some irritation and congestion in your nose or some drainage.  This is from the oxygen used during your procedure.  There is no need for concern and it should clear up in a day or so.  SYMPTOMS TO REPORT IMMEDIATELY:   Following upper endoscopy (EGD)  Vomiting of blood or coffee ground material  New chest pain or pain under the shoulder blades  Painful or persistently difficult swallowing  New shortness of breath  Fever of 100F or higher  Black, tarry-looking stools  For urgent or emergent issues, a gastroenterologist can be reached at any hour by calling [phone]. Do not use MyChart messaging for urgent concerns.    DIET:  Clear liquids until 11:45, soft foods for the rest of today. Tomorrow you may proceed to your regular diet.  Drink plenty of fluids but you should avoid alcoholic beverages for 24  hours.  ACTIVITY:  You should plan to take it easy for the rest of today and you should NOT DRIVE or use heavy machinery until tomorrow (because of the sedation medicines used during the test).    FOLLOW UP: Our staff will call the number listed on your records the next business day following your procedure.  We will call around 7:15- 8:00 am to check on you and address any questions or concerns that you may have regarding the information given to you following your procedure. If we do not reach you, we will leave a message.     If any biopsies were taken you will be contacted by phone or by letter within the next 1-3 weeks.  Please call us at [phone] if you have not heard about the biopsies in 3 weeks.    SIGNATURES/CONFIDENTIALITY: You and/or your care partner have signed paperwork which will be entered into your electronic medical record.  These signatures attest to the fact that that the information above on your After Visit Summary has been reviewed and is understood.  Full responsibility of the confidentiality of this discharge information lies with you and/or your care-partner.

## 2022-11-07 ENCOUNTER — Telehealth: Payer: Self-pay

## 2022-11-07 NOTE — Telephone Encounter (Signed)
Follow up call placed, no answer. 

## 2022-11-10 DIAGNOSIS — Z923 Personal history of irradiation: Secondary | ICD-10-CM | POA: Insufficient documentation

## 2022-11-27 ENCOUNTER — Ambulatory Visit: Payer: Medicaid Other | Attending: Radiation Oncology

## 2022-11-27 DIAGNOSIS — R1310 Dysphagia, unspecified: Secondary | ICD-10-CM

## 2022-11-27 NOTE — Patient Instructions (Signed)
   If you should have difficulty with food passing through the throat (not from the esophageal stricture), or start coughing or choking more often, contact Dr. Basilio Cairo, or your ENT or GI MD. They may want to do a "modified barium swallow evaluation".   Instead of the Shaker exercise, do this  Chin pushback - Open your mouth  - Place your fist UNDER your chin near your neck - Tuck your chin and push back with your fist for 5 seconds - Repeat 10 times, 2 times a day, or 20 times a day (never less than 5 reps at once)  If you plan to start doing all the exercises as often as I prescribed them, please do them 20 repetitions each, or twice a day (10x each) until 03/01/23, then do them three times a week (once a day) after that.
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
## 2022-12-30 ENCOUNTER — Other Ambulatory Visit: Payer: Self-pay | Admitting: Family Medicine

## 2023-01-01 NOTE — Telephone Encounter (Signed)
Requested medication (s) are due for refill today: yes  Requested medication (s) are on the active medication list: yes  Last refill:  06/29/22 #30/2  Future visit scheduled: no  Notes to clinic:  Unable to refill per protocol, cannot delegate.      Requested Prescriptions  Pending Prescriptions Disp Refills   cyclobenzaprine (FLEXERIL) 5 MG tablet [Pharmacy Med Name: CYCLOBENZAPRINE 5 MG TABLET] 30 tablet 2    Sig: TAKE 1 TABLET BY MOUTH THREE TIMES A DAY AS NEEDED FOR MUSCLE SPASMS     Not Delegated - Analgesics:  Muscle Relaxants Failed - 12/30/2022  8:43 AM      Failed - This refill cannot be delegated      Failed - Valid encounter within last 6 months    Recent Outpatient Visits           1 year ago New onset seizure (HCC)   [facility] Pickard, Priscille Heidelberg, MD   1 year ago Prostate cancer screening   [facility] Donita Brooks, MD   2 years ago COPD exacerbation [facility])   [facility] Valentino Nose, NP   2 years ago Malignant neoplasm of lower third of esophagus (HCC)   [facility] Donita Brooks, MD   5 years ago Hypotension due to drugs   [facility] Pickard, Priscille Heidelberg, MD

## 2023-03-30 ENCOUNTER — Other Ambulatory Visit: Payer: Self-pay | Admitting: Family Medicine

## 2023-04-03 NOTE — Telephone Encounter (Signed)
Requested medication (s) are due for refill today: Yes  Requested medication (s) are on the active medication list: Yes  Last refill:  01/01/23  Future visit scheduled: No  Notes to clinic:  Not delegated.    Requested Prescriptions  Pending Prescriptions Disp Refills   cyclobenzaprine (FLEXERIL) 5 MG tablet [Pharmacy Med Name: CYCLOBENZAPRINE 5 MG TABLET] 30 tablet 2    Sig: TAKE 1 TABLET BY MOUTH THREE TIMES A DAY AS NEEDED FOR MUSCLE SPASM     Not Delegated - Analgesics:  Muscle Relaxants Failed - 03/30/2023  8:21 PM      Failed - This refill cannot be delegated      Failed - Valid encounter within last 6 months    Recent Outpatient Visits           1 year ago New onset seizure (HCC)   [facility] Pickard, Priscille Heidelberg, MD   2 years ago Prostate cancer screening   [facility] Donita Brooks, MD   2 years ago COPD exacerbation [facility])   [facility] Valentino Nose, NP   2 years ago Malignant neoplasm of lower third of esophagus (HCC)   [facility] Donita Brooks, MD   5 years ago Hypotension due to drugs   [facility] Pickard, Priscille Heidelberg, MD

## 2023-04-12 ENCOUNTER — Ambulatory Visit: Payer: Medicaid Other | Admitting: Family Medicine

## 2023-04-12 VITALS — BP 116/72 | HR 83 | Temp 98.3°F | Ht 61.0 in | Wt 116.8 lb

## 2023-04-12 DIAGNOSIS — Z23 Encounter for immunization: Secondary | ICD-10-CM

## 2023-04-12 DIAGNOSIS — G40909 Epilepsy, unspecified, not intractable, without status epilepticus: Secondary | ICD-10-CM

## 2023-04-12 NOTE — Progress Notes (Signed)
Subjective:    Patient ID: Darrell Cunningham, male    DOB: 1960/11/01, 62 y.o.   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  He is also treated for epilepsy with Keppra.  He has been taking 500 mg in the morning and 750 mg at night.  He states that he has occasional seizures very infrequently.  He has warning in the form of an aura.  He will have an unusual taste or an unusual smell and then the seizure will occur.  I cautioned the patient that he should not be driving if he has seizures.  He is also not been taking the Keppra as it was prescribed by neurology.  They have recommended 750 mg twice daily.  He was unaware of this.  He denies any pain with swallowing however he is occasionally having food gets stuck in his throat.  He denies any hematemesis.  He denies any hematochezia.  He denies any melena.  He denies any fevers or chills. 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 Borchardt underwent genetic counseling for hereditary cancer syndromes on 10/31/2016. Though he is a candidate for genetic testing, he declines at this time.   GERD (gastroesophageal reflux disease)    History of chemotherapy    03-2017   History of radiation therapy    03-2017   Hypertension    Malignant neoplasm of base of tongue (HCC)    Neuromuscular disorder (HCC)    [name]   Pneumonia 4 yrs ago   Seizure [facility])    Past Surgical History:  Procedure Laterality Date   COLONOSCOPY     COMPLETE ESOPHAGECTOMY N/A 04/16/2017   Procedure: ESOPHAGECTOMY COMPLETE,Transhiatal  total esophagectomy;  Surgeon: Delight Ovens, MD;  Location: [facility] OR;  Service: Thoracic;  Laterality: N/A;   DIRECT LARYNGOSCOPY N/A 01/27/2022   Procedure: DIRECT LARYNGOSCOPY WITH BIOPSIES; FROZEN SECTION;  Surgeon: Serena Colonel, MD;  Location: [facility] OR;  Service: ENT;  Laterality: N/A;   ESOPHAGOGASTRODUODENOSCOPY (EGD) WITH PROPOFOL N/A 03/30/2019   Procedure: ESOPHAGOGASTRODUODENOSCOPY (EGD) WITH PROPOFOL;  Surgeon: Meryl Dare, MD;  Location: [facility] ENDOSCOPY;  Service: Endoscopy;  Laterality: N/A;   EUS N/A 10/26/2016   Procedure: UPPER ENDOSCOPIC ULTRASOUND (EUS) LINEAR;  Surgeon: Rachael Fee, MD;  Location: [facility] ENDOSCOPY;  Service: Endoscopy;  Laterality: N/A;   EYE SURGERY Bilateral age 37    for cross eyes   FOREIGN BODY REMOVAL  03/30/2019   Procedure: FOREIGN BODY REMOVAL;  Surgeon: Meryl Dare, MD;  Location: [facility] ENDOSCOPY;  Service: Endoscopy;;   IR FLUORO GUIDE PORT INSERTION RIGHT  11/08/2016   right upper chest   IR IMAGING GUIDED PORT INSERTION  02/24/2022   IR REMOVAL TUN ACCESS W/ PORT W/O FL MOD SED  04/12/2020   IR REMOVAL TUN ACCESS W/ PORT W/O FL MOD SED  08/09/2022   IR US GUIDE VASC ACCESS RIGHT  11/08/2016   JEJUNOSTOMY N/A  04/16/2017   Procedure: Daria Pastures;  Surgeon: Delight Ovens, MD;  Location: [facility] OR;  Service: Thoracic;  Laterality: N/A;   JEJUNOSTOMY     removal of feeding tube /pt unsure of date   MOUTH SURGERY     POLYPECTOMY     TONSILLECTOMY Bilateral 01/27/2022   Procedure: POSSIBLE TONSILLECTOMY;  Surgeon: Serena Colonel, MD;  Location: [facility] OR;  Service: ENT;  Laterality: Bilateral;   UPPER GASTROINTESTINAL ENDOSCOPY     UPPER GASTROINTESTINAL ENDOSCOPY  06/03/2021   hx of esoph ca   VIDEO BRONCHOSCOPY N/A 04/16/2017   Procedure: VIDEO BRONCHOSCOPY, Transhiatal Total Esophagectomy, Esophagogastrostomy, pyloromotomy, Feeding Jejunostomy;  Surgeon: Delight Ovens, MD;  Location: [facility] OR;  Service: Thoracic;  Laterality: N/A;    Current Outpatient Medications on File Prior to Visit  Medication Sig Dispense Refill   acetaminophen (TYLENOL) 500 MG tablet Take 500 mg by mouth every 6 (six) hours as needed for moderate pain.     albuterol (VENTOLIN HFA) 108 (90 Base) MCG/ACT inhaler Inhale 2 puffs into the lungs every 6 (six) hours as needed for wheezing or shortness of breath. 8 g 3   atorvastatin (LIPITOR) 20 MG tablet TAKE 1 TABLET BY MOUTH EVERY DAY 90 tablet 3   Budeson-Glycopyrrol-Formoterol (BREZTRI AEROSPHERE) 160-9-4.8 MCG/ACT AERO Inhale 1 puff into the lungs daily as needed.     clotrimazole-betamethasone (LOTRISONE) cream Apply 1 Application topically 2 (two) times daily. 30 g 0   cyclobenzaprine (FLEXERIL) 5 MG tablet TAKE 1 TABLET BY MOUTH THREE TIMES A DAY AS NEEDED FOR MUSCLE SPASM 30 tablet 2   fluticasone (FLONASE) 50 MCG/ACT nasal spray SPRAY 1 SPRAY INTO EACH NOSTRIL EVERY DAY AS NEEDED FOR ALLERGIES/RHINITIS 16 mL 2   levETIRAcetam (KEPPRA) 500 MG tablet Take 1 tablet (500 mg total) by mouth 2 (two) times daily. 60 tablet 11   pantoprazole (PROTONIX) 40 MG tablet TAKE 1 TABLET BY MOUTH TWICE A DAY 180 tablet 3   rOPINIRole (REQUIP) 1 MG tablet Take 1 mg by mouth at bedtime. (Patient not taking: Reported on 04/12/2023)     [DISCONTINUED] prochlorperazine (COMPAZINE) 10 MG tablet Take 1 tablet (10 mg total) by mouth every 6 (six) hours as needed (Nausea or vomiting). 30 tablet 1   Current Facility-Administered Medications on File Prior to Visit  Medication Dose Route Frequency Provider Last Rate Last Admin   palonosetron (ALOXI) 0.25 MG/5ML injection            Allergies  Allergen Reactions   Penicillins Hives, Itching and Swelling    Has patient had a PCN reaction causing immediate rash, facial/tongue/throat swelling, SOB or lightheadedness with hypotension: No Has patient had a PCN reaction causing severe rash involving mucus membranes or skin necrosis: No Has patient had a PCN reaction that  required hospitalization No Has patient had a PCN reaction occurring within the last 10 years: No If all of the above answers are "NO", then may proceed with Cephalosporin use. Unknown Childhood reaction   Social History   Socioeconomic History   Marital status: Legally Separated    Spouse name: Not on file   Number of children: Not on file   Years of education: Not on file   Highest education level: Not on file  Occupational History   Not on file  Tobacco Use   Smoking status: Former    Current packs/day: 0.00    Average packs/day: 1 pack/day for 38.0 years (38.0 ttl pk-yrs)    Types: Cigarettes  Start date: 10/30/2012    Quit date: 10/31/2012    Years since quitting: 10.4   Smokeless tobacco: Never  Vaping Use   Vaping status: Never Used  Substance and Sexual Activity   Alcohol use: No    Alcohol/week: 0.0 standard drinks of alcohol   Drug use: No   Sexual activity: Not on file  Other Topics Concern   Not on file  Social History Narrative   Not on file   Social Determinants of Health   Financial Resource Strain: Medium Risk (02/03/2022)   Overall Financial Resource Strain (CARDIA)    Difficulty of Paying Living Expenses: Somewhat hard  Food Insecurity: Low Risk  (11/10/2022)   [facility]   Hunger Vital Sign    Worried About Running Out of Food in the Last Year: Never true    Ran Out of Food in the Last Year: Never true  Transportation Needs: Not on file (11/10/2022)  Physical Activity: Not on file  Stress: Not on file  Social Connections: Unknown (12/13/2021)   [facility]   Social Network    Social Network: Not on file  Intimate Partner Violence: Unknown (11/04/2021)   Received from [facility]   HITS    Physically Hurt: Not on file    Insult or Talk Down To: Not on file    Threaten Physical Harm: Not on file    Scream or Curse: Not on file     Review of Systems  All other systems  reviewed and are negative.      Objective:   Physical Exam Vitals reviewed.  Constitutional:      General: He is not in acute distress.    Appearance: Normal appearance. He is normal weight. He is not ill-appearing, toxic-appearing or diaphoretic.  HENT:     Head: Normocephalic.     Right Ear: Tympanic membrane, ear canal and external ear normal. There is no impacted cerumen.     Left Ear: Tympanic membrane, ear canal and external ear normal. There is no impacted cerumen.     Nose: Nose normal. No congestion or rhinorrhea.     Mouth/Throat:     Mouth: Mucous membranes are moist.  Eyes:     General: No scleral icterus.       Right eye: No discharge.        Left eye: No discharge.     Extraocular Movements: Extraocular movements intact.     Conjunctiva/sclera: Conjunctivae normal.     Pupils: Pupils are equal, round, and reactive to light.  Neck:     Vascular: No carotid bruit.  Cardiovascular:     Rate and Rhythm: Normal rate and regular rhythm.     Pulses: Normal pulses.     Heart sounds: Normal heart sounds. No murmur heard.    No friction rub. No gallop.  Pulmonary:     Effort: Pulmonary effort is normal. No respiratory distress.     Breath sounds: No stridor. Wheezing present. No rales.  Abdominal:     General: Bowel sounds are normal. There is no distension.     Palpations: Abdomen is soft. There is no mass.     Tenderness: There is no abdominal tenderness. There is no guarding or rebound.     Hernia: No hernia is present.  Musculoskeletal:     Cervical back: Neck supple. No rigidity or tenderness.     Right lower leg: No edema.  Left lower leg: No edema.  Lymphadenopathy:     Cervical: No cervical adenopathy.  Skin:    Coloration: Skin is not jaundiced or pale.     Findings: No bruising, erythema, lesion or rash.  Neurological:     General: No focal deficit present.     Mental Status: He is alert and oriented to person, place, and time. Mental status is at  baseline.     Cranial Nerves: No cranial nerve deficit.     Motor: No weakness.     Gait: Gait normal.     Deep Tendon Reflexes: Reflexes normal.  Psychiatric:        Mood and Affect: Mood normal.        Behavior: Behavior normal.        Thought Content: Thought content normal.        Judgment: Judgment normal.           Assessment & Plan:  Seizure disorder (HCC) - Plan: COMPLETE METABOLIC PANEL WITH GFR, CBC with Differential/Platelet  Needs flu shot - Plan: Flu vaccine trivalent PF, 6mos and older(Flulaval,Afluria,Fluarix,Fluzone)  Patient does report that he is occasionally having dysphagia.  I recommended that he contact his gastroenterologist as he may be due to have another EGD and dilatation performed.  Examination of his oropharynx and his neck reveals no lymphadenopathy and no palpable mass and no visual deformity.  I recommended that he increase his Keppra to 750 mg twice daily and recommended that he not drive until he is seizure-free for 6 months.  He has a neurology to let them know that the increased dose is helpful.  He received his flu shot today.  Check CBC and CMP

## 2023-04-13 LAB — COMPLETE METABOLIC PANEL WITH GFR
AG Ratio: 1.7 (calc) (ref 1.0–2.5)
ALT: 14 U/L (ref 9–46)
AST: 21 U/L (ref 10–35)
Albumin: 4.3 g/dL (ref 3.6–5.1)
Alkaline phosphatase (APISO): 90 U/L (ref 35–144)
BUN: 22 mg/dL (ref 7–25)
CO2: 27 mmol/L (ref 20–32)
Calcium: 9.1 mg/dL (ref 8.6–10.3)
Chloride: 102 mmol/L (ref 98–110)
Creat: 0.78 mg/dL (ref 0.70–1.35)
Globulin: 2.5 g/dL (ref 1.9–3.7)
Glucose, Bld: 87 mg/dL (ref 65–99)
Potassium: 4 mmol/L (ref 3.5–5.3)
Sodium: 138 mmol/L (ref 135–146)
Total Bilirubin: 0.4 mg/dL (ref 0.2–1.2)
Total Protein: 6.8 g/dL (ref 6.1–8.1)
eGFR: 100 mL/min/{1.73_m2} (ref >=60)

## 2023-04-13 LAB — CBC WITH DIFFERENTIAL/PLATELET
Absolute Monocytes: 736 {cells}/uL (ref 200–950)
Basophils Absolute: 38 {cells}/uL (ref 0–200)
Basophils Relative: 0.6 %
Eosinophils Absolute: 358 cells/uL (ref 15–500)
Eosinophils Relative: 5.6 %
HCT: 40.4 % (ref 38.5–50.0)
Hemoglobin: 13.6 g/dL (ref 13.2–17.1)
Lymphs Abs: 1197 {cells}/uL (ref 850–3900)
MCH: 31.4 pg (ref 27.0–33.0)
MCHC: 33.7 g/dL (ref 32.0–36.0)
MCV: 93.3 fL (ref 80.0–100.0)
MPV: 9.8 fL (ref 7.5–12.5)
Monocytes Relative: 11.5 %
Neutro Abs: 4070 {cells}/uL (ref 1500–7800)
Neutrophils Relative %: 63.6 %
Platelets: 246 10*3/uL (ref 140–400)
RBC: 4.33 10*6/uL (ref 4.20–5.80)
RDW: 12.7 % (ref 11.0–15.0)
Total Lymphocyte: 18.7 %
WBC: 6.4 10*3/uL (ref 3.8–10.8)

## 2023-06-12 ENCOUNTER — Other Ambulatory Visit: Payer: Self-pay

## 2023-06-12 ENCOUNTER — Telehealth: Payer: Self-pay

## 2023-06-12 DIAGNOSIS — E7800 Pure hypercholesterolemia, unspecified: Secondary | ICD-10-CM

## 2023-06-12 MED ORDER — ATORVASTATIN CALCIUM 20 MG PO TABS
20.0000 mg | ORAL_TABLET | Freq: Every day | ORAL | 3 refills | Status: DC
Start: 2023-06-12 — End: 2024-05-12

## 2023-06-12 NOTE — Telephone Encounter (Signed)
Copied from CRM [id number]. Topic: Clinical - Medication Refill >> Jun 12, 2023 10:25 AM Almira Coaster wrote: Most Recent Primary Care Visit:  Provider: Lynnea Ferrier T  Department: [facility]  Visit Type: OFFICE VISIT  Date: 04/12/2023  Medication: atorvastatin (LIPITOR) 20 MG tablet  Has the patient contacted their pharmacy? Yes, pharmacy states they sent over a refill request.  (Agent: If no, request that the patient contact the pharmacy for the refill. If patient does not wish to contact the pharmacy document the reason why and proceed with request.) (Agent: If yes, when and what did the pharmacy advise?)  Is this the correct pharmacy for this prescription? Yes If no, delete pharmacy and type the correct one.  This is the patient's preferred pharmacy:  [id number] [place], [place] - [address] [place] [address] Phone: [phone] Fax: [phone]   Has the prescription been filled recently? No  Is the patient out of the medication? No, has one pill left.   Has the patient been seen for an appointment in the last year OR does the patient have an upcoming appointment? Yes  Can we respond through MyChart? Yes, but prefers a phone call.   Agent: Please be advised that Rx refills may take up to 3 business days. We ask that you follow-up with your pharmacy.

## 2023-06-18 ENCOUNTER — Telehealth: Payer: Self-pay | Admitting: *Deleted

## 2023-06-18 NOTE — Telephone Encounter (Signed)
Called patient to inform of CT for 06-29-23- arrival time- 12:15 pm @ [facility], no restrictions to scan, patient to receive results from Dr. Basilio Cairo on 07-03-23 @ 2 pm, spoke with patient and he is aware of these appts. and the instructions

## 2023-06-29 ENCOUNTER — Ambulatory Visit (HOSPITAL_COMMUNITY)
Admission: RE | Admit: 2023-06-29 | Discharge: 2023-06-29 | Disposition: A | Discharge status: Home / Self Care | Payer: Medicaid Other | Source: Ambulatory Visit | Attending: Radiation Oncology | Admitting: Radiation Oncology

## 2023-06-29 DIAGNOSIS — C01 Malignant neoplasm of base of tongue: Secondary | ICD-10-CM | POA: Insufficient documentation

## 2023-06-29 DIAGNOSIS — C770 Secondary and unspecified malignant neoplasm of lymph nodes of head, face and neck: Secondary | ICD-10-CM | POA: Insufficient documentation

## 2023-06-29 MED ORDER — IOHEXOL 300 MG/ML  SOLN
75.0000 mL | Freq: Once | INTRAMUSCULAR | Status: AC | PRN
  Administered 2023-06-29: 75 mL via INTRAVENOUS

## 2023-07-03 ENCOUNTER — Other Ambulatory Visit: Payer: Self-pay | Admitting: Family Medicine

## 2023-07-03 ENCOUNTER — Encounter: Payer: Self-pay | Admitting: Radiation Oncology

## 2023-07-03 ENCOUNTER — Ambulatory Visit
Admission: RE | Admit: 2023-07-03 | Discharge: 2023-07-03 | Disposition: A | Discharge status: Home / Self Care | Payer: Medicaid Other | Source: Ambulatory Visit | Attending: Radiation Oncology | Admitting: Radiation Oncology

## 2023-07-03 VITALS — BP 123/80 | HR 87 | Temp 97.1°F | Resp 18 | Ht 61.0 in | Wt 118.8 lb

## 2023-07-03 DIAGNOSIS — Z79899 Other long term (current) drug therapy: Secondary | ICD-10-CM | POA: Insufficient documentation

## 2023-07-03 DIAGNOSIS — R599 Enlarged lymph nodes, unspecified: Secondary | ICD-10-CM | POA: Insufficient documentation

## 2023-07-03 DIAGNOSIS — Z923 Personal history of irradiation: Secondary | ICD-10-CM | POA: Diagnosis not present

## 2023-07-03 DIAGNOSIS — K449 Diaphragmatic hernia without obstruction or gangrene: Secondary | ICD-10-CM | POA: Diagnosis not present

## 2023-07-03 DIAGNOSIS — I2510 Atherosclerotic heart disease of native coronary artery without angina pectoris: Secondary | ICD-10-CM | POA: Diagnosis not present

## 2023-07-03 DIAGNOSIS — Z8501 Personal history of malignant neoplasm of esophagus: Secondary | ICD-10-CM | POA: Diagnosis not present

## 2023-07-03 DIAGNOSIS — R911 Solitary pulmonary nodule: Secondary | ICD-10-CM | POA: Insufficient documentation

## 2023-07-03 DIAGNOSIS — I3139 Other pericardial effusion (noninflammatory): Secondary | ICD-10-CM | POA: Insufficient documentation

## 2023-07-03 DIAGNOSIS — I700 Atherosclerosis of aorta: Secondary | ICD-10-CM | POA: Insufficient documentation

## 2023-07-03 DIAGNOSIS — Z85810 Personal history of malignant neoplasm of tongue: Secondary | ICD-10-CM | POA: Diagnosis present

## 2023-07-03 DIAGNOSIS — C01 Malignant neoplasm of base of tongue: Secondary | ICD-10-CM

## 2023-07-03 DIAGNOSIS — C7989 Secondary malignant neoplasm of other specified sites: Secondary | ICD-10-CM

## 2023-07-03 NOTE — Telephone Encounter (Signed)
Requested medication (s) are due for refill today - yes  Requested medication (s) are on the active medication list -yes  Future visit scheduled -no  Last refill: 04/06/23 #30 2RF  Notes to clinic: non delegated Rx  Requested Prescriptions  Pending Prescriptions Disp Refills   cyclobenzaprine (FLEXERIL) 5 MG tablet 30 tablet 2     Not Delegated - Analgesics:  Muscle Relaxants Failed - 07/03/2023 12:28 PM      Failed - This refill cannot be delegated      Failed - Valid encounter within last 6 months    Recent Outpatient Visits           2 years ago New onset seizure (HCC)   [facility] Pickard, Priscille Heidelberg, MD   2 years ago Prostate cancer screening   [facility] Donita Brooks, MD   2 years ago COPD exacerbation (HCC)   [facility] Valentino Nose, NP   3 years ago Malignant neoplasm of lower third of esophagus (HCC)   [facility] Donita Brooks, MD   5 years ago Hypotension due to drugs   [facility] Pickard, Priscille Heidelberg, MD                 Requested Prescriptions  Pending Prescriptions Disp Refills   cyclobenzaprine (FLEXERIL) 5 MG tablet 30 tablet 2     Not Delegated - Analgesics:  Muscle Relaxants Failed - 07/03/2023 12:28 PM      Failed - This refill cannot be delegated      Failed - Valid encounter within last 6 months    Recent Outpatient Visits           2 years ago New onset seizure (HCC)   [facility] Pickard, Priscille Heidelberg, MD   2 years ago Prostate cancer screening   [facility] Donita Brooks, MD   2 years ago COPD exacerbation [facility])   [facility] Valentino Nose, NP   3 years ago Malignant neoplasm of lower third of esophagus (HCC)   [facility] Donita Brooks, MD   5 years ago Hypotension due to drugs   [facility] Pickard, Priscille Heidelberg, MD

## 2023-07-03 NOTE — Telephone Encounter (Signed)
Prescription Request  07/03/2023  LOV: 04/12/2023  What is the name of the medication or equipment? cyclobenzaprine (FLEXERIL) 5 MG tablet   Have you contacted your pharmacy to request a refill? Yes   Which pharmacy would you like this sent to?  [id number] [place], [place] - [address] [place] [address] Phone: [phone] Fax: [phone]    Patient notified that their request is being sent to the clinical staff for review and that they should receive a response within 2 business days.   Please advise at [facility] [phone]

## 2023-07-03 NOTE — Progress Notes (Addendum)
Radiation Oncology         [phone] ________________________________  Name: CLYNTON SHANHOLTZ MRN: [id number]  Date: 07/03/2023  DOB: 1961/06/27  Follow-Up Visit Note  CC: Donita Brooks, MD  Malachy Mood, MD  Diagnosis and Prior Radiotherapy:  C80.1   ICD-10-CM   1. Malignant neoplasm of base of tongue (HCC)  C01 CT Chest W Contrast    CT Soft Tissue Neck W Contrast    2. 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)  C79.89    C80.1        Cancer Staging  Malignant neoplasm of base of tongue (HCC) Staging form: Pharynx - HPV-Mediated Oropharynx, AJCC 8th Edition - Clinical stage from 02/20/2022: Stage I (cT1, cN1, cM0, p16+) - Signed by Lonie Peak, MD on 02/20/2022 Stage prefix: Initial diagnosis  Malignant neoplasm of lower third of esophagus [facility]) Staging form: Esophagus - Adenocarcinoma, AJCC 8th Edition - Clinical stage from 10/26/2016: Stage III (cT3, cN1, cM0) - Signed by Malachy Mood, MD on 11/05/2016 - Pathologic stage from 04/20/2017: Stage I (ypT0, pN0, cM0, GX) - Signed by Delight Ovens, MD on 04/23/2017 Stage prefix: Post-therapy Neoadjuvant therapy: Yes Response to neoadjuvant therapy: Complete response Total positive nodes: 0 Total nodes examined: 12 Histologic grading system: 3 grade system   Radiation Treatment Dates: 02/13/2022 through 03/31/2022 Site Technique Total Dose (Gy) Dose per Fx (Gy) Completed Fx Beam Energies  Oropharynx: [id number] IMRT 70/70 2 35/35 6X   Narrative: The patient tolerated radiation therapy relatively well.   CHIEF COMPLAINT:  Here for follow-up and surveillance of throat cancer  Narrative:  Mr. Lender is here for a follow-up appointment with Dr. Basilio Cairo following his treatment for   C01-Malignant neoplasm of base of tongue C15.5-Malignant neoplasm of lower third of esophagus.  Last treatment was 03-31-2022.  Most recent CT scan of the neck and chest showed no evidence of recurrent or progressive disease.    Radiation Treatment Dates: 02/13/2022 through 03/31/2022  Pain issues, if any: None Using a feeding tube?: No Weight changes, if any: None Swallowing issues, if any: Sometime  Smoking or chewing tobacco? no Using fluoride toothpaste daily? Denies have dentures Last ENT visit was on: November 5 ,2024 Other notable issues, if any: Continues to have issues with his memory. He first noticed this approximately 2 years ago. He sees a neurologist for this issue.     Vitals:    07/03/23 1406  BP: 123/80  Pulse: 87  Resp: 18  Temp: (!) 97.1 F (36.2 C)  SpO2: 100%  Weight: 53.9 kg  Height: 5\' 1"  (1.549 m)                     ALLERGIES:  is allergic to penicillins.  Meds: Current Outpatient Medications  Medication Sig Dispense Refill   acetaminophen (TYLENOL) 500 MG tablet Take 500 mg by mouth every 6 (six) hours as needed for moderate pain.     albuterol (VENTOLIN HFA) 108 (90 Base) MCG/ACT inhaler Inhale 2 puffs into the lungs every 6 (six) hours as needed for wheezing or shortness of breath. 8 g 3   atorvastatin (LIPITOR) 20 MG tablet Take 1 tablet (20 mg total) by mouth daily. 90 tablet 3   Budeson-Glycopyrrol-Formoterol (BREZTRI AEROSPHERE) 160-9-4.8 MCG/ACT AERO Inhale 1 puff into the lungs daily as needed.     clotrimazole-betamethasone (LOTRISONE) cream Apply 1 Application topically 2 (two) times daily. 30 g 0   cyclobenzaprine (FLEXERIL) 5 MG tablet TAKE  1 TABLET BY MOUTH THREE TIMES A DAY AS NEEDED FOR MUSCLE SPASM 30 tablet 2   fluticasone (FLONASE) 50 MCG/ACT nasal spray SPRAY 1 SPRAY INTO EACH NOSTRIL EVERY DAY AS NEEDED FOR ALLERGIES/RHINITIS 16 mL 2   levETIRAcetam (KEPPRA) 500 MG tablet Take 1 tablet (500 mg total) by mouth 2 (two) times daily. 60 tablet 11   pantoprazole (PROTONIX) 40 MG tablet TAKE 1 TABLET BY MOUTH TWICE A DAY 180 tablet 3   rOPINIRole (REQUIP) 1 MG tablet Take 1 mg by mouth at bedtime.     No current facility-administered medications for this  encounter.   Facility-Administered Medications Ordered in Other Encounters  Medication Dose Route Frequency Provider Last Rate Last Admin   palonosetron (ALOXI) 0.25 MG/5ML injection             Physical Findings: The patient is in no acute distress. Patient is alert and oriented. Wt Readings from Last 3 Encounters:  07/03/23 118 lb 12.8 oz (53.9 kg)  04/12/23 116 lb 12.8 oz (53 kg)  11/06/22 115 lb (52.2 kg)    height is 5\' 1"  (1.549 m) and weight is 118 lb 12.8 oz (53.9 kg). His temperature is 97.1 F (36.2 C) (abnormal). His blood pressure is 123/80 and his pulse is 87. His respiration is 18 and oxygen saturation is 100%. .  General: Alert and oriented, in no acute distress HEENT: Head is normocephalic. Extraocular movements are intact. Oropharynx is notable for no lesions or thrush Neck: Neck is notable for no masses to palpation Skin: Skin in treatment fields shows satisfactory healing.  He has a patchy erythematous rash on the forehead and throughout the scalp.  Lymphatics: see Neck Exam.  No palpable axillary adenopathy on the left Psychiatric: Judgment and insight are intact. Affect is appropriate.   Lab Findings: Lab Results  Component Value Date   WBC 6.4 04/12/2023   HGB 13.6 04/12/2023   HCT 40.4 04/12/2023   MCV 93.3 04/12/2023   PLT 246 04/12/2023    Lab Results  Component Value Date   TSH 1.578 01/06/2022    Radiographic Findings: CT Chest W Contrast  Result Date: 06/30/2023 CLINICAL DATA:  Restaging head and neck cancer. Remote history of esophageal neoplasm. * Tracking Code: BO * EXAM: CT CHEST WITH CONTRAST TECHNIQUE: Multidetector CT imaging of the chest was performed during intravenous contrast administration. RADIATION DOSE REDUCTION: This exam was performed according to the departmental dose-optimization program which includes automated exposure control, adjustment of the mA and/or kV according to patient size and/or use of iterative reconstruction  technique. CONTRAST:  75mL OMNIPAQUE IOHEXOL 300 MG/ML  SOLN COMPARISON:  PET-CT 06/26/2022 FINDINGS: Cardiovascular: The heart size appears within normal limits. Small pericardial effusion identified, image 92/2. Aortic atherosclerosis and coronary artery calcifications. Mediastinum/Nodes: Thyroid gland and trachea appear within normal limits. Postop change from esophagectomy with gastric pull-through. No enlarged mediastinal, supraclavicular, axillary or hilar lymph nodes. Lungs/Pleura: No pleural effusion, airspace consolidation, atelectasis, or pneumothorax. Mild fibrotic changes identified within the anterior right apex. Here, there is a nodule measuring 3 mm, image 30/3, which is stable compared with the previous exam. No new lung nodules. Upper Abdomen: No acute findings. There is a hiatal hernia which contains a nonobstructed loop of transverse colon as well as the body and tail of pancreas. The adrenal glands are preserved. No adenopathy within the imaged portions of the upper abdomen. Musculoskeletal: No chest wall abnormality. No acute or significant osseous findings. IMPRESSION: 1. No acute findings within the  chest. No specific findings identified to suggest recurrent tumor or metastatic disease within the chest. 2. Stable 3 mm right apical lung nodule. 3. Postop change from esophagectomy with gastric pull-through. 4. Coronary artery calcifications. 5.  Aortic Atherosclerosis (ICD10-I70.0). Electronically Signed   By: Signa Kell M.D.   On: 06/30/2023 12:55   CT Soft Tissue Neck W Contrast  Result Date: 06/30/2023 CLINICAL DATA:  Surveillance of head neck cancer at the base of tongue EXAM: CT NECK WITH CONTRAST TECHNIQUE: Multidetector CT imaging of the neck was performed using the standard protocol following the bolus administration of intravenous contrast. RADIATION DOSE REDUCTION: This exam was performed according to the departmental dose-optimization program which includes automated exposure  control, adjustment of the mA and/or kV according to patient size and/or use of iterative reconstruction technique. CONTRAST:  75mL OMNIPAQUE IOHEXOL 300 MG/ML  SOLN COMPARISON:  06/26/2022 PET-CT FINDINGS: Pharynx and larynx: Submucosal low-density thickening at the lower pharynx and supraglottic larynx consistent with treatment. Tiny bilateral laryngocele. No evidence of recurrent or metachronous mass. Salivary glands: High-density submandibular glands with volume loss attributed to treatment. Asymmetric stranding around the left submandibular gland and lateral neck. This is most likely from treatment given the palpation surveillance setting. Thyroid: Normal Lymph nodes: Left level 2 adenopathy has contracted to normal size with dystrophic calcifications, further regression from a 06/26/2022 PET-CT. No enlarged or newly heterogeneous lymph nodes. Vascular: Scattered atheromatous calcification. Limited intracranial: Negative Visualized orbits: Negative Mastoids and visualized paranasal sinuses: Clear Skeleton: Generalized endplate and facet spurring. Upper chest: No acute finding. Gastric pull-through, unremarkable where covered. IMPRESSION: Post treatment neck with no evidence of recurrent disease. Electronically Signed   By: Tiburcio Pea M.D.   On: 06/30/2023 11:08    Impression/Plan:    1) Head and Neck Cancer Status: Stable. Most recent CT scans show no evidence of disease recurrence. We personally reviewed these images and patient was very pleased with the results.   2) Nutritional Status: Overall he is doing quite well  Wt Readings from Last 3 Encounters:  07/03/23 118 lb 12.8 oz (53.9 kg)  04/12/23 116 lb 12.8 oz (53 kg)  11/06/22 115 lb (52.2 kg)   He has avoided any form of feeding tube.   3) Risk Factors: The patient has been educated about risk factors including alcohol and tobacco abuse; they understand that avoidance of alcohol and tobacco is important to prevent recurrences as well as  other cancers   4) Swallowing: Functional.  He will continue to follow with gastroenterology for dilatation as needed in the future.  Note history of esophagectomy for previous esophageal cancer   5)  Thyroid function: Will check annually Lab Results  Component Value Date   TSH 1.578 01/06/2022   6) Patient will contact his PCP for management of his rash; I do not think this is consistent with cancer recurrence.    7) Follow up CT scans in 12 months with rad-onc appointment to follow. Patient will follow-up with Dr. Al Pimple in 6 months. He is seeing Dr. Pollyann Kennedy in 2 months. Patient was encouraged to call with any questions or concerns in the meantime.     On date of service, in total, I spent 30 minutes on this encounter. Patient was seen in person.      Joyice Faster, PA-C

## 2023-07-03 NOTE — Addendum Note (Signed)
Encounter addended by: Erven Colla, PA-C on: 07/03/2023 4:40 PM  Actions taken: Clinical Note Signed

## 2023-07-03 NOTE — Progress Notes (Signed)
Darrell Cunningham is here today for a follow-up appointment. Radiation Treatment Dates: 02/13/2022 through 03/31/2022  Pain issues, if any: None Using a feeding tube?: No Weight changes, if any: None Swallowing issues, if any: Sometime  Smoking or chewing tobacco? no Using fluoride toothpaste daily? Denies have dentures Last ENT visit was on: November 5 ,2024 Other notable issues, if any:  Denies any other issues Vitals:   07/03/23 1406  BP: 123/80  Pulse: 87  Resp: 18  Temp: (!) 97.1 F (36.2 C)  SpO2: 100%  Weight: 53.9 kg  Height: 5\' 1"  (1.549 m)

## 2023-07-04 ENCOUNTER — Telehealth: Payer: Self-pay | Admitting: Hematology and Oncology

## 2023-07-04 ENCOUNTER — Telehealth: Payer: Self-pay

## 2023-07-04 NOTE — Telephone Encounter (Signed)
 Left patient a vm regarding upcoming appointment

## 2023-07-04 NOTE — Telephone Encounter (Signed)
Copied from CRM [id number]. Topic: Clinical - Medication Refill >> Jul 04, 2023  4:44 PM Cassiday T wrote: Most Recent Primary Care Visit:  Provider: Lynnea Ferrier T  Department: [facility]  Visit Type: OFFICE VISIT  Date: 04/12/2023  Medication: cyclobenzaprine (FLEXERIL) 5 MG tablet  Has the patient contacted their pharmacy? Yes (Agent: If no, request that the patient contact the pharmacy for the refill. If patient does not wish to contact the pharmacy document the reason why and proceed with request.) (Agent: If yes, when and what did the pharmacy advise?)  Is this the correct pharmacy for this prescription? Yes If no, delete pharmacy and type the correct one.  This is the patient's preferred pharmacy:  [id number] [place], [place] - [address] [place] [address] Phone: [phone] Fax: [phone]   Has the prescription been filled recently? No  Is the patient out of the medication? Yes  Has the patient been seen for an appointment in the last year OR does the patient have an upcoming appointment? Yes  Can we respond through MyChart? No  Agent: Please be advised that Rx refills may take up to 3 business days. We ask that you follow-up with your pharmacy.

## 2023-07-05 ENCOUNTER — Other Ambulatory Visit: Payer: Self-pay | Admitting: Radiology

## 2023-07-05 ENCOUNTER — Other Ambulatory Visit: Payer: Self-pay | Admitting: Family Medicine

## 2023-07-05 DIAGNOSIS — C7989 Secondary malignant neoplasm of other specified sites: Secondary | ICD-10-CM

## 2023-07-05 MED ORDER — CYCLOBENZAPRINE HCL 5 MG PO TABS: 5.0000 mg | ORAL_TABLET | Freq: Three times a day (TID) | ORAL | 2 refills | Status: AC | PRN

## 2023-07-09 ENCOUNTER — Ambulatory Visit
Admission: RE | Admit: 2023-07-09 | Discharge: 2023-07-09 | Disposition: A | Discharge status: Home / Self Care | Payer: Medicaid Other | Source: Ambulatory Visit | Attending: Hematology and Oncology | Admitting: Hematology and Oncology

## 2023-07-09 DIAGNOSIS — C7989 Secondary malignant neoplasm of other specified sites: Secondary | ICD-10-CM | POA: Diagnosis present

## 2023-07-09 DIAGNOSIS — C801 Malignant (primary) neoplasm, unspecified: Secondary | ICD-10-CM | POA: Insufficient documentation

## 2023-07-09 DIAGNOSIS — Z79899 Other long term (current) drug therapy: Secondary | ICD-10-CM | POA: Insufficient documentation

## 2023-07-09 LAB — TSH: TSH: 9.528 u[IU]/mL — ABNORMAL HIGH (ref 0.350–4.500)

## 2023-07-11 ENCOUNTER — Other Ambulatory Visit: Payer: Self-pay | Admitting: Radiology

## 2023-07-11 ENCOUNTER — Other Ambulatory Visit: Payer: Self-pay | Admitting: *Deleted

## 2023-07-11 DIAGNOSIS — C760 Malignant neoplasm of head, face and neck: Secondary | ICD-10-CM

## 2023-07-11 NOTE — Progress Notes (Signed)
Hi Shirley, are we able to get a reflex T4 on this pt?

## 2023-07-13 LAB — T4: T4, Total: 6.8 ug/dL (ref 4.5–12.0)

## 2023-09-28 ENCOUNTER — Telehealth: Payer: Self-pay

## 2023-09-28 NOTE — Telephone Encounter (Signed)
 Copied from CRM [id number]. Topic: General - Other >> Sep 28, 2023 10:50 AM Darrell Cunningham wrote: Reason for CRM: Patient called to see if he is due for any shots/labs/vaccines etc, if so, he wanted to schedule appointment on March 6th so he could have appointment on the same day as his daughter. I did let him know he was overdue for a physical, there wasn't any availability for next week so he wanted it to be scheduled in June with his daughter's appointment but I wasn't sure if he was due for any shots/labs/vaccines, etc.

## 2023-12-25 ENCOUNTER — Telehealth: Payer: Self-pay | Admitting: Radiation Oncology

## 2023-12-25 NOTE — Telephone Encounter (Signed)
 MRR from [place] [facility] [place] study given to dosimetry team 12/25/23 for DICOM records only.

## 2023-12-31 ENCOUNTER — Other Ambulatory Visit

## 2023-12-31 DIAGNOSIS — E7800 Pure hypercholesterolemia, unspecified: Secondary | ICD-10-CM

## 2023-12-31 DIAGNOSIS — D638 Anemia in other chronic diseases classified elsewhere: Secondary | ICD-10-CM

## 2023-12-31 DIAGNOSIS — Z125 Encounter for screening for malignant neoplasm of prostate: Secondary | ICD-10-CM

## 2023-12-31 DIAGNOSIS — G40909 Epilepsy, unspecified, not intractable, without status epilepticus: Secondary | ICD-10-CM

## 2023-12-31 DIAGNOSIS — J439 Emphysema, unspecified: Secondary | ICD-10-CM

## 2023-12-31 DIAGNOSIS — R634 Abnormal weight loss: Secondary | ICD-10-CM

## 2023-12-31 DIAGNOSIS — R7303 Prediabetes: Secondary | ICD-10-CM

## 2023-12-31 DIAGNOSIS — C01 Malignant neoplasm of base of tongue: Secondary | ICD-10-CM

## 2024-01-01 ENCOUNTER — Ambulatory Visit: Payer: Self-pay | Admitting: Family Medicine

## 2024-01-01 ENCOUNTER — Telehealth: Payer: Self-pay

## 2024-01-01 LAB — CBC WITH DIFFERENTIAL/PLATELET
Absolute Lymphocytes: 1270 {cells}/uL (ref 850–3900)
Absolute Monocytes: 676 {cells}/uL (ref 200–950)
Basophils Absolute: 41 {cells}/uL (ref 0–200)
Basophils Relative: 0.6 %
Eosinophils Absolute: 290 {cells}/uL (ref 15–500)
Eosinophils Relative: 4.2 %
HCT: 42.8 % (ref 38.5–50.0)
Hemoglobin: 14 g/dL (ref 13.2–17.1)
MCH: 31.7 pg (ref 27.0–33.0)
MCHC: 32.7 g/dL (ref 32.0–36.0)
MCV: 96.8 fL (ref 80.0–100.0)
MPV: 10.2 fL (ref 7.5–12.5)
Monocytes Relative: 9.8 %
Neutro Abs: 4623 {cells}/uL (ref 1500–7800)
Neutrophils Relative %: 67 %
Platelets: 221 10*3/uL (ref 140–400)
RBC: 4.42 10*6/uL (ref 4.20–5.80)
RDW: 13.5 % (ref 11.0–15.0)
Total Lymphocyte: 18.4 %
WBC: 6.9 10*3/uL (ref 3.8–10.8)

## 2024-01-01 LAB — COMPLETE METABOLIC PANEL WITHOUT GFR
AG Ratio: 1.9 (calc) (ref 1.0–2.5)
ALT: 12 U/L (ref 9–46)
AST: 17 U/L (ref 10–35)
Albumin: 4.3 g/dL (ref 3.6–5.1)
Alkaline phosphatase (APISO): 73 U/L (ref 35–144)
BUN: 17 mg/dL (ref 7–25)
CO2: 28 mmol/L (ref 20–32)
Calcium: 9.4 mg/dL (ref 8.6–10.3)
Chloride: 105 mmol/L (ref 98–110)
Creat: 0.88 mg/dL (ref 0.70–1.35)
Globulin: 2.3 g/dL (ref 1.9–3.7)
Glucose, Bld: 99 mg/dL (ref 65–99)
Potassium: 4.6 mmol/L (ref 3.5–5.3)
Sodium: 141 mmol/L (ref 135–146)
Total Bilirubin: 0.3 mg/dL (ref 0.2–1.2)
Total Protein: 6.6 g/dL (ref 6.1–8.1)

## 2024-01-01 LAB — LIPID PANEL
Cholesterol: 181 mg/dL (ref <200)
HDL: 56 mg/dL (ref >=40)
LDL Cholesterol (Calc): 110 mg/dL — ABNORMAL HIGH
Non-HDL Cholesterol (Calc): 125 mg/dL (ref <130)
Total CHOL/HDL Ratio: 3.2 (calc) (ref <5.0)
Triglycerides: 63 mg/dL (ref <150)

## 2024-01-01 LAB — TSH: TSH: 5.41 m[IU]/L — ABNORMAL HIGH (ref 0.40–4.50)

## 2024-01-01 LAB — PSA: PSA: 0.46 ng/mL (ref <=4.00)

## 2024-01-01 NOTE — Telephone Encounter (Signed)
 Verbally confirmed appt for 6/4

## 2024-01-02 ENCOUNTER — Inpatient Hospital Stay

## 2024-01-02 ENCOUNTER — Inpatient Hospital Stay: Payer: Medicaid Other | Attending: Hematology and Oncology | Admitting: Hematology and Oncology

## 2024-01-02 VITALS — BP 109/69 | HR 92 | Temp 98.5°F | Resp 16 | Wt 120.1 lb

## 2024-01-02 DIAGNOSIS — C01 Malignant neoplasm of base of tongue: Secondary | ICD-10-CM | POA: Insufficient documentation

## 2024-01-02 DIAGNOSIS — Z87891 Personal history of nicotine dependence: Secondary | ICD-10-CM | POA: Diagnosis not present

## 2024-01-02 DIAGNOSIS — C155 Malignant neoplasm of lower third of esophagus: Secondary | ICD-10-CM | POA: Insufficient documentation

## 2024-01-02 DIAGNOSIS — Z9221 Personal history of antineoplastic chemotherapy: Secondary | ICD-10-CM | POA: Insufficient documentation

## 2024-01-02 DIAGNOSIS — C779 Secondary and unspecified malignant neoplasm of lymph node, unspecified: Secondary | ICD-10-CM

## 2024-01-02 DIAGNOSIS — I700 Atherosclerosis of aorta: Secondary | ICD-10-CM | POA: Insufficient documentation

## 2024-01-02 DIAGNOSIS — J439 Emphysema, unspecified: Secondary | ICD-10-CM | POA: Diagnosis not present

## 2024-01-02 DIAGNOSIS — M542 Cervicalgia: Secondary | ICD-10-CM | POA: Diagnosis not present

## 2024-01-02 DIAGNOSIS — Z825 Family history of asthma and other chronic lower respiratory diseases: Secondary | ICD-10-CM | POA: Diagnosis not present

## 2024-01-02 DIAGNOSIS — Z9049 Acquired absence of other specified parts of digestive tract: Secondary | ICD-10-CM | POA: Diagnosis not present

## 2024-01-02 DIAGNOSIS — K222 Esophageal obstruction: Secondary | ICD-10-CM | POA: Diagnosis not present

## 2024-01-02 DIAGNOSIS — Z79899 Other long term (current) drug therapy: Secondary | ICD-10-CM | POA: Diagnosis not present

## 2024-01-02 DIAGNOSIS — Z803 Family history of malignant neoplasm of breast: Secondary | ICD-10-CM | POA: Insufficient documentation

## 2024-01-02 DIAGNOSIS — Z923 Personal history of irradiation: Secondary | ICD-10-CM | POA: Insufficient documentation

## 2024-01-02 DIAGNOSIS — R221 Localized swelling, mass and lump, neck: Secondary | ICD-10-CM | POA: Diagnosis not present

## 2024-01-02 DIAGNOSIS — Z5986 Financial insecurity: Secondary | ICD-10-CM | POA: Insufficient documentation

## 2024-01-02 DIAGNOSIS — G809 Cerebral palsy, unspecified: Secondary | ICD-10-CM | POA: Diagnosis not present

## 2024-01-02 DIAGNOSIS — K31819 Angiodysplasia of stomach and duodenum without bleeding: Secondary | ICD-10-CM | POA: Diagnosis not present

## 2024-01-02 DIAGNOSIS — I890 Lymphedema, not elsewhere classified: Secondary | ICD-10-CM | POA: Diagnosis not present

## 2024-01-02 DIAGNOSIS — Z9089 Acquired absence of other organs: Secondary | ICD-10-CM | POA: Insufficient documentation

## 2024-01-02 DIAGNOSIS — Z800 Family history of malignant neoplasm of digestive organs: Secondary | ICD-10-CM | POA: Diagnosis not present

## 2024-01-02 DIAGNOSIS — I2510 Atherosclerotic heart disease of native coronary artery without angina pectoris: Secondary | ICD-10-CM | POA: Diagnosis not present

## 2024-01-02 DIAGNOSIS — R569 Unspecified convulsions: Secondary | ICD-10-CM | POA: Insufficient documentation

## 2024-01-02 LAB — T4, FREE: Free T4: 0.73 ng/dL (ref 0.61–1.12)

## 2024-01-02 LAB — TSH: TSH: 4.49 u[IU]/mL (ref 0.350–4.500)

## 2024-01-02 NOTE — Assessment & Plan Note (Signed)
 Darrell Cunningham is a 63 year old male with recently diagnosed squamous cell carcinoma of the base of the tongue here today for evaluation prior to receiving concurrent chemoradiation.  He completed concurrent CRT, EOT PET CT with complete response. No concerns on exam today. He will continue with ENT surveillance. Tsh abnormal, will do free T4 Continue massages for lymphedema. He will RTC with us  in one yr.  Murleen Arms MD
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
## 2024-01-03 ENCOUNTER — Ambulatory Visit: Payer: Medicaid Other | Admitting: Family Medicine

## 2024-01-03 ENCOUNTER — Encounter: Payer: Self-pay | Admitting: Family Medicine

## 2024-01-03 VITALS — BP 120/62 | HR 94 | Temp 97.8°F | Ht 61.0 in | Wt 121.0 lb

## 2024-01-03 DIAGNOSIS — Z0001 Encounter for general adult medical examination with abnormal findings: Secondary | ICD-10-CM | POA: Diagnosis not present

## 2024-01-03 DIAGNOSIS — Z8501 Personal history of malignant neoplasm of esophagus: Secondary | ICD-10-CM

## 2024-01-03 DIAGNOSIS — Z8669 Personal history of other diseases of the nervous system and sense organs: Secondary | ICD-10-CM | POA: Diagnosis not present

## 2024-01-03 DIAGNOSIS — Z85810 Personal history of malignant neoplasm of tongue: Secondary | ICD-10-CM

## 2024-01-03 DIAGNOSIS — G802 Spastic hemiplegic cerebral palsy: Secondary | ICD-10-CM

## 2024-01-03 DIAGNOSIS — Z0000 Encounter for general adult medical examination without abnormal findings: Secondary | ICD-10-CM

## 2024-01-03 MED ORDER — GABAPENTIN 300 MG PO CAPS: 300.0000 mg | ORAL_CAPSULE | Freq: Every day | ORAL | 3 refills | Status: DC

## 2024-01-03 NOTE — Progress Notes (Signed)
 Subjective:    Patient ID: Darrell Cunningham, male    DOB: December 31, 1960, 63 y.o.   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  He is also treated for epilepsy with Keppra .  Patient is disabled due to history of cerebral palsy on the right side of his body.  Reviewing his immunizations, his immunizations are up-to-date Immunization History  Administered Date(s) Administered   Fluad Quad(high Dose 65+) 04/25/2021   Influenza, Seasonal, Injecte, Preservative Fre 04/12/2023   Influenza,inj,Quad PF,6+ Mos 06/29/2014, 06/22/2015, 06/02/2016, 04/25/2017, 09/18/2018, 05/13/2019, 05/07/2020   Moderna Sars-Covid-2 Vaccination 10/31/2019, 12/02/2019   Pneumococcal Polysaccharide-23 05/11/2020   Tdap 09/18/2018   Zoster Recombinant(Shingrix) 10/21/2019, 01/14/2021   Patient had a colonoscopy in 2021.  He is due again in 2028.  His PSA was recently checked on his lab work that was reassuring.  Recently was given a clear bill of health from his oncologist 1 to 1-year.  He has an appointment to see his ENT physician within the next month.  He also sees his neurologist within the next month.  He is currently on Keppra  500 mg in the morning and 750 mg in the evening for history of seizures.  However he occasionally has aura.  Aura is an unusual metallic taste in the mouth and a funny sensation on the right side of his body.  He then feels sleepy and tired shortly thereafter.  I question if he needs additional treatment to help prevent seizures.  He has an appointment next week to discuss this with his neurologist.  Of note he has neuropathic pain on the right side of his body that keeps him awake at night.  He reports burning and stinging pain down his right shoulder and into his right arm to his right hand.  This has been present ever since he underwent radiation treatment in his neck.  He also has numbness and tingling in his left arm.   He is no longer taking Requip . 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 Grime underwent genetic counseling for hereditary cancer syndromes on 10/31/2016. Though he is a candidate for genetic testing, he declines at this time.   GERD (gastroesophageal reflux disease)    History of chemotherapy    03-2017   History of radiation therapy    03-2017   Hypertension    Malignant neoplasm of base of tongue (HCC)    Neuromuscular disorder (HCC)    [name]   Pneumonia 4 yrs ago   Seizure [facility])    Past Surgical History:  Procedure Laterality Date   COLONOSCOPY     COMPLETE ESOPHAGECTOMY N/A 04/16/2017   Procedure: ESOPHAGECTOMY COMPLETE,Transhiatal total esophagectomy;  Surgeon: Norita Beauvais, MD;  Location: [facility] OR;  Service: Thoracic;  Laterality: N/A;   DIRECT LARYNGOSCOPY N/A 01/27/2022   Procedure: DIRECT LARYNGOSCOPY WITH BIOPSIES; FROZEN SECTION;  Surgeon: Janita Mellow, MD;  Location: [facility] OR;  Service: ENT;  Laterality: N/A;   ESOPHAGOGASTRODUODENOSCOPY (EGD) WITH PROPOFOL  N/A 03/30/2019   Procedure: ESOPHAGOGASTRODUODENOSCOPY (EGD) WITH PROPOFOL ;  Surgeon: Asencion Blacksmith, MD;  Location: [facility] ENDOSCOPY;  Service: Endoscopy;  Laterality: N/A;   EUS N/A 10/26/2016   Procedure: UPPER ENDOSCOPIC ULTRASOUND (EUS) LINEAR;  Surgeon: Janel Medford, MD;  Location: [facility] ENDOSCOPY;  Service: Endoscopy;  Laterality: N/A;   EYE SURGERY Bilateral age 83    for cross eyes   FOREIGN BODY REMOVAL  03/30/2019   Procedure: FOREIGN BODY REMOVAL;  Surgeon: Asencion Blacksmith, MD;  Location: [facility] ENDOSCOPY;  Service: Endoscopy;;   IR  FLUORO GUIDE PORT INSERTION RIGHT  11/08/2016   right upper chest   IR IMAGING GUIDED PORT INSERTION  02/24/2022   IR REMOVAL TUN ACCESS W/ PORT W/O FL MOD SED  04/12/2020   IR REMOVAL TUN ACCESS W/ PORT W/O FL MOD SED  08/09/2022   IR US  GUIDE VASC ACCESS RIGHT  11/08/2016   JEJUNOSTOMY N/A 04/16/2017   Procedure: Trixie Furnace;  Surgeon: Norita Beauvais, MD;  Location: [facility] OR;  Service: Thoracic;  Laterality: N/A;   JEJUNOSTOMY     removal of feeding tube /pt unsure of date   MOUTH SURGERY     POLYPECTOMY     TONSILLECTOMY Bilateral 01/27/2022   Procedure: POSSIBLE TONSILLECTOMY;  Surgeon: Janita Mellow, MD;  Location: [facility] OR;  Service: ENT;  Laterality: Bilateral;   UPPER GASTROINTESTINAL ENDOSCOPY     UPPER GASTROINTESTINAL ENDOSCOPY  06/03/2021   hx of esoph ca   VIDEO BRONCHOSCOPY N/A 04/16/2017   Procedure: VIDEO BRONCHOSCOPY, Transhiatal Total Esophagectomy, Esophagogastrostomy, pyloromotomy, Feeding Jejunostomy;  Surgeon: Norita Beauvais, MD;  Location: [facility] OR;  Service: Thoracic;  Laterality: N/A;   Current Outpatient Medications on File Prior to Visit  Medication Sig Dispense Refill   acetaminophen  (TYLENOL ) 500 MG tablet Take 500 mg by mouth every 6 (six) hours as needed for moderate pain.     albuterol  (VENTOLIN  HFA) 108 (90 Base) MCG/ACT inhaler Inhale 2 puffs into the lungs every 6 (six) hours as needed for wheezing or shortness of breath. 8 g 3   atorvastatin  (LIPITOR) 20 MG tablet Take 1 tablet (20 mg total) by mouth daily. 90 tablet 3   Budeson-Glycopyrrol-Formoterol (BREZTRI  AEROSPHERE) 160-9-4.8 MCG/ACT AERO Inhale 1 puff into the lungs daily as needed.     clotrimazole -betamethasone  (LOTRISONE ) cream Apply 1 Application topically 2 (two) times daily. 30 g 0   cyclobenzaprine  (FLEXERIL ) 5 MG tablet Take 1 tablet (5 mg total) by mouth 3 (three) times daily as needed for muscle spasms. 30 tablet 2   fluticasone  (FLONASE ) 50 MCG/ACT nasal spray SPRAY 1 SPRAY INTO  EACH NOSTRIL EVERY DAY AS NEEDED FOR ALLERGIES/RHINITIS 16 mL 2   levETIRAcetam  (KEPPRA ) 500 MG tablet Take 1 tablet (500 mg total) by mouth 2 (two) times daily. 60 tablet 11   pantoprazole  (PROTONIX ) 40 MG tablet TAKE 1 TABLET BY MOUTH TWICE A DAY 180 tablet 3   rOPINIRole  (REQUIP ) 1 MG tablet Take 1 mg by mouth at bedtime.     [DISCONTINUED] prochlorperazine  (COMPAZINE ) 10 MG tablet Take 1 tablet (10 mg total) by mouth every 6 (six) hours as needed (Nausea or vomiting). 30 tablet 1   Current Facility-Administered Medications on File Prior to Visit  Medication Dose Route Frequency Provider Last Rate Last Admin   palonosetron  (ALOXI ) 0.25 MG/5ML injection            Allergies  Allergen Reactions   Penicillins Hives, Itching and  Swelling    Has patient had a PCN reaction causing immediate rash, facial/tongue/throat swelling, SOB or lightheadedness with hypotension: No Has patient had a PCN reaction causing severe rash involving mucus membranes or skin necrosis: No Has patient had a PCN reaction that required hospitalization No Has patient had a PCN reaction occurring within the last 10 years: No If all of the above answers are "NO", then may proceed with Cephalosporin use. Unknown Childhood reaction   Social History   Socioeconomic History   Marital status: Legally Separated    Spouse name: Not on file   Number of children: Not on file   Years of education: Not on file   Highest education level: Not on file  Occupational History   Not on file  Tobacco Use   Smoking status: Former    Current packs/day: 0.00    Average packs/day: 1 pack/day for 38.0 years (38.0 ttl pk-yrs)    Types: Cigarettes    Start date: 10/30/2012    Quit date: 10/31/2012    Years since quitting: 11.1   Smokeless tobacco: Never  Vaping Use   Vaping status: Never Used  Substance and Sexual Activity   Alcohol use: No    Alcohol/week: 0.0 standard drinks of alcohol   Drug use: No   Sexual activity: Not on file   Other Topics Concern   Not on file  Social History Narrative   Not on file   Social Drivers of Health   Financial Resource Strain: Medium Risk (02/03/2022)   Overall Financial Resource Strain (CARDIA)    Difficulty of Paying Living Expenses: Somewhat hard  Food Insecurity: Low Risk  (11/10/2022)   [facility]   Hunger Vital Sign    Worried About Running Out of Food in the Last Year: Never true    Ran Out of Food in the Last Year: Never true  Transportation Needs: Not on file (11/10/2022)  Physical Activity: Not on file  Stress: Not on file  Social Connections: Unknown (12/13/2021)   [facility]   Social Network    Social Network: Not on file  Intimate Partner Violence: Unknown (11/04/2021)   [facility] from [facility], [facility]   HITS    Physically Hurt: Not on file    Insult or Talk Down To: Not on file    Threaten Physical Harm: Not on file    Scream or Curse: Not on file     Review of Systems  All other systems reviewed and are negative.      Objective:   Physical Exam Vitals reviewed.  Constitutional:      General: He is not in acute distress.    Appearance: Normal appearance. He is normal weight. He is not ill-appearing, toxic-appearing or diaphoretic.  HENT:     Head: Normocephalic.     Right Ear: Tympanic membrane, ear canal and external ear normal. There is no impacted cerumen.     Left Ear: Tympanic membrane, ear canal and external ear normal. There is no impacted cerumen.     Nose: Nose normal. No congestion or rhinorrhea.     Mouth/Throat:     Mouth: Mucous membranes are moist.  Eyes:     General: No scleral icterus.       Right eye: No discharge.        Left eye: No discharge.     Extraocular Movements: Extraocular movements intact.     Conjunctiva/sclera: Conjunctivae normal.  Pupils: Pupils are equal, round, and reactive to light.  Neck:     Vascular: No carotid bruit.   Cardiovascular:     Rate and Rhythm: Normal rate and regular rhythm.     Pulses: Normal pulses.     Heart sounds: Normal heart sounds. No murmur heard.    No friction rub. No gallop.  Pulmonary:     Effort: Pulmonary effort is normal. No respiratory distress.     Breath sounds: No stridor. Wheezing present. No rales.  Abdominal:     General: Bowel sounds are normal. There is no distension.     Palpations: Abdomen is soft. There is no mass.     Tenderness: There is no abdominal tenderness. There is no guarding or rebound.     Hernia: No hernia is present.  Musculoskeletal:     Cervical back: Neck supple. No rigidity or tenderness.     Right lower leg: No edema.     Left lower leg: No edema.  Lymphadenopathy:     Cervical: No cervical adenopathy.  Skin:    Coloration: Skin is not jaundiced or pale.     Findings: No bruising, erythema, lesion or rash.  Neurological:     General: No focal deficit present.     Mental Status: He is alert and oriented to person, place, and time. Mental status is at baseline.     Cranial Nerves: No cranial nerve deficit.     Motor: No weakness.     Gait: Gait normal.     Deep Tendon Reflexes: Reflexes normal.  Psychiatric:        Mood and Affect: Mood normal.        Behavior: Behavior normal.        Thought Content: Thought content normal.        Judgment: Judgment normal.           Assessment & Plan:  General medical exam  History of seizure disorder  Spastic hemiplegic cerebral palsy (HCC)  History of esophageal cancer  History of tongue cancer  Appointment on 01/02/2024  Component Date Value Ref Range Status   Free T4 01/02/2024 0.73  0.61 - 1.12 ng/dL Final   Comment: (NOTE) Biotin ingestion may interfere with free T4 tests. If the results are inconsistent with the TSH level, previous test results, or the clinical presentation, then consider biotin interference. If needed, order repeat testing after stopping biotin. Performed  at [facility], [address], [place], [place] [address]    TSH 01/02/2024 4.490  0.350 - 4.500 uIU/mL Final   Performed at [facility], [address] [place], [place] [address]  Lab on 12/31/2023  Component Date Value Ref Range Status   WBC 12/31/2023 6.9  3.8 - 10.8 Thousand/uL Final   RBC 12/31/2023 4.42  4.20 - 5.80 Million/uL Final   Hemoglobin 12/31/2023 14.0  13.2 - 17.1 g/dL Final   HCT 09/81/1914 42.8  38.5 - 50.0 % Final   MCV 12/31/2023 96.8  80.0 - 100.0 fL Final   MCH 12/31/2023 31.7  27.0 - 33.0 pg Final   MCHC 12/31/2023 32.7  32.0 - 36.0 g/dL Final   Comment: For adults, a slight decrease in the calculated MCHC value (in the range of 30 to 32 g/dL) is most likely not clinically significant; however, it should be interpreted with caution in correlation with other red cell parameters and the patient's clinical condition.    RDW 12/31/2023 13.5  11.0 - 15.0 %  Final   Platelets 12/31/2023 221  140 - 400 Thousand/uL Final   MPV 12/31/2023 10.2  7.5 - 12.5 fL Final   Neutro Abs 12/31/2023 4,623  1,500 - 7,800 cells/uL Final   Absolute Lymphocytes 12/31/2023 1,270  850 - 3,900 cells/uL Final   Absolute Monocytes 12/31/2023 676  200 - 950 cells/uL Final   Eosinophils Absolute 12/31/2023 290  15 - 500 cells/uL Final   Basophils Absolute 12/31/2023 41  0 - 200 cells/uL Final   Neutrophils Relative % 12/31/2023 67  % Final   Total Lymphocyte 12/31/2023 18.4  % Final   Monocytes Relative 12/31/2023 9.8  % Final   Eosinophils Relative 12/31/2023 4.2  % Final   Basophils Relative 12/31/2023 0.6  % Final   Glucose, Bld 12/31/2023 99  65 - 99 mg/dL Final   Comment: .            Fasting reference interval .    BUN 12/31/2023 17  7 - 25 mg/dL Final   Creat 16/05/9603 0.88  0.70 - 1.35 mg/dL Final   BUN/Creatinine Ratio 12/31/2023 SEE NOTE:  6 - 22 (calc) Final   Comment:    Not Reported: BUN and Creatinine are within    reference range. .     Sodium 12/31/2023 141  135 - 146 mmol/L Final   Potassium 12/31/2023 4.6  3.5 - 5.3 mmol/L Final   Chloride 12/31/2023 105  98 - 110 mmol/L Final   CO2 12/31/2023 28  20 - 32 mmol/L Final   Calcium  12/31/2023 9.4  8.6 - 10.3 mg/dL Final   Total Protein 54/03/8118 6.6  6.1 - 8.1 g/dL Final   Albumin  12/31/2023 4.3  3.6 - 5.1 g/dL Final   Globulin 14/78/2956 2.3  1.9 - 3.7 g/dL (calc) Final   AG Ratio 12/31/2023 1.9  1.0 - 2.5 (calc) Final   Total Bilirubin 12/31/2023 0.3  0.2 - 1.2 mg/dL Final   Alkaline phosphatase (APISO) 12/31/2023 73  35 - 144 U/L Final   AST 12/31/2023 17  10 - 35 U/L Final   ALT 12/31/2023 12  9 - 46 U/L Final   Cholesterol 12/31/2023 181  <200 mg/dL Final   HDL 21/30/8657 56  > OR = 40 mg/dL Final   Triglycerides 84/69/6295 63  <150 mg/dL Final   LDL Cholesterol (Calc) 12/31/2023 110 (H)  mg/dL (calc) Final   Comment: Reference range: <100 . Desirable range <100 mg/dL for primary prevention;   <70 mg/dL for patients with CHD or diabetic patients  with > or = 2 CHD risk factors. Aaron Aas LDL-C is now calculated using the Martin-Hopkins  calculation, which is a validated novel method providing  better accuracy than the Friedewald equation in the  estimation of LDL-C.  Melinda Sprawls et al. Erroll Heard. 2841;324(40): 2061-2068  (http://education.[employer].com/faq/FAQ164)    Total CHOL/HDL Ratio 12/31/2023 3.2  <1.0 (calc) Final   Non-HDL Cholesterol (Calc) 12/31/2023 125  <130 mg/dL (calc) Final   Comment: For patients with diabetes plus 1 major ASCVD risk  factor, treating to a non-HDL-C goal of <100 mg/dL  (LDL-C of <27 mg/dL) is considered a therapeutic  option.    TSH 12/31/2023 5.41 (H)  0.40 - 4.50 mIU/L Final   PSA 12/31/2023 0.46  < OR = 4.00 ng/mL Final   Comment: The total PSA value from this assay system is  standardized against the WHO standard. The test  result will be approximately 20% lower when compared  to the equimolar-standardized total PSA  (  Beckman  Coulter). Comparison of serial PSA results should be  interpreted with this fact in mind. . This test was performed using the Siemens  chemiluminescent method. Values obtained from  different assay methods cannot be used interchangeably. PSA levels, regardless of value, should not be interpreted as absolute evidence of the presence or absence of disease.    Labs look excellent.  From the standpoint of his physical exam, his immunizations are up-to-date.  Colonoscopy is up-to-date.  PSA is excellent.  I recommended that he discuss with his neurologist next week the episodes that he is having where he feels an aura followed by what sounds like a partial atypical seizure.  I will defer to neurology whether they feel like he should add additional antiepileptic.  We will discontinue Requip .  We will replace this with gabapentin 300 mg at night to help with neuropathic pain in the right arm believe is likely due to damage suffered from radiation therapy and chemotherapy.  Follow-up as planned with his ENT and oncologist.

## 2024-04-09 ENCOUNTER — Telehealth: Payer: Self-pay | Admitting: Family Medicine

## 2024-04-09 ENCOUNTER — Other Ambulatory Visit: Payer: Self-pay

## 2024-04-09 DIAGNOSIS — L304 Erythema intertrigo: Secondary | ICD-10-CM

## 2024-04-09 MED ORDER — CLOTRIMAZOLE-BETAMETHASONE 1-0.05 % EX CREA: 1.0000 | TOPICAL_CREAM | Freq: Two times a day (BID) | CUTANEOUS | 1 refills | Status: AC

## 2024-04-09 NOTE — Telephone Encounter (Signed)
 Prescription Request  04/09/2024  LOV: 01/03/2024  What is the name of the medication or equipment?   clotrimazole -betamethasone  (LOTRISONE ) cream   Have you contacted your pharmacy to request a refill? Yes   Which pharmacy would you like this sent to?  [id number] [address] [place], [place] - [address] [place] [address] Phone: [phone] Fax: [phone]    Patient notified that their request is being sent to the clinical staff for review and that they should receive a response within 2 business days.   Please advise pharmacist.

## 2024-04-17 ENCOUNTER — Encounter: Payer: Self-pay | Admitting: Hematology

## 2024-04-21 ENCOUNTER — Ambulatory Visit (INDEPENDENT_AMBULATORY_CARE_PROVIDER_SITE_OTHER): Admitting: Family Medicine

## 2024-04-21 DIAGNOSIS — Z23 Encounter for immunization: Secondary | ICD-10-CM | POA: Diagnosis not present

## 2024-04-21 NOTE — Progress Notes (Signed)
 Patient is in office today for a nurse visit for Immunization. Patient Injection was given in the  Left deltoid. Patient tolerated injection well.

## 2024-04-30 ENCOUNTER — Telehealth: Payer: Self-pay

## 2024-04-30 NOTE — Telephone Encounter (Signed)
 Incoming mail from [employer] that Lotrisone  cream is not covered by [employer]. Mail placed in green folder for  review. Thank you.

## 2024-05-01 ENCOUNTER — Other Ambulatory Visit: Payer: Self-pay | Admitting: Family Medicine

## 2024-05-01 MED ORDER — NYSTATIN-TRIAMCINOLONE 100000-0.1 UNIT/GM-% EX OINT: 1.0000 | TOPICAL_OINTMENT | Freq: Two times a day (BID) | CUTANEOUS | 0 refills | Status: AC

## 2024-05-10 ENCOUNTER — Other Ambulatory Visit: Payer: Self-pay | Admitting: Family Medicine

## 2024-05-10 DIAGNOSIS — E7800 Pure hypercholesterolemia, unspecified: Secondary | ICD-10-CM

## 2024-05-28 ENCOUNTER — Other Ambulatory Visit (HOSPITAL_COMMUNITY): Payer: Self-pay | Admitting: Otolaryngology

## 2024-05-28 DIAGNOSIS — Z8589 Personal history of malignant neoplasm of other organs and systems: Secondary | ICD-10-CM

## 2024-05-28 DIAGNOSIS — C01 Malignant neoplasm of base of tongue: Secondary | ICD-10-CM

## 2024-05-28 DIAGNOSIS — R1314 Dysphagia, pharyngoesophageal phase: Secondary | ICD-10-CM

## 2024-05-29 ENCOUNTER — Encounter: Payer: Self-pay | Admitting: Hematology

## 2024-06-09 ENCOUNTER — Ambulatory Visit (HOSPITAL_COMMUNITY)
Admission: RE | Admit: 2024-06-09 | Discharge: 2024-06-09 | Disposition: A | Discharge status: Home / Self Care | Source: Ambulatory Visit | Attending: Otolaryngology | Admitting: Otolaryngology

## 2024-06-09 ENCOUNTER — Other Ambulatory Visit (HOSPITAL_COMMUNITY): Payer: Self-pay | Admitting: Otolaryngology

## 2024-06-09 DIAGNOSIS — Z8589 Personal history of malignant neoplasm of other organs and systems: Secondary | ICD-10-CM

## 2024-06-09 DIAGNOSIS — R1314 Dysphagia, pharyngoesophageal phase: Secondary | ICD-10-CM | POA: Insufficient documentation

## 2024-06-09 DIAGNOSIS — C01 Malignant neoplasm of base of tongue: Secondary | ICD-10-CM

## 2024-06-17 ENCOUNTER — Telehealth: Payer: Self-pay | Admitting: *Deleted

## 2024-06-17 NOTE — Telephone Encounter (Signed)
 Called patient to inform of CT for 07-02-24- arrival time- 1:15 pm @ [facility], water only- 4 hrs. prior to scan, patient to receive CT results from Dr. Izell on 07-04-24 @ 2 pm, spoke with patient and he is aware of these appts. and the instructions

## 2024-06-20 ENCOUNTER — Encounter: Payer: Self-pay | Admitting: Hematology

## 2024-07-02 ENCOUNTER — Ambulatory Visit (HOSPITAL_COMMUNITY)
Admission: RE | Admit: 2024-07-02 | Discharge: 2024-07-02 | Disposition: A | Source: Ambulatory Visit | Attending: Radiology

## 2024-07-02 DIAGNOSIS — C01 Malignant neoplasm of base of tongue: Secondary | ICD-10-CM | POA: Insufficient documentation

## 2024-07-02 MED ORDER — IOHEXOL 300 MG/ML  SOLN
75.0000 mL | Freq: Once | INTRAMUSCULAR | Status: AC | PRN
  Administered 2024-07-02: 75 mL via INTRAVENOUS

## 2024-07-02 MED ORDER — SODIUM CHLORIDE (PF) 0.9 % IJ SOLN
INTRAMUSCULAR | Status: AC
  Filled 2024-07-02: qty 50

## 2024-07-02 NOTE — Progress Notes (Signed)
 Pain issues, if any: *** Using a feeding tube?: *** Weight changes, if any: *** Swallowing issues, if any: *** Smoking or chewing tobacco? *** Using fluoride toothpaste daily? *** Last ENT visit was on: *** Other notable issues, if any: ***

## 2024-07-04 ENCOUNTER — Ambulatory Visit
Admission: RE | Admit: 2024-07-04 | Discharge: 2024-07-04 | Disposition: A | Payer: Self-pay | Source: Ambulatory Visit | Attending: Radiation Oncology | Admitting: Radiation Oncology

## 2024-07-04 VITALS — BP 104/63 | HR 73 | Temp 97.6°F | Resp 18 | Ht 60.0 in | Wt 123.6 lb

## 2024-07-04 DIAGNOSIS — C01 Malignant neoplasm of base of tongue: Secondary | ICD-10-CM

## 2024-07-04 NOTE — Progress Notes (Addendum)
 Radiation Oncology         [phone] ________________________________  Name: Darrell Cunningham MRN: [id number]  Date: 07/04/2024  DOB: 07-16-1961  Follow-Up Visit Note  CC: Darrell Cunningham DASEN, MD  Darrell Callander, MD  Diagnosis:   ICD-10-CM   1. Malignant neoplasm of base of tongue (HCC)  C01        Cancer Staging  Malignant neoplasm of base of tongue (HCC) Staging form: Pharynx - HPV-Mediated Oropharynx, AJCC 8th Edition - Clinical stage from 02/20/2022: Stage I (cT1, cN1, cM0, p16+) - Signed by Darrell Domino, MD on 02/20/2022 Stage prefix: Initial diagnosis  Malignant neoplasm of lower third of esophagus [facility]) Staging form: Esophagus - Adenocarcinoma, AJCC 8th Edition - Clinical stage from 10/26/2016: Stage III (cT3, cN1, cM0) - Signed by Darrell Callander, MD on 11/05/2016 - Pathologic stage from 04/20/2017: Stage I (ypT0, ypN0, cM0, GX) - Signed by Darrell Dallas NOVAK, MD on 04/23/2017 Stage prefix: Post-therapy Neoadjuvant therapy: Yes Response to neoadjuvant therapy: Complete response Total positive nodes: 0 Total nodes examined: 12 Histologic grading system: 3 grade system   Most Recent Radiation Treatment Dates: 02/13/2022 through 03/31/2022 Site Technique Total Dose (Gy) Dose per Fx (Gy) Completed Fx Beam Energies  Oropharynx: [id number] IMRT 70/70 2 35/35 6X    Narrative: The patient tolerated radiation therapy relatively well.   CHIEF COMPLAINT:  Here for follow-up and surveillance of throat cancer  Narrative:  [name] is here for a follow-up appointment with Dr. Izell following his treatment for       He has intermittent violent coughing that can cause cramps. He had one episode of hemoptysis a couple of weeks ago during a coughing spell, with no recurrence.  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 He had an EEG at [facility]. He develops cramps when his head is flexed for prolonged periods that resolve when he straightens his head.  He  quit heavy smoking in 2014    Saw ENT Nannette) 05-27-24. Stable, no evidence of recurrent disease. Recheck 4 months or sooner as needed. Will schedule for barium swallow to evaluate the upper esophagus.  He has had some swallowing problems and is waiting to follow-up with Dr. Jesus to figure out what interventions are appropriate based on recent swallowing studies.DG ESOPHAGUS W SINGLE CM  showed Mild persistent focal ring-like narrowing of the gastroesophageal anastomosis without limitation of contrast passage and normal passage of 13 mm barium tablet, overall improved when compared to the study from 07/12/2017.                    ALLERGIES:  is allergic to penicillins.  Meds: Current Outpatient Medications  Medication Sig Dispense Refill   acetaminophen  (TYLENOL ) 500 MG tablet Take 500 mg by mouth every 6 (six) hours as needed for moderate pain.     albuterol  (VENTOLIN  HFA) 108 (90 Base) MCG/ACT inhaler Inhale 2 puffs into the lungs every 6 (six) hours as needed for wheezing or shortness of breath. 8 g 3   atorvastatin  (LIPITOR) 20 MG tablet TAKE 1 TABLET BY MOUTH EVERY DAY 90 tablet 3   Budeson-Glycopyrrol-Formoterol (BREZTRI  AEROSPHERE) 160-9-4.8 MCG/ACT AERO Inhale 1 puff into the lungs daily as needed.     clotrimazole -betamethasone  (LOTRISONE ) cream Apply 1 Application topically 2 (two) times daily. 45 g 1   cyclobenzaprine  (FLEXERIL ) 5 MG tablet Take 1 tablet (5 mg total) by mouth 3 (three) times daily as needed for muscle spasms. 30  tablet 2   fluticasone  (FLONASE ) 50 MCG/ACT nasal spray SPRAY 1 SPRAY INTO EACH NOSTRIL EVERY DAY AS NEEDED FOR ALLERGIES/RHINITIS 16 mL 2   gabapentin  (NEURONTIN ) 300 MG capsule Take 1 capsule (300 mg total) by mouth at bedtime. 30 capsule 3   levETIRAcetam  (KEPPRA ) 500 MG tablet Take 1 tablet (500 mg total) by mouth 2 (two) times daily. 60 tablet 11   nystatin -triamcinolone  ointment (MYCOLOG) Apply 1 Application topically 2 (two) times daily. 30 g 0    pantoprazole  (PROTONIX ) 40 MG tablet TAKE 1 TABLET BY MOUTH TWICE A DAY 180 tablet 3   No current facility-administered medications for this encounter.   Facility-Administered Medications Ordered in Other Encounters  Medication Dose Route Frequency Provider Last Rate Last Admin   palonosetron  (ALOXI ) 0.25 MG/5ML injection             Physical Findings: The patient is in no acute distress. Patient is alert and oriented. Wt Readings from Last 3 Encounters:  07/04/24 123 lb 9.6 oz (56.1 kg)  01/03/24 121 lb (54.9 kg)  01/02/24 120 lb 1.6 oz (54.5 kg)    height is 5' (1.524 m) and weight is 123 lb 9.6 oz (56.1 kg). His temperature is 97.6 F (36.4 C). His blood pressure is 104/63 and his pulse is 73. His respiration is 18 and oxygen  saturation is 99%. .  General: Alert and oriented, in no acute distress HEENT: Head is normocephalic. Extraocular movements are intact. Oropharynx is notable for no lesions or thrush   NECK: Neck supple, no tenderness. No cervical lymphadenopathy. CHEST: Lungs with wheezes, no other abnormalities. CARDIOVASCULAR: Heart regular rate and rhythm. ABDOMEN: Abdomen soft, non-tender. EXTREMITIES: No ankle edema. Skin: Skin in treatment fields shows satisfactory healing.   Lymphatics: see Neck Exam.    Psychiatric: Judgment and insight are intact. Affect is appropriate.   Lab Findings: Lab Results  Component Value Date   WBC 6.9 12/31/2023   HGB 14.0 12/31/2023   HCT 42.8 12/31/2023   MCV 96.8 12/31/2023   PLT 221 12/31/2023    Lab Results  Component Value Date   TSH 4.490 01/02/2024    Radiographic Findings: CT Soft Tissue Neck W Contrast Result Date: 07/03/2024 EXAM: CT NECK WITH CONTRAST 07/02/2024 02:00:18 PM TECHNIQUE: CT of the neck was performed with the administration of intravenous contrast. Multiplanar reformatted images are provided for review. Automated exposure control, iterative reconstruction, and/or weight based adjustment of the  mA/kV was utilized to reduce the radiation dose to as low as reasonably achievable. CONTRAST: 75 mL of Omnipaque  300. COMPARISON: CT Neck 06/29/2023. FDG PET/CT 06/26/2022. CLINICAL HISTORY: Head/neck cancer, monitor, malignant neoplasm of base of tongue. FINDINGS: AERODIGESTIVE TRACT: Partially visualized patulous esophagus and postsurgical changes of gastric pull-through, see separately dictated report for same day CT chest with IV contrast 07/02/2024 for further relevant evaluation. There are post treatment-related changes involving the upper aerodigestive tract and left neck soft tissues. No evidence of recurrent aerodigestive tract or neck mass. SALIVARY GLANDS: The parotid glands are unremarkable. There are post treatment-related changes involving the left submandibular gland most notably. THYROID : Unremarkable. LYMPH NODES: No pathological cervical lymphadenopathy. Treated left cervical lymph nodes. SOFT TISSUES: No mass or fluid collection. BRAIN, ORBITS, SINUSES AND MASTOIDS: There is mucosal thickening of the left maxillary and sphenoid sinuses with fluid level in the left maxillary sinus. LUNGS AND MEDIASTINUM: No acute abnormality. BONES: No focal bone abnormality. IMPRESSION: 1. Since 06/29/2023, no evidence of recurrent aerodigestive tract or neck mass. 2. No  pathological cervical lymphadenopathy. 3. Redemonstrated post-treatment-related changes to the upper aerodigestive tract, left cervical lymph node, and left neck soft tissues. 4. Fluid level and mucosal thickening in the left maxillary sinus, correlate for sinusitis. Electronically signed by: prentice spade 07/03/2024 10:51 AM EST RP Workstation: [id number]   CT Chest W Contrast Result Date: 07/03/2024 CLINICAL DATA:  Head and neck cancer, monitor. History of malignant neoplasm of the base of the tongue. * Tracking Code: BO * EXAM: CT CHEST WITH CONTRAST TECHNIQUE: Multidetector CT imaging of the chest was performed during intravenous contrast  administration. RADIATION DOSE REDUCTION: This exam was performed according to the departmental dose-optimization program which includes automated exposure control, adjustment of the mA and/or kV according to patient size and/or use of iterative reconstruction technique. CONTRAST:  75 cc Omnipaque  300 COMPARISON:  Chest CT 06/29/2023.  PET-CT 06/26/2022. FINDINGS: Neck findings dictated separately. Cardiovascular: No acute vascular findings. Atherosclerosis of the aorta, great vessels and coronary arteries again noted. Small amount of pericardial fluid or thickening has slightly improved from the previous study. The heart size is normal. Mediastinum/Nodes: There are no enlarged mediastinal, hilar or axillary lymph nodes.Small mediastinal lymph nodes are unchanged. Stable postsurgical changes from esophagectomy and gastric pull-through procedure. Lungs/Pleura: No pleural effusion or pneumothorax. There is mild diffuse central airway thickening. Mild subpleural scarring anteriorly at the right lung apex appears unchanged. Scattered micro nodules are stable, most notably in the left lower lobe (image 110/5) and right apex (image 36/5). No suspicious pulmonary nodules. Upper abdomen: Stable postsurgical changes from esophagectomy and gastric pull-through. Associated moderate to large hiatal hernia containing portions of the colon and pancreas. Musculoskeletal/Chest wall: There is no chest wall mass or suspicious osseous finding. Mild spondylosis. Mild bilateral gynecomastia. IMPRESSION: 1. Stable chest CT status post esophagectomy and gastric pull-through procedure. 2. No evidence of local recurrence or metastatic disease. 3. Stable small pulmonary nodules, likely benign. 4.  Aortic Atherosclerosis (ICD10-I70.0). Electronically Signed   By: Elsie Perone M.D.   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 Required esophageal dilation April 2024. Having persistent/worsening dysphagia, reflux/regurgitation. Consult for esophagram for further evaluation. EXAM: ESOPHAGUS/BARIUM SWALLOW/TABLET STUDY TECHNIQUE: Single contrast examination was performed using thin liquid barium. This exam was performed by Kimble Clas, PA-C, and was supervised and interpreted by Dr. Jenna. FLUOROSCOPY: Radiation Exposure Index (as provided by the fluoroscopic device): 24.80 mGy Kerma COMPARISON:  None Available. FINDINGS: Swallowing: Appears normal. No vestibular penetration or aspiration seen. Pharynx: Unremarkable. Esophagus: Postoperative changes seen. Gastroesophageal anastomosis appears patent with mild narrowing but no restriction of contrast passing Esophageal motility: Normal passage of contrast from upper esophagus through gastroesophageal anastomosis. Normal passage of contrast from distal stomach into small bowel. Hiatal Hernia: Unable to assess due to gastroesophageal anastomosis. Gastroesophageal reflux: None visualized. Ingested 13 mm barium tablet: Passed normally from proximal esophagus into the stomach via gastroesophageal anastomosis Other: None. IMPRESSION: Mild persistent focal ring-like narrowing of the gastroesophageal anastomosis without limitation of contrast passage and normal passage of 13 mm barium tablet, overall improved when compared to the study from 07/12/2017. Electronically Signed   By: Cordella Jenna   On: 06/09/2024 17:24    Impression/Plan:    1) Head and Neck Cancer Status: Stable. Most recent CT scans show no evidence of disease recurrence. We personally reviewed these images and patient was very pleased with the  results.   2) Nutritional Status: Overall he is doing quite well  Wt Readings from Last 3 Encounters:  07/04/24 123 lb 9.6 oz (56.1 kg)  01/03/24 121 lb (54.9 kg)  01/02/24 120 lb 1.6 oz (54.5 kg)   He has avoided any  form of feeding tube.   3) Risk Factors: The patient has been educated about risk factors including alcohol and tobacco abuse; they understand that avoidance of alcohol and tobacco is important to prevent recurrences as well as other cancers   4) Swallowing: Functional.  He will continue to follow with Dr Darrell Cunningham; had recent esophageal swallowing study.  Note history of esophagectomy for previous esophageal cancer   5)  Thyroid  function: Will check annually with med/onc Lab Results  Component Value Date   TSH 4.490 01/02/2024   6)   Follow up with ENT and Med Onc  We will also enroll in survivorship clinic for annual follow-up (see in Nov 2026). I will see him back as needed. - Recommended annual low-dose CT scan of the chest for lung cancer screening to survivorship team.    On date of service, in total, I spent 30 minutes on this encounter. Patient was seen in person.    -----------------------------------  Lauraine Golden, MD

## 2024-07-07 ENCOUNTER — Other Ambulatory Visit: Payer: Self-pay

## 2024-07-07 DIAGNOSIS — Z87891 Personal history of nicotine dependence: Secondary | ICD-10-CM

## 2024-07-07 DIAGNOSIS — C01 Malignant neoplasm of base of tongue: Secondary | ICD-10-CM

## 2024-07-11 ENCOUNTER — Other Ambulatory Visit (HOSPITAL_COMMUNITY): Payer: Self-pay

## 2024-07-15 ENCOUNTER — Other Ambulatory Visit: Payer: Self-pay | Admitting: Family Medicine

## 2024-08-24 ENCOUNTER — Other Ambulatory Visit: Payer: Self-pay | Admitting: Family Medicine

## 2024-08-24 DIAGNOSIS — K219 Gastro-esophageal reflux disease without esophagitis: Secondary | ICD-10-CM

## 2024-08-25 NOTE — Telephone Encounter (Signed)
 Requested medication (s) are due for refill today - expired Rx  Requested medication (s) are on the active medication list -yes  Future visit scheduled -no  Last refill: 09/18/22 #180 3RF  Notes to clinic: expired Rx- sent for review   Requested Prescriptions  Pending Prescriptions Disp Refills   pantoprazole  (PROTONIX ) 40 MG tablet [Pharmacy Med Name: PANTOPRAZOLE  SOD DR 40 MG TAB] 180 tablet 3    Sig: TAKE 1 TABLET BY MOUTH TWICE A DAY     Gastroenterology: Proton Pump Inhibitors Passed - 08/25/2024  4:18 PM      Passed - Valid encounter within last 12 months    Recent Outpatient Visits           4 months ago Need for vaccination   [place] [facility] Duanne Butler DASEN, MD   7 months ago General medical exam   [place] [facility] Duanne Butler DASEN, MD   1 year ago Seizure disorder [facility])   [facility] Duanne, Butler DASEN, MD   2 years ago Intertrigo   [place] [facility] Pickard, Butler DASEN, MD                 Requested Prescriptions  Pending Prescriptions Disp Refills   pantoprazole  (PROTONIX ) 40 MG tablet [Pharmacy Med Name: PANTOPRAZOLE  SOD DR 40 MG TAB] 180 tablet 3    Sig: TAKE 1 TABLET BY MOUTH TWICE A DAY     Gastroenterology: Proton Pump Inhibitors Passed - 08/25/2024  4:18 PM      Passed - Valid encounter within last 12 months    Recent Outpatient Visits           4 months ago Need for vaccination   [place] [facility] Duanne Butler DASEN, MD   7 months ago General medical exam   [place] [facility] Duanne Butler DASEN, MD   1 year ago Seizure disorder [facility])   [place] [facility] Duanne Butler DASEN, MD   2 years ago Intertrigo   [place] [facility] Pickard, Butler DASEN, MD

## 2025-01-02 ENCOUNTER — Inpatient Hospital Stay: Admitting: Hematology and Oncology

## 2025-06-10 ENCOUNTER — Inpatient Hospital Stay: Admitting: Adult Health
